# Patient Record
Sex: Male | Born: 1935
Health system: Southern US, Community
[De-identification: ages and names within clinical notes are randomized; demographics above are authoritative.]

## PROBLEM LIST (undated history)

## (undated) DIAGNOSIS — I10 Essential (primary) hypertension: Secondary | ICD-10-CM

## (undated) DIAGNOSIS — M1990 Unspecified osteoarthritis, unspecified site: Secondary | ICD-10-CM

## (undated) DIAGNOSIS — G20A1 Parkinson's disease without dyskinesia, without mention of fluctuations: Secondary | ICD-10-CM

## (undated) DIAGNOSIS — G4730 Sleep apnea, unspecified: Secondary | ICD-10-CM

## (undated) DIAGNOSIS — I959 Hypotension, unspecified: Secondary | ICD-10-CM

## (undated) DIAGNOSIS — G20 Parkinson's disease: Secondary | ICD-10-CM

## (undated) DIAGNOSIS — Z87442 Personal history of urinary calculi: Secondary | ICD-10-CM

## (undated) HISTORY — DX: Essential (primary) hypertension: I10

## (undated) HISTORY — PX: BACK SURGERY: SHX140

## (undated) HISTORY — PX: COLONOSCOPY: SHX174

## (undated) HISTORY — PX: ARM NEUROPLASTY: SHX1184

## (undated) HISTORY — PX: CARPAL TUNNEL RELEASE: SHX101

## (undated) HISTORY — PX: SHOULDER SURGERY: SHX246

---

## 1998-02-28 ENCOUNTER — Encounter: Admission: RE | Admit: 1998-02-28 | Discharge: 1998-05-29 | Payer: Self-pay | Admitting: *Deleted

## 2009-06-01 ENCOUNTER — Ambulatory Visit: Payer: Self-pay | Admitting: Diagnostic Radiology

## 2009-06-01 ENCOUNTER — Ambulatory Visit (HOSPITAL_BASED_OUTPATIENT_CLINIC_OR_DEPARTMENT_OTHER): Admission: RE | Admit: 2009-06-01 | Discharge: 2009-06-01 | Payer: Self-pay | Admitting: Orthopaedic Surgery

## 2009-10-06 ENCOUNTER — Inpatient Hospital Stay (HOSPITAL_COMMUNITY): Admission: RE | Admit: 2009-10-06 | Discharge: 2009-10-14 | Payer: Self-pay | Admitting: Neurosurgery

## 2009-10-10 ENCOUNTER — Ambulatory Visit: Payer: Self-pay | Admitting: Physical Medicine & Rehabilitation

## 2009-12-21 ENCOUNTER — Ambulatory Visit: Payer: Self-pay | Admitting: Radiology

## 2009-12-21 ENCOUNTER — Ambulatory Visit (HOSPITAL_BASED_OUTPATIENT_CLINIC_OR_DEPARTMENT_OTHER): Admission: RE | Admit: 2009-12-21 | Discharge: 2009-12-21 | Payer: Self-pay | Admitting: Neurosurgery

## 2010-05-25 ENCOUNTER — Ambulatory Visit (HOSPITAL_BASED_OUTPATIENT_CLINIC_OR_DEPARTMENT_OTHER): Admission: RE | Admit: 2010-05-25 | Discharge: 2010-05-25 | Payer: Self-pay | Admitting: Neurosurgery

## 2010-05-25 ENCOUNTER — Ambulatory Visit: Payer: Self-pay | Admitting: Diagnostic Radiology

## 2010-07-20 ENCOUNTER — Inpatient Hospital Stay (HOSPITAL_COMMUNITY): Admission: RE | Admit: 2010-07-20 | Discharge: 2010-07-26 | Payer: Self-pay | Admitting: Neurosurgery

## 2010-08-29 ENCOUNTER — Ambulatory Visit: Payer: Self-pay | Admitting: Pulmonary Disease

## 2010-08-29 DIAGNOSIS — I10 Essential (primary) hypertension: Secondary | ICD-10-CM | POA: Insufficient documentation

## 2010-08-29 HISTORY — DX: Essential (primary) hypertension: I10

## 2010-09-14 ENCOUNTER — Encounter: Payer: Self-pay | Admitting: Pulmonary Disease

## 2010-09-14 ENCOUNTER — Ambulatory Visit (HOSPITAL_BASED_OUTPATIENT_CLINIC_OR_DEPARTMENT_OTHER)
Admission: RE | Admit: 2010-09-14 | Discharge: 2010-09-14 | Payer: Self-pay | Source: Home / Self Care | Attending: Pulmonary Disease | Admitting: Pulmonary Disease

## 2010-09-24 DIAGNOSIS — G4730 Sleep apnea, unspecified: Secondary | ICD-10-CM

## 2010-09-24 HISTORY — DX: Sleep apnea, unspecified: G47.30

## 2010-09-30 ENCOUNTER — Telehealth: Payer: Self-pay | Admitting: Pulmonary Disease

## 2010-09-30 DIAGNOSIS — G4733 Obstructive sleep apnea (adult) (pediatric): Secondary | ICD-10-CM

## 2010-09-30 HISTORY — DX: Obstructive sleep apnea (adult) (pediatric): G47.33

## 2010-10-02 ENCOUNTER — Encounter: Payer: Self-pay | Admitting: Pulmonary Disease

## 2010-10-11 ENCOUNTER — Ambulatory Visit (HOSPITAL_BASED_OUTPATIENT_CLINIC_OR_DEPARTMENT_OTHER)
Admission: RE | Admit: 2010-10-11 | Discharge: 2010-10-11 | Payer: Self-pay | Source: Home / Self Care | Attending: Orthopaedic Surgery | Admitting: Orthopaedic Surgery

## 2010-10-16 ENCOUNTER — Encounter: Payer: Self-pay | Admitting: Pulmonary Disease

## 2010-10-17 ENCOUNTER — Ambulatory Visit: Admit: 2010-10-17 | Payer: Self-pay | Admitting: Pulmonary Disease

## 2010-10-19 ENCOUNTER — Encounter: Payer: Self-pay | Admitting: Pulmonary Disease

## 2010-10-20 LAB — SURGICAL PCR SCREEN
MRSA, PCR: NEGATIVE
Staphylococcus aureus: NEGATIVE

## 2010-10-20 LAB — COMPREHENSIVE METABOLIC PANEL
ALT: 16 U/L (ref 0–53)
AST: 17 U/L (ref 0–37)
Albumin: 3.7 g/dL (ref 3.5–5.2)
Alkaline Phosphatase: 71 U/L (ref 39–117)
BUN: 13 mg/dL (ref 6–23)
CO2: 30 mEq/L (ref 19–32)
Calcium: 9.3 mg/dL (ref 8.4–10.5)
Chloride: 102 mEq/L (ref 96–112)
Creatinine, Ser: 1.12 mg/dL (ref 0.4–1.5)
GFR calc Af Amer: >60 mL/min (ref >60)
GFR calc non Af Amer: >60 mL/min (ref >60)
Glucose, Bld: 91 mg/dL (ref 70–99)
Potassium: 3.9 mEq/L (ref 3.5–5.1)
Sodium: 141 mEq/L (ref 135–145)
Total Bilirubin: 0.4 mg/dL (ref 0.3–1.2)
Total Protein: 6.1 g/dL (ref 6.0–8.3)

## 2010-10-20 LAB — CBC
HCT: 39 % (ref 39.0–52.0)
Hemoglobin: 12.5 g/dL — ABNORMAL LOW (ref 13.0–17.0)
MCH: 29.3 pg (ref 26.0–34.0)
MCHC: 32.1 g/dL (ref 30.0–36.0)
MCV: 91.5 fL (ref 78.0–100.0)
Platelets: 157 10*3/uL (ref 150–400)
RBC: 4.26 MIL/uL (ref 4.22–5.81)
RDW: 14.3 % (ref 11.5–15.5)
WBC: 6.1 10*3/uL (ref 4.0–10.5)

## 2010-10-20 LAB — URINALYSIS, ROUTINE W REFLEX MICROSCOPIC
Bilirubin Urine: NEGATIVE
Hgb urine dipstick: NEGATIVE
Ketones, ur: NEGATIVE mg/dL
Nitrite: NEGATIVE
Protein, ur: NEGATIVE mg/dL
Specific Gravity, Urine: 1.019 (ref 1.005–1.030)
Urine Glucose, Fasting: NEGATIVE mg/dL
Urobilinogen, UA: 0.2 mg/dL (ref 0.0–1.0)
pH: 6 (ref 5.0–8.0)

## 2010-10-20 LAB — DIFFERENTIAL
Basophils Absolute: 0 10*3/uL (ref 0.0–0.1)
Basophils Relative: 1 % (ref 0–1)
Eosinophils Absolute: 0.2 10*3/uL (ref 0.0–0.7)
Eosinophils Relative: 3 % (ref 0–5)
Lymphocytes Relative: 25 % (ref 12–46)
Lymphs Abs: 1.5 10*3/uL (ref 0.7–4.0)
Monocytes Absolute: 0.5 10*3/uL (ref 0.1–1.0)
Monocytes Relative: 8 % (ref 3–12)
Neutro Abs: 3.9 10*3/uL (ref 1.7–7.7)
Neutrophils Relative %: 63 % (ref 43–77)

## 2010-10-20 LAB — URINE MICROSCOPIC-ADD ON

## 2010-10-20 LAB — PROTIME-INR
INR: 0.97 (ref 0.00–1.49)
Prothrombin Time: 13.1 seconds (ref 11.6–15.2)

## 2010-10-20 LAB — APTT: aPTT: 30 seconds (ref 24–37)

## 2010-10-24 ENCOUNTER — Ambulatory Visit (HOSPITAL_COMMUNITY)
Admission: RE | Admit: 2010-10-24 | Discharge: 2010-10-25 | Disposition: A | Discharge status: Home / Self Care | Payer: MEDICARE | Attending: Orthopaedic Surgery | Admitting: Orthopaedic Surgery

## 2010-10-24 DIAGNOSIS — I10 Essential (primary) hypertension: Secondary | ICD-10-CM | POA: Insufficient documentation

## 2010-10-24 DIAGNOSIS — M25819 Other specified joint disorders, unspecified shoulder: Secondary | ICD-10-CM | POA: Insufficient documentation

## 2010-10-24 DIAGNOSIS — G4733 Obstructive sleep apnea (adult) (pediatric): Secondary | ICD-10-CM | POA: Insufficient documentation

## 2010-10-24 DIAGNOSIS — M19019 Primary osteoarthritis, unspecified shoulder: Secondary | ICD-10-CM | POA: Insufficient documentation

## 2010-10-24 DIAGNOSIS — M67919 Unspecified disorder of synovium and tendon, unspecified shoulder: Secondary | ICD-10-CM | POA: Insufficient documentation

## 2010-10-24 DIAGNOSIS — M719 Bursopathy, unspecified: Secondary | ICD-10-CM | POA: Insufficient documentation

## 2010-10-24 NOTE — Assessment & Plan Note (Signed)
Summary: sleep consult/LC   Visit Type:  Initial Consult Copy to:  Dr. Maeola Harman  Primary Provider/Referring Provider:  Dr. Catha Gosselin  CC:  Sleep consult.  History of Present Illness: 77/M, hypertensive, chronic back pain for evaluation of obstructive sleep apnea  dr Venetia Maxon witnessed apneas post -op , he still takes hydromorphone 2 mg q 6h as needed inlcuding at bedtime, Lyrica  & amitryptiline for insomnia - has taken this for many yrs , He has just started on methacarbamol , did not tolerate flexeril Epworth Sleepiness Score 6 Takes afternoon nap on occasion x 40 minsto rest his back bedtime is 10-30 to MN, slep latency few mins, wakes up 2-3 times , BR x1, wakes up 0800 , unrefreshed , no dry mouth. Wife has noted snoring  apneas on few occasions  wt down 12 lbs  Preventive Screening-Counseling & Management  Alcohol-Tobacco     Alcohol drinks/day: 2-4     Alcohol type: all     Smoking Status: quit     Year Quit: 1980     Pipe use/week: 2-4   History of Present Illness: Did not know that I had one  What time do you typically go to bed?(between what hours): 10:30-12:00  How long does it take you to fall asleep? usually just a few minutes  How many times during the night do you wake up? maybe 2-3 times  What time do you get out of bed to start your day? 8:00-9:30a  Do you drive or operate heavy machinery in your occupation? no  How much has your weight changed (up or down) over the past two years? (in pounds): 12 lb decrease  Have you ever had a sleep study before?  If yes,when and where: no  Do you currently use CPAP ? If so , at what pressure? no  Do you wear oxygen at any time? If yes, how many liters per minute? no Current Medications (verified): 1)  Lyrica 150 Mg Caps (Pregabalin) .... Take 1 Capsule By Mouth Two Times A Day 2)  Amitriptyline Hcl 100 Mg Tabs (Amitriptyline Hcl) .... Take 1 Tab By Mouth At Bedtime 3)  Simvastatin 40 Mg Tabs (Simvastatin) ....  Take 1 Tab By Mouth At Bedtime 4)  Doxazosin Mesylate 4 Mg Tabs (Doxazosin Mesylate) .... Take 1 Tab By Mouth At Bedtime 5)  Fish Oil 1000 Mg Caps (Omega-3 Fatty Acids) .... Take 1 Tablet By Mouth Three Times A Day 6)  B Complex  Tabs (B Complex Vitamins) .... Take 1 Tablet By Mouth Once A Day 7)  Vitamin B-6 100 Mg Tabs (Pyridoxine Hcl) .... Take 1 Tablet By Mouth Once A Day 8)  L-Lysine 500 Mg Tabs (Lysine) .... Take 1 Tablet By Mouth Once A Day 9)  Flaxseed Oil 1000 Mg Caps (Flaxseed (Linseed)) .... Take 1 Tablet By Mouth Once A Day 10)  Vitamin D 2000 Unit Tabs (Cholecalciferol) .... Take 1 Tablet By Mouth Once A Day 11)  Multivitamins   Tabs (Multiple Vitamin) .... Take 1 Tablet By Mouth Once A Day 12)  Aspirin 81 Mg  Tabs (Aspirin) .... Take 1 Tablet By Mouth Once A Day 13)  Saw Palmetto 80 Mg Caps (Saw Palmetto (Serenoa Repens)) .... Take 1 Tablet By Mouth Three Times A Day 14)  Stool Softener 100 Mg Caps (Docusate Sodium) .... Take 1 Tablet By Mouth Three Times A Day 15)  Perdiem Overnight Relief 15 Mg Tabs (Sennosides) .... Take 1 Tablet By Mouth Four Times  A Day 16)  Cinnamon 500 Mg Caps (Cinnamon) .... Take 1 Tablet By Mouth Once A Day 17)  Hydromorphone Hcl 2 Mg Tabs (Hydromorphone Hcl) .... Take 1 Tablet By Mouth Two Times A Day 18)  Methocarbamol 750 Mg Tabs (Methocarbamol) .... Take 1 Tablet By Mouth Every 6 Hours As Needed 19)  Black Cherry 250mg  .... Take 1 Tablet By Mouth Once A Day  Allergies (verified): No Known Drug Allergies  Past History:  Past Medical History: Hypertension  Past Surgical History: Back surgery  Family History: Cancer  Social History: Marital Status: married Children: yes Occupation: Retired from [employer] in 1992 Patient states former smoker. (pipe smoker quit 1980) Smoking Status:  quit Pipe use/week:  2-4 Alcohol drinks/day:  2-4  Review of Systems       The patient complains of hand/feet swelling and joint stiffness or pain.  The  patient denies shortness of breath with activity, shortness of breath at rest, productive cough, non-productive cough, coughing up blood, chest pain, irregular heartbeats, acid heartburn, indigestion, loss of appetite, weight change, abdominal pain, difficulty swallowing, sore throat, tooth/dental problems, headaches, nasal congestion/difficulty breathing through nose, sneezing, itching, ear ache, anxiety, depression, rash, change in color of mucus, and fever.    Vital Signs:  Patient profile:   77 year old male Height:      70.5 inches Weight:      183 pounds BMI:     25.98 O2 Sat:      100 % on Room air Temp:     98.6 degrees F oral Pulse rate:   82 / minute BP sitting:   102 / 74  (left arm) Cuff size:   regular  Vitals Entered By: Zackery Barefoot CMA (August 29, 2010 2:05 PM)  O2 Flow:  Room air CC: Sleep consult Comments Medications reviewed with patient Verified contact number and pharmacy with patient Zackery Barefoot CMA  August 29, 2010 2:06 PM    Physical Exam  Additional Exam:  Gen. Pleasant, well-nourished, in no distress, normal affect ENT - no lesions, no post nasal drip, class 2 airway Neck: No JVD, no thyromegaly, no carotid bruits Lungs: no use of accessory muscles, no dullness to percussion, clear without rales or rhonchi  Cardiovascular: Rhythm regular, heart sounds  normal, no murmurs or gallops, no peripheral edema Abdomen: soft and non-tender, no hepatosplenomegaly, BS normal. Musculoskeletal: No deformities, no cyanosis or clubbing Neuro:  alert, non focal     Impression & Recommendations:  Problem # 1:  SLEEP DISORDER (ICD-780.50) Due to witnessed apneas durign hospital stay & by wife, will proceed with PSG Concern here is for central apneas due to narcotics & age related sleep disordered breathing The pathophysiology of obstructive sleep apnea, it's cardiovascular consequences and modes of treatment including CPAP were discussed with the patient  in great detail.  Orders: Consultation Level III (09811) Sleep Disorder Referral (Sleep Disorder)  Medications Added to Medication List This Visit: 1)  Lyrica 150 Mg Caps (Pregabalin) .... Take 1 capsule by mouth two times a day 2)  Amitriptyline Hcl 100 Mg Tabs (Amitriptyline hcl) .... Take 1 tab by mouth at bedtime 3)  Simvastatin 40 Mg Tabs (Simvastatin) .... Take 1 tab by mouth at bedtime 4)  Doxazosin Mesylate 4 Mg Tabs (Doxazosin mesylate) .... Take 1 tab by mouth at bedtime 5)  Fish Oil 1000 Mg Caps (Omega-3 fatty acids) .... Take 1 tablet by mouth three times a day 6)  B Complex Tabs (B complex  vitamins) .... Take 1 tablet by mouth once a day 7)  Vitamin B-6 100 Mg Tabs (Pyridoxine hcl) .... Take 1 tablet by mouth once a day 8)  L-lysine 500 Mg Tabs (Lysine) .... Take 1 tablet by mouth once a day 9)  Flaxseed Oil 1000 Mg Caps (Flaxseed (linseed)) .... Take 1 tablet by mouth once a day 10)  Vitamin D 2000 Unit Tabs (Cholecalciferol) .... Take 1 tablet by mouth once a day 11)  Multivitamins Tabs (Multiple vitamin) .... Take 1 tablet by mouth once a day 12)  Aspirin 81 Mg Tabs (Aspirin) .... Take 1 tablet by mouth once a day 13)  Saw Palmetto 80 Mg Caps (Saw palmetto (serenoa repens)) .... Take 1 tablet by mouth three times a day 14)  Stool Softener 100 Mg Caps (Docusate sodium) .... Take 1 tablet by mouth three times a day 15)  Perdiem Overnight Relief 15 Mg Tabs (Sennosides) .... Take 1 tablet by mouth four times a day 16)  Cinnamon 500 Mg Caps (Cinnamon) .... Take 1 tablet by mouth once a day 17)  Hydromorphone Hcl 2 Mg Tabs (Hydromorphone hcl) .... Take 1 tablet by mouth two times a day 18)  Methocarbamol 750 Mg Tabs (Methocarbamol) .... Take 1 tablet by mouth every 6 hours as needed 19)  Black Cherry 250mg   .... Take 1 tablet by mouth once a day  Patient Instructions: 1)  Copy sent to: Dr Venetia Maxon, Dr Clarene Duke  2)  Please schedule a follow-up appointment in 2 weeks after sleep  study   Immunization History:  Influenza Immunization History:    Influenza:  historical (05/29/2010)  Pneumovax Immunization History:    Pneumovax:  historical (08/26/2006)

## 2010-10-25 ENCOUNTER — Encounter: Payer: Self-pay | Admitting: Orthopaedic Surgery

## 2010-10-26 NOTE — Progress Notes (Addendum)
Summary: CPAP Auto  Phone Note Outgoing Call   Reason for Call: Discuss lab or test results Summary of Call: PSG showed severe obstructive sleep apnea - stopped breathing 40 /hr, Rx for auto cpap has been sent. further discussion on fu visit Initial call taken by: Comer Locket. Vassie Loll MD,  September 30, 2010 10:06 PM  Follow-up for Phone Call        lmomtcb x 1. Zackery Barefoot [facility]  October 03, 2010 10:31 AM  lmomtcb x 2 Zackery Barefoot [facility]  October 12, 2010 11:29 AM   Informed pt of above. Pt stated since he had not heard from "[place]" he declined to be set up. [facility] can you guys resend it or do we need a new Rx for the auto? Thanks. Zackery Barefoot CMA  October 12, 2010 3:08 PM   Additional Follow-up for Phone Call Additional follow up Details #1::        spoke to pt he says he never spoke to dr Vassie Loll about his sleep study i explained everything to him and he is now going to let [facility] set up cpap and will flu with dr Vassie Loll 11/30/10 Additional Follow-up by: Oneita Jolly,  October 12, 2010 4:01 PM  New Problems: OBSTRUCTIVE SLEEP APNEA (ICD-327.23)   Additional Follow-up for Phone Call Additional follow up Details #2::    In general, pl do not send Rx for CPAP until phone note mentions pt has been informed about PSg results Follow-up by: Comer Locket. Vassie Loll MD,  October 12, 2010 11:14 PM  New Problems: OBSTRUCTIVE SLEEP APNEA (ICD-327.23)  Appended Document: CPAP Auto dr Vassie Loll in the future just so you will know when the order goes into pcc box we have now way of knowing if the pt has been informed by your nurse or not so we might want to havbe her put the order inthe pcc box after she has spoken to the pt, because we do our ours daily in order to be able to keep up with so many docs orders

## 2010-10-26 NOTE — Miscellaneous (Signed)
Summary: Insurance/[employer]  Insurance/[employer]   Imported By: Lester [place] 10/20/2010 09:06:28  _____________________________________________________________________  External Attachment:    Type:   Image     Comment:   External Document

## 2010-11-01 ENCOUNTER — Ambulatory Visit: Payer: MEDICARE | Attending: Orthopaedic Surgery | Admitting: Physical Therapy

## 2010-11-01 DIAGNOSIS — M25619 Stiffness of unspecified shoulder, not elsewhere classified: Secondary | ICD-10-CM | POA: Insufficient documentation

## 2010-11-01 DIAGNOSIS — IMO0001 Reserved for inherently not codable concepts without codable children: Secondary | ICD-10-CM | POA: Insufficient documentation

## 2010-11-01 DIAGNOSIS — R293 Abnormal posture: Secondary | ICD-10-CM | POA: Insufficient documentation

## 2010-11-01 DIAGNOSIS — M25519 Pain in unspecified shoulder: Secondary | ICD-10-CM | POA: Insufficient documentation

## 2010-11-01 NOTE — Miscellaneous (Signed)
Summary: Set up CPAP/[facility]  Set up CPAP/[facility]   Imported By: Lester [place] 10/26/2010 08:52:02  _____________________________________________________________________  External Attachment:    Type:   Image     Comment:   External Document

## 2010-11-03 ENCOUNTER — Ambulatory Visit: Payer: MEDICARE | Admitting: Physical Therapy

## 2010-11-03 NOTE — Op Note (Signed)
[name], [name]               ACCOUNT NO.:  [id number]  MEDICAL RECORD NO.:  [id number]          PATIENT TYPE:  OIB  LOCATION:  5020                         FACILITY:  [facility]  PHYSICIAN:  Claude Manges. Whitfield, M.D.DATE OF BIRTH:  1932/11/24  DATE OF PROCEDURE:  10/24/2010 DATE OF DISCHARGE:                              OPERATIVE REPORT   PREOPERATIVE DIAGNOSES: 1. Impingement syndrome of left shoulder with partial rotator cuff     tear. 2. Tear of biceps tendon. 3. Degenerative arthrosis of acromioclavicular joint.  POSTOPERATIVE DIAGNOSIS: 1. Impingement syndrome of left shoulder with partial rotator cuff     tear. 2. Tear of biceps tendon. 3. Degenerative arthrosis of acromioclavicular joint.  PROCEDURES: 1. Diagnostic arthroscopy of left shoulder with debridement of     glenohumeral joint. 2. Arthroscopic subacromial decompression. 3. Arthroscopic distal clavicle resection. 4. Open biceps tenodesis.  SURGEON:  Claude Manges. Cleophas Dunker, MD  ASSISTANT:  Oris Drone. Petrarca, PA-C  ANESTHESIA:  General with supplemental interscalene nerve block.  COMPLICATIONS:  None.  HISTORY:  [name] was evaluated approximately a month ago with a 2- to 69-month history of left shoulder pain.  He has had some difficulty raising his arm overhead associated with popping and catching.  He has had a prior right shoulder debridement and was concerned that he may have developed a rotator cuff tear with significant problem and accordingly, he visited the office.  Because of his previous problem, we ordered an MRI scan.  Scan revealed rotator cuff tendinopathy with a partial-thickness articular surface tear of the subscapularis, biceps tendinopathy with longitudinal tearing, a small shoulder joint effusion with a subacromial bursitis and moderate degenerative changes of the AC joint.  He wishes to proceed with arthroscopic evaluation given the past history with his right  shoulder.  PROCEDURE:  Mr. [name] was met in the holding area.  I marked the left shoulder as the appropriate operative site.  He did receive a preoperative interscalene nerve block.  Mr. [name] was then transported to room #1 and placed under general orotracheal anesthesia, he did receive a preoperative interscalene nerve block.  The left shoulder was then evaluated without evidence of instability or adhesive capsulitis.  He does have a fixed flexure contracture of his left elbow from a prior injury.  He was then placed in a semi-sitting position with the shoulder frame.  The left upper extremities were then prepped with DuraPrep and the base of neck circumferentially, and below the elbow.  Sterile draping was performed.  A marking pen was used to outline the [facility] joint, the coracoid, and the acromion.  At a point a fingerbreadth posteromedial to the posterior angle of acromion, a small stab wound was made.  The arthroscope was easily placed in the shoulder joint.  Diagnostic arthroscopy revealed diffuse synovitis.  There was some partial tearing of the subscapularis and tearing of the biceps tendon that seemed to involve at least 50% of the tendon.  Second portal was established anteriorly and shaving of the synovitis was performed with the ArthroCare wand.  There were some degenerative changes of the humeral head that were debrided with a  Cuda shaver.  There appeared to be grade 2 changes although the surface of the head was irregular in several areas.  The arthroscope was then placed in subacromial space posteriorly, the cannula in subacromial space anteriorly, and a third portal established in lateral subacromial space and arthroscopic subacromial decompression was performed removing considerable inflammatory bursal material.  There was obvious anterior overhang of the acromion and an anterior acromioplasty was performed.  There was some overhang laterally which was debrided as  well.  There was considerable osteophyte formation at the [facility] joint of both sides.  I removed the osteophyte from the acromion so I could better visualize the end of the clavicle and there was little if any articular cartilage and any degenerative capsule.  A distal clavicle resection was then performed with a 6-mm bur with a nice resection.  Any synovitis was removed with the ArthroCare wand.  I did not see an obvious rotator cuff tear.  Based on the appearance of the biceps tendon and given the MRI scan results of biceps, open biceps tenodesis was performed.  A little over an inch incision was made along the anterior aspect of the shoulder in line of the biceps tendon.  Via sharp dissection, incision was carried down to subcutaneous tissue.  Gross bleeders were Bovie coagulated. Deltoid fascia was then incised and separated and maintained with a self- retaining retractor.  The biceps tendon groove was identified, it was incised with the Bovie.  The tendon was then delivered through the wound, was quite thick and no obvious longitudinal tears.  Suture was tagged at its normal length and then removed from its attachment to the superior aspect of the glenoid.  A single 5.5-mm Arthrex PEEK anchor was then inserted into the inferior aspect of the biceps groove.  Two FiberWires were then placed through the tendon and then secured into the groove.  The sutures were incised with the knife and then the remaining redundant biceps tendon was incised with a knife.  I supplemented the repair with 0 Vicryl suture through the retinaculum and through the tendon.  The remaining soft tissue retinaculum was closed with running #1 Vicryl.  I did evaluate the rotator cuff and it appeared to be intact.  The wound was then irrigated with saline solution.  The deltoid fascia closed with a running 0 Vicryl, subcu with 3-0 Monocryl, skin closed with Steri-Strips over Benzoin.  Sterile bulky dressing was  applied followed by a sling.  The patient tolerated the procedure without complications.  PLAN:  A 23-hour observation based on his history of sleep apnea. Discharge in a.m. on Dilaudid for pain.     Claude Manges. Cleophas Dunker, M.D.     PWW/MEDQ  D:  10/24/2010  T:  10/25/2010  Job:  [id number]  Electronically Signed by Norlene Campbell M.D. on 11/03/2010 01:30:32 PM

## 2010-11-07 ENCOUNTER — Ambulatory Visit: Payer: MEDICARE | Admitting: Physical Therapy

## 2010-11-10 ENCOUNTER — Ambulatory Visit: Payer: MEDICARE | Admitting: Physical Therapy

## 2010-11-14 ENCOUNTER — Ambulatory Visit: Payer: MEDICARE | Admitting: Physical Therapy

## 2010-11-14 NOTE — Discharge Summary (Signed)
  NAMEBALEN, Aaron Schmidt               ACCOUNT NO.:  [id number]  MEDICAL RECORD NO.:  [id number]          PATIENT TYPE:  INP  LOCATION:  3031                         FACILITY:  [facility]  PHYSICIAN:  Danae Orleans. Venetia Maxon, M.D.  DATE OF BIRTH:  21-Feb-1933  DATE OF ADMISSION:  07/20/2010 DATE OF DISCHARGE:  07/26/2010                              DISCHARGE SUMMARY   REASON FOR ADMISSION:  L3-4 pseudoarthrosis status post anterolateral fusion, spondylosis, scoliosis, and back pain.  FINAL DIAGNOSES:  L3-4 pseudoarthrosis status post anterolateral fusion, spondylosis, scoliosis, and back pain.  ADDITIONAL DIAGNOSES: 1. Delirium. 2. Obstructive sleep apnea.  HISTORY OF PRESENT ILLNESS AND HOSPITAL COURSE:  Aaron Schmidt is a 77- year-old man who is 10 months after anterolateral decompression and fusion at L3-4.  He has had a previous laminectomy posteriorly and fusion at L4-3 to sacrum and had continued back pain in his postoperative period was found on CT scan to have incomplete healing at the site of the previous surgery.  The patient was admitted to the hospital and underwent uncomplicated posterior pedicle screw fixation and posterolateral arthrodesis.  He had some back, right thigh, and bilateral thigh and bilateral knee pain which has gradually improved. He had postoperative confusion.  He did have some sleep apnea, and he was arranged to have a sleep study as an outpatient.  He was discharged home on his preoperative medications of docusate, aspirin 81 mg, multivitamin, vitamin D 2000 IU daily, vitamin B6, methocarbamol, oral hydromorphone for pain management.  The patient will need doxazosin 4 mg daily, Zocor 40 mg daily, amitriptyline 100 mg daily, Lyrica 150 mg twice daily.  Instructions were to follow up in the office in 3 weeks postoperatively.     Danae Orleans. Venetia Maxon, M.D.     JDS/MEDQ  D:  11/13/2010  T:  11/13/2010  Job:  [id number]  Electronically Signed by Maeola Harman M.D.  on 11/14/2010 04:51:53 PM

## 2010-11-16 ENCOUNTER — Encounter: Payer: MEDICARE | Admitting: Physical Therapy

## 2010-11-16 ENCOUNTER — Telehealth: Payer: Self-pay | Admitting: Pulmonary Disease

## 2010-11-17 ENCOUNTER — Encounter: Payer: MEDICARE | Admitting: Physical Therapy

## 2010-11-21 ENCOUNTER — Ambulatory Visit: Payer: MEDICARE | Admitting: Physical Therapy

## 2010-11-23 ENCOUNTER — Encounter: Payer: Self-pay | Admitting: Pulmonary Disease

## 2010-11-23 ENCOUNTER — Ambulatory Visit: Payer: MEDICARE | Attending: Orthopaedic Surgery | Admitting: Physical Therapy

## 2010-11-23 DIAGNOSIS — R293 Abnormal posture: Secondary | ICD-10-CM | POA: Insufficient documentation

## 2010-11-23 DIAGNOSIS — IMO0001 Reserved for inherently not codable concepts without codable children: Secondary | ICD-10-CM | POA: Insufficient documentation

## 2010-11-23 DIAGNOSIS — M25519 Pain in unspecified shoulder: Secondary | ICD-10-CM | POA: Insufficient documentation

## 2010-11-23 DIAGNOSIS — M25619 Stiffness of unspecified shoulder, not elsewhere classified: Secondary | ICD-10-CM | POA: Insufficient documentation

## 2010-11-28 ENCOUNTER — Encounter: Payer: MEDICARE | Admitting: Rehabilitation

## 2010-11-30 ENCOUNTER — Encounter: Payer: Self-pay | Admitting: Pulmonary Disease

## 2010-11-30 NOTE — Progress Notes (Signed)
  Phone Note Call from Patient   Caller: [facility] Call For: Aaron Schmidt Summary of Call: order for auto cpap was given 09/30/10- pt has informed them he has not been using the cpap because he had surgery on his shoulder and his arm was in a sling so he could not get the mask on good .he is now using it again and has an appt 12/04/10 do you want to move his appt further out to get the 4wk download or a shorter download? Initial call taken by: Oneita Jolly,  November 16, 2010 2:38 PM  Follow-up for Phone Call        shorter download OK - send in card 2 days prior to appt Follow-up by: Comer Locket. Vassie Loll MD,  November 18, 2010 8:00 PM  Additional Follow-up for Phone Call Additional follow up Details #1::        informed [email or web address]  to download 2day before appt  Additional Follow-up by: Oneita Jolly,  November 20, 2010 8:26 AM

## 2010-12-01 ENCOUNTER — Ambulatory Visit: Payer: MEDICARE | Admitting: Rehabilitation

## 2010-12-05 ENCOUNTER — Ambulatory Visit (INDEPENDENT_AMBULATORY_CARE_PROVIDER_SITE_OTHER): Payer: MEDICARE | Admitting: Pulmonary Disease

## 2010-12-05 ENCOUNTER — Encounter: Payer: Self-pay | Admitting: Pulmonary Disease

## 2010-12-05 ENCOUNTER — Ambulatory Visit: Payer: MEDICARE | Admitting: Rehabilitation

## 2010-12-05 DIAGNOSIS — G4733 Obstructive sleep apnea (adult) (pediatric): Secondary | ICD-10-CM

## 2010-12-06 LAB — CBC
HCT: 40.8 % (ref 39.0–52.0)
Hemoglobin: 13.3 g/dL (ref 13.0–17.0)
MCH: 30.9 pg (ref 26.0–34.0)
MCHC: 32.6 g/dL (ref 30.0–36.0)
MCV: 94.9 fL (ref 78.0–100.0)
Platelets: 145 10*3/uL — ABNORMAL LOW (ref 150–400)
RBC: 4.3 MIL/uL (ref 4.22–5.81)
RDW: 15.1 % (ref 11.5–15.5)
WBC: 5.1 10*3/uL (ref 4.0–10.5)

## 2010-12-06 LAB — BASIC METABOLIC PANEL
BUN: 15 mg/dL (ref 6–23)
CO2: 31 mEq/L (ref 19–32)
Calcium: 9.4 mg/dL (ref 8.4–10.5)
Chloride: 109 mEq/L (ref 96–112)
Creatinine, Ser: 1.22 mg/dL (ref 0.4–1.5)
GFR calc Af Amer: >60 mL/min (ref >60)
GFR calc non Af Amer: 58 mL/min — ABNORMAL LOW (ref >60)
Glucose, Bld: 90 mg/dL (ref 70–99)
Potassium: 5 mEq/L (ref 3.5–5.1)
Sodium: 144 mEq/L (ref 135–145)

## 2010-12-06 LAB — TYPE AND SCREEN
ABO/RH(D): A POS
Antibody Screen: NEGATIVE

## 2010-12-06 LAB — SURGICAL PCR SCREEN
MRSA, PCR: NEGATIVE
Staphylococcus aureus: NEGATIVE

## 2010-12-08 ENCOUNTER — Ambulatory Visit: Payer: MEDICARE | Admitting: Physical Therapy

## 2010-12-10 LAB — CBC
HCT: 40.2 % (ref 39.0–52.0)
Hemoglobin: 13.8 g/dL (ref 13.0–17.0)
MCHC: 34.5 g/dL (ref 30.0–36.0)
MCV: 97.7 fL (ref 78.0–100.0)
Platelets: 137 10*3/uL — ABNORMAL LOW (ref 150–400)
RBC: 4.11 MIL/uL — ABNORMAL LOW (ref 4.22–5.81)
RDW: 14.1 % (ref 11.5–15.5)
WBC: 5.8 10*3/uL (ref 4.0–10.5)

## 2010-12-10 LAB — BASIC METABOLIC PANEL
BUN: 14 mg/dL (ref 6–23)
CO2: 29 mEq/L (ref 19–32)
Calcium: 9.4 mg/dL (ref 8.4–10.5)
Chloride: 104 mEq/L (ref 96–112)
Creatinine, Ser: 1.09 mg/dL (ref 0.4–1.5)
GFR calc Af Amer: >60 mL/min (ref >60)
GFR calc non Af Amer: >60 mL/min (ref >60)
Glucose, Bld: 90 mg/dL (ref 70–99)
Potassium: 4 mEq/L (ref 3.5–5.1)
Sodium: 140 mEq/L (ref 135–145)

## 2010-12-10 LAB — TYPE AND SCREEN
ABO/RH(D): A POS
Antibody Screen: NEGATIVE

## 2010-12-10 LAB — ABO/RH: ABO/RH(D): A POS

## 2010-12-12 ENCOUNTER — Ambulatory Visit: Payer: MEDICARE | Admitting: Physical Therapy

## 2010-12-12 NOTE — Assessment & Plan Note (Signed)
Summary: return office visit   Visit Type:  Follow-up Copy to:  Dr. Maeola Harman  Primary Provider/Referring Provider:  Dr. Catha Gosselin  CC:  Pt here for OSA follow up. Pt c/o nasal congestion and nosebleeds. Unable to use machine often due to back and shoulder surgery.  History of Present Illness: 77/M, hypertensive, chronic back pain for evaluation of obstructive sleep apnea  dr Venetia Maxon witnessed apneas post -op , he still takes hydromorphone 2 mg q 6h as needed inlcuding at bedtime, Lyrica  & amitryptiline for insomnia - has taken this for many yrs , He has just started on methacarbamol , did not tolerate flexeril Epworth Sleepiness Score 6 Takes afternoon nap on occasion x 40 minsto rest his back Wife has noted snoring , apneas on few occasions  wt down 12 lbs  December 05, 2010 1:50 PM  c/o nosebleeds, change to nasal mask -received today Reviewed PSG (180 lbs ) >> severe obstructive sleep apnea with AHi 38/h & desatns to 82% Started on auto 5-15 pressure Ok, no dryness Download reviewed >> residual events of 6/h , avg pr 8 cm, leak + with nasal pillows   Preventive Screening-Counseling & Management  Alcohol-Tobacco     Alcohol drinks/day: 2-4     Alcohol type: all     Smoking Status: quit     Year Quit: 1980     Pipe use/week: 2-4  Current Medications (verified): 1)  Lyrica 150 Mg Caps (Pregabalin) .... Take 1 Capsule By Mouth Two Times A Day 2)  Amitriptyline Hcl 100 Mg Tabs (Amitriptyline Hcl) .... Take 1 Tab By Mouth At Bedtime 3)  Simvastatin 40 Mg Tabs (Simvastatin) .... Take 1 Tab By Mouth At Bedtime 4)  Doxazosin Mesylate 4 Mg Tabs (Doxazosin Mesylate) .... Take 1 Tab By Mouth At Bedtime 5)  Fish Oil 1000 Mg Caps (Omega-3 Fatty Acids) .... Take 1 Tablet By Mouth Three Times A Day 6)  B Complex  Tabs (B Complex Vitamins) .... Take 1 Tablet By Mouth Once A Day 7)  Vitamin B-6 100 Mg Tabs (Pyridoxine Hcl) .... Take 1 Tablet By Mouth Once A Day 8)  L-Lysine 500 Mg Tabs  (Lysine) .... Take 1 Tablet By Mouth Once A Day 9)  Flaxseed Oil 1000 Mg Caps (Flaxseed (Linseed)) .... Take 1 Tablet By Mouth Once A Day 10)  Vitamin D 2000 Unit Tabs (Cholecalciferol) .... Take 1 Tablet By Mouth Once A Day 11)  Multivitamins   Tabs (Multiple Vitamin) .... Take 1 Tablet By Mouth Once A Day 12)  Aspirin 81 Mg  Tabs (Aspirin) .... Take 1 Tablet By Mouth Once A Day 13)  Saw Palmetto 80 Mg Caps (Saw Palmetto (Serenoa Repens)) .... Take 1 Tablet By Mouth Three Times A Day 14)  Stool Softener 100 Mg Caps (Docusate Sodium) .... Take 1 Tablet By Mouth Three Times A Day 15)  Perdiem Overnight Relief 15 Mg Tabs (Sennosides) .... Take 1 Tablet By Mouth Four Times A Day 16)  Cinnamon 500 Mg Caps (Cinnamon) .... Take 1 Tablet By Mouth Once A Day 17)  Hydromorphone Hcl 2 Mg Tabs (Hydromorphone Hcl) .... Take 1 Tablet By Mouth Two Times A Day 18)  Methocarbamol 750 Mg Tabs (Methocarbamol) .... Take 1 Tablet By Mouth Every 6 Hours As Needed 19)  Black Cherry 250mg  .... Take 1 Tablet By Mouth Once A Day 20)  Mobic 7.5 Mg Tabs (Meloxicam) .... Take 1 Tablet By Mouth Two Times A Day  Allergies (verified): 1)  !  Valium  Past History:  Past Medical History: Last updated: 08/29/2010 Hypertension  Social History: Last updated: 08/29/2010 Marital Status: married Children: yes Occupation: Retired from [employer] in 1992 Patient states former smoker. (pipe smoker quit 1980)  Past Surgical History: Back surgery shoulder surgery-left  Review of Systems  The patient denies anorexia, fever, weight loss, weight gain, vision loss, decreased hearing, hoarseness, chest pain, syncope, dyspnea on exertion, peripheral edema, prolonged cough, headaches, hemoptysis, abdominal pain, melena, hematochezia, severe indigestion/heartburn, hematuria, muscle weakness, suspicious skin lesions, difficulty walking, depression, unusual weight change, abnormal bleeding, enlarged lymph nodes, and angioedema.    Vital  Signs:  Patient profile:   77 year old male Height:      70.5 inches Weight:      195.5 pounds BMI:     27.75 O2 Sat:      99 % on Room air Temp:     98.5 degrees F oral Pulse rate:   76 / minute BP sitting:   136 / 82  (right arm) Cuff size:   regular  Vitals Entered By: Zackery Barefoot CMA (December 05, 2010 1:38 PM)  O2 Flow:  Room air CC: Pt here for OSA follow up. Pt c/o nasal congestion and nosebleeds. Unable to use machine often due to back and shoulder surgery Comments Medications reviewed with patient Verified contact number and pharmacy with patient Zackery Barefoot CMA  December 05, 2010 1:41 PM    Physical Exam  Additional Exam:  Gen. Pleasant, well-nourished, in no distress, normal affect ENT - no lesions, no post nasal drip, class 2 airway Neck: No JVD, no thyromegaly, no carotid bruits Lungs: no use of accessory muscles, no dullness to percussion, clear without rales or rhonchi  Cardiovascular: Rhythm regular, heart sounds  normal, no murmurs or gallops, no peripheral edema Musculoskeletal: No deformities, no cyanosis or clubbing      Impression & Recommendations:  Problem # 1:  OBSTRUCTIVE SLEEP APNEA (ICD-327.23) Change to nasal mask , fixed pr 8 cm , rechk download in Compliance encouraged, wt loss emphasized, asked to avoid meds with sedative side effects, cautioned against driving when sleepy.  Orders: Est. Patient Level III (56213) DME Referral (DME)  Medications Added to Medication List This Visit: 1)  Mobic 7.5 Mg Tabs (Meloxicam) .... Take 1 tablet by mouth two times a day  Patient Instructions: 1)  Copy sent to: 2)  Please schedule a follow-up appointment in 1 month after you turn in your card 3)  We will change to a fixed pressure of 8 cm

## 2010-12-15 ENCOUNTER — Ambulatory Visit: Payer: MEDICARE | Admitting: Physical Therapy

## 2010-12-19 ENCOUNTER — Encounter: Payer: MEDICARE | Admitting: Physical Therapy

## 2010-12-22 ENCOUNTER — Encounter: Payer: MEDICARE | Admitting: Physical Therapy

## 2011-01-01 ENCOUNTER — Encounter: Payer: Self-pay | Admitting: Pulmonary Disease

## 2011-01-02 ENCOUNTER — Ambulatory Visit (INDEPENDENT_AMBULATORY_CARE_PROVIDER_SITE_OTHER): Payer: MEDICARE | Admitting: Pulmonary Disease

## 2011-01-02 ENCOUNTER — Encounter: Payer: Self-pay | Admitting: Pulmonary Disease

## 2011-01-02 VITALS — BP 142/78 | HR 83 | Temp 98.7°F | Ht 70.5 in | Wt 195.0 lb

## 2011-01-02 DIAGNOSIS — G4733 Obstructive sleep apnea (adult) (pediatric): Secondary | ICD-10-CM

## 2011-01-02 NOTE — Progress Notes (Signed)
  Subjective:    Patient ID: Aaron Schmidt, male    DOB: October 11, 1932, 77 y.o.   MRN: [id number]  HPI 77/M, hypertensive, chronic back pain for fu of obstructive sleep apnea  dr Venetia Maxon witnessed apneas post -op , he still takes hydromorphone 2 mg q 6h as needed inlcuding at bedtime, Lyrica  & amitryptiline for insomnia - has taken this for many yrs , He has just started on methacarbamol , did not tolerate flexeril Epworth Sleepiness Score 6 Takes afternoon nap on occasion x 40 minsto rest his back Wife has noted snoring , apneas on few occasions  wt down 12 lbs Reviewed PSG (180 lbs ) >> severe obstructive sleep apnea with [facility] 38/h & desatns to 82% Started on auto 5-15 march '12 pressure Ok, no dryness Download reviewed >> residual events of 6/h , avg pr 8 cm, leak + with nasal pillows  01/02/2011 Nasal breakdown, nasal pillows caused nose bleeds Download on 8 cm 3/15- 12/28/10 >> no residual events, good usage Unfortunately he does not report improvement in energy levels    Review of SystemsPt denies any significant  nasal congestion or excess secretions, fever, chills, sweats, unintended wt loss, pleuritic or exertional cp, orthopnea pnd or leg swelling.  Pt also denies any obvious fluctuation in symptoms with weather or environmental change or other alleviating or aggravating factors.    Pt denies any increase in rescue therapy over baseline, denies waking up needing it or having early am exacerbations or coughing/wheezing/ or dyspnea       Objective:   Physical ExamGen. Pleasant, well-nourished, in no distress ENT - no lesions, no post nasal drip Neck: No JVD, no thyromegaly, no carotid bruits Lungs: no use of accessory muscles, no dullness to percussion, clear without rales or rhonchi  Cardiovascular: Rhythm regular, heart sounds  normal, no murmurs or gallops, no peripheral edema Musculoskeletal: No deformities, no cyanosis or clubbing          Assessment & Plan:

## 2011-01-02 NOTE — Patient Instructions (Addendum)
Nasal pads Rx sent to [facility] - if they are unable to provide, pl contact [facility] Alternate pillows with nasal mask

## 2011-01-03 ENCOUNTER — Encounter: Payer: Self-pay | Admitting: Pulmonary Disease

## 2011-01-03 NOTE — Assessment & Plan Note (Signed)
He has had adjustment issues - bleding with pillows & nasal bridge skin breakdown with mask. Compliance is good as are control of events on cpap downloads, however he has not experienced subjective benefit & is discouraged by that. I have explained cardiac benefits of CPAP & asked him to persist. Nasal pad to avoid breakdown or alternate pillows with mask have been offered as solutions. Also feel that his narcotics & benzos do contribute to his daytime fatigue - he does seem to have chronic pain & needs this apparently Weight loss encouraged, compliance with goal of at least 4-6 hrs every night is the expectation. Advised against medications with sedative side effects Cautioned against driving when sleepy - understanding that sleepiness will vary on a day to day basis

## 2011-01-09 ENCOUNTER — Encounter: Payer: Self-pay | Admitting: Pulmonary Disease

## 2011-02-20 ENCOUNTER — Telehealth: Payer: Self-pay | Admitting: Pulmonary Disease

## 2011-02-20 DIAGNOSIS — G4730 Sleep apnea, unspecified: Secondary | ICD-10-CM

## 2011-02-20 NOTE — Telephone Encounter (Signed)
Libby or [place], pls help with this thanks

## 2011-02-20 NOTE — Telephone Encounter (Signed)
i'm not sure what this pt is talking about please explain to me could this be comfort pads

## 2011-02-21 NOTE — Telephone Encounter (Signed)
Spoke with pt- sounds like yes, needs comfort pads b/c he states that his his mask makes his nose sore and [facility] told him he would order pads to help better protect his nose.

## 2011-02-22 NOTE — Telephone Encounter (Signed)
Please place referral in pcc box for [facility] to provide comfort pads for CPAP. Thanks,

## 2011-02-22 NOTE — Telephone Encounter (Signed)
Order placed. Jennifer Castillo, CMA  

## 2011-03-13 ENCOUNTER — Ambulatory Visit: Payer: Medicare Other | Admitting: Pulmonary Disease

## 2011-03-13 ENCOUNTER — Ambulatory Visit (INDEPENDENT_AMBULATORY_CARE_PROVIDER_SITE_OTHER): Payer: Medicare Other | Admitting: Adult Health

## 2011-03-13 ENCOUNTER — Encounter: Payer: Self-pay | Admitting: Adult Health

## 2011-03-13 VITALS — BP 124/80 | HR 78 | Temp 98.9°F | Ht 70.5 in | Wt 195.0 lb

## 2011-03-13 DIAGNOSIS — G4733 Obstructive sleep apnea (adult) (pediatric): Secondary | ICD-10-CM

## 2011-03-13 NOTE — Patient Instructions (Addendum)
Use Saline nasal rinses and gel Twice daily   Use Nasal pad with mask over next week and if not helping we can look at another mask fitting with [facility].  Call if any more problems with mask.  Weight loss encouraged, compliance with goal of at least 4-6 hrs every night is the expectation.  Advised against medications with sedative side effects  Cautioned against driving when sleepy - understanding that sleepiness will vary on a day to day basis  We will  Call you in couple of weeks for download results from CPAP  follow up Dr. Vassie Loll  In 3 months

## 2011-03-19 NOTE — Assessment & Plan Note (Signed)
We discussed several stratigies to help with CPAP mask /compliance  Set up for download   Plan:  Use Saline nasal rinses and gel Twice daily   Use Nasal pad with mask over next week and if not helping we can look at another mask fitting with [facility].  Call if any more problems with mask.  Weight loss encouraged, compliance with goal of at least 4-6 hrs every night is the expectation.  Advised against medications with sedative side effects  Cautioned against driving when sleepy - understanding that sleepiness will vary on a day to day basis  We will  Call you in couple of weeks for download results from CPAP

## 2011-03-19 NOTE — Progress Notes (Signed)
Subjective:    Patient ID: Aaron Schmidt, male    DOB: 1933/01/25, 77 y.o.   MRN: [id number]  HPI  77/M, hypertensive, chronic back pain for fu of obstructive sleep apnea  dr Venetia Maxon witnessed apneas post -op , he still takes hydromorphone 2 mg q 6h as needed inlcuding at bedtime, Lyrica  & amitryptiline for insomnia - has taken this for many yrs , He has just started on methacarbamol , did not tolerate flexeril Epworth Sleepiness Score 6 Takes afternoon nap on occasion x 40 minsto rest his back Wife has noted snoring , apneas on few occasions  wt down 12 lbs Reviewed PSG (180 lbs ) >> severe obstructive sleep apnea with [facility] 38/h & desatns to 82% Started on auto 5-15 march '12 pressure Ok, no dryness Download reviewed >> residual events of 6/h , avg pr 8 cm, leak + with nasal pillows  01/02/2011 Nasal breakdown, nasal pillows caused nose bleeds Download on 8 cm 3/15- 12/28/10 >> no residual events, good usage Unfortunately he does not report improvement in energy levels  03/13/11 Follow up OV  Pt presents for OSA follow up .He continues to wear CPAP each night on average 4-6 hrs . However he still  Does not like the mask. Has not noticed a great change in his fatigue level and does not feel energetic in am.  We discussed the consequences of OSA on the body . Also he is on several pain meds that can contribute to  His energy and fatigue level. He continues to have difficulty with his mask, he has a nasal pad to help with nasal irritation but  Says the bridge of his nose still remains sore , also has imprint of mask on face for couple hours after mask is removed.      Review of Systems    Constitutional:   No  weight loss, night sweats,  Fevers, chills,  +fatigue, or  lassitude.  HEENT:   No headaches,  Difficulty swallowing,  Tooth/dental problems, or  Sore throat,                No sneezing, itching, ear ache, nasal congestion, post nasal drip,   CV:  No chest pain,  Orthopnea,  PND, swelling in lower extremities, anasarca, dizziness, palpitations, syncope.   GI  No heartburn, indigestion, abdominal pain, nausea, vomiting, diarrhea, change in bowel habits, loss of appetite, bloody stools.   Resp: No shortness of breath with exertion or at rest.  No excess mucus, no productive cough,  No non-productive cough,  No coughing up of blood.  No change in color of mucus.  No wheezing.  No chest wall deformity  Skin: no rash or lesions.  GU: no dysuria, change in color of urine, no urgency or frequency.  No flank pain, no hematuria   MS:  No joint   swelling.  No decreased range of motion.    Psych:  No change in mood or affect. No depression or anxiety.  No memory loss.       Objective:   Physical Exam Gen. Pleasant, well-nourished, in no distress ENT - no lesions, no post nasal drip,  Neck: No JVD, no thyromegaly, no carotid bruits Lungs: no use of accessory muscles, no dullness to percussion, clear without rales or rhonchi  Cardiovascular: Rhythm regular, heart sounds  normal, no murmurs or gallops, no peripheral edema Musculoskeletal: No deformities, no cyanosis or clubbing          Assessment &  Plan:

## 2012-03-24 ENCOUNTER — Telehealth: Payer: Self-pay | Admitting: Pulmonary Disease

## 2012-03-24 DIAGNOSIS — G4733 Obstructive sleep apnea (adult) (pediatric): Secondary | ICD-10-CM

## 2012-03-24 NOTE — Telephone Encounter (Signed)
LMTCB

## 2012-03-24 NOTE — Telephone Encounter (Signed)
Pl send order to dme to check his cpap

## 2012-03-24 NOTE — Telephone Encounter (Signed)
Spoke with pt. He states that he is already using the humidifier without heat. He has been checking his BP and it is normal to low, "has never been high". Pt seems confused about his machine to me. I told him to call DME if had questions about the CPAP and supplies and he states that they will not do anything without order from [place]. RA, please advise thanks

## 2012-03-24 NOTE — Telephone Encounter (Signed)
Spoke with pt. He states that he ordered a new hose for CPAP that was "heated" and since using this he has been waking up every am with terrible HA. He states that the DME co told him "how to adjust the heated hose" and this still did not work. He then decided to use the old hose but continues to have HA's. Sometimes the HA wakes him up out of his sleep. Please advise recs thanks

## 2012-03-24 NOTE — Telephone Encounter (Signed)
Use humidifier without heat if possible Check Bp Tylenol ok prn Trial of nasal spray(nasonex or flonase) each nare at bedtime  if this persists

## 2012-03-25 NOTE — Telephone Encounter (Signed)
Order placed for DME to check function of cpap. Pt is aware. Carron Curie, CMA

## 2012-09-11 ENCOUNTER — Other Ambulatory Visit (HOSPITAL_BASED_OUTPATIENT_CLINIC_OR_DEPARTMENT_OTHER): Payer: Self-pay | Admitting: Neurosurgery

## 2012-09-11 ENCOUNTER — Other Ambulatory Visit (HOSPITAL_BASED_OUTPATIENT_CLINIC_OR_DEPARTMENT_OTHER): Payer: Self-pay | Admitting: Anesthesiology

## 2012-09-11 DIAGNOSIS — M5410 Radiculopathy, site unspecified: Secondary | ICD-10-CM

## 2012-09-11 DIAGNOSIS — M549 Dorsalgia, unspecified: Secondary | ICD-10-CM

## 2012-09-27 ENCOUNTER — Ambulatory Visit (HOSPITAL_BASED_OUTPATIENT_CLINIC_OR_DEPARTMENT_OTHER)
Admission: RE | Admit: 2012-09-27 | Discharge: 2012-09-27 | Disposition: A | Discharge status: Home / Self Care | Payer: Medicare Other | Source: Ambulatory Visit | Attending: Anesthesiology | Admitting: Anesthesiology

## 2012-09-27 DIAGNOSIS — M549 Dorsalgia, unspecified: Secondary | ICD-10-CM

## 2012-09-27 DIAGNOSIS — M47817 Spondylosis without myelopathy or radiculopathy, lumbosacral region: Secondary | ICD-10-CM | POA: Insufficient documentation

## 2012-09-27 DIAGNOSIS — M51379 Other intervertebral disc degeneration, lumbosacral region without mention of lumbar back pain or lower extremity pain: Secondary | ICD-10-CM | POA: Insufficient documentation

## 2012-09-27 DIAGNOSIS — M5410 Radiculopathy, site unspecified: Secondary | ICD-10-CM

## 2012-09-27 DIAGNOSIS — M5137 Other intervertebral disc degeneration, lumbosacral region: Secondary | ICD-10-CM | POA: Insufficient documentation

## 2012-09-27 MED ORDER — GADOBENATE DIMEGLUMINE 529 MG/ML IV SOLN
20.0000 mL | Freq: Once | INTRAVENOUS | Status: AC | PRN
  Administered 2012-09-27: 20 mL via INTRAVENOUS

## 2013-07-15 DIAGNOSIS — M961 Postlaminectomy syndrome, not elsewhere classified: Secondary | ICD-10-CM

## 2013-07-15 HISTORY — DX: Postlaminectomy syndrome, not elsewhere classified: M96.1

## 2013-07-17 ENCOUNTER — Other Ambulatory Visit (HOSPITAL_BASED_OUTPATIENT_CLINIC_OR_DEPARTMENT_OTHER): Payer: Self-pay | Admitting: Neurosurgery

## 2013-07-17 DIAGNOSIS — IMO0002 Reserved for concepts with insufficient information to code with codable children: Secondary | ICD-10-CM

## 2013-07-18 ENCOUNTER — Ambulatory Visit (HOSPITAL_BASED_OUTPATIENT_CLINIC_OR_DEPARTMENT_OTHER): Payer: Medicare Other

## 2013-07-21 ENCOUNTER — Ambulatory Visit (HOSPITAL_BASED_OUTPATIENT_CLINIC_OR_DEPARTMENT_OTHER)
Admission: RE | Admit: 2013-07-21 | Discharge: 2013-07-21 | Disposition: A | Discharge status: Home / Self Care | Payer: Medicare Other | Source: Ambulatory Visit | Attending: Neurosurgery | Admitting: Neurosurgery

## 2013-07-21 DIAGNOSIS — Z981 Arthrodesis status: Secondary | ICD-10-CM | POA: Insufficient documentation

## 2013-07-21 DIAGNOSIS — M5126 Other intervertebral disc displacement, lumbar region: Secondary | ICD-10-CM | POA: Insufficient documentation

## 2013-07-21 DIAGNOSIS — M79609 Pain in unspecified limb: Secondary | ICD-10-CM | POA: Insufficient documentation

## 2013-07-21 DIAGNOSIS — R209 Unspecified disturbances of skin sensation: Secondary | ICD-10-CM | POA: Insufficient documentation

## 2013-07-21 DIAGNOSIS — M545 Low back pain, unspecified: Secondary | ICD-10-CM | POA: Insufficient documentation

## 2013-07-21 DIAGNOSIS — IMO0002 Reserved for concepts with insufficient information to code with codable children: Secondary | ICD-10-CM

## 2013-07-21 MED ORDER — GADOBENATE DIMEGLUMINE 529 MG/ML IV SOLN
18.0000 mL | Freq: Once | INTRAVENOUS | Status: AC | PRN
  Administered 2013-07-21: 18 mL via INTRAVENOUS

## 2013-07-29 DIAGNOSIS — G8929 Other chronic pain: Secondary | ICD-10-CM

## 2013-07-29 DIAGNOSIS — M545 Low back pain, unspecified: Secondary | ICD-10-CM

## 2013-07-29 DIAGNOSIS — M5416 Radiculopathy, lumbar region: Secondary | ICD-10-CM

## 2013-07-29 HISTORY — DX: Other chronic pain: G89.29

## 2013-07-29 HISTORY — DX: Radiculopathy, lumbar region: M54.16

## 2013-07-29 HISTORY — DX: Low back pain, unspecified: M54.50

## 2013-08-10 DIAGNOSIS — M48061 Spinal stenosis, lumbar region without neurogenic claudication: Secondary | ICD-10-CM

## 2013-08-10 HISTORY — DX: Spinal stenosis, lumbar region without neurogenic claudication: M48.061

## 2013-12-01 DIAGNOSIS — M25519 Pain in unspecified shoulder: Secondary | ICD-10-CM

## 2013-12-01 HISTORY — DX: Pain in unspecified shoulder: M25.519

## 2014-03-03 DIAGNOSIS — M542 Cervicalgia: Secondary | ICD-10-CM

## 2014-03-03 HISTORY — DX: Cervicalgia: M54.2

## 2014-05-12 DIAGNOSIS — M47812 Spondylosis without myelopathy or radiculopathy, cervical region: Secondary | ICD-10-CM

## 2014-05-12 HISTORY — DX: Spondylosis without myelopathy or radiculopathy, cervical region: M47.812

## 2014-05-19 ENCOUNTER — Ambulatory Visit (HOSPITAL_BASED_OUTPATIENT_CLINIC_OR_DEPARTMENT_OTHER)
Admission: RE | Admit: 2014-05-19 | Discharge: 2014-05-19 | Disposition: A | Discharge status: Home / Self Care | Payer: Medicare Other | Source: Ambulatory Visit | Attending: Neurosurgery | Admitting: Neurosurgery

## 2014-05-19 ENCOUNTER — Other Ambulatory Visit (HOSPITAL_BASED_OUTPATIENT_CLINIC_OR_DEPARTMENT_OTHER): Payer: Self-pay | Admitting: Neurosurgery

## 2014-05-19 DIAGNOSIS — M47812 Spondylosis without myelopathy or radiculopathy, cervical region: Secondary | ICD-10-CM

## 2014-05-19 DIAGNOSIS — M5030 Other cervical disc degeneration, unspecified cervical region: Secondary | ICD-10-CM | POA: Insufficient documentation

## 2014-05-26 ENCOUNTER — Other Ambulatory Visit: Payer: Self-pay | Admitting: Neurosurgery

## 2014-05-26 DIAGNOSIS — M4802 Spinal stenosis, cervical region: Secondary | ICD-10-CM

## 2014-05-26 HISTORY — DX: Spinal stenosis, cervical region: M48.02

## 2014-06-16 ENCOUNTER — Encounter (HOSPITAL_COMMUNITY): Payer: Self-pay | Admitting: Pharmacy Technician

## 2014-06-17 ENCOUNTER — Ambulatory Visit (HOSPITAL_COMMUNITY)
Admission: RE | Admit: 2014-06-17 | Discharge: 2014-06-17 | Disposition: A | Discharge status: Home / Self Care | Payer: Medicare Other | Source: Ambulatory Visit | Attending: Neurosurgery | Admitting: Neurosurgery

## 2014-06-17 ENCOUNTER — Encounter (HOSPITAL_COMMUNITY): Payer: Self-pay

## 2014-06-17 ENCOUNTER — Encounter (HOSPITAL_COMMUNITY)
Admission: RE | Admit: 2014-06-17 | Discharge: 2014-06-17 | Disposition: A | Discharge status: Home / Self Care | Payer: Medicare Other | Source: Ambulatory Visit | Attending: Neurosurgery | Admitting: Neurosurgery

## 2014-06-17 DIAGNOSIS — I10 Essential (primary) hypertension: Secondary | ICD-10-CM | POA: Insufficient documentation

## 2014-06-17 DIAGNOSIS — G4733 Obstructive sleep apnea (adult) (pediatric): Secondary | ICD-10-CM | POA: Insufficient documentation

## 2014-06-17 DIAGNOSIS — Z01818 Encounter for other preprocedural examination: Secondary | ICD-10-CM | POA: Insufficient documentation

## 2014-06-17 HISTORY — DX: Sleep apnea, unspecified: G47.30

## 2014-06-17 HISTORY — DX: Unspecified osteoarthritis, unspecified site: M19.90

## 2014-06-17 LAB — CBC
HCT: 40.5 % (ref 39.0–52.0)
Hemoglobin: 13.2 g/dL (ref 13.0–17.0)
MCH: 30.3 pg (ref 26.0–34.0)
MCHC: 32.6 g/dL (ref 30.0–36.0)
MCV: 92.9 fL (ref 78.0–100.0)
Platelets: 153 10*3/uL (ref 150–400)
RBC: 4.36 MIL/uL (ref 4.22–5.81)
RDW: 13.9 % (ref 11.5–15.5)
WBC: 6.5 10*3/uL (ref 4.0–10.5)

## 2014-06-17 LAB — BASIC METABOLIC PANEL
Anion gap: 13 (ref 5–15)
BUN: 20 mg/dL (ref 6–23)
CO2: 29 mEq/L (ref 19–32)
Calcium: 9.3 mg/dL (ref 8.4–10.5)
Chloride: 103 mEq/L (ref 96–112)
Creatinine, Ser: 1.08 mg/dL (ref 0.50–1.35)
GFR calc Af Amer: 73 mL/min — ABNORMAL LOW (ref >90)
GFR calc non Af Amer: 63 mL/min — ABNORMAL LOW (ref >90)
Glucose, Bld: 91 mg/dL (ref 70–99)
Potassium: 4 mEq/L (ref 3.7–5.3)
Sodium: 145 mEq/L (ref 137–147)

## 2014-06-17 LAB — SURGICAL PCR SCREEN
MRSA, PCR: NEGATIVE
Staphylococcus aureus: NEGATIVE

## 2014-06-17 NOTE — Pre-Procedure Instructions (Signed)
Aaron Schmidt  06/17/2014   Your procedure is scheduled on:  06/24/2014  Report to [facility] Ad[facility]  [facility] A at 6:30 AM.  Call this number if you have problems the morning of surgery: [phone]   Remember:   Do not eat food or drink liquids after midnight.  On Wednesday    Take these medicines the morning of surgery with A SIP OF WATER: Pain medicine is OK if needed, Lyrica, Tizanidine    Do not wear jewelry  Do not wear lotions, powders, or perfumes. You may wear deodorant.   Men may shave face and neck.  Do not bring valuables to the hospital.  [facility] is not responsible  for any belongings or valuables.               Contacts, dentures or bridgework may not be worn into surgery.  Leave suitcase in the car. After surgery it may be brought to your room.  For patients admitted to the hospital, discharge time is determined by your                treatment team.               Patients discharged the day of surgery will not be allowed to drive  home.  Name and phone number of your driver: /w wife   Special Instructions: Special Instructions: [place] - Preparing for Surgery  Before surgery, you can play an important role.  Because skin is not sterile, your skin needs to be as free of germs as possible.  You can reduce the number of germs on you skin by washing with CHG (chlorahexidine gluconate) soap before surgery.  CHG is an antiseptic cleaner which kills germs and bonds with the skin to continue killing germs even after washing.  Please DO NOT use if you have an allergy to CHG or antibacterial soaps.  If your skin becomes reddened/irritated stop using the CHG and inform your nurse when you arrive at Short Stay.  Do not shave (including legs and underarms) for at least 48 hours prior to the first CHG shower.  You may shave your face.  Please follow these instructions carefully:   1.  Shower with CHG Soap the night before surgery and the  morning of  Surgery.  2.  If you choose to wash your hair, wash your hair first as usual with your  normal shampoo.  3.  After you shampoo, rinse your hair and body thoroughly to remove the  Shampoo.  4.  Use CHG as you would any other liquid soap.  You can apply chg directly to the skin and wash gently with scrungie or a clean washcloth.  5.  Apply the CHG Soap to your body ONLY FROM THE NECK DOWN.    Do not use on open wounds or open sores.  Avoid contact with your eyes, ears, mouth and genitals (private parts).  Wash genitals (private parts)   with your normal soap.  6.  Wash thoroughly, paying special attention to the area where your surgery will be performed.  7.  Thoroughly rinse your body with warm water from the neck down.  8.  DO NOT shower/wash with your normal soap after using and rinsing off   the CHG Soap.  9.  Pat yourself dry with a clean towel.            10.  Wear clean pajamas.  11.  Place clean sheets on your bed the night of your first shower and do not sleep with pets.  Day of Surgery  Do not apply any lotions/deodorants the morning of surgery.  Please wear clean clothes to the hospital/surgery center.   Please read over the following fact sheets that you were given: Pain Booklet, Coughing and Deep Breathing, MRSA Information and Surgical Site Infection Prevention

## 2014-06-17 NOTE — Progress Notes (Signed)
Pt. Aware of need to hold aspirin starting today.

## 2014-06-17 NOTE — Progress Notes (Signed)
Call made to [facility] primary care- Dr. Hulan Fess, requested EKG, they report he hasn't had one in the past that they have on record in his chart.

## 2014-06-18 NOTE — Progress Notes (Addendum)
Anesthesia Chart Review: Patient is a 80 year old male scheduled for cervical 3-7 posterior fusion with lateral mass fixation on 06/24/14 by Dr. Vertell Limber.  History includes former smoker, HTN, severe OSA (by 09/2010 sleep study; referred after witnessed apneas post-operative) no longer using CPAP, arthritis, bilateral shoulder surgeries left rotator cuff repair 09/2010, multiple back surgeries including lumbar fusion 09/2009 and 06/2010. PCP is Dr. Hulan Fess.    Preoperative CXR and labs noted.   EKG on 06/17/14 showed: SR with first degree AVB, LAD, left BBB. His QRS has widened since his last tracing on 10/20/10 and current EKG shows more of a typical left BBB pattern.  The interpreting cardiologist did not however, feel his EKG was significantly changed.  According to PAT RN notations, no history of echo, stress, or cath. I reviewed above with anesthesiologist Dr. Linna Caprice.  Patient has not had previous work-up for left BBB, so preoperative cardiology evaluation is recommended.  I have notified Jessica at Dr. Melven Sartorius office.  George Hugh [facility] Short Stay Center/Anesthesiology Phone [phone] 06/18/2014 4:41 PM  Addendum: Patient was seen by cardiologist Dr. Ena Dawley earlier today for a preoperative evaluation.  Her note states, "The patient has 1.AVB and LBBB on his ECG that is not changed since 2012. The patient has no symptoms of angina or heart failure. He can certainly achieve at least 4 METS. Considering his other risk factors including hypertension, sleep apnea, obesity he is considered an intermediate risk for moderate risk surgery. However, there is currently no contraindication from cardiac standpoint for this gentleman to undergo scheduled surgery."  George Hugh [facility] Short Stay Center/Anesthesiology Phone [phone] 06/21/2014 5:25 PM

## 2014-06-21 ENCOUNTER — Encounter: Payer: Self-pay | Admitting: Cardiology

## 2014-06-21 ENCOUNTER — Ambulatory Visit (INDEPENDENT_AMBULATORY_CARE_PROVIDER_SITE_OTHER): Payer: Medicare Other | Admitting: Cardiology

## 2014-06-21 VITALS — BP 140/68 | HR 83 | Ht 70.5 in | Wt 205.0 lb

## 2014-06-21 DIAGNOSIS — I447 Left bundle-branch block, unspecified: Secondary | ICD-10-CM | POA: Insufficient documentation

## 2014-06-21 DIAGNOSIS — Z01810 Encounter for preprocedural cardiovascular examination: Secondary | ICD-10-CM

## 2014-06-21 DIAGNOSIS — E785 Hyperlipidemia, unspecified: Secondary | ICD-10-CM

## 2014-06-21 HISTORY — DX: Hyperlipidemia, unspecified: E78.5

## 2014-06-21 HISTORY — DX: Encounter for preprocedural cardiovascular examination: Z01.810

## 2014-06-21 HISTORY — DX: Left bundle-branch block, unspecified: I44.7

## 2014-06-21 NOTE — Progress Notes (Signed)
Patient ID: Aaron Schmidt, male   DOB: 1933-04-11, 80 y.o.   MRN: [id number]     Patient Name: Aaron Schmidt Date of Encounter: 06/21/2014  Primary Care Provider:  Gennette Pac, MD Primary Cardiologist:  Dorothy Spark  Problem List   Past Medical History  Diagnosis Date  . Hypertension   . Sleep apnea 2012    used CPAP 2 yrs. ago, feels he sleeps better w/o, no longer using   . Arthritis     Degeneration spine & stenosis   Past Surgical History  Procedure Laterality Date  . Shoulder surgery Bilateral   . Back surgery      x4 back surgery x2 fusion -  . Arm neuroplasty      at 14 yrs. of age- fell off tractor- had fracture & repair *& later- 23's had  transplantation of a nerve at the elbow  . Carpal tunnel release Right     Allergies  Allergies  Allergen Reactions  . Diazepam Other (See Comments)    REACTION: "makes goofy"    HPI  A 80 year old male with h.o hypertension, hyperlipidemia, obesity, OSA on CPAP who is scheduled for C3-7 fusion on June 24, 2014. He has a distant h/o smoking. No prior cardiac history. He is referred to [place] for preoperative evaluation for concern of abnormal ECG. The patient underwent prior orthopedic surgeries without any complications.  He is not very active as he is limited by back pain, however he is able to perform activities of daily without any symptoms of chest pain, resting shortness of breath, dyspnea on exertion. He also denies palpitations, syncope, orthopnea or PND.  He has no family h/o premature CAD or SCD.   Home Medications  Prior to Admission medications   Medication Sig Start Date End Date Taking? Authorizing Provider  b complex vitamins tablet Take 1 tablet by mouth daily.     Yes Historical Provider, MD  Cholecalciferol (VITAMIN D) 2000 UNITS CAPS Take 1 capsule by mouth daily.     Yes Historical Provider, MD  clobetasol (TEMOVATE) 0.05 % external solution Apply 1 application topically 2 (two) times  daily.   Yes Historical Provider, MD  Docusate Calcium (STOOL SOFTENER PO) Take 3-4 tablets by mouth 2 (two) times daily. Takes 3 tablets every morning and 4 tablets every evening.   Yes Historical Provider, MD  doxazosin (CARDURA) 4 MG tablet Take 4 mg by mouth at bedtime.     Yes Historical Provider, MD  Flaxseed, Linseed, (FLAX SEED OIL) 1000 MG CAPS Take 1 capsule by mouth daily.     Yes Historical Provider, MD  HYDROmorphone (DILAUDID) 2 MG tablet Take 2 mg by mouth daily as needed for severe pain.    Yes Historical Provider, MD  L-Lysine 500 MG CAPS Take 1 capsule by mouth daily.     Yes Historical Provider, MD  Multiple Vitamin (MULTIVITAMIN) capsule Take 1 capsule by mouth daily.     Yes Historical Provider, MD  nortriptyline (PAMELOR) 25 MG capsule Take 50 mg by mouth at bedtime.    Yes Historical Provider, MD  pregabalin (LYRICA) 150 MG capsule Take 150 mg by mouth 2 (two) times daily.   Yes Historical Provider, MD  pyridOXINE (VITAMIN B-6) 100 MG tablet Take 100 mg by mouth daily.     Yes Historical Provider, MD  saw palmetto 80 MG capsule Take 240 mg by mouth daily.    Yes Historical Provider, MD  Senna-Psyllium (PERDIEM PO) Take  3 tablets by mouth 2 (two) times daily.    Yes Historical Provider, MD  simvastatin (ZOCOR) 40 MG tablet Take 40 mg by mouth at bedtime.     Yes Historical Provider, MD  tiZANidine (ZANAFLEX) 2 MG tablet Take 2 mg by mouth 3 (three) times daily as needed for muscle spasms.   Yes Historical Provider, MD  triamterene-hydrochlorothiazide (MAXZIDE-25) 37.5-25 MG per tablet Take 1 tablet by mouth daily.   Yes Historical Provider, MD  aspirin EC 81 MG tablet Take 81 mg by mouth daily.    Historical Provider, MD    Family History  No family history on file.  Social History  History   Social History  . Marital Status: Married    Spouse Name: N/A    Number of Children: N/A  . Years of Education: N/A   Occupational History  . 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 Smoking status: Former Smoker -- 24 years    Types: Pipe    Quit date: 09/24/1978  . Smokeless tobacco: Never Used  . Alcohol Use: Yes     Comment: 3 drinks/day - gin or vodka   . Drug Use: No  . Sexual Activity: Not on file   Other Topics Concern  . Not on file   Social History Narrative  . No narrative on file     Review of Systems, as per HPI, otherwise negative General:  No chills, fever, night sweats or weight changes.  Cardiovascular:  No chest pain, dyspnea on exertion, edema, orthopnea, palpitations, paroxysmal nocturnal dyspnea. Dermatological: No rash, lesions/masses Respiratory: No cough, dyspnea Urologic: No hematuria, dysuria Abdominal:   No nausea, vomiting, diarrhea, bright red blood per rectum, melena, or hematemesis Neurologic:  No visual changes, wkns, changes in mental status. All other systems reviewed and are otherwise negative except as noted above.  Physical Exam  Blood pressure 140/68, pulse 83, height 5' 10.5" (1.791 m), weight 205 lb (92.987 kg), SpO2 97.00%.  General: Pleasant, NAD Psych: Normal affect. Neuro: Alert and oriented X 3. Moves all extremities spontaneously. HEENT: Normal  Neck: Supple without bruits or JVD. Lungs:  Resp regular and unlabored, CTA. Heart: RRR no s3, s4, or murmurs. Abdomen: Soft, non-tender, non-distended, BS + x 4.  Extremities: No clubbing, cyanosis or edema. DP/PT/Radials 2+ and equal bilaterally.  Labs:  No results found for this basename: CKTOTAL, CKMB, TROPONINI,  in the last 72 hours Lab Results  Component Value Date   WBC 6.5 06/17/2014   HGB 13.2 06/17/2014   HCT 40.5 06/17/2014   MCV 92.9 06/17/2014   PLT 153 06/17/2014    No results found for this basename: DDIMER   No components found with this basename: POCBNP,     Component Value Date/Time   NA 145 06/17/2014 1124   K 4.0 06/17/2014 1124   CL 103 06/17/2014 1124   CO2 29 06/17/2014 1124    GLUCOSE 91 06/17/2014 1124   BUN 20 06/17/2014 1124   CREATININE 1.08 06/17/2014 1124   CALCIUM 9.3 06/17/2014 1124   PROT 6.1 10/20/2010 1625   ALBUMIN 3.7 10/20/2010 1625   AST 17 10/20/2010 1625   ALT 16 10/20/2010 1625   ALKPHOS 71 10/20/2010 1625   BILITOT 0.4 10/20/2010 1625   GFRNONAA 63* 06/17/2014 1124   GFRAA 73* 06/17/2014 1124   No results found for this basename: CHOL, HDL, LDLCALC, TRIG    Accessory Clinical  Findings  Echocardiogram - none  ECG - SR, 1.AVB, LAD, LBBB    Assessment & Plan  80 year old male scheduled for cervical spine fusion on 06/24/2014.  The patient has 1.AVB and LBBB on his ECG that is not changed since 2012. The patient has no symptoms of angina or heart failure. He can certainly achieve at least 4 METS. Considering his other risk factors including hypertension, sleep apnea, obesity he is considered an intermediate risk for moderate risk surgery. However, there is currently no contraindication from cardiac standpoint for this gentleman to undergo scheduled surgery.  Re measured BP was within normal limit. We would recommend to continue the same medical management.  Follow up in 1 year.    Dorothy Spark, MD, [facility] 06/21/2014, 2:20 PM

## 2014-06-21 NOTE — Patient Instructions (Signed)
Your physician recommends that you continue on your current medications as directed. Please refer to the Current Medication list given to you today.  Your physician wants you to follow-up in: 6 months. You will receive a reminder letter in the mail two months in advance. If you don't receive a letter, please call our office to schedule the follow-up appointment.  

## 2014-06-23 MED ORDER — CEFAZOLIN SODIUM-DEXTROSE 2-3 GM-% IV SOLR
2.0000 g | INTRAVENOUS | Status: AC
  Administered 2014-06-24: 2 g via INTRAVENOUS
  Filled 2014-06-23: qty 50

## 2014-06-24 ENCOUNTER — Inpatient Hospital Stay (HOSPITAL_COMMUNITY)
Admission: RE | Admit: 2014-06-24 | Discharge: 2014-06-25 | DRG: 473 | Disposition: A | Discharge status: Home / Self Care | Payer: Medicare Other | Source: Ambulatory Visit | Attending: Neurosurgery | Admitting: Neurosurgery

## 2014-06-24 ENCOUNTER — Inpatient Hospital Stay (HOSPITAL_COMMUNITY): Payer: Medicare Other

## 2014-06-24 ENCOUNTER — Encounter (HOSPITAL_COMMUNITY): Payer: Self-pay | Admitting: *Deleted

## 2014-06-24 ENCOUNTER — Encounter (HOSPITAL_COMMUNITY): Payer: Medicare Other | Admitting: Vascular Surgery

## 2014-06-24 ENCOUNTER — Inpatient Hospital Stay (HOSPITAL_COMMUNITY): Payer: Medicare Other | Admitting: Anesthesiology

## 2014-06-24 ENCOUNTER — Encounter (HOSPITAL_COMMUNITY)
Admission: RE | Disposition: A | Discharge status: Home / Self Care | Payer: Self-pay | Source: Ambulatory Visit | Attending: Neurosurgery

## 2014-06-24 DIAGNOSIS — M5412 Radiculopathy, cervical region: Secondary | ICD-10-CM

## 2014-06-24 DIAGNOSIS — M4712 Other spondylosis with myelopathy, cervical region: Principal | ICD-10-CM | POA: Diagnosis present

## 2014-06-24 DIAGNOSIS — M4302 Spondylolysis, cervical region: Secondary | ICD-10-CM | POA: Diagnosis present

## 2014-06-24 DIAGNOSIS — M4692 Unspecified inflammatory spondylopathy, cervical region: Secondary | ICD-10-CM | POA: Diagnosis present

## 2014-06-24 DIAGNOSIS — G959 Disease of spinal cord, unspecified: Secondary | ICD-10-CM

## 2014-06-24 HISTORY — DX: Disease of spinal cord, unspecified: G95.9

## 2014-06-24 HISTORY — DX: Radiculopathy, cervical region: M54.12

## 2014-06-24 HISTORY — PX: POSTERIOR CERVICAL FUSION/FORAMINOTOMY: SHX5038

## 2014-06-24 SURGERY — POSTERIOR CERVICAL FUSION/FORAMINOTOMY LEVEL 4
Anesthesia: General | Site: Neck

## 2014-06-24 MED ORDER — ONDANSETRON HCL 4 MG/2ML IJ SOLN
INTRAMUSCULAR | Status: DC | PRN
  Administered 2014-06-24: 4 mg via INTRAVENOUS

## 2014-06-24 MED ORDER — METHOCARBAMOL 1000 MG/10ML IJ SOLN
500.0000 mg | Freq: Four times a day (QID) | INTRAVENOUS | Status: DC | PRN
  Filled 2014-06-24: qty 5

## 2014-06-24 MED ORDER — PHENOL 1.4 % MT LIQD: 1.0000 | OROMUCOSAL | Status: DC | PRN

## 2014-06-24 MED ORDER — OXYCODONE HCL 5 MG PO TABS
ORAL_TABLET | ORAL | Status: AC
  Filled 2014-06-24: qty 1

## 2014-06-24 MED ORDER — STERILE WATER FOR INJECTION IJ SOLN
INTRAMUSCULAR | Status: AC
  Filled 2014-06-24: qty 10

## 2014-06-24 MED ORDER — THROMBIN 5000 UNITS EX SOLR
CUTANEOUS | Status: DC | PRN
  Administered 2014-06-24 (×2): 5000 [IU] via TOPICAL

## 2014-06-24 MED ORDER — PREGABALIN 50 MG PO CAPS
150.0000 mg | ORAL_CAPSULE | Freq: Two times a day (BID) | ORAL | Status: DC
  Administered 2014-06-24 – 2014-06-25 (×2): 150 mg via ORAL
  Filled 2014-06-24 (×6): qty 1

## 2014-06-24 MED ORDER — SODIUM CHLORIDE 0.9 % IJ SOLN
INTRAMUSCULAR | Status: AC
  Filled 2014-06-24: qty 10

## 2014-06-24 MED ORDER — GLYCOPYRROLATE 0.2 MG/ML IJ SOLN
INTRAMUSCULAR | Status: DC | PRN
  Administered 2014-06-24: 0.6 mg via INTRAVENOUS

## 2014-06-24 MED ORDER — BISACODYL 10 MG RE SUPP: 10.0000 mg | Freq: Every day | RECTAL | Status: DC | PRN

## 2014-06-24 MED ORDER — FENTANYL CITRATE 0.05 MG/ML IJ SOLN
INTRAMUSCULAR | Status: AC
  Filled 2014-06-24: qty 5

## 2014-06-24 MED ORDER — CLOBETASOL PROPIONATE 0.05 % EX CREA
TOPICAL_CREAM | Freq: Two times a day (BID) | CUTANEOUS | Status: DC
  Filled 2014-06-24: qty 15

## 2014-06-24 MED ORDER — METHOCARBAMOL 500 MG PO TABS
ORAL_TABLET | ORAL | Status: AC
  Filled 2014-06-24: qty 1

## 2014-06-24 MED ORDER — METHOCARBAMOL 500 MG PO TABS
500.0000 mg | ORAL_TABLET | Freq: Four times a day (QID) | ORAL | Status: DC | PRN
  Administered 2014-06-24 – 2014-06-25 (×3): 500 mg via ORAL
  Filled 2014-06-24 (×2): qty 1

## 2014-06-24 MED ORDER — PANTOPRAZOLE SODIUM 40 MG IV SOLR
40.0000 mg | Freq: Every day | INTRAVENOUS | Status: DC
  Filled 2014-06-24: qty 40

## 2014-06-24 MED ORDER — KETAMINE HCL 100 MG/ML IJ SOLN
INTRAMUSCULAR | Status: AC
  Filled 2014-06-24: qty 1

## 2014-06-24 MED ORDER — PHENYLEPHRINE HCL 10 MG/ML IJ SOLN
10.0000 mg | INTRAVENOUS | Status: DC | PRN
  Administered 2014-06-24: 50 ug/min via INTRAVENOUS

## 2014-06-24 MED ORDER — ONDANSETRON HCL 4 MG/2ML IJ SOLN
INTRAMUSCULAR | Status: AC
  Filled 2014-06-24: qty 2

## 2014-06-24 MED ORDER — KCL IN DEXTROSE-NACL 20-5-0.45 MEQ/L-%-% IV SOLN
INTRAVENOUS | Status: DC
  Filled 2014-06-24 (×4): qty 1000

## 2014-06-24 MED ORDER — 0.9 % SODIUM CHLORIDE (POUR BTL) OPTIME
TOPICAL | Status: DC | PRN
  Administered 2014-06-24: 1000 mL

## 2014-06-24 MED ORDER — L-LYSINE 500 MG PO CAPS
1.0000 | ORAL_CAPSULE | Freq: Every day | ORAL | Status: DC
Start: 2014-06-24 — End: 2014-06-24

## 2014-06-24 MED ORDER — NORTRIPTYLINE HCL 25 MG PO CAPS
50.0000 mg | ORAL_CAPSULE | Freq: Every day | ORAL | Status: DC
  Administered 2014-06-24: 50 mg via ORAL
  Filled 2014-06-24 (×2): qty 2

## 2014-06-24 MED ORDER — ACETAMINOPHEN 325 MG PO TABS: 325.0000 mg | ORAL_TABLET | ORAL | Status: DC | PRN

## 2014-06-24 MED ORDER — KETAMINE HCL 10 MG/ML IJ SOLN
INTRAMUSCULAR | Status: DC | PRN
  Administered 2014-06-24: 50 mg via INTRAVENOUS

## 2014-06-24 MED ORDER — ACETAMINOPHEN 10 MG/ML IV SOLN
INTRAVENOUS | Status: AC
  Administered 2014-06-24: 1000 mg via INTRAVENOUS
  Filled 2014-06-24: qty 100

## 2014-06-24 MED ORDER — ALUM & MAG HYDROXIDE-SIMETH 200-200-20 MG/5ML PO SUSP: 30.0000 mL | Freq: Four times a day (QID) | ORAL | Status: DC | PRN

## 2014-06-24 MED ORDER — DEXAMETHASONE SODIUM PHOSPHATE 4 MG/ML IJ SOLN
INTRAMUSCULAR | Status: AC
  Filled 2014-06-24: qty 3

## 2014-06-24 MED ORDER — ROCURONIUM BROMIDE 50 MG/5ML IV SOLN
INTRAVENOUS | Status: AC
  Filled 2014-06-24: qty 1

## 2014-06-24 MED ORDER — SODIUM CHLORIDE 0.9 % IJ SOLN
3.0000 mL | Freq: Two times a day (BID) | INTRAMUSCULAR | Status: DC
Start: 2014-06-24 — End: 2014-06-25
  Administered 2014-06-24: 3 mL via INTRAVENOUS

## 2014-06-24 MED ORDER — PROPOFOL 10 MG/ML IV BOLUS
INTRAVENOUS | Status: DC | PRN
  Administered 2014-06-24: 140 mg via INTRAVENOUS

## 2014-06-24 MED ORDER — LIDOCAINE HCL (CARDIAC) 20 MG/ML IV SOLN
INTRAVENOUS | Status: DC | PRN
  Administered 2014-06-24: 80 mg via INTRAVENOUS

## 2014-06-24 MED ORDER — SODIUM CHLORIDE 0.9 % IJ SOLN: 3.0000 mL | INTRAMUSCULAR | Status: DC | PRN

## 2014-06-24 MED ORDER — PANTOPRAZOLE SODIUM 40 MG PO TBEC
40.0000 mg | DELAYED_RELEASE_TABLET | Freq: Every day | ORAL | Status: DC
  Administered 2014-06-24: 40 mg via ORAL
  Filled 2014-06-24: qty 1

## 2014-06-24 MED ORDER — OXYCODONE HCL 5 MG/5ML PO SOLN: 5.0000 mg | Freq: Once | ORAL | Status: AC | PRN

## 2014-06-24 MED ORDER — OXYCODONE HCL 5 MG PO TABS
5.0000 mg | ORAL_TABLET | Freq: Once | ORAL | Status: AC | PRN
  Administered 2014-06-24: 5 mg via ORAL

## 2014-06-24 MED ORDER — TRIAMTERENE-HCTZ 37.5-25 MG PO TABS
1.0000 | ORAL_TABLET | Freq: Every day | ORAL | Status: DC
  Filled 2014-06-24 (×2): qty 1

## 2014-06-24 MED ORDER — DOCUSATE SODIUM 100 MG PO CAPS: 100.0000 mg | ORAL_CAPSULE | Freq: Two times a day (BID) | ORAL | Status: DC

## 2014-06-24 MED ORDER — B COMPLEX PO TABS
1.0000 | ORAL_TABLET | Freq: Every day | ORAL | Status: DC
Start: 2014-06-24 — End: 2014-06-24

## 2014-06-24 MED ORDER — DOCUSATE SODIUM 100 MG PO CAPS
100.0000 mg | ORAL_CAPSULE | Freq: Two times a day (BID) | ORAL | Status: DC
Start: 2014-06-24 — End: 2014-06-25
  Administered 2014-06-24 – 2014-06-25 (×2): 100 mg via ORAL
  Filled 2014-06-24 (×3): qty 1

## 2014-06-24 MED ORDER — EPHEDRINE SULFATE 50 MG/ML IJ SOLN
INTRAMUSCULAR | Status: DC | PRN
  Administered 2014-06-24: 5 mg via INTRAVENOUS
  Administered 2014-06-24: 10 mg via INTRAVENOUS

## 2014-06-24 MED ORDER — VITAMIN D 50 MCG (2000 UT) PO CAPS: 1.0000 | ORAL_CAPSULE | Freq: Every day | ORAL | Status: DC

## 2014-06-24 MED ORDER — CLOBETASOL PROPIONATE 0.05 % EX SOLN: 1.0000 "application " | Freq: Two times a day (BID) | CUTANEOUS | Status: DC

## 2014-06-24 MED ORDER — SODIUM CHLORIDE 0.9 % IV SOLN: 250.0000 mL | INTRAVENOUS | Status: DC

## 2014-06-24 MED ORDER — VITAMIN D3 25 MCG (1000 UNIT) PO TABS
2000.0000 [IU] | ORAL_TABLET | Freq: Every day | ORAL | Status: DC
  Administered 2014-06-24 – 2014-06-25 (×2): 2000 [IU] via ORAL
  Filled 2014-06-24 (×2): qty 2

## 2014-06-24 MED ORDER — OXYCODONE-ACETAMINOPHEN 5-325 MG PO TABS
1.0000 | ORAL_TABLET | ORAL | Status: DC | PRN
  Administered 2014-06-24 – 2014-06-25 (×5): 2 via ORAL
  Filled 2014-06-24 (×5): qty 2

## 2014-06-24 MED ORDER — PHENYLEPHRINE HCL 10 MG/ML IJ SOLN
INTRAMUSCULAR | Status: DC | PRN
  Administered 2014-06-24: 120 ug via INTRAVENOUS

## 2014-06-24 MED ORDER — BACITRACIN ZINC 500 UNIT/GM EX OINT
TOPICAL_OINTMENT | CUTANEOUS | Status: DC | PRN
Start: 2014-06-24 — End: 2014-06-24
  Administered 2014-06-24: 1 via TOPICAL

## 2014-06-24 MED ORDER — MULTIVITAMINS PO CAPS
1.0000 | ORAL_CAPSULE | Freq: Every day | ORAL | Status: DC
Start: 2014-06-24 — End: 2014-06-24

## 2014-06-24 MED ORDER — HYDROMORPHONE HCL 1 MG/ML IJ SOLN
INTRAMUSCULAR | Status: AC
  Filled 2014-06-24: qty 1

## 2014-06-24 MED ORDER — SIMVASTATIN 40 MG PO TABS
40.0000 mg | ORAL_TABLET | Freq: Every day | ORAL | Status: DC
  Administered 2014-06-24: 40 mg via ORAL
  Filled 2014-06-24 (×2): qty 1

## 2014-06-24 MED ORDER — ASPIRIN EC 81 MG PO TBEC
81.0000 mg | DELAYED_RELEASE_TABLET | Freq: Every day | ORAL | Status: DC
  Administered 2014-06-24 – 2014-06-25 (×2): 81 mg via ORAL
  Filled 2014-06-24 (×2): qty 1

## 2014-06-24 MED ORDER — ACETAMINOPHEN 325 MG PO TABS: 650.0000 mg | ORAL_TABLET | ORAL | Status: DC | PRN

## 2014-06-24 MED ORDER — FLAX SEED OIL 1000 MG PO CAPS: 1.0000 | ORAL_CAPSULE | Freq: Every day | ORAL | Status: DC

## 2014-06-24 MED ORDER — SENNOSIDES-DOCUSATE SODIUM 8.6-50 MG PO TABS
1.0000 | ORAL_TABLET | Freq: Every evening | ORAL | Status: DC | PRN
  Filled 2014-06-24: qty 1

## 2014-06-24 MED ORDER — ROCURONIUM BROMIDE 100 MG/10ML IV SOLN
INTRAVENOUS | Status: DC | PRN
  Administered 2014-06-24 (×2): 10 mg via INTRAVENOUS
  Administered 2014-06-24: 40 mg via INTRAVENOUS

## 2014-06-24 MED ORDER — SENNA 8.6 MG PO TABS
1.0000 | ORAL_TABLET | Freq: Two times a day (BID) | ORAL | Status: DC
  Administered 2014-06-24 – 2014-06-25 (×3): 8.6 mg via ORAL
  Filled 2014-06-24 (×4): qty 1

## 2014-06-24 MED ORDER — ONDANSETRON HCL 4 MG/2ML IJ SOLN: 4.0000 mg | INTRAMUSCULAR | Status: DC | PRN

## 2014-06-24 MED ORDER — PROPOFOL 10 MG/ML IV BOLUS
INTRAVENOUS | Status: AC
  Filled 2014-06-24: qty 20

## 2014-06-24 MED ORDER — CEFAZOLIN SODIUM 1-5 GM-% IV SOLN
1.0000 g | Freq: Three times a day (TID) | INTRAVENOUS | Status: AC
  Administered 2014-06-24 – 2014-06-25 (×2): 1 g via INTRAVENOUS
  Filled 2014-06-24 (×2): qty 50

## 2014-06-24 MED ORDER — BUPIVACAINE HCL (PF) 0.5 % IJ SOLN
INTRAMUSCULAR | Status: DC | PRN
  Administered 2014-06-24: 10 mL

## 2014-06-24 MED ORDER — LACTATED RINGERS IV SOLN
INTRAVENOUS | Status: DC
  Administered 2014-06-24 (×2): via INTRAVENOUS

## 2014-06-24 MED ORDER — GLYCOPYRROLATE 0.2 MG/ML IJ SOLN
INTRAMUSCULAR | Status: AC
  Filled 2014-06-24: qty 3

## 2014-06-24 MED ORDER — HEMOSTATIC AGENTS (NO CHARGE) OPTIME
TOPICAL | Status: DC | PRN
  Administered 2014-06-24: 1 via TOPICAL

## 2014-06-24 MED ORDER — MENTHOL 3 MG MT LOZG: 1.0000 | LOZENGE | OROMUCOSAL | Status: DC | PRN

## 2014-06-24 MED ORDER — SUCCINYLCHOLINE CHLORIDE 20 MG/ML IJ SOLN
INTRAMUSCULAR | Status: AC
  Filled 2014-06-24: qty 1

## 2014-06-24 MED ORDER — VITAMIN B-6 100 MG PO TABS
100.0000 mg | ORAL_TABLET | Freq: Every day | ORAL | Status: DC
  Administered 2014-06-24 – 2014-06-25 (×2): 100 mg via ORAL
  Filled 2014-06-24 (×2): qty 1

## 2014-06-24 MED ORDER — EPHEDRINE SULFATE 50 MG/ML IJ SOLN
INTRAMUSCULAR | Status: AC
  Filled 2014-06-24: qty 1

## 2014-06-24 MED ORDER — ACETAMINOPHEN 160 MG/5ML PO SOLN
325.0000 mg | ORAL | Status: DC | PRN
  Filled 2014-06-24: qty 20.3

## 2014-06-24 MED ORDER — LIDOCAINE HCL (CARDIAC) 20 MG/ML IV SOLN
INTRAVENOUS | Status: AC
  Filled 2014-06-24: qty 10

## 2014-06-24 MED ORDER — HYDROMORPHONE HCL 1 MG/ML IJ SOLN
0.2500 mg | INTRAMUSCULAR | Status: DC | PRN
  Administered 2014-06-24: 0.5 mg via INTRAVENOUS

## 2014-06-24 MED ORDER — NEOSTIGMINE METHYLSULFATE 10 MG/10ML IV SOLN
INTRAVENOUS | Status: AC
  Filled 2014-06-24: qty 1

## 2014-06-24 MED ORDER — B COMPLEX-C PO TABS
1.0000 | ORAL_TABLET | Freq: Every day | ORAL | Status: DC
  Administered 2014-06-24 – 2014-06-25 (×2): 1 via ORAL
  Filled 2014-06-24 (×2): qty 1

## 2014-06-24 MED ORDER — HYDROMORPHONE HCL 2 MG PO TABS: 2.0000 mg | ORAL_TABLET | Freq: Every day | ORAL | Status: DC | PRN

## 2014-06-24 MED ORDER — MORPHINE SULFATE 2 MG/ML IJ SOLN
1.0000 mg | INTRAMUSCULAR | Status: DC | PRN
  Administered 2014-06-24: 4 mg via INTRAVENOUS
  Filled 2014-06-24: qty 2

## 2014-06-24 MED ORDER — DOXAZOSIN MESYLATE 4 MG PO TABS
4.0000 mg | ORAL_TABLET | Freq: Every day | ORAL | Status: DC
  Administered 2014-06-24: 4 mg via ORAL
  Filled 2014-06-24 (×2): qty 1

## 2014-06-24 MED ORDER — FENTANYL CITRATE 0.05 MG/ML IJ SOLN
INTRAMUSCULAR | Status: DC | PRN
  Administered 2014-06-24: 150 ug via INTRAVENOUS

## 2014-06-24 MED ORDER — DEXAMETHASONE SODIUM PHOSPHATE 4 MG/ML IJ SOLN
INTRAMUSCULAR | Status: DC | PRN
  Administered 2014-06-24: 10 mg via INTRAVENOUS

## 2014-06-24 MED ORDER — NEOSTIGMINE METHYLSULFATE 10 MG/10ML IV SOLN
INTRAVENOUS | Status: DC | PRN
  Administered 2014-06-24: 4 mg via INTRAVENOUS

## 2014-06-24 MED ORDER — ADULT MULTIVITAMIN W/MINERALS CH
1.0000 | ORAL_TABLET | Freq: Every day | ORAL | Status: DC
  Administered 2014-06-24 – 2014-06-25 (×2): 1 via ORAL
  Filled 2014-06-24 (×2): qty 1

## 2014-06-24 MED ORDER — FLEET ENEMA 7-19 GM/118ML RE ENEM
1.0000 | ENEMA | Freq: Once | RECTAL | Status: AC | PRN
  Filled 2014-06-24: qty 1

## 2014-06-24 MED ORDER — ARTIFICIAL TEARS OP OINT
TOPICAL_OINTMENT | OPHTHALMIC | Status: AC
  Filled 2014-06-24: qty 3.5

## 2014-06-24 MED ORDER — LIDOCAINE-EPINEPHRINE 1 %-1:100000 IJ SOLN
INTRAMUSCULAR | Status: DC | PRN
  Administered 2014-06-24: 10 mL

## 2014-06-24 MED ORDER — TIZANIDINE HCL 2 MG PO TABS
2.0000 mg | ORAL_TABLET | Freq: Three times a day (TID) | ORAL | Status: DC | PRN
  Filled 2014-06-24: qty 1

## 2014-06-24 MED ORDER — HYDROCODONE-ACETAMINOPHEN 5-325 MG PO TABS: 1.0000 | ORAL_TABLET | ORAL | Status: DC | PRN

## 2014-06-24 MED ORDER — ACETAMINOPHEN 650 MG RE SUPP: 650.0000 mg | RECTAL | Status: DC | PRN

## 2014-06-24 SURGICAL SUPPLY — 80 items
BAG DECANTER FOR FLEXI CONT (MISCELLANEOUS) ×2 IMPLANT
BENZOIN TINCTURE PRP APPL 2/3 (GAUZE/BANDAGES/DRESSINGS) IMPLANT
BIT DRILL NEURO 2X3.1 SFT TUCH (MISCELLANEOUS) IMPLANT
BIT DRILL VUEPOINT II (BIT) ×1 IMPLANT
BLADE SURG 11 STRL SS (BLADE) ×2 IMPLANT
BLADE SURG ROTATE 9660 (MISCELLANEOUS) IMPLANT
BLADE ULTRA TIP 2M (BLADE) IMPLANT
BONE MATRIX OSTEOCEL PRO MED (Bone Implant) ×2 IMPLANT
BUR PRECISION FLUTE 5.0 (BURR) IMPLANT
CANISTER SUCT 3000ML (MISCELLANEOUS) ×2 IMPLANT
CONT SPEC 4OZ CLIKSEAL STRL BL (MISCELLANEOUS) ×2 IMPLANT
DERMABOND ADHESIVE PROPEN (GAUZE/BANDAGES/DRESSINGS) ×2
DERMABOND ADVANCED .7 DNX6 (GAUZE/BANDAGES/DRESSINGS) ×2 IMPLANT
DRAPE C-ARM 42X72 X-RAY (DRAPES) ×4 IMPLANT
DRAPE LAPAROTOMY 100X72 PEDS (DRAPES) ×2 IMPLANT
DRAPE MICROSCOPE LEICA (MISCELLANEOUS) IMPLANT
DRAPE POUCH INSTRU U-SHP 10X18 (DRAPES) ×2 IMPLANT
DRILL BIT VUEPOINT II (BIT) ×1
DRILL NEURO 2X3.1 SOFT TOUCH (MISCELLANEOUS)
DRSG OPSITE POSTOP 4X6 (GAUZE/BANDAGES/DRESSINGS) ×2 IMPLANT
DRSG TELFA 3X8 NADH (GAUZE/BANDAGES/DRESSINGS) ×2 IMPLANT
DURAPREP 6ML APPLICATOR 50/CS (WOUND CARE) ×2 IMPLANT
ELECT REM PT RETURN 9FT ADLT (ELECTROSURGICAL) ×2
ELECTRODE REM PT RTRN 9FT ADLT (ELECTROSURGICAL) ×1 IMPLANT
EVACUATOR 1/8 PVC DRAIN (DRAIN) ×2 IMPLANT
GAUZE SPONGE 4X4 12PLY STRL (GAUZE/BANDAGES/DRESSINGS) ×2 IMPLANT
GAUZE SPONGE 4X4 16PLY XRAY LF (GAUZE/BANDAGES/DRESSINGS) IMPLANT
GLOVE BIO SURGEON STRL SZ8 (GLOVE) ×2 IMPLANT
GLOVE BIOGEL PI IND STRL 7.5 (GLOVE) ×1 IMPLANT
GLOVE BIOGEL PI IND STRL 8 (GLOVE) ×5 IMPLANT
GLOVE BIOGEL PI IND STRL 8.5 (GLOVE) ×1 IMPLANT
GLOVE BIOGEL PI INDICATOR 7.5 (GLOVE) ×1
GLOVE BIOGEL PI INDICATOR 8 (GLOVE) ×5
GLOVE BIOGEL PI INDICATOR 8.5 (GLOVE) ×1
GLOVE ECLIPSE 7.5 STRL STRAW (GLOVE) ×12 IMPLANT
GLOVE ECLIPSE 8.0 STRL XLNG CF (GLOVE) ×2 IMPLANT
GLOVE EXAM NITRILE LRG STRL (GLOVE) IMPLANT
GLOVE EXAM NITRILE MD LF STRL (GLOVE) IMPLANT
GLOVE EXAM NITRILE XL STR (GLOVE) IMPLANT
GLOVE EXAM NITRILE XS STR PU (GLOVE) IMPLANT
GOWN STRL REUS W/ TWL LRG LVL3 (GOWN DISPOSABLE) IMPLANT
GOWN STRL REUS W/ TWL XL LVL3 (GOWN DISPOSABLE) ×1 IMPLANT
GOWN STRL REUS W/TWL 2XL LVL3 (GOWN DISPOSABLE) ×4 IMPLANT
GOWN STRL REUS W/TWL LRG LVL3 (GOWN DISPOSABLE)
GOWN STRL REUS W/TWL XL LVL3 (GOWN DISPOSABLE) ×1
HEMOSTAT SURGICEL 2X14 (HEMOSTASIS) IMPLANT
KIT BASIN OR (CUSTOM PROCEDURE TRAY) ×2 IMPLANT
KIT ROOM TURNOVER OR (KITS) ×2 IMPLANT
MARKER SKIN DUAL TIP RULER LAB (MISCELLANEOUS) ×2 IMPLANT
MILL MEDIUM DISP (BLADE) ×2 IMPLANT
NEEDLE HYPO 18GX1.5 BLUNT FILL (NEEDLE) IMPLANT
NEEDLE HYPO 25X1 1.5 SAFETY (NEEDLE) ×2 IMPLANT
NEEDLE SPNL 22GX3.5 QUINCKE BK (NEEDLE) ×2 IMPLANT
NS IRRIG 1000ML POUR BTL (IV SOLUTION) ×2 IMPLANT
PACK LAMINECTOMY NEURO (CUSTOM PROCEDURE TRAY) ×2 IMPLANT
PAD ABD 8X10 STRL (GAUZE/BANDAGES/DRESSINGS) IMPLANT
PIN MAYFIELD SKULL DISP (PIN) ×2 IMPLANT
ROD VUEPOINT 80MM (Rod) ×4 IMPLANT
RUBBERBAND STERILE (MISCELLANEOUS) IMPLANT
SCREW MA MM 3.5X12 (Screw) ×16 IMPLANT
SCREW SET THREADED (Screw) ×20 IMPLANT
SCREW VUEPOINT II 3.5X12 FA (Screw) ×4 IMPLANT
SPONGE INTESTINAL PEANUT (DISPOSABLE) IMPLANT
SPONGE SURGIFOAM ABS GEL SZ50 (HEMOSTASIS) ×2 IMPLANT
STAPLER SKIN PROX WIDE 3.9 (STAPLE) ×2 IMPLANT
STRIP CLOSURE SKIN 1/2X4 (GAUZE/BANDAGES/DRESSINGS) IMPLANT
SUT ETHILON 3 0 FSL (SUTURE) IMPLANT
SUT VIC AB 0 CT1 18XCR BRD8 (SUTURE) ×2 IMPLANT
SUT VIC AB 0 CT1 8-18 (SUTURE) ×2
SUT VIC AB 2-0 CP2 18 (SUTURE) ×4 IMPLANT
SUT VIC AB 2-0 CT1 18 (SUTURE) IMPLANT
SUT VIC AB 3-0 SH 8-18 (SUTURE) ×4 IMPLANT
SYR 20ML ECCENTRIC (SYRINGE) ×2 IMPLANT
SYR 3ML LL SCALE MARK (SYRINGE) IMPLANT
TOWEL OR 17X24 6PK STRL BLUE (TOWEL DISPOSABLE) ×2 IMPLANT
TOWEL OR 17X26 10 PK STRL BLUE (TOWEL DISPOSABLE) ×2 IMPLANT
TRAP SPECIMEN MUCOUS 40CC (MISCELLANEOUS) ×2 IMPLANT
TRAY FOLEY CATH 14FRSI W/METER (CATHETERS) IMPLANT
UNDERPAD 30X30 INCONTINENT (UNDERPADS AND DIAPERS) IMPLANT
WATER STERILE IRR 1000ML POUR (IV SOLUTION) ×2 IMPLANT

## 2014-06-24 NOTE — Transfer of Care (Signed)
Immediate Anesthesia Transfer of Care Note  Patient: Aaron Schmidt  Procedure(s) Performed: Procedure(s) with comments: Posterior Cervical Three-Seven Fusion with Lateral Mass Fixation (N/A) - C3-C7 posterior cervical fusion with lateral mass fixation  Patient Location: PACU  Anesthesia Type:General  Level of Consciousness: awake, alert  and oriented  Airway & Oxygen Therapy: Patient Spontanous Breathing and Patient connected to nasal cannula oxygen  Post-op Assessment: Report given to PACU RN and Post -op Vital signs reviewed and stable  Post vital signs: Reviewed and stable  Complications: No apparent anesthesia complications

## 2014-06-24 NOTE — Anesthesia Postprocedure Evaluation (Signed)
  Anesthesia Post-op Note  Patient: Aaron Schmidt  Procedure(s) Performed: Procedure(s) with comments: Posterior Cervical Three-Seven Fusion with Lateral Mass Fixation (N/A) - C3-C7 posterior cervical fusion with lateral mass fixation  Patient Location: PACU  Anesthesia Type:General  Level of Consciousness: awake and alert   Airway and Oxygen Therapy: Patient Spontanous Breathing and Patient connected to nasal cannula oxygen  Post-op Pain: moderate  Post-op Assessment: Post-op Vital signs reviewed, Patient's Cardiovascular Status Stable, Respiratory Function Stable, Patent Airway, No signs of Nausea or vomiting and Pain level controlled  Post-op Vital Signs: Reviewed and stable  Last Vitals:  Filed Vitals:   06/24/14 1427  BP: 127/68  Pulse: 88  Temp: 36.7 C  Resp: 16    Complications: No apparent anesthesia complications

## 2014-06-24 NOTE — Progress Notes (Signed)
Pt. Refuses CPAP at this time due to c-collar. Pt. States that he doesn't wear CPAP at home.

## 2014-06-24 NOTE — H&P (Signed)
> [address] [place], [place] [address] Phone: [phone]   Patient ID:   [id number] Patient: Aaron Schmidt  Date of Birth: 09-28-1932 Visit Type: Office Visit   Date: 06/14/2014 11:30 AM Provider: Marchia Meiers. Vertell Limber MD   This 80 year old male presents for Follow Up of back pain and Follow Up of neck pain.  History of Present Illness: 1.  Follow Up of back pain  2.  Follow Up of neck pain  [name] visits to discuss planned posterior cervical decompression & fusion C3-C7 and be fitted for a Vista Collar.      Medical/Surgical/Interim History Reviewed, no change.  Last detailed document date:07/15/2013.   PAST MEDICAL HISTORY, SURGICAL HISTORY, FAMILY HISTORY, SOCIAL HISTORY AND REVIEW OF SYSTEMS I have reviewed the patient's past medical, surgical, family and social history as well as the comprehensive review of systems as included on the [facility] history form dated 05/12/2014, which I have signed.  Family History: Reviewed, no changes.  Last detailed document: 07/15/2013.  Patient reports there is no relevant family history.   Social History: Tobacco use reviewed. Reviewed, no changes. Last detailed document date: 07/15/2013.      MEDICATIONS(added, continued or stopped this visit):   Started Medication Directions Instruction Stopped  05/31/2014 Dilaudid 2 mg tablet take 1 tablet by oral route 4  times every day as needed DO NOT FILL UNTIL 05/31/2014     doxazosin 4 mg tablet      L-Lysine 500 mg capsule      Lyrica 150 mg capsule      nortriptyline 25 mg capsule take 2 capsule by oral route  every day     simvastatin 40 mg tablet     05/26/2014 tizanidine 2 mg tablet take 2 tablet by oral route  every 8 hours as needed not to exceed 3 doses in 24 hours     triamterene 37.5 mg-hydrochlorothiazide 25 mg capsule       ALLERGIES:  Ingredient Reaction Medication Name Comment  DIAZEPAM  Valium   Reviewed, no  changes.   Vitals Date Temp F BP Pulse Ht In Wt Lb BMI BSA Pain Score  06/14/2014  109/62 91 70 207 29.7  6/10        IMPRESSION [name] visits to discuss his upcoming surgery & be fitted for a [employer]. He verbalizes understanding of planned C3-C7 posterior cervical decompression and fusion and wiahes to proceed.    Completed Orders (this encounter) Order Details Reason Side Interpretation Result Initial Treatment Date Region  Lifestyle education regarding diet Encouraged to eat a well balanced diet and follow up with primary care physician.         Assessment/Plan # Detail Type Description   1. Assessment Cervical spondylosis w/ myelopathy (721.1).       2. Assessment Neck pain (723.1).       3. Assessment Cervical spinal stenosis (723.0).   Plan Orders Hard Cervical Collar.       4. Assessment BMI 29.0-29.9,ADULT (V85.25).   Plan Orders Today's instructions / counseling include(s) Lifestyle education regarding diet.         Pain Assessment/Treatment Pain Scale: 6/10. Method: Numeric Pain Intensity Scale. Location: back. Onset: 10/23/2013. Duration: varies. Quality: discomforting. Pain Assessment/Treatment follow-up plan of care: Patient is currently taking medication for pain as prescribed..  Fall Risk Plan The patient has not fallen in the last year.  Peri- & post-op expectations reviewed, with Mr. Shakoor verbalizng  understanding. Post-op / discharge instructions given & reviewed.  Surgery scheduled for October 1st.   Orders: Office Procedures/Services: Assessment Service Comments  723.0 Hard Cervical Collar    Instruction(s)/Education: Assessment Instruction  V85.25 Lifestyle education regarding diet             Provider:  Marchia Meiers. Vertell Limber MD  06/14/2014 02:28 PM Dictation edited by: Mike Craze. Poteat    CC Providers: Hulan Fess [address] [place],  [place]  [address]-   Peter Whitfield [facility] [address] [place], [place] [address]- ----------------------------------------------------------------------------------------------------------------------------------------------------------------------         Electronically signed by Marchia Meiers Vertell Limber MD on 06/17/2014 05:50 PM  > [place], [place] [address] Phone: [phone]   Patient ID:   [id number] Patient: Aaron Schmidt  Date of Birth: December 03, 1932 Visit Type: Office Visit   Date: 05/26/2014 09:00 AM Provider: Spero Geralds PAC  Historian: self  This 80 year old male presents for back pain.  History of Present Illness: 1.  back pain  [name] returns to the clinic today after last being seen on August 18 by Dr. Vertell Limber at which time he ordered an MRI.  Dr. Vertell Limber actually reviewed the MRI with the patient in the office today and they have decided to proceed with surgery.  The patient is currently utilizing Dilaudid 2 mg 4 times a day as well as tizanidine 2 mg 3 times a day.  Today he returns essentially for medication refills until his surgery time.  He would like to do some thinking on the surgery and then return to discuss his options with Dr. Vertell Limber over the next month.  He continues to have increasing pain in his neck.  He was found to have some areas of stenosis in his cervical spine.  Range of motion is somewhat limited due to pain.  They he reports his pain is a 4 out of 10 however he has had episodes of severe pain in the past several weeks.  Unfortunately there was some confusion when he called several days ago.  We were under the impression he was asking to increase his Dilaudid or change pain medications but he was actually wanting a prednisone Dosepak.  He finds a prednisone Dosepak to be quite helpful for acute flares of pain.  Since then that pain has resolved.  We did review today that we do not want to escalate his narcotic use prior to his surgery as it can make it  more difficult for them to control his postoperative pain.      Medical/Surgical/Interim History Reviewed, no change.  Last detailed document date:07/15/2013.   PAST MEDICAL HISTORY, SURGICAL HISTORY, FAMILY HISTORY, SOCIAL HISTORY AND REVIEW OF SYSTEMS I have reviewed the patient's past medical, surgical, family and social history as well as the comprehensive review of systems as included on the [facility] history form dated 05/12/2014, which I have signed.  Family History: Reviewed, no changes.  Last detailed document: 07/15/2013.  Patient reports there is no relevant family history.   Social History: Tobacco use reviewed. Reviewed, no changes. Last detailed document date: 07/15/2013.      MEDICATIONS(added, continued or stopped this visit):   Started Medication Directions Instruction Stopped  05/01/2014 Dilaudid 2 mg tablet take 1 tablet by oral route 4  times every day as needed DO NOT FILL UNTIL 05/01/2014  05/26/2014   doxazosin 4 mg tablet  L-Lysine 500 mg capsule      Lyrica 150 mg capsule      nortriptyline 25 mg capsule take 2 capsule by oral route  every day     simvastatin 40 mg tablet     05/26/2014 tizanidine 2 mg tablet take 2 tablet by oral route  every 8 hours as needed not to exceed 3 doses in 24 hours    03/12/2014 tizanidine 2 mg tablet take 2 tablet by oral route  every 8 hours as needed not to exceed 3 doses in 24 hours  05/26/2014   triamterene 37.5 mg-hydrochlorothiazide 25 mg capsule       ALLERGIES:  Ingredient Reaction Medication Name Comment  DIAZEPAM  Valium   Reviewed, no changes.  REVIEW OF SYSTEMS System Neg/Pos Details  Constitutional Negative Chills, fatigue, fever, malaise, night sweats, weight gain and weight loss.  ENMT Negative Ear drainage, hearing loss, nasal drainage, otalgia, sinus pressure and sore throat.  Eyes Negative Eye discharge, eye pain and vision changes.  Respiratory Negative Chronic  cough, cough, dyspnea, known TB exposure and wheezing.  Cardio Negative Chest pain, claudication, edema and irregular heartbeat/palpitations.  GI Negative Abdominal pain, blood in stool, change in stool pattern, constipation, decreased appetite, diarrhea, heartburn, nausea and vomiting.  GU Negative Dysuria, hematuria, hot flashes, irregular menses, polyuria, urinary frequency, urinary incontinence and urinary retention.  Endocrine Negative Cold intolerance, heat intolerance, polydipsia and polyphagia.  Neuro Negative Dizziness, extremity weakness, gait disturbance, headache, memory impairment, numbness in extremity, seizures and tremors.  Psych Negative Anxiety, depression and insomnia.  Integumentary Negative Brittle hair, brittle nails, change in shape/size of mole(s), hair loss, hirsutism, hives, pruritus, rash and skin lesion.  MS Positive Back pain.  MS Negative Joint pain, joint swelling, muscle weakness and neck pain.  Hema/Lymph Negative Easy bleeding, easy bruising and lymphadenopathy.  Allergic/Immuno Negative Contact allergy, environmental allergies, food allergies and seasonal allergies.  Reproductive Negative Breast discharge, breast lump(s), dysmenorrhea, dyspareunia, history of abnormal PAP smear and vaginal discharge.    Vitals Date Temp F BP Pulse Ht In Wt Lb BMI BSA Pain Score  05/26/2014  121/74 81 70 203 29.13  4/10     PHYSICAL EXAM General General Appearance: normal Mood/Affect: normal Orientation: normal Pulses/Edema: normal Gait/Station: antalgic Coordination: normal Respiratory: non-labored during exam Pupils: equal, anicteric Level of Distress: mild distress Overall Appearance: Normal  Cardiovascular Cardiac: normal  Respiratory Lungs: normal  Neurological Orientation: normal Recent and Remote Memory: normal Attention Span and Concentration:   normal Language: normal Fund of Knowledge: normal  Right Left Sensation: normal normal Upper  Extremity Coordination: normal normal  Lower Extremity Coordination: normal normal  Musculoskeletal Gait and Station:   Right Left Upper Extremity Muscle Strength: normal normal Lower Extremity Muscle Strength: normal normal Upper Extremity Muscle Tone:  normal normal Lower Extremity Muscle Tone: normal normal  Stability Cervical Spine: motion is with pain, tenderness Thoracic Spine: normal Lumbar Spine: motion is with pain, tenderness Right Upper Extremity: normal Left Upper Extremity: normal Right Lower Extremity: normal Left Lower Extremity: normal  Range of Motion Cervical Spine: all moderate, decreased range of motion Thoracic Spine: normal Lumbar Spine: all mild, decreased range of motion Right Upper Extremity: normal Left Upper Extremity: normal Right Lower Extremity: normal Left Lower Extremity: normal         IMPRESSION Cervical stenosis Lumbar stenosis with radiculopathy  Completed Orders (this encounter) Order Details Reason Side Interpretation Result Initial Treatment Date Region  Lifestyle education regarding diet Continue to eat  a heart, healthy diet.         Assessment/Plan # Detail Type Description   1. 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       2. Assessment Lumbar spinal stenosis (724.02).       3. Assessment Radiculopathy / Lumbar (724.4).       4. Assessment BMI 29.0-29.9,ADULT (V85.25).   Plan Orders Today's instructions / counseling include(s) Lifestyle education regarding diet.         Pain Assessment/Treatment Pain Scale: 4/10. Method: Numeric Pain Intensity Scale. Location: back. Onset: 10/23/2013. Duration: varies. Quality: sharp, shooting. Pain Assessment/Treatment follow-up plan of care: Continue to take medication as prescribed..  Fall Risk Plan The patient has not fallen in the last year.  Refilled the patient's Dilaudid and tizanidine for 3 month supply.  He is aware that they may need to  change around his medications to manage his postoperative pain.  He should utilize the medications Dr. Vertell Limber prescribes and stop any medications we give him unless told to continue by Dr. Vertell Limber.    He will follow up in 3 months.  He knows to call the office if he has any further questions or concerns.  Orders: Diagnostic Procedures: Assessment Procedure  724.4 Return to Clinic  Instruction(s)/Education: Assessment Instruction  V85.25 Lifestyle education regarding diet    MEDICATIONS PRESCRIBED TODAY    Rx Quantity Refills  TIZANIDINE HCL 2 mg  90 2            Provider:  Spero Geralds [facility]  05/26/2014 01:07 PM  Under the supervision of Bonna Gains PhD MD Dictation edited by: Rollen Sox    CC Providers: Hulan Fess [address] [place],  [place]  [address]-   Peter Whitfield [facility] [address] [place], [place] [address]- ----------------------------------------------------------------------------------------------------------------------------------------------------------------------         Electronically signed by Spero Geralds PAC on 05/26/2014 01:08 PM  > [place], [place] [address] Phone: [phone]   Patient ID:   [id number] Patient: Aaron Schmidt  Date of Birth: July 04, 1933 Visit Type: Office Visit   Date: 05/12/2014 02:45 PM Provider: Marchia Meiers. Vertell Limber MD   This 80 year old male presents for Follow Up of back pain and Follow Up of neck pain.  History of Present Illness: 1.  Follow Up of back pain  2.  Follow Up of neck pain  Mr Lacivita returns, having seen DrHarkins & Spero Geralds PA-C for SCS trial & subsequent follow up.  He reports the trial failed, experiencing "tingling all over".  Since the trial, he notes return of tingling sensation in neck up to ears bilat, BUE, and BLE.  recently, his lumbar pain has increased as well.  Dilaudid 2mg  QID Tizanidine 2mg   TID  Plain films c-spine on [place]  Patient still has his baseline left hip and leg pain.  He says he is leaning forward with his head into the right and is now being complaining of tingling into his right shoulder and increasingly severe neck pain.  I examined him and his strength is full on confrontational testing in his upper extremities reflexes are 2 and great toes are downgoing to plantar stimulation.  He has no pathologic reflexes.  He has a mildly positive Spurling maneuver to the right.      Medical/Surgical/Interim History Reviewed, no change.  Last detailed document date:07/15/2013.   PAST MEDICAL HISTORY, SURGICAL HISTORY, FAMILY HISTORY, SOCIAL HISTORY AND REVIEW OF  SYSTEMS I have reviewed the patient's past medical, surgical, family and social history as well as the comprehensive review of systems as included on the [facility] history form dated 05/12/2014, which I have signed.  Family History: Reviewed, no changes.  Last detailed document: 07/15/2013.  Patient reports there is no relevant family history.   Social History: Tobacco use reviewed. Reviewed, no changes. Last detailed document date: 07/15/2013.      MEDICATIONS(added, continued or stopped this visit):   Started Medication Directions Instruction Stopped   amitriptyline 100 mg tablet take 1/2 tablet by oral route  every day at bedtime  05/12/2014  03/03/2014 Dilaudid 2 mg tablet take 1 tablet by oral route 4  times every day as needed  05/12/2014  04/01/2014 Dilaudid 2 mg tablet take 1 tablet by oral route 4  times every day as needed DO NOT FILL UNTIL 04/01/2014  05/12/2014  05/01/2014 Dilaudid 2 mg tablet take 1 tablet by oral route 4  times every day as needed DO NOT FILL UNTIL 05/01/2014     doxazosin 4 mg tablet      L-Lysine 500 mg capsule      Lyrica 150 mg capsule      nortriptyline 25 mg capsule take 2 capsule by oral route  every day     simvastatin 40 mg tablet      03/12/2014 tizanidine 2 mg tablet take 2 tablet by oral route  every 8 hours as needed not to exceed 3 doses in 24 hours     triamterene 37.5 mg-hydrochlorothiazide 25 mg capsule       ALLERGIES:  Ingredient Reaction Medication Name Comment  DIAZEPAM  Valium   Reviewed, no changes.   Vitals Date Temp F BP Pulse Ht In Wt Lb BMI BSA Pain Score  05/12/2014  114/71 92 70 207 29.7  5/10      DIAGNOSTIC RESULTS Cervical radiographs show multilevel spondylosis with large ventral osteophytes throughout the cervical spine.    IMPRESSION The patient's complaints of neck pain and tingling after spinal cord stimulator trial is unusual.  Nonetheless he expresses concern that he may have developed this following positioning for the trial and this might be related to cervical stenosis and spondylosis.  I have suggested that we get an MRI of the cervical spine to make sure he does not have significant cord compression.  Assessment/Plan # Detail Type Description   1. Assessment Neck pain (723.1).       2. 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       3. Assessment Postop Laminectomy Pain/lumbar (722.83).       4. Assessment Radiculopathy / Lumbar (724.4).         Pain Assessment/Treatment Pain Scale: 5/10. Method: Numeric Pain Intensity Scale. Location: back/neck. Onset: 10/23/2013. Duration: varies. Quality: discomforting. Pain Assessment/Treatment follow-up plan of care: Patient is currently taking medication for pain as prescribed..  Fall Risk Plan The Patient has fallen 2 times in the last year.  Falls risk follow-up plan of care: Assisted devices: Advised patient to use handrails..  Patient will get cervical MRI and then come back to see me.  Orders: Diagnostic Procedures: Assessment Procedure  721.0 MRI Spinal/cerv W/o Contrast             Provider:  Marchia Meiers. Vertell Limber MD  05/15/2014 12:45 PM Dictation edited by: Marchia Meiers. Vertell Limber    CC  Providers: Hulan Fess [address] [place],  [place]  [address]-  Joni Fears [facility] [address] [place], [place] [address]- ----------------------------------------------------------------------------------------------------------------------------------------------------------------------         Electronically signed by Marchia Meiers Vertell Limber MD on 05/15/2014 12:45 PM

## 2014-06-24 NOTE — Anesthesia Preprocedure Evaluation (Signed)
Anesthesia Evaluation  Patient identified by MRN, date of birth, ID band Patient awake    Reviewed: Allergy & Precautions, H&P , NPO status , Patient's Chart, lab work & pertinent test results  History of Anesthesia Complications Negative for: history of anesthetic complications  Airway Mallampati: III TM Distance: <3 FB Neck ROM: Limited  Mouth opening: Limited Mouth Opening  Dental  (+) Teeth Intact   Pulmonary neg shortness of breath, sleep apnea and Continuous Positive Airway Pressure Ventilation , neg COPDneg recent URI, former smoker,  Severe osa non compliant cpap breath sounds clear to auscultation        Cardiovascular hypertension, - angina- CAD, - Past MI and - CHF - dysrhythmias Rhythm:Regular  lbbb w/o angina, edema, or sob   Neuro/Psych Neck and shoulder pain negative psych ROS   GI/Hepatic negative GI ROS, Neg liver ROS,   Endo/Other  negative endocrine ROS  Renal/GU negative Renal ROS     Musculoskeletal  (+) Arthritis -, Osteoarthritis,    Abdominal   Peds  Hematology negative hematology ROS (+)   Anesthesia Other Findings   Reproductive/Obstetrics                           Anesthesia Physical Anesthesia Plan  ASA: II  Anesthesia Plan: General   Post-op Pain Management:    Induction: Intravenous  Airway Management Planned: Video Laryngoscope Planned  Additional Equipment: None  Intra-op Plan:   Post-operative Plan: Extubation in OR and Possible Post-op intubation/ventilation  Informed Consent: I have reviewed the patients History and Physical, chart, labs and discussed the procedure including the risks, benefits and alternatives for the proposed anesthesia with the patient or authorized representative who has indicated his/her understanding and acceptance.   Dental advisory given  Plan Discussed with: CRNA and Surgeon  Anesthesia Plan Comments:          Anesthesia Quick Evaluation

## 2014-06-24 NOTE — Anesthesia Procedure Notes (Signed)
Procedure Name: Intubation Date/Time: 06/24/2014 10:36 AM Performed by: [place] Pink Pre-anesthesia Checklist: Patient identified, Emergency Drugs available, Suction available, Patient being monitored and Timeout performed Patient Re-evaluated:Patient Re-evaluated prior to inductionOxygen Delivery Method: Circle system utilized Preoxygenation: Pre-oxygenation with 100% oxygen Intubation Type: IV induction Ventilation: Mask ventilation without difficulty and Oral airway inserted - appropriate to patient size Grade View: Grade II Tube type: Oral Tube size: 7.0 mm Number of attempts: 1 Airway Equipment and Method: Video-laryngoscopy,  Rigid stylet and LTA kit utilized Placement Confirmation: ETT inserted through vocal cords under direct vision,  positive ETCO2 and breath sounds checked- equal and bilateral Secured at: 21 cm Tube secured with: Tape Dental Injury: Teeth and Oropharynx as per pre-operative assessment

## 2014-06-24 NOTE — Evaluation (Signed)
Physical Therapy Evaluation Patient Details Name: Aaron Schmidt MRN: [id number] DOB: 04-29-1933 Today's Date: 06/24/2014   History of Present Illness  pt admitted for multi-level posterior cervical fusion C2-7  Clinical Impression  Pt admitted with/for mult-level cervical fusion.  Pt currently limited functionally due to the problems listed below.  (see problems list.)  Pt will benefit from PT to maximize function and safety to be able to get home safely with available assist of family.     Follow Up Recommendations No PT follow up;Supervision for mobility/OOB    Equipment Recommendations  None recommended by PT    Recommendations for Other Services       Precautions / Restrictions Precautions Precautions: Cervical Required Braces or Orthoses: Cervical Brace Cervical Brace: Hard collar;At all times Restrictions Weight Bearing Restrictions: No      Mobility  Bed Mobility Overal bed mobility: Needs Assistance Bed Mobility: Rolling;Sidelying to Sit Rolling: Min guard Sidelying to sit: Min guard       General bed mobility comments: cues for best technique  Transfers Overall transfer level: Needs assistance Equipment used: None Transfers: Sit to/from Stand Sit to Stand: Min assist         General transfer comment: cues for hand placement; steady assist  Ambulation/Gait Ambulation/Gait assistance: Min guard Ambulation Distance (Feet): 200 Feet Assistive device: None Gait Pattern/deviations: Step-through pattern Gait velocity: slower   General Gait Details: mildly unsteady, but able to self correct without assist  Stairs            Wheelchair Mobility    Modified Rankin (Stroke Patients Only)       Balance Overall balance assessment: Needs assistance Sitting-balance support: No upper extremity supported Sitting balance-Leahy Scale: Good     Standing balance support: No upper extremity supported Standing balance-Leahy Scale: Fair                                Pertinent Vitals/Pain Pain Assessment: Faces Faces Pain Scale: Hurts little more Pain Location: incision Pain Descriptors / Indicators: Constant Pain Intervention(s): Premedicated before session    Home Living Family/patient expects to be discharged to:: Private residence Living Arrangements: Spouse/significant other Available Help at Discharge: Family;Available 24 hours/day Type of Home: House Home Access: Stairs to enter Entrance Stairs-Rails: Right;Left   Home Layout: Multi-level Home Equipment: Crutches;Cane - single point;Walker - 2 wheels      Prior Function Level of Independence: Independent         Comments: supposed to be using cane but doesn't     Hand Dominance        Extremity/Trunk Assessment               Lower Extremity Assessment: Overall WFL for tasks assessed         Communication   Communication: No difficulties  Cognition Arousal/Alertness: Awake/alert Behavior During Therapy: WFL for tasks assessed/performed Overall Cognitive Status: Within Functional Limits for tasks assessed                      General Comments General comments (skin integrity, edema, etc.): Educated pt/wife on neck care/precautions, brace care, logroll, lifting restrictions, and typical progression of activity.    Exercises        Assessment/Plan    PT Assessment Patient needs continued PT services  PT Diagnosis Acute pain;Abnormality of gait   PT Problem List Decreased activity tolerance;Decreased mobility;Decreased knowledge of precautions;Decreased knowledge of  use of DME;Pain  PT Treatment Interventions Gait training;DME instruction;Stair training;Functional mobility training;Therapeutic activities;Patient/family education   PT Goals (Current goals can be found in the Care Plan section) Acute Rehab PT Goals Patient Stated Goal: independent PT Goal Formulation: With patient Time For Goal Achievement:  06/26/14 Potential to Achieve Goals: Good    Frequency Min 5X/week   Barriers to discharge        Co-evaluation               End of Session Equipment Utilized During Treatment: Cervical collar Activity Tolerance: Patient tolerated treatment well Patient left: in chair;with call bell/phone within reach;with family/visitor present Nurse Communication: Mobility status         Time: 1725-1748 PT Time Calculation (min): 23 min   Charges:   PT Evaluation $Initial PT Evaluation Tier I: 1 Procedure PT Treatments $Gait Training: 8-22 mins   PT G Codes:          Mottinger, Tessie Fass 06/24/2014, 5:58 PM 06/24/2014  Donnella Sham, PT [phone]  (pager)

## 2014-06-24 NOTE — Progress Notes (Addendum)
PHARMACIST - PHYSICIAN ORDER COMMUNICATION  CONCERNING: P&T Medication Policy on Herbal Medications  DESCRIPTION:  This patient's order for:  Flaxseed oil and L-lysine has been noted.  This product(s) is classified as an "herbal" or natural product. Due to a lack of definitive safety studies or FDA approval, nonstandard manufacturing practices, plus the potential risk of unknown drug-drug interactions while on inpatient medications, the Pharmacy and Therapeutics Committee does not permit the use of "herbal" or natural products of this type within [facility].   ACTION TAKEN: The pharmacy department is unable to verify this order at this time and your patient has been informed of this safety policy. Please reevaluate patient's clinical condition at discharge and address if the herbal or natural product(s) should be resumed at that time.  Sherlon Handing, PharmD, BCPS Clinical pharmacist, pager [phone] 06/24/2014  2:36 PM

## 2014-06-24 NOTE — Progress Notes (Signed)
Awake, alert, conversant.  Full bilateral Deltoid, Biceps, Triceps, Hand Intrinsics.  MAEW.  Doing well.

## 2014-06-24 NOTE — Progress Notes (Signed)
Utilization review completed.  

## 2014-06-24 NOTE — Brief Op Note (Signed)
06/24/2014  1:16 PM  PATIENT:  Aaron Schmidt  80 y.o. male  PRE-OPERATIVE DIAGNOSIS:  Cervical spondylosis with myelopathy, Cervical stenosis, herniated cervical disc, cervical radiculopathy C 34, C 45, C 56, C 67 levels  POST-OPERATIVE DIAGNOSIS:   Cervical spondylosis with myelopathy, Cervical stenosis, herniated cervical disc, cervical radiculopathy C 34, C 45, C 56, C 67 levels   PROCEDURE:  Procedure(s) with comments: Posterior Cervical Three-Seven Fusion with Lateral Mass Fixation (N/A) - C3-C7 posterior cervical fusion with lateral mass fixation C 2 - C 7 decompression with posterolateral arthrodesis C 3 - C 7 levels  SURGEON:  Surgeon(s) and Role:    * Erline Levine, MD - Primary    * Ashok Pall, MD - Assisting  PHYSICIAN ASSISTANT:   ASSISTANTS: Poteat, RN   ANESTHESIA:   general  EBL:  Total I/O In: 1500 [I.V.:1500] Out: 150 [Blood:150]  BLOOD ADMINISTERED:none  DRAINS: (Medium) Hemovact drain(s) in the epidural space with  Suction Open   LOCAL MEDICATIONS USED:  MARCAINE     SPECIMEN:  No Specimen  DISPOSITION OF SPECIMEN:  N/A  COUNTS:  YES  TOURNIQUET:  * No tourniquets in log *  DICTATION: DICTATION: Patient is 80 year old man with cervical stenosis and cervical disc herniations and cervical myelopathy, cervical stenosis, and neck pain.  It was elected to take the patient to surgery for posterior cervical decompression and fusion.   PROCEDURE: Patient was brought to the OR and following smooth and uncomplicated induction of GETA using Glide Scope, patient was placed in 3 pin fixation and rolled into a prone position on the OR table in the cervical collar.  Shoulders were taped to facilitate Xray visualization. Posterior neck was shaved with clippers, prepped and draped in the usual fashion with betadine scrub followed by Duraprep.  Area of planned incision was infiltrated with local lidocaine.  Incision was made from C2-C7 and carried through the avascular  midline plane to expose these lavels and their lateral masses.  There was significant arthritis at each of these levels with facet arthropathy and bony excrescences.  Lateral mass screws were placed from C3 to C7 bilaterally according to standard landmarks and their positioning was confirmed with fluoroscopy.  The facet joints and laminae were decorticated.  Screws and rods were locked down in situ.  The spinous processes of C3-C7 were then removed and the laminae were thinned with a high speed drill. A total laminectomy was performed with undercutting of the C2 lamina all the way to the top of C7 and the foraminae were also decompressed at each level. The spinal cord dura appeared to be pulsatile and well decompressed.  Autograft and allograft was packed in the posterolateral region from C 3 through C 7 levels.  Hemostasis was assured and a medium Hemovac drain was placed in the epidural space. Wound was closed with 0, 2-0, and 3-0 vicryl sutures and staples were used to re approximate the skin edges. Sterile occlusive dressing was placed.  Patient was taken out of pins and turned back onto the OR table.  Patient was extubated in the operating room and taken to recovery in stable and satisfactory condition.  Counts were correct at the end of the case.  PLAN OF CARE: Admit to inpatient   PATIENT DISPOSITION:  PACU - hemodynamically stable.   Delay start of Pharmacological VTE agent (>24hrs) due to surgical blood loss or risk of bleeding: yes

## 2014-06-24 NOTE — Op Note (Signed)
06/24/2014  1:16 PM  PATIENT:  Aaron Schmidt  80 y.o. male  PRE-OPERATIVE DIAGNOSIS:  Cervical spondylosis with myelopathy, Cervical stenosis, herniated cervical disc, cervical radiculopathy C 34, C 45, C 56, C 67 levels  POST-OPERATIVE DIAGNOSIS:   Cervical spondylosis with myelopathy, Cervical stenosis, herniated cervical disc, cervical radiculopathy C 34, C 45, C 56, C 67 levels   PROCEDURE:  Procedure(s) with comments: Posterior Cervical Three-Seven Fusion with Lateral Mass Fixation (N/A) - C3-C7 posterior cervical fusion with lateral mass fixation C 2 - C 7 decompression with posterolateral arthrodesis C 3 - C 7 levels  SURGEON:  Surgeon(s) and Role:    * Erline Levine, MD - Primary    * Ashok Pall, MD - Assisting  PHYSICIAN ASSISTANT:   ASSISTANTS: Poteat, RN   ANESTHESIA:   general  EBL:  Total I/O In: 1500 [I.V.:1500] Out: 150 [Blood:150]  BLOOD ADMINISTERED:none  DRAINS: (Medium) Hemovact drain(s) in the epidural space with  Suction Open   LOCAL MEDICATIONS USED:  MARCAINE     SPECIMEN:  No Specimen  DISPOSITION OF SPECIMEN:  N/A  COUNTS:  YES  TOURNIQUET:  * No tourniquets in log *  DICTATION: DICTATION: Patient is 80 year old man with cervical stenosis and cervical disc herniations and cervical myelopathy, cervical stenosis, and neck pain.  It was elected to take the patient to surgery for posterior cervical decompression and fusion.   PROCEDURE: Patient was brought to the OR and following smooth and uncomplicated induction of GETA using Glide Scope, patient was placed in 3 pin fixation and rolled into a prone position on the OR table in the cervical collar.  Shoulders were taped to facilitate Xray visualization. Posterior neck was shaved with clippers, prepped and draped in the usual fashion with betadine scrub followed by Duraprep.  Area of planned incision was infiltrated with local lidocaine.  Incision was made from C2-C7 and carried through the avascular  midline plane to expose these lavels and their lateral masses.  There was significant arthritis at each of these levels with facet arthropathy and bony excrescences.  Lateral mass screws were placed from C3 to C7 bilaterally according to standard landmarks and their positioning was confirmed with fluoroscopy.  The facet joints and laminae were decorticated.  Screws and rods were locked down in situ.  The spinous processes of C3-C7 were then removed and the laminae were thinned with a high speed drill. A total laminectomy was performed with undercutting of the C2 lamina all the way to the top of C7 and the foraminae were also decompressed at each level. The spinal cord dura appeared to be pulsatile and well decompressed.  Autograft and allograft was packed in the posterolateral region from C 3 through C 7 levels.  Hemostasis was assured and a medium Hemovac drain was placed in the epidural space. Wound was closed with 0, 2-0, and 3-0 vicryl sutures and staples were used to re approximate the skin edges. Sterile occlusive dressing was placed.  Patient was taken out of pins and turned back onto the OR table.  Patient was extubated in the operating room and taken to recovery in stable and satisfactory condition.  Counts were correct at the end of the case.  PLAN OF CARE: Admit to inpatient   PATIENT DISPOSITION:  PACU - hemodynamically stable.   Delay start of Pharmacological VTE agent (>24hrs) due to surgical blood loss or risk of bleeding: yes

## 2014-06-25 MED ORDER — HYDROMORPHONE HCL 2 MG PO TABS
2.0000 mg | ORAL_TABLET | ORAL | Status: DC | PRN
  Administered 2014-06-25: 4 mg via ORAL
  Filled 2014-06-25: qty 2

## 2014-06-25 NOTE — Progress Notes (Signed)
Physical Therapy Treatment Patient Details Name: Aaron Schmidt MRN: [id number] DOB: 1933-07-01 Today's Date: Jul 09, 2014    History of Present Illness pt admitted for multi-level posterior cervical fusion C3-7    PT Comments    Pt able to recall need to limit neck rotation and educated for no over head activity as well as lifting restrictions. Pt educated for precautions with all mobility and would benefit from soft collar at night for sleeping comfort if MD will allow. Pt moving well, completed gait and stairs and will benefit from one more session of P.T. To maximize knowledge of transfers and precautions for independence  Follow Up Recommendations  No PT follow up     Equipment Recommendations  None recommended by PT    Recommendations for Other Services       Precautions / Restrictions Precautions Precautions: Cervical Required Braces or Orthoses: Cervical Brace Cervical Brace: Hard collar;At all times    Mobility  Bed Mobility Overal bed mobility: Needs Assistance Bed Mobility: Rolling;Sidelying to Sit Rolling: Supervision Sidelying to sit: Supervision       General bed mobility comments: cues for best technique  Transfers Overall transfer level: Modified independent                  Ambulation/Gait Ambulation/Gait assistance: Modified independent (Device/Increase time) Ambulation Distance (Feet): 350 Feet Assistive device: None Gait Pattern/deviations: WFL(Within Functional Limits)         Stairs Stairs: Yes Stairs assistance: Modified independent (Device/Increase time) Stair Management: One rail Right;Alternating pattern;Forwards Number of Stairs: 18    Wheelchair Mobility    Modified Rankin (Stroke Patients Only)       Balance                                    Cognition Arousal/Alertness: Awake/alert Behavior During Therapy: WFL for tasks assessed/performed Overall Cognitive Status: Within Functional Limits for  tasks assessed                      Exercises      General Comments        Pertinent Vitals/Pain Pain Assessment: 0-10 Pain Score: 4  Pain Location: sides of neck where brace pushing Pain Descriptors / Indicators: Aching Pain Intervention(s): Repositioned    Home Living                      Prior Function            PT Goals (current goals can now be found in the care plan section) Progress towards PT goals: Progressing toward goals    Frequency  Min 5X/week    PT Plan Current plan remains appropriate    Co-evaluation             End of Session Equipment Utilized During Treatment: Cervical collar Activity Tolerance: Patient tolerated treatment well Patient left: in chair;with call bell/phone within reach;with family/visitor present     Time: 9798-9211 PT Time Calculation (min): 23 min  Charges:  $Gait Training: 8-22 mins $Therapeutic Activity: 8-22 mins                    G Codes:      Melford Aase 07-09-2014, 9:16 AM Elwyn Reach, [place]

## 2014-06-25 NOTE — Progress Notes (Signed)
Subjective: Patient reports sore, but doing well.  Objective: Vital signs in last 24 hours: Temp:  [97.8 F (36.6 C)-98.7 F (37.1 C)] 98.2 F (36.8 C) (10/02 1218) Pulse Rate:  [80-98] 80 (10/02 1218) Resp:  [11-18] 16 (10/02 1218) BP: (122-145)/(57-81) 127/67 mmHg (10/02 1218) SpO2:  [93 %-98 %] 98 % (10/02 1218)  Intake/Output from previous day: 10/01 0701 - 10/02 0700 In: 2000 [I.V.:2000] Out: [place] [Urine:1175; Drains:210; Blood:150] Intake/Output this shift: Total I/O In: 480 [P.O.:480] Out: -   Physical Exam: Full strength both upper extremities.  Dressing CDI.  Lab Results: No results found for this basename: WBC, HGB, HCT, PLT,  in the last 72 hours BMET No results found for this basename: NA, K, CL, CO2, GLUCOSE, BUN, CREATININE, CALCIUM,  in the last 72 hours  Studies/Results: Dg Cervical Spine 2-3 Views  06/24/2014   CLINICAL DATA:  Posterior cervical fusion C3-C7.  EXAM: CERVICAL SPINE - 2-3 VIEW; DG C-ARM 61-120 MIN  COMPARISON:  03/03/2014  FINDINGS: Intraoperative lateral spot fluoroscopic images over the cervical spine with moderate spondylosis. There is disc space narrowing at the C4-5 level. There is poor visualization inferior to the C4-5 level. Posterior fusion hardware is present from C3-C7.  IMPRESSION: Posterior fusion hardware from C3-C7. Recommend correlation with findings at the time of the procedure.   Electronically Signed   By: Marin Olp M.D.   On: 06/24/2014 13:46   Dg C-arm 1-60 Min  06/24/2014   CLINICAL DATA:  Posterior cervical fusion C3-C7.  EXAM: CERVICAL SPINE - 2-3 VIEW; DG C-ARM 61-120 MIN  COMPARISON:  03/03/2014  FINDINGS: Intraoperative lateral spot fluoroscopic images over the cervical spine with moderate spondylosis. There is disc space narrowing at the C4-5 level. There is poor visualization inferior to the C4-5 level. Posterior fusion hardware is present from C3-C7.  IMPRESSION: Posterior fusion hardware from C3-C7. Recommend  correlation with findings at the time of the procedure.   Electronically Signed   By: Marin Olp M.D.   On: 06/24/2014 13:46    Assessment/Plan: Mobilizing well.  Observe today, may go home later today or in AM.    LOS: 1 day    STERN,JOSEPH D, MD 06/25/2014, 1:07 PM

## 2014-06-25 NOTE — Progress Notes (Signed)
Pt doing well. Pt and wife given D/C instructions with verbal understanding. Pt's IV and hemovac were D/C'd prior to discharge. Pt's incision is clean and dry with no sign of infection. Pt D/C'd home via wheelchair @ 1440 per MD order. Pt is stable @ D/C and has no other needs at this time. Holli Humbles, RN

## 2014-06-25 NOTE — Progress Notes (Signed)
Occupational Therapy Evaluation and Discharge Patient Details Name: Aaron Schmidt MRN: [id number] DOB: 1932/10/16 Today's Date: 06/25/2014    History of Present Illness pt admitted for multi-level posterior cervical fusion C3-7   Clinical Impression   PTA pt lived at home with his wife and was independent with ADLs. Pt and wife educated on fall prevention and incorporating cervical precautions into ADLs. Wife will assist with ADLs. No further acute OT needs.     Follow Up Recommendations  No OT follow up;Supervision/Assistance - 24 hour    Equipment Recommendations  None recommended by OT    Recommendations for Other Services       Precautions / Restrictions Precautions Precautions: Cervical Required Braces or Orthoses: Cervical Brace Cervical Brace: Hard collar;At all times Restrictions Weight Bearing Restrictions: No      Mobility Bed Mobility               General bed mobility comments: Pt sitting in recliner when OT arrived.   Transfers Overall transfer level: Modified independent                         ADL Overall ADL's : Needs assistance/impaired Eating/Feeding: Independent;Sitting   Grooming: Min guard;Standing   Upper Body Bathing: Sitting;Supervision/ safety   Lower Body Bathing: Moderate assistance;Sit to/from stand Lower Body Bathing Details (indicate cue type and reason): pt reports he cannot reach Bil feet and typically goes to have his nails trimmed.  Upper Body Dressing : Minimal assistance;Sitting Upper Body Dressing Details (indicate cue type and reason): pt's wife assisted Lower Body Dressing: Maximal assistance;Sit to/from stand Lower Body Dressing Details (indicate cue type and reason): pt's wife assists with socks and shoes Toilet Transfer: Min guard;Ambulation             General ADL Comments: OT arrived as pt was dressing with wife assist. Pt reports that his wife assists with LB dressing as he cannot reach Bil LEs.  Educated pt/wife on fall prevention and energy conservation.      Vision  No apparent deficits.                    Perception Perception Perception Tested?: No   Praxis Praxis Praxis tested?: Not tested    Pertinent Vitals/Pain Pain Assessment: No/denies pain     Hand Dominance Right   Extremity/Trunk Assessment Upper Extremity Assessment Upper Extremity Assessment: Overall WFL for tasks assessed   Lower Extremity Assessment Lower Extremity Assessment: Overall WFL for tasks assessed   Cervical / Trunk Assessment Cervical / Trunk Assessment: Normal;Other exceptions Cervical / Trunk Exceptions: posterior cervical   Communication Communication Communication: No difficulties   Cognition Arousal/Alertness: Awake/alert Behavior During Therapy: WFL for tasks assessed/performed Overall Cognitive Status: Within Functional Limits for tasks assessed                                Home Living Family/patient expects to be discharged to:: Private residence Living Arrangements: Spouse/significant other Available Help at Discharge: Family;Available 24 hours/day Type of Home: House Home Access: Stairs to enter   Entrance Stairs-Rails: Right;Left Home Layout: Multi-level Alternate Level Stairs-Number of Steps: flights Alternate Level Stairs-Rails: Right;Left Bathroom Shower/Tub: Occupational psychologist: Standard     Home Equipment: Crutches;Cane - single point;Walker - 2 wheels          Prior Functioning/Environment Level of Independence: Independent  Comments: Pt reports that he should use a cane but does not.                               End of Session Nurse Communication: Other (comment) (pt ready for d/c from OT standpoint)  Activity Tolerance: Patient tolerated treatment well Patient left: in chair;with call bell/phone within reach;with family/visitor present   Time: 6967-8938 OT Time Calculation (min): 12  min Charges:  OT General Charges $OT Visit: 1 Procedure OT Evaluation $Initial OT Evaluation Tier I: 1 Procedure OT Treatments $Self Care/Home Management : 8-22 mins  Villa Herb M 06/25/2014, 2:41 PM  Cyndie Chime, OTR/L Occupational Therapist [phone] (pager)

## 2014-06-28 ENCOUNTER — Encounter (HOSPITAL_COMMUNITY): Payer: Self-pay | Admitting: Neurosurgery

## 2014-07-01 NOTE — Discharge Summary (Signed)
Physician Discharge Summary  Patient ID: Aaron Schmidt MRN: [id number] DOB/AGE: 1933/07/24 80 y.o.  Admit date: 06/24/2014 Discharge date: 07/01/2014  Admission Diagnoses:Cervical disc herniations with stenosis with myelopathy C 3- 7 levels  Discharge Diagnoses: Cervical disc herniations with stenosis with myelopathy C 3- 7 levels Active Problems:   Cervical myelopathy with cervical radiculopathy   Discharged Condition: good  Hospital Course: Decompression and posterior cervical fusion C 3 - C 7 levels with uncomplicated postoperative course  Consults: None  Significant Diagnostic Studies: None  Treatments: surgery: Decompression and posterior cervical fusion C 3 - C 7 levels  Discharge Exam: Blood pressure 127/67, pulse 80, temperature 98.2 F (36.8 C), temperature source Oral, resp. rate 16, weight 92.987 kg (205 lb), SpO2 98.00%. Neurologic: Alert and oriented X 3, normal strength and tone. Normal symmetric reflexes. Normal coordination and gait Wound:CDI  Disposition: Home     Medication List    ASK your doctor about these medications       aspirin EC 81 MG tablet  Take 81 mg by mouth daily.     b complex vitamins tablet  Take 1 tablet by mouth daily.     clobetasol 0.05 % external solution  Commonly known as:  TEMOVATE  Apply 1 application topically 2 (two) times daily.     doxazosin 4 MG tablet  Commonly known as:  CARDURA  Take 4 mg by mouth at bedtime.     Flax Seed Oil 1000 MG Caps  Take 1 capsule by mouth daily.     HYDROmorphone 2 MG tablet  Commonly known as:  DILAUDID  Take 2 mg by mouth daily as needed for severe pain.     L-Lysine 500 MG Caps  Take 1 capsule by mouth daily.     multivitamin capsule  Take 1 capsule by mouth daily.     nortriptyline 25 MG capsule  Commonly known as:  PAMELOR  Take 50 mg by mouth at bedtime.     PERDIEM PO  Take 3 tablets by mouth 2 (two) times daily.     pregabalin 150 MG capsule  Commonly known as:   LYRICA  Take 150 mg by mouth 2 (two) times daily.     pyridOXINE 100 MG tablet  Commonly known as:  VITAMIN B-6  Take 100 mg by mouth daily.     saw palmetto 80 MG capsule  Take 240 mg by mouth daily.     simvastatin 40 MG tablet  Commonly known as:  ZOCOR  Take 40 mg by mouth at bedtime.     STOOL SOFTENER PO  Take 3-4 tablets by mouth 2 (two) times daily. Takes 3 tablets every morning and 4 tablets every evening.     tiZANidine 2 MG tablet  Commonly known as:  ZANAFLEX  Take 2 mg by mouth 3 (three) times daily as needed for muscle spasms.     triamterene-hydrochlorothiazide 37.5-25 MG per tablet  Commonly known as:  MAXZIDE-25  Take 1 tablet by mouth daily.     Vitamin D 2000 UNITS Caps  Take 1 capsule by mouth daily.         Signed: Peggyann Shoals, MD 07/01/2014, 8:24 AM

## 2014-07-19 DIAGNOSIS — M5020 Other cervical disc displacement, unspecified cervical region: Secondary | ICD-10-CM | POA: Insufficient documentation

## 2014-07-19 HISTORY — DX: Other cervical disc displacement, unspecified cervical region: M50.20

## 2016-08-07 ENCOUNTER — Ambulatory Visit (INDEPENDENT_AMBULATORY_CARE_PROVIDER_SITE_OTHER): Payer: Medicare Other | Admitting: Podiatry

## 2016-08-07 ENCOUNTER — Encounter: Payer: Self-pay | Admitting: Podiatry

## 2016-08-07 ENCOUNTER — Ambulatory Visit (HOSPITAL_BASED_OUTPATIENT_CLINIC_OR_DEPARTMENT_OTHER)
Admission: RE | Admit: 2016-08-07 | Discharge: 2016-08-07 | Disposition: A | Discharge status: Home / Self Care | Payer: Medicare Other | Source: Ambulatory Visit | Attending: Podiatry | Admitting: Podiatry

## 2016-08-07 DIAGNOSIS — B351 Tinea unguium: Secondary | ICD-10-CM | POA: Diagnosis not present

## 2016-08-07 DIAGNOSIS — L603 Nail dystrophy: Secondary | ICD-10-CM

## 2016-08-07 DIAGNOSIS — R52 Pain, unspecified: Secondary | ICD-10-CM | POA: Diagnosis not present

## 2016-08-07 MED ORDER — CEPHALEXIN 500 MG PO CAPS: 500.0000 mg | ORAL_CAPSULE | Freq: Three times a day (TID) | ORAL | 2 refills | Status: DC

## 2016-08-07 NOTE — Progress Notes (Signed)
   Subjective:    Patient ID: Aaron Schmidt, male    DOB: 18-Aug-1933, 82 y.o.   MRN: [id number]  HPI  82 year old male presents the office if the left toe pain as well as toenail pain on the left side. He had a pedicure about 2-3 weeks ago and since then the entire left toenails been painful. He states it is painful with pressure in shoes. He had to cut out issues there is no pressure on the toenail. Denies any drainage or pus. No redness. Denies any recent injury or trauma. No other complaints.  Review of Systems  All other systems reviewed and are negative.      Objective:   Physical Exam General: AAO x3, NAD  Dermatological: Left hallux toenail is hypertrophic, dystrophic, discolored and there is tenderness the entire toenail. There is incurvation of both medial and lateral nail borders. There is no edema, erythema, drainage or any signs of infection otherwise. No other open lesions or pre-ulcer lesions.  Vascular: Dorsalis Pedis artery and Posterior Tibial artery pedal pulses are 2/4 bilateral with immedate capillary fill time. There is no pain with calf compression, swelling, warmth, erythema.   Neruologic: Grossly intact via light touch bilateral. Vibratory intact via tuning fork bilateral. Protective threshold with Semmes Wienstein monofilament intact to all pedal sites bilateral.   Musculoskeletal: No gross boney pedal deformities bilateral. No pain, crepitus, or limitation noted with foot and ankle range of motion bilateral. Muscular strength 5/5 in all groups tested bilateral.  Gait: Unassisted, Nonantalgic.     Assessment & Plan:  82 year old male with left hallux onychodystrophy, pain -Treatment options discussed including all alternatives, risks, and complications -Etiology of symptoms were discussed -At this time, recommended total nail removal without chemical matricectomy to the left hallux toenail. He declined me to trim the nail as he did not think this would help.  Risks and complications were discussed with the patient for which they understand and  verbally consent to the procedure. Under sterile conditions a total of 3 mL of a mixture of 2% lidocaine plain and 0.5% Marcaine plain was infiltrated in a hallux block fashion. Once anesthetized, the skin was prepped in sterile fashion. A tourniquet was then applied. Next the left hallux toenail was excised making sure to remove the entire offending nail border. Once the nail was removed, the area was debrided and the underlying skin was intact. The area was irrigated and hemostasis was obtained.  A dry sterile dressing was applied. After application of the dressing the tourniquet was removed and there is found to be an immediate capillary refill time to the digit. The patient tolerated the procedure well any complications. Post procedure instructions were discussed the patient for which he verbally understood. Follow-up in one week for nail check or sooner if any problems are to arise. Discussed signs/symptoms of worsening infection and directed to call the office immediately should any occur or go directly to the emergency room. In the meantime, encouraged to call the office with any questions, concerns, changes symptoms. -x-ray negative. -Keflex

## 2016-08-07 NOTE — Patient Instructions (Signed)

## 2016-08-10 ENCOUNTER — Telehealth: Payer: Self-pay | Admitting: *Deleted

## 2016-08-10 NOTE — Telephone Encounter (Signed)
Pt asked how long does he need to soak his foot after the toenail procedure. I informed pt he would need to soak 4-6 weeks for 20 minutes each episode, then cover with a lightly coated antibiotic ointment bandaid and when the area developed a hard dry scab, then he could go to once a day soaks and the antibiotic ointment dressings when he is in shoes and allow the area to air dry when not in a shoe or walking around. Pt states understanding.

## 2016-08-14 ENCOUNTER — Ambulatory Visit (INDEPENDENT_AMBULATORY_CARE_PROVIDER_SITE_OTHER): Payer: Medicare Other | Admitting: Podiatry

## 2016-08-14 ENCOUNTER — Encounter: Payer: Self-pay | Admitting: Podiatry

## 2016-08-14 DIAGNOSIS — L603 Nail dystrophy: Secondary | ICD-10-CM

## 2016-08-14 DIAGNOSIS — Z9889 Other specified postprocedural states: Secondary | ICD-10-CM

## 2016-08-14 NOTE — Patient Instructions (Signed)

## 2016-08-14 NOTE — Progress Notes (Signed)
Subjective: Aaron Schmidt is a 82 y.o.  male returns to office today for follow up evaluation after having left Hallux totalnail avulsion performed. Patient has been soaking using epsom satls and applying topical antibiotic covered with bandaid daily. He's been is still somewhat tender. Denies any drainage or redness around the site. Patient denies fevers, chills, nausea, vomiting. Denies any calf pain, chest pain, SOB.   Objective:  Vitals: Reviewed  General: Well developed, nourished, in no acute distress, alert and oriented x3   Dermatology: Skin is warm, dry and supple bilateral. Left hallux nail bed appears to be clean, dry, with mild granular tissue and surrounding scab. There is no surrounding erythema, edema, drainage/purulence. The remaining nails appear unremarkable at this time. There are no other lesions or other signs of infection present.  Neurovascular status: Intact. No lower extremity swelling; No pain with calf compression bilateral.  Musculoskeletal: Mild tenderness to palpation of the left hallux nail bed. Muscular strength within normal limits bilateral.   Assesement and Plan: S/p partial nail avulsion, doing well.   -Continue soaking in epsom salts twice a day followed by antibiotic ointment and a band-aid. Can leave uncovered at night. Continue this until completely healed.  -If the area has not healed in 2 weeks, call the office for follow-up appointment, or sooner if any problems arise.  -Monitor for any signs/symptoms of infection. Call the office immediately if any occur or go directly to the emergency room. Call with any questions/concerns.  Celesta Gentile, DPM

## 2016-08-21 ENCOUNTER — Ambulatory Visit: Payer: Medicare Other | Admitting: Podiatry

## 2016-08-21 ENCOUNTER — Ambulatory Visit (INDEPENDENT_AMBULATORY_CARE_PROVIDER_SITE_OTHER): Payer: Medicare Other | Admitting: Podiatry

## 2016-08-21 ENCOUNTER — Encounter: Payer: Self-pay | Admitting: Podiatry

## 2016-08-21 DIAGNOSIS — L603 Nail dystrophy: Secondary | ICD-10-CM

## 2016-08-21 DIAGNOSIS — L97521 Non-pressure chronic ulcer of other part of left foot limited to breakdown of skin: Secondary | ICD-10-CM | POA: Diagnosis not present

## 2016-08-21 MED ORDER — CEPHALEXIN 500 MG PO CAPS: 500.0000 mg | ORAL_CAPSULE | Freq: Three times a day (TID) | ORAL | 0 refills | Status: DC

## 2016-08-21 NOTE — Progress Notes (Signed)
Subjective: Aaron Schmidt is a 83 y.o.  male returns to office today for follow up evaluation after having left Hallux totalnail avulsion performed. He feels the wound is not healing like it should. He has not noticed any drainage or pus and there is no surrounding redness or red streaking. He has been soaking in Epson salt soaks cover with antibiotic ointment and a bandage.  Patient denies fevers, chills, nausea, vomiting. Denies any calf pain, chest pain, SOB.   Objective:  Vitals: Reviewed  General: Well developed, nourished, in no acute distress, alert and oriented x3   Dermatology: Skin is warm, dry and supple bilateral. Left hallux nail bed appears to be clean, dry, with mild granular tissue and mild scab present. There is no surrounding erythema, edema, drainage/purulence. The remaining nails appear unremarkable at this time. There are no other lesions or other signs of infection present.  Neurovascular status: Intact. No lower extremity swelling; No pain with calf compression bilateral.  Musculoskeletal: Mild tenderness to palpation of the left hallux nail bed. Muscular strength within normal limits bilateral.   Assesement and Plan: S/p partial nail avulsion, doing well.   -Continue soaking in epsom salts twice a day followed bya band-aid. Can leave uncovered at night. Continue this until completely healed.  -No signs of infection today but will start keflex in case of early infection -Follow-up in 2 weeks, call the office for follow-up appointment, or sooner if any problems arise.  -Monitor for any signs/symptoms of infection. Call the office immediately if any occur or go directly to the emergency room. Call with any questions/concerns.  Celesta Gentile, DPM

## 2016-08-21 NOTE — Patient Instructions (Signed)

## 2016-08-23 ENCOUNTER — Telehealth: Payer: Self-pay

## 2016-08-23 NOTE — Telephone Encounter (Signed)
I sent Keflex to his pharmacy on Tuesday. Did he start this?

## 2016-08-23 NOTE — Telephone Encounter (Signed)
Pt called stating that there has been an increase and change in the drainage and redness coming from his toe, he was advised to call with any s/s of infection.

## 2016-08-23 NOTE — Telephone Encounter (Signed)
Spoke with patient regarding post nail avulsion care, he stated that he saw some drainage from the toe. I advised him that the Epsom salt soaks were pulling some of the fluid and swelling from the toe infection and that drainage was normal, but should start to ease off over the next few days.   Advised to continue with antibiotics and soaks and keep his follow up appt

## 2016-08-28 ENCOUNTER — Telehealth: Payer: Self-pay | Admitting: *Deleted

## 2016-08-28 NOTE — Telephone Encounter (Addendum)
-----   Message from Trula Slade, DPM sent at 08/27/2016  5:04 PM EST ----- Negative for fungus- please let him know. 08/28/2016-Left message with pt's wife, Regino Schultze for pt to call for lab results. Pt left message stating wife told him to call for lab results, but he is not sure why. Informed pt of Dr. Leigh Aurora review of results, and told him often negative results to a ugly nail is micro-trauma like too tight shoes or history of trauma and sometimes the nailbed just decided it doesn't want to grow proper anymore. Pt states the area is now beginning to get drier. I told pt it could take up to 4-6 weeks for the toenail area to become a dry hard scab without redness, drainage and swelling, to continue soaks until then. Pt states understanding.

## 2016-09-04 ENCOUNTER — Ambulatory Visit (INDEPENDENT_AMBULATORY_CARE_PROVIDER_SITE_OTHER): Payer: Medicare Other | Admitting: Podiatry

## 2016-09-04 ENCOUNTER — Encounter: Payer: Self-pay | Admitting: Podiatry

## 2016-09-04 DIAGNOSIS — L97521 Non-pressure chronic ulcer of other part of left foot limited to breakdown of skin: Secondary | ICD-10-CM | POA: Insufficient documentation

## 2016-09-04 HISTORY — DX: Non-pressure chronic ulcer of other part of left foot limited to breakdown of skin: L97.521

## 2016-09-04 NOTE — Progress Notes (Signed)
Subjective: Aaron Schmidt is a 82 y.o.  male returns to office today for follow up evaluation after having left Hallux totalnail avulsion performed. He states the wound is slowly healing. He denies any drainage or pus. He has been continue with antibiotic ointment to the wound daily during the day but keep the area uncovered at the house to help dry. He has noticed that there has been some drying of the wound. Denies any swelling redness or red streaks. Denies any pain to the area.  Patient denies fevers, chills, nausea, vomiting. Denies any calf pain, chest pain, SOB.   Objective:  Vitals: Reviewed  General: Well developed, nourished, in no acute distress, alert and oriented x3   Dermatology: Skin is warm, dry and supple bilateral. Left hallux nail bed appears to be clean, dry, with granular tissue along the central aspect and mild scab present. Overall, the wound appears to be improved. There is no surrounding erythema, edema, drainage/purulence. The remaining nails appear unremarkable at this time. There are no other lesions or other signs of infection present.  Neurovascular status: Intact. No lower extremity swelling; No pain with calf compression bilateral.  Musculoskeletal: Mild tenderness to palpation of the left hallux nail bed. Muscular strength within normal limits bilateral.   Assesement and Plan: S/p partial nail avulsion, with slowly healing wound   -Continue soaking in epsom salts twice a day followed bya band-aid. Can leave uncovered at night. Continue this until completely healed.  -No clinical signs of infection at this time. Continue to monitor. He completed his course of antibiotics and will hold further antibiotics at this time.  -Follow-up in 3 weeks, call the office for follow-up appointment, or sooner if any problems arise.  -Monitor for any signs/symptoms of infection. Call the office immediately if any occur or go directly to the emergency room. Call with any  questions/concerns.  Celesta Gentile, DPM

## 2016-09-25 ENCOUNTER — Ambulatory Visit (INDEPENDENT_AMBULATORY_CARE_PROVIDER_SITE_OTHER): Payer: Medicare Other | Admitting: Podiatry

## 2016-09-25 ENCOUNTER — Encounter: Payer: Self-pay | Admitting: Podiatry

## 2016-09-25 DIAGNOSIS — L97521 Non-pressure chronic ulcer of other part of left foot limited to breakdown of skin: Secondary | ICD-10-CM

## 2016-09-25 NOTE — Progress Notes (Signed)
Subjective: Aaron Schmidt is a 83 y.o.  male returns to office today for follow up evaluation after having left Hallux totalnail avulsion performed. He has continued to soak his left foot twice a day in epsom salts. He still has a small amount of drainage but it is improving. Denies any pus. Pain is improving.  Denies any pain to the area.  Patient denies fevers, chills, nausea, vomiting. Denies any calf pain, chest pain, SOB.   Objective:  Vitals: Reviewed  General: Well developed, nourished, in no acute distress, alert and oriented x3   Dermatology: Skin is warm, dry and supple bilateral. Left hallux nail bed appears to be clean, dry, with granular tissue along the central aspect and mild scab present.  Overall, the wound is superficial and appears to be an abrasion type wound. The diameter also appears to be improved. There is no surrounding erythema or ascending cellulitis. There is no fluctuance or crepitance. No malodor. No other open lesions are identified.   Neurovascular status: Intact. No lower extremity swelling; No pain with calf compression bilateral.  Musculoskeletal: Mild tenderness to palpation of the left hallux nail bed. Muscular strength within normal limits bilateral.   Assesement and Plan: S/p partial nail avulsion, with slowly healing wound without signs of infection.   -Continue soaking in epsom salts once a day followed bya band-aid. Can leave uncovered at night. Continue this until completely healed.  -No clinical signs of infection at this time. Continue to monitor. He completed his course of antibiotics and will hold further antibiotics at this time.  -Follow-up in 3 weeks, call the office for follow-up appointment, or sooner if any problems arise.  -Monitor for any signs/symptoms of infection. Call the office immediately if any occur or go directly to the emergency room. Call with any questions/concerns.  Celesta Gentile, DPM

## 2016-10-16 ENCOUNTER — Ambulatory Visit (INDEPENDENT_AMBULATORY_CARE_PROVIDER_SITE_OTHER): Payer: Medicare Other | Admitting: Podiatry

## 2016-10-16 DIAGNOSIS — L97521 Non-pressure chronic ulcer of other part of left foot limited to breakdown of skin: Secondary | ICD-10-CM

## 2016-10-16 NOTE — Progress Notes (Signed)
Subjective: Aaron Schmidt is a 83 y.o.  male returns to office today for follow up evaluation after having left hallux slow healing ulcer after having toenail removed. He states it is doing better. He has been soaking in epsom salts up until yesterday as he believes the wound has healed. Denies any drainage, swelling, pus.  Patient denies fevers, chills, nausea, vomiting. Denies any calf pain, chest pain, SOB.   Objective:  Vitals: Reviewed  General: Well developed, nourished, in no acute distress, alert and oriented x3   Dermatology: Skin is warm, dry and supple bilateral.left hallux nail that appears to be healed and new nail started common. There is no granular tissue there is no open sores identified today. There is significantly improved tenderness to palpation to the area. There is no edema. There is no fluctuance or crepitus there is no malodor.  Neurovascular status: Intact. No lower extremity swelling; No pain with calf compression bilateral.  Musculoskeletal: Mild tenderness to palpation of the left hallux nail bed. Muscular strength within normal limits bilateral.   Assesement and Plan: S/p partial nail avulsion, with slowly healing wound without signs of infection.  -at this time appears that the wound is healed. He can discontinue soaking in Epson salts. Recommended dry Band-Aid for protection. I also dispensed offloading pads for the big toe. -The nail does appear to be somewhat thick as is coming and however we will once the nail grow back in to see what it looks like. Discussed possible urea cream to help fill the toenail if needed -At this time the wound has healed and no signs of infection. Follow-up as needed.Call any questions or concerns in the meantime.  Celesta Gentile, DPM

## 2016-12-24 ENCOUNTER — Encounter: Payer: Self-pay | Admitting: Physician Assistant

## 2017-01-08 ENCOUNTER — Encounter (INDEPENDENT_AMBULATORY_CARE_PROVIDER_SITE_OTHER): Payer: Self-pay

## 2017-01-08 ENCOUNTER — Ambulatory Visit (INDEPENDENT_AMBULATORY_CARE_PROVIDER_SITE_OTHER): Payer: Medicare Other | Admitting: Physician Assistant

## 2017-01-08 ENCOUNTER — Encounter: Payer: Self-pay | Admitting: Physician Assistant

## 2017-01-08 VITALS — BP 120/70 | HR 79 | Ht 70.0 in | Wt 201.8 lb

## 2017-01-08 DIAGNOSIS — I10 Essential (primary) hypertension: Secondary | ICD-10-CM

## 2017-01-08 DIAGNOSIS — R011 Cardiac murmur, unspecified: Secondary | ICD-10-CM

## 2017-01-08 DIAGNOSIS — E785 Hyperlipidemia, unspecified: Secondary | ICD-10-CM | POA: Diagnosis not present

## 2017-01-08 HISTORY — DX: Cardiac murmur, unspecified: R01.1

## 2017-01-08 NOTE — Patient Instructions (Signed)
Medication Instructions:  None Ordered   Labwork: None Ordered   Testing/Procedures: Your physician has requested that you have an echocardiogram. Echocardiography is a painless test that uses sound waves to create images of your heart. It provides your doctor with information about the size and shape of your heart and how well your heart's chambers and valves are working. This procedure takes approximately one hour. There are no restrictions for this procedure.    Follow-Up: Your physician wants you to follow-up in: 1 year with Dr. [name]. You will receive a reminder letter in the mail two months in advance. If you don't receive a letter, please call our office to schedule the follow-up appointment.   Any Other Special Instructions Will Be Listed Below (If Applicable).     If you need a refill on your cardiac medications before your next appointment, please call your pharmacy.

## 2017-01-08 NOTE — Progress Notes (Signed)
Cardiology Office Note    Date:  01/08/2017   ID:  Aaron Schmidt, DOB [birth year], MRN [id number]  PCP:  Gennette Pac, MD  Cardiologist: Dr. [name]  Chief Complaint  Patient presents with  . Follow-up    Seen for Dr. [name]    History of Present Illness:  Aaron Schmidt is a 83 y.o. male with h.o hypertension, hyperlipidemia, obesity, OSA on CPAPWho saw Dr. [name] in 2015 for preoperative clearance because of an abnormal EKG. He has first-degree AV block and left bundle branch block on EKG unchanged since 2012. He had no symptoms of angina or heart failure. No further testing was indicated.  Patient comes in today for checkup because primary care, Dr. Hulan Fess referred him. He is not very active because of chronic back pain but can walk several city blocks before he has to stop because of his back. He denies any chest pain, palpitations, dyspnea, dyspnea on exertion, dizziness, or presyncope. He has mild ankle edema that goes away in the morning it worsens during the day. He takes triamterene HCTZ for that.     Past Medical History:  Diagnosis Date  . Arthritis    Degeneration spine & stenosis  . Hypertension   . Sleep apnea 2012   used CPAP 2 yrs. ago, feels he sleeps better w/o, no longer using     Past Surgical History:  Procedure Laterality Date  . ARM NEUROPLASTY     at 14 yrs. of age- fell off tractor- had fracture & repair *& later- 58's had  transplantation of a nerve at the elbow  . BACK SURGERY     x4 back surgery x2 fusion -  . CARPAL TUNNEL RELEASE Right   . POSTERIOR CERVICAL FUSION/FORAMINOTOMY N/A 06/24/2014   Procedure: Posterior Cervical Three-Seven Fusion with Lateral Mass Fixation;  Surgeon: Erline Levine, MD;  Location: [facility];  Service: Neurosurgery;  Laterality: N/A;  C3-C7 posterior cervical fusion with lateral mass fixation  . SHOULDER SURGERY Bilateral     Current Medications: Outpatient Medications Prior to Visit  Medication  Sig Dispense Refill  . aspirin EC 81 MG tablet Take 81 mg by mouth daily.    Marland Kitchen b complex vitamins tablet Take 1 tablet by mouth daily.      . Cholecalciferol (VITAMIN D) 2000 UNITS CAPS Take 1 capsule by mouth daily.      . clobetasol (TEMOVATE) 0.05 % external solution Apply 1 application topically 2 (two) times daily.    Mariane Baumgarten Calcium (STOOL SOFTENER PO) Take 3-4 tablets by mouth 2 (two) times daily. Takes 3 tablets every morning and 4 tablets every evening.    Marland Kitchen doxazosin (CARDURA) 4 MG tablet Take 4 mg by mouth at bedtime.      . finasteride (PROSCAR) 5 MG tablet Take 5 mg by mouth daily.    . Flaxseed, Linseed, (FLAX SEED OIL) 1000 MG CAPS Take 1 capsule by mouth daily.      . folic acid (FOLVITE) 1 MG tablet Take 1 mg by mouth daily.    Marland Kitchen HYDROmorphone (DILAUDID) 2 MG tablet Take 2 mg by mouth daily as needed for severe pain.     Marland Kitchen L-Lysine 500 MG CAPS Take 1 capsule by mouth daily.      . methotrexate (RHEUMATREX) 2.5 MG tablet Take 2.5 mg by mouth once a week. Caution:Chemotherapy. Protect from light.    . Multiple Vitamin (MULTIVITAMIN) capsule Take 1 capsule by mouth daily.      Marland Kitchen  nortriptyline (PAMELOR) 25 MG capsule Take 50 mg by mouth at bedtime.     . pregabalin (LYRICA) 150 MG capsule Take 150 mg by mouth 2 (two) times daily.    Marland Kitchen pyridOXINE (VITAMIN B-6) 100 MG tablet Take 100 mg by mouth daily.      . saw palmetto 80 MG capsule Take 240 mg by mouth daily.     . Senna-Psyllium (PERDIEM PO) Take 3 tablets by mouth 2 (two) times daily.     . simvastatin (ZOCOR) 40 MG tablet Take 40 mg by mouth at bedtime.      Marland Kitchen tiZANidine (ZANAFLEX) 2 MG tablet Take 2 mg by mouth 3 (three) times daily as needed for muscle spasms.    Marland Kitchen triamterene-hydrochlorothiazide (MAXZIDE-25) 37.5-25 MG per tablet Take 1 tablet by mouth daily.    . cephALEXin (KEFLEX) 500 MG capsule Take 1 capsule (500 mg total) by mouth 3 (three) times daily. 30 capsule 0   No facility-administered medications prior to  visit.      Allergies:   Diazepam   Social History   Social History  . Marital status: Married    Spouse name: N/A  . Number of children: N/A  . Years of education: N/A   Occupational History  . 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 Smoking status: Former Smoker    Years: 24.00    Types: Pipe    Quit date: 09/24/1978  . Smokeless tobacco: Never Used  . Alcohol use Yes     Comment: 3 drinks/day - gin or vodka   . Drug use: No  . Sexual activity: Not Asked   Other Topics Concern  . None   Social History Narrative  . None     Family History:  The patient Has no family history of CAD  ROS:   Please see the history of present illness.    Review of Systems  Constitution: Negative.  HENT: Negative.   Cardiovascular: Negative.   Respiratory: Negative.   Endocrine: Negative.   Hematologic/Lymphatic: Negative.   Musculoskeletal: Positive for back pain.  Gastrointestinal: Negative.   Genitourinary: Negative.   Neurological: Negative.    All other systems reviewed and are negative.   PHYSICAL EXAM:   VS:  BP 120/70   Pulse 79   Ht 5\' 10"  (1.778 m)   Wt 201 lb 12.8 oz (91.5 kg)   BMI 28.96 kg/m   Physical Exam  GEN: Well nourished, well developed, in no acute distress  Neck: no JVD, carotid bruits, or masses Cardiac:RRR; 2/6 diastolic murmur, 1/6 systolic murmur left sternal border Respiratory:  clear to auscultation bilaterally, normal work of breathing GI: soft, nontender, nondistended, + BS Ext: Trace of ankle edema without cyanosis, clubbing. Good distal pulses bilaterally Psych: euthymic mood, full affect  Wt Readings from Last 3 Encounters:  01/08/17 201 lb 12.8 oz (91.5 kg)  06/24/14 205 lb (93 kg)  06/21/14 205 lb (93 kg)      Studies/Labs Reviewed:   EKG:  EKG is  ordered today.  The ekg ordered today demonstrates Normal sinus rhythm with LVH  Recent Labs: No results found for requested labs within  last 8760 hours.   Lipid Panel No results found for: CHOL, TRIG, HDL, CHOLHDL, VLDL, LDLCALC, LDLDIRECT  Additional studies/ records that were reviewed today include:      ASSESSMENT:    1. Essential hypertension   2. Murmur  3. Hyperlipidemia, unspecified hyperlipidemia type      PLAN:  In order of problems listed above:   Heart murmur consistent with aortic insufficiency and aortic stenosis. Patient is asymptomatic. Will order 2-D echo to further assess. Depending on echo results follow-up with Dr. [name] in 1 year or sooner  Hypertension well controlled  Hyperlipidemia treated with Zocor   Medication Adjustments/Labs and Tests Ordered: Current medicines are reviewed at length with the patient today.  Concerns regarding medicines are outlined above.  Medication changes, Labs and Tests ordered today are listed in the Patient Instructions below. Patient Instructions  Medication Instructions:  None Ordered   Labwork: None Ordered   Testing/Procedures: Your physician has requested that you have an echocardiogram. Echocardiography is a painless test that uses sound waves to create images of your heart. It provides your doctor with information about the size and shape of your heart and how well your heart's chambers and valves are working. This procedure takes approximately one hour. There are no restrictions for this procedure.    Follow-Up: Your physician wants you to follow-up in: 1 year with Dr. [name]. You will receive a reminder letter in the mail two months in advance. If you don't receive a letter, please call our office to schedule the follow-up appointment.   Any Other Special Instructions Will Be Listed Below (If Applicable).     If you need a refill on your cardiac medications before your next appointment, please call your pharmacy.      Sumner Boast, PA-C  01/08/2017 11:31 AM    [facility] [address],  [place], [place]  [address] Phone: [phone]; Fax: [phone]

## 2017-01-15 ENCOUNTER — Other Ambulatory Visit: Payer: Self-pay

## 2017-01-15 ENCOUNTER — Ambulatory Visit (HOSPITAL_COMMUNITY): Payer: Medicare Other | Attending: Cardiovascular Disease

## 2017-01-15 DIAGNOSIS — R011 Cardiac murmur, unspecified: Secondary | ICD-10-CM | POA: Diagnosis not present

## 2017-01-15 DIAGNOSIS — I340 Nonrheumatic mitral (valve) insufficiency: Secondary | ICD-10-CM | POA: Insufficient documentation

## 2017-02-15 ENCOUNTER — Other Ambulatory Visit (HOSPITAL_BASED_OUTPATIENT_CLINIC_OR_DEPARTMENT_OTHER): Payer: Self-pay | Admitting: Orthopedic Surgery

## 2017-02-15 DIAGNOSIS — G8929 Other chronic pain: Secondary | ICD-10-CM

## 2017-02-15 DIAGNOSIS — M545 Low back pain: Principal | ICD-10-CM

## 2017-02-23 ENCOUNTER — Ambulatory Visit (HOSPITAL_BASED_OUTPATIENT_CLINIC_OR_DEPARTMENT_OTHER)
Admission: RE | Admit: 2017-02-23 | Discharge: 2017-02-23 | Disposition: A | Discharge status: Home / Self Care | Payer: Medicare Other | Source: Ambulatory Visit | Attending: Orthopedic Surgery | Admitting: Orthopedic Surgery

## 2017-02-23 DIAGNOSIS — Z981 Arthrodesis status: Secondary | ICD-10-CM | POA: Diagnosis not present

## 2017-02-23 DIAGNOSIS — M545 Low back pain: Secondary | ICD-10-CM | POA: Insufficient documentation

## 2017-02-23 DIAGNOSIS — M48061 Spinal stenosis, lumbar region without neurogenic claudication: Secondary | ICD-10-CM | POA: Diagnosis not present

## 2017-02-23 DIAGNOSIS — G8929 Other chronic pain: Secondary | ICD-10-CM

## 2017-02-23 DIAGNOSIS — M5136 Other intervertebral disc degeneration, lumbar region: Secondary | ICD-10-CM | POA: Diagnosis not present

## 2017-02-23 MED ORDER — GADOBENATE DIMEGLUMINE 529 MG/ML IV SOLN
20.0000 mL | Freq: Once | INTRAVENOUS | Status: AC | PRN
  Administered 2017-02-23: 18 mL via INTRAVENOUS

## 2017-07-10 DIAGNOSIS — Y92009 Unspecified place in unspecified non-institutional (private) residence as the place of occurrence of the external cause: Secondary | ICD-10-CM

## 2017-07-10 DIAGNOSIS — W19XXXA Unspecified fall, initial encounter: Secondary | ICD-10-CM

## 2017-07-10 HISTORY — DX: Unspecified place in unspecified non-institutional (private) residence as the place of occurrence of the external cause: Y92.009

## 2017-07-10 HISTORY — DX: Unspecified place in unspecified non-institutional (private) residence as the place of occurrence of the external cause: W19.XXXA

## 2017-08-13 ENCOUNTER — Other Ambulatory Visit: Payer: Self-pay

## 2017-08-13 ENCOUNTER — Encounter (HOSPITAL_BASED_OUTPATIENT_CLINIC_OR_DEPARTMENT_OTHER): Payer: Self-pay | Admitting: Emergency Medicine

## 2017-08-13 ENCOUNTER — Emergency Department (HOSPITAL_BASED_OUTPATIENT_CLINIC_OR_DEPARTMENT_OTHER)
Admission: EM | Admit: 2017-08-13 | Discharge: 2017-08-13 | Disposition: A | Discharge status: Home / Self Care | Payer: Medicare Other | Attending: Emergency Medicine | Admitting: Emergency Medicine

## 2017-08-13 ENCOUNTER — Emergency Department (HOSPITAL_BASED_OUTPATIENT_CLINIC_OR_DEPARTMENT_OTHER): Payer: Medicare Other

## 2017-08-13 DIAGNOSIS — Z7982 Long term (current) use of aspirin: Secondary | ICD-10-CM | POA: Diagnosis not present

## 2017-08-13 DIAGNOSIS — Z87891 Personal history of nicotine dependence: Secondary | ICD-10-CM | POA: Diagnosis not present

## 2017-08-13 DIAGNOSIS — Z79899 Other long term (current) drug therapy: Secondary | ICD-10-CM | POA: Diagnosis not present

## 2017-08-13 DIAGNOSIS — J90 Pleural effusion, not elsewhere classified: Secondary | ICD-10-CM | POA: Insufficient documentation

## 2017-08-13 DIAGNOSIS — I10 Essential (primary) hypertension: Secondary | ICD-10-CM | POA: Insufficient documentation

## 2017-08-13 DIAGNOSIS — R0602 Shortness of breath: Secondary | ICD-10-CM | POA: Diagnosis present

## 2017-08-13 LAB — CBC WITH DIFFERENTIAL/PLATELET
Basophils Absolute: 0 10*3/uL (ref 0.0–0.1)
Basophils Relative: 0 %
Eosinophils Absolute: 0.1 10*3/uL (ref 0.0–0.7)
Eosinophils Relative: 1 %
HCT: 35.6 % — ABNORMAL LOW (ref 39.0–52.0)
Hemoglobin: 11.6 g/dL — ABNORMAL LOW (ref 13.0–17.0)
Lymphocytes Relative: 3 %
Lymphs Abs: 0.3 10*3/uL — ABNORMAL LOW (ref 0.7–4.0)
MCH: 31.9 pg (ref 26.0–34.0)
MCHC: 32.6 g/dL (ref 30.0–36.0)
MCV: 97.8 fL (ref 78.0–100.0)
Monocytes Absolute: 0.7 10*3/uL (ref 0.1–1.0)
Monocytes Relative: 7 %
Neutro Abs: 8.9 10*3/uL — ABNORMAL HIGH (ref 1.7–7.7)
Neutrophils Relative %: 89 %
Platelets: 208 10*3/uL (ref 150–400)
RBC: 3.64 MIL/uL — ABNORMAL LOW (ref 4.22–5.81)
RDW: 14.3 % (ref 11.5–15.5)
WBC: 10 10*3/uL (ref 4.0–10.5)

## 2017-08-13 LAB — TROPONIN I: Troponin I: <0.03 ng/mL (ref <0.03)

## 2017-08-13 LAB — COMPREHENSIVE METABOLIC PANEL
ALT: 10 U/L — ABNORMAL LOW (ref 17–63)
AST: 17 U/L (ref 15–41)
Albumin: 3.2 g/dL — ABNORMAL LOW (ref 3.5–5.0)
Alkaline Phosphatase: 71 U/L (ref 38–126)
Anion gap: 7 (ref 5–15)
BUN: 24 mg/dL — ABNORMAL HIGH (ref 6–20)
CO2: 28 mmol/L (ref 22–32)
Calcium: 8.8 mg/dL — ABNORMAL LOW (ref 8.9–10.3)
Chloride: 98 mmol/L — ABNORMAL LOW (ref 101–111)
Creatinine, Ser: 1.45 mg/dL — ABNORMAL HIGH (ref 0.61–1.24)
GFR calc Af Amer: 50 mL/min — ABNORMAL LOW (ref >60)
GFR calc non Af Amer: 43 mL/min — ABNORMAL LOW (ref >60)
Glucose, Bld: 145 mg/dL — ABNORMAL HIGH (ref 65–99)
Potassium: 3.4 mmol/L — ABNORMAL LOW (ref 3.5–5.1)
Sodium: 133 mmol/L — ABNORMAL LOW (ref 135–145)
Total Bilirubin: 1.4 mg/dL — ABNORMAL HIGH (ref 0.3–1.2)
Total Protein: 6.4 g/dL — ABNORMAL LOW (ref 6.5–8.1)

## 2017-08-13 LAB — BRAIN NATRIURETIC PEPTIDE: B Natriuretic Peptide: 79.2 pg/mL (ref 0.0–100.0)

## 2017-08-13 LAB — D-DIMER, QUANTITATIVE: D-Dimer, Quant: 0.81 ug/mL-FEU — ABNORMAL HIGH (ref 0.00–0.50)

## 2017-08-13 MED ORDER — OXYCODONE-ACETAMINOPHEN 5-325 MG PO TABS
1.0000 | ORAL_TABLET | Freq: Once | ORAL | Status: AC
  Administered 2017-08-13: 1 via ORAL
  Filled 2017-08-13: qty 1

## 2017-08-13 MED ORDER — OXYCODONE-ACETAMINOPHEN 5-325 MG PO TABS: 1.0000 | ORAL_TABLET | Freq: Four times a day (QID) | ORAL | 0 refills | Status: DC | PRN

## 2017-08-13 MED ORDER — MORPHINE SULFATE (PF) 2 MG/ML IV SOLN
2.0000 mg | Freq: Once | INTRAVENOUS | Status: AC
  Administered 2017-08-13: 2 mg via INTRAVENOUS
  Filled 2017-08-13: qty 1

## 2017-08-13 MED ORDER — IOPAMIDOL (ISOVUE-370) INJECTION 76%
100.0000 mL | Freq: Once | INTRAVENOUS | Status: AC | PRN
  Administered 2017-08-13: 80 mL via INTRAVENOUS

## 2017-08-13 NOTE — ED Notes (Signed)
Pt ambulated in hallway maintaining O2 sats 93-95%; tolerated well; no dyspnea noted.

## 2017-08-13 NOTE — ED Triage Notes (Signed)
Pt c/o sob since yesterday.  No cough or fever.  Pt states he has pain under his left shoulder blade.

## 2017-08-13 NOTE — ED Provider Notes (Signed)
[facility] Provider Note   CSN: [id number] Arrival date & time: 08/13/17  1016     History   Chief Complaint Chief Complaint  Patient presents with  . Shortness of Breath    HPI Aaron Schmidt is a 83 y.o. male.  HPI Patient with gradually developing left thoracic back pain starting yesterday.  Progressed overnight with shortness of breath.  Denies cough, fever or chills.  Denies new lower extremity swelling or pain.  Denies chest pain. Past Medical History:  Diagnosis Date  . Arthritis    Degeneration spine & stenosis  . Hypertension   . Sleep apnea 2012   used CPAP 2 yrs. ago, feels he sleeps better w/o, no longer using     Patient Active Problem List   Diagnosis Date Noted  . Murmur 01/08/2017  . Skin ulcer of toe of left foot, limited to breakdown of skin ([place]) 09/04/2016  . Cervical myelopathy with cervical radiculopathy 06/24/2014  . LBBB (left bundle branch block) 06/21/2014  . Hyperlipidemia 06/21/2014  . Preoperative cardiovascular examination 06/21/2014  . OBSTRUCTIVE SLEEP APNEA 09/30/2010  . Essential hypertension 08/29/2010    Past Surgical History:  Procedure Laterality Date  . ARM NEUROPLASTY     at 14 yrs. of age- fell off tractor- had fracture & repair *& later- 55's had  transplantation of a nerve at the elbow  . BACK SURGERY     x4 back surgery x2 fusion -  . CARPAL TUNNEL RELEASE Right   . POSTERIOR CERVICAL FUSION/FORAMINOTOMY N/A 06/24/2014   Procedure: Posterior Cervical Three-Seven Fusion with Lateral Mass Fixation;  Surgeon: Erline Levine, MD;  Location: [facility];  Service: Neurosurgery;  Laterality: N/A;  C3-C7 posterior cervical fusion with lateral mass fixation  . SHOULDER SURGERY Bilateral        Home Medications    Prior to Admission medications   Medication Sig Start Date End Date Taking? Authorizing Provider  aspirin EC 81 MG tablet Take 81 mg by mouth daily.    [provider]    b complex vitamins tablet Take 1 tablet by mouth daily.      [provider]  Cholecalciferol (VITAMIN D) 2000 UNITS CAPS Take 1 capsule by mouth daily.      [provider]  clobetasol (TEMOVATE) 0.05 % external solution Apply 1 application topically 2 (two) times daily.    [provider]  Docusate Calcium (STOOL SOFTENER PO) Take 3-4 tablets by mouth 2 (two) times daily. Takes 3 tablets every morning and 4 tablets every evening.    [provider]  doxazosin (CARDURA) 4 MG tablet Take 4 mg by mouth at bedtime.      [provider]  finasteride (PROSCAR) 5 MG tablet Take 5 mg by mouth daily.    [provider]  Flaxseed, Linseed, (FLAX SEED OIL) 1000 MG CAPS Take 1 capsule by mouth daily.      [provider]  folic acid (FOLVITE) 1 MG tablet Take 1 mg by mouth daily.    [provider]  HYDROmorphone (DILAUDID) 2 MG tablet Take 2 mg by mouth daily as needed for severe pain.     [provider]  L-Lysine 500 MG CAPS Take 1 capsule by mouth daily.      [provider]  methotrexate (RHEUMATREX) 2.5 MG tablet Take 2.5 mg by mouth once a week. Caution:Chemotherapy. Protect from light.    [provider]  Multiple Vitamin (MULTIVITAMIN) capsule  Take 1 capsule by mouth daily.      [provider]  nortriptyline (PAMELOR) 25 MG capsule Take 50 mg by mouth at bedtime.     [provider]  oxyCODONE-acetaminophen (PERCOCET) 5-325 MG tablet Take 1 tablet by mouth every 6 (six) hours as needed for severe pain. 08/13/17   Julianne Rice, MD  pregabalin (LYRICA) 150 MG capsule Take 150 mg by mouth 2 (two) times daily.    [provider]  pyridOXINE (VITAMIN B-6) 100 MG tablet Take 100 mg by mouth daily.      [provider]  saw palmetto 80 MG capsule Take 240 mg by mouth daily.     [provider]  Senna-Psyllium (PERDIEM PO) Take 3 tablets by mouth 2 (two) times  daily.     [provider]  simvastatin (ZOCOR) 40 MG tablet Take 40 mg by mouth at bedtime.      [provider]  tiZANidine (ZANAFLEX) 2 MG tablet Take 2 mg by mouth 3 (three) times daily as needed for muscle spasms.    [provider]  triamterene-hydrochlorothiazide (MAXZIDE-25) 37.5-25 MG per tablet Take 1 tablet by mouth daily.    [provider]    Family History No family history on file.  Social History Social History   Tobacco Use  . Smoking status: Former Smoker    Years: 24.00    Types: Pipe    Last attempt to quit: 09/24/1978    Years since quitting: 38.9  . Smokeless tobacco: Never Used  Substance Use Topics  . Alcohol use: Yes    Comment: 3 drinks/day - gin or vodka   . Drug use: No     Allergies   Diazepam   Review of Systems Review of Systems  Constitutional: Negative for chills and fever.  Respiratory: Positive for shortness of breath. Negative for cough and wheezing.   Cardiovascular: Negative for chest pain, palpitations and leg swelling.  Gastrointestinal: Negative for abdominal pain, diarrhea, nausea and vomiting.  Genitourinary: Negative for dysuria, flank pain and frequency.  Musculoskeletal: Positive for back pain. Negative for neck pain.  Skin: Negative for rash and wound.  Neurological: Negative for dizziness, weakness, light-headedness, numbness and headaches.  All other systems reviewed and are negative.    Physical Exam Updated Vital Signs BP 113/66   Pulse 89   Temp 98.9 F (37.2 C) (Oral)   Resp 15   Ht 5\' 10"  (1.778 m)   Wt 88 kg (194 lb)   SpO2 98%   BMI 27.84 kg/m   Physical Exam  Constitutional: He is oriented to person, place, and time. He appears well-developed and well-nourished.  Appears uncomfortable  HENT:  Head: Normocephalic and atraumatic.  Mouth/Throat: Oropharynx is clear and moist. No oropharyngeal exudate or posterior oropharyngeal edema.  Eyes: EOM are normal. Pupils are  equal, round, and reactive to light.  Neck: Normal range of motion. Neck supple.  Cardiovascular: Normal rate and regular rhythm.  Pulmonary/Chest: Effort normal. He has decreased breath sounds in the left lower field.  Abdominal: Soft. Bowel sounds are normal. There is no tenderness. There is no rebound and no guarding.  Musculoskeletal: Normal range of motion. He exhibits no edema or tenderness.  Mild diffuse left-sided thoracic back tenderness to palpation.  No midline thoracic tenderness.  No CVA tenderness.  No lower extremity swelling, asymmetry or tenderness.  Mild bilateral lower extremity swelling.  No calf tenderness.  Neurological: He is alert and oriented to person, place,  and time.  Skin: Skin is warm and dry. Capillary refill takes less than 2 seconds. No rash noted. No erythema.  Psychiatric: He has a normal mood and affect. His behavior is normal.  Nursing note and vitals reviewed.    ED Treatments / Results  Labs (all labs ordered are listed, but only abnormal results are displayed) Labs Reviewed  CBC WITH DIFFERENTIAL/PLATELET - Abnormal; Notable for the following components:      Result Value   RBC 3.64 (*)    Hemoglobin 11.6 (*)    HCT 35.6 (*)    Neutro Abs 8.9 (*)    Lymphs Abs 0.3 (*)    All other components within normal limits  COMPREHENSIVE METABOLIC PANEL - Abnormal; Notable for the following components:   Sodium 133 (*)    Potassium 3.4 (*)    Chloride 98 (*)    Glucose, Bld 145 (*)    BUN 24 (*)    Creatinine, Ser 1.45 (*)    Calcium 8.8 (*)    Total Protein 6.4 (*)    Albumin 3.2 (*)    ALT 10 (*)    Total Bilirubin 1.4 (*)    GFR calc non Af Amer 43 (*)    GFR calc Af Amer 50 (*)    All other components within normal limits  D-DIMER, QUANTITATIVE (NOT AT [facility]) - Abnormal; Notable for the following components:   D-Dimer, Quant 0.81 (*)    All other components within normal limits  BRAIN NATRIURETIC PEPTIDE  TROPONIN I    EKG  EKG  Interpretation  Date/Time:  Tuesday August 13 2017 10:38:28 EST Ventricular Rate:  90 PR Interval:    QRS Duration: 141 QT Interval:  410 QTC Calculation: 502 R Axis:   -30 Text Interpretation:  Sinus rhythm Probable left atrial enlargement Left bundle branch block Baseline wander in lead(s) V1 Confirmed by Julianne Rice [phone]) on 08/13/2017 10:42:16 AM       Radiology Dg Chest 2 View  Result Date: 08/13/2017 CLINICAL DATA:  Shortness of breath. EXAM: CHEST  2 VIEW COMPARISON:  Chest x-ray dated June 17, 2014. FINDINGS: The left heart border is obscured. The heart is normal size. Normal pulmonary vascularity. Moderate left pleural effusion with adjacent left basilar opacification. Loculated pleural fluid in the left major fissure. No pneumothorax. No acute osseous abnormality. IMPRESSION: New moderate left pleural effusion with adjacent left basilar opacification, atelectasis versus infiltrate. Electronically Signed   By: Titus Dubin M.D.   On: 08/13/2017 11:13   Ct Angio Chest Pe W And/or Wo Contrast  Result Date: 08/13/2017 CLINICAL DATA:  Shortness of breath, left chest pain EXAM: CT ANGIOGRAPHY CHEST WITH CONTRAST TECHNIQUE: Multidetector CT imaging of the chest was performed using the standard protocol during bolus administration of intravenous contrast. Multiplanar CT image reconstructions and MIPs were obtained to evaluate the vascular anatomy. CONTRAST:  71mL ISOVUE-370 IOPAMIDOL (ISOVUE-370) INJECTION 76% COMPARISON:  Chest radiographs dated 08/20/2017 FINDINGS: Cardiovascular: Satisfactory opacification the bilateral pulmonary arteries to the lobar level. No evidence of pulmonary embolism. Heart is top-normal in size.  No pericardial effusion. No evidence of thoracic aortic aneurysm. Mild atherosclerotic calcifications of the aortic arch. Mild coronary atherosclerosis of the LAD and left circumflex. Mediastinum/Nodes: No suspicious mediastinal lymphadenopathy.  Visualized thyroid is unremarkable. Lungs/Pleura: Moderate left pleural effusion, including a loculated fluid at the medial left lung apex and along the left major fissure. Associated compressive atelectasis in the lingula and left lower lobe. Right lung is essentially clear,  noting minimal dependent atelectasis in the right lower lobe. No suspicious pulmonary nodules. No pneumothorax. Upper Abdomen: Visualized upper abdomen is notable for a 13 mm right adrenal adenoma. Musculoskeletal: Degenerative changes of the visualized thoracolumbar spine. Review of the MIP images confirms the above findings. IMPRESSION: No evidence of pulmonary embolism. Moderate left pleural effusion, partially loculated. Associated lingular and left lower lobe atelectasis. Aortic Atherosclerosis (ICD10-I70.0). Electronically Signed   By: Julian Hy M.D.   On: 08/13/2017 12:41    Procedures Procedures (including critical care time)  Medications Ordered in ED Medications  morphine 2 MG/ML injection 2 mg (2 mg Intravenous Given 08/13/17 1052)  iopamidol (ISOVUE-370) 76 % injection 100 mL (80 mLs Intravenous Contrast Given 08/13/17 1159)  oxyCODONE-acetaminophen (PERCOCET/ROXICET) 5-325 MG per tablet 1 tablet (1 tablet Oral Given 08/13/17 1331)     Initial Impression / Assessment and Plan / ED Course  I have reviewed the triage vital signs and the nursing notes.  Pertinent labs & imaging results that were available during my care of the patient were reviewed by me and considered in my medical decision making (see chart for details).     Patient with loculated pleural effusion on the left.  No evidence of pneumonia or PE.  States he is feeling better after pain medication.  He is ambulating without significant drop in oxygenation.  His been given incentive spirometer in the emergency department and instructed on use.  He is advised to follow-up with pulmonology and strict return precautions have been given.  Final  Clinical Impressions(s) / ED Diagnoses   Final diagnoses:  Pleural effusion on left    ED Discharge Orders        Ordered    oxyCODONE-acetaminophen (PERCOCET) 5-325 MG tablet  Every 6 hours PRN     08/13/17 1344       Julianne Rice, MD 08/13/17 1435

## 2017-08-13 NOTE — Discharge Instructions (Signed)
Make sure you are using your incentive spirometer 3 times daily.  Call and make appointment to follow-up with the pulmonologist.

## 2017-08-19 ENCOUNTER — Ambulatory Visit: Payer: Medicare Other | Admitting: Internal Medicine

## 2017-08-19 ENCOUNTER — Encounter: Payer: Self-pay | Admitting: Internal Medicine

## 2017-08-19 VITALS — BP 134/78 | HR 99 | Ht 70.0 in | Wt 195.2 lb

## 2017-08-19 DIAGNOSIS — J329 Chronic sinusitis, unspecified: Secondary | ICD-10-CM | POA: Diagnosis not present

## 2017-08-19 DIAGNOSIS — J90 Pleural effusion, not elsewhere classified: Secondary | ICD-10-CM

## 2017-08-19 HISTORY — DX: Pleural effusion, not elsewhere classified: J90

## 2017-08-19 HISTORY — DX: Chronic sinusitis, unspecified: J32.9

## 2017-08-19 NOTE — Progress Notes (Signed)
Subjective:     Patient ID: Aaron Schmidt, male   DOB: 09-26-1932,    MRN: [id number]  HPI   67 yowm quit smoking 1980 with no resp sequelae then new L Chest discomfort/ painful to breath gradually came on  over several days > ER 08/13/17 with ? Loculated effusion on L by CT  > referred to pulmonary clinic 08/19/2017 by Dr  Lita Mains   08/19/2017 1st [facility] office visit/ [facility]   Chief Complaint  Patient presents with  . Pulmonary consult    ED doc at [facility], pleural effusion  tends to sleep on L or on the back, freq tooth infections x 2011 most recently in August  2018  Chronic yellow nasal discharge daily x years  No sob prior to onset of pain which he feels prevents him from taking a full deep breath and still able to lie flat  Doe now = MMRC4  = sob if tries to leave home or while getting dressed  Vs MMRC 0 prior to onset Has chronic back pain on opioids(dilaudid)  with prn supplemental percocets from er not effective to control the pain      No obvious day to day or daytime variability or assoc excess/ purulent sputum or mucus plugs or hemoptysis or   subjective wheeze or overt   hb symptoms. No unusual exp hx or h/o childhood pna/ asthma or knowledge of premature birth.  Sleeping ok flat without nocturnal  or early am exacerbation  of respiratory  c/o's or need for noct saba. Also denies any obvious fluctuation of symptoms with weather or environmental changes or other aggravating or alleviating factors except as outlined above   He worked for [employer], neg asbestos exp or known malignancy or connective tissue dz.  Current Allergies, Complete Past Medical History, Past Surgical History, Family History, and Social History were reviewed in [employer] record.  ROS  The following are not active complaints unless bolded Hoarseness, sore throat, dysphagia, dental problems, itching, sneezing,  nasal congestion or discharge of excess mucus or purulent  secretions, ear ache,   fever, chills, sweats, unintended wt loss or wt gain, classically  exertional cp,  orthopnea pnd or leg swelling, presyncope, palpitations, abdominal pain, anorexia, nausea, vomiting, diarrhea  or change in bowel habits or change in bladder habits, change in stools or change in urine, dysuria, hematuria,  rash, arthralgias, visual complaints, headache, numbness, weakness or ataxia or problems with walking or coordination,  change in mood/affect or memory.        Current Meds  Medication Sig  . aspirin EC 81 MG tablet Take 81 mg by mouth daily.  Marland Kitchen b complex vitamins tablet Take 1 tablet by mouth daily.    . Cholecalciferol (VITAMIN D) 2000 UNITS CAPS Take 1 capsule by mouth daily.    . clobetasol (TEMOVATE) 0.05 % external solution Apply 1 application topically 2 (two) times daily.  Mariane Baumgarten Calcium (STOOL SOFTENER PO) Take 3-4 tablets by mouth 2 (two) times daily. Takes 3 tablets every morning and 4 tablets every evening.  Marland Kitchen doxazosin (CARDURA) 4 MG tablet Take 4 mg by mouth at bedtime.    . finasteride (PROSCAR) 5 MG tablet Take 5 mg by mouth daily.  . Flaxseed, Linseed, (FLAX SEED OIL) 1000 MG CAPS Take 1 capsule by mouth daily.    . folic acid (FOLVITE) 1 MG tablet Take 1 mg by mouth daily.  Marland Kitchen HYDROmorphone (DILAUDID) 2 MG tablet Take 2 mg  by mouth daily as needed for severe pain.   Marland Kitchen L-Lysine 500 MG CAPS Take 1 capsule by mouth daily.    . methotrexate (RHEUMATREX) 2.5 MG tablet Take 2.5 mg by mouth once a week. Caution:Chemotherapy. Protect from light.  . Multiple Vitamin (MULTIVITAMIN) capsule Take 1 capsule by mouth daily.    . nortriptyline (PAMELOR) 25 MG capsule Take 50 mg by mouth at bedtime.   Marland Kitchen oxyCODONE-acetaminophen (PERCOCET) 5-325 MG tablet Take 1 tablet by mouth every 6 (six) hours as needed for severe pain.  . pregabalin (LYRICA) 150 MG capsule Take 150 mg by mouth 2 (two) times daily.  Marland Kitchen pyridOXINE (VITAMIN B-6) 100 MG tablet Take 100 mg by mouth  daily.    . saw palmetto 80 MG capsule Take 240 mg by mouth daily.   . Senna-Psyllium (PERDIEM PO) Take 3 tablets by mouth 2 (two) times daily.   . simvastatin (ZOCOR) 40 MG tablet Take 40 mg by mouth at bedtime.    Marland Kitchen tiZANidine (ZANAFLEX) 2 MG tablet Take 2 mg by mouth 3 (three) times daily as needed for muscle spasms.  Marland Kitchen triamterene-hydrochlorothiazide (MAXZIDE-25) 37.5-25 MG per tablet Take 1 tablet by mouth daily.        Review of Systems     Objective:   Physical Exam   Very somber chronically ill wm nad   Wt Readings from Last 3 Encounters:  08/19/17 195 lb 3.2 oz (88.5 kg)  08/13/17 194 lb (88 kg)  01/08/17 201 lb 12.8 oz (91.5 kg)     Vital signs reviewed  - Note on arrival 02 sats  99% on RA      HEENT: nl dentition, turbinates bilaterally, and oropharynx. Nl external ear canals without cough reflex   NECK :  without JVD/Nodes/TM/ nl carotid upstrokes bilaterally   LUNGS: no acc muscle use,  Bronchial bs with dullness L Base / wearing back belt    CV:  RRR  no s3 or murmur or increase in P2, and no edema   ABD:  soft and nontender with nl inspiratory excursion in the supine position. No bruits or organomegaly appreciated, bowel sounds nl  MS:  Nl gait/ ext warm without deformities, calf tenderness, cyanosis or clubbing No obvious joint restrictions   SKIN: warm and dry without lesions    NEURO:  alert, approp, nl sensorium with  no motor or cerebellar deficits apparent.      I personally reviewed images and agree with radiology impression as follows:   Chest CT a  08/13/17  No evidence of pulmonary embolism. Moderate left pleural effusion, partially loculated. Associated lingular and left lower lobe atelectasis.   Labs ordered/ reviewed:      Chemistry      Component Value Date/Time   NA 133 (L) 08/13/2017 1043   K 3.4 (L) 08/13/2017 1043   CL 98 (L) 08/13/2017 1043   CO2 28 08/13/2017 1043   BUN 24 (H) 08/13/2017 1043   CREATININE 1.45  (H) 08/13/2017 1043      Component Value Date/Time   CALCIUM 8.8 (L) 08/13/2017 1043   ALKPHOS 71 08/13/2017 1043   AST 17 08/13/2017 1043   ALT 10 (L) 08/13/2017 1043   BILITOT 1.4 (H) 08/13/2017 1043        Lab Results  Component Value Date   WBC 10.0 08/13/2017   HGB 11.6 (L) 08/13/2017   HCT 35.6 (L) 08/13/2017   MCV 97.8 08/13/2017   PLT 208 08/13/2017  EOS                       0.1                                                                    08/13/17           Assessment:

## 2017-08-19 NOTE — Patient Instructions (Addendum)
Stop the methotrexate for now due the pleural effusion   advil 200 mg 3-4 with a meal up to three times daily as needed for pain when you breath   Please see patient coordinator before you leave today  to schedule L thoracentesis asap and CT Scan sinus same visit - in meantime go ER if condition worsens

## 2017-08-20 ENCOUNTER — Ambulatory Visit (HOSPITAL_COMMUNITY)
Admission: RE | Admit: 2017-08-20 | Discharge: 2017-08-20 | Disposition: A | Discharge status: Home / Self Care | Payer: Medicare Other | Source: Ambulatory Visit | Attending: Radiology | Admitting: Radiology

## 2017-08-20 ENCOUNTER — Encounter: Payer: Self-pay | Admitting: Internal Medicine

## 2017-08-20 ENCOUNTER — Ambulatory Visit (HOSPITAL_BASED_OUTPATIENT_CLINIC_OR_DEPARTMENT_OTHER)
Admission: RE | Admit: 2017-08-20 | Discharge: 2017-08-20 | Disposition: A | Discharge status: Home / Self Care | Payer: Medicare Other | Source: Ambulatory Visit | Attending: Internal Medicine | Admitting: Internal Medicine

## 2017-08-20 ENCOUNTER — Ambulatory Visit (HOSPITAL_COMMUNITY)
Admission: RE | Admit: 2017-08-20 | Discharge: 2017-08-20 | Disposition: A | Discharge status: Home / Self Care | Payer: Medicare Other | Source: Ambulatory Visit | Attending: Internal Medicine | Admitting: Internal Medicine

## 2017-08-20 DIAGNOSIS — J329 Chronic sinusitis, unspecified: Secondary | ICD-10-CM | POA: Insufficient documentation

## 2017-08-20 DIAGNOSIS — J90 Pleural effusion, not elsewhere classified: Secondary | ICD-10-CM | POA: Diagnosis not present

## 2017-08-20 DIAGNOSIS — Z9889 Other specified postprocedural states: Secondary | ICD-10-CM | POA: Insufficient documentation

## 2017-08-20 LAB — BODY FLUID CELL COUNT WITH DIFFERENTIAL
Monocyte-Macrophage-Serous Fluid: 3 % — ABNORMAL LOW (ref 50–90)
Neutrophil Count, Fluid: 97 % — ABNORMAL HIGH (ref 0–25)
Total Nucleated Cell Count, Fluid: 5338 cu mm — ABNORMAL HIGH (ref 0–1000)

## 2017-08-20 LAB — LACTATE DEHYDROGENASE, PLEURAL OR PERITONEAL FLUID: LD, Fluid: 688 U/L — ABNORMAL HIGH (ref 3–23)

## 2017-08-20 LAB — AMYLASE, PLEURAL OR PERITONEAL FLUID: Amylase, Fluid: 30 U/L

## 2017-08-20 LAB — PROTEIN, PLEURAL OR PERITONEAL FLUID: Total protein, fluid: 3.7 g/dL

## 2017-08-20 LAB — GLUCOSE, PLEURAL OR PERITONEAL FLUID: Glucose, Fluid: <20 mg/dL

## 2017-08-20 MED ORDER — LIDOCAINE HCL 2 % IJ SOLN
INTRAMUSCULAR | Status: AC
  Filled 2017-08-20: qty 10

## 2017-08-20 NOTE — Assessment & Plan Note (Signed)
Acute symptoms since 08/13/17 > CTa  08/13/17  neg pe/ extensively loculated effusion - L thoracentesis 08/19/2017 >>>   My strongest suspicion here is that he has an empyema caused by a lung abscess we can't see but related to chronic dilaudid use in setting of poor dentition with recurrent dental infections or chronic sinusitis with reported daily purulent nasal discharge for years he's ignored to date.  Unfortunately since he is dilaudid dependent his pain won't be easy to treat with narcs as he is also very tolerant to opioids at this point so rec  Tap asap Try nsaids  Early refer to T surgery likely needed    Discussed in detail all the  indications, usual  risks and alternatives  relative to the benefits with patient who agrees to proceed with w/u as outlined.   Total time devoted to counseling  > 50 % of initial 60 min office visit:  review case with pt/wife discussion of options/alternatives/ personally creating written customized instructions  in presence of pt  then going over those specific  Instructions directly with the pt including how to use all of the meds but in particular covering each new medication in detail and the difference between the maintenance= "automatic" meds and the prns using an action plan format for the latter (If this problem/symptom => do that organization reading Left to right).  Please see AVS from this visit for a full list of these instructions which I personally wrote for this pt and  are unique to this visit.

## 2017-08-20 NOTE — Procedures (Addendum)
Ultrasound-guided diagnostic and therapeutic left thoracentesis performed yielding 220 cc of hazy, yellow fluid. No immediate complications. Follow-up chest x-ray pending.The fluid was sent to the lab for preordered studies. The pleural collection was multiloculated on today's exam and only the above amount of fluid could be removed today.

## 2017-08-20 NOTE — Assessment & Plan Note (Signed)
He has poor dentition but mostly in lower teeth so far but could well have an upper tooth problem the dilaudid use disguises so next step is sinus CT when goes for thoracentesis.

## 2017-08-21 ENCOUNTER — Other Ambulatory Visit: Payer: Self-pay | Admitting: Internal Medicine

## 2017-08-21 DIAGNOSIS — J90 Pleural effusion, not elsewhere classified: Secondary | ICD-10-CM

## 2017-08-23 LAB — BODY FLUID CULTURE: Culture: NO GROWTH

## 2017-08-26 ENCOUNTER — Institutional Professional Consult (permissible substitution): Payer: Medicare Other | Admitting: Pulmonary Disease

## 2017-08-29 ENCOUNTER — Encounter: Payer: Self-pay | Admitting: Thoracic Surgery (Cardiothoracic Vascular Surgery)

## 2017-08-29 ENCOUNTER — Institutional Professional Consult (permissible substitution): Payer: Medicare Other | Admitting: Thoracic Surgery (Cardiothoracic Vascular Surgery)

## 2017-08-29 ENCOUNTER — Other Ambulatory Visit: Payer: Self-pay

## 2017-08-29 VITALS — BP 109/66 | HR 100 | Ht 70.0 in | Wt 186.0 lb

## 2017-08-29 DIAGNOSIS — J90 Pleural effusion, not elsewhere classified: Secondary | ICD-10-CM

## 2017-08-29 DIAGNOSIS — G8928 Other chronic postprocedural pain: Secondary | ICD-10-CM

## 2017-08-29 NOTE — H&P (View-Only) (Signed)
PCP is Hulan Fess, MD Referring Provider is Tanda Rockers, MD  Chief Complaint  Patient presents with  . New Patient (Initial Visit)    pleural effusion on left, cta chest 08/13/2017    HPI: Aaron Schmidt is sent for consultation regarding a loculated left pleural effusion.  Aaron Schmidt is a 83 year old man with a past history of multiple spine surgeries, chronic narcotic dependent back pain, hypertension, BPH, and sleep apnea.  He was in his usual state of health until about 2 weeks ago when he developed shortness of breath and pleuritic left-sided chest pain.  Workup revealed a loculated left pleural effusion.  He was referred to Dr. Melvyn Novas.  He suspected a loculated parapneumonic effusion.  He does have a history of frequent tooth infections and chronic sinus drainage.  A thoracentesis drained about 200 mL of fluid but did not significantly change the appearance of his chest x-ray.  Cytology was negative.  LDH was markedly elevated.  He did not have any symptomatic improvement after the drainage.  He denies any substernal chest pain, pressure, or tightness.  He has no history of coronary disease.  He does have a heart murmur, but had an echo in April which showed no significant valvular pathology.  He has had a poor appetite and has lost about 9 pounds over the past 3 months.  He also complains of dizzy spells usually when he is standing up.  Those have worsened over the past couple of weeks as well.  He denies any prodromal fever, chills, night sweats, cough or wheezing.  He gets short of breath with even minimal activities.  Remote history of light tobacco abuse.  Quit in 1980  Zubrod Score: At the time of surgery this patient's most appropriate activity status/level should be described as: []     0    Normal activity, no symptoms []     1    Restricted in physical strenuous activity but ambulatory, able to do out light work [x]     2    Ambulatory and capable of self care, unable to do work  activities, up and about >50 % of waking hours                              []     3    Only limited self care, in bed greater than 50% of waking hours []     4    Completely disabled, no self care, confined to bed or chair []     5    Moribund  Past Medical History:  Diagnosis Date  . Arthritis    Degeneration spine & stenosis  . Hypertension   . Sleep apnea 2012   used CPAP 2 yrs. ago, feels he sleeps better w/o, no longer using     Past Surgical History:  Procedure Laterality Date  . ARM NEUROPLASTY     at 14 yrs. of age- fell off tractor- had fracture & repair *& later- 43's had  transplantation of a nerve at the elbow  . BACK SURGERY     x4 back surgery x2 fusion -  . CARPAL TUNNEL RELEASE Right   . POSTERIOR CERVICAL FUSION/FORAMINOTOMY N/A 06/24/2014   Procedure: Posterior Cervical Three-Seven Fusion with Lateral Mass Fixation;  Surgeon: Erline Levine, MD;  Location: [facility];  Service: Neurosurgery;  Laterality: N/A;  C3-C7 posterior cervical fusion with lateral mass fixation  . SHOULDER SURGERY Bilateral  No family history on file.  Social History Social History   Tobacco Use  . Smoking status: Former Smoker    Years: 24.00    Types: Pipe    Last attempt to quit: 09/24/1978    Years since quitting: 38.9  . Smokeless tobacco: Never Used  Substance Use Topics  . Alcohol use: Yes    Comment: 3 drinks/day - gin or vodka   . Drug use: No    Current Outpatient Medications  Medication Sig Dispense Refill  . finasteride (PROSCAR) 5 MG tablet Take 5 mg by mouth daily.    . pregabalin (LYRICA) 150 MG capsule Take 150 mg by mouth 2 (two) times daily.    Marland Kitchen pyridOXINE (VITAMIN B-6) 100 MG tablet Take 100 mg by mouth daily.      . simvastatin (ZOCOR) 40 MG tablet Take 40 mg by mouth at bedtime.      Marland Kitchen aspirin EC 81 MG tablet Take 81 mg by mouth daily.    Marland Kitchen b complex vitamins tablet Take 1 tablet by mouth daily.      . Cholecalciferol (VITAMIN D) 2000 UNITS CAPS Take 1  capsule by mouth daily.      . clobetasol (TEMOVATE) 0.05 % external solution Apply 1 application topically 2 (two) times daily.    Mariane Baumgarten Calcium (STOOL SOFTENER PO) Take 3-4 tablets by mouth 2 (two) times daily. Takes 3 tablets every morning and 4 tablets every evening.    Marland Kitchen doxazosin (CARDURA) 4 MG tablet Take 4 mg by mouth at bedtime.      . Flaxseed, Linseed, (FLAX SEED OIL) 1000 MG CAPS Take 1 capsule by mouth daily.      . folic acid (FOLVITE) 1 MG tablet Take 1 mg by mouth daily.    Marland Kitchen HYDROmorphone (DILAUDID) 2 MG tablet Take 2 mg by mouth daily as needed for severe pain.     Marland Kitchen L-Lysine 500 MG CAPS Take 1 capsule by mouth daily.      . methotrexate (RHEUMATREX) 2.5 MG tablet Take 2.5 mg by mouth once a week. Caution:Chemotherapy. Protect from light.    . Multiple Vitamin (MULTIVITAMIN) capsule Take 1 capsule by mouth daily.      . nortriptyline (PAMELOR) 25 MG capsule Take 50 mg by mouth at bedtime.     Marland Kitchen oxyCODONE-acetaminophen (PERCOCET) 5-325 MG tablet Take 1 tablet by mouth every 6 (six) hours as needed for severe pain. 10 tablet 0  . saw palmetto 80 MG capsule Take 240 mg by mouth daily.     . Senna-Psyllium (PERDIEM PO) Take 3 tablets by mouth 2 (two) times daily.     Marland Kitchen tiZANidine (ZANAFLEX) 2 MG tablet Take 2 mg by mouth 3 (three) times daily as needed for muscle spasms.    Marland Kitchen triamterene-hydrochlorothiazide (MAXZIDE-25) 37.5-25 MG per tablet Take 1 tablet by mouth daily.     No current facility-administered medications for this visit.     Allergies  Allergen Reactions  . Diazepam Other (See Comments)    REACTION: "makes goofy"    Review of Systems  Constitutional: Positive for activity change, appetite change, fatigue and unexpected weight change.  HENT: Positive for dental problem and hearing loss. Negative for trouble swallowing and voice change.   Eyes: Negative for visual disturbance.  Respiratory: Positive for apnea and shortness of breath. Negative for cough and  wheezing.   Cardiovascular: Positive for chest pain (Left side pleuritic). Negative for leg swelling.  Gastrointestinal: Positive for constipation. Negative for  diarrhea.  Genitourinary: Positive for frequency. Negative for dysuria.  Musculoskeletal: Positive for arthralgias, back pain and neck pain.  Neurological: Positive for dizziness. Negative for syncope and speech difficulty.  Hematological: Negative for adenopathy. Does not bruise/bleed easily.  All other systems reviewed and are negative.   There were no vitals taken for this visit. Physical Exam  Constitutional: He is oriented to person, place, and time.  Elderly man in obvious discomfort  HENT:  Head: Normocephalic and atraumatic.  Mouth/Throat: No oropharyngeal exudate.  Eyes: Conjunctivae and EOM are normal. No scleral icterus.  Neck: Neck supple. No thyromegaly present.  Cardiovascular: Normal rate and regular rhythm. Exam reveals no gallop and no friction rub.  Murmur (2/6 systolic) heard. Pulmonary/Chest: Effort normal. No respiratory distress. He has no wheezes. He has no rales.  Absent breath sounds with dullness to percussion left base  Abdominal: Soft. He exhibits no distension. There is no tenderness.  Musculoskeletal: Normal range of motion. He exhibits no edema or deformity.  Lymphadenopathy:    He has no cervical adenopathy.  Neurological: He is alert and oriented to person, place, and time. No cranial nerve deficit.  Motor grossly intact  Skin: Skin is warm and dry.  Vitals reviewed.    Diagnostic Tests: CT ANGIOGRAPHY CHEST WITH CONTRAST  TECHNIQUE: Multidetector CT imaging of the chest was performed using the standard protocol during bolus administration of intravenous contrast. Multiplanar CT image reconstructions and MIPs were obtained to evaluate the vascular anatomy.  CONTRAST:  6mL ISOVUE-370 IOPAMIDOL (ISOVUE-370) INJECTION 76%  COMPARISON:  Chest radiographs dated  08/20/2017  FINDINGS: Cardiovascular: Satisfactory opacification the bilateral pulmonary arteries to the lobar level. No evidence of pulmonary embolism.  Heart is top-normal in size.  No pericardial effusion.  No evidence of thoracic aortic aneurysm. Mild atherosclerotic calcifications of the aortic arch.  Mild coronary atherosclerosis of the LAD and left circumflex.  Mediastinum/Nodes: No suspicious mediastinal lymphadenopathy.  Visualized thyroid is unremarkable.  Lungs/Pleura: Moderate left pleural effusion, including a loculated fluid at the medial left lung apex and along the left major fissure.  Associated compressive atelectasis in the lingula and left lower lobe.  Right lung is essentially clear, noting minimal dependent atelectasis in the right lower lobe.  No suspicious pulmonary nodules.  No pneumothorax.  Upper Abdomen: Visualized upper abdomen is notable for a 13 mm right adrenal adenoma.  Musculoskeletal: Degenerative changes of the visualized thoracolumbar spine.  Review of the MIP images confirms the above findings.  IMPRESSION: No evidence of pulmonary embolism.  Moderate left pleural effusion, partially loculated.  Associated lingular and left lower lobe atelectasis.  Aortic Atherosclerosis (ICD10-I70.0).   Electronically Signed   By: Julian Hy M.D.   On: 08/13/2017 12:41 I personally reviewed the CT images and concur with the findings noted above  Impression: Aaron Schmidt is an 83 year old man who presented on November 26 with a 1 day history of pleuritic left-sided chest pain and shortness of breath.  Workup revealed a loculated pleural effusion.  Thoracentesis showed an exudate with a markedly elevated LDH.  Cytology was negative.  I agree with Dr. Gustavus Bryant assessment that this most likely is a parapneumonic effusion.  I recommended to Aaron Schmidt that we do bronchoscopy to rule out any endobronchial lesion followed  by left VATS for drainage of the pleural effusion and possible decortication of the lung.  I described the operation in detail to Mr. and Mrs. Schmidt.  They understand the general nature of the procedure including the need for  general anesthesia, the incisions to be used, the need for drainage tubes postoperatively, the expected hospital stay, and the overall recovery.  I reviewed the indications, risks, benefits, and alternatives.  They understand the risks include, but are not limited to death, MI, DVT, PE, stroke, bleeding, possible need for transfusion, infection, prolonged air leak, irregular heart rhythms, the pain issues, as well as the possibility of other unforeseeable complications.  He accepts the risks and wishes to proceed  Chronic pain -pain control could be a significant issue.  To clarify his narcotic regimen- He is using Dilaudid 1 mg 4 times daily for pain.  He has a prescription for oxycodone that he received in the emergency room.  He only used that one time and has not used it since then.  Benign prostatic hypertrophy-controlled on current regimen  Chronic constipation secondary to opioid use-remains problematic to him.  Uses mineral oil frequently.  Heart murmur-echo in April 2018 showed preserved left ventricular function and mild mitral regurgitation.  Plan: Bronchoscopy, left VATS, drainage of pleural effusion, decortication on Wednesday, 09/04/2017.  [name], MD [facility] [phone]

## 2017-08-29 NOTE — Progress Notes (Addendum)
PCP is Hulan Fess, MD Referring Provider is Tanda Rockers, MD  Chief Complaint  Patient presents with  . New Patient (Initial Visit)    pleural effusion on left, cta chest 08/13/2017    HPI: Aaron Schmidt is sent for consultation regarding a loculated left pleural effusion.  Aaron Schmidt is a 83 year old man with a past history of multiple spine surgeries, chronic narcotic dependent back pain, hypertension, BPH, and sleep apnea.  He was in his usual state of health until about 2 weeks ago when he developed shortness of breath and pleuritic left-sided chest pain.  Workup revealed a loculated left pleural effusion.  He was referred to Dr. Melvyn Novas.  He suspected a loculated parapneumonic effusion.  He does have a history of frequent tooth infections and chronic sinus drainage.  A thoracentesis drained about 200 mL of fluid but did not significantly change the appearance of his chest x-ray.  Cytology was negative.  LDH was markedly elevated.  He did not have any symptomatic improvement after the drainage.  He denies any substernal chest pain, pressure, or tightness.  He has no history of coronary disease.  He does have a heart murmur, but had an echo in April which showed no significant valvular pathology.  He has had a poor appetite and has lost about 9 pounds over the past 3 months.  He also complains of dizzy spells usually when he is standing up.  Those have worsened over the past couple of weeks as well.  He denies any prodromal fever, chills, night sweats, cough or wheezing.  He gets short of breath with even minimal activities.  Remote history of light tobacco abuse.  Quit in 1980  Zubrod Score: At the time of surgery this patient's most appropriate activity status/level should be described as: []     0    Normal activity, no symptoms []     1    Restricted in physical strenuous activity but ambulatory, able to do out light work [x]     2    Ambulatory and capable of self care, unable to do work  activities, up and about >50 % of waking hours                              []     3    Only limited self care, in bed greater than 50% of waking hours []     4    Completely disabled, no self care, confined to bed or chair []     5    Moribund  Past Medical History:  Diagnosis Date  . Arthritis    Degeneration spine & stenosis  . Hypertension   . Sleep apnea 2012   used CPAP 2 yrs. ago, feels he sleeps better w/o, no longer using     Past Surgical History:  Procedure Laterality Date  . ARM NEUROPLASTY     at 14 yrs. of age- fell off tractor- had fracture & repair *& later- 63's had  transplantation of a nerve at the elbow  . BACK SURGERY     x4 back surgery x2 fusion -  . CARPAL TUNNEL RELEASE Right   . POSTERIOR CERVICAL FUSION/FORAMINOTOMY N/A 06/24/2014   Procedure: Posterior Cervical Three-Seven Fusion with Lateral Mass Fixation;  Surgeon: Erline Levine, MD;  Location: [facility];  Service: Neurosurgery;  Laterality: N/A;  C3-C7 posterior cervical fusion with lateral mass fixation  . SHOULDER SURGERY Bilateral  No family history on file.  Social History Social History   Tobacco Use  . Smoking status: Former Smoker    Years: 24.00    Types: Pipe    Last attempt to quit: 09/24/1978    Years since quitting: 38.9  . Smokeless tobacco: Never Used  Substance Use Topics  . Alcohol use: Yes    Comment: 3 drinks/day - gin or vodka   . Drug use: No    Current Outpatient Medications  Medication Sig Dispense Refill  . finasteride (PROSCAR) 5 MG tablet Take 5 mg by mouth daily.    . pregabalin (LYRICA) 150 MG capsule Take 150 mg by mouth 2 (two) times daily.    Marland Kitchen pyridOXINE (VITAMIN B-6) 100 MG tablet Take 100 mg by mouth daily.      . simvastatin (ZOCOR) 40 MG tablet Take 40 mg by mouth at bedtime.      Marland Kitchen aspirin EC 81 MG tablet Take 81 mg by mouth daily.    Marland Kitchen b complex vitamins tablet Take 1 tablet by mouth daily.      . Cholecalciferol (VITAMIN D) 2000 UNITS CAPS Take 1  capsule by mouth daily.      . clobetasol (TEMOVATE) 0.05 % external solution Apply 1 application topically 2 (two) times daily.    Mariane Baumgarten Calcium (STOOL SOFTENER PO) Take 3-4 tablets by mouth 2 (two) times daily. Takes 3 tablets every morning and 4 tablets every evening.    Marland Kitchen doxazosin (CARDURA) 4 MG tablet Take 4 mg by mouth at bedtime.      . Flaxseed, Linseed, (FLAX SEED OIL) 1000 MG CAPS Take 1 capsule by mouth daily.      . folic acid (FOLVITE) 1 MG tablet Take 1 mg by mouth daily.    Marland Kitchen HYDROmorphone (DILAUDID) 2 MG tablet Take 2 mg by mouth daily as needed for severe pain.     Marland Kitchen L-Lysine 500 MG CAPS Take 1 capsule by mouth daily.      . methotrexate (RHEUMATREX) 2.5 MG tablet Take 2.5 mg by mouth once a week. Caution:Chemotherapy. Protect from light.    . Multiple Vitamin (MULTIVITAMIN) capsule Take 1 capsule by mouth daily.      . nortriptyline (PAMELOR) 25 MG capsule Take 50 mg by mouth at bedtime.     Marland Kitchen oxyCODONE-acetaminophen (PERCOCET) 5-325 MG tablet Take 1 tablet by mouth every 6 (six) hours as needed for severe pain. 10 tablet 0  . saw palmetto 80 MG capsule Take 240 mg by mouth daily.     . Senna-Psyllium (PERDIEM PO) Take 3 tablets by mouth 2 (two) times daily.     Marland Kitchen tiZANidine (ZANAFLEX) 2 MG tablet Take 2 mg by mouth 3 (three) times daily as needed for muscle spasms.    Marland Kitchen triamterene-hydrochlorothiazide (MAXZIDE-25) 37.5-25 MG per tablet Take 1 tablet by mouth daily.     No current facility-administered medications for this visit.     Allergies  Allergen Reactions  . Diazepam Other (See Comments)    REACTION: "makes goofy"    Review of Systems  Constitutional: Positive for activity change, appetite change, fatigue and unexpected weight change.  HENT: Positive for dental problem and hearing loss. Negative for trouble swallowing and voice change.   Eyes: Negative for visual disturbance.  Respiratory: Positive for apnea and shortness of breath. Negative for cough and  wheezing.   Cardiovascular: Positive for chest pain (Left side pleuritic). Negative for leg swelling.  Gastrointestinal: Positive for constipation. Negative for  diarrhea.  Genitourinary: Positive for frequency. Negative for dysuria.  Musculoskeletal: Positive for arthralgias, back pain and neck pain.  Neurological: Positive for dizziness. Negative for syncope and speech difficulty.  Hematological: Negative for adenopathy. Does not bruise/bleed easily.  All other systems reviewed and are negative.   There were no vitals taken for this visit. Physical Exam  Constitutional: He is oriented to person, place, and time.  Elderly man in obvious discomfort  HENT:  Head: Normocephalic and atraumatic.  Mouth/Throat: No oropharyngeal exudate.  Eyes: Conjunctivae and EOM are normal. No scleral icterus.  Neck: Neck supple. No thyromegaly present.  Cardiovascular: Normal rate and regular rhythm. Exam reveals no gallop and no friction rub.  Murmur (2/6 systolic) heard. Pulmonary/Chest: Effort normal. No respiratory distress. He has no wheezes. He has no rales.  Absent breath sounds with dullness to percussion left base  Abdominal: Soft. He exhibits no distension. There is no tenderness.  Musculoskeletal: Normal range of motion. He exhibits no edema or deformity.  Lymphadenopathy:    He has no cervical adenopathy.  Neurological: He is alert and oriented to person, place, and time. No cranial nerve deficit.  Motor grossly intact  Skin: Skin is warm and dry.  Vitals reviewed.    Diagnostic Tests: CT ANGIOGRAPHY CHEST WITH CONTRAST  TECHNIQUE: Multidetector CT imaging of the chest was performed using the standard protocol during bolus administration of intravenous contrast. Multiplanar CT image reconstructions and MIPs were obtained to evaluate the vascular anatomy.  CONTRAST:  70mL ISOVUE-370 IOPAMIDOL (ISOVUE-370) INJECTION 76%  COMPARISON:  Chest radiographs dated  08/20/2017  FINDINGS: Cardiovascular: Satisfactory opacification the bilateral pulmonary arteries to the lobar level. No evidence of pulmonary embolism.  Heart is top-normal in size.  No pericardial effusion.  No evidence of thoracic aortic aneurysm. Mild atherosclerotic calcifications of the aortic arch.  Mild coronary atherosclerosis of the LAD and left circumflex.  Mediastinum/Nodes: No suspicious mediastinal lymphadenopathy.  Visualized thyroid is unremarkable.  Lungs/Pleura: Moderate left pleural effusion, including a loculated fluid at the medial left lung apex and along the left major fissure.  Associated compressive atelectasis in the lingula and left lower lobe.  Right lung is essentially clear, noting minimal dependent atelectasis in the right lower lobe.  No suspicious pulmonary nodules.  No pneumothorax.  Upper Abdomen: Visualized upper abdomen is notable for a 13 mm right adrenal adenoma.  Musculoskeletal: Degenerative changes of the visualized thoracolumbar spine.  Review of the MIP images confirms the above findings.  IMPRESSION: No evidence of pulmonary embolism.  Moderate left pleural effusion, partially loculated.  Associated lingular and left lower lobe atelectasis.  Aortic Atherosclerosis (ICD10-I70.0).   Electronically Signed   By: Julian Hy M.D.   On: 08/13/2017 12:41 I personally reviewed the CT images and concur with the findings noted above  Impression: Aaron Schmidt is an 83 year old man who presented on November 26 with a 1 day history of pleuritic left-sided chest pain and shortness of breath.  Workup revealed a loculated pleural effusion.  Thoracentesis showed an exudate with a markedly elevated LDH.  Cytology was negative.  I agree with Dr. Gustavus Bryant assessment that this most likely is a parapneumonic effusion.  I recommended to Aaron Schmidt that we do bronchoscopy to rule out any endobronchial lesion followed  by left VATS for drainage of the pleural effusion and possible decortication of the lung.  I described the operation in detail to Mr. and Mrs. Schmidt.  They understand the general nature of the procedure including the need for  general anesthesia, the incisions to be used, the need for drainage tubes postoperatively, the expected hospital stay, and the overall recovery.  I reviewed the indications, risks, benefits, and alternatives.  They understand the risks include, but are not limited to death, MI, DVT, PE, stroke, bleeding, possible need for transfusion, infection, prolonged air leak, irregular heart rhythms, the pain issues, as well as the possibility of other unforeseeable complications.  He accepts the risks and wishes to proceed  Chronic pain -pain control could be a significant issue.  To clarify his narcotic regimen- He is using Dilaudid 1 mg 4 times daily for pain.  He has a prescription for oxycodone that he received in the emergency room.  He only used that one time and has not used it since then.  Benign prostatic hypertrophy-controlled on current regimen  Chronic constipation secondary to opioid use-remains problematic to him.  Uses mineral oil frequently.  Heart murmur-echo in April 2018 showed preserved left ventricular function and mild mitral regurgitation.  Plan: Bronchoscopy, left VATS, drainage of pleural effusion, decortication on Wednesday, 09/04/2017.  [name], MD [facility] [phone]

## 2017-08-30 NOTE — Pre-Procedure Instructions (Signed)
    Aaron Schmidt  08/30/2017      [place], [place] [address] Phone: [phone] Fax: [phone]  [facility] - [place], [place] - [address] AT [place] [address] [place] [address] Phone: [phone] Fax: [phone]    Your procedure is scheduled on 09/04/17  Report to [facility] Admitting at 630 A.M.  Call this number if you have problems the morning of surgery:  [phone]   Remember:  Do not eat food or drink liquids after midnight.  Take these medicines the morning of surgery with A SIP OF WATER --proscar,lyrica   Do not wear jewelry, make-up or nail polish.  Do not wear lotions, powders, or perfumes, or deoderant.  Do not shave 48 hours prior to surgery.  Men may shave face and neck.  Do not bring valuables to the hospital.  [facility] is not responsible for any belongings or valuables.  Contacts, dentures or bridgework may not be worn into surgery.  Leave your suitcase in the car.  After surgery it may be brought to your room.  For patients admitted to the hospital, discharge time will be determined by your treatment team.  Patients discharged the day of surgery will not be allowed to drive home.   Name and phone number of your driver:    Special instructions:  Do not take any aspirin,anti-inflammatories,vitamins,or herbal supplements 5-7 days prior to surgery.  Please read over the following fact sheets that you were given. MRSA Information

## 2017-09-02 ENCOUNTER — Encounter (HOSPITAL_COMMUNITY): Payer: Self-pay

## 2017-09-02 ENCOUNTER — Encounter (HOSPITAL_COMMUNITY): Payer: Medicare Other

## 2017-09-02 ENCOUNTER — Other Ambulatory Visit: Payer: Self-pay

## 2017-09-02 ENCOUNTER — Inpatient Hospital Stay (HOSPITAL_COMMUNITY)
Admission: RE | Admit: 2017-09-02 | Discharge: 2017-09-02 | Disposition: A | Discharge status: Home / Self Care | Payer: Medicare Other | Source: Ambulatory Visit

## 2017-09-02 HISTORY — DX: Hypotension, unspecified: I95.9

## 2017-09-02 HISTORY — DX: Personal history of urinary calculi: Z87.442

## 2017-09-04 ENCOUNTER — Encounter (HOSPITAL_BASED_OUTPATIENT_CLINIC_OR_DEPARTMENT_OTHER): Payer: Self-pay

## 2017-09-04 ENCOUNTER — Emergency Department (HOSPITAL_BASED_OUTPATIENT_CLINIC_OR_DEPARTMENT_OTHER): Payer: Medicare Other

## 2017-09-04 ENCOUNTER — Inpatient Hospital Stay (HOSPITAL_BASED_OUTPATIENT_CLINIC_OR_DEPARTMENT_OTHER)
Admission: EM | Admit: 2017-09-04 | Discharge: 2017-09-14 | DRG: 853 | Disposition: A | Discharge status: Home Health | Payer: Medicare Other | Attending: Family Medicine | Admitting: Family Medicine

## 2017-09-04 ENCOUNTER — Other Ambulatory Visit: Payer: Self-pay

## 2017-09-04 DIAGNOSIS — Z87891 Personal history of nicotine dependence: Secondary | ICD-10-CM

## 2017-09-04 DIAGNOSIS — J869 Pyothorax without fistula: Secondary | ICD-10-CM | POA: Diagnosis present

## 2017-09-04 DIAGNOSIS — N189 Chronic kidney disease, unspecified: Secondary | ICD-10-CM | POA: Diagnosis present

## 2017-09-04 DIAGNOSIS — G4733 Obstructive sleep apnea (adult) (pediatric): Secondary | ICD-10-CM | POA: Diagnosis present

## 2017-09-04 DIAGNOSIS — R531 Weakness: Secondary | ICD-10-CM

## 2017-09-04 DIAGNOSIS — K5909 Other constipation: Secondary | ICD-10-CM | POA: Diagnosis present

## 2017-09-04 DIAGNOSIS — Y95 Nosocomial condition: Secondary | ICD-10-CM | POA: Diagnosis present

## 2017-09-04 DIAGNOSIS — N183 Chronic kidney disease, stage 3 (moderate): Secondary | ICD-10-CM | POA: Diagnosis present

## 2017-09-04 DIAGNOSIS — E878 Other disorders of electrolyte and fluid balance, not elsewhere classified: Secondary | ICD-10-CM | POA: Diagnosis present

## 2017-09-04 DIAGNOSIS — N179 Acute kidney failure, unspecified: Secondary | ICD-10-CM | POA: Diagnosis present

## 2017-09-04 DIAGNOSIS — K5903 Drug induced constipation: Secondary | ICD-10-CM | POA: Diagnosis present

## 2017-09-04 DIAGNOSIS — E860 Dehydration: Secondary | ICD-10-CM | POA: Diagnosis present

## 2017-09-04 DIAGNOSIS — J918 Pleural effusion in other conditions classified elsewhere: Secondary | ICD-10-CM | POA: Diagnosis present

## 2017-09-04 DIAGNOSIS — I2510 Atherosclerotic heart disease of native coronary artery without angina pectoris: Secondary | ICD-10-CM | POA: Diagnosis present

## 2017-09-04 DIAGNOSIS — E43 Unspecified severe protein-calorie malnutrition: Secondary | ICD-10-CM | POA: Diagnosis present

## 2017-09-04 DIAGNOSIS — J90 Pleural effusion, not elsewhere classified: Secondary | ICD-10-CM

## 2017-09-04 DIAGNOSIS — I10 Essential (primary) hypertension: Secondary | ICD-10-CM | POA: Diagnosis not present

## 2017-09-04 DIAGNOSIS — N400 Enlarged prostate without lower urinary tract symptoms: Secondary | ICD-10-CM | POA: Diagnosis present

## 2017-09-04 DIAGNOSIS — I700 Atherosclerosis of aorta: Secondary | ICD-10-CM | POA: Diagnosis present

## 2017-09-04 DIAGNOSIS — J9811 Atelectasis: Secondary | ICD-10-CM | POA: Diagnosis present

## 2017-09-04 DIAGNOSIS — R6510 Systemic inflammatory response syndrome (SIRS) of non-infectious origin without acute organ dysfunction: Secondary | ICD-10-CM | POA: Diagnosis not present

## 2017-09-04 DIAGNOSIS — E785 Hyperlipidemia, unspecified: Secondary | ICD-10-CM | POA: Diagnosis present

## 2017-09-04 DIAGNOSIS — E876 Hypokalemia: Secondary | ICD-10-CM | POA: Diagnosis present

## 2017-09-04 DIAGNOSIS — D649 Anemia, unspecified: Secondary | ICD-10-CM | POA: Diagnosis present

## 2017-09-04 DIAGNOSIS — E871 Hypo-osmolality and hyponatremia: Secondary | ICD-10-CM | POA: Diagnosis present

## 2017-09-04 DIAGNOSIS — T402X5A Adverse effect of other opioids, initial encounter: Secondary | ICD-10-CM | POA: Diagnosis present

## 2017-09-04 DIAGNOSIS — J189 Pneumonia, unspecified organism: Secondary | ICD-10-CM | POA: Diagnosis present

## 2017-09-04 DIAGNOSIS — R778 Other specified abnormalities of plasma proteins: Secondary | ICD-10-CM | POA: Diagnosis present

## 2017-09-04 DIAGNOSIS — A403 Sepsis due to Streptococcus pneumoniae: Secondary | ICD-10-CM | POA: Diagnosis not present

## 2017-09-04 DIAGNOSIS — M545 Low back pain: Secondary | ICD-10-CM | POA: Diagnosis present

## 2017-09-04 DIAGNOSIS — I340 Nonrheumatic mitral (valve) insufficiency: Secondary | ICD-10-CM | POA: Diagnosis present

## 2017-09-04 DIAGNOSIS — G894 Chronic pain syndrome: Secondary | ICD-10-CM | POA: Diagnosis not present

## 2017-09-04 DIAGNOSIS — Z79899 Other long term (current) drug therapy: Secondary | ICD-10-CM

## 2017-09-04 DIAGNOSIS — Z888 Allergy status to other drugs, medicaments and biological substances status: Secondary | ICD-10-CM

## 2017-09-04 DIAGNOSIS — R7989 Other specified abnormal findings of blood chemistry: Secondary | ICD-10-CM | POA: Diagnosis present

## 2017-09-04 DIAGNOSIS — Z79891 Long term (current) use of opiate analgesic: Secondary | ICD-10-CM

## 2017-09-04 DIAGNOSIS — I130 Hypertensive heart and chronic kidney disease with heart failure and stage 1 through stage 4 chronic kidney disease, or unspecified chronic kidney disease: Secondary | ICD-10-CM | POA: Diagnosis present

## 2017-09-04 DIAGNOSIS — A419 Sepsis, unspecified organism: Secondary | ICD-10-CM | POA: Diagnosis present

## 2017-09-04 DIAGNOSIS — D3501 Benign neoplasm of right adrenal gland: Secondary | ICD-10-CM | POA: Diagnosis present

## 2017-09-04 DIAGNOSIS — I5030 Unspecified diastolic (congestive) heart failure: Secondary | ICD-10-CM | POA: Diagnosis present

## 2017-09-04 DIAGNOSIS — R748 Abnormal levels of other serum enzymes: Secondary | ICD-10-CM

## 2017-09-04 DIAGNOSIS — A408 Other streptococcal sepsis: Secondary | ICD-10-CM | POA: Diagnosis not present

## 2017-09-04 DIAGNOSIS — G8929 Other chronic pain: Secondary | ICD-10-CM | POA: Diagnosis present

## 2017-09-04 DIAGNOSIS — Z6827 Body mass index (BMI) 27.0-27.9, adult: Secondary | ICD-10-CM

## 2017-09-04 DIAGNOSIS — Z09 Encounter for follow-up examination after completed treatment for conditions other than malignant neoplasm: Secondary | ICD-10-CM

## 2017-09-04 HISTORY — DX: Pleural effusion, not elsewhere classified: J90

## 2017-09-04 LAB — DIFFERENTIAL
Basophils Absolute: 0 10*3/uL (ref 0.0–0.1)
Basophils Relative: 0 %
Eosinophils Absolute: 0 10*3/uL (ref 0.0–0.7)
Eosinophils Relative: 0 %
Lymphocytes Relative: 4 %
Lymphs Abs: 0.7 10*3/uL (ref 0.7–4.0)
Monocytes Absolute: 1.2 10*3/uL — ABNORMAL HIGH (ref 0.1–1.0)
Monocytes Relative: 6 %
Neutro Abs: 17.2 10*3/uL — ABNORMAL HIGH (ref 1.7–7.7)
Neutrophils Relative %: 90 %

## 2017-09-04 LAB — BASIC METABOLIC PANEL
Anion gap: 10 (ref 5–15)
BUN: 38 mg/dL — ABNORMAL HIGH (ref 6–20)
CO2: 25 mmol/L (ref 22–32)
Calcium: 8.5 mg/dL — ABNORMAL LOW (ref 8.9–10.3)
Chloride: 94 mmol/L — ABNORMAL LOW (ref 101–111)
Creatinine, Ser: 1.68 mg/dL — ABNORMAL HIGH (ref 0.61–1.24)
GFR calc Af Amer: 42 mL/min — ABNORMAL LOW (ref >60)
GFR calc non Af Amer: 36 mL/min — ABNORMAL LOW (ref >60)
Glucose, Bld: 170 mg/dL — ABNORMAL HIGH (ref 65–99)
Potassium: 3.7 mmol/L (ref 3.5–5.1)
Sodium: 129 mmol/L — ABNORMAL LOW (ref 135–145)

## 2017-09-04 LAB — CBC
HCT: 32.3 % — ABNORMAL LOW (ref 39.0–52.0)
Hemoglobin: 10.6 g/dL — ABNORMAL LOW (ref 13.0–17.0)
MCH: 30.7 pg (ref 26.0–34.0)
MCHC: 32.8 g/dL (ref 30.0–36.0)
MCV: 93.6 fL (ref 78.0–100.0)
Platelets: 313 10*3/uL (ref 150–400)
RBC: 3.45 MIL/uL — ABNORMAL LOW (ref 4.22–5.81)
RDW: 14.5 % (ref 11.5–15.5)
WBC: 18.8 10*3/uL — ABNORMAL HIGH (ref 4.0–10.5)

## 2017-09-04 LAB — URINALYSIS, ROUTINE W REFLEX MICROSCOPIC
Bilirubin Urine: NEGATIVE
Glucose, UA: NEGATIVE mg/dL
Hgb urine dipstick: NEGATIVE
Ketones, ur: NEGATIVE mg/dL
Leukocytes, UA: NEGATIVE
Nitrite: NEGATIVE
Protein, ur: NEGATIVE mg/dL
Specific Gravity, Urine: 1.015 (ref 1.005–1.030)
pH: 6 (ref 5.0–8.0)

## 2017-09-04 LAB — LACTIC ACID, PLASMA: Lactic Acid, Venous: 0.8 mmol/L (ref 0.5–1.9)

## 2017-09-04 LAB — PROCALCITONIN: Procalcitonin: 0.5 ng/mL

## 2017-09-04 LAB — TROPONIN I: Troponin I: 0.05 ng/mL (ref <0.03)

## 2017-09-04 MED ORDER — DOCUSATE SODIUM 100 MG PO CAPS
600.0000 mg | ORAL_CAPSULE | Freq: Every day | ORAL | Status: DC
  Administered 2017-09-04 – 2017-09-05 (×2): 600 mg via ORAL
  Filled 2017-09-04 (×2): qty 6

## 2017-09-04 MED ORDER — SIMVASTATIN 40 MG PO TABS
40.0000 mg | ORAL_TABLET | Freq: Every evening | ORAL | Status: DC
  Administered 2017-09-04 – 2017-09-13 (×9): 40 mg via ORAL
  Filled 2017-09-04 (×10): qty 1

## 2017-09-04 MED ORDER — ENOXAPARIN SODIUM 40 MG/0.4ML ~~LOC~~ SOLN
40.0000 mg | SUBCUTANEOUS | Status: DC
  Filled 2017-09-04: qty 0.4

## 2017-09-04 MED ORDER — IPRATROPIUM-ALBUTEROL 0.5-2.5 (3) MG/3ML IN SOLN: 3.0000 mL | Freq: Four times a day (QID) | RESPIRATORY_TRACT | Status: DC | PRN

## 2017-09-04 MED ORDER — HYDROMORPHONE HCL 2 MG PO TABS
2.0000 mg | ORAL_TABLET | Freq: Four times a day (QID) | ORAL | Status: DC | PRN
  Administered 2017-09-04 – 2017-09-08 (×12): 2 mg via ORAL
  Filled 2017-09-04 (×12): qty 1

## 2017-09-04 MED ORDER — SODIUM CHLORIDE 0.9 % IV SOLN
INTRAVENOUS | Status: DC
  Administered 2017-09-04 – 2017-09-07 (×4): via INTRAVENOUS

## 2017-09-04 MED ORDER — NORTRIPTYLINE HCL 25 MG PO CAPS
25.0000 mg | ORAL_CAPSULE | Freq: Every day | ORAL | Status: DC
  Administered 2017-09-04 – 2017-09-13 (×10): 25 mg via ORAL
  Filled 2017-09-04 (×10): qty 1

## 2017-09-04 MED ORDER — VANCOMYCIN HCL IN DEXTROSE 1-5 GM/200ML-% IV SOLN: 1000.0000 mg | Freq: Once | INTRAVENOUS | Status: DC

## 2017-09-04 MED ORDER — PIPERACILLIN-TAZOBACTAM 3.375 G IVPB 30 MIN
3.3750 g | Freq: Once | INTRAVENOUS | Status: AC
  Administered 2017-09-05: 3.375 g via INTRAVENOUS
  Filled 2017-09-04: qty 50

## 2017-09-04 MED ORDER — ACETAMINOPHEN 325 MG PO TABS
650.0000 mg | ORAL_TABLET | Freq: Four times a day (QID) | ORAL | Status: DC | PRN
  Administered 2017-09-04 – 2017-09-06 (×3): 650 mg via ORAL
  Filled 2017-09-04 (×3): qty 2

## 2017-09-04 MED ORDER — FINASTERIDE 5 MG PO TABS
5.0000 mg | ORAL_TABLET | Freq: Every day | ORAL | Status: DC
  Administered 2017-09-04 – 2017-09-08 (×5): 5 mg via ORAL
  Filled 2017-09-04 (×5): qty 1

## 2017-09-04 MED ORDER — ACETAMINOPHEN 650 MG RE SUPP: 650.0000 mg | Freq: Four times a day (QID) | RECTAL | Status: DC | PRN

## 2017-09-04 MED ORDER — VANCOMYCIN HCL 10 G IV SOLR
1250.0000 mg | INTRAVENOUS | Status: DC
  Administered 2017-09-05 (×2): 1250 mg via INTRAVENOUS
  Filled 2017-09-04 (×2): qty 1250

## 2017-09-04 MED ORDER — SODIUM CHLORIDE 0.9 % IV BOLUS (SEPSIS)
500.0000 mL | Freq: Once | INTRAVENOUS | Status: AC
  Administered 2017-09-04: 500 mL via INTRAVENOUS

## 2017-09-04 MED ORDER — PIPERACILLIN-TAZOBACTAM 3.375 G IVPB
3.3750 g | Freq: Three times a day (TID) | INTRAVENOUS | Status: DC
  Administered 2017-09-05 – 2017-09-13 (×25): 3.375 g via INTRAVENOUS
  Filled 2017-09-04 (×26): qty 50

## 2017-09-04 MED ORDER — TIZANIDINE HCL 2 MG PO TABS
2.0000 mg | ORAL_TABLET | Freq: Three times a day (TID) | ORAL | Status: DC | PRN
  Administered 2017-09-04 – 2017-09-13 (×10): 2 mg via ORAL
  Filled 2017-09-04 (×14): qty 1

## 2017-09-04 MED ORDER — ONDANSETRON HCL 4 MG/2ML IJ SOLN: 4.0000 mg | Freq: Four times a day (QID) | INTRAMUSCULAR | Status: DC | PRN

## 2017-09-04 MED ORDER — PREGABALIN 75 MG PO CAPS
150.0000 mg | ORAL_CAPSULE | Freq: Two times a day (BID) | ORAL | Status: DC
  Administered 2017-09-04 – 2017-09-14 (×19): 150 mg via ORAL
  Filled 2017-09-04 (×19): qty 2

## 2017-09-04 MED ORDER — SENNA 8.6 MG PO TABS
2.0000 | ORAL_TABLET | Freq: Every day | ORAL | Status: DC
  Administered 2017-09-04 – 2017-09-08 (×5): 17.2 mg via ORAL
  Filled 2017-09-04 (×5): qty 2

## 2017-09-04 MED ORDER — ONDANSETRON HCL 4 MG PO TABS: 4.0000 mg | ORAL_TABLET | Freq: Four times a day (QID) | ORAL | Status: DC | PRN

## 2017-09-04 NOTE — ED Triage Notes (Signed)
C/o weakness x weeks-worse x 2-3 days-pt was to have "procedure on his lungs today but it was cancelled because of the weather"-pt presents to triage in w/c-NAD

## 2017-09-04 NOTE — ED Notes (Signed)
Attempted to call report x 1  

## 2017-09-04 NOTE — ED Notes (Signed)
Pt reports fatigue and sleeping all of the time x 3 days, decrease appetite. Denies N/V/D, fever, cough.

## 2017-09-04 NOTE — Progress Notes (Signed)
Patient admitted from [facility] at [phone]. Patient settled, alert and oriented x 4, and shown how to use call bell and phone. IV in [place]. Patient has hearing aids. Waiting on orders from MD. Will continue to monitor.

## 2017-09-04 NOTE — Progress Notes (Signed)
From [facility] - Has recently been diagnosed with a parapneumonic effusion on the left side.  Had thoracentesis that did minimal drainage.  Has seen Dr. Roxan Hockey and was going to have bronchoscopy and drainage 09/04/17, but was canceled due to weather. Admit to Tele bed, please consult pulmonology

## 2017-09-04 NOTE — Progress Notes (Signed)
Pharmacy Antibiotic Note  Aaron Schmidt is a 83 y.o. male admitted on 09/04/2017 with sepsis.  Pharmacy has been consulted for Vancomycin / Zosyn dosing.  Plan: Zosyn 3.375 grams iv x 1 dose now over 30 minutes Zosyn 3.375 grams iv Q 8 hours - 4 hr infusion starting at 6 am Vancomycin 1250 mg iv Q 24 hours  Follow up Scr, cultures, progress  Height: 5\' 10"  (177.8 cm) Weight: 185 lb 10 oz (84.2 kg) IBW/kg (Calculated) : 73  Temp (24hrs), Avg:99.2 F (37.3 C), Min:98 F (36.7 C), Max:101.2 F (38.4 C)  Recent Labs  Lab 09/04/17 1349  WBC 18.8*  CREATININE 1.68*    Estimated Creatinine Clearance: 35 mL/min (A) (by C-G formula based on SCr of 1.68 mg/dL (H)).    Allergies  Allergen Reactions  . Diazepam Other (See Comments)    "makes goofy"     Thank you for allowing pharmacy to be a part of this patient's care. Anette Guarneri, PharmD [phone] 09/04/2017 9:53 PM

## 2017-09-04 NOTE — ED Provider Notes (Addendum)
[facility] EMERGENCY DEPARTMENT Provider Note   CSN: [id number] Arrival date & time: 09/04/17  1207     History   Chief Complaint Chief Complaint  Patient presents with  . Weakness    HPI Aaron Schmidt is a 83 y.o. male.  HPI Patient presents with generalized fatigue and increased sleepiness.  Has had it for the last 3 days.  Has recently been diagnosed with a parapneumonic effusion on the left side.  Had thoracentesis that did minimal drainage.  Has seen Dr. Roxan Hockey and was going to have bronchoscopy and drainage today but was canceled due to weather.  Over the last few days more week with less energy.  Decreased appetite.  Decreased oral intake.  Reportedly been able to drink some fluids but very little food intake.  No dysuria.  No diarrhea or constipation but states he has had less bowel movements.  No fever.  Has not really been having chest pain.  No cough. Past Medical History:  Diagnosis Date  . Arthritis    Degeneration spine & stenosis  . History of kidney stones   . Hypertension    has a histroy of  . Hypotension   . Sleep apnea 2012   used CPAP 2 yrs. ago, feels he sleeps better w/o, no longer using     Patient Active Problem List   Diagnosis Date Noted  . Pleural effusion on left 08/19/2017  . Sinusitis, chronic 08/19/2017  . Murmur 01/08/2017  . Skin ulcer of toe of left foot, limited to breakdown of skin ([place]) 09/04/2016  . Cervical myelopathy with cervical radiculopathy 06/24/2014  . LBBB (left bundle branch block) 06/21/2014  . Hyperlipidemia 06/21/2014  . Preoperative cardiovascular examination 06/21/2014  . OBSTRUCTIVE SLEEP APNEA 09/30/2010  . Essential hypertension 08/29/2010    Past Surgical History:  Procedure Laterality Date  . ARM NEUROPLASTY     at 14 yrs. of age- fell off tractor- had fracture & repair *& later- 16's had  transplantation of a nerve at the elbow  . BACK SURGERY     x4 back surgery x2 fusion -  . CARPAL  TUNNEL RELEASE Right   . COLONOSCOPY    . POSTERIOR CERVICAL FUSION/FORAMINOTOMY N/A 06/24/2014   Procedure: Posterior Cervical Three-Seven Fusion with Lateral Mass Fixation;  Surgeon: Erline Levine, MD;  Location: [facility];  Service: Neurosurgery;  Laterality: N/A;  C3-C7 posterior cervical fusion with lateral mass fixation  . SHOULDER SURGERY Bilateral        Home Medications    Prior to Admission medications   Medication Sig Start Date End Date Taking? Authorizing Provider  aspirin EC 81 MG tablet Take 81 mg by mouth daily.    [provider]  b complex vitamins tablet Take 1 tablet by mouth daily.      [provider]  Cholecalciferol (VITAMIN D) 2000 UNITS CAPS Take 2,000 Units by mouth daily.     [provider]  clobetasol (TEMOVATE) 0.05 % external solution Apply 1 application topically 2 (two) times daily.    [provider]  docusate sodium (COLACE) 100 MG capsule Take 600 mg by mouth daily.    [provider]  finasteride (PROSCAR) 5 MG tablet Take 5 mg by mouth daily.    [provider]  Flaxseed, Linseed, (FLAX SEED OIL) 1000 MG CAPS Take 1,000 mg by mouth daily.     [provider]  fluocinonide cream (LIDEX) 6.96 % Apply 1 application topically daily as  needed (itching).    [provider]  HYDROmorphone (DILAUDID) 2 MG tablet Take 2 mg by mouth 4 (four) times daily as needed for severe pain.    [provider]  L-Lysine 500 MG CAPS Take 500 mg by mouth daily.     [provider]  mineral oil liquid Take 60 mLs by mouth daily at 3 pm.    [provider]  Multiple Vitamin (MULTIVITAMIN) capsule Take 1 capsule by mouth daily.      [provider]  nortriptyline (PAMELOR) 25 MG capsule Take 25 mg by mouth at bedtime.     [provider]  pregabalin (LYRICA) 150 MG capsule Take 150 mg by mouth 2 (two) times daily.    [provider]  pyridOXINE  (VITAMIN B-6) 100 MG tablet Take 100 mg by mouth daily.      [provider]  saw palmetto 80 MG capsule Take 240 mg by mouth daily.     [provider]  Senna-Psyllium (PERDIEM PO) Take 3 tablets by mouth 2 (two) times daily.     [provider]  simvastatin (ZOCOR) 40 MG tablet Take 40 mg by mouth every evening.     [provider]  tiZANidine (ZANAFLEX) 2 MG tablet Take 2 mg by mouth 3 (three) times daily as needed for muscle spasms.    [provider]  triamterene-hydrochlorothiazide (MAXZIDE-25) 37.5-25 MG per tablet Take 1 tablet by mouth daily.    [provider]  vitamin C (ASCORBIC ACID) 500 MG tablet Take 500 mg by mouth 2 (two) times daily.    [provider]    Family History Family History  Problem Relation Age of Onset  . Pancreatic cancer Mother     Social History Social History   Tobacco Use  . Smoking status: Former Smoker    Years: 24.00    Types: Pipe    Last attempt to quit: 09/24/1978    Years since quitting: 38.9  . Smokeless tobacco: Never Used  Substance Use Topics  . Alcohol use: Yes    Comment: 3 drinks/day - gin or vodka   . Drug use: No     Allergies   Diazepam   Review of Systems Review of Systems  Constitutional: Positive for appetite change and fatigue. Negative for fever.  HENT: Negative for congestion.   Respiratory: Positive for shortness of breath. Negative for cough.   Cardiovascular: Negative for chest pain.  Gastrointestinal: Negative for abdominal pain and constipation.  Genitourinary: Negative for dysuria.  Musculoskeletal: Negative for back pain.  Neurological: Positive for weakness. Negative for headaches.  Hematological: Negative for adenopathy.  Psychiatric/Behavioral: Negative for confusion.     Physical Exam Updated Vital Signs BP (!) 121/52   Pulse 99   Temp 98.3 F (36.8 C) (Oral)   Resp 18   Ht 5\' 10"  (1.778 m)   Wt 79.8 kg (176 lb)   SpO2 98%    BMI 25.25 kg/m   Physical Exam  Constitutional: He appears well-developed.  HENT:  Head: Normocephalic.  Eyes: Pupils are equal, round, and reactive to light.  Neck: Neck supple.  Cardiovascular: Normal rate.  Murmur heard. Systolic murmur  Pulmonary/Chest: Effort normal.  Decreased breath sounds at left base  Abdominal: There is no tenderness.  Musculoskeletal: He exhibits no edema.  Neurological: He is alert.  Skin: Skin is warm. Capillary refill takes less than 2 seconds.  Psychiatric: Thought content normal.     ED Treatments /  Results  Labs (all labs ordered are listed, but only abnormal results are displayed) Labs Reviewed  BASIC METABOLIC PANEL - Abnormal; Notable for the following components:      Result Value   Sodium 129 (*)    Chloride 94 (*)    Glucose, Bld 170 (*)    BUN 38 (*)    Creatinine, Ser 1.68 (*)    Calcium 8.5 (*)    GFR calc non Af Amer 36 (*)    GFR calc Af Amer 42 (*)    All other components within normal limits  CBC - Abnormal; Notable for the following components:   WBC 18.8 (*)    RBC 3.45 (*)    Hemoglobin 10.6 (*)    HCT 32.3 (*)    All other components within normal limits  TROPONIN I - Abnormal; Notable for the following components:   Troponin I 0.05 (*)    All other components within normal limits  DIFFERENTIAL - Abnormal; Notable for the following components:   Neutro Abs 17.2 (*)    Monocytes Absolute 1.2 (*)    All other components within normal limits  URINALYSIS, ROUTINE W REFLEX MICROSCOPIC    EKG  EKG Interpretation  Date/Time:  Wednesday September 04 2017 13:49:36 EST Ventricular Rate:  98 PR Interval:    QRS Duration: 169 QT Interval:  401 QTC Calculation: 512 R Axis:   -9 Text Interpretation:  Sinus rhythm Probable left atrial enlargement Left bundle branch block LBBB is wider than previous Confirmed by Davonna Belling [phone]) on 09/04/2017 1:57:39 PM       Radiology Dg Chest 2 View  Result Date:  09/04/2017 CLINICAL DATA:  Shortness of breath. EXAM: CHEST  2 VIEW COMPARISON:  Radiograph of August 20, 2017. FINDINGS: Stable cardiomediastinal silhouette. No pneumothorax is noted. Right lung is clear. Stable moderate size loculated left pleural effusion is noted with probable associated left basilar atelectasis or infiltrate. Bony thorax is unremarkable. IMPRESSION: Stable moderate-sized loculated left pleural effusion. Electronically Signed   By: Marijo Conception, M.D.   On: 09/04/2017 14:25    Procedures Procedures (including critical care time)  Medications Ordered in ED Medications  sodium chloride 0.9 % bolus 500 mL (500 mLs Intravenous New Bag/Given 09/04/17 1428)     Initial Impression / Assessment and Plan / ED Course  I have reviewed the triage vital signs and the nursing notes.  Pertinent labs & imaging results that were available during my care of the patient were reviewed by me and considered in my medical decision making (see chart for details).     Patient presented with generalized weakness.  Has some hyponatremia and likely some dehydration.  X-rays with stable effusion.  EKG is reassuring but troponin is mildly elevated.  With the overall clinical picture I think he would probably benefit from admission to the hospital, likely [facility] due to chance of cardiac procedure.  Urinalysis reassuring.  Has had no chest pain.  Will admit to hospitalist.  We will continue fluid hydration.  Final Clinical Impressions(s) / ED Diagnoses   Final diagnoses:  Generalized weakness  Hyponatremia  Elevated troponin    ED Discharge Orders    None       Davonna Belling, MD 09/04/17 1518    Davonna Belling, MD 09/04/17 1527

## 2017-09-04 NOTE — H&P (Addendum)
History and Physical    Aaron Schmidt HAL:[id number] DOB: Oct 02, 1932 DOA: 09/04/2017  Referring MD/NP/PA: Dr. Roxan Hockey PCP: Hulan Fess, MD  Patient coming from: [facility]   Chief Complaint: Weakness and shortness of breath   I have personally briefly reviewed patient's old medical records in [place]   HPI: Aaron Schmidt is a 83 y.o. male with medical history significant of HTN, HLD, diastolic dysfunction with last EF 60-65% with grade 1 dFx on 12/2016, chronic back pain, s/p multiple spinal surgeries, BPH, OSA; who presented with complaints of generalized weakness and shortness of breath. Initially, he complained of left thoracic back pain and had been found to have a left-sided loculated pleural effusion on 11/20.  He was referred to Dr. Melvyn Novas of pulmonology on 11/26, but thoracentesis only drained about 200 mL of fluid that revealed a significantly elevated LDH but negative cytology.  Subsequently, patient was sent to be evaluated by Dr. Gorden Harms of cardiothoracic surgery.  Patient reports adverse weather conditions cause them to cancel the planned video bronchoscopy, assisted thoracoscopy, and possible decortication today.  Taking a deep breath and seems to worsen pain.  With these symptoms he has had a minimally productive cough and runny nose.  Symptoms have been going on for multiple weeks.  Associated symptoms include reports having no appetite, very little p.o. intake, and constipation (chronic).  Despite  taking hydromorphone, Flexeril, Lyrica for pain never goes below a 5 out of 10 on the pain scale.   ED Course: On admission to the emergency department patient was seen to be febrile up to 101.2 F, and other vital signs within normal limits.  Labs revealed WBC 18.8, hemoglobin 10.6, sodium 129, potassium 3.7, chloride 94, CO2 25, BUN 38, creatinine 1.68, glucose 170, and troponins 0.05.  Chest x-ray showing a moderate size loculated left pleural effusion.  Patient was  given 500 mL of normal saline fluids.  Accepted by [facility] to a telemetry bed at [facility].   Review of Systems  Constitutional: Positive for malaise/fatigue. Negative for chills and fever.  HENT: Negative for ear discharge and nosebleeds.   Eyes: Negative for photophobia and pain.  Respiratory: Positive for cough and shortness of breath.   Cardiovascular: Positive for leg swelling (intermittant). Negative for chest pain.  Gastrointestinal: Positive for constipation. Negative for abdominal pain.  Genitourinary: Negative for dysuria, frequency and hematuria.  Musculoskeletal: Positive for back pain. Negative for falls.  Skin: Negative for itching and rash.  Neurological: Positive for weakness. Negative for focal weakness and loss of consciousness.  Psychiatric/Behavioral: Negative for substance abuse. The patient is not nervous/anxious.     Past Medical History:  Diagnosis Date  . Arthritis    Degeneration spine & stenosis  . History of kidney stones   . Hypertension    has a histroy of  . Hypotension   . Sleep apnea 2012   used CPAP 2 yrs. ago, feels he sleeps better w/o, no longer using     Past Surgical History:  Procedure Laterality Date  . ARM NEUROPLASTY     at 14 yrs. of age- fell off tractor- had fracture & repair *& later- 54's had  transplantation of a nerve at the elbow  . BACK SURGERY     x4 back surgery x2 fusion -  . CARPAL TUNNEL RELEASE Right   . COLONOSCOPY    . POSTERIOR CERVICAL FUSION/FORAMINOTOMY N/A 06/24/2014   Procedure: Posterior Cervical Three-Seven Fusion with Lateral Mass Fixation;  Surgeon: Broadus John  Vertell Limber, MD;  Location: [facility];  Service: Neurosurgery;  Laterality: N/A;  C3-C7 posterior cervical fusion with lateral mass fixation  . SHOULDER SURGERY Bilateral      reports that he quit smoking about 38 years ago. His smoking use included pipe. He quit after 24.00 years of use. he has never used smokeless tobacco. He reports that he drinks alcohol.  He reports that he does not use drugs.  Allergies  Allergen Reactions  . Diazepam Other (See Comments)    "makes goofy"    Family History  Problem Relation Age of Onset  . Pancreatic cancer Mother     Prior to Admission medications   Medication Sig Start Date End Date Taking? Authorizing Provider  aspirin EC 81 MG tablet Take 81 mg by mouth daily.    [provider]  b complex vitamins tablet Take 1 tablet by mouth daily.      [provider]  Cholecalciferol (VITAMIN D) 2000 UNITS CAPS Take 2,000 Units by mouth daily.     [provider]  clobetasol (TEMOVATE) 0.05 % external solution Apply 1 application topically 2 (two) times daily.    [provider]  docusate sodium (COLACE) 100 MG capsule Take 600 mg by mouth daily.    [provider]  finasteride (PROSCAR) 5 MG tablet Take 5 mg by mouth daily.    [provider]  Flaxseed, Linseed, (FLAX SEED OIL) 1000 MG CAPS Take 1,000 mg by mouth daily.     [provider]  fluocinonide cream (LIDEX) 8.93 % Apply 1 application topically daily as needed (itching).    [provider]  HYDROmorphone (DILAUDID) 2 MG tablet Take 2 mg by mouth 4 (four) times daily as needed for severe pain.    [provider]  L-Lysine 500 MG CAPS Take 500 mg by mouth daily.     [provider]  mineral oil liquid Take 60 mLs by mouth daily at 3 pm.    [provider]  Multiple Vitamin (MULTIVITAMIN) capsule Take 1 capsule by mouth daily.      [provider]  nortriptyline (PAMELOR) 25 MG capsule Take 25 mg by mouth at bedtime.     [provider]  pregabalin (LYRICA) 150 MG capsule Take 150 mg by mouth 2 (two) times daily.    [provider]  pyridOXINE (VITAMIN B-6) 100 MG tablet Take 100 mg by mouth daily.      [provider]  saw palmetto 80 MG capsule Take 240 mg by mouth daily.     [provider]  Senna-Psyllium  (PERDIEM PO) Take 3 tablets by mouth 2 (two) times daily.     [provider]  simvastatin (ZOCOR) 40 MG tablet Take 40 mg by mouth every evening.     [provider]  tiZANidine (ZANAFLEX) 2 MG tablet Take 2 mg by mouth 3 (three) times daily as needed for muscle spasms.    [provider]  triamterene-hydrochlorothiazide (MAXZIDE-25) 37.5-25 MG per tablet Take 1 tablet by mouth daily.    [provider]  vitamin C (ASCORBIC ACID) 500 MG tablet Take 500 mg by mouth 2 (two) times daily.    [provider]    Physical Exam:  Constitutional: Elderly male who appears to be discomfort Vitals:   09/04/17 1228 09/04/17 1430 09/04/17 1500 09/04/17 1600  BP: 109/85 (!) 120/52 (!) 121/52 117/68  Pulse: 97 95 99 95  Resp: 20 14 18  15  Temp: 98.3 F (36.8 C)     TempSrc: Oral     SpO2: 99% 96% 98% 95%  Weight:      Height:       Eyes: PERRL, lids and conjunctivae normal ENMT: Mucous membranes are moist. Posterior pharynx clear of any exudate or lesions. Neck: normal, supple, no masses, no thyromegaly Respiratory: Decrease aeration on the left lower lung field.  No wheezes or rhonchi appreciated. Cardiovascular: Regular rate and rhythm, positive systolic ejection murmur  no rubs / gallops. No extremity edema. 2+ pedal pulses. No carotid bruits.  Abdomen: no tenderness, no masses palpated. No hepatosplenomegaly. Bowel sounds positive.  Musculoskeletal: no clubbing / cyanosis.  Healed surgical scars from previous back surgeries with decreased overall range of motion of the thoracic and cervical spine. Skin: no rashes, lesions, ulcers. No induration  Neurologic: CN 2-12 grossly intact. Sensation intact, DTR normal. Strength 5/5 in all 4.   Psychiatric: Normal judgment and insight. Alert and oriented x 3. Normal mood.     Labs on Admission: I have personally reviewed following labs and imaging studies  CBC: Recent Labs  Lab 09/04/17 1349  WBC 18.8*   NEUTROABS 17.2*  HGB 10.6*  HCT 32.3*  MCV 93.6  PLT 478   Basic Metabolic Panel: Recent Labs  Lab 09/04/17 1349  NA 129*  K 3.7  CL 94*  CO2 25  GLUCOSE 170*  BUN 38*  CREATININE 1.68*  CALCIUM 8.5*   GFR: Estimated Creatinine Clearance: 35 mL/min (A) (by C-G formula based on SCr of 1.68 mg/dL (H)). Liver Function Tests: No results for input(s): AST, ALT, ALKPHOS, BILITOT, PROT, ALBUMIN in the last 168 hours. No results for input(s): LIPASE, AMYLASE in the last 168 hours. No results for input(s): AMMONIA in the last 168 hours. Coagulation Profile: No results for input(s): INR, PROTIME in the last 168 hours. Cardiac Enzymes: Recent Labs  Lab 09/04/17 1349  TROPONINI 0.05*   BNP (last 3 results) No results for input(s): PROBNP in the last 8760 hours. HbA1C: No results for input(s): HGBA1C in the last 72 hours. CBG: No results for input(s): GLUCAP in the last 168 hours. Lipid Profile: No results for input(s): CHOL, HDL, LDLCALC, TRIG, CHOLHDL, LDLDIRECT in the last 72 hours. Thyroid Function Tests: No results for input(s): TSH, T4TOTAL, FREET4, T3FREE, THYROIDAB in the last 72 hours. Anemia Panel: No results for input(s): VITAMINB12, FOLATE, FERRITIN, TIBC, IRON, RETICCTPCT in the last 72 hours. Urine analysis:    Component Value Date/Time   COLORURINE YELLOW 09/04/2017 [place] 09/04/2017 1445   LABSPEC 1.015 09/04/2017 1445   PHURINE 6.0 09/04/2017 1445   GLUCOSEU NEGATIVE 09/04/2017 1445   HGBUR NEGATIVE 09/04/2017 1445   BILIRUBINUR NEGATIVE 09/04/2017 1445   KETONESUR NEGATIVE 09/04/2017 1445   PROTEINUR NEGATIVE 09/04/2017 1445   UROBILINOGEN 0.2 10/20/2010 1625   NITRITE NEGATIVE 09/04/2017 1445   LEUKOCYTESUR NEGATIVE 09/04/2017 1445   Sepsis Labs: No results found for this or any previous visit (from the past 240 hour(s)).   Radiological Exams on Admission: Dg Chest 2 View  Result Date: 09/04/2017 CLINICAL DATA:  Shortness  of breath. EXAM: CHEST  2 VIEW COMPARISON:  Radiograph of August 20, 2017. FINDINGS: Stable cardiomediastinal silhouette. No pneumothorax is noted. Right lung is clear. Stable moderate size loculated left pleural effusion is noted with probable associated left basilar atelectasis or infiltrate. Bony thorax is unremarkable. IMPRESSION: Stable moderate-sized loculated left pleural effusion. Electronically Signed   By: Sabino Dick  Brooke Bonito, M.D.   On: 09/04/2017 14:25    EKG: Independently reviewed.  Sinus rhythm with LBBB  Assessment/Plan Pleural effusion on left: Unresolved.  Patient presented with complaints of worsening shortness of breath.  Noted to have left-sided pleural effusion on 11/20. Status post thoracentesis which revealed elevated LDH level, but cytology was negative.  Previously planned bronchoscopy for 11/12 was canceled.  Chest x-ray otherwise appears stable. - Admit to telemetry bed - Continuous pulse oximetry with nasal cannula oxygen as needed - DuoNeb's prn shortness of breath - Called consult to on-call Dr. Caffie Pinto of cardiothoracic surgery who will let Dr. Roxan Hockey know that the patient's admitted  SIRS: WBC elevated at 18.8 with fever up to 101.2 F on admission.  Suspect the above as possible cause of symptoms. - Sepsis protocol initiated - Follow-up blood cultures - Empiric antibiotics of Vancomycin and Zosyn - Follow-up lactic acid level  Acute kidney injury on chronic kidney disease: Patient's baseline creatinine previously noted to be around 1.45 on 11/20.  He presents with a creatinine of 1.68 with BUN noted to be 38.  Given history of poor po intake suspect dehydration as a likely cause. - IV fluids Normal Saline at 100 ml/hr as tolerated - Recheck BMP in a.m.  Elevated Troponin: Acute initial troponin elevated 0.05 on admission.   - Trend cardiac troponins  Normocytic normochromic anemia: Acute.  Hemoglobin noted to be 10.6 on admission.  Previously noted to be  11.6 last month.  Hyponatremia 2/2 Dehydration: Sodium 129 on admission with hypochloremia likely caused by dehydration and diuretic use. - IV fluids as seen above   Chronic pain,  chronic opioid use: Patient with long history of multiple back surgeries for which she has dealt with chronic back pain currently on multiple medications including Dilaudid, Lyrica, Flexeril - Continue home pain regimen  Essential hypertension - Hold Maxide due to dehydration  Hyperlipidemia - Continue simvastatin  BPH - Continue Proscar  DVT prophylaxis: SCD  Code Status: full  Family Communication: none Disposition Plan: TBD Consults called: Pulmonology  Admission status: Inpatient  Norval Morton MD [facility] Pager [phone]   If 7PM-7AM, please contact night-coverage [email or web address] Password [facility]  09/04/2017, 6:59 PM

## 2017-09-04 NOTE — ED Notes (Signed)
Date and time results received: 09/04/17 14:38 (use smartphrase ".now" to insert current time)  Test: trp Critical Value: 0.05  Name of Provider Notified: Alvino Chapel  Orders Received? Or Actions Taken?: no orders given

## 2017-09-05 DIAGNOSIS — N179 Acute kidney failure, unspecified: Secondary | ICD-10-CM

## 2017-09-05 DIAGNOSIS — D649 Anemia, unspecified: Secondary | ICD-10-CM | POA: Diagnosis present

## 2017-09-05 DIAGNOSIS — E871 Hypo-osmolality and hyponatremia: Secondary | ICD-10-CM | POA: Diagnosis present

## 2017-09-05 DIAGNOSIS — G8929 Other chronic pain: Secondary | ICD-10-CM

## 2017-09-05 DIAGNOSIS — R7989 Other specified abnormal findings of blood chemistry: Secondary | ICD-10-CM

## 2017-09-05 DIAGNOSIS — R778 Other specified abnormalities of plasma proteins: Secondary | ICD-10-CM | POA: Diagnosis present

## 2017-09-05 DIAGNOSIS — R6510 Systemic inflammatory response syndrome (SIRS) of non-infectious origin without acute organ dysfunction: Secondary | ICD-10-CM

## 2017-09-05 DIAGNOSIS — N189 Chronic kidney disease, unspecified: Secondary | ICD-10-CM

## 2017-09-05 DIAGNOSIS — J90 Pleural effusion, not elsewhere classified: Secondary | ICD-10-CM

## 2017-09-05 DIAGNOSIS — A419 Sepsis, unspecified organism: Principal | ICD-10-CM

## 2017-09-05 HISTORY — DX: Other chronic pain: G89.29

## 2017-09-05 HISTORY — DX: Hypo-osmolality and hyponatremia: E87.1

## 2017-09-05 HISTORY — DX: Other specified abnormalities of plasma proteins: R77.8

## 2017-09-05 HISTORY — DX: Systemic inflammatory response syndrome (sirs) of non-infectious origin without acute organ dysfunction: R65.10

## 2017-09-05 HISTORY — DX: Anemia, unspecified: D64.9

## 2017-09-05 HISTORY — DX: Other specified abnormal findings of blood chemistry: R79.89

## 2017-09-05 HISTORY — DX: Chronic kidney disease, unspecified: N17.9

## 2017-09-05 LAB — CBC
HCT: 27.2 % — ABNORMAL LOW (ref 39.0–52.0)
Hemoglobin: 8.9 g/dL — ABNORMAL LOW (ref 13.0–17.0)
MCH: 30.2 pg (ref 26.0–34.0)
MCHC: 32.7 g/dL (ref 30.0–36.0)
MCV: 92.2 fL (ref 78.0–100.0)
Platelets: 280 10*3/uL (ref 150–400)
RBC: 2.95 MIL/uL — ABNORMAL LOW (ref 4.22–5.81)
RDW: 14.7 % (ref 11.5–15.5)
WBC: 15.2 10*3/uL — ABNORMAL HIGH (ref 4.0–10.5)

## 2017-09-05 LAB — COMPREHENSIVE METABOLIC PANEL
ALT: 76 U/L — ABNORMAL HIGH (ref 17–63)
AST: 89 U/L — ABNORMAL HIGH (ref 15–41)
Albumin: 2 g/dL — ABNORMAL LOW (ref 3.5–5.0)
Alkaline Phosphatase: 100 U/L (ref 38–126)
Anion gap: 8 (ref 5–15)
BUN: 30 mg/dL — ABNORMAL HIGH (ref 6–20)
CO2: 26 mmol/L (ref 22–32)
Calcium: 8.1 mg/dL — ABNORMAL LOW (ref 8.9–10.3)
Chloride: 98 mmol/L — ABNORMAL LOW (ref 101–111)
Creatinine, Ser: 1.37 mg/dL — ABNORMAL HIGH (ref 0.61–1.24)
GFR calc Af Amer: 54 mL/min — ABNORMAL LOW (ref >60)
GFR calc non Af Amer: 46 mL/min — ABNORMAL LOW (ref >60)
Glucose, Bld: 127 mg/dL — ABNORMAL HIGH (ref 65–99)
Potassium: 3.5 mmol/L (ref 3.5–5.1)
Sodium: 132 mmol/L — ABNORMAL LOW (ref 135–145)
Total Bilirubin: 0.6 mg/dL (ref 0.3–1.2)
Total Protein: 5.8 g/dL — ABNORMAL LOW (ref 6.5–8.1)

## 2017-09-05 LAB — LACTIC ACID, PLASMA: Lactic Acid, Venous: 0.7 mmol/L (ref 0.5–1.9)

## 2017-09-05 MED ORDER — ENSURE ENLIVE PO LIQD
237.0000 mL | Freq: Two times a day (BID) | ORAL | Status: DC
  Administered 2017-09-05 – 2017-09-11 (×8): 237 mL via ORAL

## 2017-09-05 MED ORDER — HEPARIN SODIUM (PORCINE) 5000 UNIT/ML IJ SOLN
5000.0000 [IU] | Freq: Three times a day (TID) | INTRAMUSCULAR | Status: DC
  Administered 2017-09-05 – 2017-09-08 (×10): 5000 [IU] via SUBCUTANEOUS
  Filled 2017-09-05 (×10): qty 1

## 2017-09-05 MED ORDER — BOOST / RESOURCE BREEZE PO LIQD CUSTOM
237.0000 mL | Freq: Three times a day (TID) | ORAL | Status: DC
  Administered 2017-09-06 – 2017-09-10 (×9): 1 via ORAL
  Administered 2017-09-10: 237 mL via ORAL

## 2017-09-05 MED ORDER — BISACODYL 10 MG RE SUPP
10.0000 mg | Freq: Once | RECTAL | Status: DC
  Filled 2017-09-05: qty 1

## 2017-09-05 NOTE — Progress Notes (Signed)
Initial Nutrition Assessment  DOCUMENTATION CODES:   Severe malnutrition in context of chronic illness  INTERVENTION:  1. Ensure Enlive po BID, each supplement provides 350 kcal and 20 grams of protein  NUTRITION DIAGNOSIS:   Severe Malnutrition related to chronic illness as evidenced by severe fat depletion, moderate fat depletion, moderate muscle depletion, percent weight loss.  GOAL:   Patient will meet greater than or equal to 90% of their needs  MONITOR:   PO intake, Supplement acceptance, I & O's, Labs, Weight trends  REASON FOR ASSESSMENT:   Malnutrition Screening Tool    ASSESSMENT:   Mr. Stan has a PMH of HTN, HLD, Diastolic Dysfunction EF 59-56%, presents with generalized weakness, SOB, left pleural effusion, dehydration, AKI on CKD  Spoke with patient at bedside. He reports for the past 3 weeks he hasn't been eating on average 1 meal per day. Normally eats a fried egg, orange juice, a banana, and coffee for breakfast, eats meat, veggies, and potatoes type meals for lunch and dinner. Reports a 12 pound/6% severe weight loss over the past 3 weeks , per chart exhibits a 10 pound/5.1% severe weight loss over 2.5 weeks. Normally drinks 1 ensure per day a thome, Agreed to consume here. Had grilled cheese, mashed potatoes and orange sherbert for lunch but states he was continually interrupted and his sandwich became cold. Ate ~50% Meal Completion: 50-100%  Labs reviewed:  Na 132 Medications reviewed and include:  Senokot NS at 168mL/hr    NUTRITION - FOCUSED PHYSICAL EXAM:    Most Recent Value  Orbital Region  Severe depletion  Upper Arm Region  Severe depletion  Thoracic and Lumbar Region  Severe depletion  Buccal Region  Severe depletion  Temple Region  Moderate depletion  Clavicle Bone Region  Moderate depletion  Clavicle and Acromion Bone Region  Moderate depletion  Scapular Bone Region  Moderate depletion  Dorsal Hand  Moderate depletion  Patellar  Region  Moderate depletion  Anterior Thigh Region  Moderate depletion  Posterior Calf Region  Moderate depletion  Hair  Reviewed  Eyes  Reviewed  Mouth  Reviewed  Skin  Reviewed  Nails  Reviewed       Diet Order:  Diet Heart Room service appropriate? Yes; Fluid consistency: Thin  EDUCATION NEEDS:   Education needs have been addressed  Skin:  Skin Assessment: Reviewed RN Assessment  Last BM:  09/03/2017  Height:   Ht Readings from Last 1 Encounters:  09/04/17 5\' 10"  (1.778 m)    Weight:   Wt Readings from Last 1 Encounters:  09/05/17 185 lb 10 oz (84.2 kg)    Ideal Body Weight:  75.45 kg  BMI:  Body mass index is 26.63 kg/m.  Estimated Nutritional Needs:   Kcal:  3875-6433 (MSJ x1.2-1.3)  Protein:  100-120 grams (1.2-1.4g/kg)  Fluid:  1.8-2.2L  Satira Anis. Gurney Balthazor, MS, RD LDN Inpatient Clinical Dietitian Pager [phone]

## 2017-09-05 NOTE — Progress Notes (Signed)
      [address]       [place] [address]             [phone]       Subjective: Aaron Schmidt is an 83 yo man with a past history of multiple spine surgeries, chronic pain, narcotic dependent, chronic constipation, multiple tooth abscesses, and possible chronic aspiration. He presented with left sided Cp and shortness of breath about 2 weeks ago. Was found to have a loculated pleural effusion. Thoracentesis showed an elevated LDH, cultures negative. Was scheduled for a bronch and VATS yesterday but he cancelled because of the weather. He then presented to [facility] last night feeling a little more short of breath and with left sided CP. WBC elevated and fever to 101.2. Transferred to [facility] for further management.  Today he complains of severe back pain ("ambulance ride killed me"). Not short of breath at rest. He "feels lousy". Poor appetite.   Objective: Vital signs in last 24 hours: Temp:  [97.7 F (36.5 C)-101.2 F (38.4 C)] 97.7 F (36.5 C) (12/13 0500) Pulse Rate:  [92-99] 92 (12/13 0500) Cardiac Rhythm: Heart block (12/13 0700) Resp:  [14-20] 17 (12/13 0500) BP: (109-125)/(52-85) 122/57 (12/13 0500) SpO2:  [95 %-100 %] 100 % (12/13 0500) Weight:  [176 lb (79.8 kg)-185 lb 10 oz (84.2 kg)] 185 lb 10 oz (84.2 kg) (12/13 0500)  Hemodynamic parameters for last 24 hours:    Intake/Output from previous day: 12/12 0701 - 12/13 0700 In: 1161.7 [P.O.:480; I.V.:181.7; IV Piggyback:500] Out: 425 [Urine:425] Intake/Output this shift: No intake/output data recorded.  General appearance: alert, cooperative and no distress Neurologic: intact Heart: regular rate and rhythm and + systolic murmur Lungs: diminished breath sounds left base  Lab Results: Recent Labs    09/04/17 1349 09/05/17 0227  WBC 18.8* 15.2*  HGB 10.6* 8.9*  HCT 32.3* 27.2*  PLT 313 280   BMET:  Recent Labs    09/04/17 1349 09/05/17 0227  NA 129* 132*  K 3.7 3.5  CL  94* 98*  CO2 25 26  GLUCOSE 170* 127*  BUN 38* 30*  CREATININE 1.68* 1.37*  CALCIUM 8.5* 8.1*    PT/INR: No results for input(s): LABPROT, INR in the last 72 hours. ABG No results found for: PHART, HCO3, TCO2, ACIDBASEDEF, O2SAT CBG (last 3)  No results for input(s): GLUCAP in the last 72 hours.  Assessment/Plan:  83 yo man with a loculated pleural effusion noted for the first time about 2 weeks ago. He was scheduled for a bronchoscopy and VATS, but he rescheduled to next week due to the weather.   He now presents with fevers and an elevated WBC. Has been started on broad spectrum antibiotics with vancomycin and Zosyn.  Severe protein calorie malnutrition. Albumin 2.0. Appetite poor- will order nutritional supplements  Deconditioning- will ask PT to see to assist with mobility.  Chronic kidney disease, stage 3- creatinine 1.68, GFR 36 on admission, down this AM with hydration  Will see if I can get him on the OR schedule for Monday afternoon.     LOS: 1 day    Aaron Schmidt 09/05/2017

## 2017-09-05 NOTE — Progress Notes (Signed)
Patient has fever 101.2 tonight.Text paged Forrest Moron NP.New orders received from Dr. Tamala Julian and carried out.Will continue to monitor patient.

## 2017-09-05 NOTE — Evaluation (Signed)
Physical Therapy Evaluation Patient Details Name: Aaron Schmidt MRN: [id number] DOB: 1933/06/21 Today's Date: 09/05/2017   History of Present Illness  Patient is a 83 y/o male who presents with generalized weakness, fatigue, SOB and sleepiness. Recently diagnosed with left pleural effusion and scheduled for VATs and bronch but canceled due to snow. Found to have hyponatremia, dehydration, fevers and elevated WBC. CXR-left pleural effusion. Plan for possible VATs and bronch on Monday 12/17. PMH includes sleep apnea, HTN.   Clinical Impression  Patient presents with generalized weakness, dyspnea on exertion, decreased activity tolerance, tachycardia and impaired mobility s/p above. Tolerated gait training with Min guard assist for balance/safety. Pt in irregular heart rhythm ranging from 80s-150 bpm and Sp02 87%-92% on RA. 2/4 DOE noted with mobility. Pt from home with wife and [employer] P[employer]. Encouraged increasing mobility while in hospital. Will follow acutely to maximize independence and mobility prior to return home.     Follow Up Recommendations Home health PT;Supervision for mobility/OOB    Equipment Recommendations  None recommended by PT    Recommendations for Other Services       Precautions / Restrictions Precautions Precautions: Fall Precaution Comments: watch HR Restrictions Weight Bearing Restrictions: No      Mobility  Bed Mobility Overal bed mobility: Needs Assistance Bed Mobility: Rolling;Sidelying to Sit;Sit to Supine Rolling: Min guard Sidelying to sit: Min guard;HOB elevated   Sit to supine: Supervision   General bed mobility comments: Reemphasized log roll technique as to with hx of back surgeries. Use of rail.   Transfers Overall transfer level: Needs assistance Equipment used: None Transfers: Sit to/from Stand Sit to Stand: Min guard         General transfer comment: Min guard for safety and to steady in standing.   Ambulation/Gait Ambulation/Gait  assistance: Min guard Ambulation Distance (Feet): 50 Feet Assistive device: Rolling walker (2 wheeled) Gait Pattern/deviations: Step-through pattern;Decreased stride length;Trunk flexed Gait velocity: decreased   General Gait Details: Slow, mildly unsteady gait with RW for support. Irregular rhythm ranging from 80-150 bpm. 2/4 DOE. Sp02 ranged frmo 87-92% on RA.   Stairs            Wheelchair Mobility    Modified Rankin (Stroke Patients Only)       Balance Overall balance assessment: Needs assistance Sitting-balance support: Feet supported;No upper extremity supported Sitting balance-Leahy Scale: Good     Standing balance support: During functional activity Standing balance-Leahy Scale: Fair Standing balance comment: Able to stand statically without UE support but requires UE support for dynamic standing.                              Pertinent Vitals/Pain Pain Assessment: No/denies pain    Home Living Family/patient expects to be discharged to:: Private residence Living Arrangements: Spouse/significant other Available Help at Discharge: Family;Available 24 hours/day Type of Home: House Home Access: Stairs to enter Entrance Stairs-Rails: Psychiatric nurse of Steps: 2-3 Home Layout: Multi-level Home Equipment: Cane - single point;Walker - 2 wheels;Grab bars - tub/shower      Prior Function Level of Independence: Independent         Comments: Uses cane PRN as needed.      Hand Dominance   Dominant Hand: Right    Extremity/Trunk Assessment   Upper Extremity Assessment Upper Extremity Assessment: Defer to OT evaluation    Lower Extremity Assessment Lower Extremity Assessment: Generalized weakness    Cervical / Trunk  Assessment Cervical / Trunk Assessment: Kyphotic  Communication   Communication: No difficulties  Cognition Arousal/Alertness: Awake/alert Behavior During Therapy: WFL for tasks assessed/performed Overall  Cognitive Status: Within Functional Limits for tasks assessed                                 General Comments: for basic mobility tasks.       General Comments General comments (skin integrity, edema, etc.): HR ranged from 80-150 bpm irregular rhythm and Sp02 ranged from 87-91% on RA.     Exercises     Assessment/Plan    PT Assessment Patient needs continued PT services  PT Problem List Decreased strength;Decreased mobility;Decreased balance;Decreased activity tolerance;Cardiopulmonary status limiting activity;Decreased knowledge of use of DME       PT Treatment Interventions DME instruction;Functional mobility training;Balance training;Patient/family education;Gait training;Therapeutic activities;Stair training;Therapeutic exercise    PT Goals (Current goals can be found in the Care Plan section)  Acute Rehab PT Goals Patient Stated Goal: to feel better PT Goal Formulation: With patient Time For Goal Achievement: 09/19/17 Potential to Achieve Goals: Good    Frequency Min 3X/week   Barriers to discharge Inaccessible home environment stairs to enter home/get to bedroom    Co-evaluation               AM-PAC PT "6 Clicks" Daily Activity  Outcome Measure Difficulty turning over in bed (including adjusting bedclothes, sheets and blankets)?: None Difficulty moving from lying on back to sitting on the side of the bed? : None Difficulty sitting down on and standing up from a chair with arms (e.g., wheelchair, bedside commode, etc,.)?: None Help needed moving to and from a bed to chair (including a wheelchair)?: A Little Help needed walking in hospital room?: A Little Help needed climbing 3-5 steps with a railing? : A Lot 6 Click Score: 20    End of Session Equipment Utilized During Treatment: Gait belt Activity Tolerance: Treatment limited secondary to medical complications (Comment)(HR and Sp02) Patient left: in bed;with call bell/phone within reach;with  bed alarm set Nurse Communication: Mobility status PT Visit Diagnosis: Unsteadiness on feet (R26.81);Muscle weakness (generalized) (M62.81);Difficulty in walking, not elsewhere classified (R26.2)    Time: 2831-5176 PT Time Calculation (min) (ACUTE ONLY): 23 min   Charges:   PT Evaluation $PT Eval Moderate Complexity: 1 Mod PT Treatments $Gait Training: 8-22 mins   PT G Codes:        Wray Kearns, PT, DPT [phone]    Aaron Schmidt 09/05/2017, 3:33 PM

## 2017-09-05 NOTE — Progress Notes (Signed)
PROGRESS NOTE  Aaron Schmidt:[id number] DOB: [birth year] DOA: 09/04/2017 PCP: Hulan Fess, MD  HPI/Recap of past 24 hours: Aaron Schmidt is a 83 y.o. male with medical history significant of HTN, HLD, diastolic dysfunction with last EF 60-65% with grade 1on 12/2016, chronic back pain, s/p multiple spinal surgeries, BPH, OSA; who presented with complaints of generalized weakness and shortness of breath. Initially, he complained of left thoracic back pain and had been found to have a left-sided loculated pleural effusion on 11/20.  He was referred to Dr. Melvyn Novas of pulmonology on 11/26, but thoracentesis only drained about 200 mL of fluid that revealed a significantly elevated LDH but negative cytology.  Subsequently, patient was sent to be evaluated by Dr. Gorden Harms of cardiothoracic surgery for planned video bronchoscopy, assisted thoracoscopy, and possible decortication. Missed appointment.On admission ED 101.2 F, WBC 18.8. Chest x-ray showing a moderate size loculated left pleural effusion.  No acute events reported overnight. Patient seen and examined at his bedside. Reports chronic lower back pain 10/10 after just taken his pain meds. Dull and non radiating.   Assessment/Plan: Principal Problem:   Pleural effusion, left Active Problems:   Essential hypertension   SIRS (systemic inflammatory response syndrome) (HCC)   Elevated troponin   Normochromic normocytic anemia   Hyponatremia   Acute kidney injury superimposed on chronic kidney disease (HCC)   Chronic pain  Left Pleural effusion on left:  -Unresolved.  Noted to have left-sided pleural effusion on 11/20.  -Status post thoracentesis which revealed elevated LDH level, but cytology was negative.   -Previously planned bronchoscopy for 11/12 was canceled.  - Continuous pulse oximetry with nasal cannula oxygen as needed - DuoNeb's prn shortness of breath  SIRS: WBC elevated at 18.8 with fever up to 101.2 F on admission.    -unclear etiology - Sepsis protocol initiated on admission - blood cultures x 2 in process - Empiric antibiotics of Vancomycin and Zosyn day #1 - Follow-up lactic acid 0.7 (09/05/17) from 0.8  Acute kidney injury on chronic kidney disease -baseline creatinine 1.3. cr 1.68 on presentation -improving cr 1.37 from 1.68 -avoid nephrotoxic agents/hypotension - IV fluids Normal Saline at 100 ml/hr as tolerated - BMP in a.m.  Normocytic normochromic anemia -hg 8.9 from 10.6 -no sign of overt bleeding -cbc am  Hyponatremia -unclear etiology- -improving Na+ 132 from 129 -BMP am   Chronic lower back pain,  chronic opioid use -multiple back surgeries -Dilaudid, Lyrica, Flexeril -Continue home pain regimen  Essential hypertension -BP is well controlled -continue home meds  Hyperlipidemia - Continue simvastatin  BPH - Continue Proscar    Code Status: stable   Family Communication: No family member at bedside  Disposition Plan: willl stay another midnight to continue current management   Consultants:  none  Procedures:  none  Antimicrobials:  IV vanc and zosyn day #1  DVT prophylaxis:  heparin 5000 u sq tid   Objective: Vitals:   09/04/17 1907 09/04/17 2115 09/04/17 2300 09/05/17 0500  BP: 125/62 (!) 122/55  (!) 122/57  Pulse: 97   92  Resp: 19 20  17   Temp: 98 F (36.7 C) (!) 101.2 F (38.4 C) 97.8 F (36.6 C) 97.7 F (36.5 C)  TempSrc: Oral Oral Oral Oral  SpO2:  98%  100%  Weight: 84.2 kg (185 lb 10 oz)   84.2 kg (185 lb 10 oz)  Height: 5\' 10"  (1.778 m)       Intake/Output Summary (Last 24 hours) at 09/05/2017 0747  Last data filed at 09/05/2017 8315 Gross per 24 hour  Intake 1161.67 ml  Output 425 ml  Net 736.67 ml   Filed Weights   09/04/17 1226 09/04/17 1907 09/05/17 0500  Weight: 79.8 kg (176 lb) 84.2 kg (185 lb 10 oz) 84.2 kg (185 lb 10 oz)    Exam:   General:  83 yo CM WD WN uncomfortable due to chronic lower back pain    Cardiovascular: RRR no rubs or gallops  Respiratory: no wheezes. Mild rales at bases bilaterally  Abdomen: soft NT ND NBS x4  Musculoskeletal: moves all 4 non focal  Skin: no noted open lesions  Psychiatry: Mood is appropriate    Data Reviewed: CBC: Recent Labs  Lab 09/04/17 1349 09/05/17 0227  WBC 18.8* 15.2*  NEUTROABS 17.2*  --   HGB 10.6* 8.9*  HCT 32.3* 27.2*  MCV 93.6 92.2  PLT 313 176   Basic Metabolic Panel: Recent Labs  Lab 09/04/17 1349 09/05/17 0227  NA 129* 132*  K 3.7 3.5  CL 94* 98*  CO2 25 26  GLUCOSE 170* 127*  BUN 38* 30*  CREATININE 1.68* 1.37*  CALCIUM 8.5* 8.1*   GFR: Estimated Creatinine Clearance: 42.9 mL/min (A) (by C-G formula based on SCr of 1.37 mg/dL (H)). Liver Function Tests: Recent Labs  Lab 09/05/17 0227  AST 89*  ALT 76*  ALKPHOS 100  BILITOT 0.6  PROT 5.8*  ALBUMIN 2.0*   No results for input(s): LIPASE, AMYLASE in the last 168 hours. No results for input(s): AMMONIA in the last 168 hours. Coagulation Profile: No results for input(s): INR, PROTIME in the last 168 hours. Cardiac Enzymes: Recent Labs  Lab 09/04/17 1349  TROPONINI 0.05*   BNP (last 3 results) No results for input(s): PROBNP in the last 8760 hours. HbA1C: No results for input(s): HGBA1C in the last 72 hours. CBG: No results for input(s): GLUCAP in the last 168 hours. Lipid Profile: No results for input(s): CHOL, HDL, LDLCALC, TRIG, CHOLHDL, LDLDIRECT in the last 72 hours. Thyroid Function Tests: No results for input(s): TSH, T4TOTAL, FREET4, T3FREE, THYROIDAB in the last 72 hours. Anemia Panel: No results for input(s): VITAMINB12, FOLATE, FERRITIN, TIBC, IRON, RETICCTPCT in the last 72 hours. Urine analysis:    Component Value Date/Time   COLORURINE YELLOW 09/04/2017 [place] 09/04/2017 1445   LABSPEC 1.015 09/04/2017 1445   PHURINE 6.0 09/04/2017 1445   GLUCOSEU NEGATIVE 09/04/2017 1445   HGBUR NEGATIVE 09/04/2017  1445   BILIRUBINUR NEGATIVE 09/04/2017 1445   KETONESUR NEGATIVE 09/04/2017 1445   PROTEINUR NEGATIVE 09/04/2017 1445   UROBILINOGEN 0.2 10/20/2010 1625   NITRITE NEGATIVE 09/04/2017 1445   LEUKOCYTESUR NEGATIVE 09/04/2017 1445   Sepsis Labs: @LABRCNTIP (procalcitonin:4,lacticidven:4)  )No results found for this or any previous visit (from the past 240 hour(s)).    Studies: Dg Chest 2 View  Result Date: 09/04/2017 CLINICAL DATA:  Shortness of breath. EXAM: CHEST  2 VIEW COMPARISON:  Radiograph of August 20, 2017. FINDINGS: Stable cardiomediastinal silhouette. No pneumothorax is noted. Right lung is clear. Stable moderate size loculated left pleural effusion is noted with probable associated left basilar atelectasis or infiltrate. Bony thorax is unremarkable. IMPRESSION: Stable moderate-sized loculated left pleural effusion. Electronically Signed   By: Marijo Conception, M.D.   On: 09/04/2017 14:25    Scheduled Meds: . docusate sodium  600 mg Oral Daily  . finasteride  5 mg Oral Daily  . nortriptyline  25 mg Oral QHS  . pregabalin  150 mg Oral BID  . senna  2 tablet Oral Daily  . simvastatin  40 mg Oral QPM    Continuous Infusions: . sodium chloride 100 mL/hr at 09/04/17 2111  . piperacillin-tazobactam (ZOSYN)  IV 3.375 g (09/05/17 0647)  . vancomycin Stopped (09/05/17 0152)     LOS: 1 day     Kayleen Memos, MD [facility] Pager [phone]  If 7PM-7AM, please contact night-coverage [email or web address] Password [facility] 09/05/2017, 7:47 AM

## 2017-09-06 ENCOUNTER — Encounter (HOSPITAL_COMMUNITY): Payer: Self-pay | Admitting: Thoracic Surgery (Cardiothoracic Vascular Surgery)

## 2017-09-06 ENCOUNTER — Other Ambulatory Visit (HOSPITAL_COMMUNITY): Payer: Self-pay | Admitting: Urology

## 2017-09-06 DIAGNOSIS — A408 Other streptococcal sepsis: Secondary | ICD-10-CM

## 2017-09-06 DIAGNOSIS — A403 Sepsis due to Streptococcus pneumoniae: Secondary | ICD-10-CM

## 2017-09-06 LAB — CBC
HCT: 30.1 % — ABNORMAL LOW (ref 39.0–52.0)
Hemoglobin: 9.6 g/dL — ABNORMAL LOW (ref 13.0–17.0)
MCH: 29.7 pg (ref 26.0–34.0)
MCHC: 31.9 g/dL (ref 30.0–36.0)
MCV: 93.2 fL (ref 78.0–100.0)
Platelets: 329 10*3/uL (ref 150–400)
RBC: 3.23 MIL/uL — ABNORMAL LOW (ref 4.22–5.81)
RDW: 14.6 % (ref 11.5–15.5)
WBC: 13.8 10*3/uL — ABNORMAL HIGH (ref 4.0–10.5)

## 2017-09-06 LAB — BASIC METABOLIC PANEL
Anion gap: 9 (ref 5–15)
BUN: 21 mg/dL — ABNORMAL HIGH (ref 6–20)
CO2: 27 mmol/L (ref 22–32)
Calcium: 8.3 mg/dL — ABNORMAL LOW (ref 8.9–10.3)
Chloride: 101 mmol/L (ref 101–111)
Creatinine, Ser: 1.23 mg/dL (ref 0.61–1.24)
GFR calc Af Amer: >60 mL/min (ref >60)
GFR calc non Af Amer: 53 mL/min — ABNORMAL LOW (ref >60)
Glucose, Bld: 116 mg/dL — ABNORMAL HIGH (ref 65–99)
Potassium: 3.2 mmol/L — ABNORMAL LOW (ref 3.5–5.1)
Sodium: 137 mmol/L (ref 135–145)

## 2017-09-06 MED ORDER — POTASSIUM CHLORIDE CRYS ER 20 MEQ PO TBCR
40.0000 meq | EXTENDED_RELEASE_TABLET | Freq: Once | ORAL | Status: AC
  Administered 2017-09-06: 40 meq via ORAL
  Filled 2017-09-06: qty 2

## 2017-09-06 MED ORDER — VANCOMYCIN HCL IN DEXTROSE 750-5 MG/150ML-% IV SOLN
750.0000 mg | Freq: Two times a day (BID) | INTRAVENOUS | Status: DC
  Administered 2017-09-06 – 2017-09-12 (×13): 750 mg via INTRAVENOUS
  Filled 2017-09-06 (×14): qty 150

## 2017-09-06 NOTE — Progress Notes (Signed)
SLP Cancellation Note  Patient Details Name: Aaron Schmidt MRN: [id number] DOB: 1933-05-30   Cancelled treatment:       Reason Eval/Treat Not Completed: Patient at procedure or test/unavailable. Pt currently with therapy. Will continue efforts. RN reports no observable deficits swallowing. Pt currently receiving regular consistency solids and thin liquids.  Celia B. Quentin Ore [facility], CCC-SLP Speech Language Pathologist [phone]  Shonna Chock 09/06/2017, 11:13 AM

## 2017-09-06 NOTE — Progress Notes (Addendum)
PROGRESS NOTE  Aaron Schmidt QQI:[id number] DOB: April 22, 1933 DOA: 09/04/2017 PCP: Hulan Fess, MD  HPI/Recap of past 24 hours: Aaron Schmidt is a 83 y.o. male with medical history significant of HTN, HLD, diastolic dysfunction with last EF 60-65% with grade 1on 12/2016, chronic back pain, s/p multiple spinal surgeries, BPH, OSA; who presented with complaints of generalized weakness and shortness of breath. Initially, he complained of left thoracic back pain and had been found to have a left-sided loculated pleural effusion on 11/20.  He was referred to Dr. Melvyn Novas of pulmonology on 11/26, but thoracentesis only drained about 200 mL of fluid that revealed a significantly elevated LDH but negative cytology.  Subsequently, patient was sent to be evaluated by Dr. Gorden Harms of cardiothoracic surgery for planned video bronchoscopy, assisted thoracoscopy, and possible decortication. Missed appointment.On admission ED 101.2 F, WBC 18.8. Chest x-ray showing a moderate size loculated left pleural effusion.  No acute events reported overnight. Pt seen and examined at his bedside. He reports he feels terrible but would not elaborate. Denies chest pain or dyspnea.    Assessment/Plan: Principal Problem:   Pleural effusion, left Active Problems:   Essential hypertension   SIRS (systemic inflammatory response syndrome) (HCC)   Elevated troponin   Normochromic normocytic anemia   Hyponatremia   Acute kidney injury superimposed on chronic kidney disease (HCC)   Chronic pain  Left Pleural effusion, present on admission:  -Unresolved.  Noted to have left-sided pleural effusion on 11/20.  -Status post left thoracentesis with 220 cc hazy yellow fluid removed which revealed elevated LDH level, but cytology was negative 08/20/17.   -Previously planned bronchoscopy for 11/12 was canceled.  - Continuous pulse oximetry with nasal cannula oxygen as needed - DuoNeb's prn shortness of breath -cardiothoracic  surgery following and plans for possible VATS on Monday 09/09/17  Sepsis 2/2 to suspected HCAP, poa -cxr 09/04/17 revealed left pleural effusion and RML infiltrates -possible HCAP -swallow eval by speech mild aspiration risk- Regular thin liquids -on admission: WBC elevated at 18.8 with fever up to 101.2 F.  - Sepsis protocol initiated on admission - blood cultures x 2 no growth 1 day - IV Vancomycin and Zosyn day #2 - lactic acid 0.7 (09/05/17) from 0.8 - afebrile, wbc trending down 13k (09/06/17) from 15k  Acute kidney injury on chronic kidney disease -resolving -baseline creatinine 1.2. cr 1.68 on presentation -cr 1.23 from 1.37 from 1.68 -avoid nephrotoxic agents/hypotension - IV fluids Normal Saline reduced to 50 ml/hr as tolerated - BMP in a.m.  Hypokalemia -K+ 3.2 -replete as indicated -BMP am  Chronic normocytic normochromic anemia -hg 9.6 from 8.9 from 10.6 -baseline hg 11 -no sign of overt bleeding -cbc am  Hyponatremia, resolved -unclear etiology- - Na+ 137 from 132 from 129 -BMP am   Chronic lower back pain,  chronic opioid use -multiple back surgeries -Dilaudid, Lyrica, Flexeril -Continue home pain regimen  Essential hypertension -BP is stable -hold hctz due to AKI  Hyperlipidemia - Continue simvastatin  BPH -stable -Continue Proscar    Code Status: stable   Family Communication: No family member at bedside  Disposition Plan: willl stay another midnight to continue current management. Possible VATS ion Monday 09/09/17 by CTS.   Consultants:  Cardiothoracic surgery  Procedures:  none  Antimicrobials:  IV vanc and zosyn day #2  DVT prophylaxis:  heparin 5000 u sq tid   Objective: Vitals:   09/05/17 0500 09/05/17 1511 09/05/17 2116 09/06/17 0520  BP: (!) 122/57 (!) 103/48 Marland Kitchen)  92/48 133/60  Pulse: 92 89 84 93  Resp: 17 16 18 18   Temp: 97.7 F (36.5 C) 97.6 F (36.4 C) 98.1 F (36.7 C) 99.2 F (37.3 C)  TempSrc:  Oral Oral Oral Oral  SpO2: 100% 98% 95% 98%  Weight: 84.2 kg (185 lb 10 oz)     Height:        Intake/Output Summary (Last 24 hours) at 09/06/2017 0751 Last data filed at 09/06/2017 0529 Gross per 24 hour  Intake 2400 ml  Output 1210 ml  Net 1190 ml   Filed Weights   09/04/17 1226 09/04/17 1907 09/05/17 0500  Weight: 79.8 kg (176 lb) 84.2 kg (185 lb 10 oz) 84.2 kg (185 lb 10 oz)    Exam:   General:  83 yo CM WD WN uncomfortable due to chronic lower back pain   Cardiovascular: RRR no rubs or gallops  Respiratory: no wheezes. Mild rales at bases bilaterally  Abdomen: soft NT ND NBS x4  Musculoskeletal: moves all 4 non focal  Skin: no noted open lesions  Psychiatry: Mood is appropriate    Data Reviewed: CBC: Recent Labs  Lab 09/04/17 1349 09/05/17 0227 09/06/17 0354  WBC 18.8* 15.2* 13.8*  NEUTROABS 17.2*  --   --   HGB 10.6* 8.9* 9.6*  HCT 32.3* 27.2* 30.1*  MCV 93.6 92.2 93.2  PLT 313 280 144   Basic Metabolic Panel: Recent Labs  Lab 09/04/17 1349 09/05/17 0227 09/06/17 0354  NA 129* 132* 137  K 3.7 3.5 3.2*  CL 94* 98* 101  CO2 25 26 27   GLUCOSE 170* 127* 116*  BUN 38* 30* 21*  CREATININE 1.68* 1.37* 1.23  CALCIUM 8.5* 8.1* 8.3*   GFR: Estimated Creatinine Clearance: 47.8 mL/min (by C-G formula based on SCr of 1.23 mg/dL). Liver Function Tests: Recent Labs  Lab 09/05/17 0227  AST 89*  ALT 76*  ALKPHOS 100  BILITOT 0.6  PROT 5.8*  ALBUMIN 2.0*   No results for input(s): LIPASE, AMYLASE in the last 168 hours. No results for input(s): AMMONIA in the last 168 hours. Coagulation Profile: No results for input(s): INR, PROTIME in the last 168 hours. Cardiac Enzymes: Recent Labs  Lab 09/04/17 1349  TROPONINI 0.05*   BNP (last 3 results) No results for input(s): PROBNP in the last 8760 hours. HbA1C: No results for input(s): HGBA1C in the last 72 hours. CBG: No results for input(s): GLUCAP in the last 168 hours. Lipid Profile: No  results for input(s): CHOL, HDL, LDLCALC, TRIG, CHOLHDL, LDLDIRECT in the last 72 hours. Thyroid Function Tests: No results for input(s): TSH, T4TOTAL, FREET4, T3FREE, THYROIDAB in the last 72 hours. Anemia Panel: No results for input(s): VITAMINB12, FOLATE, FERRITIN, TIBC, IRON, RETICCTPCT in the last 72 hours. Urine analysis:    Component Value Date/Time   COLORURINE YELLOW 09/04/2017 [place] 09/04/2017 1445   LABSPEC 1.015 09/04/2017 1445   PHURINE 6.0 09/04/2017 1445   GLUCOSEU NEGATIVE 09/04/2017 1445   HGBUR NEGATIVE 09/04/2017 1445   BILIRUBINUR NEGATIVE 09/04/2017 1445   KETONESUR NEGATIVE 09/04/2017 1445   PROTEINUR NEGATIVE 09/04/2017 1445   UROBILINOGEN 0.2 10/20/2010 1625   NITRITE NEGATIVE 09/04/2017 1445   LEUKOCYTESUR NEGATIVE 09/04/2017 1445   Sepsis Labs: @LABRCNTIP (procalcitonin:4,lacticidven:4)  )No results found for this or any previous visit (from the past 240 hour(s)).    Studies: No results found.  Scheduled Meds: . bisacodyl  10 mg Rectal Once  . feeding supplement  237 mL Oral TID WC  .  feeding supplement (ENSURE ENLIVE)  237 mL Oral BID BM  . finasteride  5 mg Oral Daily  . heparin injection (subcutaneous)  5,000 Units Subcutaneous Q8H  . nortriptyline  25 mg Oral QHS  . pregabalin  150 mg Oral BID  . senna  2 tablet Oral Daily  . simvastatin  40 mg Oral QPM    Continuous Infusions: . sodium chloride 100 mL/hr at 09/05/17 2139  . piperacillin-tazobactam (ZOSYN)  IV 3.375 g (09/06/17 0529)  . vancomycin Stopped (09/06/17 0220)     LOS: 2 days     Kayleen Memos, MD [facility] Pager [phone]  If 7PM-7AM, please contact night-coverage [email or web address] Password [email or web address] 09/06/2017, 7:51 AM

## 2017-09-06 NOTE — Progress Notes (Signed)
Pharmacy Antibiotic Note  Aaron Schmidt is a 83 y.o. male admitted on 09/04/2017 with sepsis and pleural effusion.  On 11/27, pt had US guided thoracentesis showed elevated LVH and pt was scheduled for bronch + VATS on 12/12 but was canceled due to weather.  On the evening of 12/12 pt became SOB and developed a fever.  Pt was admitted for IV antibiotics with Vanc/Zosyn.  On admission, SCr was elevated at 1.68 but has improved to 1.23.  Will adjust abx doses.  Plan: Change Vancomycin to 750mg  IV q12h - next dose due this AM at 1000. Continue Zosyn 3.375 gm IV q8h (4 hour infusion). Would recommend Vancomycin trough this weekend if therapy continues. Noted plans for VATS surgery possibly 12/17  Height: 5\' 10"  (177.8 cm) Weight: 185 lb 10 oz (84.2 kg) IBW/kg (Calculated) : 73  Temp (24hrs), Avg:98.3 F (36.8 C), Min:97.6 F (36.4 C), Max:99.2 F (37.3 C)  Recent Labs  Lab 09/04/17 1349 09/04/17 2216 09/05/17 0227 09/06/17 0354  WBC 18.8*  --  15.2* 13.8*  CREATININE 1.68*  --  1.37* 1.23  LATICACIDVEN  --  0.8 0.7  --     Estimated Creatinine Clearance: 47.8 mL/min (by C-G formula based on SCr of 1.23 mg/dL).    Allergies  Allergen Reactions  . Diazepam Other (See Comments)    "makes goofy"    Antimicrobials this admission: Vanc 12/13 >> Zosyn 12/13 >>  Dose adjustments this admission: Vancomycin 1250mg  IV q24 >> 750mg  IV q12 with improved CrCl  Microbiology results: 122/12 BCx: ngtd  Thank you for allowing pharmacy to be a part of this patient's care.  [employer], Pharm.D., BCPS Clinical Pharmacist Pager: [phone] Clinical phone for 09/06/2017 from 8:30-4:00 is [phone]. After 4pm, please call Main Rx [phone]) for assistance. 09/06/2017 9:41 AM

## 2017-09-06 NOTE — Progress Notes (Signed)
      [address]       [place] [address]             [phone]      C/o severe back pain. Says bed uncomfortable but worse sitting in chair  BP (!) 132/51 (BP Location: Right Arm)   Pulse 97   Temp 98.2 F (36.8 C) (Oral)   Resp 18   Ht 5\' 10"  (1.778 m)   Wt 185 lb 10 oz (84.2 kg)   SpO2 100%   BMI 26.63 kg/m    Intake/Output Summary (Last 24 hours) at 09/06/2017 1634 Last data filed at 09/06/2017 1500 Gross per 24 hour  Intake 836.67 ml  Output 1110 ml  Net -273.33 ml    Exam Elderly frail appearing man in obvious discomfort, no respiratory distress Absent BS left base Lungs otherwise clear  Temps and WBC trending down on Vanco and Zosyn Chronic back pain- on lyrica/ PRN dilaudid   Loculated left pleural effusion- plan for Left VATS on Monday, 2nd case  Remo Lipps C. Roxan Hockey, MD [facility] [phone]

## 2017-09-06 NOTE — Progress Notes (Signed)
Physical Therapy Treatment Patient Details Name: Aaron Schmidt MRN: [id number] DOB: 1933-02-10 Today's Date: 09/06/2017    History of Present Illness Patient is a 83 y/o male who presents with generalized weakness, fatigue, SOB and sleepiness. Recently diagnosed with left pleural effusion and scheduled for VATs and bronch but canceled due to snow. Found to have hyponatremia, dehydration, fevers and elevated WBC. CXR-left pleural effusion. Plan for possible VATs and bronch on Monday 12/17. PMH includes sleep apnea, HTN.     PT Comments    Pt moving slowly with all mobility and required increased assist for mobility this session from previous. Pt reporting he is very fatigued this AM. Pt ambulated in hall with min guard for 75 ft with RW. Slow shuffling gait, pt unsure if he shuffles at baseline. He would benefit from continued skilled PT to increase activity tolerance and functional independence. Will continue to follow acutely.    Follow Up Recommendations  Home health PT;Supervision for mobility/OOB     Equipment Recommendations  None recommended by PT    Recommendations for Other Services       Precautions / Restrictions Precautions Precautions: Fall Precaution Comments: watch HR Restrictions Weight Bearing Restrictions: No    Mobility  Bed Mobility Overal bed mobility: Needs Assistance Bed Mobility: Sidelying to Sit;Sit to Supine;Rolling Rolling: Min guard Sidelying to sit: HOB elevated;Min assist   Sit to supine: Min assist   General bed mobility comments: Pt required HHA to rise into seated position and min A to progress LE back onto bed.   Transfers Overall transfer level: Needs assistance Equipment used: Rolling walker (2 wheeled) Transfers: Sit to/from Stand Sit to Stand: Min assist         General transfer comment: Pt required min A to power up into standing. Cues for hand placement using RW  Ambulation/Gait Ambulation/Gait assistance: Min  guard Ambulation Distance (Feet): 75 Feet Assistive device: Rolling walker (2 wheeled) Gait Pattern/deviations: Step-through pattern;Trunk flexed;Shuffle;Decreased stride length Gait velocity: decreased   General Gait Details: Pt with slow shuffling steps. Pt unsure if he shuffles at baseline. Able to pick up feet when cued, however still demonstrated significantly decreased stride length.   Stairs            Wheelchair Mobility    Modified Rankin (Stroke Patients Only)       Balance Overall balance assessment: Needs assistance Sitting-balance support: Feet supported;No upper extremity supported Sitting balance-Leahy Scale: Good     Standing balance support: During functional activity Standing balance-Leahy Scale: Fair Standing balance comment: Able to stand statically without UE support but requires UE support for dynamic standing.                             Cognition Arousal/Alertness: Awake/alert Behavior During Therapy: WFL for tasks assessed/performed Overall Cognitive Status: Within Functional Limits for tasks assessed                                        Exercises      General Comments General comments (skin integrity, edema, etc.): pt reported no SOB during ambulation      Pertinent Vitals/Pain Pain Assessment: No/denies pain    Home Living                      Prior Function  PT Goals (current goals can now be found in the care plan section) Acute Rehab PT Goals Patient Stated Goal: to feel better PT Goal Formulation: With patient Time For Goal Achievement: 09/19/17 Potential to Achieve Goals: Good Progress towards PT goals: Progressing toward goals    Frequency    Min 3X/week      PT Plan Current plan remains appropriate    Co-evaluation              AM-PAC PT "6 Clicks" Daily Activity  Outcome Measure  Difficulty turning over in bed (including adjusting bedclothes, sheets  and blankets)?: None Difficulty moving from lying on back to sitting on the side of the bed? : None Difficulty sitting down on and standing up from a chair with arms (e.g., wheelchair, bedside commode, etc,.)?: None Help needed moving to and from a bed to chair (including a wheelchair)?: A Little Help needed walking in hospital room?: A Little Help needed climbing 3-5 steps with a railing? : A Lot 6 Click Score: 20    End of Session Equipment Utilized During Treatment: Gait belt Activity Tolerance: Patient tolerated treatment well Patient left: in bed;with call bell/phone within reach Nurse Communication: Mobility status PT Visit Diagnosis: Unsteadiness on feet (R26.81);Muscle weakness (generalized) (M62.81);Difficulty in walking, not elsewhere classified (R26.2)     Time: 7622-6333 PT Time Calculation (min) (ACUTE ONLY): 27 min  Charges:  $Gait Training: 8-22 mins $Therapeutic Activity: 8-22 mins                    G Codes:       Benjiman Core, [place] Pager [phone] Acute Rehab   Allena Katz 09/06/2017, 11:26 AM

## 2017-09-06 NOTE — Evaluation (Signed)
Clinical/Bedside Swallow Evaluation Patient Details  Name: Aaron Schmidt MRN: [id number] Date of Birth: 1933/06/10  Today's Date: 09/06/2017 Time: SLP Start Time (ACUTE ONLY): 95 SLP Stop Time (ACUTE ONLY): 1526 SLP Time Calculation (min) (ACUTE ONLY): 22 min  Past Medical History:  Past Medical History:  Diagnosis Date  . Arthritis    Degeneration spine & stenosis  . History of kidney stones   . Hypertension    has a histroy of  . Hypotension   . Sleep apnea 2012   used CPAP 2 yrs. ago, feels he sleeps better w/o, no longer using    Past Surgical History:  Past Surgical History:  Procedure Laterality Date  . ARM NEUROPLASTY     at 14 yrs. of age- fell off tractor- had fracture & repair *& later- 20's had  transplantation of a nerve at the elbow  . BACK SURGERY     x4 back surgery x2 fusion -  . CARPAL TUNNEL RELEASE Right   . COLONOSCOPY    . POSTERIOR CERVICAL FUSION/FORAMINOTOMY N/A 06/24/2014   Procedure: Posterior Cervical Three-Seven Fusion with Lateral Mass Fixation;  Surgeon: Erline Levine, MD;  Location: [facility];  Service: Neurosurgery;  Laterality: N/A;  C3-C7 posterior cervical fusion with lateral mass fixation  . SHOULDER SURGERY Bilateral    HPI:  Aaron Schmidt has a PMH of HTN, HLD, Diastolic Dysfunction EF 63-87%, presents with generalized weakness, SOB, left pleural effusion, dehydration, AKI on CKD   Assessment / Plan / Recommendation Clinical Impression  Pt presents with a suspected mild pharyngeal dysphagia. Pt with chronic low back pain which prohibited upright positioning for PO consumption. Clinical swallow evaluation completed with partial HOB elevation. Pt exhibited suspected delay in swallow initiation as well as multiple swallows and intermittent delayed throat clear following thin liquids. No overt s/sx of aspiration with any consistencies. Discussed bed "trending" option for optimizing positioning during PO consumption and reducing pts aspiration  risk. Continue regular thin liquid diet. ST to continue to follow briefly to ensure diet tolerance.  SLP Visit Diagnosis: Dysphagia, unspecified (R13.10)    Aspiration Risk  Mild aspiration risk    Diet Recommendation   Regular, thin liquids   Medication Administration: Whole meds with liquid    Other  Recommendations Oral Care Recommendations: Oral care BID   Follow up Recommendations        Frequency and Duration min 1 x/week  1 week       Prognosis Prognosis for Safe Diet Advancement: Good      Swallow Study   General Date of Onset: 09/04/17 HPI: Aaron Schmidt has a PMH of HTN, HLD, Diastolic Dysfunction EF 56-43%, presents with generalized weakness, SOB, left pleural effusion, dehydration, AKI on CKD Type of Study: Bedside Swallow Evaluation Previous Swallow Assessment: none on file Diet Prior to this Study: Regular;Thin liquids Temperature Spikes Noted: No Respiratory Status: Room air History of Recent Intubation: No Behavior/Cognition: Alert Oral Cavity - Dentition: Missing dentition Vision: Functional for self-feeding Self-Feeding Abilities: Needs set up Patient Positioning: Partially reclined(secondary with pain in back with fully upright positioning ) Baseline Vocal Quality: Normal Volitional Cough: Strong Volitional Swallow: Able to elicit    Oral/Motor/Sensory Function Overall Oral Motor/Sensory Function: Within functional limits   Ice Chips Ice chips: Impaired Presentation: Spoon Pharyngeal Phase Impairments: Suspected delayed Swallow;Multiple swallows   Thin Liquid Thin Liquid: Impaired Presentation: Cup;Straw;Spoon Oral Phase Functional Implications: Oral holding Pharyngeal  Phase Impairments: Suspected delayed Swallow;Multiple swallows;Throat Clearing - Delayed  Nectar Thick Nectar Thick Liquid: Not tested   Honey Thick Honey Thick Liquid: Not tested   Puree Puree: Within functional limits   Solid   GO   Solid: Within functional limits        Chelsea Hartness MA, CCC-SLP Acute Care Speech Language Pathologist    Chelsea E Hartness 09/06/2017,3:33 PM

## 2017-09-07 LAB — CBC
HCT: 29.4 % — ABNORMAL LOW (ref 39.0–52.0)
Hemoglobin: 9.5 g/dL — ABNORMAL LOW (ref 13.0–17.0)
MCH: 30.1 pg (ref 26.0–34.0)
MCHC: 32.3 g/dL (ref 30.0–36.0)
MCV: 93 fL (ref 78.0–100.0)
Platelets: 358 10*3/uL (ref 150–400)
RBC: 3.16 MIL/uL — ABNORMAL LOW (ref 4.22–5.81)
RDW: 14.4 % (ref 11.5–15.5)
WBC: 14.6 10*3/uL — ABNORMAL HIGH (ref 4.0–10.5)

## 2017-09-07 LAB — BASIC METABOLIC PANEL
Anion gap: 7 (ref 5–15)
BUN: 12 mg/dL (ref 6–20)
CO2: 26 mmol/L (ref 22–32)
Calcium: 7.9 mg/dL — ABNORMAL LOW (ref 8.9–10.3)
Chloride: 104 mmol/L (ref 101–111)
Creatinine, Ser: 1.03 mg/dL (ref 0.61–1.24)
GFR calc Af Amer: >60 mL/min (ref >60)
GFR calc non Af Amer: >60 mL/min (ref >60)
Glucose, Bld: 110 mg/dL — ABNORMAL HIGH (ref 65–99)
Potassium: 3.4 mmol/L — ABNORMAL LOW (ref 3.5–5.1)
Sodium: 137 mmol/L (ref 135–145)

## 2017-09-07 MED ORDER — POTASSIUM CHLORIDE CRYS ER 20 MEQ PO TBCR
40.0000 meq | EXTENDED_RELEASE_TABLET | Freq: Once | ORAL | Status: AC
  Administered 2017-09-07: 40 meq via ORAL
  Filled 2017-09-07: qty 2

## 2017-09-07 NOTE — Progress Notes (Signed)
PROGRESS NOTE  CORRION STIREWALT ZOX:[id number] DOB: October 08, 1932 DOA: 09/04/2017 PCP: Hulan Fess, MD  HPI/Recap of past 24 hours: Aaron Schmidt is a 83 y.o. male with medical history significant of HTN, HLD, diastolic dysfunction with last EF 60-65% with grade 1on 12/2016, chronic back pain, s/p multiple spinal surgeries, BPH, OSA; who presented with complaints of generalized weakness and shortness of breath. Initially, he complained of left thoracic back pain and had been found to have a left-sided loculated pleural effusion on 11/20.  He was referred to Dr. Melvyn Novas of pulmonology on 11/26, but thoracentesis only drained about 200 mL of fluid that revealed a significantly elevated LDH but negative cytology.  Subsequently, patient was sent to be evaluated by Dr. Gorden Harms of cardiothoracic surgery for planned video bronchoscopy, assisted thoracoscopy, and possible decortication. Missed appointment.On admission ED 101.2 F, WBC 18.8. Chest x-ray showing a moderate size loculated left pleural effusion.  No acute events reported overnight. Pt seen and examined at his bedside. States he "feels 1/2 better." Denies any chest pain, dyspnea, or palpitations.   Assessment/Plan: Principal Problem:   Pleural effusion, left Active Problems:   Essential hypertension   SIRS (systemic inflammatory response syndrome) (HCC)   Elevated troponin   Normochromic normocytic anemia   Hyponatremia   Acute kidney injury superimposed on chronic kidney disease (HCC)   Chronic pain  Left Pleural effusion, present on admission:  -Unresolved.  Noted to have left-sided pleural effusion on 11/20.  -Status post left thoracentesis with 220 cc hazy yellow fluid removed which revealed elevated LDH level, but cytology was negative 08/20/17.   -Previously planned bronchoscopy for 11/12 was canceled.  -Continuous pulse oximetry with nasal cannula oxygen as needed -DuoNeb's prn shortness of breath -cardiothoracic surgery  following and plans for possible VATS on Monday 09/09/17  Sepsis 2/2 to suspected HCAP, poa - cxr 09/04/17 revealed left pleural effusion and RML infiltrates - possible HCAP - swallow eval by speech mild aspiration risk- Regular thin liquids - on admission: WBC elevated at 18.8 with fever up to 101.2 F.  - Sepsis protocol initiated on admission - blood cultures x 2 no growth 2 days - IV Vancomycin and Zosyn day #3 - lactic acid 0.7 (09/05/17) from 0.8 - afebrile, wbc 14k (09/07/17) from 13k  Acute kidney injury on chronic kidney disease -resolving -baseline creatinine 1.1. cr 1.68 on presentation -cr 1.03 (09/06/17) from 1.23 from 1.37 from 1.68 -avoid nephrotoxic agents/hypotension - IV fluids Normal Saline reduced to 50 ml/hr as tolerated - BMP in a.m.  Hypokalemia -improving K+ 3.4 from 3.2 -replete as indicated -BMP am  Chronic normocytic normochromic anemia -stable -hg 9.6 from 8.9 from 10.6 -baseline hg 11 -no sign of overt bleeding -cbc am  Hyponatremia, resolved -unclear etiology- - Na+ 137 from 132 from 129 -BMP am   Chronic lower back pain,  chronic opioid use -multiple back surgeries -Dilaudid, Lyrica, Flexeril -Continue home pain regimen  Essential hypertension -BP is stable -hold hctz due to AKI  Hyperlipidemia -Continue simvastatin  BPH -stable -Continue Proscar    Code Status: stable   Family Communication: No family member at bedside  Disposition Plan: willl stay another midnight to continue current management. Possible VATS ion Monday 09/09/17 by CTS.   Consultants:  Cardiothoracic surgery  Procedures:  none  Antimicrobials:  IV vanc and zosyn day #3  DVT prophylaxis:  heparin 5000 u sq tid   Objective: Vitals:   09/06/17 0520 09/06/17 1455 09/06/17 2146 09/07/17 0500  BP: 133/60 Marland Kitchen)  132/51 (!) 119/58 127/63  Pulse: 93 97 91 93  Resp: 18 18 18 17   Temp: 99.2 F (37.3 C) 98.2 F (36.8 C) 97.7 F (36.5 C)  98.6 F (37 C)  TempSrc: Oral Oral Oral Oral  SpO2: 98% 100% 100% 97%  Weight:      Height:        Intake/Output Summary (Last 24 hours) at 09/07/2017 0827 Last data filed at 09/07/2017 0500 Gross per 24 hour  Intake 1076.67 ml  Output 825 ml  Net 251.67 ml   Filed Weights   09/04/17 1226 09/04/17 1907 09/05/17 0500  Weight: 79.8 kg (176 lb) 84.2 kg (185 lb 10 oz) 84.2 kg (185 lb 10 oz)    Exam:   General:  83 yo CM WD WN uncomfortable due to chronic lower back pain   Cardiovascular: RRR no rubs or gallops  Respiratory: no wheezes. Mild rales at bases bilaterally  Abdomen: soft NT ND NBS x4  Musculoskeletal: moves all 4 non focal  Skin: no noted open lesions  Psychiatry: Mood is appropriate    Data Reviewed: CBC: Recent Labs  Lab 09/04/17 1349 09/05/17 0227 09/06/17 0354 09/07/17 0449  WBC 18.8* 15.2* 13.8* 14.6*  NEUTROABS 17.2*  --   --   --   HGB 10.6* 8.9* 9.6* 9.5*  HCT 32.3* 27.2* 30.1* 29.4*  MCV 93.6 92.2 93.2 93.0  PLT 313 280 329 976   Basic Metabolic Panel: Recent Labs  Lab 09/04/17 1349 09/05/17 0227 09/06/17 0354 09/07/17 0449  NA 129* 132* 137 137  K 3.7 3.5 3.2* 3.4*  CL 94* 98* 101 104  CO2 25 26 27 26   GLUCOSE 170* 127* 116* 110*  BUN 38* 30* 21* 12  CREATININE 1.68* 1.37* 1.23 1.03  CALCIUM 8.5* 8.1* 8.3* 7.9*   GFR: Estimated Creatinine Clearance: 57.1 mL/min (by C-G formula based on SCr of 1.03 mg/dL). Liver Function Tests: Recent Labs  Lab 09/05/17 0227  AST 89*  ALT 76*  ALKPHOS 100  BILITOT 0.6  PROT 5.8*  ALBUMIN 2.0*   No results for input(s): LIPASE, AMYLASE in the last 168 hours. No results for input(s): AMMONIA in the last 168 hours. Coagulation Profile: No results for input(s): INR, PROTIME in the last 168 hours. Cardiac Enzymes: Recent Labs  Lab 09/04/17 1349  TROPONINI 0.05*   BNP (last 3 results) No results for input(s): PROBNP in the last 8760 hours. HbA1C: No results for input(s): HGBA1C  in the last 72 hours. CBG: No results for input(s): GLUCAP in the last 168 hours. Lipid Profile: No results for input(s): CHOL, HDL, LDLCALC, TRIG, CHOLHDL, LDLDIRECT in the last 72 hours. Thyroid Function Tests: No results for input(s): TSH, T4TOTAL, FREET4, T3FREE, THYROIDAB in the last 72 hours. Anemia Panel: No results for input(s): VITAMINB12, FOLATE, FERRITIN, TIBC, IRON, RETICCTPCT in the last 72 hours. Urine analysis:    Component Value Date/Time   COLORURINE YELLOW 09/04/2017 [place] 09/04/2017 1445   LABSPEC 1.015 09/04/2017 1445   PHURINE 6.0 09/04/2017 1445   GLUCOSEU NEGATIVE 09/04/2017 1445   HGBUR NEGATIVE 09/04/2017 1445   BILIRUBINUR NEGATIVE 09/04/2017 1445   KETONESUR NEGATIVE 09/04/2017 1445   PROTEINUR NEGATIVE 09/04/2017 1445   UROBILINOGEN 0.2 10/20/2010 1625   NITRITE NEGATIVE 09/04/2017 1445   LEUKOCYTESUR NEGATIVE 09/04/2017 1445   Sepsis Labs: @LABRCNTIP (procalcitonin:4,lacticidven:4)  ) Recent Results (from the past 240 hour(s))  Culture, blood (x 2)     Status: None (Preliminary result)   Collection  Time: 09/04/17 10:30 PM  Result Value Ref Range Status   Specimen Description BLOOD LEFT ARM  Final   Special Requests   Final    BOTTLES DRAWN AEROBIC AND ANAEROBIC Blood Culture adequate volume   Culture NO GROWTH 1 DAY  Final   Report Status PENDING  Incomplete  Culture, blood (x 2)     Status: None (Preliminary result)   Collection Time: 09/04/17 10:38 PM  Result Value Ref Range Status   Specimen Description BLOOD RIGHT ARM  Final   Special Requests IN PEDIATRIC BOTTLE Blood Culture adequate volume  Final   Culture NO GROWTH 1 DAY  Final   Report Status PENDING  Incomplete      Studies: No results found.  Scheduled Meds: . bisacodyl  10 mg Rectal Once  . feeding supplement  237 mL Oral TID WC  . feeding supplement (ENSURE ENLIVE)  237 mL Oral BID BM  . finasteride  5 mg Oral Daily  . heparin injection (subcutaneous)   5,000 Units Subcutaneous Q8H  . nortriptyline  25 mg Oral QHS  . pregabalin  150 mg Oral BID  . senna  2 tablet Oral Daily  . simvastatin  40 mg Oral QPM    Continuous Infusions: . sodium chloride 50 mL/hr at 09/06/17 1810  . piperacillin-tazobactam (ZOSYN)  IV 3.375 g (09/07/17 0533)  . vancomycin Stopped (09/06/17 2300)     LOS: 3 days     Kayleen Memos, MD [facility] Pager [phone]  If 7PM-7AM, please contact night-coverage [email or web address] Password [facility] 09/07/2017, 8:27 AM

## 2017-09-08 DIAGNOSIS — G894 Chronic pain syndrome: Secondary | ICD-10-CM

## 2017-09-08 LAB — GLUCOSE, CAPILLARY: Glucose-Capillary: 100 mg/dL — ABNORMAL HIGH (ref 65–99)

## 2017-09-08 LAB — VANCOMYCIN, TROUGH: Vancomycin Tr: 17 ug/mL (ref 15–20)

## 2017-09-08 NOTE — Progress Notes (Signed)
PROGRESS NOTE  [name] EXH:[id number] DOB: 1932-12-24 DOA: 09/04/2017 PCP: Hulan Fess, MD  HPI/Recap of past 24 hours: Aaron Schmidt is a 83 y.o. male with medical history significant of HTN, HLD, diastolic dysfunction with last EF 60-65% with grade 1on 12/2016, chronic back pain, s/p multiple spinal surgeries, BPH, OSA; who presented with complaints of generalized weakness and shortness of breath. Initially, he complained of left thoracic back pain and had been found to have a left-sided loculated pleural effusion on 11/20.  He was referred to Dr. Melvyn Novas of pulmonology on 11/26, but thoracentesis only drained about 200 mL of fluid that revealed a significantly elevated LDH but negative cytology.  Subsequently, patient was sent to be evaluated by Dr. Gorden Harms of cardiothoracic surgery for planned video bronchoscopy, assisted thoracoscopy, and possible decortication. Missed appointment.On admission ED 101.2 F, WBC 18.8. Chest x-ray showing a moderate size loculated left pleural effusion.  No acute events reported overnight. Complaints of lower back pain when he moves. Admits to productive cough which is improved. Denies chest pain. Planned VATS tomorrow. CTS following.   Assessment/Plan: Principal Problem:   Pleural effusion, left Active Problems:   Essential hypertension   SIRS (systemic inflammatory response syndrome) (HCC)   Elevated troponin   Normochromic normocytic anemia   Hyponatremia   Acute kidney injury superimposed on chronic kidney disease (HCC)   Chronic pain  Left Pleural effusion, present on admission:  -Unresolved.  Noted to have left-sided pleural effusion on 11/20.  -Status post left thoracentesis with 220 cc hazy yellow fluid removed which revealed elevated LDH level, but cytology was negative 08/20/17.   -Previously planned bronchoscopy for 11/12 was canceled.  -Continuous pulse oximetry with nasal cannula oxygen as needed -DuoNeb's prn shortness of  breath -cardiothoracic surgery following and plans for possible VATS on Monday 09/09/17  Sepsis 2/2 to suspected HCAP, poa - cxr 09/04/17 revealed left pleural effusion and RML infiltrates - possible HCAP - swallow eval by speech mild aspiration risk- Regular thin liquids - on admission: WBC elevated at 18.8 with fever up to 101.2 F.  - Sepsis protocol initiated on admission - blood cultures x 2 no growth - IV Vancomycin and Zosyn day #4 - lactic acid 0.7 (09/05/17) from 0.8 - afebrile  Acute kidney injury on chronic kidney disease -resolving -baseline creatinine 1.1. cr 1.68 on presentation -cr 1.03 (09/06/17) from 1.23 from 1.37 from 1.68 -avoid nephrotoxic agents/hypotension - IV fluids Normal Saline reduced to 50 ml/hr as tolerated - BMP in a.m.  Hypokalemia -improving K+ 3.4 from 3.2 -replete as indicated -BMP am  Chronic normocytic normochromic anemia -stable -hg 9.6 from 8.9 from 10.6 -baseline hg 11 -no sign of overt bleeding -cbc am  Hyponatremia, resolved -unclear etiology- - Na+ 137 from 132 from 129 -BMP am   Chronic lower back pain,  chronic opioid use -multiple back surgeries -Dilaudid, Lyrica, Flexeril -Continue home pain regimen  Essential hypertension -BP is stable -hold hctz due to AKI  Hyperlipidemia -Continue simvastatin  BPH -stable -Continue Proscar    Code Status: stable   Family Communication: No family member at bedside  Disposition Plan: willl stay another midnight to continue current management. Possible VATS ion Monday 09/09/17 by CTS.   Consultants:  Cardiothoracic surgery  Procedures:  none  Antimicrobials:  IV vanc and zosyn day #4  DVT prophylaxis:  heparin 5000 u sq tid   Objective: Vitals:   09/07/17 0500 09/07/17 1444 09/07/17 2235 09/08/17 0603  BP: 127/63 126/61 (!) 127/57  129/63  Pulse: 93 93 91 93  Resp: 17 18 18 18   Temp: 98.6 F (37 C) 98.3 F (36.8 C) 98.5 F (36.9 C) 98 F (36.7  C)  TempSrc: Oral Oral Oral Oral  SpO2: 97% 100% 100% 96%  Weight:      Height:        Intake/Output Summary (Last 24 hours) at 09/08/2017 0806 Last data filed at 09/07/2017 2200 Gross per 24 hour  Intake 440 ml  Output 900 ml  Net -460 ml   Filed Weights   09/04/17 1226 09/04/17 1907 09/05/17 0500  Weight: 79.8 kg (176 lb) 84.2 kg (185 lb 10 oz) 84.2 kg (185 lb 10 oz)    Exam:   General:  83 yo CM WD WN uncomfortable due to chronic lower back pain   Cardiovascular: RRR no rubs or gallops  Respiratory: no wheezes. Mild rales at bases bilaterally  Abdomen: soft NT ND NBS x4  Musculoskeletal: moves all 4 non focal  Skin: no noted open lesions  Psychiatry: Mood is appropriate    Data Reviewed: CBC: Recent Labs  Lab 09/04/17 1349 09/05/17 0227 09/06/17 0354 09/07/17 0449  WBC 18.8* 15.2* 13.8* 14.6*  NEUTROABS 17.2*  --   --   --   HGB 10.6* 8.9* 9.6* 9.5*  HCT 32.3* 27.2* 30.1* 29.4*  MCV 93.6 92.2 93.2 93.0  PLT 313 280 329 419   Basic Metabolic Panel: Recent Labs  Lab 09/04/17 1349 09/05/17 0227 09/06/17 0354 09/07/17 0449  NA 129* 132* 137 137  K 3.7 3.5 3.2* 3.4*  CL 94* 98* 101 104  CO2 25 26 27 26   GLUCOSE 170* 127* 116* 110*  BUN 38* 30* 21* 12  CREATININE 1.68* 1.37* 1.23 1.03  CALCIUM 8.5* 8.1* 8.3* 7.9*   GFR: Estimated Creatinine Clearance: 57.1 mL/min (by C-G formula based on SCr of 1.03 mg/dL). Liver Function Tests: Recent Labs  Lab 09/05/17 0227  AST 89*  ALT 76*  ALKPHOS 100  BILITOT 0.6  PROT 5.8*  ALBUMIN 2.0*   No results for input(s): LIPASE, AMYLASE in the last 168 hours. No results for input(s): AMMONIA in the last 168 hours. Coagulation Profile: No results for input(s): INR, PROTIME in the last 168 hours. Cardiac Enzymes: Recent Labs  Lab 09/04/17 1349  TROPONINI 0.05*   BNP (last 3 results) No results for input(s): PROBNP in the last 8760 hours. HbA1C: No results for input(s): HGBA1C in the last 72  hours. CBG: No results for input(s): GLUCAP in the last 168 hours. Lipid Profile: No results for input(s): CHOL, HDL, LDLCALC, TRIG, CHOLHDL, LDLDIRECT in the last 72 hours. Thyroid Function Tests: No results for input(s): TSH, T4TOTAL, FREET4, T3FREE, THYROIDAB in the last 72 hours. Anemia Panel: No results for input(s): VITAMINB12, FOLATE, FERRITIN, TIBC, IRON, RETICCTPCT in the last 72 hours. Urine analysis:    Component Value Date/Time   COLORURINE YELLOW 09/04/2017 [place] 09/04/2017 1445   LABSPEC 1.015 09/04/2017 1445   PHURINE 6.0 09/04/2017 1445   GLUCOSEU NEGATIVE 09/04/2017 1445   HGBUR NEGATIVE 09/04/2017 1445   BILIRUBINUR NEGATIVE 09/04/2017 1445   KETONESUR NEGATIVE 09/04/2017 1445   PROTEINUR NEGATIVE 09/04/2017 1445   UROBILINOGEN 0.2 10/20/2010 1625   NITRITE NEGATIVE 09/04/2017 1445   LEUKOCYTESUR NEGATIVE 09/04/2017 1445   Sepsis Labs: @LABRCNTIP (procalcitonin:4,lacticidven:4)  ) Recent Results (from the past 240 hour(s))  Culture, blood (x 2)     Status: None (Preliminary result)   Collection Time: 09/04/17 10:30  PM  Result Value Ref Range Status   Specimen Description BLOOD LEFT ARM  Final   Special Requests   Final    BOTTLES DRAWN AEROBIC AND ANAEROBIC Blood Culture adequate volume   Culture NO GROWTH 2 DAYS  Final   Report Status PENDING  Incomplete  Culture, blood (x 2)     Status: None (Preliminary result)   Collection Time: 09/04/17 10:38 PM  Result Value Ref Range Status   Specimen Description BLOOD RIGHT ARM  Final   Special Requests IN PEDIATRIC BOTTLE Blood Culture adequate volume  Final   Culture NO GROWTH 2 DAYS  Final   Report Status PENDING  Incomplete      Studies: No results found.  Scheduled Meds: . bisacodyl  10 mg Rectal Once  . feeding supplement  237 mL Oral TID WC  . feeding supplement (ENSURE ENLIVE)  237 mL Oral BID BM  . finasteride  5 mg Oral Daily  . heparin injection (subcutaneous)  5,000 Units  Subcutaneous Q8H  . nortriptyline  25 mg Oral QHS  . pregabalin  150 mg Oral BID  . senna  2 tablet Oral Daily  . simvastatin  40 mg Oral QPM    Continuous Infusions: . sodium chloride 50 mL/hr at 09/07/17 1333  . piperacillin-tazobactam (ZOSYN)  IV 3.375 g (09/08/17 0512)  . vancomycin Stopped (09/07/17 2244)     LOS: 4 days     Kayleen Memos, MD [facility] Pager [phone]  If 7PM-7AM, please contact night-coverage [email or web address] Password [email or web address] 09/08/2017, 8:06 AM

## 2017-09-08 NOTE — Progress Notes (Addendum)
Pharmacy Antibiotic Note  Aaron Schmidt is a 83 y.o. male admitted on 09/04/2017 with sepsis and pleural effusion vs HCAP.  Pharmacy has been consulted for vancomycin and zosyn dosing. This is day 4 of broad-spectrum antibiotics. WBC remains elevated but improving, patient is currently afebrile. Vancomycin trough is within goal range at 17. Renal function continues to improve.  Plan: Continue Zosyn 3.375 gram IV q 8 hrs (4-hr infusion) Continue vancomycin 750 mg IV q12 h. Goal trough 15-20 Monitor renal function closely for future dose adjustments - vanc trough as indicated Follow culture results, clinical status, LOT/de-escalation plans  Height: 5\' 10"  (177.8 cm) Weight: 185 lb 10 oz (84.2 kg) IBW/kg (Calculated) : 73  Temp (24hrs), Avg:98.3 F (36.8 C), Min:98 F (36.7 C), Max:98.5 F (36.9 C)  Recent Labs  Lab 09/04/17 1349 09/04/17 2216 09/05/17 0227 09/06/17 0354 09/07/17 0449 09/08/17 0916  WBC 18.8*  --  15.2* 13.8* 14.6*  --   CREATININE 1.68*  --  1.37* 1.23 1.03  --   LATICACIDVEN  --  0.8 0.7  --   --   --   VANCOTROUGH  --   --   --   --   --  17    Estimated Creatinine Clearance: 57.1 mL/min (by C-G formula based on SCr of 1.03 mg/dL).    Allergies  Allergen Reactions  . Diazepam Other (See Comments)    "makes goofy"    Antimicrobials this admission: Vancomycin 12/13>> Zosyn 12/13>>  Dose adjustments this admission: 12/14: Vancomycin adjusted from 1250 mg IV q 24 to 750 mg IV q 12 in light of improving renal function  Microbiology results: 2/12 blood x 2 >>ngtd 11/27 pleural fluid >> no growth Final   Thank you for allowing pharmacy to be a part of this patient's care.  Cleaster Corin [facility] 09/08/2017 11:09 AM

## 2017-09-08 NOTE — Progress Notes (Signed)
Pharmacy was called about vancomycin trough being 17 and whether or not to give the already scheduled vancomycin. The pharmacist said it was okay to give.

## 2017-09-09 ENCOUNTER — Encounter (HOSPITAL_COMMUNITY)
Admission: EM | Disposition: A | Discharge status: Home Health | Payer: Self-pay | Source: Home / Self Care | Attending: Internal Medicine

## 2017-09-09 ENCOUNTER — Inpatient Hospital Stay (HOSPITAL_COMMUNITY): Payer: Medicare Other

## 2017-09-09 ENCOUNTER — Inpatient Hospital Stay (HOSPITAL_COMMUNITY): Admission: RE | Admit: 2017-09-09 | Payer: Medicare Other | Source: Ambulatory Visit

## 2017-09-09 ENCOUNTER — Inpatient Hospital Stay (HOSPITAL_COMMUNITY): Payer: Medicare Other | Admitting: Anesthesiology

## 2017-09-09 ENCOUNTER — Inpatient Hospital Stay (HOSPITAL_COMMUNITY)
Admission: RE | Admit: 2017-09-09 | Payer: Medicare Other | Source: Ambulatory Visit | Admitting: Thoracic Surgery (Cardiothoracic Vascular Surgery)

## 2017-09-09 ENCOUNTER — Encounter (HOSPITAL_COMMUNITY): Payer: Self-pay | Admitting: Certified Registered Nurse Anesthetist

## 2017-09-09 DIAGNOSIS — J869 Pyothorax without fistula: Secondary | ICD-10-CM | POA: Diagnosis present

## 2017-09-09 HISTORY — PX: VIDEO BRONCHOSCOPY: SHX5072

## 2017-09-09 HISTORY — PX: VIDEO ASSISTED THORACOSCOPY (VATS)/DECORTICATION: SHX6171

## 2017-09-09 HISTORY — DX: Pyothorax without fistula: J86.9

## 2017-09-09 HISTORY — PX: PLEURAL EFFUSION DRAINAGE: SHX5099

## 2017-09-09 LAB — CBC
HCT: 29.9 % — ABNORMAL LOW (ref 39.0–52.0)
HCT: 28 % — ABNORMAL LOW (ref 39.0–52.0)
HCT: 28.5 % — ABNORMAL LOW (ref 39.0–52.0)
Hemoglobin: 9.5 g/dL — ABNORMAL LOW (ref 13.0–17.0)
Hemoglobin: 9.2 g/dL — ABNORMAL LOW (ref 13.0–17.0)
Hemoglobin: 9.2 g/dL — ABNORMAL LOW (ref 13.0–17.0)
MCH: 29.5 pg (ref 26.0–34.0)
MCH: 31.1 pg (ref 26.0–34.0)
MCH: 30.3 pg (ref 26.0–34.0)
MCHC: 31.8 g/dL (ref 30.0–36.0)
MCHC: 32.9 g/dL (ref 30.0–36.0)
MCHC: 32.3 g/dL (ref 30.0–36.0)
MCV: 92.9 fL (ref 78.0–100.0)
MCV: 94.6 fL (ref 78.0–100.0)
MCV: 93.8 fL (ref 78.0–100.0)
Platelets: 383 10*3/uL (ref 150–400)
Platelets: 356 10*3/uL (ref 150–400)
Platelets: 406 10*3/uL — ABNORMAL HIGH (ref 150–400)
RBC: 3.22 MIL/uL — ABNORMAL LOW (ref 4.22–5.81)
RBC: 2.96 MIL/uL — ABNORMAL LOW (ref 4.22–5.81)
RBC: 3.04 MIL/uL — ABNORMAL LOW (ref 4.22–5.81)
RDW: 14.7 % (ref 11.5–15.5)
RDW: 14.9 % (ref 11.5–15.5)
RDW: 14.8 % (ref 11.5–15.5)
WBC: 14.1 10*3/uL — ABNORMAL HIGH (ref 4.0–10.5)
WBC: 13.6 10*3/uL — ABNORMAL HIGH (ref 4.0–10.5)
WBC: 23.7 10*3/uL — ABNORMAL HIGH (ref 4.0–10.5)

## 2017-09-09 LAB — COMPREHENSIVE METABOLIC PANEL
ALT: 70 U/L — ABNORMAL HIGH (ref 17–63)
AST: 70 U/L — ABNORMAL HIGH (ref 15–41)
Albumin: 1.7 g/dL — ABNORMAL LOW (ref 3.5–5.0)
Alkaline Phosphatase: 121 U/L (ref 38–126)
Anion gap: 9 (ref 5–15)
BUN: 8 mg/dL (ref 6–20)
CO2: 25 mmol/L (ref 22–32)
Calcium: 8.3 mg/dL — ABNORMAL LOW (ref 8.9–10.3)
Chloride: 104 mmol/L (ref 101–111)
Creatinine, Ser: 0.91 mg/dL (ref 0.61–1.24)
GFR calc Af Amer: >60 mL/min (ref >60)
GFR calc non Af Amer: >60 mL/min (ref >60)
Glucose, Bld: 107 mg/dL — ABNORMAL HIGH (ref 65–99)
Potassium: 3.5 mmol/L (ref 3.5–5.1)
Sodium: 138 mmol/L (ref 135–145)
Total Bilirubin: 0.7 mg/dL (ref 0.3–1.2)
Total Protein: 5.2 g/dL — ABNORMAL LOW (ref 6.5–8.1)

## 2017-09-09 LAB — BASIC METABOLIC PANEL
Anion gap: 8 (ref 5–15)
Anion gap: 11 (ref 5–15)
BUN: 9 mg/dL (ref 6–20)
BUN: 12 mg/dL (ref 6–20)
CO2: 25 mmol/L (ref 22–32)
CO2: 21 mmol/L — ABNORMAL LOW (ref 22–32)
Calcium: 8.1 mg/dL — ABNORMAL LOW (ref 8.9–10.3)
Calcium: 8.1 mg/dL — ABNORMAL LOW (ref 8.9–10.3)
Chloride: 103 mmol/L (ref 101–111)
Chloride: 104 mmol/L (ref 101–111)
Creatinine, Ser: 0.92 mg/dL (ref 0.61–1.24)
Creatinine, Ser: 1.16 mg/dL (ref 0.61–1.24)
GFR calc Af Amer: >60 mL/min (ref >60)
GFR calc Af Amer: >60 mL/min (ref >60)
GFR calc non Af Amer: >60 mL/min (ref >60)
GFR calc non Af Amer: 57 mL/min — ABNORMAL LOW (ref >60)
Glucose, Bld: 109 mg/dL — ABNORMAL HIGH (ref 65–99)
Glucose, Bld: 184 mg/dL — ABNORMAL HIGH (ref 65–99)
Potassium: 3.5 mmol/L (ref 3.5–5.1)
Potassium: 3.8 mmol/L (ref 3.5–5.1)
Sodium: 136 mmol/L (ref 135–145)
Sodium: 136 mmol/L (ref 135–145)

## 2017-09-09 LAB — POCT I-STAT 3, ART BLOOD GAS (G3+)
Acid-base deficit: 4 mmol/L — ABNORMAL HIGH (ref 0.0–2.0)
Bicarbonate: 20.4 mmol/L (ref 20.0–28.0)
O2 Saturation: 95 %
Patient temperature: 36.5
TCO2: 21 mmol/L — ABNORMAL LOW (ref 22–32)
pCO2 arterial: 34.1 mmHg (ref 32.0–48.0)
pH, Arterial: 7.382 (ref 7.350–7.450)
pO2, Arterial: 76 mmHg — ABNORMAL LOW (ref 83.0–108.0)

## 2017-09-09 LAB — BLOOD GAS, ARTERIAL
Acid-Base Excess: 2.3 mmol/L — ABNORMAL HIGH (ref 0.0–2.0)
Bicarbonate: 26 mmol/L (ref 20.0–28.0)
Drawn by: 275531
FIO2: 21
O2 Saturation: 91.6 %
Patient temperature: 98.6
pCO2 arterial: 37.8 mmHg (ref 32.0–48.0)
pH, Arterial: 7.452 — ABNORMAL HIGH (ref 7.350–7.450)
pO2, Arterial: 61.8 mmHg — ABNORMAL LOW (ref 83.0–108.0)

## 2017-09-09 LAB — PROTIME-INR
INR: 1.24
Prothrombin Time: 15.5 seconds — ABNORMAL HIGH (ref 11.4–15.2)

## 2017-09-09 LAB — MRSA PCR SCREENING: MRSA by PCR: NEGATIVE

## 2017-09-09 LAB — PREPARE RBC (CROSSMATCH)

## 2017-09-09 LAB — APTT: aPTT: 34 seconds (ref 24–36)

## 2017-09-09 SURGERY — VIDEO ASSISTED THORACOSCOPY (VATS)/DECORTICATION
Anesthesia: General | Site: Chest | Laterality: Left

## 2017-09-09 MED ORDER — SODIUM CHLORIDE 0.9% FLUSH: 9.0000 mL | INTRAVENOUS | Status: DC | PRN

## 2017-09-09 MED ORDER — DEXTROSE-NACL 5-0.9 % IV SOLN
INTRAVENOUS | Status: DC
  Administered 2017-09-09: 22:00:00 via INTRAVENOUS

## 2017-09-09 MED ORDER — DIPHENHYDRAMINE HCL 50 MG/ML IJ SOLN: 12.5000 mg | Freq: Four times a day (QID) | INTRAMUSCULAR | Status: DC | PRN

## 2017-09-09 MED ORDER — SUFENTANIL CITRATE 50 MCG/ML IV SOLN
INTRAVENOUS | Status: AC
  Filled 2017-09-09: qty 1

## 2017-09-09 MED ORDER — PROPOFOL 10 MG/ML IV BOLUS
INTRAVENOUS | Status: AC
  Filled 2017-09-09: qty 20

## 2017-09-09 MED ORDER — SODIUM CHLORIDE 0.9 % IJ SOLN
INTRAMUSCULAR | Status: AC
  Filled 2017-09-09: qty 10

## 2017-09-09 MED ORDER — FENTANYL CITRATE (PF) 100 MCG/2ML IJ SOLN
INTRAMUSCULAR | Status: AC
  Filled 2017-09-09: qty 2

## 2017-09-09 MED ORDER — MIDAZOLAM HCL 2 MG/2ML IJ SOLN
INTRAMUSCULAR | Status: AC
  Filled 2017-09-09: qty 2

## 2017-09-09 MED ORDER — SENNOSIDES-DOCUSATE SODIUM 8.6-50 MG PO TABS
1.0000 | ORAL_TABLET | Freq: Every day | ORAL | Status: DC
  Administered 2017-09-09 – 2017-09-10 (×2): 1 via ORAL
  Filled 2017-09-09 (×4): qty 1

## 2017-09-09 MED ORDER — ACETAMINOPHEN 500 MG PO TABS
1000.0000 mg | ORAL_TABLET | Freq: Four times a day (QID) | ORAL | Status: DC
  Administered 2017-09-10 – 2017-09-14 (×15): 1000 mg via ORAL
  Filled 2017-09-09 (×16): qty 2

## 2017-09-09 MED ORDER — LEVALBUTEROL HCL 0.63 MG/3ML IN NEBU
0.6300 mg | INHALATION_SOLUTION | Freq: Three times a day (TID) | RESPIRATORY_TRACT | Status: DC
  Administered 2017-09-10 – 2017-09-14 (×11): 0.63 mg via RESPIRATORY_TRACT
  Filled 2017-09-09 (×14): qty 3

## 2017-09-09 MED ORDER — HYDROMORPHONE HCL 1 MG/ML IJ SOLN
INTRAMUSCULAR | Status: AC
  Filled 2017-09-09: qty 1

## 2017-09-09 MED ORDER — 0.9 % SODIUM CHLORIDE (POUR BTL) OPTIME
TOPICAL | Status: DC | PRN
  Administered 2017-09-09: 1000 mL
  Administered 2017-09-09: 2000 mL

## 2017-09-09 MED ORDER — DEXTROSE 5 % IV SOLN
1.5000 g | INTRAVENOUS | Status: AC
  Administered 2017-09-09: 1.5 g via INTRAVENOUS

## 2017-09-09 MED ORDER — EPHEDRINE 5 MG/ML INJ
INTRAVENOUS | Status: AC
  Filled 2017-09-09: qty 10

## 2017-09-09 MED ORDER — PHENYLEPHRINE HCL 10 MG/ML IJ SOLN
INTRAMUSCULAR | Status: DC | PRN
  Administered 2017-09-09: 25 ug/min via INTRAVENOUS

## 2017-09-09 MED ORDER — ROCURONIUM BROMIDE 100 MG/10ML IV SOLN
INTRAVENOUS | Status: DC | PRN
  Administered 2017-09-09: 30 mg via INTRAVENOUS
  Administered 2017-09-09: 10 mg via INTRAVENOUS
  Administered 2017-09-09: 30 mg via INTRAVENOUS
  Administered 2017-09-09: 40 mg via INTRAVENOUS

## 2017-09-09 MED ORDER — ACETAMINOPHEN 160 MG/5ML PO SOLN
1000.0000 mg | Freq: Four times a day (QID) | ORAL | Status: DC
  Administered 2017-09-12: 1000 mg via ORAL
  Filled 2017-09-09: qty 40.6

## 2017-09-09 MED ORDER — PHENYLEPHRINE 40 MCG/ML (10ML) SYRINGE FOR IV PUSH (FOR BLOOD PRESSURE SUPPORT)
PREFILLED_SYRINGE | INTRAVENOUS | Status: AC
  Filled 2017-09-09: qty 10

## 2017-09-09 MED ORDER — ONDANSETRON HCL 4 MG/2ML IJ SOLN: 4.0000 mg | Freq: Four times a day (QID) | INTRAMUSCULAR | Status: DC | PRN

## 2017-09-09 MED ORDER — LEVALBUTEROL HCL 0.63 MG/3ML IN NEBU
0.6300 mg | INHALATION_SOLUTION | Freq: Four times a day (QID) | RESPIRATORY_TRACT | Status: DC
  Administered 2017-09-09: 0.63 mg via RESPIRATORY_TRACT
  Filled 2017-09-09: qty 3

## 2017-09-09 MED ORDER — PROMETHAZINE HCL 25 MG/ML IJ SOLN: 6.2500 mg | INTRAMUSCULAR | Status: DC | PRN

## 2017-09-09 MED ORDER — FENTANYL CITRATE (PF) 250 MCG/5ML IJ SOLN
INTRAMUSCULAR | Status: AC
  Filled 2017-09-09: qty 5

## 2017-09-09 MED ORDER — DIPHENHYDRAMINE HCL 12.5 MG/5ML PO ELIX
12.5000 mg | ORAL_SOLUTION | Freq: Four times a day (QID) | ORAL | Status: DC | PRN
  Filled 2017-09-09: qty 5

## 2017-09-09 MED ORDER — TRAMADOL HCL 50 MG PO TABS: 50.0000 mg | ORAL_TABLET | Freq: Four times a day (QID) | ORAL | Status: DC | PRN

## 2017-09-09 MED ORDER — ONDANSETRON HCL 4 MG/2ML IJ SOLN
INTRAMUSCULAR | Status: DC | PRN
  Administered 2017-09-09: 4 mg via INTRAVENOUS

## 2017-09-09 MED ORDER — PROPOFOL 10 MG/ML IV BOLUS
INTRAVENOUS | Status: DC | PRN
  Administered 2017-09-09: 130 mg via INTRAVENOUS

## 2017-09-09 MED ORDER — ALBUTEROL SULFATE (2.5 MG/3ML) 0.083% IN NEBU
INHALATION_SOLUTION | RESPIRATORY_TRACT | Status: AC
  Filled 2017-09-09: qty 3

## 2017-09-09 MED ORDER — DEXTROSE 5 % IV SOLN: 1.5000 g | INTRAVENOUS | Status: DC

## 2017-09-09 MED ORDER — HYDROMORPHONE HCL 1 MG/ML IJ SOLN
0.2500 mg | INTRAMUSCULAR | Status: DC | PRN
  Administered 2017-09-09 (×4): 0.25 mg via INTRAVENOUS

## 2017-09-09 MED ORDER — DEXTROSE 5 % IV SOLN
INTRAVENOUS | Status: AC
  Filled 2017-09-09: qty 1.5

## 2017-09-09 MED ORDER — FENTANYL CITRATE (PF) 100 MCG/2ML IJ SOLN: 25.0000 ug | INTRAMUSCULAR | Status: DC | PRN

## 2017-09-09 MED ORDER — POTASSIUM CHLORIDE 10 MEQ/50ML IV SOLN: 10.0000 meq | Freq: Every day | INTRAVENOUS | Status: DC | PRN

## 2017-09-09 MED ORDER — LIDOCAINE 2% (20 MG/ML) 5 ML SYRINGE
INTRAMUSCULAR | Status: AC
  Filled 2017-09-09: qty 5

## 2017-09-09 MED ORDER — DEXAMETHASONE SODIUM PHOSPHATE 10 MG/ML IJ SOLN
INTRAMUSCULAR | Status: DC | PRN
  Administered 2017-09-09: 10 mg via INTRAVENOUS

## 2017-09-09 MED ORDER — SUFENTANIL CITRATE 50 MCG/ML IV SOLN
INTRAVENOUS | Status: DC | PRN
  Administered 2017-09-09 (×2): 5 ug via INTRAVENOUS
  Administered 2017-09-09: 10 ug via INTRAVENOUS
  Administered 2017-09-09: 5 ug via INTRAVENOUS

## 2017-09-09 MED ORDER — METOCLOPRAMIDE HCL 5 MG/ML IJ SOLN
10.0000 mg | Freq: Four times a day (QID) | INTRAMUSCULAR | Status: AC
  Administered 2017-09-09 – 2017-09-10 (×4): 10 mg via INTRAVENOUS
  Filled 2017-09-09 (×4): qty 2

## 2017-09-09 MED ORDER — SUGAMMADEX SODIUM 200 MG/2ML IV SOLN
INTRAVENOUS | Status: DC | PRN
  Administered 2017-09-09: 200 mg via INTRAVENOUS

## 2017-09-09 MED ORDER — LUNG SURGERY BOOK
Freq: Once | Status: AC
  Administered 2017-09-09: 21:00:00
  Filled 2017-09-09: qty 1

## 2017-09-09 MED ORDER — NALOXONE HCL 0.4 MG/ML IJ SOLN: 0.4000 mg | INTRAMUSCULAR | Status: DC | PRN

## 2017-09-09 MED ORDER — SODIUM CHLORIDE 0.9 % IV SOLN: Freq: Once | INTRAVENOUS | Status: DC

## 2017-09-09 MED ORDER — ONDANSETRON HCL 4 MG/2ML IJ SOLN
4.0000 mg | Freq: Four times a day (QID) | INTRAMUSCULAR | Status: DC | PRN
  Administered 2017-09-12: 4 mg via INTRAVENOUS
  Filled 2017-09-09: qty 2

## 2017-09-09 MED ORDER — LIDOCAINE HCL (CARDIAC) 20 MG/ML IV SOLN
INTRAVENOUS | Status: DC | PRN
  Administered 2017-09-09: 60 mg via INTRAVENOUS

## 2017-09-09 MED ORDER — ALBUTEROL SULFATE (2.5 MG/3ML) 0.083% IN NEBU: 2.5000 mg | INHALATION_SOLUTION | Freq: Once | RESPIRATORY_TRACT | Status: DC

## 2017-09-09 MED ORDER — OXYCODONE HCL 5 MG PO TABS
5.0000 mg | ORAL_TABLET | ORAL | Status: DC | PRN
  Administered 2017-09-09: 5 mg via ORAL
  Filled 2017-09-09: qty 1

## 2017-09-09 MED ORDER — ROCURONIUM BROMIDE 10 MG/ML (PF) SYRINGE
PREFILLED_SYRINGE | INTRAVENOUS | Status: AC
  Filled 2017-09-09: qty 5

## 2017-09-09 MED ORDER — LACTATED RINGERS IV SOLN
INTRAVENOUS | Status: DC | PRN
  Administered 2017-09-09: 14:00:00 via INTRAVENOUS

## 2017-09-09 MED ORDER — HYDROMORPHONE HCL 1 MG/ML IJ SOLN
0.5000 mg | Freq: Once | INTRAMUSCULAR | Status: AC
Start: 2017-09-09 — End: 2017-09-09
  Administered 2017-09-09: 0.5 mg via INTRAVENOUS
  Filled 2017-09-09: qty 1

## 2017-09-09 MED ORDER — BISACODYL 5 MG PO TBEC
10.0000 mg | DELAYED_RELEASE_TABLET | Freq: Every day | ORAL | Status: DC
  Administered 2017-09-09 – 2017-09-11 (×3): 10 mg via ORAL
  Filled 2017-09-09 (×4): qty 2

## 2017-09-09 MED ORDER — ALBUTEROL SULFATE (2.5 MG/3ML) 0.083% IN NEBU: 2.5000 mg | INHALATION_SOLUTION | Freq: Four times a day (QID) | RESPIRATORY_TRACT | Status: DC | PRN

## 2017-09-09 MED ORDER — FENTANYL 40 MCG/ML IV SOLN
INTRAVENOUS | Status: DC
  Administered 2017-09-09: 105 ug via INTRAVENOUS
  Administered 2017-09-09: 1000 ug via INTRAVENOUS
  Administered 2017-09-10: 75 ug via INTRAVENOUS
  Administered 2017-09-10: 105 ug via INTRAVENOUS
  Administered 2017-09-10 (×2): 45 ug via INTRAVENOUS
  Administered 2017-09-10 (×2): 105 ug via INTRAVENOUS
  Administered 2017-09-11: 01:00:00 via INTRAVENOUS
  Administered 2017-09-11: 225 ug via INTRAVENOUS
  Administered 2017-09-11: 120 ug via INTRAVENOUS
  Administered 2017-09-11: 150 ug via INTRAVENOUS
  Administered 2017-09-11: 225 ug via INTRAVENOUS
  Administered 2017-09-11: 127 ug via INTRAVENOUS
  Administered 2017-09-11: 225 ug via INTRAVENOUS
  Administered 2017-09-11 – 2017-09-12 (×2): 120 ug via INTRAVENOUS
  Administered 2017-09-12: 1000 ug via INTRAVENOUS
  Administered 2017-09-12: 90 ug via INTRAVENOUS
  Administered 2017-09-13: 105 ug via INTRAVENOUS
  Filled 2017-09-09 (×4): qty 25

## 2017-09-09 SURGICAL SUPPLY — 84 items
APPLICATOR TIP EXT COSEAL (VASCULAR PRODUCTS) IMPLANT
BRUSH CYTOL CELLEBRITY 1.5X140 (MISCELLANEOUS) IMPLANT
CANISTER SUCT 3000ML PPV (MISCELLANEOUS) ×3 IMPLANT
CATH KIT ON Q 5IN SLV (PAIN MANAGEMENT) IMPLANT
CATH THORACIC 28FR (CATHETERS) ×3 IMPLANT
CATH THORACIC 28FR RT ANG (CATHETERS) IMPLANT
CATH THORACIC 36FR (CATHETERS) IMPLANT
CATH THORACIC 36FR RT ANG (CATHETERS) IMPLANT
CLEANER TIP ELECTROSURG 2X2 (MISCELLANEOUS) ×3 IMPLANT
CLIP VESOCCLUDE MED 6/CT (CLIP) ×3 IMPLANT
CONN ST 1/4X3/8  BEN (MISCELLANEOUS) ×2
CONN ST 1/4X3/8 BEN (MISCELLANEOUS) ×4 IMPLANT
CONN Y 3/8X3/8X3/8  BEN (MISCELLANEOUS) ×1
CONN Y 3/8X3/8X3/8 BEN (MISCELLANEOUS) ×2 IMPLANT
CONT SPEC 4OZ CLIKSEAL STRL BL (MISCELLANEOUS) ×12 IMPLANT
CUTTER ECHEON FLEX ENDO 45 340 (ENDOMECHANICALS) ×3 IMPLANT
DERMABOND ADVANCED (GAUZE/BANDAGES/DRESSINGS)
DERMABOND ADVANCED .7 DNX12 (GAUZE/BANDAGES/DRESSINGS) IMPLANT
DRAIN CHANNEL 28F RND 3/8 FF (WOUND CARE) ×3 IMPLANT
DRAIN CHANNEL 32F RND 10.7 FF (WOUND CARE) ×3 IMPLANT
DRAPE LAPAROSCOPIC ABDOMINAL (DRAPES) ×3 IMPLANT
ELECT BLADE 6.5 EXT (BLADE) ×3 IMPLANT
ELECT CAUTERY BLADE 6.4 (BLADE) ×3 IMPLANT
ELECT REM PT RETURN 9FT ADLT (ELECTROSURGICAL) ×3
ELECTRODE REM PT RTRN 9FT ADLT (ELECTROSURGICAL) ×2 IMPLANT
FILTER STRAW FLUID ASPIR (MISCELLANEOUS) IMPLANT
FORCEPS BIOP RJ4 1.8 (CUTTING FORCEPS) IMPLANT
FORCEPS RADIAL JAW LRG 4 PULM (INSTRUMENTS) IMPLANT
GAUZE SPONGE 4X4 12PLY STRL (GAUZE/BANDAGES/DRESSINGS) ×3 IMPLANT
GLOVE SURG SIGNA 7.5 PF LTX (GLOVE) ×6 IMPLANT
GOWN STRL REUS W/ TWL LRG LVL3 (GOWN DISPOSABLE) ×6 IMPLANT
GOWN STRL REUS W/ TWL XL LVL3 (GOWN DISPOSABLE) ×4 IMPLANT
GOWN STRL REUS W/TWL LRG LVL3 (GOWN DISPOSABLE) ×3
GOWN STRL REUS W/TWL XL LVL3 (GOWN DISPOSABLE) ×2
HANDLE STAPLE ENDO GIA SHORT (STAPLE)
HEMOSTAT SURGICEL 2X14 (HEMOSTASIS) IMPLANT
KIT BASIN OR (CUSTOM PROCEDURE TRAY) ×3 IMPLANT
KIT CLEAN ENDO COMPLIANCE (KITS) ×3 IMPLANT
KIT ROOM TURNOVER OR (KITS) ×3 IMPLANT
MARKER SKIN DUAL TIP RULER LAB (MISCELLANEOUS) ×3 IMPLANT
NS IRRIG 1000ML POUR BTL (IV SOLUTION) ×9 IMPLANT
OIL SILICONE PENTAX (PARTS (SERVICE/REPAIRS)) ×3 IMPLANT
PACK CHEST (CUSTOM PROCEDURE TRAY) ×3 IMPLANT
PAD ARMBOARD 7.5X6 YLW CONV (MISCELLANEOUS) ×6 IMPLANT
POUCH ENDO CATCH II 15MM (MISCELLANEOUS) IMPLANT
POUCH SPECIMEN RETRIEVAL 10MM (ENDOMECHANICALS) IMPLANT
RADIAL JAW LRG 4 PULMONARY (INSTRUMENTS)
SEALANT PROGEL (MISCELLANEOUS) IMPLANT
SEALANT SURG COSEAL 4ML (VASCULAR PRODUCTS) IMPLANT
SEALANT SURG COSEAL 8ML (VASCULAR PRODUCTS) IMPLANT
SOLUTION ANTI FOG 6CC (MISCELLANEOUS) ×3 IMPLANT
SPONGE INTESTINAL PEANUT (DISPOSABLE) ×21 IMPLANT
SPONGE TONSIL 1 RF SGL (DISPOSABLE) ×3 IMPLANT
STAPLE RELOAD 45MM GOLD (STAPLE) ×3 IMPLANT
STAPLER ENDO GIA 12MM SHORT (STAPLE) IMPLANT
SUT PROLENE 4 0 RB 1 (SUTURE)
SUT PROLENE 4-0 RB1 .5 CRCL 36 (SUTURE) IMPLANT
SUT SILK  1 MH (SUTURE) ×2
SUT SILK 1 MH (SUTURE) ×4 IMPLANT
SUT SILK 1 TIES 10X30 (SUTURE) IMPLANT
SUT SILK 2 0SH CR/8 30 (SUTURE) IMPLANT
SUT SILK 3 0SH CR/8 30 (SUTURE) IMPLANT
SUT VIC AB 1 CTX 36 (SUTURE) ×1
SUT VIC AB 1 CTX36XBRD ANBCTR (SUTURE) ×2 IMPLANT
SUT VIC AB 2-0 CTX 36 (SUTURE) ×3 IMPLANT
SUT VIC AB 3-0 MH 27 (SUTURE) IMPLANT
SUT VIC AB 3-0 X1 27 (SUTURE) IMPLANT
SUT VICRYL 2 TP 1 (SUTURE) IMPLANT
SYR 10ML LL (SYRINGE) IMPLANT
SYR 20ML ECCENTRIC (SYRINGE) ×3 IMPLANT
SYR 5ML LL (SYRINGE) ×3 IMPLANT
SYR 5ML LUER SLIP (SYRINGE) ×3 IMPLANT
SYSTEM SAHARA CHEST DRAIN RE-I (WOUND CARE) ×3 IMPLANT
TAPE CLOTH 4X10 WHT NS (GAUZE/BANDAGES/DRESSINGS) ×3 IMPLANT
TAPE CLOTH SURG 4X10 WHT LF (GAUZE/BANDAGES/DRESSINGS) ×3 IMPLANT
TIP APPLICATOR SPRAY EXTEND 16 (VASCULAR PRODUCTS) IMPLANT
TOWEL GREEN STERILE (TOWEL DISPOSABLE) ×6 IMPLANT
TOWEL GREEN STERILE FF (TOWEL DISPOSABLE) ×3 IMPLANT
TRAP SPECIMEN MUCOUS 40CC (MISCELLANEOUS) ×6 IMPLANT
TRAY FOLEY W/METER SILVER 16FR (SET/KITS/TRAYS/PACK) ×3 IMPLANT
TROCAR XCEL BLADELESS 5X75MML (TROCAR) ×3 IMPLANT
TUBE CONNECTING 20X1/4 (TUBING) ×3 IMPLANT
TUNNELER SHEATH ON-Q 11GX8 DSP (PAIN MANAGEMENT) IMPLANT
WATER STERILE IRR 1000ML POUR (IV SOLUTION) ×3 IMPLANT

## 2017-09-09 NOTE — Progress Notes (Signed)
PROGRESS NOTE  Aaron Schmidt PYP:[id number] DOB: 1933/04/01 DOA: 09/04/2017 PCP: Hulan Fess, MD  HPI/Recap of past 24 hours: Aaron Schmidt is a 83 y.o. male with medical history significant of HTN, HLD, diastolic dysfunction with last EF 60-65% with grade 1on 12/2016, chronic back pain, s/p multiple spinal surgeries, BPH, OSA; who presented with complaints of generalized weakness and shortness of breath. Initially, he complained of left thoracic back pain and had been found to have a left-sided loculated pleural effusion on 11/20.  He was referred to Dr. Melvyn Novas of pulmonology on 11/26, but thoracentesis only drained about 200 mL of fluid that revealed a significantly elevated LDH but negative cytology.  Subsequently, patient was sent to be evaluated by Dr. Gorden Harms of cardiothoracic surgery for planned video bronchoscopy, assisted thoracoscopy, and possible decortication. Missed appointment.On admission ED 101.2 F, WBC 18.8. Chest x-ray showing a moderate size loculated left pleural effusion.  No acute events reported overnight. Scheduled for video bronchoscopy, left video assisted thoracoscopy, drain effusion, possible decortication today 09/09/17. Pt seen and examined at his bedside. No complaints at this time.   Assessment/Plan: Principal Problem:   Pleural effusion, left Active Problems:   Essential hypertension   SIRS (systemic inflammatory response syndrome) (HCC)   Elevated troponin   Normochromic normocytic anemia   Hyponatremia   Acute kidney injury superimposed on chronic kidney disease (HCC)   Chronic pain  Left Pleural effusion, present on admission:  -Unresolved.  Noted to have left-sided pleural effusion on 11/20.  -Status post left thoracentesis with 220 cc hazy yellow fluid removed which revealed elevated LDH level, but cytology was negative 08/20/17.   -Previously planned bronchoscopy for 11/12 was canceled.  -Continuous pulse oximetry with nasal cannula oxygen as  needed -DuoNeb's prn shortness of breath -cardiothoracic surgery following and plans for VATS today 09/09/17  Sepsis 2/2 to suspected HCAP, poa - cxr 09/04/17 revealed left pleural effusion and RML infiltrates - possible HCAP - swallow eval by speech mild aspiration risk- Regular thin liquids - on admission: WBC elevated at 18.8 with fever up to 101.2 F.  - Sepsis protocol initiated on admission - blood cultures x 2 no growth - IV Vancomycin and Zosyn day #5 - lactic acid 0.7 (09/05/17) from 0.8 - afebrile  Acute kidney injury on chronic kidney disease -resolving -baseline creatinine 1.1. cr 1.68 on presentation -cr 0.93 from 1.03 (09/06/17) from 1.23 from 1.37 from 1.68 -avoid nephrotoxic agents/hypotension - IV fluids Normal Saline reduced to 50 ml/hr as tolerated - BMP in a.m.  Hypokalemia, resolved -K+ 3.5 from 3.4 from 3.2 -replete as indicated -BMP am  Chronic normocytic normochromic anemia -stable -hg 9.2 from 9.6 from 8.9 from 10.6 -baseline hg 11 -no sign of overt bleeding -cbc am  Hyponatremia, resolved -unclear etiology- - Na+ 138 from 137 from 132 from 129 -BMP am   Chronic lower back pain,  chronic opioid use -multiple back surgeries -Dilaudid, Lyrica, Flexeril -Continue home pain regimen  Essential hypertension -BP is stable -hold hctz due to AKI  Hyperlipidemia -Continue simvastatin  BPH -stable -Continue Proscar    Code Status: stable   Family Communication: No family member at bedside  Disposition Plan: willl stay another midnight to continue medical management after VATS today 09/09/17 by CTS.   Consultants:  Cardiothoracic surgery  Procedures:  none  Antimicrobials:  IV vanc and zosyn day #5  DVT prophylaxis:  heparin 5000 u sq tid-on hold for procedure 09/09/17   Objective: Vitals:   09/08/17 2458  09/08/17 1443 09/08/17 2131 09/09/17 0527  BP: 129/63 123/65 138/65 136/64  Pulse: 93 88 93 96  Resp: 18 16  20 18   Temp: 98 F (36.7 C) (!) 97.4 F (36.3 C) 98.1 F (36.7 C) 98.3 F (36.8 C)  TempSrc: Oral Oral Oral Oral  SpO2: 96% 99% 100% 98%  Weight:      Height:        Intake/Output Summary (Last 24 hours) at 09/09/2017 0746 Last data filed at 09/09/2017 0641 Gross per 24 hour  Intake 240 ml  Output 1450 ml  Net -1210 ml   Filed Weights   09/04/17 1226 09/04/17 1907 09/05/17 0500  Weight: 79.8 kg (176 lb) 84.2 kg (185 lb 10 oz) 84.2 kg (185 lb 10 oz)    Exam:   General:  83 yo CM WD WN uncomfortable due to chronic lower back pain   Cardiovascular: RRR no rubs or gallops  Respiratory: no wheezes. Mild rales at bases bilaterally  Abdomen: soft NT ND NBS x4  Musculoskeletal: moves all 4 non focal  Skin: no noted open lesions  Psychiatry: Mood is appropriate    Data Reviewed: CBC: Recent Labs  Lab 09/04/17 1349 09/05/17 0227 09/06/17 0354 09/07/17 0449 09/09/17 0223  WBC 18.8* 15.2* 13.8* 14.6* 14.1*  NEUTROABS 17.2*  --   --   --   --   HGB 10.6* 8.9* 9.6* 9.5* 9.5*  HCT 32.3* 27.2* 30.1* 29.4* 29.9*  MCV 93.6 92.2 93.2 93.0 92.9  PLT 313 280 329 358 355   Basic Metabolic Panel: Recent Labs  Lab 09/04/17 1349 09/05/17 0227 09/06/17 0354 09/07/17 0449 09/09/17 0223  NA 129* 132* 137 137 136  K 3.7 3.5 3.2* 3.4* 3.5  CL 94* 98* 101 104 103  CO2 25 26 27 26 25   GLUCOSE 170* 127* 116* 110* 109*  BUN 38* 30* 21* 12 9  CREATININE 1.68* 1.37* 1.23 1.03 0.92  CALCIUM 8.5* 8.1* 8.3* 7.9* 8.1*   GFR: Estimated Creatinine Clearance: 63.9 mL/min (by C-G formula based on SCr of 0.92 mg/dL). Liver Function Tests: Recent Labs  Lab 09/05/17 0227  AST 89*  ALT 76*  ALKPHOS 100  BILITOT 0.6  PROT 5.8*  ALBUMIN 2.0*   No results for input(s): LIPASE, AMYLASE in the last 168 hours. No results for input(s): AMMONIA in the last 168 hours. Coagulation Profile: No results for input(s): INR, PROTIME in the last 168 hours. Cardiac Enzymes: Recent Labs    Lab 09/04/17 1349  TROPONINI 0.05*   BNP (last 3 results) No results for input(s): PROBNP in the last 8760 hours. HbA1C: No results for input(s): HGBA1C in the last 72 hours. CBG: Recent Labs  Lab 09/08/17 0836  GLUCAP 100*   Lipid Profile: No results for input(s): CHOL, HDL, LDLCALC, TRIG, CHOLHDL, LDLDIRECT in the last 72 hours. Thyroid Function Tests: No results for input(s): TSH, T4TOTAL, FREET4, T3FREE, THYROIDAB in the last 72 hours. Anemia Panel: No results for input(s): VITAMINB12, FOLATE, FERRITIN, TIBC, IRON, RETICCTPCT in the last 72 hours. Urine analysis:    Component Value Date/Time   COLORURINE YELLOW 09/04/2017 [place] 09/04/2017 1445   LABSPEC 1.015 09/04/2017 1445   PHURINE 6.0 09/04/2017 1445   GLUCOSEU NEGATIVE 09/04/2017 1445   HGBUR NEGATIVE 09/04/2017 [place] 09/04/2017 1445   KETONESUR NEGATIVE 09/04/2017 1445   PROTEINUR NEGATIVE 09/04/2017 1445   UROBILINOGEN 0.2 10/20/2010 1625   NITRITE NEGATIVE 09/04/2017 1445   LEUKOCYTESUR NEGATIVE 09/04/2017  1445   Sepsis Labs: @LABRCNTIP (procalcitonin:4,lacticidven:4)  ) Recent Results (from the past 240 hour(s))  Culture, blood (x 2)     Status: None (Preliminary result)   Collection Time: 09/04/17 10:30 PM  Result Value Ref Range Status   Specimen Description BLOOD LEFT ARM  Final   Special Requests   Final    BOTTLES DRAWN AEROBIC AND ANAEROBIC Blood Culture adequate volume   Culture NO GROWTH 3 DAYS  Final   Report Status PENDING  Incomplete  Culture, blood (x 2)     Status: None (Preliminary result)   Collection Time: 09/04/17 10:38 PM  Result Value Ref Range Status   Specimen Description BLOOD RIGHT ARM  Final   Special Requests IN PEDIATRIC BOTTLE Blood Culture adequate volume  Final   Culture NO GROWTH 3 DAYS  Final   Report Status PENDING  Incomplete  MRSA PCR Screening     Status: None   Collection Time: 09/08/17 10:31 PM  Result Value Ref Range  Status   MRSA by PCR NEGATIVE NEGATIVE Final    Comment:        The GeneXpert MRSA Assay (FDA approved for NASAL specimens only), is one component of a comprehensive MRSA colonization surveillance program. It is not intended to diagnose MRSA infection nor to guide or monitor treatment for MRSA infections.       Studies: No results found.  Scheduled Meds: . bisacodyl  10 mg Rectal Once  . feeding supplement  237 mL Oral TID WC  . feeding supplement (ENSURE ENLIVE)  237 mL Oral BID BM  . finasteride  5 mg Oral Daily  . heparin injection (subcutaneous)  5,000 Units Subcutaneous Q8H  .  HYDROmorphone (DILAUDID) injection  0.5 mg Intravenous Once  . nortriptyline  25 mg Oral QHS  . pregabalin  150 mg Oral BID  . senna  2 tablet Oral Daily  . simvastatin  40 mg Oral QPM    Continuous Infusions: . sodium chloride 50 mL/hr at 09/07/17 1333  . piperacillin-tazobactam (ZOSYN)  IV 3.375 g (09/09/17 3532)  . vancomycin Stopped (09/08/17 2248)     LOS: 5 days     Kayleen Memos, MD [facility] Pager [phone]  If 7PM-7AM, please contact night-coverage [email or web address] Password [email or web address] 09/09/2017, 7:46 AM

## 2017-09-09 NOTE — Interval H&P Note (Signed)
History and Physical Interval Note:  09/09/2017 1:51 PM Admitted to hospital in interim since this note written. Patient cancelled surgery scheduled for next week, then presented to ED with worsening SOB, pain, fever. Started on empiric antibiotics with improvement in fever and WBC. CXR this AM shows increased left pleural effusion.  See progress notes from last Thurs/ [place]  has presented today for surgery, with the diagnosis of Loculated Left Pleural Effusion  The various methods of treatment have been discussed with the patient and family. After consideration of risks, benefits and other options for treatment, the patient has consented to  Procedure(s): VIDEO BRONCHOSCOPY, LEFT VIDEO ASSISTED THORACOSCOPY, DRAIN EFFUSION,POSSIBLE DECORTICATION (N/A) VIDEO ASSISTED THORACOSCOPY (VATS)/DECORTICATION (Left) DRAINAGE OF PLEURAL EFFUSION (Left) as a surgical intervention .  The patient's history has been reviewed, patient examined, no change in status, stable for surgery.  I have reviewed the patient's chart and labs.  Questions were answered to the patient's satisfaction.     [name]

## 2017-09-09 NOTE — Op Note (Signed)
[name], [name]               ACCOUNT NO.:  [id number]  MEDICAL RECORD NO.:  [id number]  LOCATION:  5W10C                        FACILITY:  [facility]  PHYSICIAN:  Revonda Standard. Roxan Hockey, M.D.DATE OF BIRTH:  Mar 01, 1933  DATE OF PROCEDURE:  09/09/2017 DATE OF DISCHARGE:                              OPERATIVE REPORT   PREOPERATIVE DIAGNOSIS:  Loculated left pleural effusion.  POSTOPERATIVE DIAGNOSIS:  Left Empyema.  PROCEDURES: 1. Video bronchoscopy with bronchoalveolar lavage. 2. Left video-assisted thoracoscopy. 3. Drainage of empyema. 4. Visceral and parietal pleural decortication.  SURGEON:  Revonda Standard. Roxan Hockey, MD.  ASSISTANTEllwood Handler, PA.  ANESTHESIA:  General.  FINDINGS:  Bronchoscopy revealed extrinsic compression of left lower lobe bronchi.  No endobronchial mass lesions were seen.  Bronchoalveolar lavage was performed with minimal return.  Left VATS- dense adhesions. Murky inflammatory fluid anteriorly and in the fissure. Posteriorly in the lower chest there was an empyema with pus and thick visceral and parietal pleural peels.  CLINICAL NOTE:  Aaron Schmidt is an 83 year old gentleman who has about a 3- week history of shortness of breath and pleuritic left-sided chest pain. He was found to have a loculated pleural effusion.  Thoracentesis drained about 200 mL of fluid, but did not have any effect on the appearance of the chest x-ray.  LDH was markedly elevated.  Cytologies were negative.  The patient was referred for video-assisted thoracoscopy and was originally scheduled for last week, but he canceled due to weather. He then was admitted to the emergency room with fevers and progressive shortness of breath.  He was started on antibiotics and defervesced rapidly.  He was advised to undergo left VATS for drainage of pleural effusion and possible decortication.  The indications, risks, benefits, and alternatives were discussed in detail with the patient.   He understood and accepted the risks and agreed to proceed.  He also was advised to have bronchoscopy at the time of the procedure to rule out any endobronchial obstructing lesions.  OPERATIVE NOTE:  Aaron Schmidt was brought to the operating room on September 09, 2017.  He had induction of general anesthesia and was intubated with a single-lumen endotracheal tube.  After performing a time-out, flexible fiberoptic bronchoscopy was performed via the endotracheal tube.  There was extrinsic compression of the lower lobe bronchi, but no endobronchial mass lesions were seen.  Bronchoalveolar lavage was performed from the left lower lobe bronchus.  The remainder of the bronchial tree was within normal limits.  The bronchoscope was removed.  Dr. Ola Spurr placed a central line.  It should be noted that a Foley catheter was placed and sequential compression devices were in place as well.  The patient was given intravenous Zinacef in addition to the vancomycin and Zosyn he was already receiving.  Dr. Ola Spurr attempted to change the single tube to a double-lumen endotracheal tube, but was not able to successfully pass the double-lumen tube. Ultimately, a bronchial blocker was placed into the left mainstem bronchus and inflated.  The patient did tolerate this well.  He then was placed in a right lateral decubitus position and the left chest was prepped and draped in usual sterile fashion.  There was some edema  in the skin and subcutaneous tissue on the left chest.  A 5-cm working incision was made in the fourth interspace anterolaterally.  No rib spreading was performed during the procedure.  The intercostal muscles were divided and there were dense adhesions in this area.  These were taken down bluntly with finger tip dissection.  There was some murky fluid that was sent for cultures.  The scope was placed through the incision to assist with visualization.  A port incision was made in the seventh  interspace in the midaxillary line.  A 5-mm port was inserted. The thoracoscope then was advanced through the port and remained there through the remainder of the procedure.  Adhesions were taken down. This was a slow and difficult process.  The adhesions of the upper lobe to the parietal pleura actually were very minimal and there was no real peel on the upper lobe.  There was some fibrinous exudate and also murky fluid around the upper lobe, but as the lower lobe was approached there were dense adhesions of the lung to the chest wall, particularly posteriorly and along the diaphragm.  These adhesions were taken down using blunt dissection and part of the visceral decortication was performed while taking down these adhesions.  Posterolaterally in the costophrenic angle, a cavity was entered that contained frank pus and purulent debris.  This was sent for cultures as well.  This cavity was opened up.  The remainder of the lung was freed up circumferentially except for a very small area of the superior segment to the chest wall. This was rather friable tissue.  The visceral decortication then was completed.  There were areas of very thick, chronic peel on the lower lobe.  This was a slow and tedious dissection.  There were multiple visceral pleural tears and small air leaks.  The parietal pleural decortication was performed and that tissue was very friable as well. Multiple times during the procedure the chest was copiously irrigated with warm saline to evacuate any blood and clot.  After removing the vast majority of the peel, a test inflation was done by deflating the bronchial blocker and there was good re-expansion of the left lung.  Two additional port incisions were made below the primary incision and a 28- French chest tube, 28-French Blake drain, and 32-French Blake drain all were placed through separate incisions and secured to skin with #1 silk sutures.  Final irrigation was  performed and dual lung ventilation was resumed.  The fascia was closed with a #1 Vicryl suture.  The subcutaneous tissue and skin were closed in standard fashion.  All sponge, needle, and instrument counts were correct at the end of the procedure.  The patient was taken from the operating room to the postanesthetic care unit extubated and in fair condition.     Revonda Standard Roxan Hockey, M.D.     SCH/MEDQ  D:  09/09/2017  T:  09/09/2017  Job:  [id number]

## 2017-09-09 NOTE — Transfer of Care (Signed)
Immediate Anesthesia Transfer of Care Note  Patient: Aaron Schmidt  Procedure(s) Performed: VIDEO ASSISTED THORACOSCOPY (VATS)/DECORTICATION (Left Chest) DRAINAGE OF PLEURAL EFFUSION (Left Chest) VIDEO BRONCHOSCOPY  Patient Location: PACU  Anesthesia Type:General  Level of Consciousness: awake and alert   Airway & Oxygen Therapy: Patient Spontanous Breathing and Patient connected to nasal cannula oxygen  Post-op Assessment: Report given to RN, Post -op Vital signs reviewed and stable and Patient moving all extremities X 4  Post vital signs: Reviewed and stable  Last Vitals:  Vitals:   09/08/17 2131 09/09/17 0527  BP: 138/65 136/64  Pulse: 93 96  Resp: 20 18  Temp: 36.7 C 36.8 C  SpO2: 100% 98%    Last Pain:  Vitals:   09/09/17 0745  TempSrc:   PainSc: 10-Worst pain ever      Patients Stated Pain Goal: 4 (51/83/43 7357)  Complications: No apparent anesthesia complications

## 2017-09-09 NOTE — Care Management Important Message (Signed)
Important Message  Patient Details  Name: Aaron Schmidt MRN: [id number] Date of Birth: 12/05/1932   Medicare Important Message Given:  Yes    Iris Bratton 09/09/2017, 12:15 PM

## 2017-09-09 NOTE — Progress Notes (Signed)
Patient went to OR for VATS. PCR and CHG complete. Received order for patient to go off tele for transport. Report called to Manuela Schwartz in short stay.

## 2017-09-09 NOTE — Brief Op Note (Addendum)
09/04/2017 - 09/09/2017  5:50 PM  PATIENT:  Aaron Schmidt  83 y.o. male  PRE-OPERATIVE DIAGNOSIS:  Loculated Left Pleural Effusion  POST-OPERATIVE DIAGNOSIS:  LEFT EMPYEMA  PROCEDURE:  Procedure(s): VIDEO BRONCHOSCOPY LEFT VIDEO ASSISTED THORACOSCOPY (VATS) DRAINAGE OF PLEURAL EFFUSION DECORTICATION   SURGEON:  Surgeon(s) and Role:    * [name], MD - Primary  PHYSICIAN ASSISTANT: Ellwood Handler PA-C  ANESTHESIA:   general  EBL:  150 mL   BLOOD ADMINISTERED:none  DRAINS: 28 Straight, 28 Blake, 68 Blake Drain   LOCAL MEDICATIONS USED:  NONE  SPECIMEN:  Source of Specimen:  Pleural Fluid, Pleural Peel, Visceral Pleural  DISPOSITION OF SPECIMEN:  Microbiology, Pathology  COUNTS:  YES  TOURNIQUET:  * No tourniquets in log *  DICTATION: .Dragon Dictation  PLAN OF CARE: Admit to inpatient   PATIENT DISPOSITION:  ICU - extubated and stable.   Delay start of Pharmacological VTE agent (>24hrs) due to surgical blood loss or risk of bleeding: yes  FINDINGS: empyema with frank pus and thick visceral and parietal pleural peels posteriorly. Extrinsic compression of LLL bronchi, no endobronchial mass

## 2017-09-09 NOTE — Anesthesia Procedure Notes (Signed)
Procedure Name: Intubation Date/Time: 09/09/2017 2:35 PM Performed by: Inda Coke, CRNA Pre-anesthesia Checklist: Patient identified, Emergency Drugs available, Suction available and Patient being monitored Patient Re-evaluated:Patient Re-evaluated prior to induction Oxygen Delivery Method: Circle System Utilized Preoxygenation: Pre-oxygenation with 100% oxygen Induction Type: IV induction Ventilation: Mask ventilation without difficulty Laryngoscope Size: Glidescope and 4 Grade View: Grade I Tube type: Oral Tube size: 8.5 mm Number of attempts: 1 Airway Equipment and Method: Stylet and Oral airway Placement Confirmation: ETT inserted through vocal cords under direct vision,  positive ETCO2 and breath sounds checked- equal and bilateral Secured at: 23 cm Tube secured with: Tape Dental Injury: Teeth and Oropharynx as per pre-operative assessment

## 2017-09-09 NOTE — Anesthesia Preprocedure Evaluation (Addendum)
Anesthesia Evaluation  Patient identified by MRN, date of birth, ID band Patient awake    Reviewed: Allergy & Precautions, NPO status , Patient's Chart, lab work & pertinent test results  Airway Mallampati: IV  TM Distance: >3 FB Neck ROM: Limited    Dental  (+) Dental Advisory Given   Pulmonary sleep apnea , former smoker,  Loculated left pleural effusion    + decreased breath sounds (on the left)      Cardiovascular hypertension, Pt. on medications + dysrhythmias  Rhythm:Regular Rate:Normal     Neuro/Psych negative neurological ROS     GI/Hepatic negative GI ROS, Neg liver ROS,   Endo/Other  negative endocrine ROS  Renal/GU Renal disease     Musculoskeletal  (+) Arthritis ,   Abdominal   Peds  Hematology  (+) anemia ,   Anesthesia Other Findings   Reproductive/Obstetrics                            Lab Results  Component Value Date   WBC 13.6 (H) 09/09/2017   HGB 9.2 (L) 09/09/2017   HCT 28.0 (L) 09/09/2017   MCV 94.6 09/09/2017   PLT 356 09/09/2017   Lab Results  Component Value Date   CREATININE 0.91 09/09/2017   BUN 8 09/09/2017   NA 138 09/09/2017   K 3.5 09/09/2017   CL 104 09/09/2017   CO2 25 09/09/2017    Anesthesia Physical Anesthesia Plan  ASA: IV  Anesthesia Plan: General   Post-op Pain Management:    Induction: Intravenous  PONV Risk Score and Plan: 3 and Dexamethasone, Treatment may vary due to age or medical condition and Ondansetron  Airway Management Planned: Oral ETT and Video Laryngoscope Planned  Additional Equipment: Arterial line, CVP and Ultrasound Guidance Line Placement  Intra-op Plan:   Post-operative Plan: Possible Post-op intubation/ventilation  Informed Consent: I have reviewed the patients History and Physical, chart, labs and discussed the procedure including the risks, benefits and alternatives for the proposed anesthesia with the  patient or authorized representative who has indicated his/her understanding and acceptance.   Dental advisory given  Plan Discussed with: CRNA  Anesthesia Plan Comments:         Anesthesia Quick Evaluation

## 2017-09-09 NOTE — Progress Notes (Signed)
Physical Therapy Treatment Patient Details Name: Aaron Schmidt MRN: [id number] DOB: 1933-02-25 Today's Date: 09/09/2017    History of Present Illness Patient is a 83 y/o male who presents with generalized weakness, fatigue, SOB and sleepiness. Recently diagnosed with left pleural effusion and scheduled for VATs and bronch but canceled due to snow. Found to have hyponatremia, dehydration, fevers and elevated WBC. CXR-left pleural effusion. Plan for possible VATs and bronch on Monday 12/17. PMH includes sleep apnea, HTN.     PT Comments    Pt progressing well with mobility and was able to maintain SpO2 >92% on RA. Aware pt is going for a VATS procedure today. PT to re-assess pt as appropriate s/p procedure.    Follow Up Recommendations  Home health PT;Supervision for mobility/OOB     Equipment Recommendations  None recommended by PT    Recommendations for Other Services       Precautions / Restrictions Precautions Precautions: Fall Precaution Comments: planned for VATS procedure today Restrictions Weight Bearing Restrictions: No    Mobility  Bed Mobility Overal bed mobility: Needs Assistance Bed Mobility: Supine to Sit Rolling: Supervision   Supine to sit: HOB elevated     General bed mobility comments: no physical assist required, used bed rail, increased time  Transfers Overall transfer level: Needs assistance Equipment used: Rolling walker (2 wheeled) Transfers: Sit to/from Stand Sit to Stand: Min guard         General transfer comment: v/c's to push up from walker not pull up on it  Ambulation/Gait Ambulation/Gait assistance: Min guard Ambulation Distance (Feet): 160 Feet Assistive device: Rolling walker (2 wheeled) Gait Pattern/deviations: Step-through pattern;Trunk flexed;Shuffle;Decreased stride length Gait velocity: dec   General Gait Details: pt with improved step height and length compared to previous notes. pt with mild SOB however SpO2 >92% on  RA.   Stairs            Wheelchair Mobility    Modified Rankin (Stroke Patients Only)       Balance Overall balance assessment: Needs assistance Sitting-balance support: Feet supported;No upper extremity supported Sitting balance-Leahy Scale: Good     Standing balance support: During functional activity Standing balance-Leahy Scale: Good Standing balance comment: able to stand at sink and wash hands without LOB                            Cognition Arousal/Alertness: Awake/alert Behavior During Therapy: WFL for tasks assessed/performed Overall Cognitive Status: Within Functional Limits for tasks assessed                                        Exercises      General Comments General comments (skin integrity, edema, etc.): assisted pt to bathroom, pt able to transfer on/off commode with use of rail with supervision      Pertinent Vitals/Pain Pain Assessment: No/denies pain    Home Living                      Prior Function            PT Goals (current goals can now be found in the care plan section) Progress towards PT goals: Progressing toward goals    Frequency    Min 3X/week      PT Plan Current plan remains appropriate    Co-evaluation  AM-PAC PT "6 Clicks" Daily Activity  Outcome Measure  Difficulty turning over in bed (including adjusting bedclothes, sheets and blankets)?: None Difficulty moving from lying on back to sitting on the side of the bed? : None Difficulty sitting down on and standing up from a chair with arms (e.g., wheelchair, bedside commode, etc,.)?: A Little Help needed moving to and from a bed to chair (including a wheelchair)?: A Little Help needed walking in hospital room?: A Little Help needed climbing 3-5 steps with a railing? : A Lot 6 Click Score: 19    End of Session Equipment Utilized During Treatment: Gait belt Activity Tolerance: Patient tolerated treatment  well Patient left: in chair;with call bell/phone within reach;with family/visitor present Nurse Communication: Mobility status PT Visit Diagnosis: Unsteadiness on feet (R26.81);Muscle weakness (generalized) (M62.81);Difficulty in walking, not elsewhere classified (R26.2)     Time: 1135-1200 PT Time Calculation (min) (ACUTE ONLY): 25 min  Charges:  $Gait Training: 8-22 mins $Therapeutic Activity: 8-22 mins                    G Codes:        Kittie Plater, PT, DPT Pager #: [phone] Office #: [phone]    [place] 09/09/2017, 1:40 PM

## 2017-09-09 NOTE — Anesthesia Procedure Notes (Signed)
Procedure Name: Intubation Date/Time: 09/09/2017 3:32 PM Performed by: Inda Coke, CRNA Pre-anesthesia Checklist: Patient identified, Emergency Drugs available, Suction available and Patient being monitored Patient Re-evaluated:Patient Re-evaluated prior to induction Oxygen Delivery Method: Circle System Utilized Preoxygenation: Pre-oxygenation with 100% oxygen Induction Type: IV induction Ventilation: Mask ventilation without difficulty and Oral airway inserted - appropriate to patient size Laryngoscope Size: Glidescope and 4 Grade View: Grade I Tube type: Oral Endobronchial tube: Bronchial Blocker placed under direct vision and Left Number of attempts: 1 Airway Equipment and Method: Stylet and Oral airway Placement Confirmation: ETT inserted through vocal cords under direct vision,  positive ETCO2 and breath sounds checked- equal and bilateral Secured at: 24 cm Tube secured with: Tape Dental Injury: Teeth and Oropharynx as per pre-operative assessment  Comments: Glidescope visualization of ETT, cook catheter placed through ETT, ETT removed and DLT guided over cook catheter with difficulty. Cook catheter positioned between enlarged arytenoids and unable to pass DLT X 2.  Removed cook catheter and mask ventilated.  Glidescope intubation with ETT successful and left bronchial blocker placed under direct visualization with bronchoscope per Dr. Rodman Comp.

## 2017-09-09 NOTE — Progress Notes (Signed)
Patient had a CHG bath before midnight and this morning at 0600.

## 2017-09-09 NOTE — Anesthesia Procedure Notes (Signed)
Central Venous Catheter Insertion Performed by: Suzette Battiest, MD, anesthesiologist Start/End12/17/2018 2:35 PM, 09/09/2017 2:45 PM Patient location: Pre-op. Preanesthetic checklist: patient identified, IV checked, site marked, risks and benefits discussed, surgical consent, monitors and equipment checked, pre-op evaluation, timeout performed and anesthesia consent Position: Trendelenburg Lidocaine 1% used for infiltration and patient sedated Hand hygiene performed , maximum sterile barriers used  and Seldinger technique used Catheter size: 8 Fr Total catheter length 16. Central line was placed.Double lumen Procedure performed using ultrasound guided technique. Ultrasound Notes:anatomy identified, needle tip was noted to be adjacent to the nerve/plexus identified, no ultrasound evidence of intravascular and/or intraneural injection and image(s) printed for medical record Attempts: 1 Following insertion, dressing applied, line sutured and Biopatch. Post procedure assessment: blood return through all ports  Patient tolerated the procedure well with no immediate complications.

## 2017-09-10 ENCOUNTER — Other Ambulatory Visit (HOSPITAL_COMMUNITY): Payer: Self-pay | Admitting: Respiratory Therapy

## 2017-09-10 ENCOUNTER — Encounter (HOSPITAL_COMMUNITY): Payer: Self-pay | Admitting: Thoracic Surgery (Cardiothoracic Vascular Surgery)

## 2017-09-10 ENCOUNTER — Inpatient Hospital Stay (HOSPITAL_COMMUNITY): Payer: Medicare Other

## 2017-09-10 DIAGNOSIS — J869 Pyothorax without fistula: Secondary | ICD-10-CM

## 2017-09-10 LAB — POCT I-STAT 3, ART BLOOD GAS (G3+)
Acid-base deficit: 1 mmol/L (ref 0.0–2.0)
Bicarbonate: 23.5 mmol/L (ref 20.0–28.0)
O2 Saturation: 97 %
Patient temperature: 36.9
TCO2: 25 mmol/L (ref 22–32)
pCO2 arterial: 36.4 mmHg (ref 32.0–48.0)
pH, Arterial: 7.418 (ref 7.350–7.450)
pO2, Arterial: 93 mmHg (ref 83.0–108.0)

## 2017-09-10 LAB — CBC
HCT: 26.4 % — ABNORMAL LOW (ref 39.0–52.0)
Hemoglobin: 8.5 g/dL — ABNORMAL LOW (ref 13.0–17.0)
MCH: 29.8 pg (ref 26.0–34.0)
MCHC: 32.2 g/dL (ref 30.0–36.0)
MCV: 92.6 fL (ref 78.0–100.0)
Platelets: 364 10*3/uL (ref 150–400)
RBC: 2.85 MIL/uL — ABNORMAL LOW (ref 4.22–5.81)
RDW: 14.8 % (ref 11.5–15.5)
WBC: 22.2 10*3/uL — ABNORMAL HIGH (ref 4.0–10.5)

## 2017-09-10 LAB — URINALYSIS, ROUTINE W REFLEX MICROSCOPIC
Bilirubin Urine: NEGATIVE
Glucose, UA: NEGATIVE mg/dL
Ketones, ur: NEGATIVE mg/dL
Leukocytes, UA: NEGATIVE
Nitrite: NEGATIVE
Protein, ur: NEGATIVE mg/dL
Specific Gravity, Urine: 1.005 (ref 1.005–1.030)
Squamous Epithelial / LPF: NONE SEEN
pH: 5 (ref 5.0–8.0)

## 2017-09-10 LAB — BASIC METABOLIC PANEL
Anion gap: 10 (ref 5–15)
BUN: 14 mg/dL (ref 6–20)
CO2: 24 mmol/L (ref 22–32)
Calcium: 8.1 mg/dL — ABNORMAL LOW (ref 8.9–10.3)
Chloride: 104 mmol/L (ref 101–111)
Creatinine, Ser: 1.22 mg/dL (ref 0.61–1.24)
GFR calc Af Amer: >60 mL/min (ref >60)
GFR calc non Af Amer: 53 mL/min — ABNORMAL LOW (ref >60)
Glucose, Bld: 186 mg/dL — ABNORMAL HIGH (ref 65–99)
Potassium: 4 mmol/L (ref 3.5–5.1)
Sodium: 138 mmol/L (ref 135–145)

## 2017-09-10 LAB — CULTURE, BLOOD (ROUTINE X 2)
Culture: NO GROWTH
Culture: NO GROWTH
Special Requests: ADEQUATE
Special Requests: ADEQUATE

## 2017-09-10 MED ORDER — SODIUM CHLORIDE 0.45 % IV SOLN
INTRAVENOUS | Status: DC
  Administered 2017-09-10 – 2017-09-13 (×2): via INTRAVENOUS

## 2017-09-10 MED ORDER — HYDROMORPHONE HCL 2 MG PO TABS
2.0000 mg | ORAL_TABLET | Freq: Four times a day (QID) | ORAL | Status: DC | PRN
  Administered 2017-09-11 – 2017-09-14 (×7): 2 mg via ORAL
  Filled 2017-09-10 (×8): qty 1

## 2017-09-10 MED ORDER — ENOXAPARIN SODIUM 40 MG/0.4ML ~~LOC~~ SOLN
40.0000 mg | SUBCUTANEOUS | Status: DC
  Administered 2017-09-10 – 2017-09-14 (×5): 40 mg via SUBCUTANEOUS
  Filled 2017-09-10 (×5): qty 0.4

## 2017-09-10 NOTE — Care Management Note (Signed)
Case Management Note Marvetta Gibbons RN, BSN Unit 4E-Case Manager-- [place] coverage [phone]  Patient Details  Name: Aaron Schmidt MRN: [id number] Date of Birth: 11/02/1932  Subjective/Objective:    Pt admitted with pl. Effusion, s/p VATS on 12/17                Action/Plan: PTA pt lived at home with spouse- PCP- Hulan Fess, CM to follow for transition of care needs-   Expected Discharge Date:                  Expected Discharge Plan:  [place]  In-House Referral:     Discharge planning Services  CM Consult  Post Acute Care Choice:    Choice offered to:     DME Arranged:    DME Agency:     HH Arranged:    [place] Agency:     Status of Service:  In process, will continue to follow  If discussed at Long Length of Stay Meetings, dates discussed:    Discharge Disposition:   Additional Comments:  Dawayne Patricia, RN 09/10/2017, 2:40 PM

## 2017-09-10 NOTE — Progress Notes (Signed)
1 Day Post-Op Procedure(s) (LRB): VIDEO ASSISTED THORACOSCOPY (VATS)/DECORTICATION (Left) DRAINAGE OF PLEURAL EFFUSION (Left) VIDEO BRONCHOSCOPY Subjective: C/o pain at surgical site  Objective: Vital signs in last 24 hours: Temp:  [97.2 F (36.2 C)-98.5 F (36.9 C)] 97.9 F (36.6 C) (12/18 0815) Pulse Rate:  [86-107] 86 (12/18 0700) Cardiac Rhythm: Normal sinus rhythm (12/18 0400) Resp:  [13-24] 13 (12/18 0700) BP: (103-150)/(55-77) 113/63 (12/18 0700) SpO2:  [94 %-100 %] 97 % (12/18 0746) Arterial Line BP: (128-191)/(46-60) 145/47 (12/18 0700) Weight:  [192 lb 0.3 oz (87.1 kg)] 192 lb 0.3 oz (87.1 kg) (12/17 2200)  Hemodynamic parameters for last 24 hours:    Intake/Output from previous day: 12/17 0701 - 12/18 0700 In: 3676.7 [I.V.:1926.7; IV Piggyback:1600] Out: 1200 [Urine:590; Blood:150; Chest Tube:460] Intake/Output this shift: No intake/output data recorded.  General appearance: alert, cooperative and mild distress Neurologic: intact Heart: regular rate and rhythm Lungs: diminished breath sounds left base Abdomen: normal findings: soft, non-tender no air leak, serosanguinous drainage from CT  Lab Results: Recent Labs    09/09/17 2130 09/10/17 0518  WBC 23.7* 22.2*  HGB 9.2* 8.5*  HCT 28.5* 26.4*  PLT 406* 364   BMET:  Recent Labs    09/09/17 2130 09/10/17 0518  NA 136 138  K 3.8 4.0  CL 104 104  CO2 21* 24  GLUCOSE 184* 186*  BUN 12 14  CREATININE 1.16 1.22  CALCIUM 8.1* 8.1*    PT/INR:  Recent Labs    09/09/17 0714  LABPROT 15.5*  INR 1.24   ABG    Component Value Date/Time   PHART 7.418 09/10/2017 0533   HCO3 23.5 09/10/2017 0533   TCO2 25 09/10/2017 0533   ACIDBASEDEF 1.0 09/10/2017 0533   O2SAT 97.0 09/10/2017 0533   CBG (last 3)  Recent Labs    09/08/17 0836  GLUCAP 100*    Assessment/Plan: S/P Procedure(s) (LRB): VIDEO ASSISTED THORACOSCOPY (VATS)/DECORTICATION (Left) DRAINAGE OF PLEURAL EFFUSION (Left) VIDEO  BRONCHOSCOPY -POD # 1 decortication CV- stable  RESP- CXR OK, some atelectasis on left - continue IS, add flutter  Empyema with frank pus- continue Vanco and Zosyn pending cultures  Keep all CT on suction today  RENAL- creatinine and lytes OK- follow  ENDO- CBG elevated over night, OK this AM, monitor, treat as needed  PAIN- pain control relatively good at present- continue PCA  Use PO dilaudid PRN - dc oxycodone, tramadol  SCD + enoxaparin for DVT prophylaxis  Mobilize   LOS: 6 days    Aaron Schmidt 09/10/2017

## 2017-09-10 NOTE — Anesthesia Postprocedure Evaluation (Signed)
Anesthesia Post Note  Patient: Aaron Schmidt  Procedure(s) Performed: VIDEO ASSISTED THORACOSCOPY (VATS)/DECORTICATION (Left Chest) DRAINAGE OF PLEURAL EFFUSION (Left Chest) VIDEO BRONCHOSCOPY     Patient location during evaluation: PACU Anesthesia Type: General Level of consciousness: awake and alert Pain management: pain level controlled Vital Signs Assessment: post-procedure vital signs reviewed and stable Respiratory status: spontaneous breathing, nonlabored ventilation, respiratory function stable and patient connected to nasal cannula oxygen Cardiovascular status: blood pressure returned to baseline and stable Postop Assessment: no apparent nausea or vomiting Anesthetic complications: no    Last Vitals:  Vitals:   09/10/17 0600 09/10/17 0700  BP:  113/63  Pulse: 96 86  Resp: 16 13  Temp:    SpO2: 100% 100%    Last Pain:  Vitals:   09/10/17 0421  TempSrc:   PainSc: Tyler Deis

## 2017-09-10 NOTE — Progress Notes (Signed)
  Speech Language Pathology Treatment: Dysphagia  Patient Details Name: Aaron Schmidt MRN: [id number] DOB: 01-22-1933 Today's Date: 09/10/2017 Time: 2979-8921 SLP Time Calculation (min) (ACUTE ONLY): 18 min  Assessment / Plan / Recommendation Clinical Impression  Pt participatory with mild encouragement for po consumption and potential texture advancement. Observed taking  pills (2 moderate size) with thin with; delayed throat clear and no s/s with single size cup sips. Placed on clear liquids presumably following vats yesterday (on regular prior to procedure). Educated various textures with pt and wife and decided upon Dys 3, continue thin (pt initially opted to remain on clear). If s/s aspiration with pills/water apparent would recommend whole in applesauce. Will continue to follow briefly.     HPI HPI: Aaron Schmidt has a PMH of HTN, HLD, Diastolic Dysfunction EF 19-41%, presents with generalized weakness, SOB, left pleural effusion, dehydration, AKI on CKD      SLP Plan  Continue with current plan of care       Recommendations  Diet recommendations: Dysphagia 3 (mechanical soft);Thin liquid Liquids provided via: Cup;Straw Medication Administration: Whole meds with liquid Supervision: Patient able to self feed;Full supervision/cueing for compensatory strategies Compensations: Slow rate;Small sips/bites Postural Changes and/or Swallow Maneuvers: Seated upright 90 degrees                Oral Care Recommendations: Oral care BID Follow up Recommendations: (TBD) SLP Visit Diagnosis: Dysphagia, unspecified (R13.10) Plan: Continue with current plan of care                       Aaron Schmidt 09/10/2017, 1:28 PM  Aaron Schmidt.Ed [employer] [phone]

## 2017-09-10 NOTE — Progress Notes (Signed)
CT surgery p.m. Rounds  Patient in bed waiting for help with dinner tray 3 chest tubes secure with minimal drainage no airleak Pulmonary status stable

## 2017-09-10 NOTE — Progress Notes (Signed)
PROGRESS NOTE    Aaron Schmidt  CVE:[id number] DOB: 29-Mar-1933 DOA: 09/04/2017 PCP: Hulan Fess, MD      Brief Narrative:  83 yo M with HTN, HFpEF who presented with loculated pleural effusion, went to VATS yesterday.   Assessment & Plan:  Principal Problem:   Pleural effusion, left Active Problems:   Essential hypertension   SIRS (systemic inflammatory response syndrome) (HCC)   Elevated troponin   Normochromic normocytic anemia   Hyponatremia   Acute kidney injury superimposed on chronic kidney disease (HCC)   Chronic pain   Empyema lung (HCC)   Left Pleural effusion, present on admission: VATS yesterday by Dr. Roxan Hockey. -Continue Vancomycin/Zosyn -Fentanyl PCA per CVTS -Chest tube care per CVTS   Chronic normocytic normochromic anemia Stable.   Hyponatremia, resolved  Chronic lower back pain, chronic opioid use -Continue Lyrica, tizanidine, nortriptyline  Essential hypertension HCTZ on hold. -Continue statin  Other medications -Continue finasteride     DVT prophylaxis: Lovenox Code Status: FULL Family Communication: Wife at bedside Disposition Plan: Per CVTS   Consultants:   CVTS  Procedures:   VATS 12/17  Antimicrobials:   Vanc  Zosyn    Subjective: Feeling well.  Pain controlled with PCA. Pain on left side.  No new dyspnea.  Oriented.  No new fever, sputum.  Objective: Vitals:   09/10/17 1425 09/10/17 1600 09/10/17 1622 09/10/17 1800  BP:    (!) 118/51  Pulse:    93  Resp:  16  20  Temp:   98.5 F (36.9 C)   TempSrc:   Axillary   SpO2: 100% 100%  100%  Weight:      Height:        Intake/Output Summary (Last 24 hours) at 09/10/2017 1925 Last data filed at 09/10/2017 1600 Gross per 24 hour  Intake 2820.84 ml  Output 930 ml  Net 1890.84 ml   Filed Weights   09/04/17 1907 09/05/17 0500 09/09/17 2200  Weight: 84.2 kg (185 lb 10 oz) 84.2 kg (185 lb 10 oz) 87.1 kg (192 lb 0.3 oz)    Examination: General  appearance: Elderly adult male, alert and in mild distress from pain.   HEENT: Anicteric, conjunctiva pink, lids and lashes normal. No nasal deformity, discharge, epistaxis.   Skin: Warm and dry.  Pale. No jaundice.  No suspicious rashes or lesions. Cardiac: RRR, nl S1-S2, no murmurs appreciated.  Capillary refill is brisk.  JVPnot visible.  No LE edema.  Radial pulses 2+ and symmetric. Respiratory: Normal respiratory rate and rhythm.  CTAB without rales or wheezes. Abdomen: Abdomen soft.  No TTP. No ascites, distension, hepatosplenomegaly.   MSK: No deformities or effusions. Neuro: Awake and alert.  EOMI, moves all extremities. Speech fluent.    Psych: Sensorium intact and responding to questions, attention normal. Affect blunted.  Judgment and insight appear normal.    Data Reviewed: I have personally reviewed following labs and imaging studies:  CBC: Recent Labs  Lab 09/04/17 1349  09/07/17 0449 09/09/17 0223 09/09/17 1000 09/09/17 2130 09/10/17 0518  WBC 18.8*   < > 14.6* 14.1* 13.6* 23.7* 22.2*  NEUTROABS 17.2*  --   --   --   --   --   --   HGB 10.6*   < > 9.5* 9.5* 9.2* 9.2* 8.5*  HCT 32.3*   < > 29.4* 29.9* 28.0* 28.5* 26.4*  MCV 93.6   < > 93.0 92.9 94.6 93.8 92.6  PLT 313   < > 358 383 356  406* 364   < > = values in this interval not displayed.   Basic Metabolic Panel: Recent Labs  Lab 09/07/17 0449 09/09/17 0223 09/09/17 0714 09/09/17 2130 09/10/17 0518  NA 137 136 138 136 138  K 3.4* 3.5 3.5 3.8 4.0  CL 104 103 104 104 104  CO2 26 25 25  21* 24  GLUCOSE 110* 109* 107* 184* 186*  BUN 12 9 8 12 14   CREATININE 1.03 0.92 0.91 1.16 1.22  CALCIUM 7.9* 8.1* 8.3* 8.1* 8.1*   GFR: Estimated Creatinine Clearance: 48.2 mL/min (by C-G formula based on SCr of 1.22 mg/dL). Liver Function Tests: Recent Labs  Lab 09/05/17 0227 09/09/17 0714  AST 89* 70*  ALT 76* 70*  ALKPHOS 100 121  BILITOT 0.6 0.7  PROT 5.8* 5.2*  ALBUMIN 2.0* 1.7*   No results for input(s):  LIPASE, AMYLASE in the last 168 hours. No results for input(s): AMMONIA in the last 168 hours. Coagulation Profile: Recent Labs  Lab 09/09/17 0714  INR 1.24   Cardiac Enzymes: Recent Labs  Lab 09/04/17 1349  TROPONINI 0.05*   BNP (last 3 results) No results for input(s): PROBNP in the last 8760 hours. HbA1C: No results for input(s): HGBA1C in the last 72 hours. CBG: Recent Labs  Lab 09/08/17 0836  GLUCAP 100*   Lipid Profile: No results for input(s): CHOL, HDL, LDLCALC, TRIG, CHOLHDL, LDLDIRECT in the last 72 hours. Thyroid Function Tests: No results for input(s): TSH, T4TOTAL, FREET4, T3FREE, THYROIDAB in the last 72 hours. Anemia Panel: No results for input(s): VITAMINB12, FOLATE, FERRITIN, TIBC, IRON, RETICCTPCT in the last 72 hours. Urine analysis:    Component Value Date/Time   COLORURINE YELLOW 09/04/2017 [place] 09/04/2017 1445   LABSPEC 1.015 09/04/2017 1445   PHURINE 6.0 09/04/2017 1445   GLUCOSEU NEGATIVE 09/04/2017 1445   HGBUR NEGATIVE 09/04/2017 1445   BILIRUBINUR NEGATIVE 09/04/2017 1445   KETONESUR NEGATIVE 09/04/2017 1445   PROTEINUR NEGATIVE 09/04/2017 1445   UROBILINOGEN 0.2 10/20/2010 1625   NITRITE NEGATIVE 09/04/2017 1445   LEUKOCYTESUR NEGATIVE 09/04/2017 1445   Sepsis Labs: @LABRCNTIP (procalcitonin:4,lacticidven:4)  ) Recent Results (from the past 240 hour(s))  Culture, blood (x 2)     Status: None   Collection Time: 09/04/17 10:30 PM  Result Value Ref Range Status   Specimen Description BLOOD LEFT ARM  Final   Special Requests   Final    BOTTLES DRAWN AEROBIC AND ANAEROBIC Blood Culture adequate volume   Culture NO GROWTH 5 DAYS  Final   Report Status 09/10/2017 FINAL  Final  Culture, blood (x 2)     Status: None   Collection Time: 09/04/17 10:38 PM  Result Value Ref Range Status   Specimen Description BLOOD RIGHT ARM  Final   Special Requests IN PEDIATRIC BOTTLE Blood Culture adequate volume  Final   Culture NO  GROWTH 5 DAYS  Final   Report Status 09/10/2017 FINAL  Final  MRSA PCR Screening     Status: None   Collection Time: 09/08/17 10:31 PM  Result Value Ref Range Status   MRSA by PCR NEGATIVE NEGATIVE Final    Comment:        The GeneXpert MRSA Assay (FDA approved for NASAL specimens only), is one component of a comprehensive MRSA colonization surveillance program. It is not intended to diagnose MRSA infection nor to guide or monitor treatment for MRSA infections.   Culture, respiratory (NON-Expectorated)     Status: None (Preliminary result)   Collection  Time: 09/09/17  2:56 PM  Result Value Ref Range Status   Specimen Description BRONCHIAL ALVEOLAR LAVAGE  Final   Special Requests NONE  Final   Gram Stain   Final    RARE WBC PRESENT, PREDOMINANTLY MONONUCLEAR NO ORGANISMS SEEN    Culture TOO YOUNG TO READ  Final   Report Status PENDING  Incomplete  Culture, fungus without smear     Status: None (Preliminary result)   Collection Time: 09/09/17  4:04 PM  Result Value Ref Range Status   Specimen Description BRONCHIAL WASHINGS  Final   Special Requests NONE  Final   Culture TOO YOUNG TO READ  Final   Report Status PENDING  Incomplete  Culture, respiratory (NON-Expectorated)     Status: None (Preliminary result)   Collection Time: 09/09/17  4:04 PM  Result Value Ref Range Status   Specimen Description BRONCHIAL WASHINGS  Final   Special Requests NONE  Final   Gram Stain   Final    MODERATE WBC PRESENT, PREDOMINANTLY PMN ABUNDANT GRAM POSITIVE COCCI IN PAIRS IN CHAINS FEW GRAM NEGATIVE COCCOBACILLI    Culture TOO YOUNG TO READ  Final   Report Status PENDING  Incomplete  Aerobic/Anaerobic Culture (surgical/deep wound)     Status: None (Preliminary result)   Collection Time: 09/09/17  4:26 PM  Result Value Ref Range Status   Specimen Description PLEURAL  Final   Special Requests PLEURAL EFFUSION EXDUTE PT ON ZINACEF VANC ZOCYIN  Final   Gram Stain   Final    FEW WBC  PRESENT, PREDOMINANTLY PMN FEW GRAM POSITIVE COCCI IN PAIRS IN CHAINS    Culture NO GROWTH < 24 HOURS  Final   Report Status PENDING  Incomplete         Radiology Studies: Dg Chest 2 View  Result Date: 09/09/2017 CLINICAL DATA:  83 year old male with increasing shortness of breath EXAM: CHEST  2 VIEW COMPARISON:  09/04/2017 and prior chest radiographs FINDINGS: Increasing left pleural effusion and left lung atelectasis/airspace disease noted. There is no evidence of pneumothorax. A small right pleural effusion is present. Minimal right basilar atelectasis identified. No acute bony abnormalities are identified. IMPRESSION: 1. Increasing left pleural effusion and left lung atelectasis/ airspace disease. 2. Small right pleural effusion and minimal right basilar atelectasis. Electronically Signed   By: Margarette Canada M.D.   On: 09/09/2017 09:32   Dg Chest Port 1 View  Result Date: 09/10/2017 CLINICAL DATA:  Chest tube.  Sore chest.  Shortness of breath . EXAM: PORTABLE CHEST 1 VIEW COMPARISON:  09/09/2017. FINDINGS: Left IJ line, left chest tubes in stable position. Heart size stable. Bilateral pulmonary infiltrates and left-sided pleural effusion. No pneumothorax. Cervical spine fusion. IMPRESSION: 1. Left IJ line and left chest tubes in stable position. No pneumothorax. 2.  Bibasilar pulmonary infiltrates and bilateral pleural effusions. 3. Stable cardiomegaly. Electronically Signed   By: Marcello Moores  Register   On: 09/10/2017 09:48   Dg Chest Port 1 View  Result Date: 09/09/2017 CLINICAL DATA:  Empyema of lung.  Chest tube, central line. EXAM: PORTABLE CHEST 1 VIEW COMPARISON:  Chest x-ray from earlier same day, chest x-ray dated 09/04/2017. FINDINGS: Interval placement of 2 left-sided chest tubes, 1 positioned at the left lung base and the other directed towards the left lung apex. Significantly decreased opacity within the left lung, presumably status post evacuation of the empyema. Small right  pleural effusion and/or atelectasis. Left IJ central line in place with tip at the level of the upper  SVC. Heart size and mediastinal contours are grossly stable. Atherosclerotic changes noted at the aortic arch. IMPRESSION: 1. Interval placement of 2 left-sided chest tubes. Significantly decreased opacity within the left lung, presumably related to evacuation of the left lung empyema. 2. Small right pleural effusion and/or atelectasis. 3. Left IJ central line placement with tip at the level of the upper SVC. Would consider advancing towards the lower SVC/cavoatrial junction for optimal radiographic positioning. 4. Aortic atherosclerosis. Electronically Signed   By: Franki Cabot M.D.   On: 09/09/2017 20:38        Scheduled Meds: . acetaminophen  1,000 mg Oral Q6H   Or  . acetaminophen (TYLENOL) oral liquid 160 mg/5 mL  1,000 mg Oral Q6H  . bisacodyl  10 mg Oral Daily  . enoxaparin (LOVENOX) injection  40 mg Subcutaneous Q24H  . feeding supplement  237 mL Oral TID WC  . feeding supplement (ENSURE ENLIVE)  237 mL Oral BID BM  . fentaNYL   Intravenous Q4H  . levalbuterol  0.63 mg Nebulization TID  . nortriptyline  25 mg Oral QHS  . pregabalin  150 mg Oral BID  . senna-docusate  1 tablet Oral QHS  . simvastatin  40 mg Oral QPM   Continuous Infusions: . sodium chloride 50 mL/hr at 09/10/17 1000  . piperacillin-tazobactam (ZOSYN)  IV Stopped (09/10/17 1811)  . potassium chloride    . vancomycin Stopped (09/10/17 1000)     LOS: 6 days    Time spent: 45 minutes    Edwin Dada, MD [facility] Pager [phone]  If 7PM-7AM, please contact night-coverage [email or web address] Password [email or web address] 09/10/2017, 7:25 PM

## 2017-09-10 NOTE — OR Nursing (Signed)
Late entry due to case time corrections.

## 2017-09-11 ENCOUNTER — Inpatient Hospital Stay (HOSPITAL_COMMUNITY): Payer: Medicare Other

## 2017-09-11 DIAGNOSIS — E43 Unspecified severe protein-calorie malnutrition: Secondary | ICD-10-CM

## 2017-09-11 HISTORY — DX: Unspecified severe protein-calorie malnutrition: E43

## 2017-09-11 LAB — COMPREHENSIVE METABOLIC PANEL
ALT: 53 U/L (ref 17–63)
AST: 38 U/L (ref 15–41)
Albumin: 1.7 g/dL — ABNORMAL LOW (ref 3.5–5.0)
Alkaline Phosphatase: 91 U/L (ref 38–126)
Anion gap: 7 (ref 5–15)
BUN: 15 mg/dL (ref 6–20)
CO2: 26 mmol/L (ref 22–32)
Calcium: 8.1 mg/dL — ABNORMAL LOW (ref 8.9–10.3)
Chloride: 107 mmol/L (ref 101–111)
Creatinine, Ser: 1.4 mg/dL — ABNORMAL HIGH (ref 0.61–1.24)
GFR calc Af Amer: 52 mL/min — ABNORMAL LOW (ref >60)
GFR calc non Af Amer: 45 mL/min — ABNORMAL LOW (ref >60)
Glucose, Bld: 99 mg/dL (ref 65–99)
Potassium: 3.8 mmol/L (ref 3.5–5.1)
Sodium: 140 mmol/L (ref 135–145)
Total Bilirubin: 0.4 mg/dL (ref 0.3–1.2)
Total Protein: 5.1 g/dL — ABNORMAL LOW (ref 6.5–8.1)

## 2017-09-11 LAB — CBC
HCT: 25 % — ABNORMAL LOW (ref 39.0–52.0)
Hemoglobin: 7.8 g/dL — ABNORMAL LOW (ref 13.0–17.0)
MCH: 29.3 pg (ref 26.0–34.0)
MCHC: 31.2 g/dL (ref 30.0–36.0)
MCV: 94 fL (ref 78.0–100.0)
Platelets: 344 10*3/uL (ref 150–400)
RBC: 2.66 MIL/uL — ABNORMAL LOW (ref 4.22–5.81)
RDW: 15.2 % (ref 11.5–15.5)
WBC: 13.8 10*3/uL — ABNORMAL HIGH (ref 4.0–10.5)

## 2017-09-11 LAB — ACID FAST SMEAR (AFB, MYCOBACTERIA): Acid Fast Smear: NEGATIVE

## 2017-09-11 MED ORDER — SODIUM CHLORIDE 0.9% FLUSH: 10.0000 mL | INTRAVENOUS | Status: DC | PRN

## 2017-09-11 MED ORDER — SODIUM CHLORIDE 0.9% FLUSH
10.0000 mL | Freq: Two times a day (BID) | INTRAVENOUS | Status: DC
  Administered 2017-09-11 – 2017-09-13 (×5): 10 mL

## 2017-09-11 MED ORDER — CHLORHEXIDINE GLUCONATE CLOTH 2 % EX PADS
6.0000 | MEDICATED_PAD | Freq: Every day | CUTANEOUS | Status: DC
  Administered 2017-09-11: 6 via TOPICAL

## 2017-09-11 MED ORDER — MINERAL OIL PO OIL
60.0000 mL | TOPICAL_OIL | Freq: Every day | ORAL | Status: DC
  Filled 2017-09-11 (×3): qty 60

## 2017-09-11 MED ORDER — ASPIRIN EC 81 MG PO TBEC
81.0000 mg | DELAYED_RELEASE_TABLET | Freq: Every day | ORAL | Status: DC
  Administered 2017-09-11 – 2017-09-14 (×4): 81 mg via ORAL
  Filled 2017-09-11 (×5): qty 1

## 2017-09-11 MED ORDER — SENNA 8.6 MG PO TABS: 1.0000 | ORAL_TABLET | Freq: Two times a day (BID) | ORAL | Status: DC | PRN

## 2017-09-11 NOTE — Progress Notes (Signed)
PROGRESS NOTE    Aaron Schmidt  ZDG:[id number] DOB: 1933/01/12 DOA: 09/04/2017 PCP: Hulan Fess, MD      Brief Narrative:  83 yo M with HTN, HFpEF who presented with loculated pleural effusion, went to VATS on 12/17 with Dr. Roxan Hockey.   Assessment & Plan:  Principal Problem:   Pleural effusion, left Active Problems:   Essential hypertension   SIRS (systemic inflammatory response syndrome) (HCC)   Elevated troponin   Normochromic normocytic anemia   Hyponatremia   Acute kidney injury superimposed on chronic kidney disease (HCC)   Chronic pain   Empyema lung (HCC)   Protein-calorie malnutrition, severe   Left Pleural effusion Empyema VATS on 12/17 by Dr. Roxan Hockey. -Continue Vancomycin/Zosyn -Follow cultures, likely low yield -Fentanyl PCA per CVTS -Chest tube care per CVTS   Renal function Worsening slightly (baseline 0.9-1.2) in setting of surgery, empyema, and vancomycin. -Daily BMP  Chronic normocytic normochromic anemia Stable.   Hyponatremia, resolved  Severe protein calorie malnutrition -Consult to nutrition  Chronic lower back pain, chronic opioid use -Continue Lyrica, tizanidine, nortriptyline  Essential hypertension HCTZ on hold. -Continue statin  Other medications -Continue finasteride     DVT prophylaxis: Lovenox Code Status: FULL Family Communication: None present Disposition Plan: Per CVTS   Consultants:   CVTS  Procedures:   VATS 12/17  Antimicrobials:   Vanc 12/12 >>  Zosyn 12/12 >>   Subjective: Feeling well.  No new fever, cough, sputum, confusion, weakness.  Objective: Vitals:   09/11/17 1000 09/11/17 1150 09/11/17 1200 09/11/17 1357  BP: (!) 99/57  105/66   Pulse: 93  88   Resp: 13  16   Temp:  97.9 F (36.6 C)    TempSrc:  Oral    SpO2: 96%  98% 97%  Weight:      Height:        Intake/Output Summary (Last 24 hours) at 09/11/2017 1432 Last data filed at 09/11/2017 1222 Gross per 24  hour  Intake 1780 ml  Output 893 ml  Net 887 ml   Filed Weights   09/05/17 0500 09/09/17 2200 09/11/17 0630  Weight: 84.2 kg (185 lb 10 oz) 87.1 kg (192 lb 0.3 oz) 86.7 kg (191 lb 2.2 oz)    Examination: General appearance: Elderly adult male, alert ambulates slowly to bathroom with assistance.   Cardiac: RRR, nl S1-S2, no murmurs appreciated.  Respiratory: Normal respiratory rate and rhythm.  Diminsiehd left.  CTAB without rales or wheezes. Neuro: Awake and alert.  EOMI, moves all extremities. Speech fluent.       Data Reviewed: I have personally reviewed following labs and imaging studies:  CBC: Recent Labs  Lab 09/09/17 0223 09/09/17 1000 09/09/17 2130 09/10/17 0518 09/11/17 0427  WBC 14.1* 13.6* 23.7* 22.2* 13.8*  HGB 9.5* 9.2* 9.2* 8.5* 7.8*  HCT 29.9* 28.0* 28.5* 26.4* 25.0*  MCV 92.9 94.6 93.8 92.6 94.0  PLT 383 356 406* 364 951   Basic Metabolic Panel: Recent Labs  Lab 09/09/17 0223 09/09/17 0714 09/09/17 2130 09/10/17 0518 09/11/17 0427  NA 136 138 136 138 140  K 3.5 3.5 3.8 4.0 3.8  CL 103 104 104 104 107  CO2 25 25 21* 24 26  GLUCOSE 109* 107* 184* 186* 99  BUN 9 8 12 14 15   CREATININE 0.92 0.91 1.16 1.22 1.40*  CALCIUM 8.1* 8.3* 8.1* 8.1* 8.1*   GFR: Estimated Creatinine Clearance: 42 mL/min (A) (by C-G formula based on SCr of 1.4 mg/dL (H)). Liver Function Tests:  Recent Labs  Lab 09/05/17 0227 09/09/17 0714 09/11/17 0427  AST 89* 70* 38  ALT 76* 70* 53  ALKPHOS 100 121 91  BILITOT 0.6 0.7 0.4  PROT 5.8* 5.2* 5.1*  ALBUMIN 2.0* 1.7* 1.7*   No results for input(s): LIPASE, AMYLASE in the last 168 hours. No results for input(s): AMMONIA in the last 168 hours. Coagulation Profile: Recent Labs  Lab 09/09/17 0714  INR 1.24   Cardiac Enzymes: No results for input(s): CKTOTAL, CKMB, CKMBINDEX, TROPONINI in the last 168 hours. BNP (last 3 results) No results for input(s): PROBNP in the last 8760 hours. HbA1C: No results for input(s):  HGBA1C in the last 72 hours. CBG: Recent Labs  Lab 09/08/17 0836  GLUCAP 100*   Lipid Profile: No results for input(s): CHOL, HDL, LDLCALC, TRIG, CHOLHDL, LDLDIRECT in the last 72 hours. Thyroid Function Tests: No results for input(s): TSH, T4TOTAL, FREET4, T3FREE, THYROIDAB in the last 72 hours. Anemia Panel: No results for input(s): VITAMINB12, FOLATE, FERRITIN, TIBC, IRON, RETICCTPCT in the last 72 hours. Urine analysis:    Component Value Date/Time   COLORURINE STRAW (A) 09/10/2017 2122   APPEARANCEUR CLEAR 09/10/2017 2122   LABSPEC 1.005 09/10/2017 2122   PHURINE 5.0 09/10/2017 2122   GLUCOSEU NEGATIVE 09/10/2017 2122   HGBUR SMALL (A) 09/10/2017 2122   BILIRUBINUR NEGATIVE 09/10/2017 2122   KETONESUR NEGATIVE 09/10/2017 2122   PROTEINUR NEGATIVE 09/10/2017 2122   UROBILINOGEN 0.2 10/20/2010 1625   NITRITE NEGATIVE 09/10/2017 2122   LEUKOCYTESUR NEGATIVE 09/10/2017 2122   Sepsis Labs: @LABRCNTIP (procalcitonin:4,lacticidven:4)  ) Recent Results (from the past 240 hour(s))  Culture, blood (x 2)     Status: None   Collection Time: 09/04/17 10:30 PM  Result Value Ref Range Status   Specimen Description BLOOD LEFT ARM  Final   Special Requests   Final    BOTTLES DRAWN AEROBIC AND ANAEROBIC Blood Culture adequate volume   Culture NO GROWTH 5 DAYS  Final   Report Status 09/10/2017 FINAL  Final  Culture, blood (x 2)     Status: None   Collection Time: 09/04/17 10:38 PM  Result Value Ref Range Status   Specimen Description BLOOD RIGHT ARM  Final   Special Requests IN PEDIATRIC BOTTLE Blood Culture adequate volume  Final   Culture NO GROWTH 5 DAYS  Final   Report Status 09/10/2017 FINAL  Final  MRSA PCR Screening     Status: None   Collection Time: 09/08/17 10:31 PM  Result Value Ref Range Status   MRSA by PCR NEGATIVE NEGATIVE Final    Comment:        The GeneXpert MRSA Assay (FDA approved for NASAL specimens only), is one component of a comprehensive MRSA  colonization surveillance program. It is not intended to diagnose MRSA infection nor to guide or monitor treatment for MRSA infections.   Culture, respiratory (NON-Expectorated)     Status: None (Preliminary result)   Collection Time: 09/09/17  2:56 PM  Result Value Ref Range Status   Specimen Description BRONCHIAL ALVEOLAR LAVAGE  Final   Special Requests NONE  Final   Gram Stain   Final    RARE WBC PRESENT, PREDOMINANTLY MONONUCLEAR NO ORGANISMS SEEN    Culture NO GROWTH 2 DAYS  Final   Report Status PENDING  Incomplete  Culture, fungus without smear     Status: None (Preliminary result)   Collection Time: 09/09/17  4:04 PM  Result Value Ref Range Status   Specimen Description BRONCHIAL WASHINGS  Final   Special Requests NONE  Final   Culture NO FUNGUS ISOLATED AFTER 2 DAYS  Final   Report Status PENDING  Incomplete  Anaerobic culture     Status: None (Preliminary result)   Collection Time: 09/09/17  4:04 PM  Result Value Ref Range Status   Specimen Description BRONCHIAL WASHINGS  Final   Special Requests NONE  Final   Culture NO GROWTH 2 DAYS  Final   Report Status PENDING  Incomplete  Culture, respiratory (NON-Expectorated)     Status: None (Preliminary result)   Collection Time: 09/09/17  4:04 PM  Result Value Ref Range Status   Specimen Description BRONCHIAL WASHINGS  Final   Special Requests NONE  Final   Gram Stain   Final    MODERATE WBC PRESENT, PREDOMINANTLY PMN ABUNDANT GRAM POSITIVE COCCI IN PAIRS IN CHAINS FEW GRAM NEGATIVE COCCOBACILLI    Culture CULTURE REINCUBATED FOR BETTER GROWTH  Final   Report Status PENDING  Incomplete  Aerobic/Anaerobic Culture (surgical/deep wound)     Status: None (Preliminary result)   Collection Time: 09/09/17  4:26 PM  Result Value Ref Range Status   Specimen Description PLEURAL  Final   Special Requests PLEURAL EFFUSION EXDUTE PT ON ZINACEF VANC ZOCYIN  Final   Gram Stain   Final    FEW WBC PRESENT, PREDOMINANTLY PMN FEW  GRAM POSITIVE COCCI IN PAIRS IN CHAINS    Culture NO GROWTH < 24 HOURS  Final   Report Status PENDING  Incomplete         Radiology Studies: Dg Chest Port 1 View  Result Date: 09/11/2017 CLINICAL DATA:  Followup chest tube.  Empyema. EXAM: PORTABLE CHEST 1 VIEW COMPARISON:  09/10/2017 FINDINGS: Multiple left chest tubes remain in place. Left internal jugular central line remains with tip at the azygos level. Right chest remains clear. Persistent pleural and parenchymal density on the left, with slightly less pleural density. Possible bulb bowl of pleural air at the left apex. No significant pneumothorax. IMPRESSION: Less pleural density on the left. Question bubble of air at the left pleural apex. No significant pneumothorax. Electronically Signed   By: Nelson Chimes M.D.   On: 09/11/2017 08:44   Dg Chest Port 1 View  Result Date: 09/10/2017 CLINICAL DATA:  Chest tube.  Sore chest.  Shortness of breath . EXAM: PORTABLE CHEST 1 VIEW COMPARISON:  09/09/2017. FINDINGS: Left IJ line, left chest tubes in stable position. Heart size stable. Bilateral pulmonary infiltrates and left-sided pleural effusion. No pneumothorax. Cervical spine fusion. IMPRESSION: 1. Left IJ line and left chest tubes in stable position. No pneumothorax. 2.  Bibasilar pulmonary infiltrates and bilateral pleural effusions. 3. Stable cardiomegaly. Electronically Signed   By: Marcello Moores  Register   On: 09/10/2017 09:48   Dg Chest Port 1 View  Result Date: 09/09/2017 CLINICAL DATA:  Empyema of lung.  Chest tube, central line. EXAM: PORTABLE CHEST 1 VIEW COMPARISON:  Chest x-ray from earlier same day, chest x-ray dated 09/04/2017. FINDINGS: Interval placement of 2 left-sided chest tubes, 1 positioned at the left lung base and the other directed towards the left lung apex. Significantly decreased opacity within the left lung, presumably status post evacuation of the empyema. Small right pleural effusion and/or atelectasis. Left IJ  central line in place with tip at the level of the upper SVC. Heart size and mediastinal contours are grossly stable. Atherosclerotic changes noted at the aortic arch. IMPRESSION: 1. Interval placement of 2 left-sided chest tubes. Significantly decreased opacity within the  left lung, presumably related to evacuation of the left lung empyema. 2. Small right pleural effusion and/or atelectasis. 3. Left IJ central line placement with tip at the level of the upper SVC. Would consider advancing towards the lower SVC/cavoatrial junction for optimal radiographic positioning. 4. Aortic atherosclerosis. Electronically Signed   By: Franki Cabot M.D.   On: 09/09/2017 20:38        Scheduled Meds: . acetaminophen  1,000 mg Oral Q6H   Or  . acetaminophen (TYLENOL) oral liquid 160 mg/5 mL  1,000 mg Oral Q6H  . bisacodyl  10 mg Oral Daily  . Chlorhexidine Gluconate Cloth  6 each Topical Daily  . enoxaparin (LOVENOX) injection  40 mg Subcutaneous Q24H  . feeding supplement  237 mL Oral TID WC  . feeding supplement (ENSURE ENLIVE)  237 mL Oral BID BM  . fentaNYL   Intravenous Q4H  . levalbuterol  0.63 mg Nebulization TID  . nortriptyline  25 mg Oral QHS  . pregabalin  150 mg Oral BID  . senna-docusate  1 tablet Oral QHS  . simvastatin  40 mg Oral QPM  . sodium chloride flush  10-40 mL Intracatheter Q12H   Continuous Infusions: . sodium chloride 10 mL/hr at 09/11/17 1200  . piperacillin-tazobactam (ZOSYN)  IV 3.375 g (09/11/17 1347)  . potassium chloride    . vancomycin Stopped (09/11/17 1113)     LOS: 7 days    Time spent: 10 minutes    Edwin Dada, MD [facility] Pager [phone]  If 7PM-7AM, please contact night-coverage [email or web address] Password [email or web address] 09/11/2017, 2:32 PM

## 2017-09-11 NOTE — Progress Notes (Signed)
2 Days Post-Op Procedure(s) (LRB): VIDEO ASSISTED THORACOSCOPY (VATS)/DECORTICATION (Left) DRAINAGE OF PLEURAL EFFUSION (Left) VIDEO BRONCHOSCOPY Subjective: Feels better today Still has incisional pain  Objective: Vital signs in last 24 hours: Temp:  [97.4 F (36.3 C)-98.5 F (36.9 C)] 98.2 F (36.8 C) (12/19 0750) Pulse Rate:  [88-106] 97 (12/19 0800) Cardiac Rhythm: Normal sinus rhythm (12/19 0809) Resp:  [10-21] 15 (12/19 0800) BP: (104-142)/(51-85) 107/61 (12/19 0800) SpO2:  [91 %-100 %] 98 % (12/19 0809) Arterial Line BP: (163)/(54) 163/54 (12/18 0900) Weight:  [191 lb 2.2 oz (86.7 kg)] 191 lb 2.2 oz (86.7 kg) (12/19 0630)  Hemodynamic parameters for last 24 hours:    Intake/Output from previous day: 12/18 0701 - 12/19 0700 In: 594.2 [I.V.:144.2; IV Piggyback:450] Out: 1040 [Urine:900; Chest Tube:140] Intake/Output this shift: Total I/O In: 1100 [I.V.:1100] Out: 0   General appearance: alert, cooperative and no distress Neurologic: intact Heart: regular rate and rhythm Lungs: diminished breath sounds left base no air leak  Lab Results: Recent Labs    09/10/17 0518 09/11/17 0427  WBC 22.2* 13.8*  HGB 8.5* 7.8*  HCT 26.4* 25.0*  PLT 364 344   BMET:  Recent Labs    09/10/17 0518 09/11/17 0427  NA 138 140  K 4.0 3.8  CL 104 107  CO2 24 26  GLUCOSE 186* 99  BUN 14 15  CREATININE 1.22 1.40*  CALCIUM 8.1* 8.1*    PT/INR:  Recent Labs    09/09/17 0714  LABPROT 15.5*  INR 1.24   ABG    Component Value Date/Time   PHART 7.418 09/10/2017 0533   HCO3 23.5 09/10/2017 0533   TCO2 25 09/10/2017 0533   ACIDBASEDEF 1.0 09/10/2017 0533   O2SAT 97.0 09/10/2017 0533   CBG (last 3)  No results for input(s): GLUCAP in the last 72 hours.  Assessment/Plan: S/P Procedure(s) (LRB): VIDEO ASSISTED THORACOSCOPY (VATS)/DECORTICATION (Left) DRAINAGE OF PLEURAL EFFUSION (Left) VIDEO BRONCHOSCOPY Plan for transfer to step-down: see transfer orders  POD #  2- looks much better this morning  CV- stable  RESP- s/p decortication for empyema  Gram stain - GPC in pairs- continue Vanco and Zosyn pending cultures  IS, nebs  No air leak- dc anterior CT   RENAL_ creatinine up slightly to 1.40- follow  Dc Foley  SCD + enoxaparin for DVT prophylaxis  Continue ambulation   LOS: 7 days    Aaron Schmidt 09/11/2017

## 2017-09-11 NOTE — Progress Notes (Signed)
Pharmacy Antibiotic Note  Aaron Schmidt is a 83 y.o. male admitted on 09/04/2017 with sepsis and pleural effusion vs HCAP.  Pharmacy has been consulted for vancomycin and zosyn dosing - day 7 of broad-spectrum antibiotics.  WBC has jumped up on 12/17 (secondary to surgery and dexamethasone dose); however, has decreased to 13.8 today. Patient remains afebrile. Previously, vancomycin trough was within goal range at 17 with Scr ~1. Today Scr up slightly to 1.4 - will monitor closely, if continues to trend up might need trough. Cultures so far from procedure showing no growth to date- will continue to monitor.   Plan: Continue Zosyn 3.375 gram IV q 8 hrs (4-hr infusion) Continue vancomycin 750 mg IV q12 h. Goal trough 15-20 Monitor renal function closely for future dose adjustments - vanc trough as indicated Follow culture results, clinical status, LOT/de-escalation plans >> will defer to team on duration of broad-sepctrum antibiotics based on culture results  Height: _0  (177.8 cm) Weight: 191 lb 2.2 oz (86.7 kg) IBW/kg (Calculated) : 73  Temp (24hrs), Avg:97.8 F (36.6 C), Min:97.4 F (36.3 C), Max:98.5 F (36.9 C)  Recent Labs  Lab 09/04/17 2216 09/05/17 0227  09/08/17 0916 09/09/17 0223 09/09/17 0714 09/09/17 1000 09/09/17 2130 09/10/17 0518 09/11/17 0427  WBC  --  15.2*   < >  --  14.1*  --  13.6* 23.7* 22.2* 13.8*  CREATININE  --  1.37*   < >  --  0.92 0.91  --  1.16 1.22 1.40*  LATICACIDVEN 0.8 0.7  --   --   --   --   --   --   --   --   VANCOTROUGH  --   --   --  17  --   --   --   --   --   --    < > = values in this interval not displayed.    Estimated Creatinine Clearance: 42 mL/min (A) (by C-G formula based on SCr of 1.4 mg/dL (H)).    Allergies  Allergen Reactions  . Diazepam Other (See Comments)    "makes goofy"    Antimicrobials this admission: Vancomycin 12/13>> Zosyn 12/13>>  Dose adjustments this admission: 12/14: Vancomycin adjusted from 1250 mg  IV q 24 to 750 mg IV q 12 in light of improving renal function  Microbiology results: 12/17 Bronch washing: few gram negative coccobacilli 12/17 BAL: no orgs seen 12/17 L pleural effusion exdate: NGTD 12/12 blood x 2 >>NGTD 11/27 pleural fluid >> no growth Final  Thank you for allowing pharmacy to be a part of this patient's care.  Doylene Canard, PharmD Clinical Pharmacist  Pager: [phone] Clinical Phone for 09/11/2017 until 3:30pm: [phone] If after 3:30pm, please call main pharmacy at [phone] 09/11/2017 10:55 AM

## 2017-09-11 NOTE — Progress Notes (Signed)
  Speech Language Pathology Treatment:    Patient Details Name: Aaron Schmidt MRN: [id number] DOB: Dec 30, 1932 Today's Date: 09/11/2017 Time: 5038-8828 SLP Time Calculation (min) (ACUTE ONLY): 14 min  Assessment / Plan / Recommendation Clinical Impression  Observed with upgraded texture of regular with timely mastication and manipulation, no indications of aspiration with solid or thin. No direct cueing needed just reminders to sit upright during meals. Pt and wife in agreement with recommendation for upright positioning with meals. Discharge ST.   HPI HPI: Aaron Schmidt has a PMH of HTN, HLD, Diastolic Dysfunction EF 00-34%, presents with generalized weakness, SOB, left pleural effusion, dehydration, AKI on CKD      SLP Plan  All goals met;Discharge SLP treatment due to (comment)       Recommendations  Liquids provided via: Cup;Straw Medication Administration: Whole meds with liquid Supervision: Patient able to self feed Compensations: Slow rate;Small sips/bites Postural Changes and/or Swallow Maneuvers: Seated upright 90 degrees                Oral Care Recommendations: Oral care BID Follow up Recommendations: None SLP Visit Diagnosis: Dysphagia, unspecified (R13.10) Plan: All goals met;Discharge SLP treatment due to (comment)                       Houston Siren 09/11/2017, 3:44 PM   Orbie Pyo Colvin Caroli.Ed [employer] [phone]

## 2017-09-12 ENCOUNTER — Inpatient Hospital Stay (HOSPITAL_COMMUNITY): Payer: Medicare Other

## 2017-09-12 LAB — CBC
HCT: 23.9 % — ABNORMAL LOW (ref 39.0–52.0)
Hemoglobin: 7.5 g/dL — ABNORMAL LOW (ref 13.0–17.0)
MCH: 29.3 pg (ref 26.0–34.0)
MCHC: 31.4 g/dL (ref 30.0–36.0)
MCV: 93.4 fL (ref 78.0–100.0)
Platelets: 346 10*3/uL (ref 150–400)
RBC: 2.56 MIL/uL — ABNORMAL LOW (ref 4.22–5.81)
RDW: 15.3 % (ref 11.5–15.5)
WBC: 11.2 10*3/uL — ABNORMAL HIGH (ref 4.0–10.5)

## 2017-09-12 LAB — BASIC METABOLIC PANEL
Anion gap: 8 (ref 5–15)
BUN: 16 mg/dL (ref 6–20)
CO2: 25 mmol/L (ref 22–32)
Calcium: 8 mg/dL — ABNORMAL LOW (ref 8.9–10.3)
Chloride: 105 mmol/L (ref 101–111)
Creatinine, Ser: 1.36 mg/dL — ABNORMAL HIGH (ref 0.61–1.24)
GFR calc Af Amer: 54 mL/min — ABNORMAL LOW (ref >60)
GFR calc non Af Amer: 47 mL/min — ABNORMAL LOW (ref >60)
Glucose, Bld: 99 mg/dL (ref 65–99)
Potassium: 2.9 mmol/L — ABNORMAL LOW (ref 3.5–5.1)
Sodium: 138 mmol/L (ref 135–145)

## 2017-09-12 LAB — CULTURE, RESPIRATORY W GRAM STAIN
Culture: NO GROWTH
Culture: NO GROWTH

## 2017-09-12 LAB — VANCOMYCIN, TROUGH: Vancomycin Tr: 31 ug/mL (ref 15–20)

## 2017-09-12 LAB — MAGNESIUM: Magnesium: 1.7 mg/dL (ref 1.7–2.4)

## 2017-09-12 MED ORDER — PRO-STAT SUGAR FREE PO LIQD: 30.0000 mL | Freq: Three times a day (TID) | ORAL | Status: DC

## 2017-09-12 MED ORDER — POTASSIUM CHLORIDE 10 MEQ/50ML IV SOLN
10.0000 meq | INTRAVENOUS | Status: AC
  Administered 2017-09-12 (×4): 10 meq via INTRAVENOUS
  Filled 2017-09-12 (×4): qty 50

## 2017-09-12 MED ORDER — VANCOMYCIN HCL IN DEXTROSE 750-5 MG/150ML-% IV SOLN
750.0000 mg | INTRAVENOUS | Status: DC
Start: 2017-09-13 — End: 2017-09-13
  Filled 2017-09-12: qty 150

## 2017-09-12 MED ORDER — ADULT MULTIVITAMIN W/MINERALS CH
1.0000 | ORAL_TABLET | Freq: Every day | ORAL | Status: DC
  Administered 2017-09-12 – 2017-09-14 (×3): 1 via ORAL
  Filled 2017-09-12 (×3): qty 1

## 2017-09-12 NOTE — Progress Notes (Signed)
Nutrition Follow-up  DOCUMENTATION CODES:   Severe malnutrition in context of chronic illness  INTERVENTION:   -D/c Ensure Enlive po BID, each supplement provides 350 kcal and 20 grams of protein -D/c Boost Breeze po TID, each supplement provides 250 kcal and 9 grams of protein -MVI daily -30 ml Prostat TID, each supplement provides 100 kcals and 15 grams protein -Hormel Shake TID with meals  NUTRITION DIAGNOSIS:   Severe Malnutrition related to chronic illness as evidenced by severe fat depletion, moderate fat depletion, moderate muscle depletion, percent weight loss.  Ongoing  GOAL:   Patient will meet greater than or equal to 90% of their needs  Un,et  MONITOR:   PO intake, Supplement acceptance, I & O's, Labs, Weight trends  REASON FOR ASSESSMENT:   Malnutrition Screening Tool    ASSESSMENT:   Aaron Schmidt has a PMH of HTN, HLD, Diastolic Dysfunction EF 16-10%, presents with generalized weakness, SOB, left pleural effusion, dehydration, AKI on CKD  12/14- s/p SLP, advanced to regular diet with thin liquids  12/17- s/p Procedure(s): VIDEO BRONCHOSCOPY,  LEFT VIDEO ASSISTED THORACOSCOPY (VATS), DRAINAGE OF PLEURAL EFFUSION, DECORTICATION 12/18- s/p SLP eval, recommended dysphagia 3 diet with thin liquids  Pt receiving nursing care at time of visit.   Pt with very poor appetite. Noted some refusals of meals over the past 2-3 days. Noted 0-25% meal completion. Pt is also refusing both Boost Breeze and Ensure supplements. Poor oral intake along with refusal of supplements and severe malnutrition is concerning; pt may benefit from short-term enteral nutrition support if poor oral intake persists.   Labs reviewed: K: 2.9  Diet Order:  Diet regular Room service appropriate? Yes; Fluid consistency: Thin  EDUCATION NEEDS:   Education needs have been addressed  Skin:  Skin Assessment: Skin Integrity Issues: Skin Integrity Issues:: Incisions, Other (Comment) Incisions:  closed lt chest  Other: chest tubes x 3  Last BM:  09/11/17  Height:   Ht Readings from Last 1 Encounters:  09/09/17 5\' 10"  (1.778 m)    Weight:   Wt Readings from Last 1 Encounters:  09/11/17 191 lb 2.2 oz (86.7 kg)    Ideal Body Weight:  75.45 kg  BMI:  Body mass index is 27.43 kg/m.  Estimated Nutritional Needs:   Kcal:  2050-2250  Protein:  105-120 grams  Fluid:  > 2 L    Aaron Schmidt, RD, LDN, CDE Pager: [phone] After hours Pager: [phone]

## 2017-09-12 NOTE — Progress Notes (Signed)
PROGRESS NOTE    Aaron Schmidt  SEG:[id number] DOB: [birth year] DOA: 09/04/2017 PCP: Hulan Fess, MD      Brief Narrative:  83 yo M with HTN, HFpEF who presented with loculated pleural effusion, went to VATS on 12/17 with Dr. Roxan Hockey.   Assessment & Plan:  Principal Problem:   Pleural effusion, left Active Problems:   Essential hypertension   SIRS (systemic inflammatory response syndrome) (HCC)   Elevated troponin   Normochromic normocytic anemia   Hyponatremia   Acute kidney injury superimposed on chronic kidney disease (HCC)   Chronic pain   Empyema lung (HCC)   Protein-calorie malnutrition, severe   Left Pleural effusion Empyema VATS on 12/17 by Dr. Roxan Hockey. -Continue Vancomycin/Zosyn -Follow cultures, likely low yield given 5d abx before -Fentanyl PCA per CVTS -Chest tube care per CVTS   Renal function Stable from yesterday. -Daily BMP  Chronic normocytic normochromic anemia Trending down. -Transfusion threshold 7 g/dL  Hyponatremia, resolved  Hypokalemia NSVT -CVTS ordered K -Repeat BMP tomorrow -Check mag  Severe protein calorie malnutrition -Consult to nutrition  Chronic lower back pain, chronic opioid use -Continue Lyrica, tizanidine, nortriptyline  Essential hypertension HCTZ on hold. -Continue statin  Other medications -Continue finasteride     DVT prophylaxis: Lovenox Code Status: FULL Family Communication: None present Disposition Plan: Per CVTS   Consultants:   CVTS  Procedures:   VATS 12/17  Antimicrobials:   Vanc 12/12 >>  Zosyn 12/12 >>   Subjective: Ate an egg salad sandwich and now with some nausea, bloating, upset stomach.  Pain stable. No new fever, cough, sputum, confusion, weakness.  Objective: Vitals:   09/12/17 0510 09/12/17 0818 09/12/17 0830 09/12/17 0942  BP:   134/64   Pulse:      Resp: 15   14  Temp:   97.8 F (36.6 C)   TempSrc:   Oral   SpO2: 100% 96%  94%  Weight:        Height:        Intake/Output Summary (Last 24 hours) at 09/12/2017 1255 Last data filed at 09/12/2017 0900 Gross per 24 hour  Intake 540 ml  Output 565 ml  Net -25 ml   Filed Weights   09/05/17 0500 09/09/17 2200 09/11/17 0630  Weight: 84.2 kg (185 lb 10 oz) 87.1 kg (192 lb 0.3 oz) 86.7 kg (191 lb 2.2 oz)    Examination: General appearance: Elderly adult male, alert ambulates slowly to bathroom with assistance.   Cardiac: RRR, nl S1-S2, no murmurs appreciated.  Respiratory: Normal respiratory rate and rhythm.  Diminsiehd left.  Right side good air movement, no rales, wheezes. Abdomen: No focal tenderness, slightly tender thorughout, no guarding or rebound.  Bowel sounds hyperactive. Neuro: Awake and alert.  EOMI, moves all extremities. Speech fluent.       Data Reviewed: I have personally reviewed following labs and imaging studies:  CBC: Recent Labs  Lab 09/09/17 1000 09/09/17 2130 09/10/17 0518 09/11/17 0427 09/12/17 0507  WBC 13.6* 23.7* 22.2* 13.8* 11.2*  HGB 9.2* 9.2* 8.5* 7.8* 7.5*  HCT 28.0* 28.5* 26.4* 25.0* 23.9*  MCV 94.6 93.8 92.6 94.0 93.4  PLT 356 406* 364 344 737   Basic Metabolic Panel: Recent Labs  Lab 09/09/17 0714 09/09/17 2130 09/10/17 0518 09/11/17 0427 09/12/17 0507  NA 138 136 138 140 138  K 3.5 3.8 4.0 3.8 2.9*  CL 104 104 104 107 105  CO2 25 21* 24 26 25   GLUCOSE 107* 184* 186* 99 99  BUN 8 12 14 15 16   CREATININE 0.91 1.16 1.22 1.40* 1.36*  CALCIUM 8.3* 8.1* 8.1* 8.1* 8.0*   GFR: Estimated Creatinine Clearance: 43.2 mL/min (A) (by C-G formula based on SCr of 1.36 mg/dL (H)). Liver Function Tests: Recent Labs  Lab 09/09/17 0714 09/11/17 0427  AST 70* 38  ALT 70* 53  ALKPHOS 121 91  BILITOT 0.7 0.4  PROT 5.2* 5.1*  ALBUMIN 1.7* 1.7*   No results for input(s): LIPASE, AMYLASE in the last 168 hours. No results for input(s): AMMONIA in the last 168 hours. Coagulation Profile: Recent Labs  Lab 09/09/17 0714  INR 1.24    Cardiac Enzymes: No results for input(s): CKTOTAL, CKMB, CKMBINDEX, TROPONINI in the last 168 hours. BNP (last 3 results) No results for input(s): PROBNP in the last 8760 hours. HbA1C: No results for input(s): HGBA1C in the last 72 hours. CBG: Recent Labs  Lab 09/08/17 0836  GLUCAP 100*   Lipid Profile: No results for input(s): CHOL, HDL, LDLCALC, TRIG, CHOLHDL, LDLDIRECT in the last 72 hours. Thyroid Function Tests: No results for input(s): TSH, T4TOTAL, FREET4, T3FREE, THYROIDAB in the last 72 hours. Anemia Panel: No results for input(s): VITAMINB12, FOLATE, FERRITIN, TIBC, IRON, RETICCTPCT in the last 72 hours. Urine analysis:    Component Value Date/Time   COLORURINE STRAW (A) 09/10/2017 2122   APPEARANCEUR CLEAR 09/10/2017 2122   LABSPEC 1.005 09/10/2017 2122   PHURINE 5.0 09/10/2017 2122   GLUCOSEU NEGATIVE 09/10/2017 2122   HGBUR SMALL (A) 09/10/2017 2122   BILIRUBINUR NEGATIVE 09/10/2017 2122   KETONESUR NEGATIVE 09/10/2017 2122   PROTEINUR NEGATIVE 09/10/2017 2122   UROBILINOGEN 0.2 10/20/2010 1625   NITRITE NEGATIVE 09/10/2017 2122   LEUKOCYTESUR NEGATIVE 09/10/2017 2122   Sepsis Labs: @LABRCNTIP (procalcitonin:4,lacticidven:4)  ) Recent Results (from the past 240 hour(s))  Culture, blood (x 2)     Status: None   Collection Time: 09/04/17 10:30 PM  Result Value Ref Range Status   Specimen Description BLOOD LEFT ARM  Final   Special Requests   Final    BOTTLES DRAWN AEROBIC AND ANAEROBIC Blood Culture adequate volume   Culture NO GROWTH 5 DAYS  Final   Report Status 09/10/2017 FINAL  Final  Culture, blood (x 2)     Status: None   Collection Time: 09/04/17 10:38 PM  Result Value Ref Range Status   Specimen Description BLOOD RIGHT ARM  Final   Special Requests IN PEDIATRIC BOTTLE Blood Culture adequate volume  Final   Culture NO GROWTH 5 DAYS  Final   Report Status 09/10/2017 FINAL  Final  MRSA PCR Screening     Status: None   Collection Time: 09/08/17  10:31 PM  Result Value Ref Range Status   MRSA by PCR NEGATIVE NEGATIVE Final    Comment:        The GeneXpert MRSA Assay (FDA approved for NASAL specimens only), is one component of a comprehensive MRSA colonization surveillance program. It is not intended to diagnose MRSA infection nor to guide or monitor treatment for MRSA infections.   Culture, respiratory (NON-Expectorated)     Status: None   Collection Time: 09/09/17  2:56 PM  Result Value Ref Range Status   Specimen Description BRONCHIAL ALVEOLAR LAVAGE  Final   Special Requests NONE  Final   Gram Stain   Final    RARE WBC PRESENT, PREDOMINANTLY MONONUCLEAR NO ORGANISMS SEEN    Culture NO GROWTH 2 DAYS  Final   Report Status 09/12/2017 FINAL  Final  Culture, fungus without smear     Status: None (Preliminary result)   Collection Time: 09/09/17  4:04 PM  Result Value Ref Range Status   Specimen Description BRONCHIAL WASHINGS  Final   Special Requests NONE  Final   Culture NO FUNGUS ISOLATED AFTER 3 DAYS  Final   Report Status PENDING  Incomplete  Acid Fast Smear (AFB)     Status: None   Collection Time: 09/09/17  4:04 PM  Result Value Ref Range Status   AFB Specimen Processing Concentration  Final   Acid Fast Smear Negative  Final    Comment: (NOTE) Performed At: [facility] [place], [place] [id number] Rush Farmer MD [id number]    Source (AFB) BRONCHIAL WASHINGS  Final  Anaerobic culture     Status: None (Preliminary result)   Collection Time: 09/09/17  4:04 PM  Result Value Ref Range Status   Specimen Description BRONCHIAL WASHINGS  Final   Special Requests NONE  Final   Culture NO GROWTH 2 DAYS  Final   Report Status PENDING  Incomplete  Culture, respiratory (NON-Expectorated)     Status: None   Collection Time: 09/09/17  4:04 PM  Result Value Ref Range Status   Specimen Description BRONCHIAL WASHINGS  Final   Special Requests NONE  Final   Gram Stain   Final    MODERATE  WBC PRESENT, PREDOMINANTLY PMN ABUNDANT GRAM POSITIVE COCCI IN PAIRS IN CHAINS FEW GRAM NEGATIVE COCCOBACILLI    Culture NO GROWTH 2 DAYS  Final   Report Status 09/12/2017 FINAL  Final  Aerobic/Anaerobic Culture (surgical/deep wound)     Status: None (Preliminary result)   Collection Time: 09/09/17  4:26 PM  Result Value Ref Range Status   Specimen Description PLEURAL  Final   Special Requests PLEURAL EFFUSION EXDUTE PT ON ZINACEF VANC ZOCYIN  Final   Gram Stain   Final    FEW WBC PRESENT, PREDOMINANTLY PMN FEW GRAM POSITIVE COCCI IN PAIRS IN CHAINS    Culture   Final    NO GROWTH 3 DAYS NO ANAEROBES ISOLATED; CULTURE IN PROGRESS FOR 5 DAYS   Report Status PENDING  Incomplete         Radiology Studies: Dg Chest Port 1 View  Result Date: 09/12/2017 CLINICAL DATA:  Followup chest tubes for empyema. EXAM: PORTABLE CHEST 1 VIEW COMPARISON:  09/11/2017 FINDINGS: One left chest tube is been removed. Two left chest tubes remain in place. Small amount of pleural air remains evident at the apex. Persistent pleural density and partial atelectasis of the left lung. Right chest remains clear. Central line unchanged. IMPRESSION: One of the left chest tubes has been removed. Small amount of pleural air remains at the apex. Similar appearance of persistent pleural density and volume loss in the left lung. Electronically Signed   By: Nelson Chimes M.D.   On: 09/12/2017 07:25   Dg Chest Port 1 View  Result Date: 09/11/2017 CLINICAL DATA:  Followup chest tube.  Empyema. EXAM: PORTABLE CHEST 1 VIEW COMPARISON:  09/10/2017 FINDINGS: Multiple left chest tubes remain in place. Left internal jugular central line remains with tip at the azygos level. Right chest remains clear. Persistent pleural and parenchymal density on the left, with slightly less pleural density. Possible bulb bowl of pleural air at the left apex. No significant pneumothorax. IMPRESSION: Less pleural density on the left. Question bubble  of air at the left pleural apex. No significant pneumothorax. Electronically Signed   By: Jan Fireman.D.  On: 09/11/2017 08:44        Scheduled Meds: . acetaminophen  1,000 mg Oral Q6H   Or  . acetaminophen (TYLENOL) oral liquid 160 mg/5 mL  1,000 mg Oral Q6H  . aspirin EC  81 mg Oral Daily  . bisacodyl  10 mg Oral Daily  . Chlorhexidine Gluconate Cloth  6 each Topical Daily  . enoxaparin (LOVENOX) injection  40 mg Subcutaneous Q24H  . feeding supplement  237 mL Oral TID WC  . feeding supplement (ENSURE ENLIVE)  237 mL Oral BID BM  . fentaNYL   Intravenous Q4H  . levalbuterol  0.63 mg Nebulization TID  . mineral oil  60 mL Oral Q1500  . nortriptyline  25 mg Oral QHS  . pregabalin  150 mg Oral BID  . senna-docusate  1 tablet Oral QHS  . simvastatin  40 mg Oral QPM  . sodium chloride flush  10-40 mL Intracatheter Q12H   Continuous Infusions: . sodium chloride 10 mL/hr at 09/11/17 1200  . piperacillin-tazobactam (ZOSYN)  IV Stopped (09/12/17 0911)  . potassium chloride    . potassium chloride 10 mEq (09/12/17 1147)  . vancomycin 750 mg (09/12/17 0941)     LOS: 8 days    Time spent: 10 minutes    Edwin Dada, MD [facility] Pager [phone]  If 7PM-7AM, please contact night-coverage [email or web address] Password [facility] 09/12/2017, 12:55 PM

## 2017-09-12 NOTE — Care Management Note (Signed)
Case Management Note  Patient Details  Name: [name] MRN: [id number] Date of Birth: August 31, 1933  Subjective/Objective: Pt admitted with pl. Effusion, s/p VATS on 12/17.  12/20 1513 Tomi Bamberger RN, BSN - POD 2 VATS, Decortication for empyema, renal function worsening, has severe PCM, nutrition consulted, conts on iv abx, fentanyl pca, chest tube, transferred to SDU.  Plan home with [facility].                                  Action/Plan: PTA pt lived at home with spouse- PCP- Hulan Fess, CM to follow     Expected Discharge Date:                  Expected Discharge Plan:  [place]  In-House Referral:     Discharge planning Services  CM Consult  Post Acute Care Choice:    Choice offered to:     DME Arranged:    DME Agency:     HH Arranged:    HH Agency:     Status of Service:  In process, will continue to follow  If discussed at Long Length of Stay Meetings, dates discussed:    Additional Comments:  Zenon Mayo, RN 09/12/2017, 3:12 PM

## 2017-09-12 NOTE — Progress Notes (Addendum)
[address]       [employer] [address]             [phone]      3 Days Post-Op Procedure(s) (LRB): VIDEO ASSISTED THORACOSCOPY (VATS)/DECORTICATION (Left) DRAINAGE OF PLEURAL EFFUSION (Left) VIDEO BRONCHOSCOPY Subjective: + nausea, + BM yesterday  Objective: Vital signs in last 24 hours: Temp:  [97.8 F (36.6 C)-98.9 F (37.2 C)] 97.8 F (36.6 C) (12/20 0412) Pulse Rate:  [79-108] 102 (12/20 0412) Cardiac Rhythm: Normal sinus rhythm (12/20 0700) Resp:  [12-20] 15 (12/20 0510) BP: (99-127)/(49-98) 121/77 (12/20 0412) SpO2:  [94 %-100 %] 100 % (12/20 0510)  Hemodynamic parameters for last 24 hours:    Intake/Output from previous day: 12/19 0701 - 12/20 0700 In: 1870 [P.O.:120; I.V.:1350; IV Piggyback:400] Out: 513 [Urine:415; Chest Tube:98] Intake/Output this shift: No intake/output data recorded.  General appearance: alert, cooperative and no distress Heart: regular rate and rhythm Lungs: clear to auscultation bilaterally Abdomen: benign Extremities: no edema or calf tenderness Wound: dressings CDI  Lab Results: Recent Labs    09/11/17 0427 09/12/17 0507  WBC 13.8* 11.2*  HGB 7.8* 7.5*  HCT 25.0* 23.9*  PLT 344 346   BMET:  Recent Labs    09/11/17 0427 09/12/17 0507  NA 140 138  K 3.8 2.9*  CL 107 105  CO2 26 25  GLUCOSE 99 99  BUN 15 16  CREATININE 1.40* 1.36*  CALCIUM 8.1* 8.0*    PT/INR: No results for input(s): LABPROT, INR in the last 72 hours. ABG    Component Value Date/Time   PHART 7.418 09/10/2017 0533   HCO3 23.5 09/10/2017 0533   TCO2 25 09/10/2017 0533   ACIDBASEDEF 1.0 09/10/2017 0533   O2SAT 97.0 09/10/2017 0533   CBG (last 3)  No results for input(s): GLUCAP in the last 72 hours.  Meds Scheduled Meds: . acetaminophen  1,000 mg Oral Q6H   Or  . acetaminophen (TYLENOL) oral liquid 160 mg/5 mL  1,000 mg Oral Q6H  . aspirin EC  81 mg Oral Daily  . bisacodyl  10 mg Oral Daily  . Chlorhexidine  Gluconate Cloth  6 each Topical Daily  . enoxaparin (LOVENOX) injection  40 mg Subcutaneous Q24H  . feeding supplement  237 mL Oral TID WC  . feeding supplement (ENSURE ENLIVE)  237 mL Oral BID BM  . fentaNYL   Intravenous Q4H  . levalbuterol  0.63 mg Nebulization TID  . mineral oil  60 mL Oral Q1500  . nortriptyline  25 mg Oral QHS  . pregabalin  150 mg Oral BID  . senna-docusate  1 tablet Oral QHS  . simvastatin  40 mg Oral QPM  . sodium chloride flush  10-40 mL Intracatheter Q12H   Continuous Infusions: . sodium chloride 10 mL/hr at 09/11/17 1200  . piperacillin-tazobactam (ZOSYN)  IV 3.375 g (09/12/17 0511)  . potassium chloride    . vancomycin Stopped (09/11/17 2249)   PRN Meds:.diphenhydrAMINE **OR** diphenhydrAMINE, fentaNYL (SUBLIMAZE) injection, HYDROmorphone, naloxone **AND** sodium chloride flush, ondansetron (ZOFRAN) IV, potassium chloride, senna, sodium chloride flush, tiZANidine  Xrays Dg Chest Port 1 View  Result Date: 09/12/2017 CLINICAL DATA:  Followup chest tubes for empyema. EXAM: PORTABLE CHEST 1 VIEW COMPARISON:  09/11/2017 FINDINGS: One left chest tube is been removed. Two left chest tubes remain in place. Small amount of pleural air remains evident at the apex. Persistent pleural density and partial atelectasis of the left lung. Right chest remains  clear. Central line unchanged. IMPRESSION: One of the left chest tubes has been removed. Small amount of pleural air remains at the apex. Similar appearance of persistent pleural density and volume loss in the left lung. Electronically Signed   By: Nelson Chimes M.D.   On: 09/12/2017 07:25   Dg Chest Port 1 View  Result Date: 09/11/2017 CLINICAL DATA:  Followup chest tube.  Empyema. EXAM: PORTABLE CHEST 1 VIEW COMPARISON:  09/10/2017 FINDINGS: Multiple left chest tubes remain in place. Left internal jugular central line remains with tip at the azygos level. Right chest remains clear. Persistent pleural and parenchymal  density on the left, with slightly less pleural density. Possible bulb bowl of pleural air at the left apex. No significant pneumothorax. IMPRESSION: Less pleural density on the left. Question bubble of air at the left pleural apex. No significant pneumothorax. Electronically Signed   By: Nelson Chimes M.D.   On: 09/11/2017 08:44   Results for orders placed or performed during the hospital encounter of 09/04/17  Culture, blood (x 2)     Status: None   Collection Time: 09/04/17 10:30 PM  Result Value Ref Range Status   Specimen Description BLOOD LEFT ARM  Final   Special Requests   Final    BOTTLES DRAWN AEROBIC AND ANAEROBIC Blood Culture adequate volume   Culture NO GROWTH 5 DAYS  Final   Report Status 09/10/2017 FINAL  Final  Culture, blood (x 2)     Status: None   Collection Time: 09/04/17 10:38 PM  Result Value Ref Range Status   Specimen Description BLOOD RIGHT ARM  Final   Special Requests IN PEDIATRIC BOTTLE Blood Culture adequate volume  Final   Culture NO GROWTH 5 DAYS  Final   Report Status 09/10/2017 FINAL  Final  MRSA PCR Screening     Status: None   Collection Time: 09/08/17 10:31 PM  Result Value Ref Range Status   MRSA by PCR NEGATIVE NEGATIVE Final    Comment:        The GeneXpert MRSA Assay (FDA approved for NASAL specimens only), is one component of a comprehensive MRSA colonization surveillance program. It is not intended to diagnose MRSA infection nor to guide or monitor treatment for MRSA infections.   Culture, respiratory (NON-Expectorated)     Status: None (Preliminary result)   Collection Time: 09/09/17  2:56 PM  Result Value Ref Range Status   Specimen Description BRONCHIAL ALVEOLAR LAVAGE  Final   Special Requests NONE  Final   Gram Stain   Final    RARE WBC PRESENT, PREDOMINANTLY MONONUCLEAR NO ORGANISMS SEEN    Culture NO GROWTH 2 DAYS  Final   Report Status PENDING  Incomplete  Culture, fungus without smear     Status: None (Preliminary result)     Collection Time: 09/09/17  4:04 PM  Result Value Ref Range Status   Specimen Description BRONCHIAL WASHINGS  Final   Special Requests NONE  Final   Culture NO FUNGUS ISOLATED AFTER 2 DAYS  Final   Report Status PENDING  Incomplete  Acid Fast Smear (AFB)     Status: None   Collection Time: 09/09/17  4:04 PM  Result Value Ref Range Status   AFB Specimen Processing Concentration  Final   Acid Fast Smear Negative  Final    Comment: (NOTE) Performed At: [facility] [place], [place] [id number] Rush Farmer MD [id number]    Source (AFB) BRONCHIAL WASHINGS  Final  Anaerobic culture  Status: None (Preliminary result)   Collection Time: 09/09/17  4:04 PM  Result Value Ref Range Status   Specimen Description BRONCHIAL WASHINGS  Final   Special Requests NONE  Final   Culture NO GROWTH 2 DAYS  Final   Report Status PENDING  Incomplete  Culture, respiratory (NON-Expectorated)     Status: None (Preliminary result)   Collection Time: 09/09/17  4:04 PM  Result Value Ref Range Status   Specimen Description BRONCHIAL WASHINGS  Final   Special Requests NONE  Final   Gram Stain   Final    MODERATE WBC PRESENT, PREDOMINANTLY PMN ABUNDANT GRAM POSITIVE COCCI IN PAIRS IN CHAINS FEW GRAM NEGATIVE COCCOBACILLI    Culture CULTURE REINCUBATED FOR BETTER GROWTH  Final   Report Status PENDING  Incomplete  Aerobic/Anaerobic Culture (surgical/deep wound)     Status: None (Preliminary result)   Collection Time: 09/09/17  4:26 PM  Result Value Ref Range Status   Specimen Description PLEURAL  Final   Special Requests PLEURAL EFFUSION EXDUTE PT ON ZINACEF VANC ZOCYIN  Final   Gram Stain   Final    FEW WBC PRESENT, PREDOMINANTLY PMN FEW GRAM POSITIVE COCCI IN PAIRS IN CHAINS    Culture NO GROWTH 2 DAYS  Final   Report Status PENDING  Incomplete   Assessment/Plan: S/P Procedure(s) (LRB): VIDEO ASSISTED THORACOSCOPY (VATS)/DECORTICATION (Left) DRAINAGE OF PLEURAL  EFFUSION (Left) VIDEO BRONCHOSCOPY  1 hemodyn stable in sinus rhythm 2 108 cc CT drainage- poss d/c another tube today, CXR pretty stable in appearance, no air leak, will place on H2O seal 3 push rehab and pulm toilet 4 zofran for nausea prn 5 H/H is approaching transfusion threshhold, cont to monitor closely 6 cont current abx, cultures neg so far   LOS: 8 days    John Giovanni 09/12/2017 Hypokalemia- will give 4 runs of IV KCL CT to water seal Ordered anterior CT removed yesterday- posterior tube was removed instead. No adverse effects Will keep both CT in place on water seal today- if < 150 and no air leak- dc both tomorrow Cultures still no growth despite GCP in pairs and chains on gram stain- likely strep WBC trending down- will hold off on changing to PO antibiotics until nausea resolved  Remo Lipps C. Roxan Hockey, MD [facility] [phone]

## 2017-09-12 NOTE — Progress Notes (Signed)
CRITICAL VALUE ALERT  Critical Value: Vancomycin trough 31  Date & Time Notied: 09/12/17  2247  Provider Notified:  On call [place]  Orders Received/Actions taken: Pharmacy called and d/c abx. Advised RN not to give 2200 scheduled dose. Will continue to monitor.

## 2017-09-12 NOTE — Progress Notes (Signed)
Pharmacy Antibiotic Note  Aaron Schmidt is a 83 y.o. male admitted on 09/04/2017 with sepsis and pleural effusion vs HCAP.  Pharmacy has been consulted for vancomycin and zosyn dosing - day 7 of broad-spectrum antibiotics.  Previously, vancomycin trough was within goal range at 17 with Scr ~1. SCr has trended up slightly. Trough rechecked today and is elevated at 45mg/mL- confirmed with RN KBurnett Kanaristhat dose has not yet been hung.   Plan: -Zosyn 3.375g IV q8h (4-hr infusion) -Reduce vancomycin to 750 mg IV q24h starting with dose on 12/21 at noon to allow time for level to trend down. Goal trough 15-20 -Monitor renal function closely - may need to recheck vancomycin random level if continues to worsen -Follow culture results, clinical status, LOT/de-escalation plans >> will defer to team on duration of broad-sepctrum antibiotics based on culture results  Height: _0  (177.8 cm) Weight: 191 lb 2.2 oz (86.7 kg) IBW/kg (Calculated) : 73  Temp (24hrs), Avg:98.1 F (36.7 C), Min:97.7 F (36.5 C), Max:98.9 F (37.2 C)  Recent Labs  Lab 09/08/17 0916  09/09/17 0714 09/09/17 1000 09/09/17 2130 09/10/17 0518 09/11/17 0427 09/12/17 0507 09/12/17 2133  WBC  --    < >  --  13.6* 23.7* 22.2* 13.8* 11.2*  --   CREATININE  --    < > 0.91  --  1.16 1.22 1.40* 1.36*  --   VANCOTROUGH 17  --   --   --   --   --   --   --  31*   < > = values in this interval not displayed.    Estimated Creatinine Clearance: 43.2 mL/min (A) (by C-G formula based on SCr of 1.36 mg/dL (H)).    Allergies  Allergen Reactions  . Diazepam Other (See Comments)    "makes goofy"    Antimicrobials this admission: Vancomycin 12/13>> Zosyn 12/13>>  Dose adjustments this admission: 12/14: Vancomycin adjusted from 1250 mg IV q 24 to 750 mg IV q 12 in light of improving renal function 12/20: vancomycin trough = 117m/mL > decreased from 75067mV q12h to 750m70m q24h  Microbiology results: 12/17 Bronch washing:  few gram negative coccobacilli, no fungus isolated 12/17 BAL: few GN cocoobacilli- negative, final 12/17 L pleural effusion exdate: NGTD 12/12 blood x 2: negative 11/27 pleural fluid: negative  Thank you for allowing pharmacy to be a part of this patient's care.  Lauren D. Bajbus, PharmD, BCPSDavidsonnical Pharmacist [phone] 10:53 PM

## 2017-09-13 ENCOUNTER — Inpatient Hospital Stay (HOSPITAL_COMMUNITY): Payer: Medicare Other

## 2017-09-13 LAB — TYPE AND SCREEN
ABO/RH(D): A POS
Antibody Screen: NEGATIVE
Unit division: 0
Unit division: 0

## 2017-09-13 LAB — BPAM RBC
Blood Product Expiration Date: 201901082359
Blood Product Expiration Date: 201901092359
ISSUE DATE / TIME: 201812171609
ISSUE DATE / TIME: 201812171609
Unit Type and Rh: 6200
Unit Type and Rh: 6200

## 2017-09-13 LAB — BASIC METABOLIC PANEL
Anion gap: 5 (ref 5–15)
BUN: 13 mg/dL (ref 6–20)
CO2: 26 mmol/L (ref 22–32)
Calcium: 8 mg/dL — ABNORMAL LOW (ref 8.9–10.3)
Chloride: 107 mmol/L (ref 101–111)
Creatinine, Ser: 1.35 mg/dL — ABNORMAL HIGH (ref 0.61–1.24)
GFR calc Af Amer: 55 mL/min — ABNORMAL LOW (ref >60)
GFR calc non Af Amer: 47 mL/min — ABNORMAL LOW (ref >60)
Glucose, Bld: 91 mg/dL (ref 65–99)
Potassium: 3.3 mmol/L — ABNORMAL LOW (ref 3.5–5.1)
Sodium: 138 mmol/L (ref 135–145)

## 2017-09-13 MED ORDER — ONDANSETRON HCL 4 MG/2ML IJ SOLN
4.0000 mg | Freq: Four times a day (QID) | INTRAMUSCULAR | Status: DC
  Administered 2017-09-13: 4 mg via INTRAVENOUS
  Filled 2017-09-13: qty 2

## 2017-09-13 MED ORDER — MAGNESIUM SULFATE 2 GM/50ML IV SOLN
2.0000 g | Freq: Once | INTRAVENOUS | Status: AC
  Administered 2017-09-13: 2 g via INTRAVENOUS
  Filled 2017-09-13: qty 50

## 2017-09-13 MED ORDER — POTASSIUM CHLORIDE CRYS ER 20 MEQ PO TBCR
40.0000 meq | EXTENDED_RELEASE_TABLET | Freq: Two times a day (BID) | ORAL | Status: DC
  Administered 2017-09-13: 40 meq via ORAL
  Filled 2017-09-13: qty 2

## 2017-09-13 MED ORDER — SENNOSIDES-DOCUSATE SODIUM 8.6-50 MG PO TABS: 1.0000 | ORAL_TABLET | Freq: Every evening | ORAL | Status: DC | PRN

## 2017-09-13 MED ORDER — ONDANSETRON HCL 4 MG/2ML IJ SOLN: 4.0000 mg | Freq: Four times a day (QID) | INTRAMUSCULAR | Status: DC | PRN

## 2017-09-13 MED ORDER — POTASSIUM CHLORIDE CRYS ER 20 MEQ PO TBCR
40.0000 meq | EXTENDED_RELEASE_TABLET | Freq: Once | ORAL | Status: AC
  Administered 2017-09-13: 40 meq via ORAL
  Filled 2017-09-13: qty 2

## 2017-09-13 MED ORDER — BISMUTH SUBSALICYLATE 262 MG/15ML PO SUSP
30.0000 mL | ORAL | Status: DC | PRN
  Filled 2017-09-13: qty 118

## 2017-09-13 MED ORDER — PROCHLORPERAZINE EDISYLATE 5 MG/ML IJ SOLN
5.0000 mg | Freq: Four times a day (QID) | INTRAMUSCULAR | Status: DC | PRN
  Administered 2017-09-13: 5 mg via INTRAVENOUS
  Filled 2017-09-13: qty 2

## 2017-09-13 MED ORDER — AMOXICILLIN-POT CLAVULANATE 875-125 MG PO TABS
1.0000 | ORAL_TABLET | Freq: Two times a day (BID) | ORAL | Status: DC
  Administered 2017-09-13 – 2017-09-14 (×3): 1 via ORAL
  Filled 2017-09-13 (×3): qty 1

## 2017-09-13 NOTE — Progress Notes (Signed)
PROGRESS NOTE    Aaron JACINTO  WHQ:[id number] DOB: December 24, 1932 DOA: 09/04/2017 PCP: Hulan Fess, MD      Brief Narrative:  83 yo M with HTN, HFpEF who presented with loculated pleural effusion, went to [facility] on 12/17 with Dr. Roxan Hockey.   Assessment & Plan:  Principal Problem:   Pleural effusion, left Active Problems:   Essential hypertension   SIRS (systemic inflammatory response syndrome) (HCC)   Elevated troponin   Normochromic normocytic anemia   Hyponatremia   Acute kidney injury superimposed on chronic kidney disease (HCC)   Chronic pain   Empyema lung (HCC)   Protein-calorie malnutrition, severe   Left Pleural effusion Empyema [facility] on 12/17 by Dr. Roxan Hockey. Chest tubes out. -[facility]TS has switched to oral antibiotics -Chest tube care per [facility]   Hyponatremia, resolved  Hypokalemia NSVT Prolonged QTc Mag given.   -Continue K -Repeat BMP tomorrow  Chronic normocytic normochromic anemia -Transfusion threshold 7 g/dL  Severe protein calorie malnutrition -Consult to nutrition  Chronic lower back pain, chronic opioid use -Continue Lyrica, tizanidine, nortriptyline  Essential hypertension HCTZ on hold. -Continue statin  Other medications -Continue finasteride     DVT prophylaxis: Lovenox Code Status: FULL Family Communication: None present Disposition Plan: Per [facility], likely tomorrow per their note.   Consultants:   [facility]  Procedures:   [facility] 12/17  Antimicrobials:   Vanc 12/12 >> 12/21  Zosyn 12/12 >> 12/21  Augmentin 12/21 >>   Subjective: Still upset stomach and now loose BMs, x4 today.  Crampy abdomen, poor appetite.  No fever, chills, vomiting.  No chest pain, dyspnea.  Objective: Vitals:   09/13/17 0430 09/13/17 0731 09/13/17 1105 09/13/17 1526  BP:  132/66 127/67   Pulse:  93 94 96  Resp: 14 18 16 19   Temp:  98 F (36.7 C) 98.2 F (36.8 C) 98.2 F (36.8 C)  TempSrc:  Oral Oral Oral  SpO2: 95% 95% 98% 94%    Weight:      Height:        Intake/Output Summary (Last 24 hours) at 09/13/2017 1527 Last data filed at 09/13/2017 1304 Gross per 24 hour  Intake 442 ml  Output 610 ml  Net -168 ml   Filed Weights   09/05/17 0500 09/09/17 2200 09/11/17 0630  Weight: 84.2 kg (185 lb 10 oz) 87.1 kg (192 lb 0.3 oz) 86.7 kg (191 lb 2.2 oz)    Examination: General appearance: Elderly adult male, alert, tired, sitting in a chair, interactive.   Cardiac: RRR, nl S1-S2, no murmurs appreciated.  Respiratory: Normal respiratory rate and rhythm.  Diminsiehd left.  Right side good air movement, no rales, wheezes. Abdomen: No focal tenderness, slightly tender thorughout, no guarding or rebound.  Bowel sounds hyperactive still. Neuro: Awake and alert.  EOMI, moves all extremities. Speech fluent.       Data Reviewed: I have personally reviewed following labs and imaging studies:  CBC: Recent Labs  Lab 09/09/17 1000 09/09/17 2130 09/10/17 0518 09/11/17 0427 09/12/17 0507  WBC 13.6* 23.7* 22.2* 13.8* 11.2*  HGB 9.2* 9.2* 8.5* 7.8* 7.5*  HCT 28.0* 28.5* 26.4* 25.0* 23.9*  MCV 94.6 93.8 92.6 94.0 93.4  PLT 356 406* 364 344 659   Basic Metabolic Panel: Recent Labs  Lab 09/09/17 2130 09/10/17 0518 09/11/17 0427 09/12/17 0507 09/12/17 1648 09/13/17 0509  NA 136 138 140 138  --  138  K 3.8 4.0 3.8 2.9*  --  3.3*  CL 104 104 107 105  --  107  CO2 21* 24 26 25   --  26  GLUCOSE 184* 186* 99 99  --  91  BUN 12 14 15 16   --  13  CREATININE 1.16 1.22 1.40* 1.36*  --  1.35*  CALCIUM 8.1* 8.1* 8.1* 8.0*  --  8.0*  MG  --   --   --   --  1.7  --    GFR: Estimated Creatinine Clearance: 43.6 mL/min (A) (by C-G formula based on SCr of 1.35 mg/dL (H)). Liver Function Tests: Recent Labs  Lab 09/09/17 0714 09/11/17 0427  AST 70* 38  ALT 70* 53  ALKPHOS 121 91  BILITOT 0.7 0.4  PROT 5.2* 5.1*  ALBUMIN 1.7* 1.7*   No results for input(s): LIPASE, AMYLASE in the last 168 hours. No results for  input(s): AMMONIA in the last 168 hours. Coagulation Profile: Recent Labs  Lab 09/09/17 0714  INR 1.24   Cardiac Enzymes: No results for input(s): CKTOTAL, CKMB, CKMBINDEX, TROPONINI in the last 168 hours. BNP (last 3 results) No results for input(s): PROBNP in the last 8760 hours. HbA1C: No results for input(s): HGBA1C in the last 72 hours. CBG: Recent Labs  Lab 09/08/17 0836  GLUCAP 100*   Lipid Profile: No results for input(s): CHOL, HDL, LDLCALC, TRIG, CHOLHDL, LDLDIRECT in the last 72 hours. Thyroid Function Tests: No results for input(s): TSH, T4TOTAL, FREET4, T3FREE, THYROIDAB in the last 72 hours. Anemia Panel: No results for input(s): VITAMINB12, FOLATE, FERRITIN, TIBC, IRON, RETICCTPCT in the last 72 hours. Urine analysis:    Component Value Date/Time   COLORURINE STRAW (A) 09/10/2017 2122   APPEARANCEUR CLEAR 09/10/2017 2122   LABSPEC 1.005 09/10/2017 2122   PHURINE 5.0 09/10/2017 2122   GLUCOSEU NEGATIVE 09/10/2017 2122   HGBUR SMALL (A) 09/10/2017 2122   BILIRUBINUR NEGATIVE 09/10/2017 2122   KETONESUR NEGATIVE 09/10/2017 2122   PROTEINUR NEGATIVE 09/10/2017 2122   UROBILINOGEN 0.2 10/20/2010 1625   NITRITE NEGATIVE 09/10/2017 2122   LEUKOCYTESUR NEGATIVE 09/10/2017 2122   Sepsis Labs: @LABRCNTIP (procalcitonin:4,lacticidven:4)  ) Recent Results (from the past 240 hour(s))  Culture, blood (x 2)     Status: None   Collection Time: 09/04/17 10:30 PM  Result Value Ref Range Status   Specimen Description BLOOD LEFT ARM  Final   Special Requests   Final    BOTTLES DRAWN AEROBIC AND ANAEROBIC Blood Culture adequate volume   Culture NO GROWTH 5 DAYS  Final   Report Status 09/10/2017 FINAL  Final  Culture, blood (x 2)     Status: None   Collection Time: 09/04/17 10:38 PM  Result Value Ref Range Status   Specimen Description BLOOD RIGHT ARM  Final   Special Requests IN PEDIATRIC BOTTLE Blood Culture adequate volume  Final   Culture NO GROWTH 5 DAYS  Final    Report Status 09/10/2017 FINAL  Final  MRSA PCR Screening     Status: None   Collection Time: 09/08/17 10:31 PM  Result Value Ref Range Status   MRSA by PCR NEGATIVE NEGATIVE Final    Comment:        The GeneXpert MRSA Assay (FDA approved for NASAL specimens only), is one component of a comprehensive MRSA colonization surveillance program. It is not intended to diagnose MRSA infection nor to guide or monitor treatment for MRSA infections.   Culture, respiratory (NON-Expectorated)     Status: None   Collection Time: 09/09/17  2:56 PM  Result Value Ref Range Status   Specimen Description  BRONCHIAL ALVEOLAR LAVAGE  Final   Special Requests NONE  Final   Gram Stain   Final    RARE WBC PRESENT, PREDOMINANTLY MONONUCLEAR NO ORGANISMS SEEN    Culture NO GROWTH 2 DAYS  Final   Report Status 09/12/2017 FINAL  Final  Culture, fungus without smear     Status: None (Preliminary result)   Collection Time: 09/09/17  4:04 PM  Result Value Ref Range Status   Specimen Description BRONCHIAL WASHINGS  Final   Special Requests NONE  Final   Culture NO FUNGUS ISOLATED AFTER 3 DAYS  Final   Report Status PENDING  Incomplete  Acid Fast Smear (AFB)     Status: None   Collection Time: 09/09/17  4:04 PM  Result Value Ref Range Status   AFB Specimen Processing Concentration  Final   Acid Fast Smear Negative  Final    Comment: (NOTE) Performed At: [facility] [place], [place] [id number] Rush Farmer MD [id number]    Source (AFB) BRONCHIAL WASHINGS  Final  Anaerobic culture     Status: None (Preliminary result)   Collection Time: 09/09/17  4:04 PM  Result Value Ref Range Status   Specimen Description BRONCHIAL WASHINGS  Final   Special Requests NONE  Final   Culture NO GROWTH 2 DAYS  Final   Report Status PENDING  Incomplete  Culture, respiratory (NON-Expectorated)     Status: None   Collection Time: 09/09/17  4:04 PM  Result Value Ref Range Status   Specimen  Description BRONCHIAL WASHINGS  Final   Special Requests NONE  Final   Gram Stain   Final    MODERATE WBC PRESENT, PREDOMINANTLY PMN ABUNDANT GRAM POSITIVE COCCI IN PAIRS IN CHAINS FEW GRAM NEGATIVE COCCOBACILLI    Culture NO GROWTH 2 DAYS  Final   Report Status 09/12/2017 FINAL  Final  Aerobic/Anaerobic Culture (surgical/deep wound)     Status: None (Preliminary result)   Collection Time: 09/09/17  4:26 PM  Result Value Ref Range Status   Specimen Description PLEURAL  Final   Special Requests PLEURAL EFFUSION EXDUTE PT ON ZINACEF VANC ZOCYIN  Final   Gram Stain   Final    FEW WBC PRESENT, PREDOMINANTLY PMN FEW GRAM POSITIVE COCCI IN PAIRS IN CHAINS    Culture   Final    NO GROWTH 4 DAYS NO ANAEROBES ISOLATED; CULTURE IN PROGRESS FOR 5 DAYS   Report Status PENDING  Incomplete         Radiology Studies: Dg Chest Port 1 View  Result Date: 09/13/2017 CLINICAL DATA:  S/P chest tube removal from left side of chest. Pt c/o chest soreness on left side. No other complaints. EXAM: PORTABLE CHEST 1 VIEW COMPARISON:  09/12/2017 at 5:28 a.m. FINDINGS: No convincing pneumothorax following left chest tube removal. There is scarring at the left apex. Pleural-based opacities noted along the lateral left hemithorax consistent with loculated fluid. There is opacity at the left lung base which is likely combination of pleural fluid and atelectasis. Right lung is hyperexpanded but clear. IMPRESSION: 1. No convincing pneumothorax following left chest tube removal. Electronically Signed   By: Lajean Manes M.D.   On: 09/13/2017 12:26   Dg Chest Port 1 View  Result Date: 09/12/2017 CLINICAL DATA:  Followup chest tubes for empyema. EXAM: PORTABLE CHEST 1 VIEW COMPARISON:  09/11/2017 FINDINGS: One left chest tube is been removed. Two left chest tubes remain in place. Small amount of pleural air remains evident at the apex. Persistent  pleural density and partial atelectasis of the left lung. Right chest  remains clear. Central line unchanged. IMPRESSION: One of the left chest tubes has been removed. Small amount of pleural air remains at the apex. Similar appearance of persistent pleural density and volume loss in the left lung. Electronically Signed   By: Nelson Chimes M.D.   On: 09/12/2017 07:25        Scheduled Meds: . acetaminophen  1,000 mg Oral Q6H   Or  . acetaminophen (TYLENOL) oral liquid 160 mg/5 mL  1,000 mg Oral Q6H  . amoxicillin-clavulanate  1 tablet Oral Q12H  . aspirin EC  81 mg Oral Daily  . bisacodyl  10 mg Oral Daily  . Chlorhexidine Gluconate Cloth  6 each Topical Daily  . enoxaparin (LOVENOX) injection  40 mg Subcutaneous Q24H  . feeding supplement (PRO-STAT SUGAR FREE 64)  30 mL Oral TID BM  . levalbuterol  0.63 mg Nebulization TID  . mineral oil  60 mL Oral Q1500  . multivitamin with minerals  1 tablet Oral Daily  . nortriptyline  25 mg Oral QHS  . pregabalin  150 mg Oral BID  . simvastatin  40 mg Oral QPM  . sodium chloride flush  10-40 mL Intracatheter Q12H   Continuous Infusions: . sodium chloride 10 mL/hr at 09/13/17 0222  . potassium chloride       LOS: 9 days    Time spent: 10 minutes    Edwin Dada, MD [facility] Pager [phone]  If 7PM-7AM, please contact night-coverage [email or web address] Password [facility] 09/13/2017, 3:27 PM

## 2017-09-13 NOTE — Discharge Instructions (Signed)
Video-Assisted Thoracic Surgery, Care After ° °This sheet gives you information about how to care for yourself after your procedure. Your health care provider may also give you more specific instructions. If you have problems or questions, contact your health care provider. °What can I expect after the procedure? °After the procedure, it is common to have: °· Some pain and soreness in your chest. °· Pain when breathing in (inhaling) and coughing. °· Constipation. °· Fatigue. °· Difficulty sleeping. ° °Follow these instructions at home: °Preventing pneumonia °· Take deep breaths or do breathing exercises as instructed by your health care provider. Doing this helps prevent lung infection (pneumonia). °· Cough frequently. Coughing may cause discomfort, but it is important to clear mucus (phlegm) and expand your lungs. If it hurts to cough, hold a pillow against your chest or place the palms of both hands on top of the incision (use splinting) when you cough. This may help relieve discomfort. °· If you were given an incentive spirometer, use it as directed. An incentive spirometer is a tool that measures how well you are filling your lungs with each breath. °· Participate in pulmonary rehabilitation as directed by your health care provider. This is a program that combines education, exercise, and support from a team of specialists. The goal is to help you heal and get back to your normal activities as soon as possible. °Medicines °· Take over-the-counter or prescription medicines only as told by your health care provider. °· If you have pain, take pain-relieving medicine before your pain becomes severe. This is important because if your pain is under control, you will be able to breathe and cough more comfortably. °· If you were prescribed an antibiotic medicine, take it as told by your health care provider. Do not stop taking the antibiotic even if you start to feel better. °Activity °· Ask your health care provider  what activities are safe for you. °· Avoid activities that use your chest muscles for at least 3-4 weeks. °· Do not lift anything that is heavier than 10 lb (4.5 kg), or the limit that your health care provider tells you, until he or she says that it is safe. °Incision care °· Follow instructions from your health care provider about how to take care of your incision(s). Make sure you: °? Wash your hands with soap and water before you change your bandage (dressing). If soap and water are not available, use hand sanitizer. °? Change your dressing as told by your health care provider. °? Leave stitches (sutures), skin glue, or adhesive strips in place. These skin closures may need to stay in place for 2 weeks or longer. If adhesive strip edges start to loosen and curl up, you may trim the loose edges. Do not remove adhesive strips completely unless your health care provider tells you to do that. °· Keep your dressing dry until it has been removed. °· Check your incision area every day for signs of infection. Check for: °? Redness, swelling, or pain. °? Fluid or blood. °? Warmth. °? Pus or a bad smell. °Bathing °· Do not take baths, swim, or use a hot tub until your health care provider approves. You may take showers. °· After your dressing has been removed, use soap and water to gently wash your incision area. Do not use anything else to clean your incision(s) unless your health care provider tells you to do this. °Driving °· Do not drive until your health care provider approves. °· Do not drive   or use heavy machinery while taking prescription pain medicine. °Eating and drinking °· Eat a healthy, balanced diet as instructed by your health care provider. A healthy diet includes plenty of fresh fruits and vegetables, whole grains, and low-fat (lean) proteins. °· Limit foods that are high in fat and processed sugars, such as fried and sweet foods. °· Drink enough fluid to keep your urine clear or pale yellow. °General  instructions °· To prevent or treat constipation while you are taking prescription pain medicine, your health care provider may recommend that you: °? Take over-the-counter or prescription medicines. °? Eat foods that are high in fiber, such as beans, fresh fruits and vegetables, and whole grains. °· Do not use any products that contain nicotine or tobacco, such as cigarettes and e-cigarettes. If you need help quitting, ask your health care provider. °· Avoid secondhand smoke. °· Wear compression stockings as told by your health care provider. These stockings help to prevent blood clots and reduce swelling in your legs. °· If you have a chest tube, care for it as instructed by your health care provider. Do not travel by airplane during the 2 weeks after your chest tube is removed, or until your health care provider says that this is safe. °· Keep all follow-up visits as told by your health care provider. This is important. °Contact a health care provider if: °· You have redness, swelling, or pain around an incision. °· You have fluid or blood coming from an incision. °· Your incision area feels warm to the touch. °· You have pus or a bad smell coming from an incision. °· You have a fever or chills. °· You have nausea or vomiting. °· You have pain that does not get better with medicine. °Get help right away if: °· You have chest pain. °· Your heart is fluttering or beating rapidly. °· You develop a rash. °· You have shortness of breath or trouble breathing. °· You are confused. °· You have trouble speaking. °· You feel weak, light-headed, or dizzy. °· You faint. °Summary °· To help prevent lung infection (pneumonia), take deep breaths or do breathing exercises as instructed by your health care provider. °· Cough frequently to clear mucus (phlegm) and expand your lungs. If it hurts to cough, hold a pillow against your chest or place the palms of both hands on top of the incision (use splinting) when you cough. °· If  you have pain, take pain-relieving medicine before your pain becomes severe. This is important because if your pain is under control, you will be able to breathe and cough more comfortably. °· Ask your health care provider what activities are safe for you. °This information is not intended to replace advice given to you by your health care provider. Make sure you discuss any questions you have with your health care provider. °Document Released: 01/05/2013 Document Revised: 08/20/2016 Document Reviewed: 08/20/2016 °[employer] Interactive Patient Education © 2017 [employer] ° °

## 2017-09-13 NOTE — Progress Notes (Addendum)
[address]       [employer] [address]             [phone]      4 Days Post-Op Procedure(s) (LRB): VIDEO ASSISTED THORACOSCOPY (VATS)/DECORTICATION (Left) DRAINAGE OF PLEURAL EFFUSION (Left) VIDEO BRONCHOSCOPY Subjective: Anxious for discharge  Objective: Vital signs in last 24 hours: Temp:  [97.7 F (36.5 C)-98.2 F (36.8 C)] 97.8 F (36.6 C) (12/21 0407) Pulse Rate:  [90-98] 94 (12/21 0407) Cardiac Rhythm: Normal sinus rhythm (12/20 2301) Resp:  [12-17] 14 (12/21 0430) BP: (113-134)/(53-68) 129/64 (12/21 0407) SpO2:  [94 %-98 %] 95 % (12/21 0430)  Hemodynamic parameters for last 24 hours:    Intake/Output from previous day: 12/20 0701 - 12/21 0700 In: 442 [P.O.:342; I.V.:100] Out: 460 [Urine:450; Chest Tube:10] Intake/Output this shift: No intake/output data recorded.  General appearance: alert, cooperative and no distress Heart: regular rate and rhythm Lungs: somewhat dim in left base with crackles Abdomen: benign Extremities: no edema or calf tenderness Wound: incis healing well  Lab Results: Recent Labs    09/11/17 0427 09/12/17 0507  WBC 13.8* 11.2*  HGB 7.8* 7.5*  HCT 25.0* 23.9*  PLT 344 346   BMET:  Recent Labs    09/12/17 0507 09/13/17 0509  NA 138 138  K 2.9* 3.3*  CL 105 107  CO2 25 26  GLUCOSE 99 91  BUN 16 13  CREATININE 1.36* 1.35*  CALCIUM 8.0* 8.0*    PT/INR: No results for input(s): LABPROT, INR in the last 72 hours. ABG    Component Value Date/Time   PHART 7.418 09/10/2017 0533   HCO3 23.5 09/10/2017 0533   TCO2 25 09/10/2017 0533   ACIDBASEDEF 1.0 09/10/2017 0533   O2SAT 97.0 09/10/2017 0533   CBG (last 3)  No results for input(s): GLUCAP in the last 72 hours.  Meds Scheduled Meds: . acetaminophen  1,000 mg Oral Q6H   Or  . acetaminophen (TYLENOL) oral liquid 160 mg/5 mL  1,000 mg Oral Q6H  . aspirin EC  81 mg Oral Daily  . bisacodyl  10 mg Oral Daily  . Chlorhexidine Gluconate Cloth   6 each Topical Daily  . enoxaparin (LOVENOX) injection  40 mg Subcutaneous Q24H  . feeding supplement (PRO-STAT SUGAR FREE 64)  30 mL Oral TID BM  . fentaNYL   Intravenous Q4H  . levalbuterol  0.63 mg Nebulization TID  . mineral oil  60 mL Oral Q1500  . multivitamin with minerals  1 tablet Oral Daily  . nortriptyline  25 mg Oral QHS  . potassium chloride  40 mEq Oral Once  . pregabalin  150 mg Oral BID  . senna-docusate  1 tablet Oral QHS  . simvastatin  40 mg Oral QPM  . sodium chloride flush  10-40 mL Intracatheter Q12H   Continuous Infusions: . sodium chloride 10 mL/hr at 09/13/17 0222  . magnesium sulfate 1 - 4 g bolus IVPB    . piperacillin-tazobactam (ZOSYN)  IV 3.375 g (09/13/17 0514)  . potassium chloride    . vancomycin     PRN Meds:.diphenhydrAMINE **OR** diphenhydrAMINE, fentaNYL (SUBLIMAZE) injection, HYDROmorphone, naloxone **AND** sodium chloride flush, ondansetron (ZOFRAN) IV, potassium chloride, senna, sodium chloride flush, tiZANidine  Xrays Dg Chest Port 1 View  Result Date: 09/12/2017 CLINICAL DATA:  Followup chest tubes for empyema. EXAM: PORTABLE CHEST 1 VIEW COMPARISON:  09/11/2017 FINDINGS: One left chest tube is been removed. Two left chest tubes remain in place. Small  amount of pleural air remains evident at the apex. Persistent pleural density and partial atelectasis of the left lung. Right chest remains clear. Central line unchanged. IMPRESSION: One of the left chest tubes has been removed. Small amount of pleural air remains at the apex. Similar appearance of persistent pleural density and volume loss in the left lung. Electronically Signed   By: Nelson Chimes M.D.   On: 09/12/2017 07:25   Results for orders placed or performed during the hospital encounter of 09/04/17  Culture, blood (x 2)     Status: None   Collection Time: 09/04/17 10:30 PM  Result Value Ref Range Status   Specimen Description BLOOD LEFT ARM  Final   Special Requests   Final    BOTTLES  DRAWN AEROBIC AND ANAEROBIC Blood Culture adequate volume   Culture NO GROWTH 5 DAYS  Final   Report Status 09/10/2017 FINAL  Final  Culture, blood (x 2)     Status: None   Collection Time: 09/04/17 10:38 PM  Result Value Ref Range Status   Specimen Description BLOOD RIGHT ARM  Final   Special Requests IN PEDIATRIC BOTTLE Blood Culture adequate volume  Final   Culture NO GROWTH 5 DAYS  Final   Report Status 09/10/2017 FINAL  Final  MRSA PCR Screening     Status: None   Collection Time: 09/08/17 10:31 PM  Result Value Ref Range Status   MRSA by PCR NEGATIVE NEGATIVE Final    Comment:        The GeneXpert MRSA Assay (FDA approved for NASAL specimens only), is one component of a comprehensive MRSA colonization surveillance program. It is not intended to diagnose MRSA infection nor to guide or monitor treatment for MRSA infections.   Culture, respiratory (NON-Expectorated)     Status: None   Collection Time: 09/09/17  2:56 PM  Result Value Ref Range Status   Specimen Description BRONCHIAL ALVEOLAR LAVAGE  Final   Special Requests NONE  Final   Gram Stain   Final    RARE WBC PRESENT, PREDOMINANTLY MONONUCLEAR NO ORGANISMS SEEN    Culture NO GROWTH 2 DAYS  Final   Report Status 09/12/2017 FINAL  Final  Culture, fungus without smear     Status: None (Preliminary result)   Collection Time: 09/09/17  4:04 PM  Result Value Ref Range Status   Specimen Description BRONCHIAL WASHINGS  Final   Special Requests NONE  Final   Culture NO FUNGUS ISOLATED AFTER 3 DAYS  Final   Report Status PENDING  Incomplete  Acid Fast Smear (AFB)     Status: None   Collection Time: 09/09/17  4:04 PM  Result Value Ref Range Status   AFB Specimen Processing Concentration  Final   Acid Fast Smear Negative  Final    Comment: (NOTE) Performed At: [facility] [place], [place] [id number] Rush Farmer MD QQ:[id number]    Source (AFB) BRONCHIAL WASHINGS  Final  Anaerobic culture      Status: None (Preliminary result)   Collection Time: 09/09/17  4:04 PM  Result Value Ref Range Status   Specimen Description BRONCHIAL WASHINGS  Final   Special Requests NONE  Final   Culture NO GROWTH 2 DAYS  Final   Report Status PENDING  Incomplete  Culture, respiratory (NON-Expectorated)     Status: None   Collection Time: 09/09/17  4:04 PM  Result Value Ref Range Status   Specimen Description BRONCHIAL WASHINGS  Final   Special Requests NONE  Final   Gram Stain   Final    MODERATE WBC PRESENT, PREDOMINANTLY PMN ABUNDANT GRAM POSITIVE COCCI IN PAIRS IN CHAINS FEW GRAM NEGATIVE COCCOBACILLI    Culture NO GROWTH 2 DAYS  Final   Report Status 09/12/2017 FINAL  Final  Aerobic/Anaerobic Culture (surgical/deep wound)     Status: None (Preliminary result)   Collection Time: 09/09/17  4:26 PM  Result Value Ref Range Status   Specimen Description PLEURAL  Final   Special Requests PLEURAL EFFUSION EXDUTE PT ON ZINACEF VANC ZOCYIN  Final   Gram Stain   Final    FEW WBC PRESENT, PREDOMINANTLY PMN FEW GRAM POSITIVE COCCI IN PAIRS IN CHAINS    Culture   Final    NO GROWTH 3 DAYS NO ANAEROBES ISOLATED; CULTURE IN PROGRESS FOR 5 DAYS   Report Status PENDING  Incomplete   Assessment/Plan: S/P Procedure(s) (LRB): VIDEO ASSISTED THORACOSCOPY (VATS)/DECORTICATION (Left) DRAINAGE OF PLEURAL EFFUSION (Left) VIDEO BRONCHOSCOPY  1 doing better, min CT drainage, no air leak- will d/c tubes today 2 hemodyn stable in sinus rhythm 3 nausea- not tolerating hospital food, abd exam benign, multifactorial 4 creat stable, replace K+ 5 med management per primary, will need to determine time of change from IV abx to po- cultures neg, cont aggressive pulm toilet/nebs, mobilize as able  LOS: 9 days    Aaron Schmidt 09/13/2017 Patient seen and examined, agree with above Dc chest tubes Will change to PO antibiotics Continue ambulation  Remo Lipps C. Roxan Hockey, MD [facility] [phone]  possibly home tomorrow

## 2017-09-13 NOTE — Care Management Note (Signed)
Case Management Note Previous Note Created by Tomi Bamberger  Patient Details  Name: [name] MRN: [id number] Date of Birth: 1933-03-20  Subjective/Objective: Pt admitted with pl. Effusion, s/p VATS on 12/17.  12/20 1513 Tomi Bamberger RN, BSN - POD 2 VATS, Decortication for empyema, renal function worsening, has severe PCM, nutrition consulted, conts on iv abx, fentanyl pca, chest tube, transferred to SDU.  Plan home with [facility].                                  Action/Plan: PTA pt lived at home with spouse- PCP- Hulan Fess, CM to follow     Expected Discharge Date:                  Expected Discharge Plan:  [place]  In-House Referral:     Discharge planning Services  CM Consult  Post Acute Care Choice:    Choice offered to:  Patient, Spouse  DME Arranged:    DME Agency:     HH Arranged:  PT [place]:  [place]  Status of Service:  In process, will continue to follow  If discussed at Long Length of Stay Meetings, dates discussed:    Additional Comments: CM offered choice for recommended HH - chose [facility] - agency will be contacted once order is written - CM requested order from attending Maryclare Labrador, RN 09/13/2017, 3:01 PM

## 2017-09-14 ENCOUNTER — Inpatient Hospital Stay (HOSPITAL_COMMUNITY): Payer: Medicare Other

## 2017-09-14 LAB — BASIC METABOLIC PANEL
Anion gap: 8 (ref 5–15)
BUN: 12 mg/dL (ref 6–20)
CO2: 23 mmol/L (ref 22–32)
Calcium: 8.3 mg/dL — ABNORMAL LOW (ref 8.9–10.3)
Chloride: 108 mmol/L (ref 101–111)
Creatinine, Ser: 1.43 mg/dL — ABNORMAL HIGH (ref 0.61–1.24)
GFR calc Af Amer: 51 mL/min — ABNORMAL LOW (ref >60)
GFR calc non Af Amer: 44 mL/min — ABNORMAL LOW (ref >60)
Glucose, Bld: 95 mg/dL (ref 65–99)
Potassium: 4.3 mmol/L (ref 3.5–5.1)
Sodium: 139 mmol/L (ref 135–145)

## 2017-09-14 LAB — CBC
HCT: 24.7 % — ABNORMAL LOW (ref 39.0–52.0)
Hemoglobin: 7.6 g/dL — ABNORMAL LOW (ref 13.0–17.0)
MCH: 29.1 pg (ref 26.0–34.0)
MCHC: 30.8 g/dL (ref 30.0–36.0)
MCV: 94.6 fL (ref 78.0–100.0)
Platelets: 367 10*3/uL (ref 150–400)
RBC: 2.61 MIL/uL — ABNORMAL LOW (ref 4.22–5.81)
RDW: 15.5 % (ref 11.5–15.5)
WBC: 13.1 10*3/uL — ABNORMAL HIGH (ref 4.0–10.5)

## 2017-09-14 LAB — MAGNESIUM: Magnesium: 2 mg/dL (ref 1.7–2.4)

## 2017-09-14 MED ORDER — AMOXICILLIN-POT CLAVULANATE 875-125 MG PO TABS: 1.0000 | ORAL_TABLET | Freq: Two times a day (BID) | ORAL | 0 refills | Status: AC

## 2017-09-14 MED ORDER — POTASSIUM CHLORIDE CRYS ER 20 MEQ PO TBCR: 40.0000 meq | EXTENDED_RELEASE_TABLET | Freq: Every day | ORAL | Status: DC | PRN

## 2017-09-14 MED ORDER — LEVALBUTEROL HCL 0.63 MG/3ML IN NEBU: 0.6300 mg | INHALATION_SOLUTION | Freq: Two times a day (BID) | RESPIRATORY_TRACT | Status: DC

## 2017-09-14 NOTE — Discharge Summary (Signed)
Physician Discharge Summary  ARIN PERAL ZOX:[id number] DOB: Jun 18, 1933 DOA: 09/04/2017  PCP: Hulan Fess, MD  Admit date: 09/04/2017 Discharge date: 09/14/2017  Admitted From: Home  Disposition:  Home with home health   Recommendations for Outpatient Follow-up:  1. Follow up with PCP in 1 week 2. Please obtain BMP/CBC in one week 3. Follow up with Dr. Roxan Hockey in 3-4 weeks as scheduled  Home Health: Yes  Equipment/Devices: None  Discharge Condition: Improving, still weak  CODE STATUS: FULL Diet recommendation: Regular  Brief/Interim Summary: This is an 83 year old man with hypertension who presented with 83 yo M with HTN, HFpEF who presented to his outpatient physicians with slowly progressive back pain and chest symptoms, found to have loculated pleural effusion, finally developed fever and was admitted to the hospital.  Underwent VATS on 12/17 with Dr. Roxan Hockey.  Had an uneventful postoperative course, discharged on Augmentin to complete 6 weeks.       Discharge Diagnoses:  Principal Problem:   Pleural effusion, left Active Problems:   Essential hypertension   SIRS (systemic inflammatory response syndrome) (HCC)   Elevated troponin   Normochromic normocytic anemia   Hyponatremia   Acute kidney injury superimposed on chronic kidney disease (HCC)   Chronic pain   Empyema lung (HCC)   Protein-calorie malnutrition, severe    Discharge Instructions  Discharge Instructions    Call MD for:  difficulty breathing, headache or visual disturbances   Complete by:  As directed    Call MD for:  persistant nausea and vomiting   Complete by:  As directed    Call MD for:  temperature >100.4   Complete by:  As directed    Diet - low sodium heart healthy   Complete by:  As directed    Diet - low sodium heart healthy   Complete by:  As directed    Discharge instructions   Complete by:  As directed    From Dr. Loleta Books: You were admitted for an "empyema", which  is a form of infection surrounding the lung, causing a thick collection of pus.    Follow up with Dr. Roxan Hockey in 3 weeks at the appointment scheduled for you (listed on this sheet). Take Augmentin 1 tablet twice daily (in the morning and at night) for 5 more weeks  Schedule an appointment to follow up with your primary care doctor for a hospital follow up appointment wtihin one week.  They should check your lab work (a basic metabolic panel and blood counts).  Augmentin may cause some diarrhea, this is normal.  To reduce it, it would be reasonable to take a probiotic while you are taking Augmentin.  Probiotics are healthy bacteria, they are sold over the counter, and there is no evidence that one form of probiotic is better than another, you can discuss with the pharmacist where to find them at the store if you would like to do this.   Increase activity slowly   Complete by:  As directed    Increase activity slowly   Complete by:  As directed      Allergies as of 09/14/2017      Reactions   Diazepam Other (See Comments)   "makes goofy"      Medication List    TAKE these medications   amoxicillin-clavulanate 875-125 MG tablet Commonly known as:  AUGMENTIN Take 1 tablet by mouth every 12 (twelve) hours.   aspirin EC 81 MG tablet Take 81 mg by mouth daily.   b  complex vitamins tablet Take 1 tablet by mouth daily.   docusate sodium 100 MG capsule Commonly known as:  COLACE Take 600 mg by mouth daily.   finasteride 5 MG tablet Commonly known as:  PROSCAR Take 5 mg by mouth daily.   Flax Seed Oil 1000 MG Caps Take 1,000 mg by mouth daily.   fluocinonide cream 0.05 % Commonly known as:  LIDEX Apply 1 application topically daily as needed (itching).   HYDROmorphone 2 MG tablet Commonly known as:  DILAUDID Take 2 mg by mouth 4 (four) times daily as needed for severe pain.   L-Lysine 500 MG Caps Take 500 mg by mouth daily.   mineral oil liquid Take 60 mLs by mouth  daily at 3 pm.   multivitamin capsule Take 1 capsule by mouth daily.   nortriptyline 25 MG capsule Commonly known as:  PAMELOR Take 25 mg by mouth at bedtime.   PERDIEM PO Take 3 tablets by mouth 2 (two) times daily.   pregabalin 150 MG capsule Commonly known as:  LYRICA Take 150 mg by mouth 2 (two) times daily.   pyridOXINE 100 MG tablet Commonly known as:  VITAMIN B-6 Take 100 mg by mouth daily.   saw palmetto 80 MG capsule Take 240 mg by mouth daily.   simvastatin 40 MG tablet Commonly known as:  ZOCOR Take 40 mg by mouth every evening.   tiZANidine 2 MG tablet Commonly known as:  ZANAFLEX Take 2 mg by mouth 3 (three) times daily as needed for muscle spasms.   triamterene-hydrochlorothiazide 37.5-25 MG tablet Commonly known as:  MAXZIDE-25 Take 1 tablet by mouth daily.   vitamin C 500 MG tablet Commonly known as:  ASCORBIC ACID Take 500 mg by mouth 2 (two) times daily.   Vitamin D 2000 units Caps Take 2,000 Units by mouth daily.      Follow-up Information    [name], MD Follow up on 10/08/2017.   Specialty:  Cardiothoracic Surgery Why:  Appointment is at 2:30, please get CXR at 2:00 at [place] located on first floor of our office building Contact information: [place] [address] [phone]        [facility] Follow up on 09/20/2017.   Specialty:  Cardiothoracic Surgery Why:  Appointment is at 11:00 for suture removal if home health nurses will not remove chest tubes sutures Contact information: [place], [place] Follow up.   Specialty:  Home Health Services Why:  RN and Physical therapist will contact you to schedule first visit. Contact information: [place] [address] [phone]          Allergies  Allergen Reactions   . Diazepam Other (See Comments)    "makes goofy"    Consultations:  Cardiothoracic surgery   Procedures/Studies: Dg Chest 1 View  Result Date: 08/20/2017 CLINICAL DATA:  Status post left thoracentesis. EXAM: CHEST 1 VIEW COMPARISON:  Radiographs of August 13, 2017. FINDINGS: Stable cardiomediastinal silhouette. No pneumothorax is noted. Right lung is clear. Moderate left pleural effusion is noted which is increased in size compared to prior exam. Bony thorax is unremarkable. IMPRESSION: Moderate left pleural effusion is noted.  No pneumothorax is seen. Electronically Signed   By: Marijo Conception, M.D.   On: 08/20/2017 13:48   Dg Chest 2 View  Result Date: 09/14/2017  CLINICAL DATA:  S/p VATS 09/09/17; hx of HTN; former pipe smoker EXAM: CHEST  2 VIEW COMPARISON:  09/13/2017 FINDINGS: There has been further improvement in left lung aeration. A portion of the left lung base is no aerated. Right lung remains clear. No pneumothorax. IMPRESSION: 1. Mild improvement in left lung aeration since the previous day's study. No other change. No pneumothorax. Electronically Signed   By: Lajean Manes M.D.   On: 09/14/2017 08:54   Dg Chest 2 View  Result Date: 09/09/2017 CLINICAL DATA:  83 year old male with increasing shortness of breath EXAM: CHEST  2 VIEW COMPARISON:  09/04/2017 and prior chest radiographs FINDINGS: Increasing left pleural effusion and left lung atelectasis/airspace disease noted. There is no evidence of pneumothorax. A small right pleural effusion is present. Minimal right basilar atelectasis identified. No acute bony abnormalities are identified. IMPRESSION: 1. Increasing left pleural effusion and left lung atelectasis/ airspace disease. 2. Small right pleural effusion and minimal right basilar atelectasis. Electronically Signed   By: Margarette Canada M.D.   On: 09/09/2017 09:32   Dg Chest 2 View  Result Date: 09/04/2017 CLINICAL DATA:  Shortness of breath. EXAM: CHEST  2 VIEW  COMPARISON:  Radiograph of August 20, 2017. FINDINGS: Stable cardiomediastinal silhouette. No pneumothorax is noted. Right lung is clear. Stable moderate size loculated left pleural effusion is noted with probable associated left basilar atelectasis or infiltrate. Bony thorax is unremarkable. IMPRESSION: Stable moderate-sized loculated left pleural effusion. Electronically Signed   By: Marijo Conception, M.D.   On: 09/04/2017 14:25   Dg Chest Port 1 View  Result Date: 09/13/2017 CLINICAL DATA:  S/P chest tube removal from left side of chest. Pt c/o chest soreness on left side. No other complaints. EXAM: PORTABLE CHEST 1 VIEW COMPARISON:  09/12/2017 at 5:28 a.m. FINDINGS: No convincing pneumothorax following left chest tube removal. There is scarring at the left apex. Pleural-based opacities noted along the lateral left hemithorax consistent with loculated fluid. There is opacity at the left lung base which is likely combination of pleural fluid and atelectasis. Right lung is hyperexpanded but clear. IMPRESSION: 1. No convincing pneumothorax following left chest tube removal. Electronically Signed   By: Lajean Manes M.D.   On: 09/13/2017 12:26   Dg Chest Port 1 View  Result Date: 09/12/2017 CLINICAL DATA:  Followup chest tubes for empyema. EXAM: PORTABLE CHEST 1 VIEW COMPARISON:  09/11/2017 FINDINGS: One left chest tube is been removed. Two left chest tubes remain in place. Small amount of pleural air remains evident at the apex. Persistent pleural density and partial atelectasis of the left lung. Right chest remains clear. Central line unchanged. IMPRESSION: One of the left chest tubes has been removed. Small amount of pleural air remains at the apex. Similar appearance of persistent pleural density and volume loss in the left lung. Electronically Signed   By: Nelson Chimes M.D.   On: 09/12/2017 07:25   Dg Chest Port 1 View  Result Date: 09/11/2017 CLINICAL DATA:  Followup chest tube.  Empyema. EXAM:  PORTABLE CHEST 1 VIEW COMPARISON:  09/10/2017 FINDINGS: Multiple left chest tubes remain in place. Left internal jugular central line remains with tip at the azygos level. Right chest remains clear. Persistent pleural and parenchymal density on the left, with slightly less pleural density. Possible bulb bowl of pleural air at the left apex. No significant pneumothorax. IMPRESSION: Less pleural density on the left. Question bubble of air at the left pleural apex. No significant pneumothorax. Electronically Signed  By: Nelson Chimes M.D.   On: 09/11/2017 08:44   Dg Chest Port 1 View  Result Date: 09/10/2017 CLINICAL DATA:  Chest tube.  Sore chest.  Shortness of breath . EXAM: PORTABLE CHEST 1 VIEW COMPARISON:  09/09/2017. FINDINGS: Left IJ line, left chest tubes in stable position. Heart size stable. Bilateral pulmonary infiltrates and left-sided pleural effusion. No pneumothorax. Cervical spine fusion. IMPRESSION: 1. Left IJ line and left chest tubes in stable position. No pneumothorax. 2.  Bibasilar pulmonary infiltrates and bilateral pleural effusions. 3. Stable cardiomegaly. Electronically Signed   By: Marcello Moores  Register   On: 09/10/2017 09:48   Dg Chest Port 1 View  Result Date: 09/09/2017 CLINICAL DATA:  Empyema of lung.  Chest tube, central line. EXAM: PORTABLE CHEST 1 VIEW COMPARISON:  Chest x-ray from earlier same day, chest x-ray dated 09/04/2017. FINDINGS: Interval placement of 2 left-sided chest tubes, 1 positioned at the left lung base and the other directed towards the left lung apex. Significantly decreased opacity within the left lung, presumably status post evacuation of the empyema. Small right pleural effusion and/or atelectasis. Left IJ central line in place with tip at the level of the upper SVC. Heart size and mediastinal contours are grossly stable. Atherosclerotic changes noted at the aortic arch. IMPRESSION: 1. Interval placement of 2 left-sided chest tubes. Significantly decreased  opacity within the left lung, presumably related to evacuation of the left lung empyema. 2. Small right pleural effusion and/or atelectasis. 3. Left IJ central line placement with tip at the level of the upper SVC. Would consider advancing towards the lower SVC/cavoatrial junction for optimal radiographic positioning. 4. Aortic atherosclerosis. Electronically Signed   By: Franki Cabot M.D.   On: 09/09/2017 20:38   [place] Cm  Result Date: 08/20/2017 CLINICAL DATA:  83 y/o  M; chronic sinusitis with runny nose. EXAM: CT PARANASAL SINUS LIMITED WITHOUT CONTRAST TECHNIQUE: Multidetector CT images of the paranasal sinuses were obtained using the standard protocol without intravenous contrast. COMPARISON:  None. FINDINGS: Paranasal sinuses: Frontal: Normally aerated. Patent frontal sinus drainage pathways. Ethmoid: Normally aerated. Maxillary: Normally aerated. Sphenoid: Normally aerated. Patent sphenoethmoidal recesses. Right ostiomeatal unit: Patent. Left ostiomeatal unit: Patent. Nasal passages: Patent. Intact nasal septum is midline. Other: Technique does not allow for diagnostic visualization of the orbital, intracranial, and soft tissue structures. IMPRESSION: Normally aerated paranasal sinuses.  Patent sinus drainage pathways. Electronically Signed   By: Kristine Garbe M.D.   On: 08/20/2017 15:27   US Thoracentesis Asp Pleural Space W/img Guide  Result Date: 08/20/2017 INDICATION: Back pain, dyspnea, loculated left pleural effusion; request made for diagnostic and therapeutic left thoracentesis. EXAM: ULTRASOUND GUIDED DIAGNOSTIC AND THERAPEUTIC LEFT THORACENTESIS MEDICATIONS: None. COMPLICATIONS: None immediate. PROCEDURE: An ultrasound guided thoracentesis was thoroughly discussed with the patient and questions answered. The benefits, risks, alternatives and complications were also discussed. The patient understands and wishes to proceed with the procedure. Written consent  was obtained. Ultrasound was performed to localize and mark an adequate pocket of fluid in the left chest. The area was then prepped and draped in the normal sterile fashion. 1% Lidocaine was used for local anesthesia. Under ultrasound guidance a Safe-T-Centesis catheter was introduced. Thoracentesis was performed. The catheter was removed and a dressing applied. FINDINGS: A total of approximately 220 cc of hazy, yellow fluid was removed. Samples were sent to the laboratory as requested by the clinical team. Due to the multiloculated nature of the pleural collection, only the above amount of fluid could  be removed today. IMPRESSION: Successful ultrasound guided diagnostic and therapeutic left thoracentesis yielding 220 cc of pleural fluid. Follow-up chest x-ray revealed no pneumothorax. Read by: Rowe Robert, PA-C Electronically Signed   By: Sandi Mariscal M.D.   On: 08/20/2017 13:58       Subjective: Feels better.  Nausea and abdominal discomfort better.  BMs good.  No new fever, confusion, cough, sputum, chest pain.  Discharge Exam: Vitals:   09/14/17 0805 09/14/17 1119  BP:  (!) 146/73  Pulse:  (!) 102  Resp:  17  Temp:  97.7 F (36.5 C)  SpO2: 97% 98%   Vitals:   09/14/17 0326 09/14/17 0723 09/14/17 0805 09/14/17 1119  BP: 136/67 (!) 150/71  (!) 146/73  Pulse: 99 100  (!) 102  Resp: 17 (!) 21  17  Temp: 98.2 F (36.8 C) 98.4 F (36.9 C)  97.7 F (36.5 C)  TempSrc: Oral Oral  Oral  SpO2: 100% 96% 97% 98%  Weight:      Height:        General: Pt is alert, awake, not in acute distress, tired, but improving Cardiovascular: RRR, S1/S2 +, no rubs, no gallops, pain on left chest Respiratory: CTA bilaterally, no wheezing, no rhonchi, diminished on left Abdominal: Soft, NT, ND, bowel sounds + Extremities: no edema, no cyanosis    The results of significant diagnostics from this hospitalization (including imaging, microbiology, ancillary and laboratory) are listed below for  reference.     Microbiology: Recent Results (from the past 240 hour(s))  Culture, blood (x 2)     Status: None   Collection Time: 09/04/17 10:30 PM  Result Value Ref Range Status   Specimen Description BLOOD LEFT ARM  Final   Special Requests   Final    BOTTLES DRAWN AEROBIC AND ANAEROBIC Blood Culture adequate volume   Culture NO GROWTH 5 DAYS  Final   Report Status 09/10/2017 FINAL  Final  Culture, blood (x 2)     Status: None   Collection Time: 09/04/17 10:38 PM  Result Value Ref Range Status   Specimen Description BLOOD RIGHT ARM  Final   Special Requests IN PEDIATRIC BOTTLE Blood Culture adequate volume  Final   Culture NO GROWTH 5 DAYS  Final   Report Status 09/10/2017 FINAL  Final  MRSA PCR Screening     Status: None   Collection Time: 09/08/17 10:31 PM  Result Value Ref Range Status   MRSA by PCR NEGATIVE NEGATIVE Final    Comment:        The GeneXpert MRSA Assay (FDA approved for NASAL specimens only), is one component of a comprehensive MRSA colonization surveillance program. It is not intended to diagnose MRSA infection nor to guide or monitor treatment for MRSA infections.   Culture, respiratory (NON-Expectorated)     Status: None   Collection Time: 09/09/17  2:56 PM  Result Value Ref Range Status   Specimen Description BRONCHIAL ALVEOLAR LAVAGE  Final   Special Requests NONE  Final   Gram Stain   Final    RARE WBC PRESENT, PREDOMINANTLY MONONUCLEAR NO ORGANISMS SEEN    Culture NO GROWTH 2 DAYS  Final   Report Status 09/12/2017 FINAL  Final  Culture, fungus without smear     Status: None (Preliminary result)   Collection Time: 09/09/17  4:04 PM  Result Value Ref Range Status   Specimen Description BRONCHIAL WASHINGS  Final   Special Requests NONE  Final   Culture NO FUNGUS ISOLATED AFTER  3 DAYS  Final   Report Status PENDING  Incomplete  Acid Fast Smear (AFB)     Status: None   Collection Time: 09/09/17  4:04 PM  Result Value Ref Range Status   AFB  Specimen Processing Concentration  Final   Acid Fast Smear Negative  Final    Comment: (NOTE) Performed At: [facility] [place], [place] [id number] Rush Farmer MD [id number]    Source (AFB) BRONCHIAL WASHINGS  Final  Anaerobic culture     Status: None (Preliminary result)   Collection Time: 09/09/17  4:04 PM  Result Value Ref Range Status   Specimen Description BRONCHIAL WASHINGS  Final   Special Requests NONE  Final   Culture NO GROWTH 2 DAYS HOLDING FOR POSSIBLE ANAEROBE  Final   Report Status PENDING  Incomplete  Culture, respiratory (NON-Expectorated)     Status: None   Collection Time: 09/09/17  4:04 PM  Result Value Ref Range Status   Specimen Description BRONCHIAL WASHINGS  Final   Special Requests NONE  Final   Gram Stain   Final    MODERATE WBC PRESENT, PREDOMINANTLY PMN ABUNDANT GRAM POSITIVE COCCI IN PAIRS IN CHAINS FEW GRAM NEGATIVE COCCOBACILLI    Culture NO GROWTH 2 DAYS  Final   Report Status 09/12/2017 FINAL  Final  Aerobic/Anaerobic Culture (surgical/deep wound)     Status: None (Preliminary result)   Collection Time: 09/09/17  4:26 PM  Result Value Ref Range Status   Specimen Description PLEURAL  Final   Special Requests PLEURAL EFFUSION EXDUTE PT ON ZINACEF VANC ZOCYIN  Final   Gram Stain   Final    FEW WBC PRESENT, PREDOMINANTLY PMN FEW GRAM POSITIVE COCCI IN PAIRS IN CHAINS    Culture NO GROWTH 5 DAYS  Final   Report Status PENDING  Incomplete     Labs: BNP (last 3 results) Recent Labs    08/13/17 1043  BNP 28.3   Basic Metabolic Panel: Recent Labs  Lab 09/10/17 0518 09/11/17 0427 09/12/17 0507 09/12/17 1648 09/13/17 0509 09/14/17 0312  NA 138 140 138  --  138 139  K 4.0 3.8 2.9*  --  3.3* 4.3  CL 104 107 105  --  107 108  CO2 24 26 25   --  26 23  GLUCOSE 186* 99 99  --  91 95  BUN 14 15 16   --  13 12  CREATININE 1.22 1.40* 1.36*  --  1.35* 1.43*  CALCIUM 8.1* 8.1* 8.0*  --  8.0* 8.3*  MG  --   --   --   1.7  --  2.0   Liver Function Tests: Recent Labs  Lab 09/09/17 0714 09/11/17 0427  AST 70* 38  ALT 70* 53  ALKPHOS 121 91  BILITOT 0.7 0.4  PROT 5.2* 5.1*  ALBUMIN 1.7* 1.7*   No results for input(s): LIPASE, AMYLASE in the last 168 hours. No results for input(s): AMMONIA in the last 168 hours. CBC: Recent Labs  Lab 09/09/17 2130 09/10/17 0518 09/11/17 0427 09/12/17 0507 09/14/17 0312  WBC 23.7* 22.2* 13.8* 11.2* 13.1*  HGB 9.2* 8.5* 7.8* 7.5* 7.6*  HCT 28.5* 26.4* 25.0* 23.9* 24.7*  MCV 93.8 92.6 94.0 93.4 94.6  PLT 406* 364 344 346 367   Cardiac Enzymes: No results for input(s): CKTOTAL, CKMB, CKMBINDEX, TROPONINI in the last 168 hours. BNP: Invalid input(s): POCBNP CBG: Recent Labs  Lab 09/08/17 0836  GLUCAP 100*   D-Dimer No results for input(s): DDIMER  in the last 72 hours. Hgb A1c No results for input(s): HGBA1C in the last 72 hours. Lipid Profile No results for input(s): CHOL, HDL, LDLCALC, TRIG, CHOLHDL, LDLDIRECT in the last 72 hours. Thyroid function studies No results for input(s): TSH, T4TOTAL, T3FREE, THYROIDAB in the last 72 hours.  Invalid input(s): FREET3 Anemia work up No results for input(s): VITAMINB12, FOLATE, FERRITIN, TIBC, IRON, RETICCTPCT in the last 72 hours. Urinalysis    Component Value Date/Time   COLORURINE STRAW (A) 09/10/2017 2122   APPEARANCEUR CLEAR 09/10/2017 2122   LABSPEC 1.005 09/10/2017 2122   PHURINE 5.0 09/10/2017 2122   GLUCOSEU NEGATIVE 09/10/2017 2122   HGBUR SMALL (A) 09/10/2017 2122   BILIRUBINUR NEGATIVE 09/10/2017 2122   KETONESUR NEGATIVE 09/10/2017 2122   PROTEINUR NEGATIVE 09/10/2017 2122   UROBILINOGEN 0.2 10/20/2010 1625   NITRITE NEGATIVE 09/10/2017 2122   LEUKOCYTESUR NEGATIVE 09/10/2017 2122   Sepsis Labs Invalid input(s): PROCALCITONIN,  WBC,  LACTICIDVEN Microbiology Recent Results (from the past 240 hour(s))  Culture, blood (x 2)     Status: None   Collection Time: 09/04/17 10:30 PM   Result Value Ref Range Status   Specimen Description BLOOD LEFT ARM  Final   Special Requests   Final    BOTTLES DRAWN AEROBIC AND ANAEROBIC Blood Culture adequate volume   Culture NO GROWTH 5 DAYS  Final   Report Status 09/10/2017 FINAL  Final  Culture, blood (x 2)     Status: None   Collection Time: 09/04/17 10:38 PM  Result Value Ref Range Status   Specimen Description BLOOD RIGHT ARM  Final   Special Requests IN PEDIATRIC BOTTLE Blood Culture adequate volume  Final   Culture NO GROWTH 5 DAYS  Final   Report Status 09/10/2017 FINAL  Final  MRSA PCR Screening     Status: None   Collection Time: 09/08/17 10:31 PM  Result Value Ref Range Status   MRSA by PCR NEGATIVE NEGATIVE Final    Comment:        The GeneXpert MRSA Assay (FDA approved for NASAL specimens only), is one component of a comprehensive MRSA colonization surveillance program. It is not intended to diagnose MRSA infection nor to guide or monitor treatment for MRSA infections.   Culture, respiratory (NON-Expectorated)     Status: None   Collection Time: 09/09/17  2:56 PM  Result Value Ref Range Status   Specimen Description BRONCHIAL ALVEOLAR LAVAGE  Final   Special Requests NONE  Final   Gram Stain   Final    RARE WBC PRESENT, PREDOMINANTLY MONONUCLEAR NO ORGANISMS SEEN    Culture NO GROWTH 2 DAYS  Final   Report Status 09/12/2017 FINAL  Final  Culture, fungus without smear     Status: None (Preliminary result)   Collection Time: 09/09/17  4:04 PM  Result Value Ref Range Status   Specimen Description BRONCHIAL WASHINGS  Final   Special Requests NONE  Final   Culture NO FUNGUS ISOLATED AFTER 3 DAYS  Final   Report Status PENDING  Incomplete  Acid Fast Smear (AFB)     Status: None   Collection Time: 09/09/17  4:04 PM  Result Value Ref Range Status   AFB Specimen Processing Concentration  Final   Acid Fast Smear Negative  Final    Comment: (NOTE) Performed At: [facility] [place], [place] [id number] Rush Farmer MD [id number]    Source (AFB) BRONCHIAL WASHINGS  Final  Anaerobic culture     Status:  None (Preliminary result)   Collection Time: 09/09/17  4:04 PM  Result Value Ref Range Status   Specimen Description BRONCHIAL WASHINGS  Final   Special Requests NONE  Final   Culture NO GROWTH 2 DAYS HOLDING FOR POSSIBLE ANAEROBE  Final   Report Status PENDING  Incomplete  Culture, respiratory (NON-Expectorated)     Status: None   Collection Time: 09/09/17  4:04 PM  Result Value Ref Range Status   Specimen Description BRONCHIAL WASHINGS  Final   Special Requests NONE  Final   Gram Stain   Final    MODERATE WBC PRESENT, PREDOMINANTLY PMN ABUNDANT GRAM POSITIVE COCCI IN PAIRS IN CHAINS FEW GRAM NEGATIVE COCCOBACILLI    Culture NO GROWTH 2 DAYS  Final   Report Status 09/12/2017 FINAL  Final  Aerobic/Anaerobic Culture (surgical/deep wound)     Status: None (Preliminary result)   Collection Time: 09/09/17  4:26 PM  Result Value Ref Range Status   Specimen Description PLEURAL  Final   Special Requests PLEURAL EFFUSION EXDUTE PT ON ZINACEF VANC ZOCYIN  Final   Gram Stain   Final    FEW WBC PRESENT, PREDOMINANTLY PMN FEW GRAM POSITIVE COCCI IN PAIRS IN CHAINS    Culture NO GROWTH 5 DAYS  Final   Report Status PENDING  Incomplete     Time coordinating discharge: Over 30 minutes  SIGNED:   Edwin Dada, MD  [facility] 09/14/2017, 9:57 PM   If 7PM-7AM, please contact night-coverage [email or web address] Password [email or web address]

## 2017-09-14 NOTE — Care Management (Signed)
[facility] referral called to [facility], [place].  [facility] to provide [facility] RN and PT with [place] within 1-2 days.

## 2017-09-14 NOTE — Progress Notes (Addendum)
      [address]       [place] [address]             [phone]      5 Days Post-Op Procedure(s) (LRB): VIDEO ASSISTED THORACOSCOPY (VATS)/DECORTICATION (Left) DRAINAGE OF PLEURAL EFFUSION (Left) VIDEO BRONCHOSCOPY   Subjective:  No new complaints.  Wants to go home.  Objective: Vital signs in last 24 hours: Temp:  [97.8 F (36.6 C)-98.4 F (36.9 C)] 98.4 F (36.9 C) (12/22 0723) Pulse Rate:  [94-102] 100 (12/22 0723) Cardiac Rhythm: Normal sinus rhythm (12/22 0723) Resp:  [16-21] 21 (12/22 0723) BP: (127-153)/(63-76) 150/71 (12/22 0723) SpO2:  [94 %-100 %] 97 % (12/22 0805)  Intake/Output from previous day: 12/21 0701 - 12/22 0700 In: -  Out: 750 [Urine:750] Intake/Output this shift: Total I/O In: 240 [P.O.:240] Out: 700 [Urine:700]  General appearance: alert, cooperative and no distress Heart: regular rate and rhythm Lungs: clear to auscultation bilaterally Abdomen: soft, non-tender; bowel sounds normal; no masses,  no organomegaly Extremities: extremities normal, atraumatic, no cyanosis or edema Wound: clean and dry  Lab Results: Recent Labs    09/12/17 0507 09/14/17 0312  WBC 11.2* 13.1*  HGB 7.5* 7.6*  HCT 23.9* 24.7*  PLT 346 367   BMET:  Recent Labs    09/13/17 0509 09/14/17 0312  NA 138 139  K 3.3* 4.3  CL 107 108  CO2 26 23  GLUCOSE 91 95  BUN 13 12  CREATININE 1.35* 1.43*  CALCIUM 8.0* 8.3*    PT/INR: No results for input(s): LABPROT, INR in the last 72 hours. ABG    Component Value Date/Time   PHART 7.418 09/10/2017 0533   HCO3 23.5 09/10/2017 0533   TCO2 25 09/10/2017 0533   ACIDBASEDEF 1.0 09/10/2017 0533   O2SAT 97.0 09/10/2017 0533   CBG (last 3)  No results for input(s): GLUCAP in the last 72 hours.  Assessment/Plan: S/P Procedure(s) (LRB): VIDEO ASSISTED THORACOSCOPY (VATS)/DECORTICATION (Left) DRAINAGE OF PLEURAL EFFUSION (Left) VIDEO BRONCHOSCOPY  1. Chest tubes removed yesterday- follow up  CXR is stable with no pneumothorax, some improvement in atelectasis 2. Dispo- okay to d/c from our standpoint, continue Levaquin for 6 weeks total therapy,  follow up appointment has been placed in chart   LOS: 10 days    Ellwood Handler 09/14/2017 Patient seen and examined agree with above Will need to see him in the office in about 3 weeks  Remo Lipps C. Roxan Hockey, MD [facility] [phone]

## 2017-09-14 NOTE — Progress Notes (Signed)
Reviewed discharge instructions with pt and spouse including new medications, incision care and signs and symptoms of infection. Also reviewed follow up appts. Verified with Dr Loleta Books and [place] that antibiotic is for 5 weeks. Pt discharged in wheelchair.

## 2017-09-15 LAB — ANAEROBIC CULTURE

## 2017-09-15 LAB — AEROBIC/ANAEROBIC CULTURE W GRAM STAIN (SURGICAL/DEEP WOUND): Culture: NO GROWTH

## 2017-09-19 ENCOUNTER — Other Ambulatory Visit: Payer: Self-pay | Admitting: *Deleted

## 2017-09-20 ENCOUNTER — Emergency Department (HOSPITAL_COMMUNITY): Payer: Medicare Other

## 2017-09-20 ENCOUNTER — Observation Stay (HOSPITAL_COMMUNITY)
Admission: EM | Admit: 2017-09-20 | Discharge: 2017-09-23 | Disposition: A | Discharge status: Home / Self Care | Payer: Medicare Other | Attending: Internal Medicine | Admitting: Internal Medicine

## 2017-09-20 ENCOUNTER — Encounter (HOSPITAL_COMMUNITY): Payer: Self-pay | Admitting: Emergency Medicine

## 2017-09-20 ENCOUNTER — Other Ambulatory Visit: Payer: Self-pay

## 2017-09-20 DIAGNOSIS — J869 Pyothorax without fistula: Secondary | ICD-10-CM | POA: Diagnosis not present

## 2017-09-20 DIAGNOSIS — M1990 Unspecified osteoarthritis, unspecified site: Secondary | ICD-10-CM | POA: Diagnosis not present

## 2017-09-20 DIAGNOSIS — K922 Gastrointestinal hemorrhage, unspecified: Secondary | ICD-10-CM

## 2017-09-20 DIAGNOSIS — D649 Anemia, unspecified: Secondary | ICD-10-CM

## 2017-09-20 DIAGNOSIS — R0602 Shortness of breath: Secondary | ICD-10-CM | POA: Insufficient documentation

## 2017-09-20 DIAGNOSIS — E43 Unspecified severe protein-calorie malnutrition: Secondary | ICD-10-CM | POA: Diagnosis not present

## 2017-09-20 DIAGNOSIS — Z800 Family history of malignant neoplasm of digestive organs: Secondary | ICD-10-CM | POA: Diagnosis not present

## 2017-09-20 DIAGNOSIS — R350 Frequency of micturition: Secondary | ICD-10-CM | POA: Insufficient documentation

## 2017-09-20 DIAGNOSIS — N183 Chronic kidney disease, stage 3 (moderate): Secondary | ICD-10-CM | POA: Insufficient documentation

## 2017-09-20 DIAGNOSIS — Z79899 Other long term (current) drug therapy: Secondary | ICD-10-CM | POA: Diagnosis not present

## 2017-09-20 DIAGNOSIS — Z79891 Long term (current) use of opiate analgesic: Secondary | ICD-10-CM | POA: Insufficient documentation

## 2017-09-20 DIAGNOSIS — I129 Hypertensive chronic kidney disease with stage 1 through stage 4 chronic kidney disease, or unspecified chronic kidney disease: Secondary | ICD-10-CM | POA: Diagnosis not present

## 2017-09-20 DIAGNOSIS — N401 Enlarged prostate with lower urinary tract symptoms: Secondary | ICD-10-CM | POA: Insufficient documentation

## 2017-09-20 DIAGNOSIS — D500 Iron deficiency anemia secondary to blood loss (chronic): Secondary | ICD-10-CM | POA: Diagnosis not present

## 2017-09-20 DIAGNOSIS — Z7982 Long term (current) use of aspirin: Secondary | ICD-10-CM | POA: Diagnosis not present

## 2017-09-20 DIAGNOSIS — Z87891 Personal history of nicotine dependence: Secondary | ICD-10-CM | POA: Insufficient documentation

## 2017-09-20 DIAGNOSIS — I447 Left bundle-branch block, unspecified: Secondary | ICD-10-CM | POA: Insufficient documentation

## 2017-09-20 DIAGNOSIS — J90 Pleural effusion, not elsewhere classified: Secondary | ICD-10-CM | POA: Diagnosis not present

## 2017-09-20 DIAGNOSIS — R195 Other fecal abnormalities: Secondary | ICD-10-CM | POA: Insufficient documentation

## 2017-09-20 DIAGNOSIS — E785 Hyperlipidemia, unspecified: Secondary | ICD-10-CM | POA: Insufficient documentation

## 2017-09-20 DIAGNOSIS — I10 Essential (primary) hypertension: Secondary | ICD-10-CM | POA: Diagnosis present

## 2017-09-20 DIAGNOSIS — G4733 Obstructive sleep apnea (adult) (pediatric): Secondary | ICD-10-CM | POA: Insufficient documentation

## 2017-09-20 DIAGNOSIS — G8929 Other chronic pain: Secondary | ICD-10-CM | POA: Insufficient documentation

## 2017-09-20 DIAGNOSIS — R011 Cardiac murmur, unspecified: Secondary | ICD-10-CM | POA: Insufficient documentation

## 2017-09-20 DIAGNOSIS — Z87442 Personal history of urinary calculi: Secondary | ICD-10-CM | POA: Insufficient documentation

## 2017-09-20 DIAGNOSIS — Z888 Allergy status to other drugs, medicaments and biological substances status: Secondary | ICD-10-CM | POA: Diagnosis not present

## 2017-09-20 HISTORY — DX: Anemia, unspecified: D64.9

## 2017-09-20 LAB — URINALYSIS, ROUTINE W REFLEX MICROSCOPIC
Bilirubin Urine: NEGATIVE
Glucose, UA: NEGATIVE mg/dL
Hgb urine dipstick: NEGATIVE
Ketones, ur: NEGATIVE mg/dL
Leukocytes, UA: NEGATIVE
Nitrite: NEGATIVE
Protein, ur: NEGATIVE mg/dL
Specific Gravity, Urine: 1.009 (ref 1.005–1.030)
pH: 7 (ref 5.0–8.0)

## 2017-09-20 LAB — CBC
HCT: 26.8 % — ABNORMAL LOW (ref 39.0–52.0)
Hemoglobin: 8.1 g/dL — ABNORMAL LOW (ref 13.0–17.0)
MCH: 28 pg (ref 26.0–34.0)
MCHC: 30.2 g/dL (ref 30.0–36.0)
MCV: 92.7 fL (ref 78.0–100.0)
Platelets: 382 10*3/uL (ref 150–400)
RBC: 2.89 MIL/uL — ABNORMAL LOW (ref 4.22–5.81)
RDW: 15.9 % — ABNORMAL HIGH (ref 11.5–15.5)
WBC: 14.1 10*3/uL — ABNORMAL HIGH (ref 4.0–10.5)

## 2017-09-20 LAB — BASIC METABOLIC PANEL
Anion gap: 11 (ref 5–15)
BUN: 17 mg/dL (ref 6–20)
CO2: 26 mmol/L (ref 22–32)
Calcium: 8.8 mg/dL — ABNORMAL LOW (ref 8.9–10.3)
Chloride: 101 mmol/L (ref 101–111)
Creatinine, Ser: 1.5 mg/dL — ABNORMAL HIGH (ref 0.61–1.24)
GFR calc Af Amer: 48 mL/min — ABNORMAL LOW (ref >60)
GFR calc non Af Amer: 42 mL/min — ABNORMAL LOW (ref >60)
Glucose, Bld: 132 mg/dL — ABNORMAL HIGH (ref 65–99)
Potassium: 4.6 mmol/L (ref 3.5–5.1)
Sodium: 138 mmol/L (ref 135–145)

## 2017-09-20 LAB — POC OCCULT BLOOD, ED: Fecal Occult Bld: POSITIVE — AB

## 2017-09-20 LAB — CBG MONITORING, ED: Glucose-Capillary: 97 mg/dL (ref 65–99)

## 2017-09-20 MED ORDER — SODIUM CHLORIDE 0.9 % IV BOLUS (SEPSIS)
1000.0000 mL | Freq: Once | INTRAVENOUS | Status: AC
  Administered 2017-09-20: 1000 mL via INTRAVENOUS

## 2017-09-20 MED ORDER — ENSURE ENLIVE PO LIQD
237.0000 mL | Freq: Two times a day (BID) | ORAL | Status: DC
  Administered 2017-09-21 – 2017-09-23 (×3): 237 mL via ORAL

## 2017-09-20 NOTE — ED Notes (Signed)
Pt went to doctor today for checkup and to have sutures removed from empyema surgery to his left lung for which he was discharged on Saturday.  Pt had complaints of extreme weakness and physician did hemoccult which was positive as well as a "borderline hemoglobin".  Pt has complaints of chronic back pain, post surgical pain to left chest incisions, and weakness.

## 2017-09-20 NOTE — ED Provider Notes (Signed)
[facility] PROGRESSIVE CARE Provider Note   CSN: [id number] Arrival date & time: 09/20/17  1330     History   Chief Complaint Chief Complaint  Patient presents with  . Weakness  . Rectal Bleeding    HPI Aaron Schmidt is a 83 y.o. male.  Aaron Schmidt is a 83 y.o. Male who presents to the ED complaining of fatigue and lightheadedness today.  Patient was recently hospitalized for an empyema of his lung on December 17 and had surgery by Dr. Roxan Hockey.  He was discharged with Augmentin.  He was seen by PCP today with his fatigue and weakness.  He was found to be orthostatic and had guaiac positive stool.  He was sent to the emergency department for further evaluation.  Patient tells me he has been feeling more fatigued every day since discharge from the hospital on December 22.  He has not seen any blood in his stools.  He was previously taking Advil daily, but this has been discontinued for several weeks now.  He tells me he has been feeling lightheaded with position change today. He reports urinary frequency for several days.  He denies of fevers, history of abdominal surgeries, history of GI bleeding, abdominal pain, melena, hematochezia, vomiting, nausea, hematemesis, chest pain, coughing, shortness of breath or rashes.   The history is provided by the patient and medical records. No language interpreter was used.  Weakness  Pertinent negatives include no shortness of breath, no chest pain, no vomiting and no headaches.  Rectal Bleeding  Associated symptoms: light-headedness   Associated symptoms: no abdominal pain, no fever and no vomiting     Past Medical History:  Diagnosis Date  . Arthritis    Degeneration spine & stenosis  . History of kidney stones   . Hypertension    has a histroy of  . Hypotension   . Sleep apnea 2012   used CPAP 2 yrs. ago, feels he sleeps better w/o, no longer using     Patient Active Problem List   Diagnosis Date Noted  . Anemia  09/21/2017  . Symptomatic anemia 09/20/2017  . Protein-calorie malnutrition, severe 09/11/2017  . Empyema lung ([place]) 09/09/2017  . SIRS (systemic inflammatory response syndrome) ([place]) 09/05/2017  . Elevated troponin 09/05/2017  . Normochromic normocytic anemia 09/05/2017  . Hyponatremia 09/05/2017  . Acute kidney injury superimposed on chronic kidney disease ([place]) 09/05/2017  . Chronic pain 09/05/2017  . Pleural effusion, left 09/04/2017  . Pleural effusion on left 08/19/2017  . Sinusitis, chronic 08/19/2017  . Murmur 01/08/2017  . Skin ulcer of toe of left foot, limited to breakdown of skin ([place]) 09/04/2016  . Cervical myelopathy with cervical radiculopathy 06/24/2014  . LBBB (left bundle branch block) 06/21/2014  . Hyperlipidemia 06/21/2014  . Preoperative cardiovascular examination 06/21/2014  . OBSTRUCTIVE SLEEP APNEA 09/30/2010  . Essential hypertension 08/29/2010    Past Surgical History:  Procedure Laterality Date  . ARM NEUROPLASTY     at 14 yrs. of age- fell off tractor- had fracture & repair *& later- 80's had  transplantation of a nerve at the elbow  . BACK SURGERY     x4 back surgery x2 fusion -  . CARPAL TUNNEL RELEASE Right   . COLONOSCOPY    . PLEURAL EFFUSION DRAINAGE Left 09/09/2017   Procedure: DRAINAGE OF PLEURAL EFFUSION;  Surgeon: [name], MD;  Location: [facility];  Service: Thoracic;  Laterality: Left;  . POSTERIOR CERVICAL FUSION/FORAMINOTOMY N/A 06/24/2014  Procedure: Posterior Cervical Three-Seven Fusion with Lateral Mass Fixation;  Surgeon: Erline Levine, MD;  Location: [facility];  Service: Neurosurgery;  Laterality: N/A;  C3-C7 posterior cervical fusion with lateral mass fixation  . SHOULDER SURGERY Bilateral   . VIDEO ASSISTED THORACOSCOPY (VATS)/DECORTICATION Left 09/09/2017   Procedure: VIDEO ASSISTED THORACOSCOPY (VATS)/DECORTICATION;  Surgeon: [name], MD;  Location: [facility];  Service: Thoracic;  Laterality: Left;  Marland Kitchen  VIDEO BRONCHOSCOPY  09/09/2017   Procedure: VIDEO BRONCHOSCOPY;  Surgeon: [name], MD;  Location: [facility];  Service: Thoracic;;       Home Medications    Prior to Admission medications   Medication Sig Start Date End Date Taking? Authorizing Provider  amoxicillin-clavulanate (AUGMENTIN) 875-125 MG tablet Take 1 tablet by mouth every 12 (twelve) hours. Patient taking differently: Take 1 tablet by mouth every 12 (twelve) hours. 5 week course (70 doses) started 09/15/17 09/14/17 10/19/17 Yes Danford, Suann Larry, MD  aspirin EC 81 MG tablet Take 81 mg by mouth daily.   Yes [provider]  b complex vitamins tablet Take 1 tablet by mouth daily.     Yes [provider]  Cholecalciferol (VITAMIN D) 2000 UNITS CAPS Take 2,000 Units by mouth daily.    Yes [provider]  docusate sodium (COLACE) 100 MG capsule Take 300 mg by mouth daily.    Yes [provider]  finasteride (PROSCAR) 5 MG tablet Take 5 mg by mouth daily.   Yes [provider]  Flaxseed, Linseed, (FLAX SEED OIL) 1000 MG CAPS Take 1,000 mg by mouth daily.    Yes [provider]  fluocinonide cream (LIDEX) 7.03 % Apply 1 application topically daily as needed (itching).   Yes [provider]  HYDROmorphone (DILAUDID) 2 MG tablet Take 2 mg by mouth 4 (four) times daily as needed for severe pain.   Yes [provider]  L-Lysine 500 MG CAPS Take 500 mg by mouth daily.    Yes [provider]  Multiple Vitamin (MULTIVITAMIN WITH MINERALS) TABS tablet Take 1 tablet by mouth daily.   Yes [provider]  nortriptyline (PAMELOR) 25 MG capsule Take 25 mg by mouth at bedtime.    Yes [provider]  pregabalin (LYRICA) 150 MG capsule Take 150 mg by mouth 2 (two) times daily.   Yes [provider]  Probiotic Product (PROBIOTIC PO) Take 1 tablet by mouth 2 (two) times daily.   Yes [provider]  pyridOXINE (VITAMIN  B-6) 100 MG tablet Take 100 mg by mouth daily.     Yes [provider]  saw palmetto 80 MG capsule Take 240 mg by mouth daily.    Yes [provider]  Senna-Psyllium (PERDIEM PO) Take 3 tablets by mouth 2 (two) times daily as needed (constipation).    Yes [provider]  simvastatin (ZOCOR) 40 MG tablet Take 40 mg by mouth at bedtime.    Yes [provider]  tiZANidine (ZANAFLEX) 2 MG tablet Take 2 mg by mouth 3 (three) times daily.    Yes [provider]  triamterene-hydrochlorothiazide (MAXZIDE-25) 37.5-25 MG per tablet Take 1-2 tablets by mouth See admin instructions. Take 1 tablet by mouth daily, increase to 2 tablets as needed for ankle swelling   Yes [provider]  vitamin C (ASCORBIC ACID) 500 MG tablet Take 500 mg by mouth 2 (two) times daily.   Yes [provider]  mineral oil liquid Take 60 mLs by mouth daily  at 3 pm.    [provider]    Family History Family History  Problem Relation Age of Onset  . Pancreatic cancer Mother     Social History Social History   Tobacco Use  . Smoking status: Former Smoker    Years: 24.00    Types: Pipe    Last attempt to quit: 09/24/1978    Years since quitting: 39.0  . Smokeless tobacco: Never Used  Substance Use Topics  . Alcohol use: Yes    Comment: 3 drinks/day - gin or vodka   . Drug use: No     Allergies   Diazepam   Review of Systems Review of Systems  Constitutional: Positive for appetite change and fatigue. Negative for chills and fever.  HENT: Negative for congestion and sore throat.   Eyes: Negative for visual disturbance.  Respiratory: Negative for cough, shortness of breath and wheezing.   Cardiovascular: Negative for chest pain and palpitations.  Gastrointestinal: Positive for hematochezia. Negative for abdominal pain, diarrhea, nausea and vomiting.  Genitourinary: Positive for frequency. Negative for difficulty urinating and dysuria.    Musculoskeletal: Negative for back pain and neck pain.  Skin: Negative for rash.  Neurological: Positive for weakness and light-headedness. Negative for headaches.     Physical Exam Updated Vital Signs BP (!) 102/56 (BP Location: Left Arm)   Pulse 86   Temp 98 F (36.7 C) (Oral)   Resp 18   Ht 5\' 10"  (1.778 m)   Wt 79.7 kg (175 lb 11.3 oz)   SpO2 99%   BMI 25.21 kg/m   Physical Exam  Constitutional: He appears well-developed and well-nourished. No distress.  Nontoxic-appearing.  HENT:  Head: Normocephalic and atraumatic.  Mouth/Throat: Oropharynx is clear and moist.  Eyes: Conjunctivae are normal. Pupils are equal, round, and reactive to light. Right eye exhibits no discharge. Left eye exhibits no discharge.  Neck: Neck supple.  Cardiovascular: Normal rate, regular rhythm, normal heart sounds and intact distal pulses. Exam reveals no gallop and no friction rub.  No murmur heard. Pulmonary/Chest: Effort normal. No respiratory distress. He has no wheezes. He has no rales.  Diminished lung sounds to his left base.  Symmetric chest expansion bilaterally.  No increased work of breathing.  Well-healing incisions noted to his left chest wall.  No discharge or erythema.  Abdominal: Soft. He exhibits no distension. There is no tenderness. There is no guarding.  Genitourinary: Rectal exam shows guaiac positive stool.  Genitourinary Comments: Brown stool on exam. Guaiac positive.   Musculoskeletal: He exhibits no edema.  Lymphadenopathy:    He has no cervical adenopathy.  Neurological: He is alert. Coordination normal.  Skin: Skin is warm and dry. No rash noted. He is not diaphoretic. No erythema. No pallor.  Psychiatric: He has a normal mood and affect. His behavior is normal.  Nursing note and vitals reviewed.    ED Treatments / Results  Labs (all labs ordered are listed, but only abnormal results are displayed) Labs Reviewed  BASIC METABOLIC PANEL - Abnormal; Notable for the  following components:      Result Value   Glucose, Bld 132 (*)    Creatinine, Ser 1.50 (*)    Calcium 8.8 (*)    GFR calc non Af Amer 42 (*)    GFR calc Af Amer 48 (*)    All other components within normal limits  CBC - Abnormal; Notable for the following components:   WBC 14.1 (*)    RBC 2.89 (*)  Hemoglobin 8.1 (*)    HCT 26.8 (*)    RDW 15.9 (*)    All other components within normal limits  POC OCCULT BLOOD, ED - Abnormal; Notable for the following components:   Fecal Occult Bld POSITIVE (*)    All other components within normal limits  URINALYSIS, ROUTINE W REFLEX MICROSCOPIC  BASIC METABOLIC PANEL  CBC  TSH  TROPONIN I  VITAMIN B12  FOLATE  IRON AND TIBC  FERRITIN  RETICULOCYTES  CBG MONITORING, ED  TYPE AND SCREEN  PREPARE RBC (CROSSMATCH)    EKG  EKG Interpretation  Date/Time:  Friday September 20 2017 13:36:33 EST Ventricular Rate:  93 PR Interval:  174 QRS Duration: 120 QT Interval:  390 QTC Calculation: 484 R Axis:   -23 Text Interpretation:  Normal sinus rhythm Possible Left atrial enlargement Non-specific intra-ventricular conduction delay Borderline ECG Confirmed by Pattricia Boss [phone]) on 09/20/2017 1:45:37 PM       Radiology Dg Chest 2 View  Result Date: 09/20/2017 CLINICAL DATA:  Short of breath EXAM: CHEST  2 VIEW COMPARISON:  Chest x-ray 09/20/2017, chest CT 08/13/2017 FINDINGS: Loculated left pleural fluid unchanged from prior studies. Left lower lobe airspace disease also unchanged, likely atelectasis based on the prior CT. Pneumonia not excluded Right lung clear.  Negative for heart failure IMPRESSION: No interval change in loculated left effusion and left lower lobe airspace disease. Electronically Signed   By: Franchot Gallo M.D.   On: 09/20/2017 19:47    Procedures Procedures (including critical care time)  Medications Ordered in ED Medications  feeding supplement (ENSURE ENLIVE) (ENSURE ENLIVE) liquid 237 mL (not administered)    amoxicillin-clavulanate (AUGMENTIN) 875-125 MG per tablet 1 tablet (1 tablet Oral Given 09/21/17 0101)  docusate sodium (COLACE) capsule 300 mg (not administered)  finasteride (PROSCAR) tablet 5 mg (not administered)  HYDROmorphone (DILAUDID) tablet 2 mg (not administered)  pregabalin (LYRICA) capsule 150 mg (150 mg Oral Given 09/21/17 0102)  pyridOXINE (VITAMIN B-6) tablet 100 mg (not administered)  simvastatin (ZOCOR) tablet 40 mg (40 mg Oral Given 09/21/17 0103)  tiZANidine (ZANAFLEX) tablet 2 mg (2 mg Oral Given 09/21/17 0103)  triamterene-hydrochlorothiazide (MAXZIDE-25) 37.5-25 MG per tablet 1 tablet (not administered)  0.9 %  sodium chloride infusion (not administered)  acetaminophen (TYLENOL) tablet 650 mg (not administered)    Or  acetaminophen (TYLENOL) suppository 650 mg (not administered)  ondansetron (ZOFRAN) tablet 4 mg (not administered)    Or  ondansetron (ZOFRAN) injection 4 mg (not administered)  pantoprazole (PROTONIX) injection 40 mg (40 mg Intravenous Given 09/21/17 0102)  feeding supplement (BOOST / RESOURCE BREEZE) liquid 1 Container (not administered)  sodium chloride 0.9 % bolus 1,000 mL (0 mLs Intravenous Stopped 09/20/17 2247)     Initial Impression / Assessment and Plan / ED Course  I have reviewed the triage vital signs and the nursing notes.  Pertinent labs & imaging results that were available during my care of the patient were reviewed by me and considered in my medical decision making (see chart for details).     This is a 83 y.o. Male who presents to the ED complaining of fatigue and lightheadedness today.  Patient was recently hospitalized for an empyema of his lung on December 17 and had surgery by Dr. Roxan Hockey.  He was discharged with Augmentin.  He was seen by PCP today with his fatigue and weakness.  He was found to be orthostatic and had guaiac positive stool.  He was sent to the emergency department  for further evaluation.  Patient tells me he  has been feeling more fatigued every day since discharge from the hospital on December 22.  He has not seen any blood in his stools.  He was previously taking Advil daily, but this has been discontinued for several weeks now.  He tells me he has been feeling lightheaded with position change today. On exam the patient is afebrile nontoxic-appearing.  He has diminished lung sounds to his left base.  No increased work of breathing.  No hypoxia or tachypnea.  No melena on rectal exam.  Guaiac positive. Chest x-ray shows no interval change in loculated left effusion and left lower lobe airspace disease.  This is in comparison to his chest x-ray prior to discharge recently. Here hemoglobin is 8.1.  Patient's baseline is around 13 earlier this year.  He does have recent hemoglobins that are around this range however patient is symptomatic and this is a significant decrease from his baseline of around 13.  Will plan for admission for workup for GI bleeding.  Patient agrees with plan for admission.  Will hold on blood products at this time and follow closely with vitals and hemoglobin. I consulted Dr. Hal Hope who accepted the patient for admission.  This patient was discussed with and evaluated by Dr. Ellender Hose who agrees with assessment and plan.   Final Clinical Impressions(s) / ED Diagnoses   Final diagnoses:  Symptomatic anemia  Gastrointestinal hemorrhage, unspecified gastrointestinal hemorrhage type    ED Discharge Orders    None       Waynetta Pean, PA-C 09/21/17 0155    Duffy Bruce, MD 09/21/17 1152

## 2017-09-20 NOTE — ED Triage Notes (Signed)
Pt's ankles are swollen,

## 2017-09-20 NOTE — ED Triage Notes (Signed)
Some blood work sent by Dr.

## 2017-09-20 NOTE — ED Notes (Signed)
Pt assisted in standing for orthostatic VS. Pt is unsteady on feet. Uses chair for assistance. Did not feel light-headed or dizzy during.

## 2017-09-20 NOTE — ED Triage Notes (Signed)
Wife/pt stated, hes been weak went to Dr. Today and was sent here for further follow up. Pt. Stated,l he is weak and I have some blood in his stool

## 2017-09-21 ENCOUNTER — Encounter (HOSPITAL_COMMUNITY): Payer: Self-pay | Admitting: Internal Medicine

## 2017-09-21 DIAGNOSIS — D649 Anemia, unspecified: Secondary | ICD-10-CM

## 2017-09-21 DIAGNOSIS — K922 Gastrointestinal hemorrhage, unspecified: Secondary | ICD-10-CM

## 2017-09-21 DIAGNOSIS — I10 Essential (primary) hypertension: Secondary | ICD-10-CM

## 2017-09-21 HISTORY — DX: Anemia, unspecified: D64.9

## 2017-09-21 LAB — BASIC METABOLIC PANEL
Anion gap: 12 (ref 5–15)
BUN: 18 mg/dL (ref 6–20)
CO2: 22 mmol/L (ref 22–32)
Calcium: 8.3 mg/dL — ABNORMAL LOW (ref 8.9–10.3)
Chloride: 102 mmol/L (ref 101–111)
Creatinine, Ser: 1.42 mg/dL — ABNORMAL HIGH (ref 0.61–1.24)
GFR calc Af Amer: 52 mL/min — ABNORMAL LOW (ref >60)
GFR calc non Af Amer: 44 mL/min — ABNORMAL LOW (ref >60)
Glucose, Bld: 113 mg/dL — ABNORMAL HIGH (ref 65–99)
Potassium: 3.7 mmol/L (ref 3.5–5.1)
Sodium: 136 mmol/L (ref 135–145)

## 2017-09-21 LAB — TSH: TSH: 0.731 u[IU]/mL (ref 0.350–4.500)

## 2017-09-21 LAB — TROPONIN I: Troponin I: <0.03 ng/mL (ref <0.03)

## 2017-09-21 LAB — RETICULOCYTES
RBC.: 2.5 MIL/uL — ABNORMAL LOW (ref 4.22–5.81)
Retic Count, Absolute: 85 10*3/uL (ref 19.0–186.0)
Retic Ct Pct: 3.4 % — ABNORMAL HIGH (ref 0.4–3.1)

## 2017-09-21 LAB — IRON AND TIBC
Iron: 18 ug/dL — ABNORMAL LOW (ref 45–182)
Saturation Ratios: 9 % — ABNORMAL LOW (ref 17.9–39.5)
TIBC: 199 ug/dL — ABNORMAL LOW (ref 250–450)
UIBC: 181 ug/dL

## 2017-09-21 LAB — CBC
HCT: 23 % — ABNORMAL LOW (ref 39.0–52.0)
HCT: 27.5 % — ABNORMAL LOW (ref 39.0–52.0)
Hemoglobin: 7.3 g/dL — ABNORMAL LOW (ref 13.0–17.0)
Hemoglobin: 8.6 g/dL — ABNORMAL LOW (ref 13.0–17.0)
MCH: 29.2 pg (ref 26.0–34.0)
MCH: 28.7 pg (ref 26.0–34.0)
MCHC: 31.7 g/dL (ref 30.0–36.0)
MCHC: 31.3 g/dL (ref 30.0–36.0)
MCV: 92 fL (ref 78.0–100.0)
MCV: 91.7 fL (ref 78.0–100.0)
Platelets: 352 10*3/uL (ref 150–400)
Platelets: 359 10*3/uL (ref 150–400)
RBC: 2.5 MIL/uL — ABNORMAL LOW (ref 4.22–5.81)
RBC: 3 MIL/uL — ABNORMAL LOW (ref 4.22–5.81)
RDW: 15.9 % — ABNORMAL HIGH (ref 11.5–15.5)
RDW: 15.5 % (ref 11.5–15.5)
WBC: 10 10*3/uL (ref 4.0–10.5)
WBC: 12.2 10*3/uL — ABNORMAL HIGH (ref 4.0–10.5)

## 2017-09-21 LAB — VITAMIN B12: Vitamin B-12: 1178 pg/mL — ABNORMAL HIGH (ref 180–914)

## 2017-09-21 LAB — FERRITIN: Ferritin: 425 ng/mL — ABNORMAL HIGH (ref 24–336)

## 2017-09-21 LAB — FOLATE: Folate: 17.6 ng/mL (ref >5.9)

## 2017-09-21 LAB — PREPARE RBC (CROSSMATCH)

## 2017-09-21 MED ORDER — PANTOPRAZOLE SODIUM 40 MG PO TBEC: 40.0000 mg | DELAYED_RELEASE_TABLET | Freq: Two times a day (BID) | ORAL | Status: DC

## 2017-09-21 MED ORDER — HYDROMORPHONE HCL 2 MG PO TABS
2.0000 mg | ORAL_TABLET | Freq: Four times a day (QID) | ORAL | Status: DC | PRN
  Administered 2017-09-21 – 2017-09-22 (×5): 2 mg via ORAL
  Filled 2017-09-21 (×5): qty 1

## 2017-09-21 MED ORDER — PANTOPRAZOLE SODIUM 40 MG IV SOLR
40.0000 mg | Freq: Two times a day (BID) | INTRAVENOUS | Status: DC
  Administered 2017-09-21 (×2): 40 mg via INTRAVENOUS
  Filled 2017-09-21 (×3): qty 40

## 2017-09-21 MED ORDER — L-LYSINE 500 MG PO CAPS: 500.0000 mg | ORAL_CAPSULE | Freq: Every day | ORAL | Status: DC

## 2017-09-21 MED ORDER — SODIUM CHLORIDE 0.9 % IV SOLN
Freq: Once | INTRAVENOUS | Status: AC
  Administered 2017-09-21: 02:00:00 via INTRAVENOUS

## 2017-09-21 MED ORDER — BOOST / RESOURCE BREEZE PO LIQD CUSTOM
1.0000 | Freq: Three times a day (TID) | ORAL | Status: DC
  Administered 2017-09-21: 1 via ORAL

## 2017-09-21 MED ORDER — FINASTERIDE 5 MG PO TABS
5.0000 mg | ORAL_TABLET | Freq: Every day | ORAL | Status: DC
  Administered 2017-09-21 – 2017-09-23 (×3): 5 mg via ORAL
  Filled 2017-09-21 (×3): qty 1

## 2017-09-21 MED ORDER — DOCUSATE SODIUM 100 MG PO CAPS
300.0000 mg | ORAL_CAPSULE | Freq: Every day | ORAL | Status: DC
  Administered 2017-09-21 – 2017-09-23 (×3): 300 mg via ORAL
  Filled 2017-09-21 (×3): qty 3

## 2017-09-21 MED ORDER — ONDANSETRON HCL 4 MG/2ML IJ SOLN: 4.0000 mg | Freq: Four times a day (QID) | INTRAMUSCULAR | Status: DC | PRN

## 2017-09-21 MED ORDER — PREGABALIN 75 MG PO CAPS
150.0000 mg | ORAL_CAPSULE | Freq: Two times a day (BID) | ORAL | Status: DC
  Administered 2017-09-21 – 2017-09-23 (×6): 150 mg via ORAL
  Filled 2017-09-21 (×6): qty 2

## 2017-09-21 MED ORDER — AMOXICILLIN-POT CLAVULANATE 875-125 MG PO TABS
1.0000 | ORAL_TABLET | Freq: Two times a day (BID) | ORAL | Status: DC
  Administered 2017-09-21 – 2017-09-23 (×6): 1 via ORAL
  Filled 2017-09-21 (×6): qty 1

## 2017-09-21 MED ORDER — ACETAMINOPHEN 325 MG PO TABS: 650.0000 mg | ORAL_TABLET | Freq: Four times a day (QID) | ORAL | Status: DC | PRN

## 2017-09-21 MED ORDER — VITAMIN B-6 100 MG PO TABS
100.0000 mg | ORAL_TABLET | Freq: Every day | ORAL | Status: DC
  Administered 2017-09-21 – 2017-09-23 (×3): 100 mg via ORAL
  Filled 2017-09-21 (×3): qty 1

## 2017-09-21 MED ORDER — ONDANSETRON HCL 4 MG PO TABS: 4.0000 mg | ORAL_TABLET | Freq: Four times a day (QID) | ORAL | Status: DC | PRN

## 2017-09-21 MED ORDER — PANTOPRAZOLE SODIUM 40 MG IV SOLR
40.0000 mg | Freq: Two times a day (BID) | INTRAVENOUS | Status: DC
  Administered 2017-09-21 – 2017-09-23 (×4): 40 mg via INTRAVENOUS
  Filled 2017-09-21 (×3): qty 40

## 2017-09-21 MED ORDER — TIZANIDINE HCL 4 MG PO TABS
2.0000 mg | ORAL_TABLET | Freq: Three times a day (TID) | ORAL | Status: DC
  Administered 2017-09-21 – 2017-09-23 (×8): 2 mg via ORAL
  Filled 2017-09-21 (×8): qty 1

## 2017-09-21 MED ORDER — SIMVASTATIN 40 MG PO TABS
40.0000 mg | ORAL_TABLET | Freq: Every day | ORAL | Status: DC
  Administered 2017-09-21 – 2017-09-22 (×3): 40 mg via ORAL
  Filled 2017-09-21 (×3): qty 1

## 2017-09-21 MED ORDER — ACETAMINOPHEN 650 MG RE SUPP: 650.0000 mg | Freq: Four times a day (QID) | RECTAL | Status: DC | PRN

## 2017-09-21 MED ORDER — TRIAMTERENE-HCTZ 37.5-25 MG PO TABS
1.0000 | ORAL_TABLET | Freq: Every day | ORAL | Status: DC
  Administered 2017-09-21 – 2017-09-23 (×3): 1 via ORAL
  Filled 2017-09-21 (×3): qty 1

## 2017-09-21 NOTE — H&P (View-Only) (Signed)
Referring Provider: Dr. Cruzita Lederer Primary Care Physician:  Hulan Fess, MD Primary Gastroenterologist:  Dr. Paulita Fujita  Reason for Consultation:  Anemia; Heme positive stool  HPI: Aaron Schmidt is a 83 y.o. male who is s/p VATS for an empyema last week and sent home on Augmentin who was readmitted yesterday for severe weakness. Hgb 8.1 yesterday (7.6 on 09/14/17) and 7.3 last night. Denies melena, hematochezia or abdominal pain. He had been taking high doses of Advil for a week recently stating he was taking the Advil (200 mg each) 6 pills QID for 7 days. EGD in 2013 showed gastritis and colonoscopy in 2013 where several tubular adenomas were removed and it also showed diverticulosis (Dr. Paulita Fujita). Given one unit PRBCs last night. Feels better than he did at admit and sitting in bedside chair. Wife at bedside.   Past Medical History:  Diagnosis Date  . Arthritis    Degeneration spine & stenosis  . History of kidney stones   . Hypertension    has a histroy of  . Hypotension   . Sleep apnea 2012   used CPAP 2 yrs. ago, feels he sleeps better w/o, no longer using     Past Surgical History:  Procedure Laterality Date  . ARM NEUROPLASTY     at 14 yrs. of age- fell off tractor- had fracture & repair *& later- 40's had  transplantation of a nerve at the elbow  . BACK SURGERY     x4 back surgery x2 fusion -  . CARPAL TUNNEL RELEASE Right   . COLONOSCOPY    . PLEURAL EFFUSION DRAINAGE Left 09/09/2017   Procedure: DRAINAGE OF PLEURAL EFFUSION;  Surgeon: [name], MD;  Location: [facility];  Service: Thoracic;  Laterality: Left;  . POSTERIOR CERVICAL FUSION/FORAMINOTOMY N/A 06/24/2014   Procedure: Posterior Cervical Three-Seven Fusion with Lateral Mass Fixation;  Surgeon: Erline Levine, MD;  Location: [facility];  Service: Neurosurgery;  Laterality: N/A;  C3-C7 posterior cervical fusion with lateral mass fixation  . SHOULDER SURGERY Bilateral   . VIDEO ASSISTED THORACOSCOPY  (VATS)/DECORTICATION Left 09/09/2017   Procedure: VIDEO ASSISTED THORACOSCOPY (VATS)/DECORTICATION;  Surgeon: [name], MD;  Location: [facility];  Service: Thoracic;  Laterality: Left;  Marland Kitchen VIDEO BRONCHOSCOPY  09/09/2017   Procedure: VIDEO BRONCHOSCOPY;  Surgeon: [name], MD;  Location: [facility];  Service: Thoracic;;    Prior to Admission medications   Medication Sig Start Date End Date Taking? Authorizing Provider  amoxicillin-clavulanate (AUGMENTIN) 875-125 MG tablet Take 1 tablet by mouth every 12 (twelve) hours. Patient taking differently: Take 1 tablet by mouth every 12 (twelve) hours. 5 week course (70 doses) started 09/15/17 09/14/17 10/19/17 Yes Danford, Suann Larry, MD  aspirin EC 81 MG tablet Take 81 mg by mouth daily.   Yes [provider]  b complex vitamins tablet Take 1 tablet by mouth daily.     Yes [provider]  Cholecalciferol (VITAMIN D) 2000 UNITS CAPS Take 2,000 Units by mouth daily.    Yes [provider]  docusate sodium (COLACE) 100 MG capsule Take 300 mg by mouth daily.    Yes [provider]  finasteride (PROSCAR) 5 MG tablet Take 5 mg by mouth daily.   Yes [provider]  Flaxseed, Linseed, (FLAX SEED OIL) 1000 MG CAPS Take 1,000 mg by mouth daily.    Yes [provider]  fluocinonide cream (LIDEX) 3.24 % Apply 1 application topically daily as needed (itching).   Yes [provider]  HYDROmorphone (DILAUDID) 2 MG tablet Take 2 mg by mouth 4 (four) times daily as needed for severe pain.   Yes [provider]  L-Lysine 500 MG CAPS Take 500 mg by mouth daily.    Yes [provider]  Multiple Vitamin (MULTIVITAMIN WITH MINERALS) TABS tablet Take 1 tablet by mouth daily.   Yes [provider]  nortriptyline (PAMELOR) 25 MG capsule Take 25 mg by mouth at bedtime.    Yes [provider]  pregabalin (LYRICA) 150 MG capsule Take 150 mg by mouth 2 (two) times  daily.   Yes [provider]  Probiotic Product (PROBIOTIC PO) Take 1 tablet by mouth 2 (two) times daily.   Yes [provider]  pyridOXINE (VITAMIN B-6) 100 MG tablet Take 100 mg by mouth daily.     Yes [provider]  saw palmetto 80 MG capsule Take 240 mg by mouth daily.    Yes [provider]  Senna-Psyllium (PERDIEM PO) Take 3 tablets by mouth 2 (two) times daily as needed (constipation).    Yes [provider]  simvastatin (ZOCOR) 40 MG tablet Take 40 mg by mouth at bedtime.    Yes [provider]  tiZANidine (ZANAFLEX) 2 MG tablet Take 2 mg by mouth 3 (three) times daily.    Yes [provider]  triamterene-hydrochlorothiazide (MAXZIDE-25) 37.5-25 MG per tablet Take 1-2 tablets by mouth See admin instructions. Take 1 tablet by mouth daily, increase to 2 tablets as needed for ankle swelling   Yes [provider]  vitamin C (ASCORBIC ACID) 500 MG tablet Take 500 mg by mouth 2 (two) times daily.   Yes [provider]  mineral oil liquid Take 60 mLs by mouth daily at 3 pm.    [provider]    Scheduled Meds: . amoxicillin-clavulanate  1 tablet Oral Q12H  . docusate sodium  300 mg Oral Daily  . feeding supplement  1 Container Oral TID BM  . feeding supplement (ENSURE ENLIVE)  237 mL Oral BID BM  . finasteride  5 mg Oral Daily  . pantoprazole (PROTONIX) IV  40 mg Intravenous Q12H  . pregabalin  150 mg Oral BID  . pyridOXINE  100 mg Oral Daily  . simvastatin  40 mg Oral QHS  . tiZANidine  2 mg Oral TID  . triamterene-hydrochlorothiazide  1 tablet Oral Daily   Continuous Infusions: PRN Meds:.acetaminophen **OR** acetaminophen, HYDROmorphone, ondansetron **OR** ondansetron (ZOFRAN) IV  Allergies as of 09/20/2017 - Review Complete 09/20/2017  Allergen Reaction Noted  . Diazepam Other (See Comments)     Family History  Problem Relation Age of Onset  . Pancreatic cancer Mother     Social  History   Socioeconomic History  . Marital status: Married    Spouse name: Not on file  . Number of children: Not on file  . Years of education: Not on file  . Highest education level: Not on file  Social Needs  . Financial resource strain: Not on file  . Food insecurity - worry: Not on file  . Food insecurity - inability: Not on file  . Transportation needs - medical: Not on file  . Transportation needs - non-medical: Not on file  Occupational History  . Occupation: Retired    Comment: Worked for [employer] and retired in [place] Use  . Smoking status: Former Smoker    Years: 24.00    Types: Pipe    Last attempt to quit:  09/24/1978    Years since quitting: 39.0  . Smokeless tobacco: Never Used  Substance and Sexual Activity  . Alcohol use: Yes    Comment: 3 drinks/day - gin or vodka   . Drug use: No  . Sexual activity: Not on file  Other Topics Concern  . Not on file  Social History Narrative  . Not on file    Review of Systems: All negative except as stated above in HPI.  Physical Exam: Vital signs: Vitals:   09/21/17 0215 09/21/17 0442  BP: (!) 103/53 124/72  Pulse: 82 84  Resp: 18 17  Temp: 98.1 F (36.7 C) 98.5 F (36.9 C)  SpO2: 96% 97%   Last BM Date: 09/18/17(PTA) General:   Alert,  Elderly, thin, no acute distress Head: normocephalic, atraumatic Eyes: anicteric sclera ENT: oropharynx clear Neck: supple, nontender Lungs:  Clear throughout to auscultation.   No wheezes, crackles, or rhonchi. No acute distress. Heart:  Regular rate and rhythm; no murmurs, clicks, rubs,  or gallops. Abdomen: soft, nontender, nondistended, +BS  Rectal:  Deferred Ext: no edema  GI:  Lab Results: Recent Labs    09/20/17 1520 09/21/17 0128  WBC 14.1* 10.0  HGB 8.1* 7.3*  HCT 26.8* 23.0*  PLT 382 352   BMET Recent Labs    09/20/17 1520 09/21/17 0128  NA 138 136  K 4.6 3.7  CL 101 102  CO2 26 22  GLUCOSE 132* 113*  BUN 17 18  CREATININE 1.50* 1.42*   CALCIUM 8.8* 8.3*   LFT No results for input(s): PROT, ALBUMIN, AST, ALT, ALKPHOS, BILITOT, BILIDIR, IBILI in the last 72 hours. PT/INR No results for input(s): LABPROT, INR in the last 72 hours.   Studies/Results: Dg Chest 2 View  Result Date: 09/20/2017 CLINICAL DATA:  Short of breath EXAM: CHEST  2 VIEW COMPARISON:  Chest x-ray 09/20/2017, chest CT 08/13/2017 FINDINGS: Loculated left pleural fluid unchanged from prior studies. Left lower lobe airspace disease also unchanged, likely atelectasis based on the prior CT. Pneumonia not excluded Right lung clear.  Negative for heart failure IMPRESSION: No interval change in loculated left effusion and left lower lobe airspace disease. Electronically Signed   By: Franchot Gallo M.D.   On: 09/20/2017 19:47    Impression/Plan: 83 yo with symptomatic anemia with heme positive stool. No overt bleeding but due to recent high doses of NSAID use could have a peptic ulcer as the source of his anemia. Follow H/Hs. EGD on 09/23/17 with Propofol sedation to evaluate for PUD. Continue Protonix 40 mg IV Q 12 hours. Soft diet today and tomorrow and then full liquids tomorrow evening. NPO p MN Sunday night for EGD Monday. If EGD is unrevealing, then would manage conservatively and not pursue any additional GI endoscopic procedures due to advanced age and comorbidities.    LOS: 0 days   [place] C.  09/21/2017, 11:26 AM  Pager [phone]  AFTER 5 pm or on weekends please call [phone]

## 2017-09-21 NOTE — Progress Notes (Signed)
Patient telemetry box continued to say leadset unplugged after changing stickers, wiping down, taking the battery out.  Had to place patient on a new box, box 34.

## 2017-09-21 NOTE — Progress Notes (Signed)
Patient seen and examined this morning, admitted overnight by Dr. Glyn Ade, H&P reviewed and agree assessment and plan  In brief, this is a pleasant 83 year old male with hypertension, sleep apnea, hyperlipidemia, with a recent admission and discharge last week being treated for empyema status post VATS on Augmentin for a total of 6 weeks, who presents with weakness and fatigue.  He was found to have a low hemoglobin of 7.3, was found to have guaiac positive stools and his hemoglobin has been overall trending down over the last month.  He was admitted with symptomatic anemia with concern for GI bleed.  Symptomatic anemia  -Status post 2 units of packed red blood cells, patient feels significantly better this morning -Repeat CBC pending  Possible GI bleeding -Fecal occult was positive, hemoglobin last month was in the 11 range, slowly decreasing and now at 7.3, suspect subacute bleed.  He has been using some NSAIDs prior to most recent hospital stay due to pleuritic type chest pain -Consulted GI, appreciate input  Recently treated for empyema and is on Augmentin.  Hypertension on Maxzide.  Hyperlipidemia on statins.  Sleep apnea on CPAP.  History of BPH on Proscar.  Chronic pain on narcotics.  Chronic kidney disease stage III creatinine appears to be at baseline.   Costin M. Cruzita Lederer, MD [facility] [phone]  If 7PM-7AM, please contact night-coverage [email or web address] Password [email or web address]

## 2017-09-21 NOTE — Consult Note (Signed)
Referring Provider: Dr. Cruzita Lederer Primary Care Physician:  Hulan Fess, MD Primary Gastroenterologist:  Dr. Paulita Fujita  Reason for Consultation:  Anemia; Heme positive stool  HPI: Aaron Schmidt is a 83 y.o. male who is s/p VATS for an empyema last week and sent home on Augmentin who was readmitted yesterday for severe weakness. Hgb 8.1 yesterday (7.6 on 09/14/17) and 7.3 last night. Denies melena, hematochezia or abdominal pain. He had been taking high doses of Advil for a week recently stating he was taking the Advil (200 mg each) 6 pills QID for 7 days. EGD in 2013 showed gastritis and colonoscopy in 2013 where several tubular adenomas were removed and it also showed diverticulosis (Dr. Paulita Fujita). Given one unit PRBCs last night. Feels better than he did at admit and sitting in bedside chair. Wife at bedside.   Past Medical History:  Diagnosis Date  . Arthritis    Degeneration spine & stenosis  . History of kidney stones   . Hypertension    has a histroy of  . Hypotension   . Sleep apnea 2012   used CPAP 2 yrs. ago, feels he sleeps better w/o, no longer using     Past Surgical History:  Procedure Laterality Date  . ARM NEUROPLASTY     at 14 yrs. of age- fell off tractor- had fracture & repair *& later- 44's had  transplantation of a nerve at the elbow  . BACK SURGERY     x4 back surgery x2 fusion -  . CARPAL TUNNEL RELEASE Right   . COLONOSCOPY    . PLEURAL EFFUSION DRAINAGE Left 09/09/2017   Procedure: DRAINAGE OF PLEURAL EFFUSION;  Surgeon: [name], MD;  Location: [facility];  Service: Thoracic;  Laterality: Left;  . POSTERIOR CERVICAL FUSION/FORAMINOTOMY N/A 06/24/2014   Procedure: Posterior Cervical Three-Seven Fusion with Lateral Mass Fixation;  Surgeon: Erline Levine, MD;  Location: [facility];  Service: Neurosurgery;  Laterality: N/A;  C3-C7 posterior cervical fusion with lateral mass fixation  . SHOULDER SURGERY Bilateral   . VIDEO ASSISTED THORACOSCOPY  (VATS)/DECORTICATION Left 09/09/2017   Procedure: VIDEO ASSISTED THORACOSCOPY (VATS)/DECORTICATION;  Surgeon: [name], MD;  Location: [facility];  Service: Thoracic;  Laterality: Left;  Marland Kitchen VIDEO BRONCHOSCOPY  09/09/2017   Procedure: VIDEO BRONCHOSCOPY;  Surgeon: [name], MD;  Location: [facility];  Service: Thoracic;;    Prior to Admission medications   Medication Sig Start Date End Date Taking? Authorizing Provider  amoxicillin-clavulanate (AUGMENTIN) 875-125 MG tablet Take 1 tablet by mouth every 12 (twelve) hours. Patient taking differently: Take 1 tablet by mouth every 12 (twelve) hours. 5 week course (70 doses) started 09/15/17 09/14/17 10/19/17 Yes Danford, Suann Larry, MD  aspirin EC 81 MG tablet Take 81 mg by mouth daily.   Yes [provider]  b complex vitamins tablet Take 1 tablet by mouth daily.     Yes [provider]  Cholecalciferol (VITAMIN D) 2000 UNITS CAPS Take 2,000 Units by mouth daily.    Yes [provider]  docusate sodium (COLACE) 100 MG capsule Take 300 mg by mouth daily.    Yes [provider]  finasteride (PROSCAR) 5 MG tablet Take 5 mg by mouth daily.   Yes [provider]  Flaxseed, Linseed, (FLAX SEED OIL) 1000 MG CAPS Take 1,000 mg by mouth daily.    Yes [provider]  fluocinonide cream (LIDEX) 1.91 % Apply 1 application topically daily as needed (itching).   Yes [provider]  HYDROmorphone (DILAUDID) 2 MG tablet Take 2 mg by mouth 4 (four) times daily as needed for severe pain.   Yes [provider]  L-Lysine 500 MG CAPS Take 500 mg by mouth daily.    Yes [provider]  Multiple Vitamin (MULTIVITAMIN WITH MINERALS) TABS tablet Take 1 tablet by mouth daily.   Yes [provider]  nortriptyline (PAMELOR) 25 MG capsule Take 25 mg by mouth at bedtime.    Yes [provider]  pregabalin (LYRICA) 150 MG capsule Take 150 mg by mouth 2 (two) times  daily.   Yes [provider]  Probiotic Product (PROBIOTIC PO) Take 1 tablet by mouth 2 (two) times daily.   Yes [provider]  pyridOXINE (VITAMIN B-6) 100 MG tablet Take 100 mg by mouth daily.     Yes [provider]  saw palmetto 80 MG capsule Take 240 mg by mouth daily.    Yes [provider]  Senna-Psyllium (PERDIEM PO) Take 3 tablets by mouth 2 (two) times daily as needed (constipation).    Yes [provider]  simvastatin (ZOCOR) 40 MG tablet Take 40 mg by mouth at bedtime.    Yes [provider]  tiZANidine (ZANAFLEX) 2 MG tablet Take 2 mg by mouth 3 (three) times daily.    Yes [provider]  triamterene-hydrochlorothiazide (MAXZIDE-25) 37.5-25 MG per tablet Take 1-2 tablets by mouth See admin instructions. Take 1 tablet by mouth daily, increase to 2 tablets as needed for ankle swelling   Yes [provider]  vitamin C (ASCORBIC ACID) 500 MG tablet Take 500 mg by mouth 2 (two) times daily.   Yes [provider]  mineral oil liquid Take 60 mLs by mouth daily at 3 pm.    [provider]    Scheduled Meds: . amoxicillin-clavulanate  1 tablet Oral Q12H  . docusate sodium  300 mg Oral Daily  . feeding supplement  1 Container Oral TID BM  . feeding supplement (ENSURE ENLIVE)  237 mL Oral BID BM  . finasteride  5 mg Oral Daily  . pantoprazole (PROTONIX) IV  40 mg Intravenous Q12H  . pregabalin  150 mg Oral BID  . pyridOXINE  100 mg Oral Daily  . simvastatin  40 mg Oral QHS  . tiZANidine  2 mg Oral TID  . triamterene-hydrochlorothiazide  1 tablet Oral Daily   Continuous Infusions: PRN Meds:.acetaminophen **OR** acetaminophen, HYDROmorphone, ondansetron **OR** ondansetron (ZOFRAN) IV  Allergies as of 09/20/2017 - Review Complete 09/20/2017  Allergen Reaction Noted  . Diazepam Other (See Comments)     Family History  Problem Relation Age of Onset  . Pancreatic cancer Mother     Social  History   Socioeconomic History  . Marital status: Married    Spouse name: Not on file  . Number of children: Not on file  . Years of education: Not on file  . Highest education level: Not on file  Social Needs  . Financial resource strain: Not on file  . Food insecurity - worry: Not on file  . Food insecurity - inability: Not on file  . Transportation needs - medical: Not on file  . Transportation needs - non-medical: Not on file  Occupational History  . Occupation: Retired    Comment: Worked for [employer] and retired in [place] Use  . Smoking status: Former Smoker    Years: 24.00    Types: Pipe    Last attempt to quit:  09/24/1978    Years since quitting: 39.0  . Smokeless tobacco: Never Used  Substance and Sexual Activity  . Alcohol use: Yes    Comment: 3 drinks/day - gin or vodka   . Drug use: No  . Sexual activity: Not on file  Other Topics Concern  . Not on file  Social History Narrative  . Not on file    Review of Systems: All negative except as stated above in HPI.  Physical Exam: Vital signs: Vitals:   09/21/17 0215 09/21/17 0442  BP: (!) 103/53 124/72  Pulse: 82 84  Resp: 18 17  Temp: 98.1 F (36.7 C) 98.5 F (36.9 C)  SpO2: 96% 97%   Last BM Date: 09/18/17(PTA) General:   Alert,  Elderly, thin, no acute distress Head: normocephalic, atraumatic Eyes: anicteric sclera ENT: oropharynx clear Neck: supple, nontender Lungs:  Clear throughout to auscultation.   No wheezes, crackles, or rhonchi. No acute distress. Heart:  Regular rate and rhythm; no murmurs, clicks, rubs,  or gallops. Abdomen: soft, nontender, nondistended, +BS  Rectal:  Deferred Ext: no edema  GI:  Lab Results: Recent Labs    09/20/17 1520 09/21/17 0128  WBC 14.1* 10.0  HGB 8.1* 7.3*  HCT 26.8* 23.0*  PLT 382 352   BMET Recent Labs    09/20/17 1520 09/21/17 0128  NA 138 136  K 4.6 3.7  CL 101 102  CO2 26 22  GLUCOSE 132* 113*  BUN 17 18  CREATININE 1.50* 1.42*   CALCIUM 8.8* 8.3*   LFT No results for input(s): PROT, ALBUMIN, AST, ALT, ALKPHOS, BILITOT, BILIDIR, IBILI in the last 72 hours. PT/INR No results for input(s): LABPROT, INR in the last 72 hours.   Studies/Results: Dg Chest 2 View  Result Date: 09/20/2017 CLINICAL DATA:  Short of breath EXAM: CHEST  2 VIEW COMPARISON:  Chest x-ray 09/20/2017, chest CT 08/13/2017 FINDINGS: Loculated left pleural fluid unchanged from prior studies. Left lower lobe airspace disease also unchanged, likely atelectasis based on the prior CT. Pneumonia not excluded Right lung clear.  Negative for heart failure IMPRESSION: No interval change in loculated left effusion and left lower lobe airspace disease. Electronically Signed   By: Franchot Gallo M.D.   On: 09/20/2017 19:47    Impression/Plan: 83 yo with symptomatic anemia with heme positive stool. No overt bleeding but due to recent high doses of NSAID use could have a peptic ulcer as the source of his anemia. Follow H/Hs. EGD on 09/23/17 with Propofol sedation to evaluate for PUD. Continue Protonix 40 mg IV Q 12 hours. Soft diet today and tomorrow and then full liquids tomorrow evening. NPO p MN Sunday night for EGD Monday. If EGD is unrevealing, then would manage conservatively and not pursue any additional GI endoscopic procedures due to advanced age and comorbidities.    LOS: 0 days   [place] C.  09/21/2017, 11:26 AM  Pager [phone]  AFTER 5 pm or on weekends please call [phone]

## 2017-09-21 NOTE — Progress Notes (Signed)
Patient arrived to the unit via bed from the ED.  Patient is alert and oriented. Complaints of chronic back .Skin assessment complete.  Bruises to the right forearm.  Incision with sutures to the left lower chest and two small areas with sutures to the under the incision.  Educated the patient on how to reach the staff on the unit .  Lowered the bed and activated the bed alarm.  Will continue to monitor the patient and notify as needed.

## 2017-09-21 NOTE — Progress Notes (Signed)
Initial Nutrition Assessment  DOCUMENTATION CODES:   Not applicable  INTERVENTION:  Continue Ensure Enlive po BID, each supplement provides 350 kcal and 20 grams of protein.  Continue Boost Breeze po TID, each supplement provides 250 kcal and 9 grams of protein.  Encourage adequate PO intake.   NUTRITION DIAGNOSIS:   Increased nutrient needs related to acute illness as evidenced by estimated needs.  GOAL:   Patient will meet greater than or equal to 90% of their needs  MONITOR:   PO intake, Supplement acceptance, Labs, Weight trends, I & O's, Skin  REASON FOR ASSESSMENT:   Malnutrition Screening Tool    ASSESSMENT:   83 y.o. male who is s/p VATS for an empyema last week and sent home on Augmentin who was readmitted yesterday for severe weakness. Hgb 7.3 12/28.  Plans for EGD on Monday.    Wife at bedside reports pt has been eating well over the past week and his appetite has improved. Noted no recent percent meal completion recorded. Usual body weight reported to be ~195 lbs. Pt with a 10% weight loss in 1 month. Wife reports pt has been consuming Ensure at home at least once a day. Pt currently has Ensure and Boost Breeze ordered. RD to continue with current orders. Pt encouraged to eat his food at meals.    NUTRITION - FOCUSED PHYSICAL EXAM:  Depletion may be related to the natural aging process.    Most Recent Value  Orbital Region  Unable to assess  Upper Arm Region  No depletion  Thoracic and Lumbar Region  No depletion  Buccal Region  Unable to assess  Temple Region  Unable to assess  Clavicle Bone Region  Moderate depletion  Clavicle and Acromion Bone Region  Moderate depletion  Scapular Bone Region  Unable to assess  Dorsal Hand  Unable to assess  Patellar Region  No depletion  Anterior Thigh Region  No depletion  Posterior Calf Region  No depletion  Edema (RD Assessment)  Mild  Hair  Reviewed  Eyes  Reviewed  Mouth  Reviewed  Skin  Reviewed  Nails   Reviewed       Diet Order:  DIET SOFT Room service appropriate? Yes; Fluid consistency: Thin Diet full liquid Room service appropriate? Yes; Fluid consistency: Thin Diet NPO time specified  EDUCATION NEEDS:   Not appropriate for education at this time  Skin:  Skin Assessment: Skin Integrity Issues: Skin Integrity Issues:: Incisions Incisions: chest  Last BM:  12/26  Height:   Ht Readings from Last 1 Encounters:  09/20/17 5\' 10"  (1.778 m)    Weight:   Wt Readings from Last 1 Encounters:  09/21/17 175 lb 11.3 oz (79.7 kg)    Ideal Body Weight:  75.45 kg  BMI:  Body mass index is 25.21 kg/m.  Estimated Nutritional Needs:   Kcal:  1900-2100  Protein:  80-95 grams  Fluid:  1.9 - 2.1 L/day    Corrin Parker, MS, RD, LDN Pager # [phone] After hours/ weekend pager # [phone]

## 2017-09-21 NOTE — H&P (Addendum)
History and Physical    Aaron Schmidt TGG:[id number] DOB: 01-21-1933 DOA: 09/20/2017  PCP: Hulan Fess, MD  Patient coming from: Home.  Chief Complaint: Weakness.  HPI: Aaron Schmidt is a 83 y.o. male with history of hypertension, sleep apnea, hyperlipidemia who was recently admitted and discharged last week after being treated for empyema status post VATS and is on Augmentin for another 5 weeks presents to the ER because of increasing fatigue and weakness since discharge.  Denies any chest pain fever chills productive cough nausea vomiting or diarrhea.  Has not noticed any blood in the stools.  ED Course: In the ER patient's hemoglobin is around 7.3.  Patient hemoglobin has remained so for during last week admission but last month it was around 11.  Stool for occult blood has been positive.  Patient denies noticing any obvious bleeding.  Patient states that he did take some NSAIDs for his chest pain prior to his previous admission.  Patient is admitted for symptomatic anemia and possible GI bleed.  EKG shows normal sinus rhythm.  Review of Systems: As per HPI, rest all negative.   Past Medical History:  Diagnosis Date  . Arthritis    Degeneration spine & stenosis  . History of kidney stones   . Hypertension    has a histroy of  . Hypotension   . Sleep apnea 2012   used CPAP 2 yrs. ago, feels he sleeps better w/o, no longer using     Past Surgical History:  Procedure Laterality Date  . ARM NEUROPLASTY     at 14 yrs. of age- fell off tractor- had fracture & repair *& later- 77's had  transplantation of a nerve at the elbow  . BACK SURGERY     x4 back surgery x2 fusion -  . CARPAL TUNNEL RELEASE Right   . COLONOSCOPY    . PLEURAL EFFUSION DRAINAGE Left 09/09/2017   Procedure: DRAINAGE OF PLEURAL EFFUSION;  Surgeon: [name], MD;  Location: [place];  Service: Thoracic;  Laterality: Left;  . POSTERIOR CERVICAL FUSION/FORAMINOTOMY N/A 06/24/2014   Procedure:  Posterior Cervical Three-Seven Fusion with Lateral Mass Fixation;  Surgeon: Erline Levine, MD;  Location: [facility];  Service: Neurosurgery;  Laterality: N/A;  C3-C7 posterior cervical fusion with lateral mass fixation  . SHOULDER SURGERY Bilateral   . VIDEO ASSISTED THORACOSCOPY (VATS)/DECORTICATION Left 09/09/2017   Procedure: VIDEO ASSISTED THORACOSCOPY (VATS)/DECORTICATION;  Surgeon: [name], MD;  Location: [facility];  Service: Thoracic;  Laterality: Left;  Marland Kitchen VIDEO BRONCHOSCOPY  09/09/2017   Procedure: VIDEO BRONCHOSCOPY;  Surgeon: [name], MD;  Location: [place];  Service: Thoracic;;     reports that he quit smoking about 39 years ago. His smoking use included pipe. He quit after 24.00 years of use. he has never used smokeless tobacco. He reports that he drinks alcohol. He reports that he does not use drugs.  Allergies  Allergen Reactions  . Diazepam Other (See Comments)    "makes goofy"    Family History  Problem Relation Age of Onset  . Pancreatic cancer Mother     Prior to Admission medications   Medication Sig Start Date End Date Taking? Authorizing Provider  amoxicillin-clavulanate (AUGMENTIN) 875-125 MG tablet Take 1 tablet by mouth every 12 (twelve) hours. Patient taking differently: Take 1 tablet by mouth every 12 (twelve) hours. 5 week course (70 doses) started 09/15/17 09/14/17 10/19/17 Yes Danford, Suann Larry, MD  aspirin EC 81 MG tablet Take  81 mg by mouth daily.   Yes [provider]  b complex vitamins tablet Take 1 tablet by mouth daily.     Yes [provider]  Cholecalciferol (VITAMIN D) 2000 UNITS CAPS Take 2,000 Units by mouth daily.    Yes [provider]  docusate sodium (COLACE) 100 MG capsule Take 300 mg by mouth daily.    Yes [provider]  finasteride (PROSCAR) 5 MG tablet Take 5 mg by mouth daily.   Yes [provider]  Flaxseed, Linseed, (FLAX SEED OIL) 1000 MG CAPS Take 1,000 mg by  mouth daily.    Yes [provider]  fluocinonide cream (LIDEX) 7.03 % Apply 1 application topically daily as needed (itching).   Yes [provider]  HYDROmorphone (DILAUDID) 2 MG tablet Take 2 mg by mouth 4 (four) times daily as needed for severe pain.   Yes [provider]  L-Lysine 500 MG CAPS Take 500 mg by mouth daily.    Yes [provider]  Multiple Vitamin (MULTIVITAMIN WITH MINERALS) TABS tablet Take 1 tablet by mouth daily.   Yes [provider]  nortriptyline (PAMELOR) 25 MG capsule Take 25 mg by mouth at bedtime.    Yes [provider]  pregabalin (LYRICA) 150 MG capsule Take 150 mg by mouth 2 (two) times daily.   Yes [provider]  Probiotic Product (PROBIOTIC PO) Take 1 tablet by mouth 2 (two) times daily.   Yes [provider]  pyridOXINE (VITAMIN B-6) 100 MG tablet Take 100 mg by mouth daily.     Yes [provider]  saw palmetto 80 MG capsule Take 240 mg by mouth daily.    Yes [provider]  Senna-Psyllium (PERDIEM PO) Take 3 tablets by mouth 2 (two) times daily as needed (constipation).    Yes [provider]  simvastatin (ZOCOR) 40 MG tablet Take 40 mg by mouth at bedtime.    Yes [provider]  tiZANidine (ZANAFLEX) 2 MG tablet Take 2 mg by mouth 3 (three) times daily.    Yes [provider]  triamterene-hydrochlorothiazide (MAXZIDE-25) 37.5-25 MG per tablet Take 1-2 tablets by mouth See admin instructions. Take 1 tablet by mouth daily, increase to 2 tablets as needed for ankle swelling   Yes [provider]  vitamin C (ASCORBIC ACID) 500 MG tablet Take 500 mg by mouth 2 (two) times daily.   Yes [provider]  mineral oil liquid Take 60 mLs by mouth daily at 3 pm.    [provider]    Physical Exam: Vitals:   09/20/17 2130 09/20/17 2145 09/20/17 2309 09/20/17 2311  BP: (!) 125/49 134/61 (!) 152/64   Pulse: 92 93 99   Resp:  15 16 17    Temp:   98.1 F (36.7 C)   TempSrc:   Oral   SpO2: 98% 97% 97%   Weight:    79.7 kg (175 lb 11.3 oz)  Height:    5\' 10"  (1.778 m)      Constitutional: Moderately built and nourished. Vitals:   09/20/17 2130 09/20/17 2145 09/20/17 2309 09/20/17 2311  BP: (!) 125/49 134/61 (!) 152/64   Pulse: 92 93 99   Resp: 15 16 17    Temp:   98.1 F (36.7 C)   TempSrc:   Oral   SpO2: 98% 97% 97%   Weight:    79.7 kg (175 lb 11.3 oz)  Height:    5\' 10"  (1.778 m)  Eyes: Anicteric mild pallor. ENMT: No discharge from the ears eyes nose or mouth. Neck: No mass felt.  No neck rigidity. Respiratory: No rhonchi or crepitations. Cardiovascular: S1-S2 heard no murmurs appreciated. Abdomen: Soft nontender bowel sounds present. Musculoskeletal: No edema.  No joint effusion. Skin: No rash.  Skin appears warm. Neurologic: Alert awake oriented to time place and person.  Moves all extremities 5 x 5. Psychiatric: Appears normal.  Normal affect.   Labs on Admission: I have personally reviewed following labs and imaging studies  CBC: Recent Labs  Lab 09/14/17 0312 09/20/17 1520  WBC 13.1* 14.1*  HGB 7.6* 8.1*  HCT 24.7* 26.8*  MCV 94.6 92.7  PLT 367 267   Basic Metabolic Panel: Recent Labs  Lab 09/14/17 0312 09/20/17 1520  NA 139 138  K 4.3 4.6  CL 108 101  CO2 23 26  GLUCOSE 95 132*  BUN 12 17  CREATININE 1.43* 1.50*  CALCIUM 8.3* 8.8*  MG 2.0  --    GFR: Estimated Creatinine Clearance: 39.2 mL/min (A) (by C-G formula based on SCr of 1.5 mg/dL (H)). Liver Function Tests: No results for input(s): AST, ALT, ALKPHOS, BILITOT, PROT, ALBUMIN in the last 168 hours. No results for input(s): LIPASE, AMYLASE in the last 168 hours. No results for input(s): AMMONIA in the last 168 hours. Coagulation Profile: No results for input(s): INR, PROTIME in the last 168 hours. Cardiac Enzymes: No results for input(s): CKTOTAL, CKMB, CKMBINDEX, TROPONINI in the last 168 hours. BNP  (last 3 results) No results for input(s): PROBNP in the last 8760 hours. HbA1C: No results for input(s): HGBA1C in the last 72 hours. CBG: Recent Labs  Lab 09/20/17 1526  GLUCAP 97   Lipid Profile: No results for input(s): CHOL, HDL, LDLCALC, TRIG, CHOLHDL, LDLDIRECT in the last 72 hours. Thyroid Function Tests: No results for input(s): TSH, T4TOTAL, FREET4, T3FREE, THYROIDAB in the last 72 hours. Anemia Panel: No results for input(s): VITAMINB12, FOLATE, FERRITIN, TIBC, IRON, RETICCTPCT in the last 72 hours. Urine analysis:    Component Value Date/Time   COLORURINE YELLOW 09/20/2017 1444   APPEARANCEUR CLEAR 09/20/2017 1444   LABSPEC 1.009 09/20/2017 1444   PHURINE 7.0 09/20/2017 1444   GLUCOSEU NEGATIVE 09/20/2017 1444   HGBUR NEGATIVE 09/20/2017 1444   BILIRUBINUR NEGATIVE 09/20/2017 1444   KETONESUR NEGATIVE 09/20/2017 1444   PROTEINUR NEGATIVE 09/20/2017 1444   UROBILINOGEN 0.2 10/20/2010 1625   NITRITE NEGATIVE 09/20/2017 1444   LEUKOCYTESUR NEGATIVE 09/20/2017 1444   Sepsis Labs: @LABRCNTIP (procalcitonin:4,lacticidven:4) )No results found for this or any previous visit (from the past 240 hour(s)).   Radiological Exams on Admission: Dg Chest 2 View  Result Date: 09/20/2017 CLINICAL DATA:  Short of breath EXAM: CHEST  2 VIEW COMPARISON:  Chest x-ray 09/20/2017, chest CT 08/13/2017 FINDINGS: Loculated left pleural fluid unchanged from prior studies. Left lower lobe airspace disease also unchanged, likely atelectasis based on the prior CT. Pneumonia not excluded Right lung clear.  Negative for heart failure IMPRESSION: No interval change in loculated left effusion and left lower lobe airspace disease. Electronically Signed   By: Franchot Gallo M.D.   On: 09/20/2017 19:47    EKG: Independently reviewed.  Normal sinus rhythm.  Assessment/Plan Principal Problem:   Symptomatic anemia Active Problems:   Essential hypertension   Anemia    1. Symptomatic anemia -2  units of PRBC transfusion has been ordered.  Recheck hemoglobin after transfusion.  2. Possible GI bleed -patient stool for occult blood was positive.  Patient  states recently he had been taking some NSAIDs.  Will keep patient on Protonix IV.  Patient's last colonoscopy as per the patient was 12 years ago.  May need gastroenterology consult in the morning. 3. Recently treated for empyema and is on Augmentin. 4. Hypertension on Maxzide. 5. Hyperlipidemia on statins. 6. Sleep apnea on CPAP. 7. History of BPH on Proscar. 8. Chronic pain on narcotics. 9. Chronic kidney disease stage III creatinine appears to be at baseline.  Please check CK levels and TSH after transfusion.   DVT prophylaxis: SCDs. Code Status: Full code. Family Communication: Discussed with patient. Disposition Plan: Home. Consults called: None. Admission status: Observation.   Rise Patience MD [facility] Pager [phone].  If 7PM-7AM, please contact night-coverage [email or web address] Password [email or web address]  09/21/2017, 12:21 AM

## 2017-09-22 DIAGNOSIS — K922 Gastrointestinal hemorrhage, unspecified: Secondary | ICD-10-CM | POA: Diagnosis not present

## 2017-09-22 DIAGNOSIS — I10 Essential (primary) hypertension: Secondary | ICD-10-CM | POA: Diagnosis not present

## 2017-09-22 DIAGNOSIS — D649 Anemia, unspecified: Secondary | ICD-10-CM | POA: Diagnosis not present

## 2017-09-22 LAB — BASIC METABOLIC PANEL
Anion gap: 10 (ref 5–15)
BUN: 19 mg/dL (ref 6–20)
CO2: 27 mmol/L (ref 22–32)
Calcium: 9.1 mg/dL (ref 8.9–10.3)
Chloride: 101 mmol/L (ref 101–111)
Creatinine, Ser: 1.58 mg/dL — ABNORMAL HIGH (ref 0.61–1.24)
GFR calc Af Amer: 45 mL/min — ABNORMAL LOW (ref >60)
GFR calc non Af Amer: 39 mL/min — ABNORMAL LOW (ref >60)
Glucose, Bld: 120 mg/dL — ABNORMAL HIGH (ref 65–99)
Potassium: 4 mmol/L (ref 3.5–5.1)
Sodium: 138 mmol/L (ref 135–145)

## 2017-09-22 LAB — BPAM RBC
Blood Product Expiration Date: 201901232359
ISSUE DATE / TIME: 201812290143
Unit Type and Rh: 6200

## 2017-09-22 LAB — CBC
HCT: 30.4 % — ABNORMAL LOW (ref 39.0–52.0)
Hemoglobin: 9.4 g/dL — ABNORMAL LOW (ref 13.0–17.0)
MCH: 28.7 pg (ref 26.0–34.0)
MCHC: 30.9 g/dL (ref 30.0–36.0)
MCV: 92.7 fL (ref 78.0–100.0)
Platelets: 393 10*3/uL (ref 150–400)
RBC: 3.28 MIL/uL — ABNORMAL LOW (ref 4.22–5.81)
RDW: 15.7 % — ABNORMAL HIGH (ref 11.5–15.5)
WBC: 11.1 10*3/uL — ABNORMAL HIGH (ref 4.0–10.5)

## 2017-09-22 LAB — TYPE AND SCREEN
ABO/RH(D): A POS
Antibody Screen: NEGATIVE
Unit division: 0

## 2017-09-22 MED ORDER — SODIUM CHLORIDE 0.9 % IV SOLN: INTRAVENOUS | Status: DC

## 2017-09-22 MED ORDER — POLYETHYLENE GLYCOL 3350 17 G PO PACK
17.0000 g | PACK | Freq: Every day | ORAL | Status: DC | PRN
  Administered 2017-09-22: 17 g via ORAL
  Filled 2017-09-22: qty 1

## 2017-09-22 MED ORDER — SODIUM CHLORIDE 0.9 % IV SOLN
INTRAVENOUS | Status: DC
  Administered 2017-09-22: 23:00:00 via INTRAVENOUS

## 2017-09-22 NOTE — Progress Notes (Signed)
PROGRESS NOTE  Aaron Schmidt HWE:[id number] DOB: 06-11-1933 DOA: 09/20/2017 PCP: Hulan Fess, MD   LOS: 0 days   Brief Narrative / Interim history: 83 year old male with hypertension, sleep apnea, hyperlipidemia, with a recent admission and discharge last week being treated for empyema status post VATS on Augmentin for a total of 6 weeks, who presents with weakness and fatigue.  He was found to have a low hemoglobin of 7.3, was found to have guaiac positive stools and his hemoglobin has been overall trending down over the last month.  He was admitted with symptomatic anemia with concern for GI bleed.  Assessment & Plan: Principal Problem:   Symptomatic anemia Active Problems:   Essential hypertension   Anemia   Symptomatic anemia -Status post 2 units of packed red blood cells, patient feels significantly better this morning -Repeat CBC with stable hemoglobin, continue to closely monitor  Possible GI bleeding -Fecal occult was positive, hemoglobin last month was in the 11 range, slowly decreasing and now at 7.3 on admission, suspect subacute bleed.  He has been using some NSAIDs prior to most recent hospital stay due to pleuritic type chest pain -Consulted GI, appreciate input, he will undergo an EGD tomorrow 12/31  Recently treated for empyema and is on Augmentin.  Hypertension on Maxzide.  Within normal parameters, continue  Hyperlipidemia on statins.  Sleep apnea on CPAP.  History of BPH on Proscar.  Chronic pain on narcotics.  Chronic kidney disease stage III creatinine appears to be at baseline.  Stable today    DVT prophylaxis: SCDs Code Status: Full code Family Communication: no family at bedside Disposition Plan: home after EGD  Consultants:   GI  Procedures:   EGD - pending 12/31  Antimicrobials:  None    Subjective: - no chest pain, shortness of breath, no abdominal pain, nausea or vomiting.   Objective: Vitals:   09/21/17 1440  09/21/17 2223 09/22/17 0552 09/22/17 0941  BP: (!) 115/57 134/61 126/61 (!) 115/44  Pulse: 89 92 93   Resp: 17 16 16    Temp: 97.8 F (36.6 C) 98.2 F (36.8 C) 98 F (36.7 C)   TempSrc: Oral Oral Oral   SpO2: 100% 100% 96%   Weight:   78.8 kg (173 lb 12.8 oz)   Height:        Intake/Output Summary (Last 24 hours) at 09/22/2017 1101 Last data filed at 09/22/2017 0929 Gross per 24 hour  Intake 200 ml  Output -  Net 200 ml   Filed Weights   09/20/17 2311 09/21/17 0500 09/22/17 0552  Weight: 79.7 kg (175 lb 11.3 oz) 79.7 kg (175 lb 11.3 oz) 78.8 kg (173 lb 12.8 oz)    Examination:  Constitutional: NAD Eyes: lids and conjunctivae normal ENMT: Mucous membranes are moist.  Neck: normal, supple Respiratory: clear to auscultation bilaterally, no wheezing, no crackles. Cardiovascular: Regular rate and rhythm, no murmurs / rubs / gallops.  Abdomen: no tenderness. Bowel sounds positive.  Skin: no rashe Neurologic: non focal.  Psychiatric: Normal judgment and insight. Alert and oriented x 3. Normal mood.    Data Reviewed: I have independently reviewed following labs and imaging studies   CBC: Recent Labs  Lab 09/20/17 1520 09/21/17 0128 09/21/17 1156 09/22/17 0817  WBC 14.1* 10.0 12.2* 11.1*  HGB 8.1* 7.3* 8.6* 9.4*  HCT 26.8* 23.0* 27.5* 30.4*  MCV 92.7 92.0 91.7 92.7  PLT 382 352 359 893   Basic Metabolic Panel: Recent Labs  Lab 09/20/17 1520 09/21/17 0128  09/22/17 0817  NA 138 136 138  K 4.6 3.7 4.0  CL 101 102 101  CO2 26 22 27   GLUCOSE 132* 113* 120*  BUN 17 18 19   CREATININE 1.50* 1.42* 1.58*  CALCIUM 8.8* 8.3* 9.1   GFR: Estimated Creatinine Clearance: 37.2 mL/min (A) (by C-G formula based on SCr of 1.58 mg/dL (H)). Liver Function Tests: No results for input(s): AST, ALT, ALKPHOS, BILITOT, PROT, ALBUMIN in the last 168 hours. No results for input(s): LIPASE, AMYLASE in the last 168 hours. No results for input(s): AMMONIA in the last 168  hours. Coagulation Profile: No results for input(s): INR, PROTIME in the last 168 hours. Cardiac Enzymes: Recent Labs  Lab 09/21/17 0128  TROPONINI <0.03   BNP (last 3 results) No results for input(s): PROBNP in the last 8760 hours. HbA1C: No results for input(s): HGBA1C in the last 72 hours. CBG: Recent Labs  Lab 09/20/17 1526  GLUCAP 97   Lipid Profile: No results for input(s): CHOL, HDL, LDLCALC, TRIG, CHOLHDL, LDLDIRECT in the last 72 hours. Thyroid Function Tests: Recent Labs    09/21/17 0128  TSH 0.731   Anemia Panel: Recent Labs    09/21/17 0128  VITAMINB12 1,178*  FOLATE 17.6  FERRITIN 425*  TIBC 199*  IRON 18*  RETICCTPCT 3.4*   Urine analysis:    Component Value Date/Time   COLORURINE YELLOW 09/20/2017 1444   APPEARANCEUR CLEAR 09/20/2017 1444   LABSPEC 1.009 09/20/2017 1444   PHURINE 7.0 09/20/2017 1444   GLUCOSEU NEGATIVE 09/20/2017 1444   HGBUR NEGATIVE 09/20/2017 1444   BILIRUBINUR NEGATIVE 09/20/2017 1444   KETONESUR NEGATIVE 09/20/2017 1444   PROTEINUR NEGATIVE 09/20/2017 1444   UROBILINOGEN 0.2 10/20/2010 1625   NITRITE NEGATIVE 09/20/2017 1444   LEUKOCYTESUR NEGATIVE 09/20/2017 1444   Sepsis Labs: Invalid input(s): PROCALCITONIN, LACTICIDVEN  No results found for this or any previous visit (from the past 240 hour(s)).    Radiology Studies: Dg Chest 2 View  Result Date: 09/20/2017 CLINICAL DATA:  Short of breath EXAM: CHEST  2 VIEW COMPARISON:  Chest x-ray 09/20/2017, chest CT 08/13/2017 FINDINGS: Loculated left pleural fluid unchanged from prior studies. Left lower lobe airspace disease also unchanged, likely atelectasis based on the prior CT. Pneumonia not excluded Right lung clear.  Negative for heart failure IMPRESSION: No interval change in loculated left effusion and left lower lobe airspace disease. Electronically Signed   By: Franchot Gallo M.D.   On: 09/20/2017 19:47     Scheduled Meds: . amoxicillin-clavulanate  1 tablet  Oral Q12H  . docusate sodium  300 mg Oral Daily  . feeding supplement  1 Container Oral TID BM  . feeding supplement (ENSURE ENLIVE)  237 mL Oral BID BM  . finasteride  5 mg Oral Daily  . pantoprazole (PROTONIX) IV  40 mg Intravenous Q12H  . pregabalin  150 mg Oral BID  . pyridOXINE  100 mg Oral Daily  . simvastatin  40 mg Oral QHS  . tiZANidine  2 mg Oral TID  . triamterene-hydrochlorothiazide  1 tablet Oral Daily   Continuous Infusions:   Marzetta Board, MD, PhD [facility] Pager [phone]  If 7PM-7AM, please contact night-coverage [email or web address] Password [email or web address] 09/22/2017, 11:01 AM

## 2017-09-22 NOTE — Progress Notes (Signed)
[facility] Gastroenterology Progress Note  Aaron Schmidt 83 y.o. 08-Oct-1932   Subjective: Feels ok. Denies abdominal pain. Wife reports gurgling of abdomen. No BMs reported.  Objective: Vital signs: Vitals:   09/22/17 0552 09/22/17 0941  BP: 126/61 (!) 115/44  Pulse: 93   Resp: 16   Temp: 98 F (36.7 C)   SpO2: 96%     Physical Exam: Gen: elderly, lethargic, no acute distress, thin HEENT: anicteric sclera CV: RRR Chest: CTA B Abd: soft, nontender, nondistended, +BS Ext: no edema  Lab Results: Recent Labs    09/21/17 0128 09/22/17 0817  NA 136 138  K 3.7 4.0  CL 102 101  CO2 22 27  GLUCOSE 113* 120*  BUN 18 19  CREATININE 1.42* 1.58*  CALCIUM 8.3* 9.1   No results for input(s): AST, ALT, ALKPHOS, BILITOT, PROT, ALBUMIN in the last 72 hours. Recent Labs    09/21/17 1156 09/22/17 0817  WBC 12.2* 11.1*  HGB 8.6* 9.4*  HCT 27.5* 30.4*  MCV 91.7 92.7  PLT 359 393      Assessment/Plan: Symptomatic anemia - EGD tomorrow by Dr. Oletta Lamas with Propofol sedation. Hgb 9.4. Miralax prn. Liquid diet for the afternoon and NPO p MN.   Cadott C. 09/22/2017, 1:00 PM  Pager [phone]  AFTER 5 PM or on weekends please call [phone] ID: Aaron Schmidt, male   DOB: 03/13/1933, 83 y.o.   MRN: [id number]

## 2017-09-22 NOTE — Care Management Obs Status (Signed)
[facility] NOTIFICATION   Patient Details  Name: PASCO MARCHITTO MRN: [id number] Date of Birth: 11/26/1932   Medicare Observation Status Notification Given:  Yes    Carles Collet, RN 09/22/2017, 9:18 AM

## 2017-09-23 ENCOUNTER — Encounter (HOSPITAL_COMMUNITY): Payer: Self-pay | Admitting: *Deleted

## 2017-09-23 ENCOUNTER — Observation Stay (HOSPITAL_COMMUNITY): Payer: Medicare Other | Admitting: Anesthesiology

## 2017-09-23 ENCOUNTER — Encounter (HOSPITAL_COMMUNITY)
Admission: EM | Disposition: A | Discharge status: Home / Self Care | Payer: Self-pay | Source: Home / Self Care | Attending: Emergency Medicine

## 2017-09-23 DIAGNOSIS — E43 Unspecified severe protein-calorie malnutrition: Secondary | ICD-10-CM | POA: Diagnosis not present

## 2017-09-23 DIAGNOSIS — D649 Anemia, unspecified: Secondary | ICD-10-CM | POA: Diagnosis not present

## 2017-09-23 DIAGNOSIS — R0602 Shortness of breath: Secondary | ICD-10-CM | POA: Diagnosis not present

## 2017-09-23 DIAGNOSIS — R195 Other fecal abnormalities: Secondary | ICD-10-CM | POA: Diagnosis not present

## 2017-09-23 DIAGNOSIS — D500 Iron deficiency anemia secondary to blood loss (chronic): Secondary | ICD-10-CM | POA: Diagnosis not present

## 2017-09-23 HISTORY — PX: ESOPHAGOGASTRODUODENOSCOPY (EGD) WITH PROPOFOL: SHX5813

## 2017-09-23 LAB — CBC
HCT: 27.3 % — ABNORMAL LOW (ref 39.0–52.0)
Hemoglobin: 8.7 g/dL — ABNORMAL LOW (ref 13.0–17.0)
MCH: 29.2 pg (ref 26.0–34.0)
MCHC: 31.9 g/dL (ref 30.0–36.0)
MCV: 91.6 fL (ref 78.0–100.0)
Platelets: 353 10*3/uL (ref 150–400)
RBC: 2.98 MIL/uL — ABNORMAL LOW (ref 4.22–5.81)
RDW: 15.6 % — ABNORMAL HIGH (ref 11.5–15.5)
WBC: 8.9 10*3/uL (ref 4.0–10.5)

## 2017-09-23 SURGERY — ESOPHAGOGASTRODUODENOSCOPY (EGD) WITH PROPOFOL
Anesthesia: Monitor Anesthesia Care

## 2017-09-23 MED ORDER — PROPOFOL 10 MG/ML IV BOLUS
INTRAVENOUS | Status: DC | PRN
  Administered 2017-09-23 (×2): 40 mg via INTRAVENOUS

## 2017-09-23 MED ORDER — PHENYLEPHRINE HCL 10 MG/ML IJ SOLN
INTRAMUSCULAR | Status: DC | PRN
Start: 2017-09-23 — End: 2017-09-23
  Administered 2017-09-23: 120 ug via INTRAVENOUS
  Administered 2017-09-23: 80 ug via INTRAVENOUS

## 2017-09-23 MED ORDER — PANTOPRAZOLE SODIUM 40 MG PO TBEC: 40.0000 mg | DELAYED_RELEASE_TABLET | Freq: Every day | ORAL | 1 refills | Status: DC

## 2017-09-23 MED ORDER — LIDOCAINE 2% (20 MG/ML) 5 ML SYRINGE
INTRAMUSCULAR | Status: DC | PRN
Start: 2017-09-23 — End: 2017-09-23
  Administered 2017-09-23: 100 mg via INTRAVENOUS

## 2017-09-23 MED ORDER — ONDANSETRON HCL 4 MG/2ML IJ SOLN
INTRAMUSCULAR | Status: DC | PRN
  Administered 2017-09-23: 4 mg via INTRAVENOUS

## 2017-09-23 MED ORDER — PROPOFOL 500 MG/50ML IV EMUL
INTRAVENOUS | Status: DC | PRN
  Administered 2017-09-23: 100 ug/kg/min via INTRAVENOUS

## 2017-09-23 MED ORDER — LACTATED RINGERS IV SOLN
INTRAVENOUS | Status: DC | PRN
  Administered 2017-09-23: 08:00:00 via INTRAVENOUS

## 2017-09-23 SURGICAL SUPPLY — 14 items

## 2017-09-23 NOTE — Anesthesia Preprocedure Evaluation (Signed)
Anesthesia Evaluation  Patient identified by MRN, date of birth, ID band Patient awake    Reviewed: Allergy & Precautions, H&P , NPO status , Patient's Chart, lab work & pertinent test results  Airway Mallampati: II   Neck ROM: full    Dental   Pulmonary former smoker,    breath sounds clear to auscultation       Cardiovascular hypertension,  Rhythm:regular Rate:Normal     Neuro/Psych    GI/Hepatic Heme positive stool   Endo/Other    Renal/GU Renal InsufficiencyRenal disease     Musculoskeletal  (+) Arthritis ,   Abdominal   Peds  Hematology  (+) anemia ,   Anesthesia Other Findings   Reproductive/Obstetrics                             Anesthesia Physical Anesthesia Plan  ASA: III  Anesthesia Plan: MAC   Post-op Pain Management:    Induction: Intravenous  PONV Risk Score and Plan: 1 and Ondansetron, Propofol infusion and Treatment may vary due to age or medical condition  Airway Management Planned: Nasal Cannula  Additional Equipment:   Intra-op Plan:   Post-operative Plan:   Informed Consent: I have reviewed the patients History and Physical, chart, labs and discussed the procedure including the risks, benefits and alternatives for the proposed anesthesia with the patient or authorized representative who has indicated his/her understanding and acceptance.     Plan Discussed with: CRNA, Anesthesiologist and Surgeon  Anesthesia Plan Comments:         Anesthesia Quick Evaluation

## 2017-09-23 NOTE — Progress Notes (Signed)
Aaron Schmidt to be D/C'd Home per MD order.  Discussed with the patient and all questions fully answered.  VSS, Skin clean, dry and intact without evidence of skin break down, no evidence of skin tears noted. IV catheter discontinued intact. Site without signs and symptoms of complications. Dressing and pressure applied.  An After Visit Summary was printed and given to the patient. Patient received prescription.  D/c education completed with patient/family including follow up instructions, medication list, d/c activities limitations if indicated, with other d/c instructions as indicated by MD - patient able to verbalize understanding, all questions fully answered.   Patient instructed to return to ED, call 911, or call MD for any changes in condition.   Patient escorted via [place], and D/C home via private auto.  Luci Bank 09/23/2017 1:54 PM

## 2017-09-23 NOTE — Op Note (Signed)
[facility] Patient Name: Aaron Schmidt Procedure Date : 09/23/2017 MRN: [id number] Attending MD: Nancy Fetter Dr., MD Date of Birth: [birth year] CSN: [id number] Age: 83 Admit Type: Inpatient Procedure:                Upper GI endoscopy Indications:              Iron deficiency anemia secondary to chronic blood                            loss, Heme positive stool, history of ibuprophen                            use recently Providers:                Jeneen Rinks L. Edwards Dr., MD, Carolynn Comment RN, RN,                            Elspeth Cho Tech., Technician, Elmer Sow, CRNA Referring MD:              Medicines:                Monitored Anesthesia Care Complications:            No immediate complications. Estimated Blood Loss:     Estimated blood loss: none. Procedure:                Pre-Anesthesia Assessment:                           - Prior to the procedure, a History and Physical                            was performed, and patient medications and                            allergies were reviewed. The patient's tolerance of                            previous anesthesia was also reviewed. The risks                            and benefits of the procedure and the sedation                            options and risks were discussed with the patient.                            All questions were answered, and informed consent                            was obtained. Prior Anticoagulants: The patient has                            taken ibuprofen, last dose was 7 days prior to  procedure. ASA Grade Assessment: III - A patient                            with severe systemic disease. After reviewing the                            risks and benefits, the patient was deemed in                            satisfactory condition to undergo the procedure.                           After obtaining informed consent, the endoscope was               passed under direct vision. Throughout the                            procedure, the patient's blood pressure, pulse, and                            oxygen saturations were monitored continuously. The                            EG-[id number] ([id number]) scope was introduced through the                            mouth, and advanced to the second part of duodenum.                            The upper GI endoscopy was accomplished without                            difficulty. The patient tolerated the procedure                            well. Scope In: Scope Out: Findings:      The esophagus was normal.      The stomach was normal.      The examined duodenum was normal. Impression:               - Normal esophagus.                           - Normal stomach.                           - Normal examined duodenum.                           - No specimens collected.                           - Blood found in stool. No UGI source ? LGI                            bleeding from  ibuprophen. Moderate Sedation:      See anesthesia note, no moderate sedation. Recommendation:           - Patient has a contact number available for                            emergencies. The signs and symptoms of potential                            delayed complications were discussed with the                            patient. Return to normal activities tomorrow.                            Written discharge instructions were provided to the                            patient.                           - Return patient to hospital ward for ongoing care.                           - Clear liquid diet.                           - Continue present medications. Procedure Code(s):        --- Professional ---                           [phone], Esophagogastroduodenoscopy, flexible,                            transoral; diagnostic, including collection of                            specimen(s) by brushing or  washing, when performed                            (separate procedure) Diagnosis Code(s):        --- Professional ---                           D50.0, Iron deficiency anemia secondary to blood                            loss (chronic)                           R19.5, Other fecal abnormalities CPT copyright 2016 [employer]. All rights reserved. The codes documented in this report are preliminary and upon coder review may  be revised to meet current compliance requirements. Nancy Fetter Dr., MD 09/23/2017 9:01:09 AM This report has been signed electronically. Number of Addenda: 0

## 2017-09-23 NOTE — Discharge Summary (Addendum)
Physician Discharge Summary  Aaron Schmidt OEV:[id number] DOB: [birth year] DOA: 09/20/2017  PCP: Hulan Fess, MD  Admit date: 09/20/2017 Discharge date: 09/23/2017  Admitted From: home Disposition:  Home   Recommendations for Outpatient Follow-up:  1. Follow up with PCP in 1-2 weeks 2. Please repeat a CBC in 1 week  Home Health: none Equipment/Devices: none  Discharge Condition: stable CODE STATUS: Full code Diet recommendation: regular   HPI: per Dr. Levy Sjogren is a 83 y.o. male with history of hypertension, sleep apnea, hyperlipidemia who was recently admitted and discharged last week after being treated for empyema status post VATS and is on Augmentin for another 5 weeks presents to the ER because of increasing fatigue and weakness since discharge.  Denies any chest pain fever chills productive cough nausea vomiting or diarrhea.  Has not noticed any blood in the stools. ED Course: In the ER patient's hemoglobin is around 7.3.  Patient hemoglobin has remained so for during last week admission but last month it was around 11.  Stool for occult blood has been positive.  Patient denies noticing any obvious bleeding.  Patient states that he did take some NSAIDs for his chest pain prior to his previous admission.  Patient is admitted for symptomatic anemia and possible GI bleed.  EKG shows normal sinus rhythm.  Hospital Course: Discharge Diagnoses:  Principal Problem:   Symptomatic anemia Active Problems:   Essential hypertension   Anemia   Symptomatic anemia -Status post 2 units of packed red blood cells, patient feels significantly better after transfusion and his weakness has resolved.  Due to concern for GI bleed and positive FOBT, GI was consulted.  Patient underwent EGD which was unremarkable.  Discussed with Dr. Oletta Lamas, it is possible that patient may have had irritation in the setting of NSAID use several weeks ago which was now improved.  Suggested that may  discuss doing a colonoscopy in an outpatient setting.  His hemoglobin is stable, he clinically had no bleed, his weakness is completely resolved is able to ambulate in the hallway without any difficulties, he will be discharged home in stable condition Recently treated for empyemaand is on Augmentin. Hypertensionon Maxzide.  Hyperlipidemiaon statins. Sleep apneaon CPAP History of BPH on Proscar. Chronic painon narcotics. Chronic kidney disease stage III creatinine appears to be at baseline.      Discharge Instructions   Allergies as of 09/23/2017      Reactions   Diazepam Other (See Comments)   "makes goofy"      Medication List    TAKE these medications   amoxicillin-clavulanate 875-125 MG tablet Commonly known as:  AUGMENTIN Take 1 tablet by mouth every 12 (twelve) hours. What changed:  additional instructions   aspirin EC 81 MG tablet Take 81 mg by mouth daily.   b complex vitamins tablet Take 1 tablet by mouth daily.   docusate sodium 100 MG capsule Commonly known as:  COLACE Take 300 mg by mouth daily.   finasteride 5 MG tablet Commonly known as:  PROSCAR Take 5 mg by mouth daily.   Flax Seed Oil 1000 MG Caps Take 1,000 mg by mouth daily.   fluocinonide cream 0.05 % Commonly known as:  LIDEX Apply 1 application topically daily as needed (itching).   HYDROmorphone 2 MG tablet Commonly known as:  DILAUDID Take 2 mg by mouth 4 (four) times daily as needed for severe pain.   L-Lysine 500 MG Caps Take 500 mg by mouth daily.  mineral oil liquid Take 60 mLs by mouth daily at 3 pm.   multivitamin with minerals Tabs tablet Take 1 tablet by mouth daily.   nortriptyline 25 MG capsule Commonly known as:  PAMELOR Take 25 mg by mouth at bedtime.   pantoprazole 40 MG tablet Commonly known as:  PROTONIX Take 1 tablet (40 mg total) by mouth daily.   PERDIEM PO Take 3 tablets by mouth 2 (two) times daily as needed (constipation).   pregabalin 150 MG  capsule Commonly known as:  LYRICA Take 150 mg by mouth 2 (two) times daily.   PROBIOTIC PO Take 1 tablet by mouth 2 (two) times daily.   pyridOXINE 100 MG tablet Commonly known as:  VITAMIN B-6 Take 100 mg by mouth daily.   saw palmetto 80 MG capsule Take 240 mg by mouth daily.   simvastatin 40 MG tablet Commonly known as:  ZOCOR Take 40 mg by mouth at bedtime.   tiZANidine 2 MG tablet Commonly known as:  ZANAFLEX Take 2 mg by mouth 3 (three) times daily.   triamterene-hydrochlorothiazide 37.5-25 MG tablet Commonly known as:  MAXZIDE-25 Take 1-2 tablets by mouth See admin instructions. Take 1 tablet by mouth daily, increase to 2 tablets as needed for ankle swelling   vitamin C 500 MG tablet Commonly known as:  ASCORBIC ACID Take 500 mg by mouth 2 (two) times daily.   Vitamin D 2000 units Caps Take 2,000 Units by mouth daily.      Follow-up Information    Little, Lennette Bihari, MD. Schedule an appointment as soon as possible for a visit in 2 week(s).   Specialty:  Family Medicine Contact information: [place] [address] [phone]           Consultations:  GI  Procedures/Studies:  EGD  Dg Chest 2 View  Result Date: 09/20/2017 CLINICAL DATA:  Short of breath EXAM: CHEST  2 VIEW COMPARISON:  Chest x-ray 09/20/2017, chest CT 08/13/2017 FINDINGS: Loculated left pleural fluid unchanged from prior studies. Left lower lobe airspace disease also unchanged, likely atelectasis based on the prior CT. Pneumonia not excluded Right lung clear.  Negative for heart failure IMPRESSION: No interval change in loculated left effusion and left lower lobe airspace disease. Electronically Signed   By: Franchot Gallo M.D.   On: 09/20/2017 19:47   Dg Chest 2 View  Result Date: 09/14/2017 CLINICAL DATA:  S/p VATS 09/09/17; hx of HTN; former pipe smoker EXAM: CHEST  2 VIEW COMPARISON:  09/13/2017 FINDINGS: There has been further improvement in left lung aeration.  A portion of the left lung base is no aerated. Right lung remains clear. No pneumothorax. IMPRESSION: 1. Mild improvement in left lung aeration since the previous day's study. No other change. No pneumothorax. Electronically Signed   By: Lajean Manes M.D.   On: 09/14/2017 08:54   Dg Chest 2 View  Result Date: 09/09/2017 CLINICAL DATA:  83 year old male with increasing shortness of breath EXAM: CHEST  2 VIEW COMPARISON:  09/04/2017 and prior chest radiographs FINDINGS: Increasing left pleural effusion and left lung atelectasis/airspace disease noted. There is no evidence of pneumothorax. A small right pleural effusion is present. Minimal right basilar atelectasis identified. No acute bony abnormalities are identified. IMPRESSION: 1. Increasing left pleural effusion and left lung atelectasis/ airspace disease. 2. Small right pleural effusion and minimal right basilar atelectasis. Electronically Signed   By: Margarette Canada M.D.   On: 09/09/2017 09:32   Dg Chest 2 View  Result  Date: 09/04/2017 CLINICAL DATA:  Shortness of breath. EXAM: CHEST  2 VIEW COMPARISON:  Radiograph of August 20, 2017. FINDINGS: Stable cardiomediastinal silhouette. No pneumothorax is noted. Right lung is clear. Stable moderate size loculated left pleural effusion is noted with probable associated left basilar atelectasis or infiltrate. Bony thorax is unremarkable. IMPRESSION: Stable moderate-sized loculated left pleural effusion. Electronically Signed   By: Marijo Conception, M.D.   On: 09/04/2017 14:25   Dg Chest Port 1 View  Result Date: 09/13/2017 CLINICAL DATA:  S/P chest tube removal from left side of chest. Pt c/o chest soreness on left side. No other complaints. EXAM: PORTABLE CHEST 1 VIEW COMPARISON:  09/12/2017 at 5:28 a.m. FINDINGS: No convincing pneumothorax following left chest tube removal. There is scarring at the left apex. Pleural-based opacities noted along the lateral left hemithorax consistent with loculated fluid.  There is opacity at the left lung base which is likely combination of pleural fluid and atelectasis. Right lung is hyperexpanded but clear. IMPRESSION: 1. No convincing pneumothorax following left chest tube removal. Electronically Signed   By: Lajean Manes M.D.   On: 09/13/2017 12:26   Dg Chest Port 1 View  Result Date: 09/12/2017 CLINICAL DATA:  Followup chest tubes for empyema. EXAM: PORTABLE CHEST 1 VIEW COMPARISON:  09/11/2017 FINDINGS: One left chest tube is been removed. Two left chest tubes remain in place. Small amount of pleural air remains evident at the apex. Persistent pleural density and partial atelectasis of the left lung. Right chest remains clear. Central line unchanged. IMPRESSION: One of the left chest tubes has been removed. Small amount of pleural air remains at the apex. Similar appearance of persistent pleural density and volume loss in the left lung. Electronically Signed   By: Nelson Chimes M.D.   On: 09/12/2017 07:25   Dg Chest Port 1 View  Result Date: 09/11/2017 CLINICAL DATA:  Followup chest tube.  Empyema. EXAM: PORTABLE CHEST 1 VIEW COMPARISON:  09/10/2017 FINDINGS: Multiple left chest tubes remain in place. Left internal jugular central line remains with tip at the azygos level. Right chest remains clear. Persistent pleural and parenchymal density on the left, with slightly less pleural density. Possible bulb bowl of pleural air at the left apex. No significant pneumothorax. IMPRESSION: Less pleural density on the left. Question bubble of air at the left pleural apex. No significant pneumothorax. Electronically Signed   By: Nelson Chimes M.D.   On: 09/11/2017 08:44   Dg Chest Port 1 View  Result Date: 09/10/2017 CLINICAL DATA:  Chest tube.  Sore chest.  Shortness of breath . EXAM: PORTABLE CHEST 1 VIEW COMPARISON:  09/09/2017. FINDINGS: Left IJ line, left chest tubes in stable position. Heart size stable. Bilateral pulmonary infiltrates and left-sided pleural effusion.  No pneumothorax. Cervical spine fusion. IMPRESSION: 1. Left IJ line and left chest tubes in stable position. No pneumothorax. 2.  Bibasilar pulmonary infiltrates and bilateral pleural effusions. 3. Stable cardiomegaly. Electronically Signed   By: Marcello Moores  Register   On: 09/10/2017 09:48   Dg Chest Port 1 View  Result Date: 09/09/2017 CLINICAL DATA:  Empyema of lung.  Chest tube, central line. EXAM: PORTABLE CHEST 1 VIEW COMPARISON:  Chest x-ray from earlier same day, chest x-ray dated 09/04/2017. FINDINGS: Interval placement of 2 left-sided chest tubes, 1 positioned at the left lung base and the other directed towards the left lung apex. Significantly decreased opacity within the left lung, presumably status post evacuation of the empyema. Small right pleural effusion and/or atelectasis.  Left IJ central line in place with tip at the level of the upper SVC. Heart size and mediastinal contours are grossly stable. Atherosclerotic changes noted at the aortic arch. IMPRESSION: 1. Interval placement of 2 left-sided chest tubes. Significantly decreased opacity within the left lung, presumably related to evacuation of the left lung empyema. 2. Small right pleural effusion and/or atelectasis. 3. Left IJ central line placement with tip at the level of the upper SVC. Would consider advancing towards the lower SVC/cavoatrial junction for optimal radiographic positioning. 4. Aortic atherosclerosis. Electronically Signed   By: Franki Cabot M.D.   On: 09/09/2017 20:38      Subjective: - no chest pain, shortness of breath, no abdominal pain, nausea or vomiting.   Discharge Exam: Vitals:   09/23/17 0900 09/23/17 0929  BP: (!) 118/58 133/66  Pulse: 84 87  Resp: 15 16  Temp:  98.5 F (36.9 C)  SpO2: 97% 100%    General: Pt is alert, awake, not in acute distress Cardiovascular: RRR, S1/S2 +, no rubs, no gallops Respiratory: CTA bilaterally, no wheezing, no rhonchi Abdominal: Soft, NT, ND, bowel sounds  + Extremities: no edema, no cyanosis    The results of significant diagnostics from this hospitalization (including imaging, microbiology, ancillary and laboratory) are listed below for reference.     Microbiology: No results found for this or any previous visit (from the past 240 hour(s)).   Labs: BNP (last 3 results) Recent Labs    08/13/17 1043  BNP 94.8   Basic Metabolic Panel: Recent Labs  Lab 09/20/17 1520 09/21/17 0128 09/22/17 0817  NA 138 136 138  K 4.6 3.7 4.0  CL 101 102 101  CO2 26 22 27   GLUCOSE 132* 113* 120*  BUN 17 18 19   CREATININE 1.50* 1.42* 1.58*  CALCIUM 8.8* 8.3* 9.1   Liver Function Tests: No results for input(s): AST, ALT, ALKPHOS, BILITOT, PROT, ALBUMIN in the last 168 hours. No results for input(s): LIPASE, AMYLASE in the last 168 hours. No results for input(s): AMMONIA in the last 168 hours. CBC: Recent Labs  Lab 09/20/17 1520 09/21/17 0128 09/21/17 1156 09/22/17 0817 09/23/17 0242  WBC 14.1* 10.0 12.2* 11.1* 8.9  HGB 8.1* 7.3* 8.6* 9.4* 8.7*  HCT 26.8* 23.0* 27.5* 30.4* 27.3*  MCV 92.7 92.0 91.7 92.7 91.6  PLT 382 352 359 393 353   Cardiac Enzymes: Recent Labs  Lab 09/21/17 0128  TROPONINI <0.03   BNP: Invalid input(s): POCBNP CBG: Recent Labs  Lab 09/20/17 1526  GLUCAP 97   D-Dimer No results for input(s): DDIMER in the last 72 hours. Hgb A1c No results for input(s): HGBA1C in the last 72 hours. Lipid Profile No results for input(s): CHOL, HDL, LDLCALC, TRIG, CHOLHDL, LDLDIRECT in the last 72 hours. Thyroid function studies Recent Labs    09/21/17 0128  TSH 0.731   Anemia work up Recent Labs    09/21/17 0128  VITAMINB12 1,178*  FOLATE 17.6  FERRITIN 425*  TIBC 199*  IRON 18*  RETICCTPCT 3.4*   Urinalysis    Component Value Date/Time   COLORURINE YELLOW 09/20/2017 [place] 09/20/2017 1444   LABSPEC 1.009 09/20/2017 1444   PHURINE 7.0 09/20/2017 1444   GLUCOSEU NEGATIVE  09/20/2017 1444   HGBUR NEGATIVE 09/20/2017 1444   BILIRUBINUR NEGATIVE 09/20/2017 1444   KETONESUR NEGATIVE 09/20/2017 1444   PROTEINUR NEGATIVE 09/20/2017 1444   UROBILINOGEN 0.2 10/20/2010 1625   NITRITE NEGATIVE 09/20/2017 1444   LEUKOCYTESUR NEGATIVE 09/20/2017 1444  Sepsis Labs Invalid input(s): PROCALCITONIN,  WBC,  LACTICIDVEN   Time coordinating discharge: 35 minutes  SIGNED:  Marzetta Board, MD  [facility] 09/23/2017, 1:50 PM Pager [phone]  If 7PM-7AM, please contact night-coverage [email or web address] Password [email or web address]

## 2017-09-23 NOTE — Discharge Instructions (Signed)
Follow with Aaron Fess, MD in 1-2 weeks Avoid all NSAIDs  Please get a complete blood count and chemistry panel checked by your Primary MD at your next visit, and again as instructed by your Primary MD. Please get your medications reviewed and adjusted by your Primary MD.  Please request your Primary MD to go over all Hospital Tests and Procedure/Radiological results at the follow up, please get all Hospital records sent to your Prim MD by signing hospital release before you go home.  If you had Pneumonia of Lung problems at the Hospital: Please get a 2 view Chest X ray done in 6-8 weeks after hospital discharge or sooner if instructed by your Primary MD.  If you have Congestive Heart Failure: Please call your Cardiologist or Primary MD anytime you have any of the following symptoms:  1) 3 pound weight gain in 24 hours or 5 pounds in 1 week  2) shortness of breath, with or without a dry hacking cough  3) swelling in the hands, feet or stomach  4) if you have to sleep on extra pillows at night in order to breathe  Follow cardiac low salt diet and 1.5 lit/day fluid restriction.  If you have diabetes Accuchecks 4 times/day, Once in AM empty stomach and then before each meal. Log in all results and show them to your primary doctor at your next visit. If any glucose reading is under 80 or above 300 call your primary MD immediately.  If you have Seizure/Convulsions/Epilepsy: Please do not drive, operate heavy machinery, participate in activities at heights or participate in high speed sports until you have seen by Primary MD or a Neurologist and advised to do so again.  If you had Gastrointestinal Bleeding: Please ask your Primary MD to check a complete blood count within one week of discharge or at your next visit. Your endoscopic/colonoscopic biopsies that are pending at the time of discharge, will also need to followed by your Primary MD.  Get Medicines reviewed and adjusted. Please  take all your medications with you for your next visit with your Primary MD  Please request your Primary MD to go over all hospital tests and procedure/radiological results at the follow up, please ask your Primary MD to get all Hospital records sent to his/her office.  If you experience worsening of your admission symptoms, develop shortness of breath, life threatening emergency, suicidal or homicidal thoughts you must seek medical attention immediately by calling 911 or calling your MD immediately  if symptoms less severe.  You must read complete instructions/literature along with all the possible adverse reactions/side effects for all the Medicines you take and that have been prescribed to you. Take any new Medicines after you have completely understood and accpet all the possible adverse reactions/side effects.   Do not drive or operate heavy machinery when taking Pain medications.   Do not take more than prescribed Pain, Sleep and Anxiety Medications  Special Instructions: If you have smoked or chewed Tobacco  in the last 2 yrs please stop smoking, stop any regular Alcohol  and or any Recreational drug use.  Wear Seat belts while driving.  Please note You were cared for by a hospitalist during your hospital stay. If you have any questions about your discharge medications or the care you received while you were in the hospital after you are discharged, you can call the unit and asked to speak with the hospitalist on call if the hospitalist that took care of you is  not available. Once you are discharged, your primary care physician will handle any further medical issues. Please note that NO REFILLS for any discharge medications will be authorized once you are discharged, as it is imperative that you return to your primary care physician (or establish a relationship with a primary care physician if you do not have one) for your aftercare needs so that they can reassess your need for medications  and monitor your lab values.  You can reach the hospitalist office at phone [phone] or fax [phone]   If you do not have a primary care physician, you can call [phone] for a physician referral.  Activity: As tolerated with Full fall precautions use walker/cane & assistance as needed  Diet: regular  Disposition Home

## 2017-09-23 NOTE — Interval H&P Note (Signed)
History and Physical Interval Note:  09/23/2017 8:13 AM  Aaron Schmidt  has presented today for surgery, with the diagnosis of Anemia; Heme positive stool  The various methods of treatment have been discussed with the patient and family. After consideration of risks, benefits and other options for treatment, the patient has consented to  Procedure(s): ESOPHAGOGASTRODUODENOSCOPY (EGD) WITH PROPOFOL (N/A) as a surgical intervention .  The patient's history has been reviewed, patient examined, no change in status, stable for surgery.  I have reviewed the patient's chart and labs.  Questions were answered to the patient's satisfaction.     Nancy Fetter

## 2017-09-23 NOTE — Care Management Note (Signed)
Case Management Note  Patient Details  Name: Aaron Schmidt MRN: [id number] Date of Birth: 11-01-1932  Subjective/Objective:                    Action/Plan: Pt discharging home with his spouse. Pt has PCP, insurance and transportation home. No further needs per CM.  Expected Discharge Date:  09/23/17               Expected Discharge Plan:  Home/Self Care  In-House Referral:     Discharge planning Services     Post Acute Care Choice:    Choice offered to:     DME Arranged:    DME Agency:     HH Arranged:    HH Agency:     Status of Service:  Completed, signed off  If discussed at [employer] of Stay Meetings, dates discussed:    Additional Comments:  Pollie Friar, RN 09/23/2017, 12:18 PM

## 2017-09-23 NOTE — Transfer of Care (Signed)
Immediate Anesthesia Transfer of Care Note  Patient: Aaron Schmidt  Procedure(s) Performed: ESOPHAGOGASTRODUODENOSCOPY (EGD) WITH PROPOFOL (N/A )  Patient Location: Endoscopy Unit  Anesthesia Type:MAC  Level of Consciousness: drowsy  Airway & Oxygen Therapy: Patient Spontanous Breathing and Patient connected to nasal cannula oxygen  Post-op Assessment: Report given to RN and Post -op Vital signs reviewed and stable  Post vital signs: Reviewed and stable  Last Vitals:  Vitals:   09/23/17 0801 09/23/17 0851  BP: (!) 143/59 (!) 119/59  Pulse: 90 82  Resp: 13 15  Temp: 36.9 C 36.6 C  SpO2: 98% 100%    Last Pain:  Vitals:   09/23/17 0851  TempSrc: Oral  PainSc:       Patients Stated Pain Goal: 4 (94/17/40 8144)  Complications: No apparent anesthesia complications

## 2017-09-23 NOTE — Progress Notes (Signed)
GI Discharge/Sign-Off Note  Subjective: EGD negative Hg stable no active bleeding   Principal Problem:   Symptomatic anemia Active Problems:   Essential hypertension   Anemia Empyema  Results for orders placed or performed during the hospital encounter of 09/20/17 (from the past 72 hour(s))  Urinalysis, Routine w reflex microscopic     Status: None   Collection Time: 09/20/17  2:44 PM  Result Value Ref Range   Color, Urine YELLOW YELLOW   APPearance CLEAR CLEAR   Specific Gravity, Urine 1.009 1.005 - 1.030   pH 7.0 5.0 - 8.0   Glucose, UA NEGATIVE NEGATIVE mg/dL   Hgb urine dipstick NEGATIVE NEGATIVE   Bilirubin Urine NEGATIVE NEGATIVE   Ketones, ur NEGATIVE NEGATIVE mg/dL   Protein, ur NEGATIVE NEGATIVE mg/dL   Nitrite NEGATIVE NEGATIVE   Leukocytes, UA NEGATIVE NEGATIVE  Basic metabolic panel     Status: Abnormal   Collection Time: 09/20/17  3:20 PM  Result Value Ref Range   Sodium 138 135 - 145 mmol/L   Potassium 4.6 3.5 - 5.1 mmol/L   Chloride 101 101 - 111 mmol/L   CO2 26 22 - 32 mmol/L   Glucose, Bld 132 (H) 65 - 99 mg/dL   BUN 17 6 - 20 mg/dL   Creatinine, Ser 1.50 (H) 0.61 - 1.24 mg/dL   Calcium 8.8 (L) 8.9 - 10.3 mg/dL   GFR calc non Af Amer 42 (L) >60 mL/min   GFR calc Af Amer 48 (L) >60 mL/min    Comment: (NOTE) The eGFR has been calculated using the CKD EPI equation. This calculation has not been validated in all clinical situations. eGFR's persistently <60 mL/min signify possible Chronic Kidney Disease.    Anion gap 11 5 - 15  CBC     Status: Abnormal   Collection Time: 09/20/17  3:20 PM  Result Value Ref Range   WBC 14.1 (H) 4.0 - 10.5 K/uL   RBC 2.89 (L) 4.22 - 5.81 MIL/uL   Hemoglobin 8.1 (L) 13.0 - 17.0 g/dL   HCT 26.8 (L) 39.0 - 52.0 %   MCV 92.7 78.0 - 100.0 fL   MCH 28.0 26.0 - 34.0 pg   MCHC 30.2 30.0 - 36.0 g/dL   RDW 15.9 (H) 11.5 - 15.5 %   Platelets 382 150 - 400 K/uL  CBG monitoring, ED     Status: None   Collection Time: 09/20/17   3:26 PM  Result Value Ref Range   Glucose-Capillary 97 65 - 99 mg/dL   Comment 1 Notify RN    Comment 2 Document in Chart   POC occult blood, ED Provider will collect     Status: Abnormal   Collection Time: 09/20/17  8:48 PM  Result Value Ref Range   Fecal Occult Bld POSITIVE (A) NEGATIVE  Type and screen [place]     Status: None   Collection Time: 09/20/17  9:35 PM  Result Value Ref Range   ABO/RH(D) A POS    Antibody Screen NEG    Sample Expiration 09/23/2017    Unit Number [id number]    Blood Component Type RED CELLS,LR    Unit division 00    Status of Unit ISSUED,FINAL    Transfusion Status OK TO TRANSFUSE    Crossmatch Result Compatible   Prepare RBC     Status: None   Collection Time: 09/21/17 12:17 AM  Result Value Ref Range   Order Confirmation ORDER PROCESSED BY BLOOD BANK   Basic  metabolic panel     Status: Abnormal   Collection Time: 09/21/17  1:28 AM  Result Value Ref Range   Sodium 136 135 - 145 mmol/L   Potassium 3.7 3.5 - 5.1 mmol/L    Comment: DELTA CHECK NOTED   Chloride 102 101 - 111 mmol/L   CO2 22 22 - 32 mmol/L   Glucose, Bld 113 (H) 65 - 99 mg/dL   BUN 18 6 - 20 mg/dL   Creatinine, Ser 1.42 (H) 0.61 - 1.24 mg/dL   Calcium 8.3 (L) 8.9 - 10.3 mg/dL   GFR calc non Af Amer 44 (L) >60 mL/min   GFR calc Af Amer 52 (L) >60 mL/min    Comment: (NOTE) The eGFR has been calculated using the CKD EPI equation. This calculation has not been validated in all clinical situations. eGFR's persistently <60 mL/min signify possible Chronic Kidney Disease.    Anion gap 12 5 - 15  CBC     Status: Abnormal   Collection Time: 09/21/17  1:28 AM  Result Value Ref Range   WBC 10.0 4.0 - 10.5 K/uL   RBC 2.50 (L) 4.22 - 5.81 MIL/uL   Hemoglobin 7.3 (L) 13.0 - 17.0 g/dL   HCT 23.0 (L) 39.0 - 52.0 %   MCV 92.0 78.0 - 100.0 fL   MCH 29.2 26.0 - 34.0 pg   MCHC 31.7 30.0 - 36.0 g/dL   RDW 15.9 (H) 11.5 - 15.5 %   Platelets 352 150 - 400 K/uL  TSH      Status: None   Collection Time: 09/21/17  1:28 AM  Result Value Ref Range   TSH 0.731 0.350 - 4.500 uIU/mL    Comment: Performed by a 3rd Generation assay with a functional sensitivity of <=0.01 uIU/mL.  Troponin I     Status: None   Collection Time: 09/21/17  1:28 AM  Result Value Ref Range   Troponin I <0.03 <0.03 ng/mL  Vitamin B12     Status: Abnormal   Collection Time: 09/21/17  1:28 AM  Result Value Ref Range   Vitamin B-12 1,178 (H) 180 - 914 pg/mL    Comment: (NOTE) This assay is not validated for testing neonatal or myeloproliferative syndrome specimens for Vitamin B12 levels.   Folate     Status: None   Collection Time: 09/21/17  1:28 AM  Result Value Ref Range   Folate 17.6 >5.9 ng/mL  Iron and TIBC     Status: Abnormal   Collection Time: 09/21/17  1:28 AM  Result Value Ref Range   Iron 18 (L) 45 - 182 ug/dL   TIBC 199 (L) 250 - 450 ug/dL   Saturation Ratios 9 (L) 17.9 - 39.5 %   UIBC 181 ug/dL  Ferritin     Status: Abnormal   Collection Time: 09/21/17  1:28 AM  Result Value Ref Range   Ferritin 425 (H) 24 - 336 ng/mL  Reticulocytes     Status: Abnormal   Collection Time: 09/21/17  1:28 AM  Result Value Ref Range   Retic Ct Pct 3.4 (H) 0.4 - 3.1 %   RBC. 2.50 (L) 4.22 - 5.81 MIL/uL   Retic Count, Absolute 85.0 19.0 - 186.0 K/uL  CBC     Status: Abnormal   Collection Time: 09/21/17 11:56 AM  Result Value Ref Range   WBC 12.2 (H) 4.0 - 10.5 K/uL   RBC 3.00 (L) 4.22 - 5.81 MIL/uL   Hemoglobin 8.6 (L) 13.0 - 17.0 g/dL     HCT 27.5 (L) 39.0 - 52.0 %   MCV 91.7 78.0 - 100.0 fL   MCH 28.7 26.0 - 34.0 pg   MCHC 31.3 30.0 - 36.0 g/dL   RDW 15.5 11.5 - 15.5 %   Platelets 359 150 - 400 K/uL  CBC     Status: Abnormal   Collection Time: 09/22/17  8:17 AM  Result Value Ref Range   WBC 11.1 (H) 4.0 - 10.5 K/uL   RBC 3.28 (L) 4.22 - 5.81 MIL/uL   Hemoglobin 9.4 (L) 13.0 - 17.0 g/dL   HCT 30.4 (L) 39.0 - 52.0 %   MCV 92.7 78.0 - 100.0 fL   MCH 28.7 26.0 - 34.0 pg    MCHC 30.9 30.0 - 36.0 g/dL   RDW 15.7 (H) 11.5 - 15.5 %   Platelets 393 150 - 400 K/uL  Basic metabolic panel     Status: Abnormal   Collection Time: 09/22/17  8:17 AM  Result Value Ref Range   Sodium 138 135 - 145 mmol/L   Potassium 4.0 3.5 - 5.1 mmol/L   Chloride 101 101 - 111 mmol/L   CO2 27 22 - 32 mmol/L   Glucose, Bld 120 (H) 65 - 99 mg/dL   BUN 19 6 - 20 mg/dL   Creatinine, Ser 1.58 (H) 0.61 - 1.24 mg/dL   Calcium 9.1 8.9 - 10.3 mg/dL   GFR calc non Af Amer 39 (L) >60 mL/min   GFR calc Af Amer 45 (L) >60 mL/min    Comment: (NOTE) The eGFR has been calculated using the CKD EPI equation. This calculation has not been validated in all clinical situations. eGFR's persistently <60 mL/min signify possible Chronic Kidney Disease.    Anion gap 10 5 - 15  CBC     Status: Abnormal   Collection Time: 09/23/17  2:42 AM  Result Value Ref Range   WBC 8.9 4.0 - 10.5 K/uL   RBC 2.98 (L) 4.22 - 5.81 MIL/uL   Hemoglobin 8.7 (L) 13.0 - 17.0 g/dL   HCT 27.3 (L) 39.0 - 52.0 %   MCV 91.6 78.0 - 100.0 fL   MCH 29.2 26.0 - 34.0 pg   MCHC 31.9 30.0 - 36.0 g/dL   RDW 15.6 (H) 11.5 - 15.5 %   Platelets 353 150 - 400 K/uL    No results found.  @[email or web address]@  GI DISCHARGE PLANNING:  Diet:  Regular  Anticoagulation/Antiplatelet Rx Suggestions: None  GI Medications: PPI of choice  Labs/Procedures Ordered:  GI FOLLOW UP:  Call [phone] to make appointment.   Doctor: Outlaw  Time:  1 month ? Colon if stool still positive   James Edwards, MD   Pager [phone] If no answer or after hours call [phone] 

## 2017-09-24 ENCOUNTER — Encounter (HOSPITAL_COMMUNITY): Payer: Self-pay | Admitting: Gastroenterology

## 2017-09-25 NOTE — Anesthesia Postprocedure Evaluation (Signed)
Anesthesia Post Note  Patient: Aaron Schmidt  Procedure(s) Performed: ESOPHAGOGASTRODUODENOSCOPY (EGD) WITH PROPOFOL (N/A )     Patient location during evaluation: PACU Anesthesia Type: MAC Level of consciousness: awake and alert Pain management: pain level controlled Vital Signs Assessment: post-procedure vital signs reviewed and stable Respiratory status: spontaneous breathing, nonlabored ventilation, respiratory function stable and patient connected to nasal cannula oxygen Cardiovascular status: stable and blood pressure returned to baseline Postop Assessment: no apparent nausea or vomiting Anesthetic complications: no    Last Vitals:  Vitals:   09/23/17 0900 09/23/17 0929  BP: (!) 118/58 133/66  Pulse: 84 87  Resp: 15 16  Temp:  36.9 C  SpO2: 97% 100%    Last Pain:  Vitals:   09/23/17 0929  TempSrc: Oral  PainSc:                  Lyons S

## 2017-09-30 LAB — CULTURE, FUNGUS WITHOUT SMEAR

## 2017-10-07 ENCOUNTER — Other Ambulatory Visit: Payer: Self-pay | Admitting: Thoracic Surgery (Cardiothoracic Vascular Surgery)

## 2017-10-07 DIAGNOSIS — J869 Pyothorax without fistula: Secondary | ICD-10-CM

## 2017-10-08 ENCOUNTER — Ambulatory Visit
Admission: RE | Admit: 2017-10-08 | Discharge: 2017-10-08 | Disposition: A | Discharge status: Home / Self Care | Payer: Medicare Other | Source: Ambulatory Visit | Attending: Thoracic Surgery (Cardiothoracic Vascular Surgery) | Admitting: Thoracic Surgery (Cardiothoracic Vascular Surgery)

## 2017-10-08 ENCOUNTER — Encounter: Payer: Self-pay | Admitting: Thoracic Surgery (Cardiothoracic Vascular Surgery)

## 2017-10-08 ENCOUNTER — Ambulatory Visit (INDEPENDENT_AMBULATORY_CARE_PROVIDER_SITE_OTHER): Payer: Self-pay | Admitting: Thoracic Surgery (Cardiothoracic Vascular Surgery)

## 2017-10-08 ENCOUNTER — Other Ambulatory Visit: Payer: Self-pay

## 2017-10-08 VITALS — BP 120/74 | HR 85 | Resp 18 | Ht 70.0 in | Wt 169.6 lb

## 2017-10-08 DIAGNOSIS — J90 Pleural effusion, not elsewhere classified: Secondary | ICD-10-CM

## 2017-10-08 DIAGNOSIS — J869 Pyothorax without fistula: Secondary | ICD-10-CM

## 2017-10-08 NOTE — Addendum Note (Signed)
Addended by: Charlena Cross F on: 10/08/2017 03:34 PM   Modules accepted: Orders

## 2017-10-08 NOTE — Progress Notes (Signed)
[address]       [place] [address]             [phone]     HPI: [name] returns for a scheduled postoperative follow-up visit  [name] is an 84 year old man with a history of chronic narcotic dependent back pain and multiple spinal surgeries, hypertension, BPH, and sleep apnea.  He developed shortness of breath and pleuritic left-sided chest pain back in November.  He was scheduled for left VATS on 09/04/2017.  He canceled due to weather concerns but ended up being admitted through the emergency room later that evening.  I did a left VATS and decortication on 09/09/2017.  Pathology showed only inflammation.  Cultures were negative.  Bronchoscopy did have extrinsic compression of his lower lobe airways but no endobronchial mass lesions were seen.  Postoperatively he did well and went home on day 5.  However, he was readmitted about a week later with anemia and heme positive stools.  He required blood transfusions.  Endoscopy revealed no upper GI source.  He complains of feeling weak and tired.  He does not have shortness of breath.  He is not having any incisional pain.  He does still suffer from chronic back pain.  He has 2 more weeks to go on his antibiotics.  Past Medical History:  Diagnosis Date  . Arthritis    Degeneration spine & stenosis  . History of kidney stones   . Hypertension    has a histroy of  . Hypotension   . Sleep apnea 2012   used CPAP 2 yrs. ago, feels he sleeps better w/o, no longer using   '  Current Outpatient Medications  Medication Sig Dispense Refill  . amoxicillin-clavulanate (AUGMENTIN) 875-125 MG tablet Take 1 tablet by mouth every 12 (twelve) hours. (Patient taking differently: Take 1 tablet by mouth every 12 (twelve) hours. 5 week course (70 doses) started 09/15/17) 70 tablet 0  . b complex vitamins tablet Take 1 tablet by mouth daily.      . Cholecalciferol (VITAMIN D) 2000 UNITS CAPS Take 2,000 Units by mouth daily.      Marland Kitchen docusate sodium (COLACE) 100 MG capsule Take 300 mg by mouth daily.     . finasteride (PROSCAR) 5 MG tablet Take 5 mg by mouth daily.    . Flaxseed, Linseed, (FLAX SEED OIL) 1000 MG CAPS Take 1,000 mg by mouth daily.     . fluocinonide cream (LIDEX) 1.19 % Apply 1 application topically daily as needed (itching).    Marland Kitchen HYDROmorphone (DILAUDID) 2 MG tablet Take 2 mg by mouth 4 (four) times daily as needed for severe pain.    Marland Kitchen L-Lysine 500 MG CAPS Take 500 mg by mouth daily.     . mineral oil liquid Take 60 mLs by mouth daily at 3 pm.    . Multiple Vitamin (MULTIVITAMIN WITH MINERALS) TABS tablet Take 1 tablet by mouth daily.    . nortriptyline (PAMELOR) 25 MG capsule Take 25 mg by mouth at bedtime.     . pantoprazole (PROTONIX) 40 MG tablet Take 1 tablet (40 mg total) by mouth daily. 30 tablet 1  . pregabalin (LYRICA) 150 MG capsule Take 150 mg by mouth 2 (two) times daily.    . Probiotic Product (PROBIOTIC PO) Take 1 tablet by mouth 2 (two) times daily.    Marland Kitchen pyridOXINE (VITAMIN B-6) 100 MG tablet Take 100 mg by mouth daily.      Marland Kitchen  saw palmetto 80 MG capsule Take 240 mg by mouth daily.     . Senna-Psyllium (PERDIEM PO) Take 3 tablets by mouth 2 (two) times daily as needed (constipation).     . simvastatin (ZOCOR) 40 MG tablet Take 40 mg by mouth at bedtime.     Marland Kitchen tiZANidine (ZANAFLEX) 2 MG tablet Take 2 mg by mouth 3 (three) times daily.      No current facility-administered medications for this visit.     Physical Exam BP 120/74 (BP Location: Left Arm, Patient Position: Sitting, Cuff Size: Normal)   Pulse 85   Resp 18   Ht 5\' 10"  (1.778 m)   Wt 169 lb 9.6 oz (76.9 kg)   SpO2 92% Comment: RA  BMI 24.30 kg/m  84 year old man in no acute distress  Alert and oriented x3 generally weak but no focal weakness Diminished breath sounds at left base, otherwise clear Incisions well-healed No peripheral edema   Diagnostic Tests: CHEST  2 VIEW  COMPARISON:   10/02/2017.  FINDINGS: Mediastinum and hilar structures normal. Postsurgical changes left lung with left-sided pleuroparenchymal thickening consistent with scarring. Right lung is clear. Cardiomegaly with normal pulmonary vascularity. Degenerative changes thoracic spine. Prior cervical spine fusion.  IMPRESSION: Postsurgical changes left lung with left-sided pleuroparenchymal thickening consistent with scarring. No acute abnormality.   Electronically Signed   By: Marcello Moores  Register   On: 10/08/2017 14:19 I personally reviewed the chest x-ray concur with the findings noted above.  Impression: [name] is an 84 year old man who recently had a left VATS for drainage of empyema and decortication.  Empyema-chest x-ray shows an excellent post decortication result.  Wife says they were told there was residual fluid.  He does have some pleural thickening.  Overall this is about as favorable a chest x-ray appearances I have seen in the early post decortication.  He did have extrinsic compression of the left lower lobe airways on bronchoscopy.  This may have all been due to local adenopathy related to the infection, but I do think he needs a repeat CT in a few months after the infection and operative changes have resolved to make sure that there is no underlying lung mass.  Chronic back pain-narcotic dependent.  Iron deficiency anemia-he received 2 units of blood when he was readmitted with anemia.  Most recent hemoglobin was 9.  He is on iron.  No upper GI source was identified.  Deconditioning-severe.  Apparently home physical therapy was arranged prior to his discharge after surgery, but then when he was readmitted but that all fell apart.  I am going to refer him to our pulmonary rehab program to see that will help him recover.  Plan: Return in 3 months with CT chest  [name], MD [facility] [phone]

## 2017-10-13 ENCOUNTER — Emergency Department (HOSPITAL_BASED_OUTPATIENT_CLINIC_OR_DEPARTMENT_OTHER)
Admission: EM | Admit: 2017-10-13 | Discharge: 2017-10-13 | Disposition: A | Discharge status: Home / Self Care | Payer: Medicare Other | Attending: Emergency Medicine | Admitting: Emergency Medicine

## 2017-10-13 ENCOUNTER — Other Ambulatory Visit: Payer: Self-pay

## 2017-10-13 ENCOUNTER — Encounter (HOSPITAL_BASED_OUTPATIENT_CLINIC_OR_DEPARTMENT_OTHER): Payer: Self-pay | Admitting: Emergency Medicine

## 2017-10-13 DIAGNOSIS — I10 Essential (primary) hypertension: Secondary | ICD-10-CM | POA: Diagnosis not present

## 2017-10-13 DIAGNOSIS — K5641 Fecal impaction: Secondary | ICD-10-CM

## 2017-10-13 DIAGNOSIS — K5900 Constipation, unspecified: Secondary | ICD-10-CM | POA: Diagnosis present

## 2017-10-13 DIAGNOSIS — Z79899 Other long term (current) drug therapy: Secondary | ICD-10-CM | POA: Diagnosis not present

## 2017-10-13 DIAGNOSIS — Z87891 Personal history of nicotine dependence: Secondary | ICD-10-CM | POA: Diagnosis not present

## 2017-10-13 MED ORDER — MINERAL OIL RE ENEM
1.0000 | ENEMA | Freq: Once | RECTAL | Status: AC
  Administered 2017-10-13: 1 via RECTAL
  Filled 2017-10-13: qty 1

## 2017-10-13 MED ORDER — MAGNESIUM CITRATE PO SOLN: ORAL | 0 refills | Status: DC

## 2017-10-13 MED ORDER — POLYETHYLENE GLYCOL 3350 17 G PO PACK: 17.0000 g | PACK | Freq: Every day | ORAL | 0 refills | Status: DC

## 2017-10-13 MED ORDER — LIDOCAINE HCL 2 % EX GEL
1.0000 "application " | Freq: Once | CUTANEOUS | Status: AC
  Administered 2017-10-13: 1 via TOPICAL
  Filled 2017-10-13: qty 20

## 2017-10-13 NOTE — ED Triage Notes (Signed)
Pt reports constipation. States last good BM was 1 week ago.

## 2017-10-13 NOTE — ED Notes (Signed)
Pt and SO given d/c instructions as per chart. Rx x 2. Verbalizes understanding. No questions.

## 2017-10-13 NOTE — ED Notes (Signed)
!/  2 soap suds enema administered with good results, but pt requests the remainder to be administered. Moderate amt of soft brown stool passed. This was done until pt felt the urge to defecate. Now sitting on BSC.

## 2017-10-13 NOTE — ED Provider Notes (Signed)
[facility] EMERGENCY DEPARTMENT Provider Note   CSN: [id number] Arrival date & time: 10/13/17  1223     History   Chief Complaint Chief Complaint  Patient presents with  . Constipation    HPI Aaron Schmidt is a 84 y.o. male.  HPI  84 year old male with a long-standing history of constipation presents with worsening constipation.  Wife states is been more uncomfortable over the last 1 week or so.  Last bowel movement was 3 days ago and was minimal but semi-soft, semi-hard.  He has been passing some gas.  No nausea, vomiting, abdominal bloating, or abdominal pain.  He is on multiple different medicines and stool softeners for constipation.  States this has overall been a problem for "a long time".  No new medicines besides antibiotics for a lung empyema in December 2018.  Feels rectal pressure like he needs to go to the bathroom.  Past Medical History:  Diagnosis Date  . Arthritis    Degeneration spine & stenosis  . History of kidney stones   . Hypertension    has a histroy of  . Hypotension   . Sleep apnea 2012   used CPAP 2 yrs. ago, feels he sleeps better w/o, no longer using     Patient Active Problem List   Diagnosis Date Noted  . Anemia 09/21/2017  . Symptomatic anemia 09/20/2017  . Protein-calorie malnutrition, severe 09/11/2017  . Empyema lung ([place]) 09/09/2017  . SIRS (systemic inflammatory response syndrome) ([place]) 09/05/2017  . Elevated troponin 09/05/2017  . Normochromic normocytic anemia 09/05/2017  . Hyponatremia 09/05/2017  . Acute kidney injury superimposed on chronic kidney disease ([place]) 09/05/2017  . Chronic pain 09/05/2017  . Pleural effusion, left 09/04/2017  . Pleural effusion on left 08/19/2017  . Sinusitis, chronic 08/19/2017  . Murmur 01/08/2017  . Skin ulcer of toe of left foot, limited to breakdown of skin ([place]) 09/04/2016  . Cervical myelopathy with cervical radiculopathy 06/24/2014  . LBBB (left bundle branch block) 06/21/2014    . Hyperlipidemia 06/21/2014  . Preoperative cardiovascular examination 06/21/2014  . OBSTRUCTIVE SLEEP APNEA 09/30/2010  . Essential hypertension 08/29/2010    Past Surgical History:  Procedure Laterality Date  . ARM NEUROPLASTY     at 14 yrs. of age- fell off tractor- had fracture & repair *& later- 50's had  transplantation of a nerve at the elbow  . BACK SURGERY     x4 back surgery x2 fusion -  . CARPAL TUNNEL RELEASE Right   . COLONOSCOPY    . ESOPHAGOGASTRODUODENOSCOPY (EGD) WITH PROPOFOL N/A 09/23/2017   Procedure: ESOPHAGOGASTRODUODENOSCOPY (EGD) WITH PROPOFOL;  Surgeon: Laurence Spates, MD;  Location: [facility];  Service: Endoscopy;  Laterality: N/A;  . PLEURAL EFFUSION DRAINAGE Left 09/09/2017   Procedure: DRAINAGE OF PLEURAL EFFUSION;  Surgeon: [name], MD;  Location: [facility];  Service: Thoracic;  Laterality: Left;  . POSTERIOR CERVICAL FUSION/FORAMINOTOMY N/A 06/24/2014   Procedure: Posterior Cervical Three-Seven Fusion with Lateral Mass Fixation;  Surgeon: Erline Levine, MD;  Location: [facility];  Service: Neurosurgery;  Laterality: N/A;  C3-C7 posterior cervical fusion with lateral mass fixation  . SHOULDER SURGERY Bilateral   . VIDEO ASSISTED THORACOSCOPY (VATS)/DECORTICATION Left 09/09/2017   Procedure: VIDEO ASSISTED THORACOSCOPY (VATS)/DECORTICATION;  Surgeon: [name], MD;  Location: [facility];  Service: Thoracic;  Laterality: Left;  Mar[facility] VIDEO BRONCHOSCOPY  09/09/2017   Procedure: VIDEO BRONCHOSCOPY;  Surgeon: [name], MD;  Location: [facility];  Service: Thoracic;;  Home Medications    Prior to Admission medications   Medication Sig Start Date End Date Taking? Authorizing Provider  amoxicillin-clavulanate (AUGMENTIN) 875-125 MG tablet Take 1 tablet by mouth every 12 (twelve) hours. Patient taking differently: Take 1 tablet by mouth every 12 (twelve) hours. 5 week course (70 doses) started 09/15/17 09/14/17 10/19/17  Edwin Dada, MD  b complex vitamins tablet Take 1 tablet by mouth daily.      [provider]  Cholecalciferol (VITAMIN D) 2000 UNITS CAPS Take 2,000 Units by mouth daily.     [provider]  docusate sodium (COLACE) 100 MG capsule Take 300 mg by mouth daily.     [provider]  finasteride (PROSCAR) 5 MG tablet Take 5 mg by mouth daily.    [provider]  Flaxseed, Linseed, (FLAX SEED OIL) 1000 MG CAPS Take 1,000 mg by mouth daily.     [provider]  fluocinonide cream (LIDEX) 4.09 % Apply 1 application topically daily as needed (itching).    [provider]  HYDROmorphone (DILAUDID) 2 MG tablet Take 2 mg by mouth 4 (four) times daily as needed for severe pain.    [provider]  L-Lysine 500 MG CAPS Take 500 mg by mouth daily.     [provider]  mineral oil liquid Take 60 mLs by mouth daily at 3 pm.    [provider]  Multiple Vitamin (MULTIVITAMIN WITH MINERALS) TABS tablet Take 1 tablet by mouth daily.    [provider]  nortriptyline (PAMELOR) 25 MG capsule Take 25 mg by mouth at bedtime.     [provider]  pantoprazole (PROTONIX) 40 MG tablet Take 1 tablet (40 mg total) by mouth daily. 09/23/17 09/23/18  Caren Griffins, MD  pregabalin (LYRICA) 150 MG capsule Take 150 mg by mouth 2 (two) times daily.    [provider]  Probiotic Product (PROBIOTIC PO) Take 1 tablet by mouth 2 (two) times daily.    [provider]  pyridOXINE (VITAMIN B-6) 100 MG tablet Take 100 mg by mouth daily.      [provider]  saw palmetto 80 MG capsule Take 240 mg by mouth daily.     [provider]  Senna-Psyllium (PERDIEM PO) Take 3 tablets by mouth 2 (two) times daily as needed (constipation).     [provider]  simvastatin (ZOCOR) 40 MG tablet Take 40 mg by mouth at bedtime.     [provider]  tiZANidine (ZANAFLEX) 2 MG tablet Take 2 mg by  mouth 3 (three) times daily.     [provider]    Family History Family History  Problem Relation Age of Onset  . Pancreatic cancer Mother     Social History Social History   Tobacco Use  . Smoking status: Former Smoker    Years: 24.00    Types: Pipe    Last attempt to quit: 09/24/1978    Years since quitting: 39.0  . Smokeless tobacco: Never Used  Substance Use Topics  . Alcohol use: Yes    Comment: 3 drinks/day - gin or vodka   . Drug use: No     Allergies   Diazepam   Review of Systems Review of Systems  Constitutional: Negative for fatigue.  Cardiovascular: Negative for chest pain.  Gastrointestinal: Positive for constipation. Negative for abdominal distention, abdominal pain, blood in stool, nausea and vomiting.  Neurological: Negative for weakness.  All other systems reviewed and  are negative.    Physical Exam Updated Vital Signs BP (!) 111/45 (BP Location: Right Arm)   Pulse 87   Temp 98.3 F (36.8 C) (Oral)   Resp 16   Ht 5\' 10"  (1.778 m)   Wt 74.8 kg (165 lb)   SpO2 95%   BMI 23.68 kg/m   Physical Exam  Constitutional: He is oriented to person, place, and time. He appears well-developed and well-nourished. No distress.  HENT:  Head: Normocephalic and atraumatic.  Right Ear: External ear normal.  Left Ear: External ear normal.  Nose: Nose normal.  Eyes: Right eye exhibits no discharge. Left eye exhibits no discharge.  Neck: Neck supple.  Cardiovascular: Normal rate, regular rhythm and normal heart sounds.  Pulmonary/Chest: Effort normal and breath sounds normal.  Abdominal: Soft. He exhibits no distension. There is no tenderness.  Genitourinary: Rectal exam shows no external hemorrhoid, no internal hemorrhoid, no mass, no tenderness and anal tone normal.  Genitourinary Comments: Large amount of semi-soft stool, brown. C/w Fecal impaction. Mild discomfort with exam.  Musculoskeletal: He exhibits no edema.  Neurological: He is alert  and oriented to person, place, and time.  Skin: Skin is warm and dry. He is not diaphoretic.  Nursing note and vitals reviewed.    ED Treatments / Results  Labs (all labs ordered are listed, but only abnormal results are displayed) Labs Reviewed - No data to display  EKG  EKG Interpretation None       Radiology No results found.  Procedures Fecal disimpaction Date/Time: 10/13/2017 3:33 PM Performed by: Sherwood Gambler, MD Authorized by: Sherwood Gambler, MD  Consent: Verbal consent obtained. Risks and benefits: risks, benefits and alternatives were discussed Consent given by: patient Patient identity confirmed: verbally with patient Preparation: Patient was prepped and draped in the usual sterile fashion. Local anesthesia used: yes  Anesthesia: Local anesthesia used: yes Local Anesthetic: topical anesthetic  Sedation: Patient sedated: no  Patient tolerance: Patient tolerated the procedure well with no immediate complications    (including critical care time)  Medications Ordered in ED Medications  lidocaine (XYLOCAINE) 2 % jelly 1 application (1 application Topical Given by Other 10/13/17 1433)  mineral oil enema 1 enema (1 enema Rectal Given 10/13/17 1534)     Initial Impression / Assessment and Plan / ED Course  I have reviewed the triage vital signs and the nursing notes.  Pertinent labs & imaging results that were available during my care of the patient were reviewed by me and considered in my medical decision making (see chart for details).     Patient has had on and off constipation for a while and now appears to have a fecal impaction.  Stool was moderately removed by myself but still has a significant amount of stool behind this.  Thus he will be given an enema.  However he does not have any concerning findings such as vomiting, abdominal distention/bloating or abdominal pain.  Thus if he is unable to get a significant amount out with enema he can try  over-the-counter remedies such as magnesium citrate or MiraLAX.  Care transferred to Dr. Tamera Punt with enema pending.  Final Clinical Impressions(s) / ED Diagnoses   Final diagnoses:  Fecal impaction [facility])    ED Discharge Orders    None       Sherwood Gambler, MD 10/13/17 1616

## 2017-10-17 ENCOUNTER — Other Ambulatory Visit: Payer: Self-pay

## 2017-10-17 DIAGNOSIS — J849 Interstitial pulmonary disease, unspecified: Secondary | ICD-10-CM

## 2017-10-22 ENCOUNTER — Other Ambulatory Visit: Payer: Self-pay

## 2017-10-22 DIAGNOSIS — J849 Interstitial pulmonary disease, unspecified: Secondary | ICD-10-CM

## 2017-10-24 ENCOUNTER — Other Ambulatory Visit: Payer: Self-pay

## 2017-10-24 ENCOUNTER — Telehealth (HOSPITAL_COMMUNITY): Payer: Self-pay

## 2017-10-24 NOTE — Telephone Encounter (Signed)
Patients insurance is active and benefits verified through [employer] - $20.00 co-pay, no deductible, out of pocket amount of $4,400/$28.00 has been met, no co-insurance, and no pre-authorization is required. Reference [phone]

## 2017-11-12 ENCOUNTER — Telehealth (HOSPITAL_COMMUNITY): Payer: Self-pay

## 2017-11-12 NOTE — Telephone Encounter (Signed)
Called patient in regards to Pulmonary Rehab - patient is interested in the program. Aaron Schmidt over insurance and patient understands responsibility. Patient is scheduled to come in for orientation on 11/18/2017 at 9:15am. Patient will attend the 1:30pm exc class. He would like to switch to 10:30am once a spot becomes available.

## 2017-11-18 ENCOUNTER — Encounter (HOSPITAL_COMMUNITY)
Admission: RE | Admit: 2017-11-18 | Discharge: 2017-11-18 | Disposition: A | Discharge status: Home / Self Care | Payer: Medicare Other | Source: Ambulatory Visit | Attending: Thoracic Surgery (Cardiothoracic Vascular Surgery) | Admitting: Thoracic Surgery (Cardiothoracic Vascular Surgery)

## 2017-11-18 VITALS — BP 117/72 | HR 77 | Ht 70.5 in | Wt 169.0 lb

## 2017-11-18 DIAGNOSIS — Z87891 Personal history of nicotine dependence: Secondary | ICD-10-CM | POA: Diagnosis not present

## 2017-11-18 DIAGNOSIS — J849 Interstitial pulmonary disease, unspecified: Secondary | ICD-10-CM | POA: Diagnosis not present

## 2017-11-18 NOTE — Progress Notes (Signed)
Aaron Schmidt 84 y.o. male Pulmonary Rehab Orientation Note Patient arrived today in Cardiac and Pulmonary Rehab for orientation to Pulmonary Rehab. He was transported from [employer] via wheel chair. He does not carry portable oxygen. Per pt, he never uses oxygen. Color good, skin warm and dry. Patient is oriented to time and place. Patient's medical history, psychosocial health, and medications reviewed. Psychosocial assessment reveals pt lives with their spouse. Pt is currently retired, he worked for [employer] for 28 years, and retired early due to his job being eliminated. Pt hobbies include single action shooting, he is a member of this association and it also involves cowboy reenactment, which he does not do now. Pt reports his stress level is low to moderate due to finances. Pt does not exhibit  signs of depression.  PHQ2/9 score 0/0. Pt shows good  coping skills with positive outlook .  He deals with chronic back pain in the pelvic area and prevents him from being able to ride a bicycle, or the nustep, he will only be able to walk on the track.  His balance is not great, he uses a cane and has fallen twice in the last year due to his balance.  Offered emotional support and reassurance. Will continue to monitor and evaluate psychosocial to see if any issues arise.  Presently no psychosocial issues have been identified. Physical assessment reveals heart rate is normal, breath sounds clear to auscultation, no wheezes, rales, or rhonchi, diminished in the left base. Grip strength equal, strong. Distal pulses 2+ bilateral posterior tibial pulses present without peripheral edema. Patient reports he does take medications as prescribed. Patient states he follows a Regular diet. He has lost 30-35 pounds since his thoracic surgery and is trying to gain it back.. Patient's weight will be monitored closely. Demonstration and practice of PLB using pulse oximeter. Patient able to return demonstration satisfactorily.  Safety and hand hygiene in the exercise area reviewed with patient. Patient voices understanding of the information reviewed. Department expectations discussed with patient and achievable goals were set. The patient shows enthusiasm about attending the program and we look forward to working with this nice gentleman. The patient is scheduled for a 6 min walk test on Tuesday, November 19, 2017 @ 3 pm and to begin exercise on Tuesday, November 26, 2017 in the 1:30 pm class.  [id number]

## 2017-11-19 ENCOUNTER — Encounter (HOSPITAL_COMMUNITY)
Admission: RE | Admit: 2017-11-19 | Discharge: 2017-11-19 | Disposition: A | Discharge status: Home / Self Care | Payer: Medicare Other | Source: Ambulatory Visit | Attending: Thoracic Surgery (Cardiothoracic Vascular Surgery) | Admitting: Thoracic Surgery (Cardiothoracic Vascular Surgery)

## 2017-11-19 DIAGNOSIS — J849 Interstitial pulmonary disease, unspecified: Secondary | ICD-10-CM | POA: Diagnosis not present

## 2017-11-21 ENCOUNTER — Encounter (HOSPITAL_COMMUNITY): Payer: Self-pay | Admitting: *Deleted

## 2017-11-21 NOTE — Progress Notes (Signed)
Pulmonary Individual Treatment Plan  Patient Details  Name: Aaron Schmidt MRN: [id number] Date of Birth: 1932/12/07 Referring Provider:     Pulmonary Rehab Walk Test from 11/19/2017 in [place]  Referring Provider  Dr. Roxan Hockey      Initial Encounter Date:    Pulmonary Rehab Walk Test from 11/19/2017 in [place]  Date  11/21/17  Referring Provider  Dr. Roxan Hockey      Visit Diagnosis: Interstitial lung disease ([place])  Patient's Home Medications on Admission:   Current Outpatient Medications:  .  b complex vitamins tablet, Take 1 tablet by mouth daily.  , Disp: , Rfl:  .  Cholecalciferol (VITAMIN D) 2000 UNITS CAPS, Take 2,000 Units by mouth daily. , Disp: , Rfl:  .  docusate sodium (COLACE) 100 MG capsule, Take 300 mg by mouth daily. , Disp: , Rfl:  .  finasteride (PROSCAR) 5 MG tablet, Take 5 mg by mouth daily., Disp: , Rfl:  .  Flaxseed, Linseed, (FLAX SEED OIL) 1000 MG CAPS, Take 1,000 mg by mouth daily. , Disp: , Rfl:  .  fluocinonide cream (LIDEX) 3.47 %, Apply 1 application topically daily as needed (itching)., Disp: , Rfl:  .  HYDROmorphone (DILAUDID) 2 MG tablet, Take 2 mg by mouth 4 (four) times daily as needed for severe pain., Disp: , Rfl:  .  L-Lysine 500 MG CAPS, Take 500 mg by mouth daily. , Disp: , Rfl:  .  magnesium citrate SOLN, Use 6.5-10 ounces of magnesium citrate orally once or in a divided dose over 24 hours; mix with 8 ounces liquid, Disp: 195 mL, Rfl: 0 .  mineral oil liquid, Take 60 mLs by mouth daily at 3 pm., Disp: , Rfl:  .  Multiple Vitamin (MULTIVITAMIN WITH MINERALS) TABS tablet, Take 1 tablet by mouth daily., Disp: , Rfl:  .  nortriptyline (PAMELOR) 25 MG capsule, Take 25 mg by mouth at bedtime. , Disp: , Rfl:  .  pantoprazole (PROTONIX) 40 MG tablet, Take 1 tablet (40 mg total) by mouth daily. (Patient not taking: Reported on 11/18/2017), Disp: 30 tablet, Rfl: 1 .  polyethylene  glycol (MIRALAX / GLYCOLAX) packet, Take 17 g by mouth daily., Disp: 14 each, Rfl: 0 .  pregabalin (LYRICA) 150 MG capsule, Take 150 mg by mouth 2 (two) times daily., Disp: , Rfl:  .  Probiotic Product (PROBIOTIC PO), Take 1 tablet by mouth 2 (two) times daily., Disp: , Rfl:  .  pyridOXINE (VITAMIN B-6) 100 MG tablet, Take 100 mg by mouth daily.  , Disp: , Rfl:  .  saw palmetto 80 MG capsule, Take 240 mg by mouth daily. , Disp: , Rfl:  .  Senna-Psyllium (PERDIEM PO), Take 3 tablets by mouth 2 (two) times daily as needed (constipation). , Disp: , Rfl:  .  simvastatin (ZOCOR) 40 MG tablet, Take 40 mg by mouth at bedtime. , Disp: , Rfl:  .  tiZANidine (ZANAFLEX) 2 MG tablet, Take 2 mg by mouth 3 (three) times daily. , Disp: , Rfl:   Past Medical History: Past Medical History:  Diagnosis Date  . Arthritis    Degeneration spine & stenosis  . History of kidney stones   . Hypertension    has a histroy of  . Hypotension   . Sleep apnea 2012   used CPAP 2 yrs. ago, feels he sleeps better w/o, no longer using     Tobacco Use: Social History   Tobacco  Use  Smoking Status Former Smoker  . Years: 24.00  . Types: Pipe  . Last attempt to quit: 09/24/1978  . Years since quitting: 39.1  Smokeless Tobacco Never Used    Labs: Recent Chemical engineer    Labs for ITP Cardiac and Pulmonary Rehab Latest Ref Rng & Units 09/09/2017 09/09/2017 09/10/2017   PHART 7.350 - 7.450 7.452(H) 7.382 7.418   PCO2ART 32.0 - 48.0 mmHg 37.8 34.1 36.4   HCO3 20.0 - 28.0 mmol/L 26.0 20.4 23.5   TCO2 22 - 32 mmol/L - 21(L) 25   ACIDBASEDEF 0.0 - 2.0 mmol/L - 4.0(H) 1.0   O2SAT % 91.6 95.0 97.0      Capillary Blood Glucose: Lab Results  Component Value Date   GLUCAP 97 09/20/2017   GLUCAP 100 (H) 09/08/2017     Pulmonary Assessment Scores: Pulmonary Assessment Scores    Row Name 11/21/17 1319         ADL UCSD   ADL Phase  Entry       mMRC Score   mMRC Score  0        Pulmonary Function  Assessment: Pulmonary Function Assessment - 11/18/17 1005      Breath   Bilateral Breath Sounds  Clear;Decreased decreased in left lobe in cases    Shortness of Breath  No       Exercise Target Goals: Date: 11/21/17  Exercise Program Goal: Individual exercise prescription set using results from initial 6 min walk test and THRR while considering  patient's activity barriers and safety.    Exercise Prescription Goal: Initial exercise prescription builds to 30-45 minutes a day of aerobic activity, 2-3 days per week.  Home exercise guidelines will be given to patient during program as part of exercise prescription that the participant will acknowledge.  Activity Barriers & Risk Stratification: Activity Barriers & Cardiac Risk Stratification - 11/18/17 1010      Activity Barriers & Cardiac Risk Stratification   Activity Barriers  Back Problems 5 back surgeries, constant pain in pelvis area       6 Minute Walk: 6 Minute Walk    Row Name 11/21/17 1325         6 Minute Walk   Phase  Initial     Distance  1000 feet     Walk Time  6 minutes     # of Rest Breaks  0     MPH  1.89     METS  2.45     RPE  11     Perceived Dyspnea   0     Symptoms  No     Resting HR  88 bpm     Resting BP  118/68     Resting Oxygen Saturation   99 %     Exercise Oxygen Saturation  during 6 min walk  96 %     Max Ex. HR  104 bpm     Max Ex. BP  150/64       Interval HR   1 Minute HR  97     2 Minute HR  99     3 Minute HR  101     4 Minute HR  100     5 Minute HR  104     6 Minute HR  103     2 Minute Post HR  90     Interval Heart Rate?  Yes       Interval Oxygen  Interval Oxygen?  Yes     Baseline Oxygen Saturation %  99 %     1 Minute Oxygen Saturation %  98 %     1 Minute Liters of Oxygen  0 L     2 Minute Oxygen Saturation %  98 %     2 Minute Liters of Oxygen  0 L     3 Minute Oxygen Saturation %  97 %     3 Minute Liters of Oxygen  0 L     4 Minute Oxygen Saturation %  96  %     4 Minute Liters of Oxygen  0 L     5 Minute Oxygen Saturation %  97 %     5 Minute Liters of Oxygen  0 L     6 Minute Oxygen Saturation %  97 %     6 Minute Liters of Oxygen  0 L     2 Minute Post Oxygen Saturation %  97 %     2 Minute Post Liters of Oxygen  0 L        Oxygen Initial Assessment: Oxygen Initial Assessment - 11/21/17 1318      Initial 6 min Walk   Oxygen Used  None      Program Oxygen Prescription   Program Oxygen Prescription  None       Oxygen Re-Evaluation:   Oxygen Discharge (Final Oxygen Re-Evaluation):   Initial Exercise Prescription: Initial Exercise Prescription - 11/21/17 1300      Date of Initial Exercise RX and Referring Provider   Date  11/21/17    Referring Provider  Dr. Roxan Hockey      Track   Laps  26    Minutes  17      Prescription Details   Frequency (times per week)  2    Duration  Progress to 45 minutes of aerobic exercise without signs/symptoms of physical distress      Intensity   THRR 40-80% of Max Heartrate  55-110    Ratings of Perceived Exertion  11-13    Perceived Dyspnea  0-4      Progression   Progression  Continue progressive overload as per policy without signs/symptoms or physical distress.      Resistance Training   Training Prescription  Yes    Weight  orange bands    Reps  10-15       Perform Capillary Blood Glucose checks as needed.  Exercise Prescription Changes:   Exercise Comments:   Exercise Goals and Review:   Exercise Goals Re-Evaluation :   Discharge Exercise Prescription (Final Exercise Prescription Changes):   Nutrition:  Target Goals: Understanding of nutrition guidelines, daily intake of sodium 1500mg , cholesterol 200mg , calories 30% from fat and 7% or less from saturated fats, daily to have 5 or more servings of fruits and vegetables.  Biometrics: Pre Biometrics - 11/18/17 1011      Pre Biometrics   Grip Strength  35 kg        Nutrition Therapy Plan and  Nutrition Goals:   Nutrition Assessments:   Nutrition Goals Re-Evaluation:   Nutrition Goals Discharge (Final Nutrition Goals Re-Evaluation):   Psychosocial: Target Goals: Acknowledge presence or absence of significant depression and/or stress, maximize coping skills, provide positive support system. Participant is able to verbalize types and ability to use techniques and skills needed for reducing stress and depression.  Initial Review & Psychosocial Screening: Initial Psych Review & Screening - 11/18/17 1016  Family Dynamics   Good Support System?  Yes      Barriers   Psychosocial barriers to participate in program  Psychosocial barriers identified (see note) chronic back pain is a barrier      Screening Interventions   Interventions  Encouraged to exercise       Quality of Life Scores:  Scores of 19 and below usually indicate a poorer quality of life in these areas.  A difference of  2-3 points is a clinically meaningful difference.  A difference of 2-3 points in the total score of the Quality of Life Index has been associated with significant improvement in overall quality of life, self-image, physical symptoms, and general health in studies assessing change in quality of life.   PHQ-9: Recent Review Flowsheet Data    Depression screen [facility] 2/9 11/18/2017   Decreased Interest 0   Down, Depressed, Hopeless 0   PHQ - 2 Score 0     Interpretation of Total Score  Total Score Depression Severity:  1-4 = Minimal depression, 5-9 = Mild depression, 10-14 = Moderate depression, 15-19 = Moderately severe depression, 20-27 = Severe depression   Psychosocial Evaluation and Intervention: Psychosocial Evaluation - 11/18/17 1018      Psychosocial Evaluation & Interventions   Interventions  Encouraged to exercise with the program and follow exercise prescription    Continue Psychosocial Services   No Follow up required       Psychosocial Re-Evaluation:   Psychosocial  Discharge (Final Psychosocial Re-Evaluation):   Education: Education Goals: Education classes will be provided on a weekly basis, covering required topics. Participant will state understanding/return demonstration of topics presented.  Learning Barriers/Preferences: Learning Barriers/Preferences - 11/18/17 1004      Learning Barriers/Preferences   Learning Barriers  None    Learning Preferences  Audio;Verbal Instruction;Pictoral       Education Topics: Risk Factor Reduction:  -Group instruction that is supported by a PowerPoint presentation. Instructor discusses the definition of a risk factor, different risk factors for pulmonary disease, and how the heart and lungs work together.     Nutrition for Pulmonary Patient:  -Group instruction provided by PowerPoint slides, verbal discussion, and written materials to support subject matter. The instructor gives an explanation and review of healthy diet recommendations, which includes a discussion on weight management, recommendations for fruit and vegetable consumption, as well as protein, fluid, caffeine, fiber, sodium, sugar, and alcohol. Tips for eating when patients are short of breath are discussed.   Pursed Lip Breathing:  -Group instruction that is supported by demonstration and informational handouts. Instructor discusses the benefits of pursed lip and diaphragmatic breathing and detailed demonstration on how to preform both.     Oxygen Safety:  -Group instruction provided by PowerPoint, verbal discussion, and written material to support subject matter. There is an overview of "What is Oxygen" and "Why do we need it".  Instructor also reviews how to create a safe environment for oxygen use, the importance of using oxygen as prescribed, and the risks of noncompliance. There is a brief discussion on traveling with oxygen and resources the patient may utilize.   Oxygen Equipment:  -Group instruction provided by [facility] Staff  utilizing handouts, written materials, and equipment demonstrations.   Signs and Symptoms:  -Group instruction provided by written material and verbal discussion to support subject matter. Warning signs and symptoms of infection, stroke, and heart attack are reviewed and when to call the physician/911 reinforced. Tips for preventing the spread of infection discussed.  Advanced Directives:  -Group instruction provided by verbal instruction and written material to support subject matter. Instructor reviews Advanced Directive laws and proper instruction for filling out document.   Pulmonary Video:  -Group video education that reviews the importance of medication and oxygen compliance, exercise, good nutrition, pulmonary hygiene, and pursed lip and diaphragmatic breathing for the pulmonary patient.   Exercise for the Pulmonary Patient:  -Group instruction that is supported by a PowerPoint presentation. Instructor discusses benefits of exercise, core components of exercise, frequency, duration, and intensity of an exercise routine, importance of utilizing pulse oximetry during exercise, safety while exercising, and options of places to exercise outside of rehab.     Pulmonary Medications:  -Verbally interactive group education provided by instructor with focus on inhaled medications and proper administration.   Anatomy and Physiology of the Respiratory System and Intimacy:  -Group instruction provided by PowerPoint, verbal discussion, and written material to support subject matter. Instructor reviews respiratory cycle and anatomical components of the respiratory system and their functions. Instructor also reviews differences in obstructive and restrictive respiratory diseases with examples of each. Intimacy, Sex, and Sexuality differences are reviewed with a discussion on how relationships can change when diagnosed with pulmonary disease. Common sexual concerns are reviewed.   MD DAY -A group  question and answer session with a medical doctor that allows participants to ask questions that relate to their pulmonary disease state.   OTHER EDUCATION -Group or individual verbal, written, or video instructions that support the educational goals of the pulmonary rehab program.   Holiday Eating Survival Tips:  -Group instruction provided by PowerPoint slides, verbal discussion, and written materials to support subject matter. The instructor gives patients tips, tricks, and techniques to help them not only survive but enjoy the holidays despite the onslaught of food that accompanies the holidays.   Knowledge Questionnaire Score:   Core Components/Risk Factors/Patient Goals at Admission: Personal Goals and Risk Factors at Admission - 11/18/17 1013      Core Components/Risk Factors/Patient Goals on Admission   Personal Goal Other  Yes    Personal Goal  increase strength and stamina    Intervention  exercise in pulmonary rehab    Expected Outcomes  strength and stamina increase       Core Components/Risk Factors/Patient Goals Review:  Goals and Risk Factor Review    Row Name 11/18/17 1012             Core Components/Risk Factors/Patient Goals Review   Personal Goals Review  Other increase strength and stamina          Core Components/Risk Factors/Patient Goals at Discharge (Final Review):  Goals and Risk Factor Review - 11/18/17 1012      Core Components/Risk Factors/Patient Goals Review   Personal Goals Review  Other increase strength and stamina       ITP Comments:   Comments:

## 2017-11-26 ENCOUNTER — Encounter (HOSPITAL_COMMUNITY)
Admission: RE | Admit: 2017-11-26 | Discharge: 2017-11-26 | Disposition: A | Discharge status: Home / Self Care | Payer: Medicare Other | Source: Ambulatory Visit | Attending: Thoracic Surgery (Cardiothoracic Vascular Surgery) | Admitting: Thoracic Surgery (Cardiothoracic Vascular Surgery)

## 2017-11-26 VITALS — Wt 174.2 lb

## 2017-11-26 DIAGNOSIS — Z87891 Personal history of nicotine dependence: Secondary | ICD-10-CM | POA: Insufficient documentation

## 2017-11-26 DIAGNOSIS — J849 Interstitial pulmonary disease, unspecified: Secondary | ICD-10-CM | POA: Insufficient documentation

## 2017-11-26 NOTE — Progress Notes (Signed)
Daily Session Note  Patient Details  Name: Aaron Schmidt MRN: [id number] Date of Birth: Mar 29, 1933 Referring Provider:     Pulmonary Rehab Walk Test from 11/19/2017 in [place]  Referring Provider  Dr. Roxan Hockey      Encounter Date: 11/26/2017  Check In: Session Check In - 11/26/17 1218      Check-In   Location  [facility]    Staff Present  Trish Fountain, RN, BSN;Molly diVincenzo, MS, ACSM RCEP, Exercise Physiologist;Joan Leonia Reeves, RN, Roque Cash, RN    Supervising physician immediately available to respond to emergencies  [facility] immediately available    Physician(s)  Dr. Eliseo Squires    Medication changes reported      No    Fall or balance concerns reported     No    Tobacco Cessation  No Change    Warm-up and Cool-down  Performed as group-led instruction    Resistance Training Performed  Yes    VAD Patient?  No      Pain Assessment   Currently in Pain?  No/denies    Pain Score  6     Pain Location  Back    Pain Orientation  Left    Multiple Pain Sites  No       Capillary Blood Glucose: No results found for this or any previous visit (from the past 24 hour(s)).  Exercise Prescription Changes - 11/26/17 1200      Response to Exercise   Blood Pressure (Admit)  102/60    Blood Pressure (Exercise)  134/62    Blood Pressure (Exit)  110/56    Heart Rate (Admit)  67 bpm    Heart Rate (Exercise)  80 bpm    Heart Rate (Exit)  71 bpm    Oxygen Saturation (Admit)  100 %    Oxygen Saturation (Exercise)  98 %    Oxygen Saturation (Exit)  99 %    Rating of Perceived Exertion (Exercise)  17    Perceived Dyspnea (Exercise)  0    Duration  Continue with 45 min of aerobic exercise without signs/symptoms of physical distress.    Intensity  THRR unchanged      Progression   Progression  Continue to progress workloads to maintain intensity without signs/symptoms of physical distress.      Resistance Training   Training  Prescription  Yes    Weight  orange bands    Reps  10-15      Track   Laps  13.5    Minutes  17       Social History   Tobacco Use  Smoking Status Former Smoker  . Years: 24.00  . Types: Pipe  . Last attempt to quit: 09/24/1978  . Years since quitting: 39.2  Smokeless Tobacco Never Used    Goals Met:  Exercise tolerated well No report of cardiac concerns or symptoms Strength training completed today  Goals Unmet:  Not Applicable  Comments: Service time is from 10:30a to 12:10p    Dr. Rush Farmer is Medical Director for Pulmonary Rehab at [facility].

## 2017-11-28 ENCOUNTER — Encounter (HOSPITAL_COMMUNITY): Payer: Medicare Other

## 2017-11-28 ENCOUNTER — Telehealth (HOSPITAL_COMMUNITY): Payer: Self-pay | Admitting: Family Medicine

## 2017-12-03 ENCOUNTER — Encounter (HOSPITAL_COMMUNITY): Payer: Medicare Other

## 2017-12-05 ENCOUNTER — Encounter (HOSPITAL_COMMUNITY): Payer: Medicare Other

## 2017-12-10 ENCOUNTER — Encounter (HOSPITAL_COMMUNITY): Payer: Medicare Other

## 2017-12-12 ENCOUNTER — Encounter (HOSPITAL_COMMUNITY): Payer: Medicare Other

## 2017-12-13 ENCOUNTER — Encounter (HOSPITAL_COMMUNITY): Payer: Self-pay

## 2017-12-13 NOTE — Progress Notes (Signed)
Discharge Progress Report  Patient Details  Name: Aaron Schmidt MRN: [id number] Date of Birth: 05-Mar-1933 Referring Provider:     Pulmonary Rehab Walk Test from 11/19/2017 in [place]  Referring Provider  Dr. Roxan Hockey       Number of Visits: 1  Reason for Discharge:  Early Exit:  patient unable to meet physical requirements for the program related to chronic back pain  Smoking History:  Social History   Tobacco Use  Smoking Status Former Smoker  . Years: 24.00  . Types: Pipe  . Last attempt to quit: 09/24/1978  . Years since quitting: 39.2  Smokeless Tobacco Never Used    Diagnosis:  No diagnosis found.  ADL UCSD: Pulmonary Assessment Scores    Row Name 11/21/17 1319 11/21/17 1749       ADL UCSD   ADL Phase  Entry  Entry    SOB Score total  -  62      CAT Score   CAT Score  -  18 Entry      mMRC Score   mMRC Score  0  -       Initial Exercise Prescription: Initial Exercise Prescription - 11/21/17 1300      Date of Initial Exercise RX and Referring Provider   Date  11/21/17    Referring Provider  Dr. Roxan Hockey      Track   Laps  26    Minutes  17      Prescription Details   Frequency (times per week)  2    Duration  Progress to 45 minutes of aerobic exercise without signs/symptoms of physical distress      Intensity   THRR 40-80% of Max Heartrate  55-110    Ratings of Perceived Exertion  11-13    Perceived Dyspnea  0-4      Progression   Progression  Continue progressive overload as per policy without signs/symptoms or physical distress.      Resistance Training   Training Prescription  Yes    Weight  orange bands    Reps  10-15       Discharge Exercise Prescription (Final Exercise Prescription Changes): Exercise Prescription Changes - 11/26/17 1200      Response to Exercise   Blood Pressure (Admit)  102/60    Blood Pressure (Exercise)  134/62    Blood Pressure (Exit)  110/56    Heart Rate (Admit)   67 bpm    Heart Rate (Exercise)  80 bpm    Heart Rate (Exit)  71 bpm    Oxygen Saturation (Admit)  100 %    Oxygen Saturation (Exercise)  98 %    Oxygen Saturation (Exit)  99 %    Rating of Perceived Exertion (Exercise)  17    Perceived Dyspnea (Exercise)  0    Duration  Continue with 45 min of aerobic exercise without signs/symptoms of physical distress.    Intensity  THRR unchanged      Progression   Progression  Continue to progress workloads to maintain intensity without signs/symptoms of physical distress.      Resistance Training   Training Prescription  Yes    Weight  orange bands    Reps  10-15      Track   Laps  13.5    Minutes  17       Functional Capacity: 6 Minute Walk    Row Name 11/21/17 1325  6 Minute Walk   Phase  Initial     Distance  1000 feet     Walk Time  6 minutes     # of Rest Breaks  0     MPH  1.89     METS  2.45     RPE  11     Perceived Dyspnea   0     Symptoms  No     Resting HR  88 bpm     Resting BP  118/68     Resting Oxygen Saturation   99 %     Exercise Oxygen Saturation  during 6 min walk  96 %     Max Ex. HR  104 bpm     Max Ex. BP  150/64       Interval HR   1 Minute HR  97     2 Minute HR  99     3 Minute HR  101     4 Minute HR  100     5 Minute HR  104     6 Minute HR  103     2 Minute Post HR  90     Interval Heart Rate?  Yes       Interval Oxygen   Interval Oxygen?  Yes     Baseline Oxygen Saturation %  99 %     1 Minute Oxygen Saturation %  98 %     1 Minute Liters of Oxygen  0 L     2 Minute Oxygen Saturation %  98 %     2 Minute Liters of Oxygen  0 L     3 Minute Oxygen Saturation %  97 %     3 Minute Liters of Oxygen  0 L     4 Minute Oxygen Saturation %  96 %     4 Minute Liters of Oxygen  0 L     5 Minute Oxygen Saturation %  97 %     5 Minute Liters of Oxygen  0 L     6 Minute Oxygen Saturation %  97 %     6 Minute Liters of Oxygen  0 L     2 Minute Post Oxygen Saturation %  97 %     2  Minute Post Liters of Oxygen  0 L        Psychological, QOL, Others - Outcomes: PHQ 2/9: Depression screen PHQ 2/9 11/18/2017  Decreased Interest 0  Down, Depressed, Hopeless 0  PHQ - 2 Score 0    Quality of Life:   Personal Goals: Goals established at orientation with interventions provided to work toward goal. Personal Goals and Risk Factors at Admission - 11/18/17 1013      Core Components/Risk Factors/Patient Goals on Admission   Personal Goal Other  Yes    Personal Goal  increase strength and stamina    Intervention  exercise in pulmonary rehab    Expected Outcomes  strength and stamina increase        Personal Goals Discharge: Goals and Risk Factor Review    Row Name 11/18/17 1012 12/13/17 1208           Core Components/Risk Factors/Patient Goals Review   Personal Goals Review  Other increase strength and stamina  Other      Review  -  goal unmet. patient only attended one rehab exercise session related to chronic pain  Exercise Goals and Review:   Nutrition & Weight - Outcomes: Pre Biometrics - 11/18/17 1011      Pre Biometrics   Grip Strength  35 kg        Nutrition:   Nutrition Discharge: Nutrition Assessments - 12/13/17 1130      Rate Your Plate Scores   Pre Score  51       Education Questionnaire Score: Knowledge Questionnaire Score - 11/21/17 1749      Knowledge Questionnaire Score   Pre Score  14/18

## 2017-12-16 ENCOUNTER — Other Ambulatory Visit: Payer: Self-pay | Admitting: Thoracic Surgery (Cardiothoracic Vascular Surgery)

## 2017-12-16 DIAGNOSIS — J869 Pyothorax without fistula: Secondary | ICD-10-CM

## 2017-12-17 ENCOUNTER — Encounter (HOSPITAL_COMMUNITY): Payer: Medicare Other

## 2017-12-19 ENCOUNTER — Encounter (HOSPITAL_COMMUNITY): Payer: Medicare Other

## 2017-12-24 ENCOUNTER — Encounter (HOSPITAL_COMMUNITY): Payer: Medicare Other

## 2017-12-26 ENCOUNTER — Encounter (HOSPITAL_COMMUNITY): Payer: Medicare Other

## 2017-12-31 ENCOUNTER — Encounter: Payer: Self-pay | Admitting: Thoracic Surgery (Cardiothoracic Vascular Surgery)

## 2017-12-31 ENCOUNTER — Encounter (HOSPITAL_COMMUNITY): Payer: Medicare Other

## 2017-12-31 ENCOUNTER — Other Ambulatory Visit: Payer: Self-pay

## 2017-12-31 ENCOUNTER — Ambulatory Visit: Payer: Medicare Other | Admitting: Thoracic Surgery (Cardiothoracic Vascular Surgery)

## 2017-12-31 ENCOUNTER — Ambulatory Visit
Admission: RE | Admit: 2017-12-31 | Discharge: 2017-12-31 | Disposition: A | Discharge status: Home / Self Care | Payer: Medicare Other | Source: Ambulatory Visit | Attending: Thoracic Surgery (Cardiothoracic Vascular Surgery) | Admitting: Thoracic Surgery (Cardiothoracic Vascular Surgery)

## 2017-12-31 VITALS — BP 157/80 | HR 81 | Resp 16 | Ht 70.0 in | Wt 174.0 lb

## 2017-12-31 DIAGNOSIS — J869 Pyothorax without fistula: Secondary | ICD-10-CM

## 2017-12-31 DIAGNOSIS — J90 Pleural effusion, not elsewhere classified: Secondary | ICD-10-CM | POA: Diagnosis not present

## 2017-12-31 DIAGNOSIS — Z09 Encounter for follow-up examination after completed treatment for conditions other than malignant neoplasm: Secondary | ICD-10-CM

## 2017-12-31 NOTE — Progress Notes (Signed)
[address]       [place] [address]             [phone]      HPI: Mr Mutschler returns for a 74-month follow-up with a CT of the chest.  Mr. Schriefer is an 84 year old man with a history of multiple spinal surgeries, chronic narcotic dependent back pain, hypertension, BPH, and sleep apnea.  He smoked a pipe for many years before quitting in 1980.  He developed shortness of breath and pleuritic left-sided chest pain back in November.  I did a left VATS and decortication on 09/09/2017.  Pathology showed only inflammation.  Cultures turned out negative.  On bronchoscopy there was some extrinsic compression of his lower lobe airways but no endobronchial masses were seen.  He did well initially went home on day 5.  About a week later he had gastrointestinal bleeding and required transfusions.  I saw him back in the office in January.  He was doing well at that time, but did still feel weak and tired.  He feels much better today.  He says he has not had any problems with his breathing.  He does not have any pain related to his incision.  Past Medical History:  Diagnosis Date  . Arthritis    Degeneration spine & stenosis  . History of kidney stones   . Hypertension    has a histroy of  . Hypotension   . Sleep apnea 2012   used CPAP 2 yrs. ago, feels he sleeps better w/o, no longer using     Current Outpatient Medications  Medication Sig Dispense Refill  . b complex vitamins tablet Take 1 tablet by mouth daily.      . Cholecalciferol (VITAMIN D) 2000 UNITS CAPS Take 2,000 Units by mouth daily.     Marland Kitchen docusate sodium (COLACE) 100 MG capsule Take 300 mg by mouth daily.     . Ferrous Sulfate (IRON) 325 (65 Fe) MG TABS Take by mouth every other day.    . finasteride (PROSCAR) 5 MG tablet Take 5 mg by mouth daily.    . Flaxseed, Linseed, (FLAX SEED OIL) 1000 MG CAPS Take 1,000 mg by mouth daily.     . fluocinonide cream (LIDEX) 2.42 % Apply 1 application topically daily  as needed (itching).    Marland Kitchen HYDROmorphone (DILAUDID) 2 MG tablet Take 2 mg by mouth 4 (four) times daily as needed for severe pain.    Marland Kitchen L-Lysine 500 MG CAPS Take 500 mg by mouth daily.     . magnesium citrate SOLN Use 6.5-10 ounces of magnesium citrate orally once or in a divided dose over 24 hours; mix with 8 ounces liquid 195 mL 0  . mineral oil liquid Take 60 mLs by mouth daily at 3 pm.    . Multiple Vitamin (MULTIVITAMIN WITH MINERALS) TABS tablet Take 1 tablet by mouth daily.    . nortriptyline (PAMELOR) 25 MG capsule Take 25 mg by mouth at bedtime.     . pantoprazole (PROTONIX) 40 MG tablet Take 1 tablet (40 mg total) by mouth daily. 30 tablet 1  . polyethylene glycol (MIRALAX / GLYCOLAX) packet Take 17 g by mouth daily. 14 each 0  . pregabalin (LYRICA) 150 MG capsule Take 150 mg by mouth 2 (two) times daily.    . Probiotic Product (PROBIOTIC PO) Take 1 tablet by mouth 2 (two) times daily.    Marland Kitchen pyridOXINE (VITAMIN B-6) 100 MG tablet  Take 100 mg by mouth daily.      . saw palmetto 80 MG capsule Take 240 mg by mouth daily.     . Senna-Psyllium (PERDIEM PO) Take 3 tablets by mouth 2 (two) times daily as needed (constipation).     . simvastatin (ZOCOR) 40 MG tablet Take 40 mg by mouth at bedtime.     Marland Kitchen tiZANidine (ZANAFLEX) 2 MG tablet Take 2 mg by mouth 3 (three) times daily.      No current facility-administered medications for this visit.     Physical Exam BP (!) 157/80 (BP Location: Right Arm, Patient Position: Sitting, Cuff Size: Normal)   Pulse 81   Resp 16   Ht 5\' 10"  (1.778 m)   Wt 174 lb (78.9 kg)   SpO2 98% Comment: ON RA  BMI 24.66 kg/m  84 year old man in no acute distress Alert and oriented x3 with no focal deficits No cervical or supraclavicular adenopathy Cardiac regular rate and rhythm with a 2/6 systolic murmur Lungs diminished at left base, otherwise clear Incisions well-healed  Diagnostic Tests: CT CHEST WITHOUT CONTRAST  TECHNIQUE: Multidetector CT  imaging of the chest was performed following the standard protocol without IV contrast.  COMPARISON:  Chest CT 08/13/2017.  FINDINGS: Cardiovascular: Heart size is normal. There is no significant pericardial fluid, thickening or pericardial calcification. Aortic atherosclerosis. No coronary artery calcifications. Calcification of the mitral annulus.  Mediastinum/Nodes: No pathologically enlarged mediastinal or hilar lymph nodes. Please note that accurate exclusion of hilar adenopathy is limited on noncontrast CT scans. Esophagus is unremarkable in appearance. No axillary lymphadenopathy.  Lungs/Pleura: Previously noted left pleural effusion has significantly decreased in size, now only a trace volume of left-sided pleural fluid. There is pleural thickening and a few scattered left-sided pleural calcifications, sequela of prior empyema. Pleuroparenchymal banding is noted throughout the left lung, most evident in the left lower lobe. No acute consolidative airspace disease. No right-sided pleural effusion. No definite suspicious appearing pulmonary nodules or masses are noted. A few scattered 2-3 mm nodules are noted in the periphery of the right lung, most evident in the right lower lobe, similar to the prior study, likely benign. The largest pulmonary nodule is an elongated 8 x 2 mm (mean diameter 5 mm) nodule in the right lower lobe (axial image 115 of series 8), which is unchanged in retrospect compared to the prior study. Scattered calcified granulomas are also noted.  Upper Abdomen: Aortic atherosclerosis.  Musculoskeletal: There are no aggressive appearing lytic or blastic lesions noted in the visualized portions of the skeleton.  IMPRESSION: 1. Sequela of prior left-sided empyema with residual pleural thickening and developing pleuroparenchymal scarring and pleural calcifications, as above. 2. Multiple tiny pulmonary nodules scattered throughout the right lung  measuring 5 mm or less in size. No follow-up needed if patient is low-risk (and has no known or suspected primary neoplasm). Non-contrast chest CT can be considered in 12 months if patient is high-risk. This recommendation follows the consensus statement: Guidelines for Management of Incidental Pulmonary Nodules Detected on CT Images: From the Fleischner Society 2017; Radiology 2017; 284:228-243. 3. There are calcifications of the mitral annulus. Echocardiographic correlation for evaluation of potential valvular dysfunction may be warranted if clinically indicated.   Electronically Signed   By: Vinnie Langton M.D.   On: 12/31/2017 15:06 I personally reviewed the CT images and concur with the findings noted above  Impression: Mr. Dubeau is an 84 year old man who had a left VATS and decortication for an empyema  back in December 2018.  There were no endobronchial masses but he did have some extrinsic compression of his lower lobe bronchi.  I recommended an interval 71-month CT scan to rule out the possibility of a mass causing that.  There is no sign of a mass lesion in the lungs.  His CT shows some residual pleural and parenchymal scarring on the left.  He does have multiple small right lung nodules.  These are not particularly suspicious.  I discussed these nodules with him and just to be on the safe side we agreed to do another CT in about a year to follow-up on those.  Plan: Return in 1 year with CT chest  [name], MD [facility] [phone]

## 2018-01-02 ENCOUNTER — Encounter (HOSPITAL_COMMUNITY): Payer: Medicare Other

## 2018-01-07 ENCOUNTER — Encounter (HOSPITAL_COMMUNITY): Payer: Medicare Other

## 2018-01-09 ENCOUNTER — Encounter (HOSPITAL_COMMUNITY): Payer: Medicare Other

## 2018-01-14 ENCOUNTER — Encounter (HOSPITAL_COMMUNITY): Payer: Medicare Other

## 2018-01-16 ENCOUNTER — Encounter (HOSPITAL_COMMUNITY): Payer: Medicare Other

## 2018-01-21 ENCOUNTER — Encounter (HOSPITAL_COMMUNITY): Payer: Medicare Other

## 2018-01-23 ENCOUNTER — Encounter (HOSPITAL_COMMUNITY): Payer: Medicare Other

## 2018-01-28 ENCOUNTER — Encounter (HOSPITAL_COMMUNITY): Payer: Medicare Other

## 2018-01-30 ENCOUNTER — Encounter (HOSPITAL_COMMUNITY): Payer: Medicare Other

## 2018-02-04 ENCOUNTER — Encounter (HOSPITAL_COMMUNITY): Payer: Medicare Other

## 2018-02-06 ENCOUNTER — Encounter (HOSPITAL_COMMUNITY): Payer: Medicare Other

## 2018-02-11 ENCOUNTER — Encounter (HOSPITAL_COMMUNITY): Payer: Medicare Other

## 2018-02-13 ENCOUNTER — Encounter (HOSPITAL_COMMUNITY): Payer: Medicare Other

## 2018-02-18 ENCOUNTER — Encounter (HOSPITAL_COMMUNITY): Payer: Medicare Other

## 2018-02-20 ENCOUNTER — Encounter (HOSPITAL_COMMUNITY): Payer: Medicare Other

## 2018-02-25 ENCOUNTER — Encounter (HOSPITAL_COMMUNITY): Payer: Medicare Other

## 2018-02-27 ENCOUNTER — Encounter (HOSPITAL_COMMUNITY): Payer: Medicare Other

## 2018-05-22 ENCOUNTER — Emergency Department (HOSPITAL_COMMUNITY): Payer: Medicare Other

## 2018-05-22 ENCOUNTER — Emergency Department (HOSPITAL_COMMUNITY)
Admission: EM | Admit: 2018-05-22 | Discharge: 2018-05-22 | Disposition: A | Discharge status: Home / Self Care | Payer: Medicare Other | Attending: Emergency Medicine | Admitting: Emergency Medicine

## 2018-05-22 ENCOUNTER — Encounter (HOSPITAL_COMMUNITY): Payer: Self-pay | Admitting: Emergency Medicine

## 2018-05-22 DIAGNOSIS — N289 Disorder of kidney and ureter, unspecified: Secondary | ICD-10-CM

## 2018-05-22 DIAGNOSIS — R0989 Other specified symptoms and signs involving the circulatory and respiratory systems: Secondary | ICD-10-CM

## 2018-05-22 DIAGNOSIS — R519 Headache, unspecified: Secondary | ICD-10-CM

## 2018-05-22 DIAGNOSIS — Z79899 Other long term (current) drug therapy: Secondary | ICD-10-CM | POA: Insufficient documentation

## 2018-05-22 DIAGNOSIS — I129 Hypertensive chronic kidney disease with stage 1 through stage 4 chronic kidney disease, or unspecified chronic kidney disease: Secondary | ICD-10-CM | POA: Diagnosis not present

## 2018-05-22 DIAGNOSIS — N189 Chronic kidney disease, unspecified: Secondary | ICD-10-CM | POA: Diagnosis not present

## 2018-05-22 DIAGNOSIS — R42 Dizziness and giddiness: Secondary | ICD-10-CM | POA: Diagnosis present

## 2018-05-22 DIAGNOSIS — Z87891 Personal history of nicotine dependence: Secondary | ICD-10-CM | POA: Insufficient documentation

## 2018-05-22 DIAGNOSIS — R51 Headache: Secondary | ICD-10-CM | POA: Insufficient documentation

## 2018-05-22 DIAGNOSIS — E860 Dehydration: Secondary | ICD-10-CM | POA: Insufficient documentation

## 2018-05-22 LAB — CBC WITH DIFFERENTIAL/PLATELET
Basophils Absolute: 0 10*3/uL (ref 0.0–0.1)
Basophils Relative: 0 %
Eosinophils Absolute: 0.1 10*3/uL (ref 0.0–0.7)
Eosinophils Relative: 2 %
HCT: 37.9 % — ABNORMAL LOW (ref 39.0–52.0)
Hemoglobin: 12.7 g/dL — ABNORMAL LOW (ref 13.0–17.0)
Lymphocytes Relative: 15 %
Lymphs Abs: 0.6 10*3/uL — ABNORMAL LOW (ref 0.7–4.0)
MCH: 30.6 pg (ref 26.0–34.0)
MCHC: 33.5 g/dL (ref 30.0–36.0)
MCV: 91.3 fL (ref 78.0–100.0)
Monocytes Absolute: 0.3 10*3/uL (ref 0.1–1.0)
Monocytes Relative: 9 %
Neutro Abs: 2.7 10*3/uL (ref 1.7–7.7)
Neutrophils Relative %: 74 %
Platelets: 124 10*3/uL — ABNORMAL LOW (ref 150–400)
RBC: 4.15 MIL/uL — ABNORMAL LOW (ref 4.22–5.81)
RDW: 14.3 % (ref 11.5–15.5)
WBC: 3.6 10*3/uL — ABNORMAL LOW (ref 4.0–10.5)

## 2018-05-22 LAB — COMPREHENSIVE METABOLIC PANEL
ALT: 23 U/L (ref 0–44)
AST: 34 U/L (ref 15–41)
Albumin: 3.5 g/dL (ref 3.5–5.0)
Alkaline Phosphatase: 66 U/L (ref 38–126)
Anion gap: 9 (ref 5–15)
BUN: 37 mg/dL — ABNORMAL HIGH (ref 8–23)
CO2: 27 mmol/L (ref 22–32)
Calcium: 8.8 mg/dL — ABNORMAL LOW (ref 8.9–10.3)
Chloride: 101 mmol/L (ref 98–111)
Creatinine, Ser: 1.78 mg/dL — ABNORMAL HIGH (ref 0.61–1.24)
GFR calc Af Amer: 39 mL/min — ABNORMAL LOW (ref >60)
GFR calc non Af Amer: 34 mL/min — ABNORMAL LOW (ref >60)
Glucose, Bld: 112 mg/dL — ABNORMAL HIGH (ref 70–99)
Potassium: 4.1 mmol/L (ref 3.5–5.1)
Sodium: 137 mmol/L (ref 135–145)
Total Bilirubin: 0.5 mg/dL (ref 0.3–1.2)
Total Protein: 6.1 g/dL — ABNORMAL LOW (ref 6.5–8.1)

## 2018-05-22 LAB — TROPONIN I: Troponin I: <0.03 ng/mL (ref <0.03)

## 2018-05-22 MED ORDER — SODIUM CHLORIDE 0.9 % IV SOLN
INTRAVENOUS | Status: DC
  Administered 2018-05-22 (×2): via INTRAVENOUS

## 2018-05-22 MED ORDER — METOCLOPRAMIDE HCL 5 MG/ML IJ SOLN
2.5000 mg | Freq: Once | INTRAMUSCULAR | Status: AC
  Administered 2018-05-22: 2.5 mg via INTRAVENOUS
  Filled 2018-05-22: qty 2

## 2018-05-22 MED ORDER — SODIUM CHLORIDE 0.9 % IV BOLUS
500.0000 mL | Freq: Once | INTRAVENOUS | Status: AC
  Administered 2018-05-22: 500 mL via INTRAVENOUS

## 2018-05-22 MED ORDER — SODIUM CHLORIDE 0.9 % IV SOLN
INTRAVENOUS | Status: DC
  Administered 2018-05-22: 16:00:00 via INTRAVENOUS

## 2018-05-22 MED ORDER — MORPHINE SULFATE (PF) 2 MG/ML IV SOLN
2.0000 mg | Freq: Once | INTRAVENOUS | Status: AC
  Administered 2018-05-22: 2 mg via INTRAVENOUS
  Filled 2018-05-22: qty 1

## 2018-05-22 NOTE — ED Triage Notes (Signed)
Patient here from dr office with complaints of hypotension and headache. States that he has been weak for the past few days. Denies n/v.

## 2018-05-22 NOTE — ED Provider Notes (Signed)
HA improved. Nontoxic. Labile blood pressure for some time now as documented through out the medical record. Outpatient pcp and nephrology follow up. Improved after fluids. May need adrenal workup as outpatient. ?need for midrin.   Encouraged to return to the ER for new or worsening symptoms   Jola Schmidt, MD 05/22/18 [phone]

## 2018-05-22 NOTE — ED Notes (Signed)
Bed: WA07 Expected date:  Expected time:  Means of arrival:  Comments: EMS headache, hypotension

## 2018-05-22 NOTE — ED Provider Notes (Signed)
[facility] DEPT Provider Note   CSN: [id number] Arrival date & time: 05/22/18  1202     History   Chief Complaint Chief Complaint  Patient presents with  . Headache  . Hypotension    HPI LAVI SHEEHAN is a 84 y.o. male.  84 year old male presents with several days of intermittent dizziness that is worse with standing which then is associated with a mild frontal headache.  No associated fever chills neck pain or photophobia.  Seen his doctor for similar symptoms 3 days ago and blood work was reviewed which showed the patient have some evidence of acute kidney injury.  He does have a history of this.  He denies any new medications.  Has not had any volume loss.  Denies any chest or abdominal discomfort.  No focal weakness.  EMS was called when the patient's blood pressure was noted to be 70 in his physician's office.  Was given 1/2 L of saline he feels better at this time.     Past Medical History:  Diagnosis Date  . Arthritis    Degeneration spine & stenosis  . History of kidney stones   . Hypertension    has a histroy of  . Hypotension   . Sleep apnea 2012   used CPAP 2 yrs. ago, feels he sleeps better w/o, no longer using     Patient Active Problem List   Diagnosis Date Noted  . Anemia 09/21/2017  . Symptomatic anemia 09/20/2017  . Protein-calorie malnutrition, severe 09/11/2017  . Empyema lung ([place]) 09/09/2017  . SIRS (systemic inflammatory response syndrome) ([place]) 09/05/2017  . Elevated troponin 09/05/2017  . Normochromic normocytic anemia 09/05/2017  . Hyponatremia 09/05/2017  . Acute kidney injury superimposed on chronic kidney disease ([place]) 09/05/2017  . Chronic pain 09/05/2017  . Pleural effusion, left 09/04/2017  . Pleural effusion on left 08/19/2017  . Sinusitis, chronic 08/19/2017  . Murmur 01/08/2017  . Skin ulcer of toe of left foot, limited to breakdown of skin ([place]) 09/04/2016  . Cervical myelopathy with cervical  radiculopathy 06/24/2014  . LBBB (left bundle branch block) 06/21/2014  . Hyperlipidemia 06/21/2014  . Preoperative cardiovascular examination 06/21/2014  . OBSTRUCTIVE SLEEP APNEA 09/30/2010  . Essential hypertension 08/29/2010    Past Surgical History:  Procedure Laterality Date  . ARM NEUROPLASTY     at 14 yrs. of age- fell off tractor- had fracture & repair *& later- 71's had  transplantation of a nerve at the elbow  . BACK SURGERY     x4 back surgery x2 fusion -  . CARPAL TUNNEL RELEASE Right   . COLONOSCOPY    . ESOPHAGOGASTRODUODENOSCOPY (EGD) WITH PROPOFOL N/A 09/23/2017   Procedure: ESOPHAGOGASTRODUODENOSCOPY (EGD) WITH PROPOFOL;  Surgeon: Laurence Spates, MD;  Location: [facility];  Service: Endoscopy;  Laterality: N/A;  . PLEURAL EFFUSION DRAINAGE Left 09/09/2017   Procedure: DRAINAGE OF PLEURAL EFFUSION;  Surgeon: [name], MD;  Location: [facility];  Service: Thoracic;  Laterality: Left;  . POSTERIOR CERVICAL FUSION/FORAMINOTOMY N/A 06/24/2014   Procedure: Posterior Cervical Three-Seven Fusion with Lateral Mass Fixation;  Surgeon: Erline Levine, MD;  Location: [facility];  Service: Neurosurgery;  Laterality: N/A;  C3-C7 posterior cervical fusion with lateral mass fixation  . SHOULDER SURGERY Bilateral   . VIDEO ASSISTED THORACOSCOPY (VATS)/DECORTICATION Left 09/09/2017   Procedure: VIDEO ASSISTED THORACOSCOPY (VATS)/DECORTICATION;  Surgeon: [name], MD;  Location: [facility];  Service: Thoracic;  Laterality: Left;  Marland Kitchen VIDEO BRONCHOSCOPY  09/09/2017  Procedure: VIDEO BRONCHOSCOPY;  Surgeon: [name], MD;  Location: [facility];  Service: Thoracic;;        Home Medications    Prior to Admission medications   Medication Sig Start Date End Date Taking? Authorizing Provider  b complex vitamins tablet Take 1 tablet by mouth daily.     Yes [provider]  Cholecalciferol (VITAMIN D) 2000 UNITS CAPS Take 2,000 Units by mouth daily.    Yes  [provider]  Ferrous Sulfate (IRON) 325 (65 Fe) MG TABS Take 1 tablet by mouth every other day.    Yes [provider]  finasteride (PROSCAR) 5 MG tablet Take 5 mg by mouth daily.   Yes [provider]  Flaxseed, Linseed, (FLAX SEED OIL) 1000 MG CAPS Take 1,000 mg by mouth daily.    Yes [provider]  fluocinonide cream (LIDEX) 7.90 % Apply 1 application topically 2 (two) times daily.    Yes [provider]  HYDROmorphone (DILAUDID) 2 MG tablet Take 2 mg by mouth 4 (four) times daily as needed for severe pain.   Yes [provider]  L-Lysine 500 MG CAPS Take 500 mg by mouth daily.    Yes [provider]  mineral oil liquid Take 15 mLs by mouth at bedtime.    Yes [provider]  Multiple Vitamin (MULTIVITAMIN WITH MINERALS) TABS tablet Take 1 tablet by mouth daily.   Yes [provider]  nortriptyline (PAMELOR) 25 MG capsule Take 25 mg by mouth at bedtime.    Yes [provider]  polyethylene glycol (MIRALAX / GLYCOLAX) packet Take 17 g by mouth daily. 10/13/17  Yes Sherwood Gambler, MD  pregabalin (LYRICA) 150 MG capsule Take 150 mg by mouth 2 (two) times daily.   Yes [provider]  pyridOXINE (VITAMIN B-6) 100 MG tablet Take 100 mg by mouth daily.     Yes [provider]  saw palmetto 80 MG capsule Take 240 mg by mouth daily.    Yes [provider]  Senna-Psyllium (PERDIEM PO) Take 6 tablets by mouth daily.    Yes [provider]  simvastatin (ZOCOR) 40 MG tablet Take 40 mg by mouth at bedtime.    Yes [provider]  tiZANidine (ZANAFLEX) 2 MG tablet Take 2 mg by mouth 3 (three) times daily.    Yes [provider]  vitamin C (ASCORBIC ACID) 500 MG tablet Take 500 mg by mouth daily.   Yes [provider]  docusate sodium (COLACE) 100 MG capsule Take 300 mg by mouth 3 (three) times daily as needed for mild constipation.     [provider]  magnesium citrate SOLN Use 6.5-10 ounces of magnesium citrate orally once or in a divided dose over 24 hours; mix with 8 ounces liquid Patient not taking: Reported on 05/22/2018 10/13/17   Sherwood Gambler, MD  pantoprazole (PROTONIX) 40 MG tablet Take 1 tablet (40 mg total) by mouth daily. Patient not taking: Reported on 05/22/2018 09/23/17 09/23/18  Caren Griffins, MD    Family History Family History  Problem Relation Age of Onset  . Pancreatic cancer Mother     Social History Social History   Tobacco Use  . Smoking status: Former Smoker    Years: 24.00    Types: Pipe    Last attempt to quit: 09/24/1978    Years since quitting: 39.6  . Smokeless tobacco: Never Used  Substance Use Topics  . Alcohol use: Yes  Comment: 3 drinks/day - gin or vodka   . Drug use: No     Allergies   Diazepam   Review of Systems Review of Systems  All other systems reviewed and are negative.    Physical Exam Updated Vital Signs BP 115/77 (BP Location: Left Arm)   Pulse 80   Temp 98.5 F (36.9 C) (Oral)   Resp 14   Ht 1.778 m (5\' 10" )   Wt 83.5 kg   SpO2 99%   BMI 26.40 kg/m   Physical Exam  Constitutional: He is oriented to person, place, and time. He appears well-developed and well-nourished.  Non-toxic appearance. No distress.  HENT:  Head: Normocephalic and atraumatic.  Eyes: Pupils are equal, round, and reactive to light. Conjunctivae, EOM and lids are normal.  Neck: Normal range of motion. Neck supple. No tracheal deviation present. No thyroid mass present.  Cardiovascular: Normal rate, regular rhythm and normal heart sounds. Exam reveals no gallop.  No murmur heard. Pulmonary/Chest: Effort normal and breath sounds normal. No stridor. No respiratory distress. He has no decreased breath sounds. He has no wheezes. He has no rhonchi. He has no rales.  Abdominal: Soft. Normal appearance and bowel sounds are normal. He exhibits no distension. There is no  tenderness. There is no rebound and no CVA tenderness.  Musculoskeletal: Normal range of motion. He exhibits no edema or tenderness.  Neurological: He is alert and oriented to person, place, and time. He has normal strength. No cranial nerve deficit or sensory deficit. GCS eye subscore is 4. GCS verbal subscore is 5. GCS motor subscore is 6.  Skin: Skin is warm and dry. No abrasion and no rash noted.  Psychiatric: He has a normal mood and affect. His speech is normal and behavior is normal.  Nursing note and vitals reviewed.    ED Treatments / Results  Labs (all labs ordered are listed, but only abnormal results are displayed) Labs Reviewed  CBC WITH DIFFERENTIAL/PLATELET  COMPREHENSIVE METABOLIC PANEL  TROPONIN I    EKG None  Radiology No results found.  Procedures Procedures (including critical care time)  Medications Ordered in ED Medications  0.9 %  sodium chloride infusion (has no administration in time range)  sodium chloride 0.9 % bolus 500 mL (has no administration in time range)  0.9 %  sodium chloride infusion (has no administration in time range)     Initial Impression / Assessment and Plan / ED Course  I have reviewed the triage vital signs and the nursing notes.  Pertinent labs & imaging results that were available during my care of the patient were reviewed by me and considered in my medical decision making (see chart for details).    Patient's last creatinine 3 days ago was 1.46.  Today is 1.78.  Was initially hypotensive but has responded well to fluids.  Still complains of a mild headache and will check head CT.  Will have patient ambulate in the department and if stable will have him follow with his doctor  Final Clinical Impressions(s) / ED Diagnoses   Final diagnoses:  None    ED Discharge Orders    None       Lacretia Leigh, MD 05/22/18 1536

## 2018-05-22 NOTE — Discharge Instructions (Addendum)
Please follow up with your primary care provider  Please call the kidney specialist for follow up  Return to the ER as needed for new or worsening symptoms

## 2018-06-20 ENCOUNTER — Other Ambulatory Visit: Payer: Self-pay

## 2018-06-20 ENCOUNTER — Emergency Department (HOSPITAL_BASED_OUTPATIENT_CLINIC_OR_DEPARTMENT_OTHER)
Admission: EM | Admit: 2018-06-20 | Discharge: 2018-06-20 | Disposition: A | Discharge status: Home / Self Care | Payer: Medicare Other | Attending: Emergency Medicine | Admitting: Emergency Medicine

## 2018-06-20 ENCOUNTER — Emergency Department (HOSPITAL_BASED_OUTPATIENT_CLINIC_OR_DEPARTMENT_OTHER): Payer: Medicare Other

## 2018-06-20 ENCOUNTER — Encounter (HOSPITAL_BASED_OUTPATIENT_CLINIC_OR_DEPARTMENT_OTHER): Payer: Self-pay | Admitting: Emergency Medicine

## 2018-06-20 DIAGNOSIS — Z79899 Other long term (current) drug therapy: Secondary | ICD-10-CM | POA: Insufficient documentation

## 2018-06-20 DIAGNOSIS — Z87891 Personal history of nicotine dependence: Secondary | ICD-10-CM | POA: Insufficient documentation

## 2018-06-20 DIAGNOSIS — I10 Essential (primary) hypertension: Secondary | ICD-10-CM | POA: Insufficient documentation

## 2018-06-20 DIAGNOSIS — M25571 Pain in right ankle and joints of right foot: Secondary | ICD-10-CM | POA: Insufficient documentation

## 2018-06-20 NOTE — Discharge Instructions (Signed)
Return if any problems. See your Physician for recheck next week  

## 2018-06-20 NOTE — ED Notes (Signed)
Pt states he tripped over an ottoman last saturday

## 2018-06-20 NOTE — ED Notes (Signed)
Patient transported to X-ray via stretcher, sr x 2 up

## 2018-06-20 NOTE — ED Notes (Signed)
Pt presents with pain at rt foot/ ankle area, pt placed on stretcher, shoe and sock removed, rt foot and ankle swelling immediately noted. Some bruising areas also noted. Rt pedal pulse easily palpated 3+ on 0-4 scale. Pt states he is not having a lot of pain

## 2018-06-20 NOTE — ED Notes (Signed)
PMS intact before and after ASO applied. RICE explained. NO further questions.

## 2018-06-20 NOTE — ED Notes (Signed)
Pt complains of minimal pain to his right. Pt reports Dr. told him nothing is broke.

## 2018-06-20 NOTE — ED Triage Notes (Signed)
Reports fall with right ankle injury 1 week ago.  Went to PCP and referred to ER due xray not working.

## 2018-06-20 NOTE — ED Notes (Signed)
Strong plantar and dorsal flexion noted of Rt foot. Foot elevated, ice pack applied for comfort

## 2018-06-20 NOTE — ED Notes (Signed)
Pt returns from radiology. 

## 2018-06-21 NOTE — ED Provider Notes (Signed)
[facility] EMERGENCY DEPARTMENT Provider Note   CSN: [id number] Arrival date & time: 06/20/18  1708     History   Chief Complaint Chief Complaint  Patient presents with  . Ankle Injury    HPI Aaron Schmidt is a 84 y.o. male.  The history is provided by the patient. No language interpreter was used.  Ankle Injury  This is a new problem. The current episode started more than 1 week ago. The problem occurs constantly. The problem has been gradually worsening. Nothing aggravates the symptoms. Nothing relieves the symptoms. He has tried nothing for the symptoms. The treatment provided no relief.  Pt complains of pain in his ankle from turning a week ago   Past Medical History:  Diagnosis Date  . Arthritis    Degeneration spine & stenosis  . History of kidney stones   . Hypertension    has a histroy of  . Hypotension   . Sleep apnea 2012   used CPAP 2 yrs. ago, feels he sleeps better w/o, no longer using     Patient Active Problem List   Diagnosis Date Noted  . Anemia 09/21/2017  . Symptomatic anemia 09/20/2017  . Protein-calorie malnutrition, severe 09/11/2017  . Empyema lung ([place]) 09/09/2017  . SIRS (systemic inflammatory response syndrome) ([place]) 09/05/2017  . Elevated troponin 09/05/2017  . Normochromic normocytic anemia 09/05/2017  . Hyponatremia 09/05/2017  . Acute kidney injury superimposed on chronic kidney disease ([place]) 09/05/2017  . Chronic pain 09/05/2017  . Pleural effusion, left 09/04/2017  . Pleural effusion on left 08/19/2017  . Sinusitis, chronic 08/19/2017  . Murmur 01/08/2017  . Skin ulcer of toe of left foot, limited to breakdown of skin ([place]) 09/04/2016  . Cervical myelopathy with cervical radiculopathy 06/24/2014  . LBBB (left bundle branch block) 06/21/2014  . Hyperlipidemia 06/21/2014  . Preoperative cardiovascular examination 06/21/2014  . OBSTRUCTIVE SLEEP APNEA 09/30/2010  . Essential hypertension 08/29/2010    Past Surgical  History:  Procedure Laterality Date  . ARM NEUROPLASTY     at 14 yrs. of age- fell off tractor- had fracture & repair *& later- 70's had  transplantation of a nerve at the elbow  . BACK SURGERY     x4 back surgery x2 fusion -  . CARPAL TUNNEL RELEASE Right   . COLONOSCOPY    . ESOPHAGOGASTRODUODENOSCOPY (EGD) WITH PROPOFOL N/A 09/23/2017   Procedure: ESOPHAGOGASTRODUODENOSCOPY (EGD) WITH PROPOFOL;  Surgeon: Laurence Spates, MD;  Location: [facility];  Service: Endoscopy;  Laterality: N/A;  . PLEURAL EFFUSION DRAINAGE Left 09/09/2017   Procedure: DRAINAGE OF PLEURAL EFFUSION;  Surgeon: [name], MD;  Location: [facility];  Service: Thoracic;  Laterality: Left;  . POSTERIOR CERVICAL FUSION/FORAMINOTOMY N/A 06/24/2014   Procedure: Posterior Cervical Three-Seven Fusion with Lateral Mass Fixation;  Surgeon: Erline Levine, MD;  Location: [facility];  Service: Neurosurgery;  Laterality: N/A;  C3-C7 posterior cervical fusion with lateral mass fixation  . SHOULDER SURGERY Bilateral   . VIDEO ASSISTED THORACOSCOPY (VATS)/DECORTICATION Left 09/09/2017   Procedure: VIDEO ASSISTED THORACOSCOPY (VATS)/DECORTICATION;  Surgeon: [name], MD;  Location: [facility];  Service: Thoracic;  Laterality: Left;  Marland Kitchen VIDEO BRONCHOSCOPY  09/09/2017   Procedure: VIDEO BRONCHOSCOPY;  Surgeon: [name], MD;  Location: [facility];  Service: Thoracic;;        Home Medications    Prior to Admission medications   Medication Sig Start Date End Date Taking? Authorizing Provider  b complex vitamins tablet Take 1 tablet  by mouth daily.     Yes [provider]  Cholecalciferol (VITAMIN D) 2000 UNITS CAPS Take 2,000 Units by mouth daily.    Yes [provider]  docusate sodium (COLACE) 100 MG capsule Take 300 mg by mouth 3 (three) times daily as needed for mild constipation.    Yes [provider]  Ferrous Sulfate (IRON) 325 (65 Fe) MG TABS Take 1 tablet by mouth every  other day.    Yes [provider]  finasteride (PROSCAR) 5 MG tablet Take 5 mg by mouth daily.   Yes [provider]  Flaxseed, Linseed, (FLAX SEED OIL) 1000 MG CAPS Take 1,000 mg by mouth daily.    Yes [provider]  fluocinonide cream (LIDEX) 5.36 % Apply 1 application topically 2 (two) times daily.    Yes [provider]  HYDROmorphone (DILAUDID) 2 MG tablet Take 2 mg by mouth 4 (four) times daily as needed for severe pain.   Yes [provider]  L-Lysine 500 MG CAPS Take 500 mg by mouth daily.    Yes [provider]  mineral oil liquid Take 15 mLs by mouth at bedtime.    Yes [provider]  Multiple Vitamin (MULTIVITAMIN WITH MINERALS) TABS tablet Take 1 tablet by mouth daily.   Yes [provider]  nortriptyline (PAMELOR) 25 MG capsule Take 25 mg by mouth at bedtime.    Yes [provider]  polyethylene glycol (MIRALAX / GLYCOLAX) packet Take 17 g by mouth daily. 10/13/17  Yes Sherwood Gambler, MD  pregabalin (LYRICA) 150 MG capsule Take 150 mg by mouth 2 (two) times daily.   Yes [provider]  pyridOXINE (VITAMIN B-6) 100 MG tablet Take 100 mg by mouth daily.     Yes [provider]  saw palmetto 80 MG capsule Take 240 mg by mouth daily.    Yes [provider]  Senna-Psyllium (PERDIEM PO) Take 6 tablets by mouth daily.    Yes [provider]  simvastatin (ZOCOR) 40 MG tablet Take 40 mg by mouth at bedtime.    Yes [provider]  tiZANidine (ZANAFLEX) 2 MG tablet Take 2 mg by mouth 3 (three) times daily.    Yes [provider]  vitamin C (ASCORBIC ACID) 500 MG tablet Take 500 mg by mouth daily.   Yes [provider]    Family History Family History  Problem Relation Age of Onset  . Pancreatic cancer Mother     Social History Social History   Tobacco Use  . Smoking status: Former Smoker    Years: 24.00    Types: Pipe    Last attempt to  quit: 09/24/1978    Years since quitting: 39.7  . Smokeless tobacco: Never Used  Substance Use Topics  . Alcohol use: Yes    Comment: 3 drinks/day - gin or vodka   . Drug use: No     Allergies   Diazepam   Review of Systems Review of Systems  All other systems reviewed and are negative.    Physical Exam Updated Vital Signs BP (!) 165/91 (BP Location: Left Arm)   Pulse 85   Temp 98.3 F (36.8 C) (Oral)   Resp 16   Ht 5' 10.5" (1.791 m)   Wt 77.1 kg   SpO2 99%   BMI 24.05 kg/m   Physical Exam  Constitutional: He appears well-developed and well-nourished.  Musculoskeletal: He exhibits tenderness.  Bruised swollen ankle. Pain with movement,  nv  and ns intact   Neurological: He is alert.  Skin: Skin is warm.  Psychiatric: He has a normal mood and affect.  Nursing note and vitals reviewed.    ED Treatments / Results  Labs (all labs ordered are listed, but only abnormal results are displayed) Labs Reviewed - No data to display  EKG None  Radiology Dg Ankle Complete Right  Result Date: 06/20/2018 CLINICAL DATA:  Status post fall from the recliner.  Pain. EXAM: RIGHT ANKLE - COMPLETE 3+ VIEW COMPARISON:  None. FINDINGS: There is no evidence of fracture, dislocation, or joint effusion. There is no evidence of arthropathy or other focal bone abnormality. There is soft tissue swelling over the medial and lateral malleoli. There is a small plantar calcaneal spur. IMPRESSION: No acute osseous injury of the right ankle. Electronically Signed   By: Kathreen Devoid   On: 06/20/2018 18:27   Dg Foot Complete Right  Result Date: 06/20/2018 CLINICAL DATA:  Status post fall.  Right ankle and foot pain EXAM: RIGHT FOOT COMPLETE - 3+ VIEW COMPARISON:  None. FINDINGS: There is deformity at the base of second, third and fourth proximal phalanx likely reflecting sequela of prior trauma, but recommend correlation with point tenderness. There is no other fracture or dislocation. There is  mild osteoarthritis of the first MTP joint. There is a small plantar calcaneal spur. Soft tissues are unremarkable. IMPRESSION: Deformity at the base of second, third and fourth proximal phalanx likely reflecting sequela of prior trauma, but recommend correlation with point tenderness. No other fracture or dislocation. Electronically Signed   By: Kathreen Devoid   On: 06/20/2018 18:27    Procedures Procedures (including critical care time)  Medications Ordered in ED Medications - No data to display   Initial Impression / Assessment and Plan / ED Course  I have reviewed the triage vital signs and the nursing notes.  Pertinent labs & imaging results that were available during my care of the patient were reviewed by me and considered in my medical decision making (see chart for details).     MDM  Xrays negative,  I reviewed with pt. Pt placed in aso.  Pt advised to use walker, decrease weight bearing  Final Clinical Impressions(s) / ED Diagnoses   Final diagnoses:  Acute right ankle pain    ED Discharge Orders    None    An After Visit Summary was printed and given to the patient.    Fransico Meadow, [place] 06/21/18 0114    Little, Wenda Overland, MD 06/21/18 [phone]

## 2018-06-30 ENCOUNTER — Other Ambulatory Visit: Payer: Self-pay | Admitting: Nephrology

## 2018-06-30 DIAGNOSIS — N183 Chronic kidney disease, stage 3 unspecified: Secondary | ICD-10-CM

## 2018-07-02 ENCOUNTER — Ambulatory Visit
Admission: RE | Admit: 2018-07-02 | Discharge: 2018-07-02 | Disposition: A | Discharge status: Home / Self Care | Payer: Medicare Other | Source: Ambulatory Visit | Attending: Nephrology | Admitting: Nephrology

## 2018-07-02 DIAGNOSIS — N183 Chronic kidney disease, stage 3 unspecified: Secondary | ICD-10-CM

## 2018-11-25 ENCOUNTER — Other Ambulatory Visit: Payer: Self-pay | Admitting: *Deleted

## 2018-11-25 DIAGNOSIS — R911 Solitary pulmonary nodule: Secondary | ICD-10-CM

## 2018-12-24 ENCOUNTER — Other Ambulatory Visit: Payer: Self-pay | Admitting: Nephrology

## 2018-12-24 DIAGNOSIS — N183 Chronic kidney disease, stage 3 unspecified: Secondary | ICD-10-CM

## 2018-12-31 ENCOUNTER — Other Ambulatory Visit: Payer: Medicare Other

## 2019-01-06 ENCOUNTER — Ambulatory Visit: Payer: Medicare Other | Admitting: Thoracic Surgery (Cardiothoracic Vascular Surgery)

## 2019-02-05 ENCOUNTER — Other Ambulatory Visit: Payer: Medicare Other

## 2019-02-10 ENCOUNTER — Ambulatory Visit: Payer: Medicare Other | Admitting: Thoracic Surgery (Cardiothoracic Vascular Surgery)

## 2019-02-10 ENCOUNTER — Other Ambulatory Visit: Payer: Self-pay

## 2019-02-10 ENCOUNTER — Ambulatory Visit
Admission: RE | Admit: 2019-02-10 | Discharge: 2019-02-10 | Disposition: A | Discharge status: Home / Self Care | Payer: Medicare Other | Source: Ambulatory Visit | Attending: Thoracic Surgery (Cardiothoracic Vascular Surgery) | Admitting: Thoracic Surgery (Cardiothoracic Vascular Surgery)

## 2019-02-10 ENCOUNTER — Other Ambulatory Visit: Payer: Medicare Other

## 2019-02-10 ENCOUNTER — Encounter: Payer: Self-pay | Admitting: Thoracic Surgery (Cardiothoracic Vascular Surgery)

## 2019-02-10 VITALS — BP 150/72 | HR 85 | Temp 98.3°F | Resp 16 | Ht 70.5 in | Wt 171.0 lb

## 2019-02-10 DIAGNOSIS — R911 Solitary pulmonary nodule: Secondary | ICD-10-CM

## 2019-02-10 DIAGNOSIS — Z09 Encounter for follow-up examination after completed treatment for conditions other than malignant neoplasm: Secondary | ICD-10-CM | POA: Diagnosis not present

## 2019-02-10 DIAGNOSIS — J869 Pyothorax without fistula: Secondary | ICD-10-CM | POA: Diagnosis not present

## 2019-02-10 NOTE — Progress Notes (Signed)
[address]       ,[place] [address]             [phone]     HPI: [name] returns for scheduled follow-up visit  Aaron Schmidt is an 85 year old gentleman with a past medical history significant for chronic back pain, hypertension, BPH, sleep apnea, and a left empyema.  He has remote history of tobacco abuse smoking a pipe before quitting in 1980.  He presented in November 2018 with pneumonia and empyema.  I did a left VATS and decortication on 09/09/2017.  He had multiple small nodules noted on CT at that time.  A follow-up CT in April showed no change.  Since his last visit he has been doing well.  He has been isolated at home due to [place].  He has not had any respiratory issues.  He is a little anxious having to wear a mask.  He has a history of hypertension but has not been on medication recently.  He does not have any residual effects from his surgery.  Past Medical History:  Diagnosis Date  . Arthritis    Degeneration spine & stenosis  . History of kidney stones   . Hypertension    has a histroy of  . Hypotension   . Sleep apnea 2012   used CPAP 2 yrs. ago, feels he sleeps better w/o, no longer using     Current Outpatient Medications  Medication Sig Dispense Refill  . b complex vitamins tablet Take 1 tablet by mouth daily.      . Cholecalciferol (VITAMIN D) 2000 UNITS CAPS Take 2,000 Units by mouth daily.     Marland Kitchen docusate sodium (COLACE) 100 MG capsule Take 300 mg by mouth 3 (three) times daily as needed for mild constipation.     . Ferrous Sulfate (IRON) 325 (65 Fe) MG TABS Take 1 tablet by mouth every other day.     . finasteride (PROSCAR) 5 MG tablet Take 5 mg by mouth daily.    . Flaxseed, Linseed, (FLAX SEED OIL) 1000 MG CAPS Take 1,000 mg by mouth daily.     . fluocinonide cream (LIDEX) 4.03 % Apply 1 application topically 2 (two) times daily.     Marland Kitchen HYDROmorphone (DILAUDID) 2 MG tablet Take 2 mg by mouth 4 (four) times daily as needed for  severe pain.    Marland Kitchen L-Lysine 500 MG CAPS Take 500 mg by mouth daily.     . mineral oil liquid Take 15 mLs by mouth at bedtime.     . Multiple Vitamin (MULTIVITAMIN WITH MINERALS) TABS tablet Take 1 tablet by mouth daily.    . nortriptyline (PAMELOR) 25 MG capsule Take 25 mg by mouth at bedtime.     . polyethylene glycol (MIRALAX / GLYCOLAX) packet Take 17 g by mouth daily. 14 each 0  . pregabalin (LYRICA) 150 MG capsule Take 150 mg by mouth 2 (two) times daily.    Marland Kitchen pyridOXINE (VITAMIN B-6) 100 MG tablet Take 100 mg by mouth daily.      . saw palmetto 80 MG capsule Take 240 mg by mouth daily.     . Senna-Psyllium (PERDIEM PO) Take 6 tablets by mouth daily.     . simvastatin (ZOCOR) 40 MG tablet Take 40 mg by mouth at bedtime.     Marland Kitchen tiZANidine (ZANAFLEX) 2 MG tablet Take 2 mg by mouth 3 (three) times daily.     . vitamin C (ASCORBIC  ACID) 500 MG tablet Take 500 mg by mouth daily.     No current facility-administered medications for this visit.     Physical Exam BP (!) 150/72 (BP Location: Right Arm, Patient Position: Sitting, Cuff Size: Normal)   Pulse 85   Temp 98.3 F (36.8 C) (Oral)   Resp 16   Ht 5' 10.5" (1.791 m)   Wt 171 lb (77.6 kg)   SpO2 97% Comment: RA  BMI 24.54 kg/m  85 year old man in no acute distress Alert and oriented x3 with no focal deficits Lungs slightly diminished at left base, otherwise clear Cardiac regular rate and rhythm  Diagnostic Tests: CT CHEST WITHOUT CONTRAST  TECHNIQUE: Multidetector CT imaging of the chest was performed following the standard protocol without IV contrast.  COMPARISON:  12/31/2017  FINDINGS: Cardiovascular: Normal heart size. No pericardial effusion. Aortic atherosclerosis.  Mediastinum/Nodes: Normal appearance of the thyroid gland. The trachea appears patent and is midline. No enlarged axillary or supraclavicular adenopathy. No mediastinal adenopathy.  Lungs/Pleura: Postoperative change, scarring and volume loss  within the left lung is again noted and appears stable from previous exam. Left a focal pleuroparenchymal scarring is unchanged. A few patchy areas of ground-glass attenuation are identified within the central right middle lobe and right lower lobe. Multiple tiny pulmonary nodules are again identified within both lungs. The largest is in the posterior right lower lobe measuring 0.2 x 0.7 cm. These are unchanged when compared with 12/31/2017. Scattered calcified granulomas also noted.  Upper Abdomen: No acute abnormality.  Musculoskeletal: No aggressive lytic or sclerotic bone lesions.  IMPRESSION: 1. Stable exam. 2. Stable appearance of the left lung status post left-sided empyema with residual pleuroparenchymal scarring and calcifications. 3. Multiple calcified and noncalcified pulmonary nodules are unchanged from previous exam. No additional follow-up indicated at this time. This recommendation follows the consensus statement: Guidelines for Management of Incidental Pulmonary Nodules Detected on CT Images: From the Fleischner Society 2017; Radiology 2017; 284:228-243. 4.  Aortic Atherosclerosis (ICD10-I70.0).   Electronically Signed   By: Kerby Moors M.D.   On: 02/10/2019 08:43 I personally reviewed the CT images and concur with the findings noted above.  Impression: Aaron Schmidt is an 85 year old man who underwent a left VATS for decortication for empyema in December 2018.  Lung nodules-multiple small nodules.  Stable over time.  No further follow-up indicated.  Hypertension-blood pressure elevated today.  He has not been on medication recently.  He does have a cuff at home.  He will check himself at home and make sure that his blood pressure is less than 140/90.  Aortic atherosclerosis-noted on CT. on Zocor.  Plan: I will be happy to see Aaron Schmidt back anytime in the future if I can be of any further assistance with his care  [name], MD [facility] [phone]

## 2019-02-23 ENCOUNTER — Other Ambulatory Visit: Payer: Medicare Other

## 2019-02-25 ENCOUNTER — Ambulatory Visit
Admission: RE | Admit: 2019-02-25 | Discharge: 2019-02-25 | Disposition: A | Discharge status: Home / Self Care | Payer: Medicare Other | Source: Ambulatory Visit | Attending: Nephrology | Admitting: Nephrology

## 2019-02-25 ENCOUNTER — Other Ambulatory Visit: Payer: Self-pay

## 2019-02-25 DIAGNOSIS — N183 Chronic kidney disease, stage 3 unspecified: Secondary | ICD-10-CM

## 2019-03-11 IMAGING — DX DG CHEST 2V
3 series · 3 of 3 positions shown · non-contrast
Comparison: 09/04/2017 and prior chest radiographs

CLINICAL DATA: 84-year-old male with increasing shortness of breath

EXAM:
CHEST  2 VIEW

[x chest ap (1 of 2)]
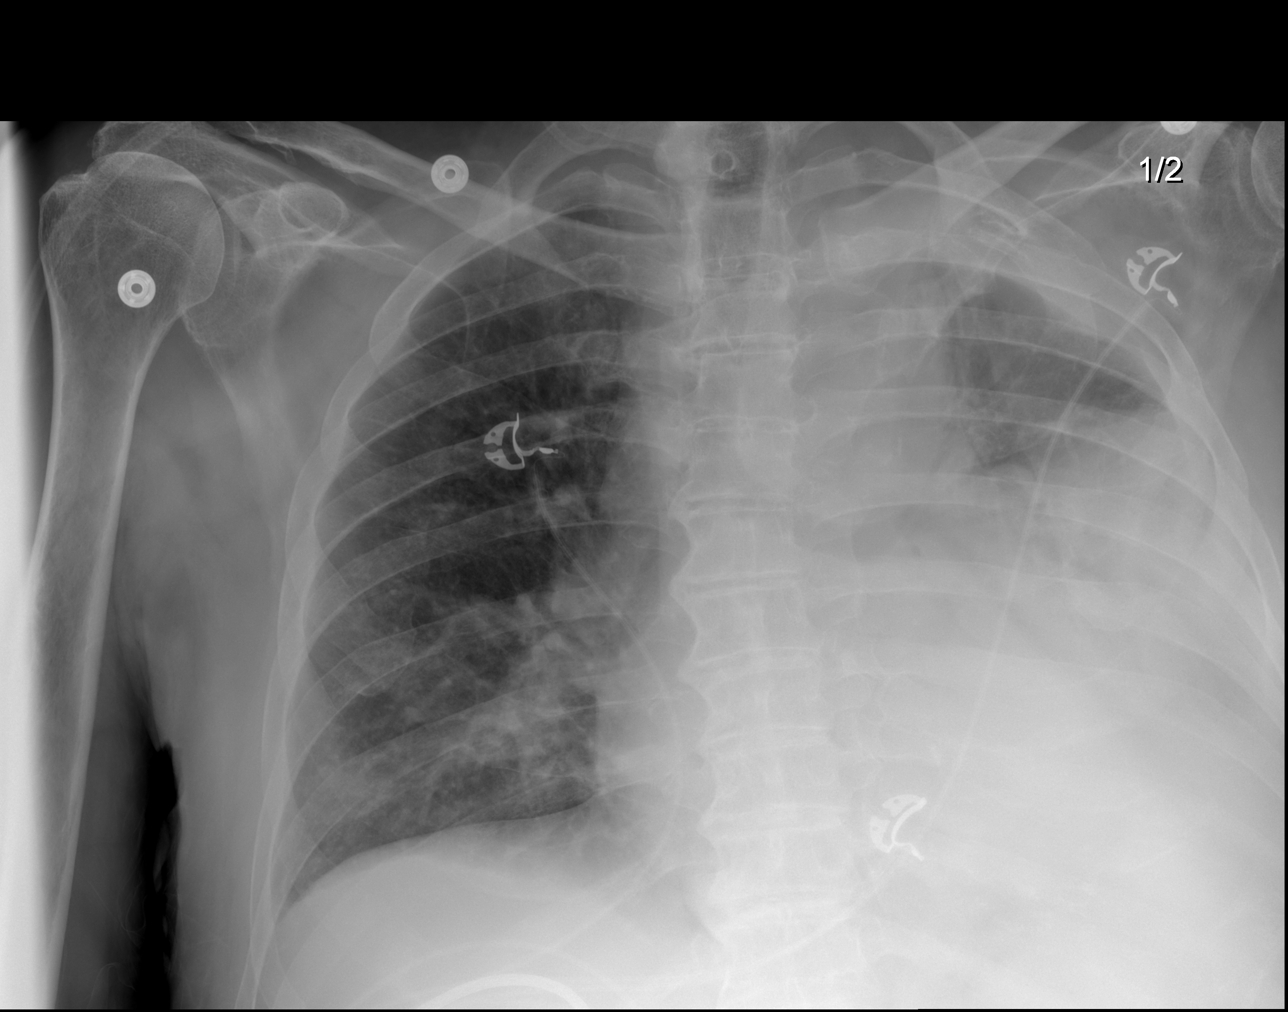

[x chest ap (2 of 2)]
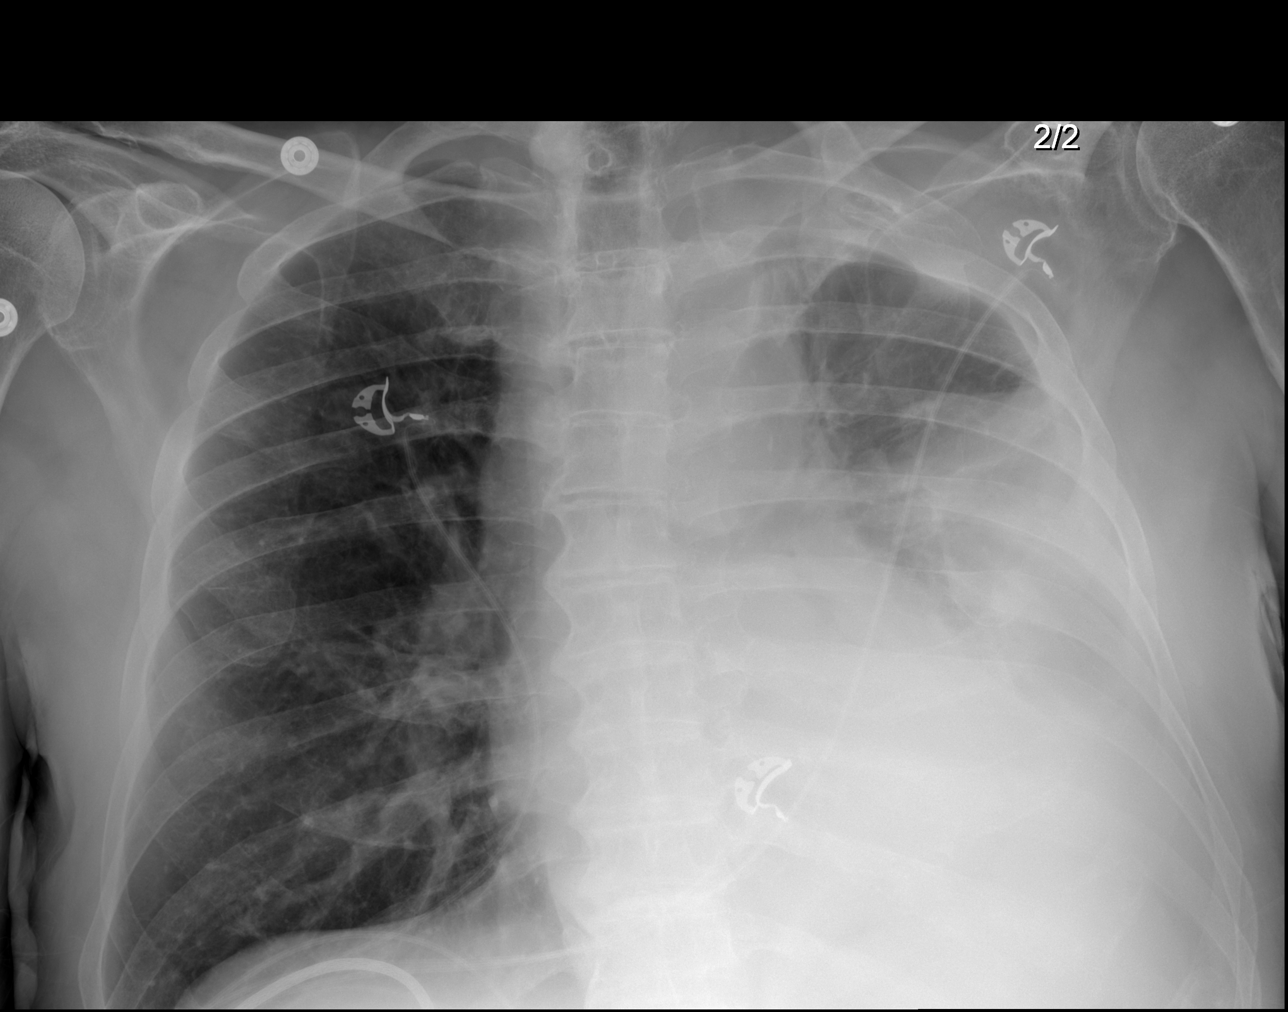

[w chest lat]
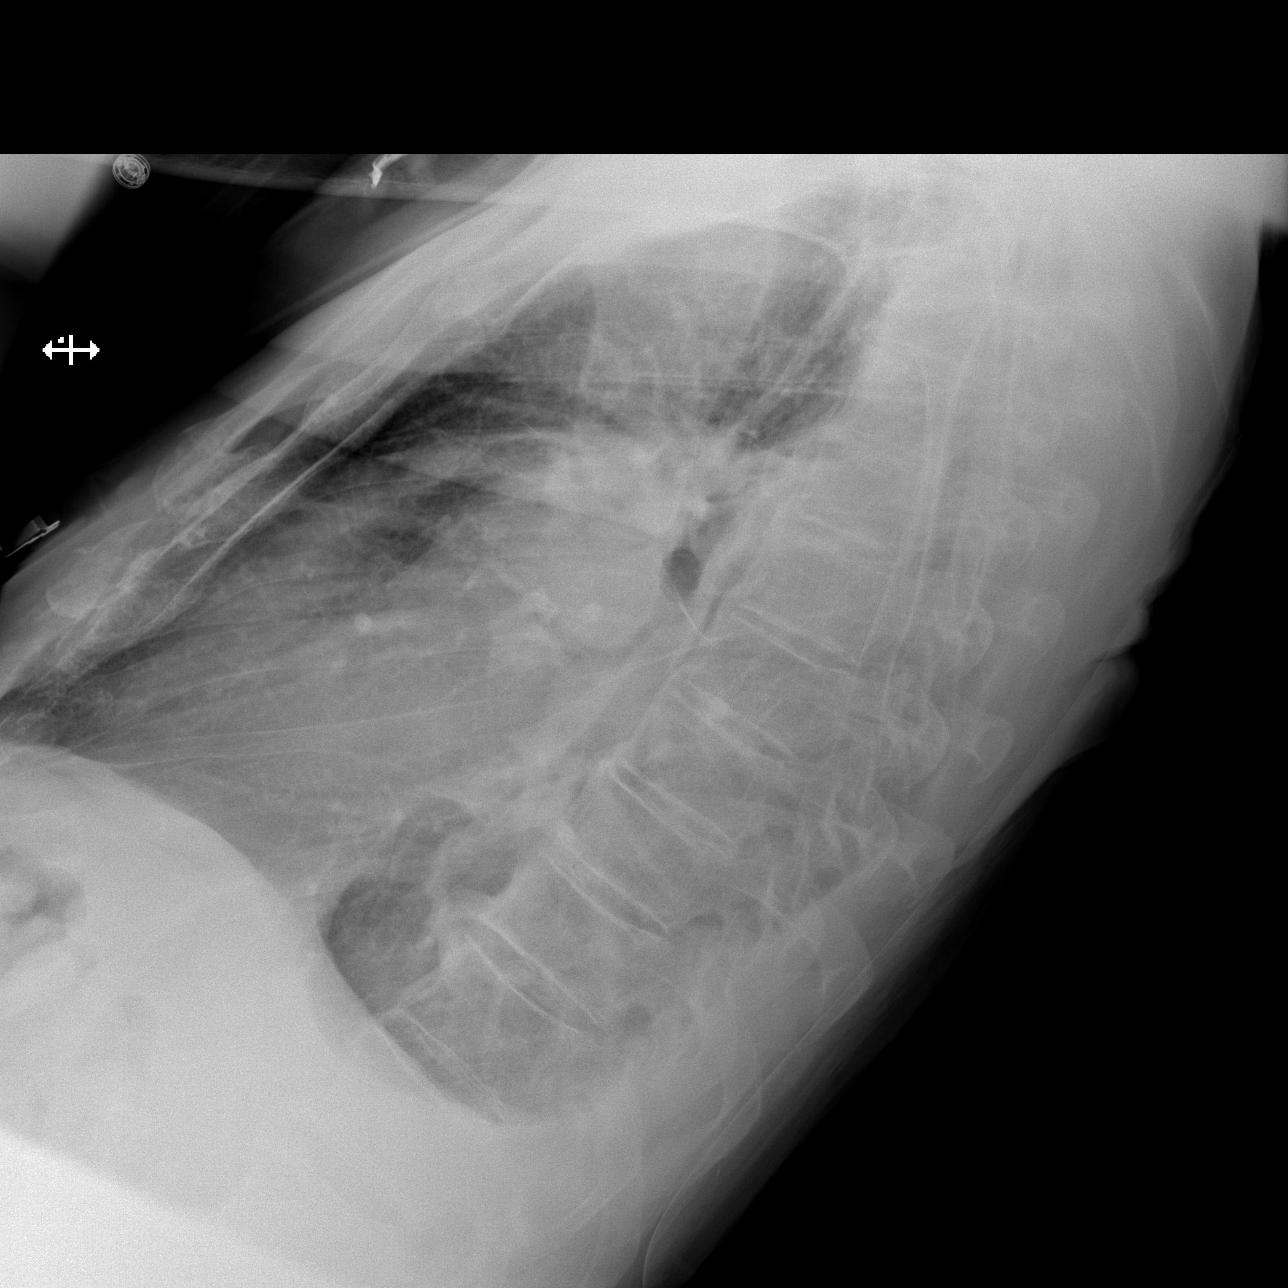

[3 of 3 positions shown; findings below may reference images not displayed]

FINDINGS: Increasing left pleural effusion and left lung atelectasis/airspace
disease noted.

There is no evidence of pneumothorax.

A small right pleural effusion is present.

Minimal right basilar atelectasis identified.

No acute bony abnormalities are identified.
IMPRESSION: 1. Increasing left pleural effusion and left lung atelectasis/
airspace disease.
2. Small right pleural effusion and minimal right basilar
atelectasis.

## 2019-03-12 IMAGING — DX DG CHEST 1V PORT
1 series · 1 of 1 positions shown · non-contrast
Comparison: 09/09/2017.

CLINICAL DATA: Chest tube.  Sore chest.  Shortness of breath .

EXAM:
PORTABLE CHEST 1 VIEW

[chest ap]
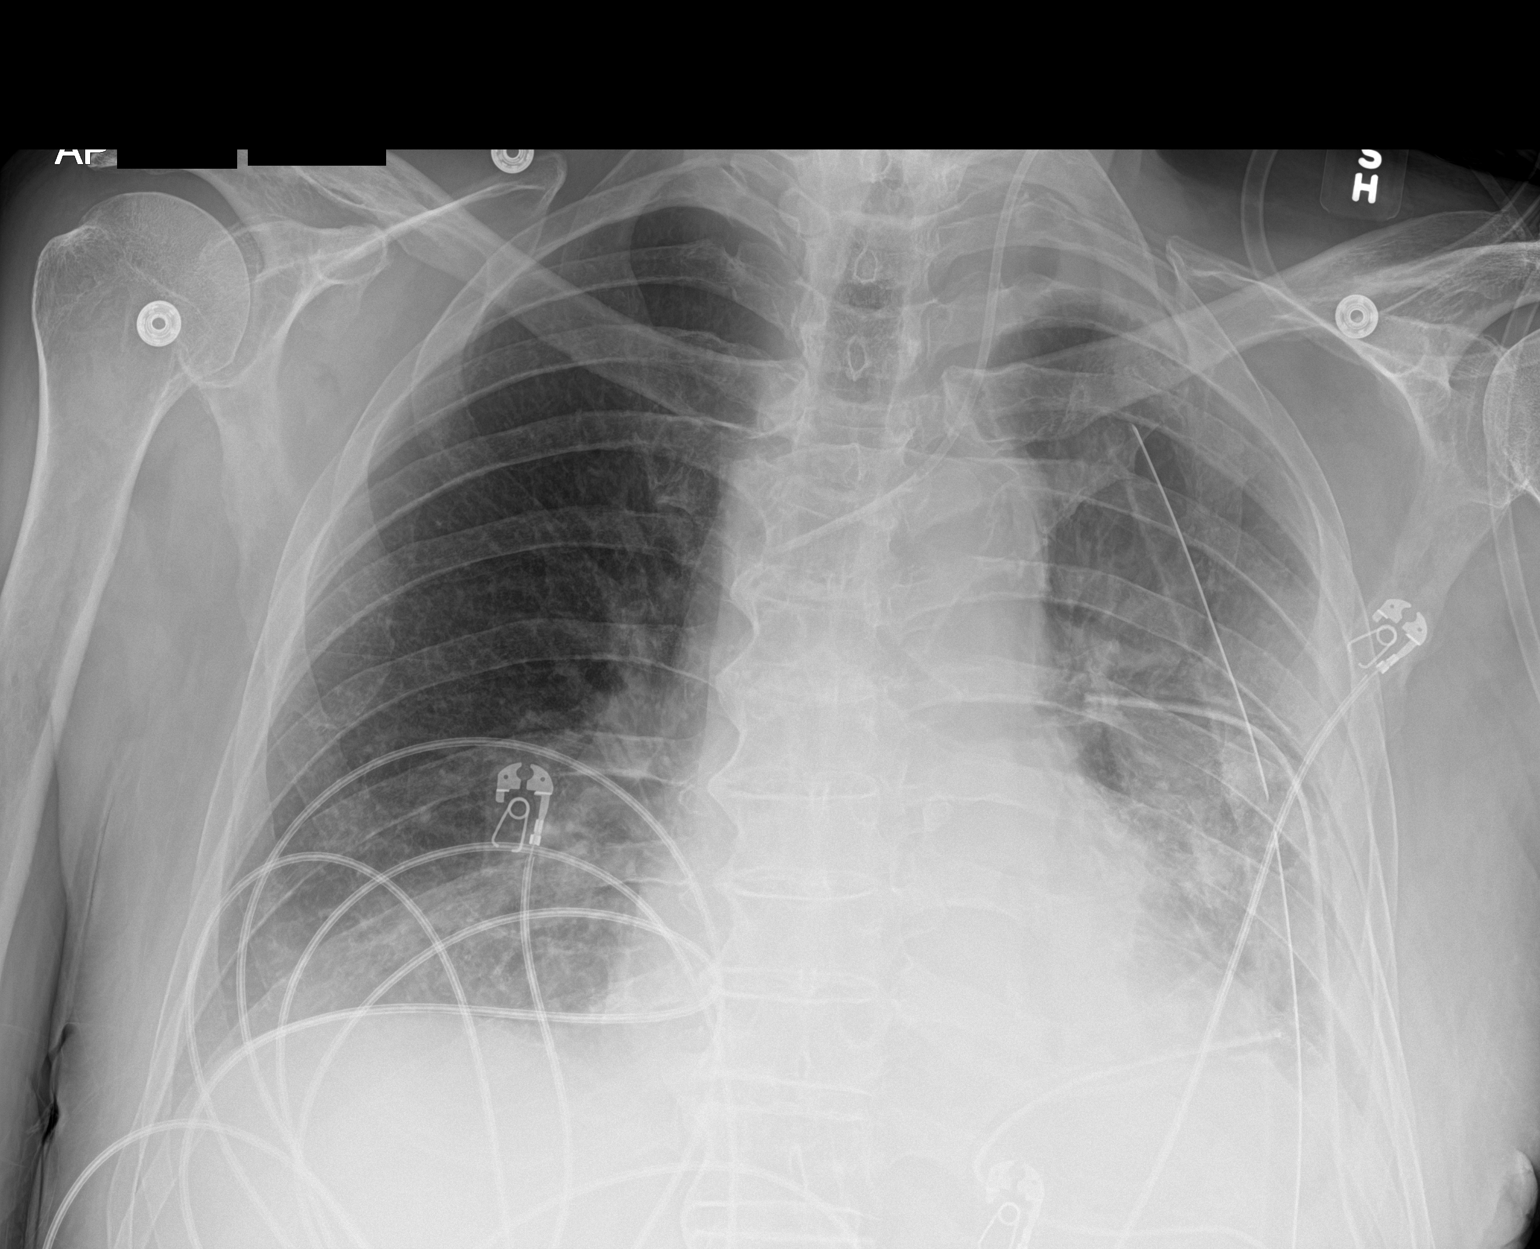

[1 of 1 positions shown; findings below may reference images not displayed]

FINDINGS: Left IJ line, left chest tubes in stable position. Heart size
stable. Bilateral pulmonary infiltrates and left-sided pleural
effusion. No pneumothorax. Cervical spine fusion.
IMPRESSION: 1. Left IJ line and left chest tubes in stable position. No
pneumothorax.

2.  Bibasilar pulmonary infiltrates and bilateral pleural effusions.

3. Stable cardiomegaly.

## 2019-03-13 IMAGING — DX DG CHEST 1V PORT
1 series · 1 of 1 positions shown · non-contrast
Comparison: 09/10/2017

CLINICAL DATA: Followup chest tube.  Empyema.

EXAM:
PORTABLE CHEST 1 VIEW

[chest ap]
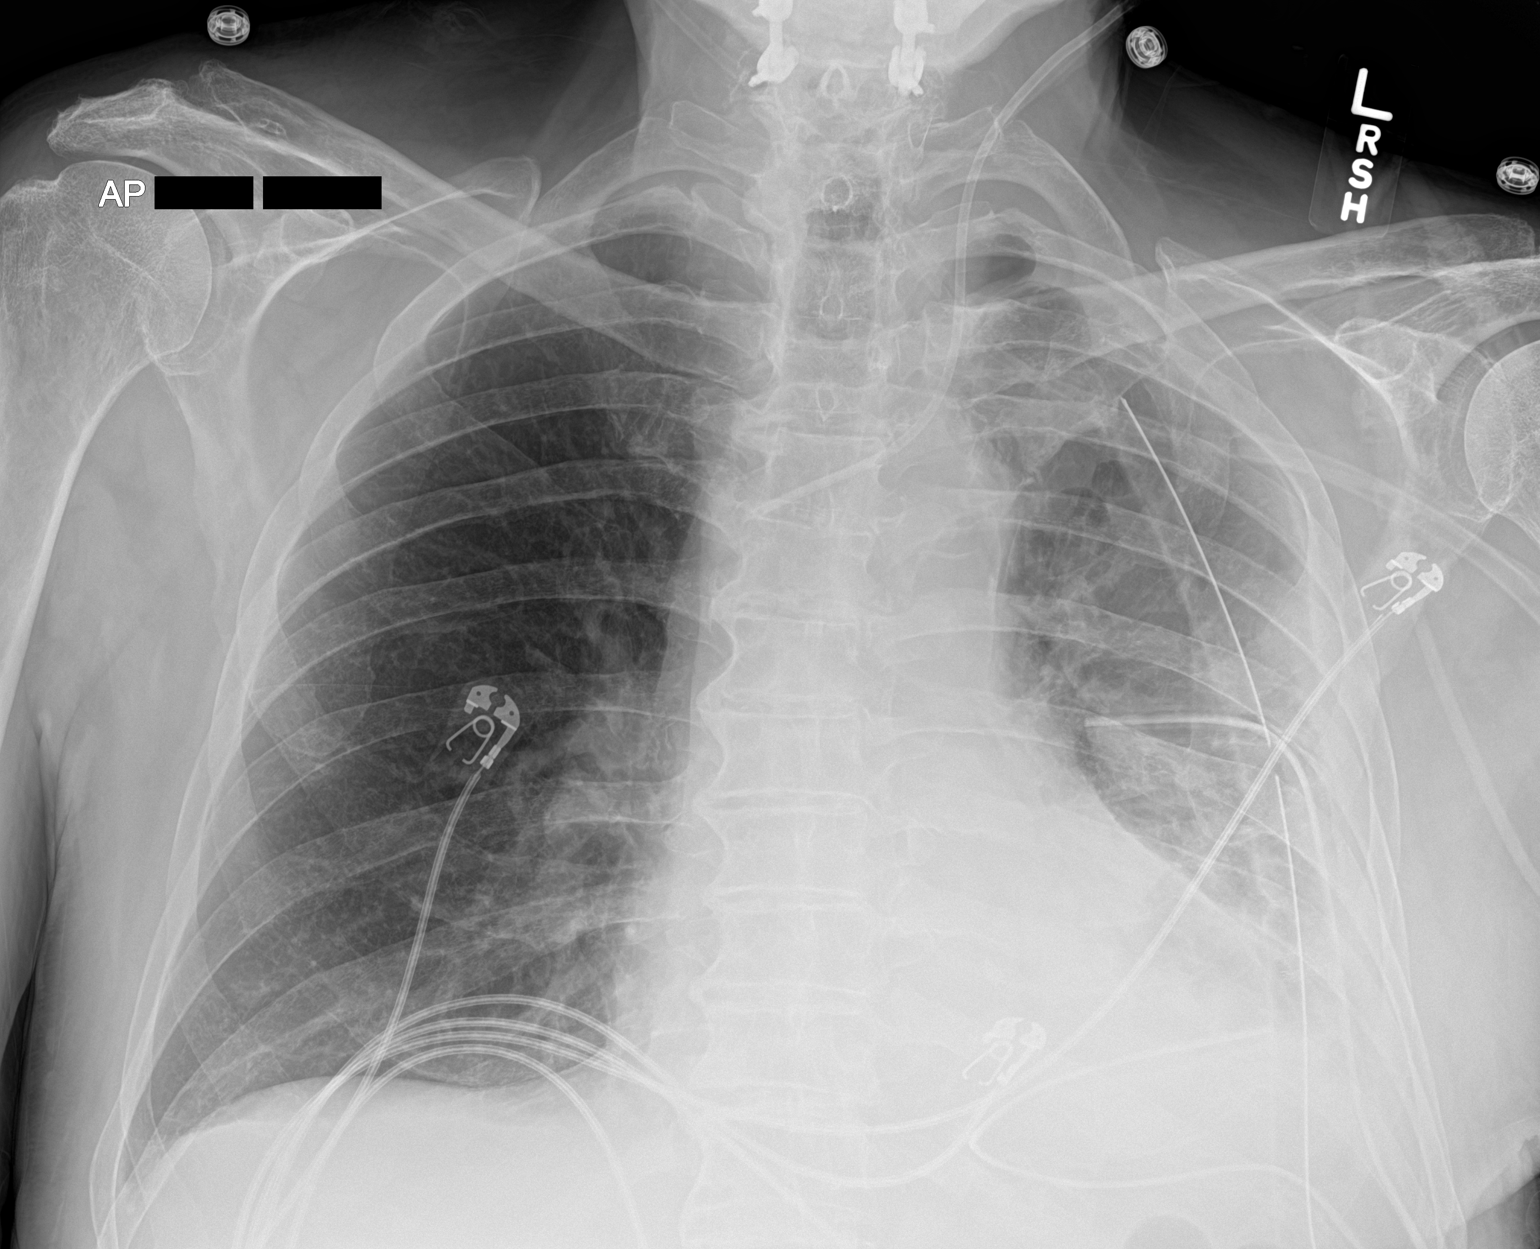

[1 of 1 positions shown; findings below may reference images not displayed]

FINDINGS: Multiple left chest tubes remain in place. Left internal jugular
central line remains with tip at the azygos level. Right chest
remains clear. Persistent pleural and parenchymal density on the
left, with slightly less pleural density. Possible bulb bowl of
pleural air at the left apex. No significant pneumothorax.
IMPRESSION: Less pleural density on the left. Question bubble of air at the left
pleural apex. No significant pneumothorax.

## 2019-03-16 IMAGING — DX DG CHEST 2V
2 series · 2 of 2 positions shown · non-contrast
Comparison: 09/13/2017

CLINICAL DATA: S/p VATS 09/09/17; hx of HTN; former pipe smoker

EXAM:
CHEST  2 VIEW

[x chest ap]
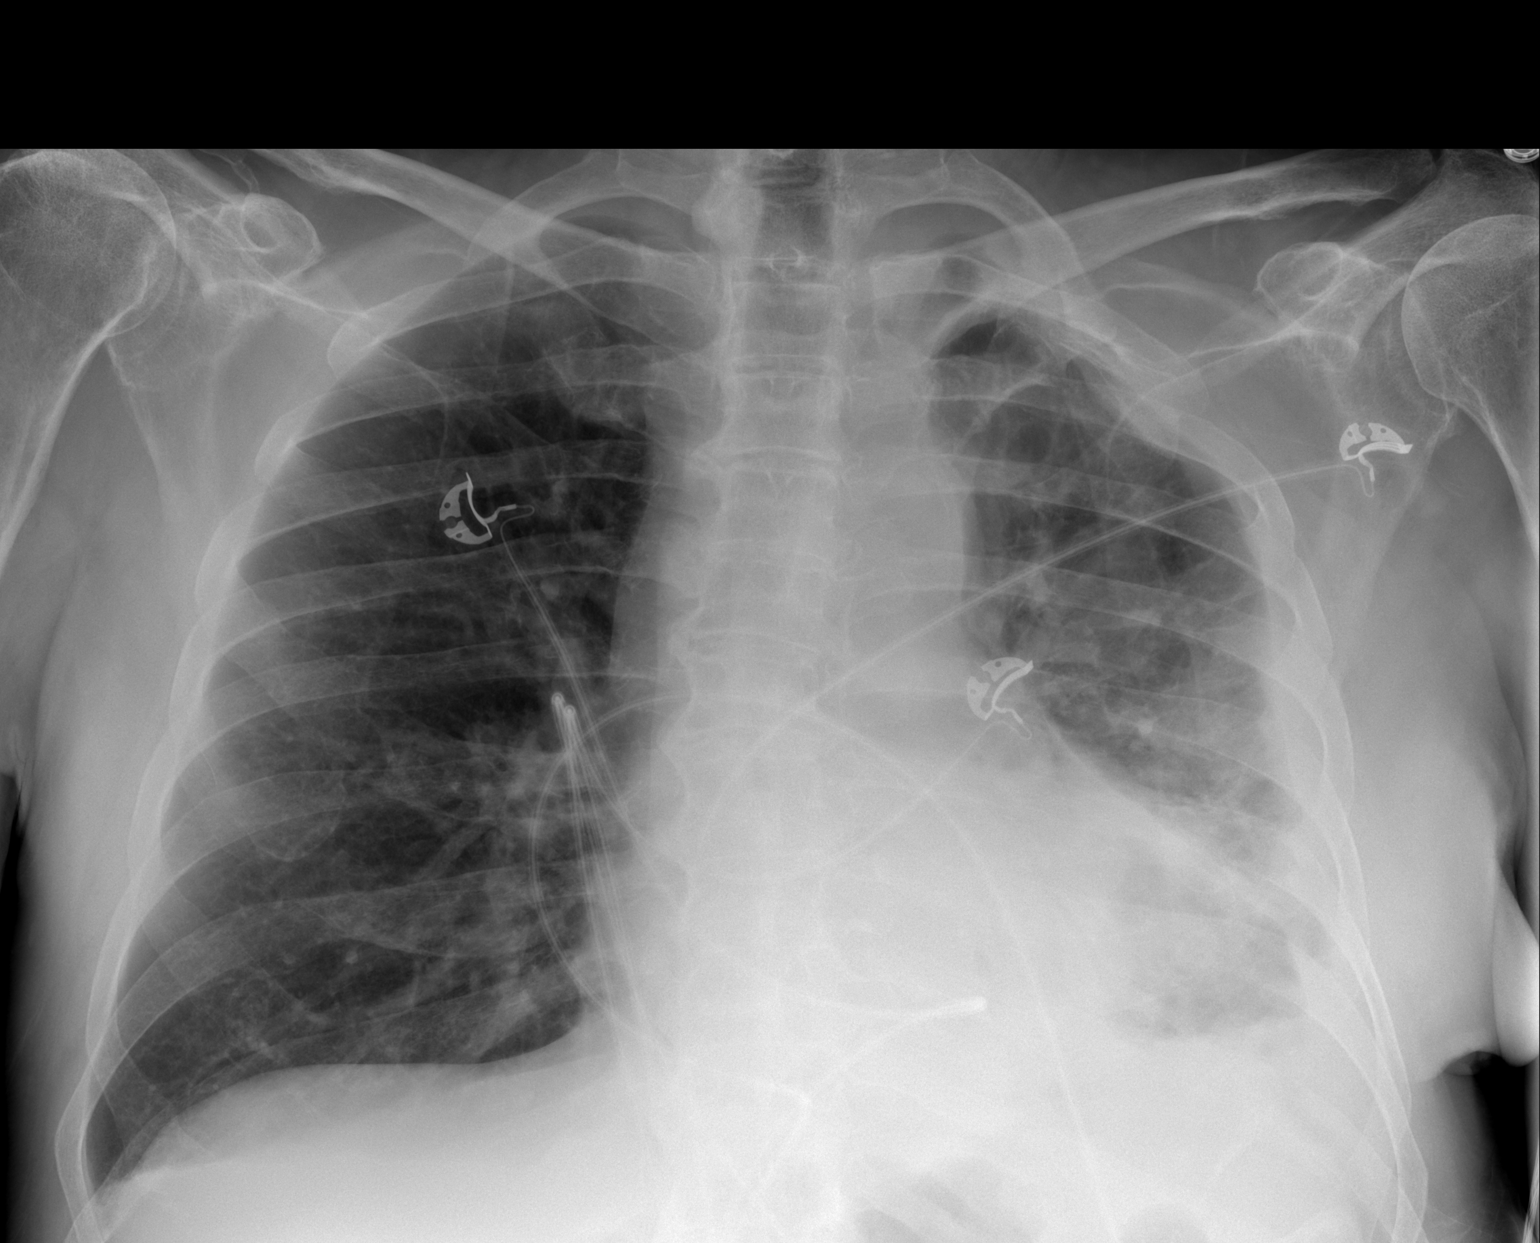

[w chest lat]
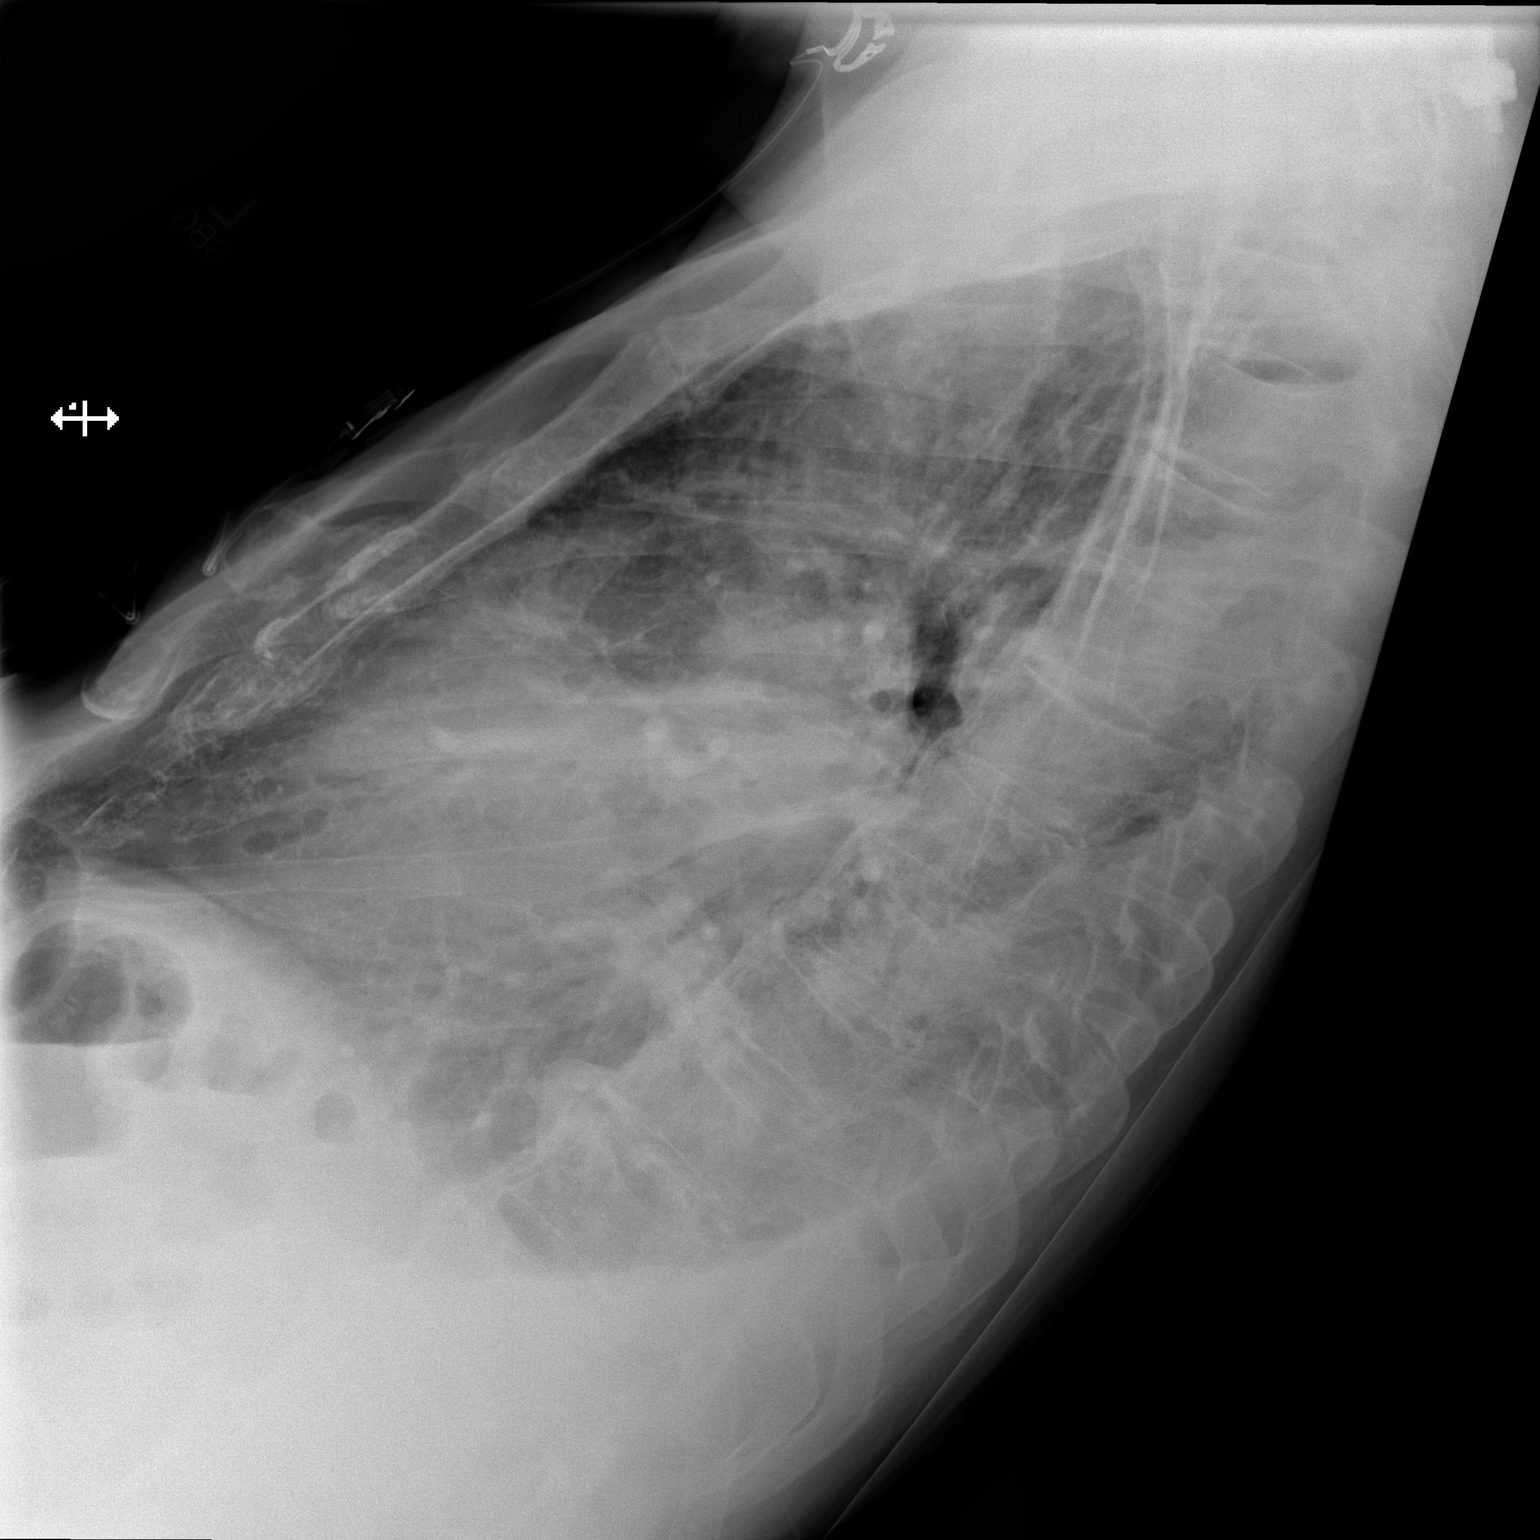

[2 of 2 positions shown; findings below may reference images not displayed]

FINDINGS: There has been further improvement in left lung aeration. A portion
of the left lung base is no aerated.

Right lung remains clear.

No pneumothorax.
IMPRESSION: 1. Mild improvement in left lung aeration since the previous day's
study. No other change. No pneumothorax.

## 2019-03-21 ENCOUNTER — Other Ambulatory Visit: Payer: Self-pay

## 2019-03-21 ENCOUNTER — Encounter (HOSPITAL_COMMUNITY): Payer: Self-pay | Admitting: Radiology

## 2019-03-21 ENCOUNTER — Inpatient Hospital Stay (HOSPITAL_COMMUNITY)
Admission: EM | Admit: 2019-03-21 | Discharge: 2019-03-24 | DRG: 280 | Disposition: A | Discharge status: Home Health | Payer: Medicare Other | Attending: Student | Admitting: Student

## 2019-03-21 ENCOUNTER — Emergency Department (HOSPITAL_COMMUNITY): Payer: Medicare Other

## 2019-03-21 DIAGNOSIS — Z981 Arthrodesis status: Secondary | ICD-10-CM | POA: Diagnosis not present

## 2019-03-21 DIAGNOSIS — I219 Acute myocardial infarction, unspecified: Secondary | ICD-10-CM | POA: Insufficient documentation

## 2019-03-21 DIAGNOSIS — I514 Myocarditis, unspecified: Secondary | ICD-10-CM | POA: Diagnosis present

## 2019-03-21 DIAGNOSIS — G8929 Other chronic pain: Secondary | ICD-10-CM | POA: Diagnosis present

## 2019-03-21 DIAGNOSIS — E440 Moderate protein-calorie malnutrition: Secondary | ICD-10-CM | POA: Insufficient documentation

## 2019-03-21 DIAGNOSIS — M549 Dorsalgia, unspecified: Secondary | ICD-10-CM | POA: Diagnosis not present

## 2019-03-21 DIAGNOSIS — I10 Essential (primary) hypertension: Secondary | ICD-10-CM | POA: Diagnosis present

## 2019-03-21 DIAGNOSIS — E785 Hyperlipidemia, unspecified: Secondary | ICD-10-CM | POA: Diagnosis present

## 2019-03-21 DIAGNOSIS — M545 Low back pain, unspecified: Secondary | ICD-10-CM | POA: Diagnosis present

## 2019-03-21 DIAGNOSIS — Z20828 Contact with and (suspected) exposure to other viral communicable diseases: Secondary | ICD-10-CM | POA: Diagnosis present

## 2019-03-21 DIAGNOSIS — Z800 Family history of malignant neoplasm of digestive organs: Secondary | ICD-10-CM | POA: Diagnosis not present

## 2019-03-21 DIAGNOSIS — T40605A Adverse effect of unspecified narcotics, initial encounter: Secondary | ICD-10-CM | POA: Diagnosis present

## 2019-03-21 DIAGNOSIS — K5903 Drug induced constipation: Secondary | ICD-10-CM | POA: Diagnosis present

## 2019-03-21 DIAGNOSIS — M5412 Radiculopathy, cervical region: Secondary | ICD-10-CM | POA: Diagnosis present

## 2019-03-21 DIAGNOSIS — G9340 Encephalopathy, unspecified: Secondary | ICD-10-CM | POA: Diagnosis not present

## 2019-03-21 DIAGNOSIS — I361 Nonrheumatic tricuspid (valve) insufficiency: Secondary | ICD-10-CM | POA: Diagnosis not present

## 2019-03-21 DIAGNOSIS — E860 Dehydration: Secondary | ICD-10-CM | POA: Diagnosis present

## 2019-03-21 DIAGNOSIS — Z87891 Personal history of nicotine dependence: Secondary | ICD-10-CM | POA: Diagnosis not present

## 2019-03-21 DIAGNOSIS — I214 Non-ST elevation (NSTEMI) myocardial infarction: Principal | ICD-10-CM | POA: Diagnosis present

## 2019-03-21 DIAGNOSIS — N400 Enlarged prostate without lower urinary tract symptoms: Secondary | ICD-10-CM | POA: Diagnosis present

## 2019-03-21 DIAGNOSIS — I447 Left bundle-branch block, unspecified: Secondary | ICD-10-CM | POA: Diagnosis present

## 2019-03-21 DIAGNOSIS — I728 Aneurysm of other specified arteries: Secondary | ICD-10-CM | POA: Diagnosis present

## 2019-03-21 DIAGNOSIS — G92 Toxic encephalopathy: Secondary | ICD-10-CM | POA: Diagnosis present

## 2019-03-21 DIAGNOSIS — I340 Nonrheumatic mitral (valve) insufficiency: Secondary | ICD-10-CM | POA: Diagnosis not present

## 2019-03-21 DIAGNOSIS — G4733 Obstructive sleep apnea (adult) (pediatric): Secondary | ICD-10-CM | POA: Diagnosis present

## 2019-03-21 DIAGNOSIS — M4712 Other spondylosis with myelopathy, cervical region: Secondary | ICD-10-CM | POA: Diagnosis present

## 2019-03-21 HISTORY — DX: Acute myocardial infarction, unspecified: I21.9

## 2019-03-21 HISTORY — DX: Non-ST elevation (NSTEMI) myocardial infarction: I21.4

## 2019-03-21 LAB — CBC WITH DIFFERENTIAL/PLATELET
Abs Immature Granulocytes: 0.02 10*3/uL (ref 0.00–0.07)
Basophils Absolute: 0 10*3/uL (ref 0.0–0.1)
Basophils Relative: 0 %
Eosinophils Absolute: 0 10*3/uL (ref 0.0–0.5)
Eosinophils Relative: 0 %
HCT: 43.6 % (ref 39.0–52.0)
Hemoglobin: 14 g/dL (ref 13.0–17.0)
Immature Granulocytes: 0 %
Lymphocytes Relative: 5 %
Lymphs Abs: 0.4 10*3/uL — ABNORMAL LOW (ref 0.7–4.0)
MCH: 30.5 pg (ref 26.0–34.0)
MCHC: 32.1 g/dL (ref 30.0–36.0)
MCV: 95 fL (ref 80.0–100.0)
Monocytes Absolute: 0.5 10*3/uL (ref 0.1–1.0)
Monocytes Relative: 7 %
Neutro Abs: 6.7 10*3/uL (ref 1.7–7.7)
Neutrophils Relative %: 88 %
Platelets: 144 10*3/uL — ABNORMAL LOW (ref 150–400)
RBC: 4.59 MIL/uL (ref 4.22–5.81)
RDW: 13.2 % (ref 11.5–15.5)
WBC: 7.7 10*3/uL (ref 4.0–10.5)
nRBC: 0 % (ref 0.0–0.2)

## 2019-03-21 LAB — HEPARIN LEVEL (UNFRACTIONATED): Heparin Unfractionated: 0.21 IU/mL — ABNORMAL LOW (ref 0.30–0.70)

## 2019-03-21 LAB — COMPREHENSIVE METABOLIC PANEL
ALT: 25 U/L (ref 0–44)
AST: 63 U/L — ABNORMAL HIGH (ref 15–41)
Albumin: 3.8 g/dL (ref 3.5–5.0)
Alkaline Phosphatase: 76 U/L (ref 38–126)
Anion gap: 12 (ref 5–15)
BUN: 17 mg/dL (ref 8–23)
CO2: 25 mmol/L (ref 22–32)
Calcium: 9.2 mg/dL (ref 8.9–10.3)
Chloride: 103 mmol/L (ref 98–111)
Creatinine, Ser: 0.96 mg/dL (ref 0.61–1.24)
GFR calc Af Amer: >60 mL/min (ref >60)
GFR calc non Af Amer: >60 mL/min (ref >60)
Glucose, Bld: 122 mg/dL — ABNORMAL HIGH (ref 70–99)
Potassium: 4 mmol/L (ref 3.5–5.1)
Sodium: 140 mmol/L (ref 135–145)
Total Bilirubin: 1.1 mg/dL (ref 0.3–1.2)
Total Protein: 6.1 g/dL — ABNORMAL LOW (ref 6.5–8.1)

## 2019-03-21 LAB — TROPONIN I (HIGH SENSITIVITY)
Troponin I (High Sensitivity): 2263 ng/L (ref <18)
Troponin I (High Sensitivity): 2126 ng/L (ref <18)
Troponin I (High Sensitivity): 1650 ng/L (ref <18)

## 2019-03-21 LAB — URINALYSIS, ROUTINE W REFLEX MICROSCOPIC
Bilirubin Urine: NEGATIVE
Glucose, UA: NEGATIVE mg/dL
Hgb urine dipstick: NEGATIVE
Ketones, ur: 80 mg/dL — AB
Leukocytes,Ua: NEGATIVE
Nitrite: NEGATIVE
Protein, ur: NEGATIVE mg/dL
Specific Gravity, Urine: 1.017 (ref 1.005–1.030)
pH: 6 (ref 5.0–8.0)

## 2019-03-21 LAB — I-STAT CHEM 8, ED
BUN: 19 mg/dL (ref 8–23)
Calcium, Ion: 1.14 mmol/L — ABNORMAL LOW (ref 1.15–1.40)
Chloride: 103 mmol/L (ref 98–111)
Creatinine, Ser: 0.8 mg/dL (ref 0.61–1.24)
Glucose, Bld: 119 mg/dL — ABNORMAL HIGH (ref 70–99)
HCT: 43 % (ref 39.0–52.0)
Hemoglobin: 14.6 g/dL (ref 13.0–17.0)
Potassium: 4 mmol/L (ref 3.5–5.1)
Sodium: 139 mmol/L (ref 135–145)
TCO2: 28 mmol/L (ref 22–32)

## 2019-03-21 LAB — CBG MONITORING, ED: Glucose-Capillary: 95 mg/dL (ref 70–99)

## 2019-03-21 LAB — SARS CORONAVIRUS 2 BY RT PCR (HOSPITAL ORDER, PERFORMED IN ~~LOC~~ HOSPITAL LAB): SARS Coronavirus 2: NEGATIVE

## 2019-03-21 MED ORDER — CARVEDILOL 6.25 MG PO TABS
6.2500 mg | ORAL_TABLET | Freq: Two times a day (BID) | ORAL | Status: DC
  Administered 2019-03-21 – 2019-03-24 (×6): 6.25 mg via ORAL
  Filled 2019-03-21 (×7): qty 1

## 2019-03-21 MED ORDER — HEPARIN (PORCINE) 25000 UT/250ML-% IV SOLN
1350.0000 [IU]/h | INTRAVENOUS | Status: DC
  Administered 2019-03-21: 1000 [IU]/h via INTRAVENOUS
  Administered 2019-03-22 (×2): 1250 [IU]/h via INTRAVENOUS
  Administered 2019-03-23: 1350 [IU]/h via INTRAVENOUS
  Filled 2019-03-21 (×3): qty 250

## 2019-03-21 MED ORDER — NITROGLYCERIN IN D5W 200-5 MCG/ML-% IV SOLN: 0.0000 ug/min | INTRAVENOUS | Status: DC

## 2019-03-21 MED ORDER — FENTANYL CITRATE (PF) 100 MCG/2ML IJ SOLN
50.0000 ug | Freq: Once | INTRAMUSCULAR | Status: AC
  Administered 2019-03-21: 50 ug via INTRAVENOUS
  Filled 2019-03-21: qty 2

## 2019-03-21 MED ORDER — HYDROMORPHONE HCL 2 MG PO TABS
2.0000 mg | ORAL_TABLET | Freq: Four times a day (QID) | ORAL | Status: DC | PRN
  Administered 2019-03-21 – 2019-03-22 (×3): 2 mg via ORAL
  Filled 2019-03-21 (×3): qty 1

## 2019-03-21 MED ORDER — IOHEXOL 350 MG/ML SOLN
100.0000 mL | Freq: Once | INTRAVENOUS | Status: AC | PRN
  Administered 2019-03-21: 12:00:00 100 mL via INTRAVENOUS

## 2019-03-21 MED ORDER — NITROGLYCERIN IN D5W 200-5 MCG/ML-% IV SOLN
0.0000 ug/min | INTRAVENOUS | Status: DC
  Administered 2019-03-21 – 2019-03-22 (×2): 5 ug/min via INTRAVENOUS
  Filled 2019-03-21: qty 250

## 2019-03-21 MED ORDER — MORPHINE SULFATE (PF) 4 MG/ML IV SOLN
4.0000 mg | INTRAVENOUS | Status: DC | PRN
  Administered 2019-03-21: 4 mg via INTRAVENOUS
  Filled 2019-03-21: qty 1

## 2019-03-21 MED ORDER — HEPARIN BOLUS VIA INFUSION
4000.0000 [IU] | Freq: Once | INTRAVENOUS | Status: AC
  Administered 2019-03-21: 4000 [IU] via INTRAVENOUS
  Filled 2019-03-21: qty 4000

## 2019-03-21 MED ORDER — ASPIRIN 81 MG PO CHEW
324.0000 mg | CHEWABLE_TABLET | Freq: Once | ORAL | Status: AC
  Administered 2019-03-21: 13:00:00 324 mg via ORAL
  Filled 2019-03-21: qty 4

## 2019-03-21 MED ORDER — SODIUM CHLORIDE 0.9 % IV SOLN
INTRAVENOUS | Status: DC
  Administered 2019-03-22 – 2019-03-23 (×2): via INTRAVENOUS

## 2019-03-21 MED ORDER — SODIUM CHLORIDE 0.9 % IV SOLN
INTRAVENOUS | Status: AC
  Administered 2019-03-22 (×2): via INTRAVENOUS

## 2019-03-21 MED ORDER — MORPHINE SULFATE (PF) 4 MG/ML IV SOLN
4.0000 mg | INTRAVENOUS | Status: DC | PRN
  Administered 2019-03-21 – 2019-03-23 (×5): 4 mg via INTRAVENOUS
  Filled 2019-03-21 (×5): qty 1

## 2019-03-21 MED ORDER — SODIUM CHLORIDE 0.9 % IV BOLUS
250.0000 mL | Freq: Once | INTRAVENOUS | Status: AC
  Administered 2019-03-21: 250 mL via INTRAVENOUS

## 2019-03-21 MED ORDER — ONDANSETRON HCL 4 MG/2ML IJ SOLN: 4.0000 mg | Freq: Four times a day (QID) | INTRAMUSCULAR | Status: DC | PRN

## 2019-03-21 MED ORDER — NITROGLYCERIN 0.4 MG SL SUBL: 0.4000 mg | SUBLINGUAL_TABLET | SUBLINGUAL | Status: DC | PRN

## 2019-03-21 MED ORDER — ACETAMINOPHEN 325 MG PO TABS
650.0000 mg | ORAL_TABLET | ORAL | Status: DC | PRN
  Administered 2019-03-21: 650 mg via ORAL
  Filled 2019-03-21: qty 2

## 2019-03-21 MED ORDER — ATORVASTATIN CALCIUM 40 MG PO TABS
40.0000 mg | ORAL_TABLET | Freq: Every day | ORAL | Status: DC
  Administered 2019-03-21 – 2019-03-23 (×3): 40 mg via ORAL
  Filled 2019-03-21 (×3): qty 1

## 2019-03-21 NOTE — Progress Notes (Signed)
ANTICOAGULATION CONSULT NOTE - Initial Consult  Pharmacy Consult for heparin Indication: chest pain/ACS  Allergies  Allergen Reactions  . Diazepam Other (See Comments)    "makes goofy"    Patient Measurements: Height: 5' 10.5" (179.1 cm) Weight: 171 lb (77.6 kg) IBW/kg (Calculated) : 74.15 Heparin Dosing Weight: 77.6kg  Vital Signs: Temp: 98.5 F (36.9 C) (06/27 1127) Temp Source: Rectal (06/27 1127) BP: 153/91 (06/27 1245) Pulse Rate: 97 (06/27 1245)  Labs: Recent Labs    03/21/19 1059 03/21/19 1112  HGB 14.0 14.6  HCT 43.6 43.0  PLT 144*  --   CREATININE 0.96 0.80  TROPONINIHS 2,263*  --     Estimated Creatinine Clearance: 72.1 mL/min (by C-G formula based on SCr of 0.8 mg/dL).   Medical History: Past Medical History:  Diagnosis Date  . Arthritis    Degeneration spine & stenosis  . History of kidney stones   . Hypertension    has a histroy of  . Hypotension   . Sleep apnea 2012   used CPAP 2 yrs. ago, feels he sleeps better w/o, no longer using     Medications:  Infusions:  . heparin      Assessment: 76 yom presented to the ED with CP. Troponin elevated and now starting IV heparin. Baseline Hgb is WNL and platelets are slightly low. He is not on anticoagulation PTA.   Goal of Therapy:  Heparin level 0.3-0.7 units/ml Monitor platelets by anticoagulation protocol: Yes   Plan:  Heparin bolus 4000 units IV x 1 Heparin gtt 1000 units/hr Check an 8 hr heparin level Daily heparin level and CBC  Rumbarger, Rande Lawman 03/21/2019,12:52 PM

## 2019-03-21 NOTE — ED Triage Notes (Signed)
Pt here c/o chest pain that began 2 days ago along with lower back pain ; pt states that he has chronic back  Pain and is usually able to walk around the house , but was not able to get out bed today ; pt report slight sob

## 2019-03-21 NOTE — Progress Notes (Signed)
ANTICOAGULATION CONSULT NOTE - [place] for heparin Indication: chest pain/ACS  Allergies  Allergen Reactions  . Diazepam Other (See Comments)    "makes goofy"    Patient Measurements: Height: 5' 10.5" (179.1 cm) Weight: 171 lb (77.6 kg) IBW/kg (Calculated) : 74.15 Heparin Dosing Weight: 77.6kg  Vital Signs: Temp: 98.1 F (36.7 C) (06/27 2036) Temp Source: Oral (06/27 2036) BP: 139/72 (06/27 2036) Pulse Rate: 75 (06/27 2036)  Labs: Recent Labs    03/21/19 1059 03/21/19 1112 03/21/19 1306 03/21/19 1832 03/21/19 2204  HGB 14.0 14.6  --   --   --   HCT 43.6 43.0  --   --   --   PLT 144*  --   --   --   --   HEPARINUNFRC  --   --   --   --  0.21*  CREATININE 0.96 0.80  --   --   --   TROPONINIHS 2,263*  --  2,126* 1,650*  --     Estimated Creatinine Clearance: 72.1 mL/min (by C-G formula based on SCr of 0.8 mg/dL).   Medical History: Past Medical History:  Diagnosis Date  . Arthritis    Degeneration spine & stenosis  . History of kidney stones   . Hypertension    has a histroy of  . Hypotension   . Sleep apnea 2012   used CPAP 2 yrs. ago, feels he sleeps better w/o, no longer using     Medications:  Infusions:  . sodium chloride Stopped (03/21/19 1909)  . sodium chloride    . heparin 1,000 Units/hr (03/21/19 1321)  . nitroGLYCERIN 5 mcg/min (03/21/19 1522)    Assessment: 85 yo M presented to the ED with CP. Troponin elevated and started on IV heparin. Baseline Hgb is WNL and platelets are slightly low. He is not on anticoagulation PTA.   Heparin level is subtherapeutic on 1000 units/hr.    Goal of Therapy:  Heparin level 0.3-0.7 units/ml Monitor platelets by anticoagulation protocol: Yes   Plan:  Increase Heparin gtt 1250 units/hr Daily heparin level and CBC  Kimberly Hammons, Pharm.D., BCPS Clinical Pharmacist  **Pharmacist phone directory can now be found on [email or web address] ([email or web address]).  Listed under [place].  03/21/2019 10:53 PM

## 2019-03-21 NOTE — Consult Note (Addendum)
Cardiology Consultation:   Patient ID: Aaron Schmidt; [id number]; 1933/08/25   Admit date: 03/21/2019 Date of Consult: 03/21/2019  Primary Care Provider: Hulan Fess, MD Primary Cardiologist: Ena Dawley, MD Primary Electrophysiologist:  None   Patient Profile:   Aaron Schmidt is a 85 y.o. male with a PMH of HTN, HLD, chronic LBBB, OSA on CPAP, and HOH who is being seen today for the evaluation of chest pain at the request of Dr. Regenia Skeeter.  History of Present Illness:   [name] was in his usual state of health until 03/20/2019 when he began experiencing generalized weakness. Wife reported he had littler to [place] yesterday. He woke up this morning and was too weak to get out of bed, prompting EMS activation. On arrival to the ED he reported chest pain. He was also noted to have AMS.   The patient is currently confused and poor historian, I called his wife who states that patient is not very active at baseline, but able to get around his house and also able to walk short distances like an grocery store with a walker.  He was in his usual state of health until 2 days ago, yesterday appeared more fatigue than usual, because he uses pain medications for chronic back pain and his wife assumed that he used higher dose, when she tried to help him to go to the bathroom he could not support himself.  He then appeared confused yesterday and she brought him to the ER today.  He has not been complaining of any chest pain or shortness of breath prior to arrival to the ED.  He was last evaluated by cardiology at an outpatient visit with Estella Husk, PA-C in 2018 for preoperative assessment. He underwent an echocardiogram at that time which revealed EF 60-65%, G1DD, and mild MR. No records of any prior ischemic evaluation, though he has been noted to have aortic atherosclerosis on prior CTA Chest in 2018.   Hospital course: Hypertensive, otherwise VSS. Labs notable for electrolytes wnl, Cr  0.96, AST mildly elevated, WBC 7.7, PLT 144, Trop 2,263 > 2126. UA negative. 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 CT Head negative for acute findings. CXR without PNA or pulmonary edema. EKG with sinus rhythm with chronic LBBB, rate 94.   Past Medical History:  Diagnosis Date  . Arthritis    Degeneration spine & stenosis  . History of kidney stones   . Hypertension    has a histroy of  . Hypotension   . Sleep apnea 2012   used CPAP 2 yrs. ago, feels he sleeps better w/o, no longer using     Past Surgical History:  Procedure Laterality Date  . ARM NEUROPLASTY     at 14 yrs. of age- fell off tractor- had fracture & repair *& later- 48's had  transplantation of a nerve at the elbow  . BACK SURGERY     x4 back surgery x2 fusion -  . CARPAL TUNNEL RELEASE Right   . COLONOSCOPY    . ESOPHAGOGASTRODUODENOSCOPY (EGD) WITH PROPOFOL N/A 09/23/2017   Procedure: ESOPHAGOGASTRODUODENOSCOPY (EGD) WITH PROPOFOL;  Surgeon: Laurence Spates, MD;  Location: [facility];  Service: Endoscopy;  Laterality: N/A;  . PLEURAL EFFUSION DRAINAGE Left 09/09/2017   Procedure: DRAINAGE OF PLEURAL EFFUSION;  Surgeon: [name], MD;  Location: [facility];  Service: Thoracic;  Laterality: Left;  . POSTERIOR CERVICAL FUSION/FORAMINOTOMY N/A 06/24/2014  Procedure: Posterior Cervical Three-Seven Fusion with Lateral Mass Fixation;  Surgeon: Erline Levine, MD;  Location: [facility];  Service: Neurosurgery;  Laterality: N/A;  C3-C7 posterior cervical fusion with lateral mass fixation  . SHOULDER SURGERY Bilateral   . VIDEO ASSISTED THORACOSCOPY (VATS)/DECORTICATION Left 09/09/2017   Procedure: VIDEO ASSISTED THORACOSCOPY (VATS)/DECORTICATION;  Surgeon: [name], MD;  Location: [place] [facility];  Service: Thoracic;  Laterality: Left;  Marland Kitchen VIDEO BRONCHOSCOPY  09/09/2017   Procedure: VIDEO BRONCHOSCOPY;   Surgeon: [name], MD;  Location: [facility];  Service: Thoracic;;     Home Medications:  Prior to Admission medications   Medication Sig Start Date End Date Taking? Authorizing Provider  Biotin 10000 MCG TABS Take 1 tablet by mouth daily.   Yes [provider]  Cholecalciferol (VITAMIN D) 2000 UNITS CAPS Take 2,000 Units by mouth daily.    Yes [provider]  Ferrous Sulfate (IRON) 325 (65 Fe) MG TABS Take 1 tablet by mouth every other day.    Yes [provider]  finasteride (PROSCAR) 5 MG tablet Take 5 mg by mouth daily.   Yes [provider]  Flaxseed, Linseed, (FLAX SEED OIL) 1000 MG CAPS Take 1,000 mg by mouth daily.    Yes [provider]  folic acid (FOLVITE) 161 MCG tablet Take 400 mcg by mouth daily.   Yes [provider]  HYDROmorphone (DILAUDID) 2 MG tablet Take 2 mg by mouth 4 (four) times daily as needed for severe pain.   Yes [provider]  L-Lysine 500 MG CAPS Take 500 mg by mouth daily.    Yes [provider]  mineral oil liquid Take 15 mLs by mouth at bedtime.    Yes [provider]  mineral oil liquid Take 15 mLs by mouth daily as needed for moderate constipation.   Yes [provider]  Multiple Vitamin (MULTIVITAMIN WITH MINERALS) TABS tablet Take 1 tablet by mouth daily. Mature mens 50+   Yes [provider]  NIACIN CR PO Take 25 mg by mouth daily.   Yes [provider]  nortriptyline (PAMELOR) 25 MG capsule Take 25 mg by mouth at bedtime.    Yes [provider]  OVER THE COUNTER MEDICATION Take 6 tablets by mouth daily. Perdiem 15 mg   Yes [provider]  PANTOTHENIC ACID PO Take 5.5 mg by mouth daily.   Yes [provider]  polyethylene glycol (MIRALAX / GLYCOLAX) packet Take 17 g by mouth daily. 10/13/17  Yes Sherwood Gambler, MD  pregabalin (LYRICA) 150 MG capsule Take 150 mg by mouth 2 (two) times daily.   Yes [provider]  pyridOXINE (VITAMIN B-6) 100 MG tablet Take 100 mg by mouth daily.     Yes [provider]  saw palmetto 80 MG capsule Take 240 mg by mouth daily.    Yes [provider]  Senna-Psyllium (PERDIEM PO) Take 7 tablets by mouth daily.    Yes [provider]  simvastatin (ZOCOR) 40 MG tablet Take 40 mg by mouth at bedtime.    Yes [provider]  SODIUM CHLORIDE PO Take by mouth daily. 10 mg   Yes [provider]  tiZANidine (ZANAFLEX) 2 MG tablet Take 2 mg by mouth 3 (three) times daily.    Yes [provider]  vitamin B-12 (CYANOCOBALAMIN) 100 MCG tablet Take 100 mcg by mouth daily.   Yes [provider]  vitamin C (ASCORBIC ACID) 500 MG tablet Take  500 mg by mouth daily.   Yes [provider]    Inpatient Medications: Scheduled Meds:  Continuous Infusions: . heparin 1,000 Units/hr (03/21/19 1321)   PRN Meds: nitroGLYCERIN  Allergies:    Allergies  Allergen Reactions  . Diazepam Other (See Comments)    "makes goofy"    Social History:   Social History   Socioeconomic History  . Marital status: Married    Spouse name: Not on file  . Number of children: Not on file  . Years of education: Not on file  . Highest education level: Not on file  Occupational History  . Occupation: Retired    Comment: Worked for [employer] and retired in [place]  . Financial resource strain: Not on file  . Food insecurity    Worry: Not on file    Inability: Not on file  . Transportation needs    Medical: Not on file    Non-medical: Not on file  Tobacco Use  . Smoking status: Former Smoker    Years: 24.00    Types: Pipe    Quit date: 09/24/1978    Years since quitting: 40.5  . Smokeless tobacco: Never Used  Substance and Sexual Activity  . Alcohol use: Yes    Comment: 3 drinks/day - gin or vodka   . Drug use: No  . Sexual activity: Not on file  Lifestyle  . Physical activity    Days per week: Not  on file    Minutes per session: Not on file  . Stress: Not on file  Relationships  . Social Herbalist on phone: Not on file    Gets together: Not on file    Attends religious service: Not on file    Active member of club or organization: Not on file    Attends meetings of clubs or organizations: Not on file    Relationship status: Not on file  . Intimate partner violence    Fear of current or ex partner: Not on file    Emotionally abused: Not on file    Physically abused: Not on file    Forced sexual activity: Not on file  Other Topics Concern  . Not on file  Social History Narrative  . Not on file    Family History:    Family History  Problem Relation Age of Onset  . Pancreatic cancer Mother      ROS:  Please see the history of present illness.   All other ROS reviewed and negative.     Physical Exam/Data:   Vitals:   03/21/19 1300 03/21/19 1315 03/21/19 1330 03/21/19 1345  BP: (!) 165/100 (!) 166/83 (!) 165/85 (!) 168/80  Pulse: 97 95 99 93  Resp: 16 14 17 13   Temp:      TempSrc:      SpO2: 100% 99% 99% 100%  Weight:      Height:       No intake or output data in the 24 hours ending 03/21/19 1355 Filed Weights   03/21/19 1200  Weight: 77.6 kg   Body mass index is 24.19 kg/m.  General:  Well nourished, well developed, in no acute distress, hard of hearing and only oriented to himself HEENT: sclera anicteric  Lymph: no adenopathy Neck: no JVD Endocrine:  No thryomegaly Vascular: No carotid bruits; distal pulses 2+ bilaterally Cardiac:  normal S1, S2; RRR; 2 out of 6 murmur  Lungs:  clear to auscultation bilaterally, no wheezing,  rhonchi or rales  Abd: NABS, soft, nontender, no hepatomegaly Ext: no edema Musculoskeletal:  No deformities, BUE and BLE strength normal and equal Skin: warm and dry  Neuro:  CNs 2-12 intact, no focal abnormalities noted Psych:  Normal affect   EKG:  The EKG was personally reviewed and demonstrates:  Sinus rhythm  with chronic LBBB, no significant change from previous Telemetry:  Telemetry was personally reviewed and demonstrates: Sinus rhythm with left bundle branch block  Relevant CV Studies: Echocardiogram 2018: Study Conclusions  - Left ventricle: The cavity size was normal. Wall thickness was   increased in a pattern of mild LVH. Systolic function was normal.   The estimated ejection fraction was in the range of 60% to 65%.   Wall motion was normal; there were no regional wall motion   abnormalities. Doppler parameters are consistent with abnormal   left ventricular relaxation (grade 1 diastolic dysfunction). - Mitral valve: There was mild regurgitation.   Laboratory Data:  Chemistry Recent Labs  Lab 03/21/19 1059 03/21/19 1112  NA 140 139  K 4.0 4.0  CL 103 103  CO2 25  --   GLUCOSE 122* 119*  BUN 17 19  CREATININE 0.96 0.80  CALCIUM 9.2  --   GFRNONAA >60  --   GFRAA >60  --   ANIONGAP 12  --     Recent Labs  Lab 03/21/19 1059  PROT 6.1*  ALBUMIN 3.8  AST 63*  ALT 25  ALKPHOS 76  BILITOT 1.1   Hematology Recent Labs  Lab 03/21/19 1059 03/21/19 1112  WBC 7.7  --   RBC 4.59  --   HGB 14.0 14.6  HCT 43.6 43.0  MCV 95.0  --   MCH 30.5  --   MCHC 32.1  --   RDW 13.2  --   PLT 144*  --    Cardiac EnzymesNo results for input(s): TROPONINI in the last 168 hours. No results for input(s): TROPIPOC in the last 168 hours.  BNPNo results for input(s): BNP, PROBNP in the last 168 hours.  DDimer No results for input(s): DDIMER in the last 168 hours.  Radiology/Studies:  Ct Head Wo Contrast  Result Date: 03/21/2019 CLINICAL DATA:  Altered mental status. Altered level of consciousness, unexplained. EXAM: CT HEAD WITHOUT CONTRAST TECHNIQUE: Contiguous axial images were obtained from the base of the skull through the vertex without intravenous contrast. COMPARISON:  CT scan dated 05/22/2018 FINDINGS: Brain: There is no acute infarction, hemorrhage, or mass lesion. There  is diffuse cerebral cortical atrophy. Several tiny areas of periventricular white matter lucency consistent with chronic small vessel ischemic disease, stable. Vascular: No hyperdense vessel or unexpected calcification. Skull: Normal. Negative for fracture or focal lesion. Sinuses/Orbits: No significant abnormality. Small retention cyst in the base of the left maxillary sinus. Other: None. IMPRESSION: No acute intracranial abnormality. Diffuse mild atrophy, stable. Minimal small vessel ischemic changes, stable. Electronically Signed   By: Lorriane Shire M.D.   On: 03/21/2019 12:40   Dg Chest Portable 1 View  Result Date: 03/21/2019 CLINICAL DATA:  Chest pain starting 2 days ago, some shortness of breath. EXAM: PORTABLE CHEST 1 VIEW COMPARISON:  Chest x-rays dated 10/08/2017 and 10/02/2017. FINDINGS: Heart size and mediastinal contours are stable. Chronic scarring/atelectasis at the LEFT lung base. No new lung findings. No pneumothorax seen. Osseous structures about the chest are unremarkable. IMPRESSION: No active disease. No evidence of pneumonia or pulmonary edema. Electronically Signed   By: Roxy Horseman.D.  On: 03/21/2019 11:28   Ct Angio Chest/abd/pel For Dissection W And/or Wo Contrast  Result Date: 03/21/2019 CLINICAL DATA:  Chest pain, lower back pain, aortic dissection suspected EXAM: CT ANGIOGRAPHY CHEST, ABDOMEN AND PELVIS TECHNIQUE: Multidetector CT imaging through the chest, abdomen and pelvis was performed using the standard protocol during bolus administration of intravenous contrast. Multiplanar reconstructed images and MIPs were obtained and reviewed to evaluate the vascular anatomy. CONTRAST:  141mL OMNIPAQUE IOHEXOL 350 MG/ML SOLN COMPARISON:  CT chest angiogram, 08/13/2017 FINDINGS: CTA CHEST FINDINGS Cardiovascular: Preferential opacification of the thoracic aorta. No evidence of thoracic aortic aneurysm or dissection. Mild aortic atherosclerosis. Cardiomegaly. No pericardial  effusion. Mediastinum/Nodes: No enlarged mediastinal, hilar, or axillary lymph nodes. Thyroid gland, trachea, and esophagus demonstrate no significant findings. Lungs/Pleura: Small left, trace right pleural effusions with associated atelectasis or consolidation. No pleural effusion or pneumothorax. Musculoskeletal: No chest wall abnormality. No acute or significant osseous findings. Review of the MIP images confirms the above findings. CTA ABDOMEN AND PELVIS FINDINGS VASCULAR Normal contour and caliber of the abdominal aorta. No evidence of aneurysm or dissection. Mild atherosclerosis. 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 Standard branching pattern of the aorta. Review of the MIP images confirms the above findings. NON-VASCULAR Hepatobiliary: No solid liver abnormality is seen. The gallbladder is distended. No gallbladder wall thickening, or biliary dilatation. Pancreas: Unremarkable. No pancreatic ductal dilatation or surrounding inflammatory changes. Spleen: Normal in size without significant abnormality. Adrenals/Urinary Tract: Small, benign fat containing nodule of the right adrenal gland. Kidneys are normal, without renal calculi, solid lesion, or hydronephrosis. Bladder is unremarkable. Stomach/Bowel: Stomach is within normal limits. Appendix appears normal. No evidence of bowel wall thickening, distention, or inflammatory changes. Pancolonic diverticulosis. There is a very redundant sigmoid colon, with a moderate burden of stool throughout. Vascular/Lymphatic: No significant vascular findings are present. No enlarged abdominal or pelvic lymph nodes. Reproductive: No mass or other significant abnormality. Other: No abdominal wall hernia or abnormality. No abdominopelvic ascites. Musculoskeletal: No acute or significant osseous findings. Posterior fusion and discectomy of L3-L4. Ankylosis of the spine. Review of the MIP images confirms the  above findings. IMPRESSION: 1. Normal contour and caliber of the aorta without evidence of aneurysm, dissection, or other acute aortic syndrome. Mild atherosclerosis. 2. 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 This appears slightly enlarged in comparison to the vessel as incidentally included on prior CT chest angiogram dated 08/13/2017, at which time it measured 1.1 cm when measured similarly. 3. The gallbladder is distended. No obstructing calculus or biliary ductal dilatation is noted. Consider dedicated gallbladder ultrasound to further evaluate if gallbladder pathology is suspected. 4. Other chronic, incidental, and postoperative findings of the chest, abdomen, and pelvis as detailed above. Electronically Signed   By: Eddie Candle M.D.   On: 03/21/2019 12:47    Assessment and Plan:   1. Chest pain: patient presented with chest pain of unclear duration. He is confused and unable to provide a compelling history. Trop was elevated to 2263>2126. EKG with chronic LBBB. Suspect this is ACS.  - Continue heparin gtt - Will start nitro gtt given ongoing chest pain - start carvedilol 6.25 mg po BID -Start IV hydration as he has not eaten in the last 2 days and per wife prior episodes of confusion with dehydration -Per wife they would like to proceed with full work-up and therapy as he has been fully  functional until 2 days ago, if necessary day would agree with cardiac catheterization.  We will tentatively plan for Monday. - Will check lipid panel in AM  2. HTN: BP elevated. Not on HTN medications.  - Starting nitro gtt for management of #1 - Further management pending echo and ischemic evaluation  3. HLD: no lipid panel on file and patient is not on any statin therapies.  - Will check lipid panel in AM - AST mildly elevated - will start atorvastatin 40mg  daily for now  For questions or updates, please contact [place] Please consult [email or web address] for contact  info under Cardiology/STEMI.   Signed, Ena Dawley, MD 03/21/2019 1:55 PM [phone]

## 2019-03-21 NOTE — ED Provider Notes (Addendum)
[facility] EMERGENCY DEPARTMENT Provider Note   CSN: [id number] Arrival date & time: 03/21/19  1037   LEVEL 5 CAVEAT - ALTERED MENTAL STATUS   History   Chief Complaint Chief Complaint  Patient presents with  . Chest Pain  . Back Pain    HPI Aaron Schmidt is a 85 y.o. male.     HPI  85 year old male presents with a chief complaint of headache, chest pain, back pain.  History is somewhat limited as he is very hard of hearing and appears to be somewhat confused.  Wife states that he has been sleeping more and feeling overall weaker for the last 24 hours or so.  Could not get out of bed due to the weakness.  Again happened this morning and so 911 was called.  He tells me he has been having some chest pain since earlier, though he states it was earlier in the afternoon and thinks now is midnight.  He also states he has had a headache as well.  He has chronic low back pain but ever since being in the ambulance it is much worse than typical.  At this point the pain is severe.  Wife reports no fevers or cough. Wife wonders if he's dehydrated because he didn't eat/drink much yesterday.  Past Medical History:  Diagnosis Date  . Arthritis    Degeneration spine & stenosis  . History of kidney stones   . Hypertension    has a histroy of  . Hypotension   . Sleep apnea 2012   used CPAP 2 yrs. ago, feels he sleeps better w/o, no longer using     Patient Active Problem List   Diagnosis Date Noted  . Anemia 09/21/2017  . Symptomatic anemia 09/20/2017  . Protein-calorie malnutrition, severe 09/11/2017  . Empyema lung ([place]) 09/09/2017  . SIRS (systemic inflammatory response syndrome) ([place]) 09/05/2017  . Elevated troponin 09/05/2017  . Normochromic normocytic anemia 09/05/2017  . Hyponatremia 09/05/2017  . Acute kidney injury superimposed on chronic kidney disease ([place]) 09/05/2017  . Chronic pain 09/05/2017  . Pleural effusion, left 09/04/2017  . Pleural effusion on  left 08/19/2017  . Sinusitis, chronic 08/19/2017  . Murmur 01/08/2017  . Skin ulcer of toe of left foot, limited to breakdown of skin ([place]) 09/04/2016  . Cervical myelopathy with cervical radiculopathy 06/24/2014  . LBBB (left bundle branch block) 06/21/2014  . Hyperlipidemia 06/21/2014  . Preoperative cardiovascular examination 06/21/2014  . OBSTRUCTIVE SLEEP APNEA 09/30/2010  . Essential hypertension 08/29/2010    Past Surgical History:  Procedure Laterality Date  . ARM NEUROPLASTY     at 14 yrs. of age- fell off tractor- had fracture & repair *& later- 59's had  transplantation of a nerve at the elbow  . BACK SURGERY     x4 back surgery x2 fusion -  . CARPAL TUNNEL RELEASE Right   . COLONOSCOPY    . ESOPHAGOGASTRODUODENOSCOPY (EGD) WITH PROPOFOL N/A 09/23/2017   Procedure: ESOPHAGOGASTRODUODENOSCOPY (EGD) WITH PROPOFOL;  Surgeon: Laurence Spates, MD;  Location: [facility];  Service: Endoscopy;  Laterality: N/A;  . PLEURAL EFFUSION DRAINAGE Left 09/09/2017   Procedure: DRAINAGE OF PLEURAL EFFUSION;  Surgeon: [name], MD;  Location: [facility];  Service: Thoracic;  Laterality: Left;  . POSTERIOR CERVICAL FUSION/FORAMINOTOMY N/A 06/24/2014   Procedure: Posterior Cervical Three-Seven Fusion with Lateral Mass Fixation;  Surgeon: Erline Levine, MD;  Location: [facility];  Service: Neurosurgery;  Laterality: N/A;  C3-C7 posterior cervical fusion with  lateral mass fixation  . SHOULDER SURGERY Bilateral   . VIDEO ASSISTED THORACOSCOPY (VATS)/DECORTICATION Left 09/09/2017   Procedure: VIDEO ASSISTED THORACOSCOPY (VATS)/DECORTICATION;  Surgeon: [name], MD;  Location: [facility];  Service: Thoracic;  Laterality: Left;  Marland Kitchen VIDEO BRONCHOSCOPY  09/09/2017   Procedure: VIDEO BRONCHOSCOPY;  Surgeon: [name], MD;  Location: [facility];  Service: Thoracic;;        Home Medications    Prior to Admission medications   Medication Sig Start Date End Date Taking?  Authorizing Provider  Biotin 10000 MCG TABS Take 1 tablet by mouth daily.   Yes [provider]  Cholecalciferol (VITAMIN D) 2000 UNITS CAPS Take 2,000 Units by mouth daily.    Yes [provider]  Ferrous Sulfate (IRON) 325 (65 Fe) MG TABS Take 1 tablet by mouth every other day.    Yes [provider]  finasteride (PROSCAR) 5 MG tablet Take 5 mg by mouth daily.   Yes [provider]  Flaxseed, Linseed, (FLAX SEED OIL) 1000 MG CAPS Take 1,000 mg by mouth daily.    Yes [provider]  folic acid (FOLVITE) 263 MCG tablet Take 400 mcg by mouth daily.   Yes [provider]  HYDROmorphone (DILAUDID) 2 MG tablet Take 2 mg by mouth 4 (four) times daily as needed for severe pain.   Yes [provider]  L-Lysine 500 MG CAPS Take 500 mg by mouth daily.    Yes [provider]  mineral oil liquid Take 15 mLs by mouth at bedtime.    Yes [provider]  mineral oil liquid Take 15 mLs by mouth daily as needed for moderate constipation.   Yes [provider]  Multiple Vitamin (MULTIVITAMIN WITH MINERALS) TABS tablet Take 1 tablet by mouth daily. Mature mens 50+   Yes [provider]  NIACIN CR PO Take 25 mg by mouth daily.   Yes [provider]  nortriptyline (PAMELOR) 25 MG capsule Take 25 mg by mouth at bedtime.    Yes [provider]  OVER THE COUNTER MEDICATION Take 6 tablets by mouth daily. Perdiem 15 mg   Yes [provider]  PANTOTHENIC ACID PO Take 5.5 mg by mouth daily.   Yes [provider]  polyethylene glycol (MIRALAX / GLYCOLAX) packet Take 17 g by mouth daily. 10/13/17  Yes Sherwood Gambler, MD  pregabalin (LYRICA) 150 MG capsule Take 150 mg by mouth 2 (two) times daily.   Yes [provider]  pyridOXINE (VITAMIN B-6) 100 MG tablet Take 100 mg by mouth daily.     Yes [provider]  saw palmetto 80 MG capsule Take 240 mg by mouth daily.    Yes  [provider]  Senna-Psyllium (PERDIEM PO) Take 7 tablets by mouth daily.    Yes [provider]  simvastatin (ZOCOR) 40 MG tablet Take 40 mg by mouth at bedtime.    Yes [provider]  SODIUM CHLORIDE PO Take by mouth daily. 10 mg   Yes [provider]  tiZANidine (ZANAFLEX) 2 MG tablet Take 2 mg by mouth 3 (three) times daily.    Yes [provider]  vitamin B-12 (CYANOCOBALAMIN) 100 MCG tablet Take 100 mcg by mouth daily.   Yes [provider]  vitamin C (ASCORBIC ACID) 500 MG tablet Take 500 mg by mouth daily.   Yes [provider]    Family History Family History  Problem Relation Age of Onset  .  Pancreatic cancer Mother     Social History Social History   Tobacco Use  . Smoking status: Former Smoker    Years: 24.00    Types: Pipe    Quit date: 09/24/1978    Years since quitting: 40.5  . Smokeless tobacco: Never Used  Substance Use Topics  . Alcohol use: Yes    Comment: 3 drinks/day - gin or vodka   . Drug use: No     Allergies   Diazepam   Review of Systems Review of Systems  Unable to perform ROS: Mental status change     Physical Exam Updated Vital Signs BP (!) 171/78   Pulse 86   Temp 98.5 F (36.9 C) (Rectal)   Resp 11   SpO2 99%   Physical Exam Vitals signs and nursing note reviewed.  Constitutional:      Appearance: He is well-developed. He is diaphoretic.  HENT:     Head: Normocephalic and atraumatic.     Right Ear: External ear normal.     Left Ear: External ear normal.     Nose: Nose normal.  Eyes:     General:        Right eye: No discharge.        Left eye: No discharge.  Neck:     Musculoskeletal: Neck supple.  Cardiovascular:     Rate and Rhythm: Normal rate and regular rhythm.     Heart sounds: Normal heart sounds.  Pulmonary:     Effort: Pulmonary effort is normal.     Breath sounds: Normal breath sounds.  Abdominal:     Palpations: Abdomen is soft.      Tenderness: There is no abdominal tenderness.  Musculoskeletal:     Lumbar back: He exhibits tenderness.     Right lower leg: Edema (Foot/ankle) present.     Left lower leg: Edema (foot/ankle) present.  Skin:    General: Skin is warm.  Neurological:     Mental Status: He is alert. He is disoriented.     Comments: Awake, alert, oriented to person and place, disoriented to month. Thinks it's midnight. CN 3-12 grossly intact. 5/5 strength in all 4 extremities. Grossly normal sensation.  Psychiatric:        Mood and Affect: Mood is not anxious.      ED Treatments / Results  Labs (all labs ordered are listed, but only abnormal results are displayed) Labs Reviewed  COMPREHENSIVE METABOLIC PANEL - Abnormal; Notable for the following components:      Result Value   Glucose, Bld 122 (*)    Total Protein 6.1 (*)    AST 63 (*)    All other components within normal limits  CBC WITH DIFFERENTIAL/PLATELET - Abnormal; Notable for the following components:   Platelets 144 (*)    Lymphs Abs 0.4 (*)    All other components within normal limits  TROPONIN I (HIGH SENSITIVITY) - Abnormal; Notable for the following components:   Troponin I (High Sensitivity) 2,263 (*)    All other components within normal limits  TROPONIN I (HIGH SENSITIVITY) - Abnormal; Notable for the following components:   Troponin I (High Sensitivity) 2,126 (*)    All other components within normal limits  URINALYSIS, ROUTINE W REFLEX MICROSCOPIC - Abnormal; Notable for the following components:   Ketones, ur 80 (*)    All other components within normal limits  I-STAT CHEM 8, ED - Abnormal; Notable for the following components:   Glucose, Bld 119 (*)  Calcium, Ion 1.14 (*)    All other components within normal limits  SARS CORONAVIRUS 2 (HOSPITAL ORDER, PERFORMED IN [facility])  HEPARIN LEVEL (UNFRACTIONATED)  CBG MONITORING, ED    EKG EKG Interpretation  Date/Time:  Saturday March 21 2019 10:46:34  EDT Ventricular Rate:  90 PR Interval:    QRS Duration: 171 QT Interval:  443 QTC Calculation: 543 R Axis:   -41 Text Interpretation:  Sinus rhythm Left bundle branch block Confirmed by Sherwood Gambler [phone]) on 03/21/2019 10:51:07 AM   EKG Interpretation  Date/Time:  Saturday March 21 2019 12:23:13 EDT Ventricular Rate:  94 PR Interval:    QRS Duration: 166 QT Interval:  431 QTC Calculation: 539 R Axis:   -22 Text Interpretation:  Sinus rhythm Left bundle branch block no significant change since earlier in the day Confirmed by Sherwood Gambler [phone]) on 03/21/2019 12:49:03 PM       Radiology Ct Head Wo Contrast  Result Date: 03/21/2019 CLINICAL DATA:  Altered mental status. Altered level of consciousness, unexplained. EXAM: CT HEAD WITHOUT CONTRAST TECHNIQUE: Contiguous axial images were obtained from the base of the skull through the vertex without intravenous contrast. COMPARISON:  CT scan dated 05/22/2018 FINDINGS: Brain: There is no acute infarction, hemorrhage, or mass lesion. There is diffuse cerebral cortical atrophy. Several tiny areas of periventricular white matter lucency consistent with chronic small vessel ischemic disease, stable. Vascular: No hyperdense vessel or unexpected calcification. Skull: Normal. Negative for fracture or focal lesion. Sinuses/Orbits: No significant abnormality. Small retention cyst in the base of the left maxillary sinus. Other: None. IMPRESSION: No acute intracranial abnormality. Diffuse mild atrophy, stable. Minimal small vessel ischemic changes, stable. Electronically Signed   By: Lorriane Shire M.D.   On: 03/21/2019 12:40   Dg Chest Portable 1 View  Result Date: 03/21/2019 CLINICAL DATA:  Chest pain starting 2 days ago, some shortness of breath. EXAM: PORTABLE CHEST 1 VIEW COMPARISON:  Chest x-rays dated 10/08/2017 and 10/02/2017. FINDINGS: Heart size and mediastinal contours are stable. Chronic scarring/atelectasis at the LEFT lung base. No new  lung findings. No pneumothorax seen. Osseous structures about the chest are unremarkable. IMPRESSION: No active disease. No evidence of pneumonia or pulmonary edema. Electronically Signed   By: Franki Cabot M.D.   On: 03/21/2019 11:28   Ct Angio Chest/abd/pel For Dissection W And/or Wo Contrast  Result Date: 03/21/2019 CLINICAL DATA:  Chest pain, lower back pain, aortic dissection suspected EXAM: CT ANGIOGRAPHY CHEST, ABDOMEN AND PELVIS TECHNIQUE: Multidetector CT imaging through the chest, abdomen and pelvis was performed using the standard protocol during bolus administration of intravenous contrast. Multiplanar reconstructed images and MIPs were obtained and reviewed to evaluate the vascular anatomy. CONTRAST:  150mL OMNIPAQUE IOHEXOL 350 MG/ML SOLN COMPARISON:  CT chest angiogram, 08/13/2017 FINDINGS: CTA CHEST FINDINGS Cardiovascular: Preferential opacification of the thoracic aorta. No evidence of thoracic aortic aneurysm or dissection. Mild aortic atherosclerosis. Cardiomegaly. No pericardial effusion. Mediastinum/Nodes: No enlarged mediastinal, hilar, or axillary lymph nodes. Thyroid gland, trachea, and esophagus demonstrate no significant findings. Lungs/Pleura: Small left, trace right pleural effusions with associated atelectasis or consolidation. No pleural effusion or pneumothorax. Musculoskeletal: No chest wall abnormality. No acute or significant osseous findings. Review of the MIP images confirms the above findings. CTA ABDOMEN AND PELVIS FINDINGS VASCULAR Normal contour and caliber of the abdominal aorta. No evidence of aneurysm or dissection. Mild atherosclerosis. 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  Standard branching pattern of the aorta. Review of the MIP images confirms the above findings. NON-VASCULAR Hepatobiliary: No solid liver abnormality is seen. The gallbladder is distended. No gallbladder wall thickening, or biliary  dilatation. Pancreas: Unremarkable. No pancreatic ductal dilatation or surrounding inflammatory changes. Spleen: Normal in size without significant abnormality. Adrenals/Urinary Tract: Small, benign fat containing nodule of the right adrenal gland. Kidneys are normal, without renal calculi, solid lesion, or hydronephrosis. Bladder is unremarkable. Stomach/Bowel: Stomach is within normal limits. Appendix appears normal. No evidence of bowel wall thickening, distention, or inflammatory changes. Pancolonic diverticulosis. There is a very redundant sigmoid colon, with a moderate burden of stool throughout. Vascular/Lymphatic: No significant vascular findings are present. No enlarged abdominal or pelvic lymph nodes. Reproductive: No mass or other significant abnormality. Other: No abdominal wall hernia or abnormality. No abdominopelvic ascites. Musculoskeletal: No acute or significant osseous findings. Posterior fusion and discectomy of L3-L4. Ankylosis of the spine. Review of the MIP images confirms the above findings. IMPRESSION: 1. Normal contour and caliber of the aorta without evidence of aneurysm, dissection, or other acute aortic syndrome. Mild atherosclerosis. 2. 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 This appears slightly enlarged in comparison to the vessel as incidentally included on prior CT chest angiogram dated 08/13/2017, at which time it measured 1.1 cm when measured similarly. 3. The gallbladder is distended. No obstructing calculus or biliary ductal dilatation is noted. Consider dedicated gallbladder ultrasound to further evaluate if gallbladder pathology is suspected. 4. Other chronic, incidental, and postoperative findings of the chest, abdomen, and pelvis as detailed above. Electronically Signed   By: Eddie Candle M.D.   On: 03/21/2019 12:47    Procedures .Critical Care Performed by: Sherwood Gambler, MD Authorized by: Sherwood Gambler, MD   Critical care  provider statement:    Critical care time (minutes):  45   Critical care time was exclusive of:  Separately billable procedures and treating other patients   Critical care was necessary to treat or prevent imminent or life-threatening deterioration of the following conditions:  Cardiac failure   Critical care was time spent personally by me on the following activities:  Discussions with consultants, evaluation of patient's response to treatment, examination of patient, ordering and performing treatments and interventions, ordering and review of laboratory studies, ordering and review of radiographic studies, pulse oximetry, re-evaluation of patient's condition, obtaining history from patient or surrogate and review of old charts   (including critical care time)  Medications Ordered in ED Medications  fentaNYL (SUBLIMAZE) injection 50 mcg (has no administration in time range)  fentaNYL (SUBLIMAZE) injection 50 mcg (50 mcg Intravenous Given 03/21/19 1116)  iohexol (OMNIPAQUE) 350 MG/ML injection 100 mL (100 mLs Intravenous Contrast Given 03/21/19 1155)     Initial Impression / Assessment and Plan / ED Course  I have reviewed the triage vital signs and the nursing notes.  Pertinent labs & imaging results that were available during my care of the patient were reviewed by me and considered in my medical decision making (see chart for details).        Patient seems to be a little altered.  He was given fentanyl for his severe pain.  CT head does not show any obvious pathology.  I think his back pain is probably chronic but given the new chest pain and hypertension a CT was obtained to rule out dissection.  This is negative.  His troponin is quite elevated, concerning for non-STEMI.  He has a  left bundle branch block though he has had this since last year at least.  Discussed with Dr. [name] of cardiology, they will consult and advise IV heparin.  Hospitalist, Dr. Jonelle Sidle will admit. I updated wife on  findings/plan.  Final Clinical Impressions(s) / ED Diagnoses   Final diagnoses:  NSTEMI (non-ST elevated myocardial infarction) [facility])    ED Discharge Orders    None       Sherwood Gambler, MD 03/21/19 1501    Sherwood Gambler, MD 03/21/19 1501

## 2019-03-21 NOTE — ED Notes (Signed)
ED TO INPATIENT HANDOFF REPORT  ED Nurse Name and Phone #: [phone] Tiffany  S Name/Age/Gender Aaron Schmidt 85 y.o. male Room/Bed: 017C/017C  Code Status   Code Status: Prior  Home/SNF/Other Home Patient oriented to: self and place Is this baseline? Yes   Triage Complete: Triage complete  Chief Complaint Chest and back pain  Triage Note Pt here c/o chest pain that began 2 days ago along with lower back pain ; pt states that he has chronic back  Pain and is usually able to walk around the house , but was not able to get out bed today ; pt report slight sob    Allergies Allergies  Allergen Reactions  . Diazepam Other (See Comments)    "makes goofy"    Level of Care/Admitting Diagnosis ED Disposition    ED Disposition Condition Comment   Admit  Hospital Area: [place] [[id number]]  Level of Care: Progressive [102]  Covid Evaluation: Screening Protocol (No Symptoms)  Diagnosis: NSTEMI (non-ST elevated myocardial infarction) [facility]) [[id number]]  Admitting Physician: Elwyn Reach [[id number]]  Attending Physician: Elwyn Reach [[id number]]  Estimated length of stay: past midnight tomorrow  Certification:: I certify this patient will need inpatient services for at least 2 midnights  PT Class (Do Not Modify): Inpatient [101]  PT Acc Code (Do Not Modify): Private [1]       B Medical/Surgery History Past Medical History:  Diagnosis Date  . Arthritis    Degeneration spine & stenosis  . History of kidney stones   . Hypertension    has a histroy of  . Hypotension   . Sleep apnea 2012   used CPAP 2 yrs. ago, feels he sleeps better w/o, no longer using    Past Surgical History:  Procedure Laterality Date  . ARM NEUROPLASTY     at 14 yrs. of age- fell off tractor- had fracture & repair *& later- 61's had  transplantation of a nerve at the elbow  . BACK SURGERY     x4 back surgery x2 fusion -  . CARPAL TUNNEL RELEASE Right   . COLONOSCOPY    .  ESOPHAGOGASTRODUODENOSCOPY (EGD) WITH PROPOFOL N/A 09/23/2017   Procedure: ESOPHAGOGASTRODUODENOSCOPY (EGD) WITH PROPOFOL;  Surgeon: Laurence Spates, MD;  Location: [place];  Service: Endoscopy;  Laterality: N/A;  . PLEURAL EFFUSION DRAINAGE Left 09/09/2017   Procedure: DRAINAGE OF PLEURAL EFFUSION;  Surgeon: [name], MD;  Location: [place];  Service: Thoracic;  Laterality: Left;  . POSTERIOR CERVICAL FUSION/FORAMINOTOMY N/A 06/24/2014   Procedure: Posterior Cervical Three-Seven Fusion with Lateral Mass Fixation;  Surgeon: Erline Levine, MD;  Location: [place] [facility];  Service: Neurosurgery;  Laterality: N/A;  C3-C7 posterior cervical fusion with lateral mass fixation  . SHOULDER SURGERY Bilateral   . VIDEO ASSISTED THORACOSCOPY (VATS)/DECORTICATION Left 09/09/2017   Procedure: VIDEO ASSISTED THORACOSCOPY (VATS)/DECORTICATION;  Surgeon: [name], MD;  Location: [place];  Service: Thoracic;  Laterality: Left;  Marland Kitchen VIDEO BRONCHOSCOPY  09/09/2017   Procedure: VIDEO BRONCHOSCOPY;  Surgeon: [name], MD;  Location: [place];  Service: Thoracic;;     A IV Location/Drains/Wounds Patient Lines/Drains/Airways Status   Active Line/Drains/Airways    Name:   Placement date:   Placement time:   Site:   Days:   Peripheral IV 05/22/18 Left Arm   05/22/18    1300    Arm   303   Peripheral IV 03/21/19 Right Antecubital   03/21/19    1101  Antecubital   less than 1   Incision (Closed) 09/21/17   09/21/17    0802     546   Incision (Closed) 09/20/17 Chest Left;Lower   09/20/17    2320     547          Intake/Output Last 24 hours No intake or output data in the 24 hours ending 03/21/19 1455  Labs/Imaging Results for orders placed or performed during the hospital encounter of 03/21/19 (from the past 48 hour(s))  CBG monitoring, ED     Status: None   Collection Time: 03/21/19 10:51 AM  Result Value Ref Range   Glucose-Capillary 95 70 - 99 mg/dL  Comprehensive metabolic  panel     Status: Abnormal   Collection Time: 03/21/19 10:59 AM  Result Value Ref Range   Sodium 140 135 - 145 mmol/L   Potassium 4.0 3.5 - 5.1 mmol/L   Chloride 103 98 - 111 mmol/L   CO2 25 22 - 32 mmol/L   Glucose, Bld 122 (H) 70 - 99 mg/dL   BUN 17 8 - 23 mg/dL   Creatinine, Ser 0.96 0.61 - 1.24 mg/dL   Calcium 9.2 8.9 - 10.3 mg/dL   Total Protein 6.1 (L) 6.5 - 8.1 g/dL   Albumin 3.8 3.5 - 5.0 g/dL   AST 63 (H) 15 - 41 U/L   ALT 25 0 - 44 U/L   Alkaline Phosphatase 76 38 - 126 U/L   Total Bilirubin 1.1 0.3 - 1.2 mg/dL   GFR calc non Af Amer >60 >60 mL/min   GFR calc Af Amer >60 >60 mL/min   Anion gap 12 5 - 15    Comment: Performed at [facility], [address], [place], [place] [address]  CBC with Differential     Status: Abnormal   Collection Time: 03/21/19 10:59 AM  Result Value Ref Range   WBC 7.7 4.0 - 10.5 K/uL   RBC 4.59 4.22 - 5.81 MIL/uL   Hemoglobin 14.0 13.0 - 17.0 g/dL   HCT 43.6 39.0 - 52.0 %   MCV 95.0 80.0 - 100.0 fL   MCH 30.5 26.0 - 34.0 pg   MCHC 32.1 30.0 - 36.0 g/dL   RDW 13.2 11.5 - 15.5 %   Platelets 144 (L) 150 - 400 K/uL   nRBC 0.0 0.0 - 0.2 %   Neutrophils Relative % 88 %   Neutro Abs 6.7 1.7 - 7.7 K/uL   Lymphocytes Relative 5 %   Lymphs Abs 0.4 (L) 0.7 - 4.0 K/uL   Monocytes Relative 7 %   Monocytes Absolute 0.5 0.1 - 1.0 K/uL   Eosinophils Relative 0 %   Eosinophils Absolute 0.0 0.0 - 0.5 K/uL   Basophils Relative 0 %   Basophils Absolute 0.0 0.0 - 0.1 K/uL   Immature Granulocytes 0 %   Abs Immature Granulocytes 0.02 0.00 - 0.07 K/uL    Comment: Performed at [facility] [address] [place], [place] [address]  Troponin I (High Sensitivity)     Status: Abnormal   Collection Time: 03/21/19 10:59 AM  Result Value Ref Range   Troponin I (High Sensitivity) 2,263 (HH) <18 ng/L    Comment: CRITICAL RESULT CALLED TO, READ BACK BY AND VERIFIED WITH: T.HAMILTON,RN @ 1203 03/21/2019 WEBBERJ (NOTE) Elevated high sensitivity  troponin I (hsTnI) values and significant  changes across serial measurements may suggest ACS but many other  chronic and acute conditions are known to elevate hsTnI  results.  Refer to the Links section for chest pain algorithms and additional  guidance. Performed at [facility], [place] [address], [place], [place] [address]   Urinalysis, Routine w reflex microscopic     Status: Abnormal   Collection Time: 03/21/19 11:12 AM  Result Value Ref Range   Color, Urine YELLOW YELLOW   APPearance CLEAR CLEAR   Specific Gravity, Urine 1.017 1.005 - 1.030   pH 6.0 5.0 - 8.0   Glucose, UA NEGATIVE NEGATIVE mg/dL   Hgb urine dipstick NEGATIVE NEGATIVE   Bilirubin Urine NEGATIVE NEGATIVE   Ketones, ur 80 (A) NEGATIVE mg/dL   Protein, ur NEGATIVE NEGATIVE mg/dL   Nitrite NEGATIVE NEGATIVE   Leukocytes,Ua NEGATIVE NEGATIVE    Comment: Performed at [facility] [address], [place], [place] [address]  I-stat chem 8, ED (not at [facility] or [facility])     Status: Abnormal   Collection Time: 03/21/19 11:12 AM  Result Value Ref Range   Sodium 139 135 - 145 mmol/L   Potassium 4.0 3.5 - 5.1 mmol/L   Chloride 103 98 - 111 mmol/L   BUN 19 8 - 23 mg/dL   Creatinine, Ser 0.80 0.61 - 1.24 mg/dL   Glucose, Bld 119 (H) 70 - 99 mg/dL   Calcium, Ion 1.14 (L) 1.15 - 1.40 mmol/L   TCO2 28 22 - 32 mmol/L   Hemoglobin 14.6 13.0 - 17.0 g/dL   HCT 43.0 39.0 - 52.0 %  SARS Coronavirus 2 (CEPHEID - Performed in [facility] lab), Hosp Order     Status: None   Collection Time: 03/21/19  1:02 PM   Specimen: Nasopharyngeal Swab  Result Value Ref Range   SARS Coronavirus 2 NEGATIVE NEGATIVE    Comment: (NOTE) If result is NEGATIVE SARS-CoV-2 target nucleic acids are NOT DETECTED. The SARS-CoV-2 RNA is generally detectable in upper and lower  respiratory specimens during the acute phase of infection. The lowest  concentration of SARS-CoV-2 viral copies this assay can detect is 250  copies / mL. A  negative result does not preclude SARS-CoV-2 infection  and should not be used as the sole basis for treatment or other  patient management decisions.  A negative result may occur with  improper specimen collection / handling, submission of specimen other  than nasopharyngeal swab, presence of viral mutation(s) within the  areas targeted by this assay, and inadequate number of viral copies  (<250 copies / mL). A negative result must be combined with clinical  observations, patient history, and epidemiological information. If result is POSITIVE SARS-CoV-2 target nucleic acids are DETECTED. The SARS-CoV-2 RNA is generally detectable in upper and lower  respiratory specimens dur ing the acute phase of infection.  Positive  results are indicative of active infection with SARS-CoV-2.  Clinical  correlation with patient history and other diagnostic information is  necessary to determine patient infection status.  Positive results do  not rule out bacterial infection or co-infection with other viruses. If result is PRESUMPTIVE POSTIVE SARS-CoV-2 nucleic acids MAY BE PRESENT.   A presumptive positive result was obtained on the submitted specimen  and confirmed on repeat testing.  While 2019 novel coronavirus  (SARS-CoV-2) nucleic acids may be present in the submitted sample  additional confirmatory testing may be necessary for epidemiological  and / or clinical management purposes  to differentiate between  SARS-CoV-2 and other Sarbecovirus currently known to infect humans.  If clinically indicated additional testing with an alternate test  methodology [phone]) is advised. The SARS-CoV-2 RNA is generally  detectable in upper and lower respiratory sp ecimens during the acute  phase of infection. The expected result is Negative. Fact Sheet for Patients:  StrictlyIdeas.no Fact Sheet for Healthcare Providers: BankingDealers.co.za This test is not  yet approved or cleared by the [place] FDA and has been authorized for detection and/or diagnosis of SARS-CoV-2 by FDA under an Emergency Use Authorization (EUA).  This EUA will remain in effect (meaning this test can be used) for the duration of the COVID-19 declaration under Section 564(b)(1) of the Act, 21 U.S.C. section 360bbb-3(b)(1), unless the authorization is terminated or revoked sooner. Performed at [facility], [place] [address], [place], [place] [address]   Troponin I (High Sensitivity)     Status: Abnormal   Collection Time: 03/21/19  1:06 PM  Result Value Ref Range   Troponin I (High Sensitivity) 2,126 (HH) <18 ng/L    Comment: DELTA CHECK NOTED CRITICAL VALUE NOTED.  VALUE IS CONSISTENT WITH PREVIOUSLY REPORTED AND CALLED VALUE. (NOTE) Elevated high sensitivity troponin I (hsTnI) values and significant  changes across serial measurements may suggest ACS but many other  chronic and acute conditions are known to elevate hsTnI results.  Refer to the Links section for chest pain algorithms and additional  guidance. Performed at [facility], [place] [address], [place], [place] [address]    Ct Head Wo Contrast  Result Date: 03/21/2019 CLINICAL DATA:  Altered mental status. Altered level of consciousness, unexplained. EXAM: CT HEAD WITHOUT CONTRAST TECHNIQUE: Contiguous axial images were obtained from the base of the skull through the vertex without intravenous contrast. COMPARISON:  CT scan dated 05/22/2018 FINDINGS: Brain: There is no acute infarction, hemorrhage, or mass lesion. There is diffuse cerebral cortical atrophy. Several tiny areas of periventricular white matter lucency consistent with chronic small vessel ischemic disease, stable. Vascular: No hyperdense vessel or unexpected calcification. Skull: Normal. Negative for fracture or focal lesion. Sinuses/Orbits: No significant abnormality. Small retention cyst in the base of the left maxillary sinus. Other:  None. IMPRESSION: No acute intracranial abnormality. Diffuse mild atrophy, stable. Minimal small vessel ischemic changes, stable. Electronically Signed   By: Lorriane Shire M.D.   On: 03/21/2019 12:40   Dg Chest Portable 1 View  Result Date: 03/21/2019 CLINICAL DATA:  Chest pain starting 2 days ago, some shortness of breath. EXAM: PORTABLE CHEST 1 VIEW COMPARISON:  Chest x-rays dated 10/08/2017 and 10/02/2017. FINDINGS: Heart size and mediastinal contours are stable. Chronic scarring/atelectasis at the LEFT lung base. No new lung findings. No pneumothorax seen. Osseous structures about the chest are unremarkable. IMPRESSION: No active disease. No evidence of pneumonia or pulmonary edema. Electronically Signed   By: Franki Cabot M.D.   On: 03/21/2019 11:28   Ct Angio Chest/abd/pel For Dissection W And/or Wo Contrast  Result Date: 03/21/2019 CLINICAL DATA:  Chest pain, lower back pain, aortic dissection suspected EXAM: CT ANGIOGRAPHY CHEST, ABDOMEN AND PELVIS TECHNIQUE: Multidetector CT imaging through the chest, abdomen and pelvis was performed using the standard protocol during bolus administration of intravenous contrast. Multiplanar reconstructed images and MIPs were obtained and reviewed to evaluate the vascular anatomy. CONTRAST:  134mL OMNIPAQUE IOHEXOL 350 MG/ML SOLN COMPARISON:  CT chest angiogram, 08/13/2017 FINDINGS: CTA CHEST FINDINGS Cardiovascular: Preferential opacification of the thoracic aorta. No evidence of thoracic aortic aneurysm or dissection. Mild aortic atherosclerosis. Cardiomegaly. No pericardial effusion. Mediastinum/Nodes: No enlarged mediastinal, hilar, or axillary lymph nodes. Thyroid gland, trachea, and esophagus demonstrate  no significant findings. Lungs/Pleura: Small left, trace right pleural effusions with associated atelectasis or consolidation. No pleural effusion or pneumothorax. Musculoskeletal: No chest wall abnormality. No acute or significant osseous findings. Review  of the MIP images confirms the above findings. CTA ABDOMEN AND PELVIS FINDINGS VASCULAR Normal contour and caliber of the abdominal aorta. No evidence of aneurysm or dissection. Mild atherosclerosis. 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 Standard branching pattern of the aorta. Review of the MIP images confirms the above findings. NON-VASCULAR Hepatobiliary: No solid liver abnormality is seen. The gallbladder is distended. No gallbladder wall thickening, or biliary dilatation. Pancreas: Unremarkable. No pancreatic ductal dilatation or surrounding inflammatory changes. Spleen: Normal in size without significant abnormality. Adrenals/Urinary Tract: Small, benign fat containing nodule of the right adrenal gland. Kidneys are normal, without renal calculi, solid lesion, or hydronephrosis. Bladder is unremarkable. Stomach/Bowel: Stomach is within normal limits. Appendix appears normal. No evidence of bowel wall thickening, distention, or inflammatory changes. Pancolonic diverticulosis. There is a very redundant sigmoid colon, with a moderate burden of stool throughout. Vascular/Lymphatic: No significant vascular findings are present. No enlarged abdominal or pelvic lymph nodes. Reproductive: No mass or other significant abnormality. Other: No abdominal wall hernia or abnormality. No abdominopelvic ascites. Musculoskeletal: No acute or significant osseous findings. Posterior fusion and discectomy of L3-L4. Ankylosis of the spine. Review of the MIP images confirms the above findings. IMPRESSION: 1. Normal contour and caliber of the aorta without evidence of aneurysm, dissection, or other acute aortic syndrome. Mild atherosclerosis. 2. 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 This appears slightly enlarged in comparison to the vessel as incidentally included on prior CT chest angiogram dated 08/13/2017, at which time it measured  1.1 cm when measured similarly. 3. The gallbladder is distended. No obstructing calculus or biliary ductal dilatation is noted. Consider dedicated gallbladder ultrasound to further evaluate if gallbladder pathology is suspected. 4. Other chronic, incidental, and postoperative findings of the chest, abdomen, and pelvis as detailed above. Electronically Signed   By: Eddie Candle M.D.   On: 03/21/2019 12:47    Pending Labs Unresulted Labs (From admission, onward)    Start     Ordered   03/22/19 0500  Heparin level (unfractionated)  Daily,   R     03/21/19 1250   03/22/19 0500  CBC  Daily,   R     03/21/19 1250   03/22/19 0500  Lipid panel  Tomorrow morning,   R     03/21/19 1448   03/21/19 2200  Heparin level (unfractionated)  Once-Timed,   STAT     03/21/19 1250   Signed and Held  Troponin I (High Sensitivity)  STAT Now then every 2 hours,   STAT     Signed and Held          Vitals/Pain Today's Vitals   03/21/19 1315 03/21/19 1330 03/21/19 1345 03/21/19 1415  BP: (!) 166/83 (!) 165/85 (!) 168/80 (!) 167/84  Pulse: 95 99 93 90  Resp: 14 17 13 12   Temp:      TempSrc:      SpO2: 99% 99% 100% 100%  Weight:      Height:      PainSc:        Isolation Precautions No active isolations  Medications Medications  nitroGLYCERIN (NITROSTAT) SL tablet 0.4 mg (has no administration in time range)  heparin ADULT infusion 100  units/mL (25000 units/219mL sodium chloride 0.45%) (1,000 Units/hr Intravenous New Bag/Given 03/21/19 1321)  nitroGLYCERIN 50 mg in dextrose 5 % 250 mL (0.2 mg/mL) infusion (has no administration in time range)  morphine 4 MG/ML injection 4 mg (has no administration in time range)  atorvastatin (LIPITOR) tablet 40 mg (has no administration in time range)  fentaNYL (SUBLIMAZE) injection 50 mcg (50 mcg Intravenous Given 03/21/19 1116)  iohexol (OMNIPAQUE) 350 MG/ML injection 100 mL (100 mLs Intravenous Contrast Given 03/21/19 1155)  fentaNYL (SUBLIMAZE) injection 50 mcg  (50 mcg Intravenous Given 03/21/19 1256)  aspirin chewable tablet 324 mg (324 mg Oral Given 03/21/19 1256)  heparin bolus via infusion 4,000 Units (4,000 Units Intravenous Bolus from Bag 03/21/19 1321)    Mobility manual wheelchair Moderate fall risk   Focused Assessments Cardiac Assessment Handoff:  Cardiac Rhythm: Sinus tachycardia Lab Results  Component Value Date   TROPONINI <0.03 05/22/2018   Lab Results  Component Value Date   DDIMER 0.81 (H) 08/13/2017   Does the Patient currently have chest pain? Yes     R Recommendations: See Admitting Provider Note  Report given to:   Additional Notes: Pt is very hard of hearing.  Will start 2nd IV prior to transport and administer Morphine as ordered.  Pt c/o headache and back pain.  Pt is confused at baseline

## 2019-03-21 NOTE — H&P (Signed)
History and Physical   Aaron Schmidt:[id number] DOB: 12/28/1932 DOA: 03/21/2019  Referring MD/NP/PA: Dr. Verta Ellen  PCP: Hulan Fess, MD   Outpatient Specialists: None  Patient coming from: Home  Chief Complaint: Back pain and chest pain  HPI: Aaron Schmidt is a 85 y.o. male with medical history significant of hypertension, osteoarthritis, obstructive sleep apnea, hyperlipidemia, history of left bundle branch block, previous pleural effusion and cervical myelopathy with cervical radiculopathy who came into the ER with severe back pain rated as 10 out of 10 involving the mid back.  Associated with chest pain which is nonspecific.  Nonradiating.  Pain was localized to the anterior chest and then to the left side.  Some nausea but no vomiting no diaphoresis.  Patient was seen in the ER and has ongoing chest pain.  Initial treatment with nitroglycerin and fentanyl has relieved the pain.  He still has the back pain however.  Patient's troponin was markedly elevated.  His EKG was slightly changed.  Diagnosis of non-ST elevation MI has been made.  Cardiology has evaluated patient but recommend medical admission due to confusion.  He is however able to communicate with me.  Wife is available over the phone and filled in the gaps.  We will admit the patient to progressive care and has cardiology follow-up..  ED Course: Temperature is 98.5 blood pressure was 171/78 pulse 99 respirate of 18 oxygen sat 98% on room air.  White count 7.7 hemoglobin 14.6 and platelets 144.  Sodium 139 potassium 4.0 chloride 103 CO2 25.  BUN 19 creatinine 1.80 and calcium 9.2.  Initial troponin 2263 which is equivalent to 2.2.COVID-19 assay is negative.  Chest x-ray showed no acute disease, head CT without contrast is negative.  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  Otherwise no significant findings.  Patient being admitted  with non-ST elevation MI.  Review of Systems: As per HPI otherwise 10 point review of systems negative.    Past Medical History:  Diagnosis Date   Arthritis    Degeneration spine & stenosis   History of kidney stones    Hypertension    has a histroy of   Hypotension    Sleep apnea 2012   used CPAP 2 yrs. ago, feels he sleeps better w/o, no longer using     Past Surgical History:  Procedure Laterality Date   ARM NEUROPLASTY     at 14 yrs. of age- fell off tractor- had fracture & repair *& later- 1990's had  transplantation of a nerve at the elbow   BACK SURGERY     x4 back surgery x2 fusion -   CARPAL TUNNEL RELEASE Right    COLONOSCOPY     ESOPHAGOGASTRODUODENOSCOPY (EGD) WITH PROPOFOL N/A 09/23/2017   Procedure: ESOPHAGOGASTRODUODENOSCOPY (EGD) WITH PROPOFOL;  Surgeon: Laurence Spates, MD;  Location: [facility];  Service: Endoscopy;  Laterality: N/A;   PLEURAL EFFUSION DRAINAGE Left 09/09/2017   Procedure: DRAINAGE OF PLEURAL EFFUSION;  Surgeon: [name], MD;  Location: [facility];  Service: Thoracic;  Laterality: Left;   POSTERIOR CERVICAL FUSION/FORAMINOTOMY N/A 06/24/2014   Procedure: Posterior Cervical Three-Seven Fusion with Lateral Mass Fixation;  Surgeon: Erline Levine, MD;  Location: [facility];  Service: Neurosurgery;  Laterality: N/A;  C3-C7 posterior cervical fusion with lateral mass fixation   SHOULDER SURGERY Bilateral    VIDEO ASSISTED THORACOSCOPY (VATS)/DECORTICATION Left 09/09/2017   Procedure: VIDEO ASSISTED THORACOSCOPY (  VATS)/DECORTICATION;  Surgeon: [name], MD;  Location: [facility];  Service: Thoracic;  Laterality: Left;   VIDEO BRONCHOSCOPY  09/09/2017   Procedure: VIDEO BRONCHOSCOPY;  Surgeon: [name], MD;  Location: [facility];  Service: Thoracic;;     reports that he quit smoking about 40 years ago. His smoking use included pipe. He quit after 24.00 years of use. He has never used smokeless tobacco. He reports  current alcohol use. He reports that he does not use drugs.  Allergies  Allergen Reactions   Diazepam Other (See Comments)    "makes goofy"    Family History  Problem Relation Age of Onset   Pancreatic cancer Mother      Prior to Admission medications   Medication Sig Start Date End Date Taking? Authorizing Provider  Biotin 10000 MCG TABS Take 1 tablet by mouth daily.   Yes [provider]  Cholecalciferol (VITAMIN D) 2000 UNITS CAPS Take 2,000 Units by mouth daily.    Yes [provider]  Ferrous Sulfate (IRON) 325 (65 Fe) MG TABS Take 1 tablet by mouth every other day.    Yes [provider]  finasteride (PROSCAR) 5 MG tablet Take 5 mg by mouth daily.   Yes [provider]  Flaxseed, Linseed, (FLAX SEED OIL) 1000 MG CAPS Take 1,000 mg by mouth daily.    Yes [provider]  folic acid (FOLVITE) 017 MCG tablet Take 400 mcg by mouth daily.   Yes [provider]  HYDROmorphone (DILAUDID) 2 MG tablet Take 2 mg by mouth 4 (four) times daily as needed for severe pain.   Yes [provider]  L-Lysine 500 MG CAPS Take 500 mg by mouth daily.    Yes [provider]  mineral oil liquid Take 15 mLs by mouth at bedtime.    Yes [provider]  mineral oil liquid Take 15 mLs by mouth daily as needed for moderate constipation.   Yes [provider]  Multiple Vitamin (MULTIVITAMIN WITH MINERALS) TABS tablet Take 1 tablet by mouth daily. Mature mens 50+   Yes [provider]  NIACIN CR PO Take 25 mg by mouth daily.   Yes [provider]  nortriptyline (PAMELOR) 25 MG capsule Take 25 mg by mouth at bedtime.    Yes [provider]  OVER THE COUNTER MEDICATION Take 6 tablets by mouth daily. Perdiem 15 mg   Yes [provider]  PANTOTHENIC ACID PO Take 5.5 mg by mouth daily.   Yes [provider]  polyethylene glycol (MIRALAX / GLYCOLAX) packet Take 17 g by mouth daily.  10/13/17  Yes Sherwood Gambler, MD  pregabalin (LYRICA) 150 MG capsule Take 150 mg by mouth 2 (two) times daily.   Yes [provider]  pyridOXINE (VITAMIN B-6) 100 MG tablet Take 100 mg by mouth daily.     Yes [provider]  saw palmetto 80 MG capsule Take 240 mg by mouth daily.    Yes [provider]  Senna-Psyllium (PERDIEM PO) Take 7 tablets by mouth daily.    Yes [provider]  simvastatin (ZOCOR) 40 MG tablet Take 40 mg by mouth at bedtime.    Yes [provider]  SODIUM CHLORIDE PO Take by mouth daily. 10 mg   Yes [provider]  tiZANidine (ZANAFLEX) 2 MG tablet Take 2 mg by mouth 3 (three) times daily.    Yes [provider]  vitamin B-12 (CYANOCOBALAMIN) 100 MCG tablet Take  100 mcg by mouth daily.   Yes [provider]  vitamin C (ASCORBIC ACID) 500 MG tablet Take 500 mg by mouth daily.   Yes [provider]    Physical Exam: Vitals:   03/21/19 1445 03/21/19 1500 03/21/19 1515 03/21/19 1617  BP: (!) 163/80 (!) 162/79 (!) 157/81 (!) 161/77  Pulse: 92 90 92 90  Resp: 16 18 12 15   Temp:    98.1 F (36.7 C)  TempSrc:    Oral  SpO2: 99% 99% 100% 98%  Weight:      Height:          Constitutional: Patient moving in bed in pain in obvious distress Vitals:   03/21/19 1445 03/21/19 1500 03/21/19 1515 03/21/19 1617  BP: (!) 163/80 (!) 162/79 (!) 157/81 (!) 161/77  Pulse: 92 90 92 90  Resp: 16 18 12 15   Temp:    98.1 F (36.7 C)  TempSrc:    Oral  SpO2: 99% 99% 100% 98%  Weight:      Height:       Eyes: PERRL, lids and conjunctivae normal ENMT: Mucous membranes are moist. Posterior pharynx clear of any exudate or lesions.Normal dentition.  Neck: normal, supple, no masses, no thyromegaly Respiratory: clear to auscultation bilaterally, no wheezing, no crackles. Normal respiratory effort. No accessory muscle use.  Cardiovascular: Tachycardia no murmurs / rubs / gallops. No extremity edema. 2+  pedal pulses. No carotid bruits.  Abdomen: no tenderness, no masses palpated. No hepatosplenomegaly. Bowel sounds positive.  Musculoskeletal: no clubbing / cyanosis. No joint deformity upper and lower extremities. Good ROM, no contractures. Normal muscle tone.  Skin: no rashes, lesions, ulcers. No induration Neurologic: CN 2-12 grossly intact. Sensation intact, DTR normal. Strength 5/5 in all 4.  Psychiatric: Mild confusion, patient aware of his location however and his diagnosis    Labs on Admission: I have personally reviewed following labs and imaging studies  CBC: Recent Labs  Lab 03/21/19 1059 03/21/19 1112  WBC 7.7  --   NEUTROABS 6.7  --   HGB 14.0 14.6  HCT 43.6 43.0  MCV 95.0  --   PLT 144*  --    Basic Metabolic Panel: Recent Labs  Lab 03/21/19 1059 03/21/19 1112  NA 140 139  K 4.0 4.0  CL 103 103  CO2 25  --   GLUCOSE 122* 119*  BUN 17 19  CREATININE 0.96 0.80  CALCIUM 9.2  --    GFR: Estimated Creatinine Clearance: 72.1 mL/min (by C-G formula based on SCr of 0.8 mg/dL). Liver Function Tests: Recent Labs  Lab 03/21/19 1059  AST 63*  ALT 25  ALKPHOS 76  BILITOT 1.1  PROT 6.1*  ALBUMIN 3.8   No results for input(s): LIPASE, AMYLASE in the last 168 hours. No results for input(s): AMMONIA in the last 168 hours. Coagulation Profile: No results for input(s): INR, PROTIME in the last 168 hours. Cardiac Enzymes: No results for input(s): CKTOTAL, CKMB, CKMBINDEX, TROPONINI in the last 168 hours. BNP (last 3 results) No results for input(s): PROBNP in the last 8760 hours. HbA1C: No results for input(s): HGBA1C in the last 72 hours. CBG: Recent Labs  Lab 03/21/19 1051  GLUCAP 95   Lipid Profile: No results for input(s): CHOL, HDL, LDLCALC, TRIG, CHOLHDL, LDLDIRECT in the last 72 hours. Thyroid Function Tests: No results for input(s): TSH, T4TOTAL, FREET4, T3FREE, THYROIDAB in the last 72 hours. Anemia Panel: No results for input(s): VITAMINB12,  FOLATE, FERRITIN, TIBC, IRON, RETICCTPCT  in the last 72 hours. Urine analysis:    Component Value Date/Time   COLORURINE YELLOW 03/21/2019 1112   APPEARANCEUR CLEAR 03/21/2019 1112   LABSPEC 1.017 03/21/2019 1112   PHURINE 6.0 03/21/2019 1112   GLUCOSEU NEGATIVE 03/21/2019 1112   HGBUR NEGATIVE 03/21/2019 1112   BILIRUBINUR NEGATIVE 03/21/2019 1112   KETONESUR 80 (A) 03/21/2019 1112   PROTEINUR NEGATIVE 03/21/2019 1112   UROBILINOGEN 0.2 10/20/2010 1625   NITRITE NEGATIVE 03/21/2019 1112   LEUKOCYTESUR NEGATIVE 03/21/2019 1112   Sepsis Labs: @LABRCNTIP (procalcitonin:4,lacticidven:4) ) Recent Results (from the past 240 hour(s))  SARS Coronavirus 2 (CEPHEID - Performed in [facility] hospital lab), Hosp Order     Status: None   Collection Time: 03/21/19  1:02 PM   Specimen: Nasopharyngeal Swab  Result Value Ref Range Status   SARS Coronavirus 2 NEGATIVE NEGATIVE Final    Comment: (NOTE) If result is NEGATIVE SARS-CoV-2 target nucleic acids are NOT DETECTED. The SARS-CoV-2 RNA is generally detectable in upper and lower  respiratory specimens during the acute phase of infection. The lowest  concentration of SARS-CoV-2 viral copies this assay can detect is 250  copies / mL. A negative result does not preclude SARS-CoV-2 infection  and should not be used as the sole basis for treatment or other  patient management decisions.  A negative result may occur with  improper specimen collection / handling, submission of specimen other  than nasopharyngeal swab, presence of viral mutation(s) within the  areas targeted by this assay, and inadequate number of viral copies  (<250 copies / mL). A negative result must be combined with clinical  observations, patient history, and epidemiological information. If result is POSITIVE SARS-CoV-2 target nucleic acids are DETECTED. The SARS-CoV-2 RNA is generally detectable in upper and lower  respiratory specimens dur ing the acute phase of  infection.  Positive  results are indicative of active infection with SARS-CoV-2.  Clinical  correlation with patient history and other diagnostic information is  necessary to determine patient infection status.  Positive results do  not rule out bacterial infection or co-infection with other viruses. If result is PRESUMPTIVE POSTIVE SARS-CoV-2 nucleic acids MAY BE PRESENT.   A presumptive positive result was obtained on the submitted specimen  and confirmed on repeat testing.  While 2019 novel coronavirus  (SARS-CoV-2) nucleic acids may be present in the submitted sample  additional confirmatory testing may be necessary for epidemiological  and / or clinical management purposes  to differentiate between  SARS-CoV-2 and other Sarbecovirus currently known to infect humans.  If clinically indicated additional testing with an alternate test  methodology [phone]) is advised. The SARS-CoV-2 RNA is generally  detectable in upper and lower respiratory sp ecimens during the acute  phase of infection. The expected result is Negative. Fact Sheet for Patients:  StrictlyIdeas.no Fact Sheet for Healthcare Providers: BankingDealers.co.za This test is not yet approved or cleared by the [place] FDA and has been authorized for detection and/or diagnosis of SARS-CoV-2 by FDA under an Emergency Use Authorization (EUA).  This EUA will remain in effect (meaning this test can be used) for the duration of the COVID-19 declaration under Section 564(b)(1) of the Act, 21 U.S.C. section 360bbb-3(b)(1), unless the authorization is terminated or revoked sooner. Performed at [facility], [place] [address], [place], [place] [address]      Radiological Exams on Admission: Ct Head Wo Contrast  Result Date: 03/21/2019 CLINICAL DATA:  Altered mental status. Altered level of consciousness, unexplained. EXAM: CT  HEAD WITHOUT CONTRAST TECHNIQUE: Contiguous  axial images were obtained from the base of the skull through the vertex without intravenous contrast. COMPARISON:  CT scan dated 05/22/2018 FINDINGS: Brain: There is no acute infarction, hemorrhage, or mass lesion. There is diffuse cerebral cortical atrophy. Several tiny areas of periventricular white matter lucency consistent with chronic small vessel ischemic disease, stable. Vascular: No hyperdense vessel or unexpected calcification. Skull: Normal. Negative for fracture or focal lesion. Sinuses/Orbits: No significant abnormality. Small retention cyst in the base of the left maxillary sinus. Other: None. IMPRESSION: No acute intracranial abnormality. Diffuse mild atrophy, stable. Minimal small vessel ischemic changes, stable. Electronically Signed   By: Lorriane Shire M.D.   On: 03/21/2019 12:40   Dg Chest Portable 1 View  Result Date: 03/21/2019 CLINICAL DATA:  Chest pain starting 2 days ago, some shortness of breath. EXAM: PORTABLE CHEST 1 VIEW COMPARISON:  Chest x-rays dated 10/08/2017 and 10/02/2017. FINDINGS: Heart size and mediastinal contours are stable. Chronic scarring/atelectasis at the LEFT lung base. No new lung findings. No pneumothorax seen. Osseous structures about the chest are unremarkable. IMPRESSION: No active disease. No evidence of pneumonia or pulmonary edema. Electronically Signed   By: Franki Cabot M.D.   On: 03/21/2019 11:28   Ct Angio Chest/abd/pel For Dissection W And/or Wo Contrast  Result Date: 03/21/2019 CLINICAL DATA:  Chest pain, lower back pain, aortic dissection suspected EXAM: CT ANGIOGRAPHY CHEST, ABDOMEN AND PELVIS TECHNIQUE: Multidetector CT imaging through the chest, abdomen and pelvis was performed using the standard protocol during bolus administration of intravenous contrast. Multiplanar reconstructed images and MIPs were obtained and reviewed to evaluate the vascular anatomy. CONTRAST:  138mL OMNIPAQUE IOHEXOL 350 MG/ML SOLN COMPARISON:  CT chest angiogram,  08/13/2017 FINDINGS: CTA CHEST FINDINGS Cardiovascular: Preferential opacification of the thoracic aorta. No evidence of thoracic aortic aneurysm or dissection. Mild aortic atherosclerosis. Cardiomegaly. No pericardial effusion. Mediastinum/Nodes: No enlarged mediastinal, hilar, or axillary lymph nodes. Thyroid gland, trachea, and esophagus demonstrate no significant findings. Lungs/Pleura: Small left, trace right pleural effusions with associated atelectasis or consolidation. No pleural effusion or pneumothorax. Musculoskeletal: No chest wall abnormality. No acute or significant osseous findings. Review of the MIP images confirms the above findings. CTA ABDOMEN AND PELVIS FINDINGS VASCULAR Normal contour and caliber of the abdominal aorta. No evidence of aneurysm or dissection. Mild atherosclerosis. 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 Standard branching pattern of the aorta. Review of the MIP images confirms the above findings. NON-VASCULAR Hepatobiliary: No solid liver abnormality is seen. The gallbladder is distended. No gallbladder wall thickening, or biliary dilatation. Pancreas: Unremarkable. No pancreatic ductal dilatation or surrounding inflammatory changes. Spleen: Normal in size without significant abnormality. Adrenals/Urinary Tract: Small, benign fat containing nodule of the right adrenal gland. Kidneys are normal, without renal calculi, solid lesion, or hydronephrosis. Bladder is unremarkable. Stomach/Bowel: Stomach is within normal limits. Appendix appears normal. No evidence of bowel wall thickening, distention, or inflammatory changes. Pancolonic diverticulosis. There is a very redundant sigmoid colon, with a moderate burden of stool throughout. Vascular/Lymphatic: No significant vascular findings are present. No enlarged abdominal or pelvic lymph nodes. Reproductive: No mass or other significant abnormality. Other: No abdominal  wall hernia or abnormality. No abdominopelvic ascites. Musculoskeletal: No acute or significant osseous findings. Posterior fusion and discectomy of L3-L4. Ankylosis of the spine. Review of the MIP images confirms the above findings. IMPRESSION: 1. Normal contour and caliber of the aorta without evidence of  aneurysm, dissection, or other acute aortic syndrome. Mild atherosclerosis. 2. 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 This appears slightly enlarged in comparison to the vessel as incidentally included on prior CT chest angiogram dated 08/13/2017, at which time it measured 1.1 cm when measured similarly. 3. The gallbladder is distended. No obstructing calculus or biliary ductal dilatation is noted. Consider dedicated gallbladder ultrasound to further evaluate if gallbladder pathology is suspected. 4. Other chronic, incidental, and postoperative findings of the chest, abdomen, and pelvis as detailed above. Electronically Signed   By: Eddie Candle M.D.   On: 03/21/2019 12:47    EKG: Independently reviewed.  Showed normal sinus rhythm with a rate of 94.  Left bundle branch block.  ST depression and T wave inversion in the lateral leads V4 5 6.  Left bundle branch block was present back in August 2019.  Assessment/Plan Principal Problem:   NSTEMI (non-ST elevated myocardial infarction) ([place] [facility]) Active Problems:   Essential hypertension   OBSTRUCTIVE SLEEP APNEA   LBBB (left bundle branch block)   Hyperlipidemia   Cervical myelopathy with cervical radiculopathy   Back pain     #1 non-ST elevation MI: Patient will be admitted to progressive care.  Initiate IV heparin and IV nitroglycerin and titrate for comfort.  Pain management.  Cardiology already consulted.  Patient may require cardiac catheterization.  Continue other cardiac medications per cardiology recommendations.  #2 essential hypertension: Continue home regimen.  Further treatment per cardiology.  #3 severe  back pain: Patient has known history of cervical disc disease.  His pain however is in the lower back.  Could still have degenerative disc disease but I suspect pain may be referred pain from his non-ST elevation MI.  Continue pain management for now.  Patient may require MRI down the road to rule out radiculopathy.  #4 obstructive sleep apnea: CPAP at night.  #5 hyperlipidemia: Statin should be continued with.   DVT prophylaxis: Heparin drip Code Status: Full code Family Communication: Wife over the phone Disposition Plan: To be determined Consults called: Cardiology Admission status: Inpatient  Severity of Illness: The appropriate patient status for this patient is INPATIENT. Inpatient status is judged to be reasonable and necessary in order to provide the required intensity of service to ensure the patient's safety. The patient's presenting symptoms, physical exam findings, and initial radiographic and laboratory data in the context of their chronic comorbidities is felt to place them at high risk for further clinical deterioration. Furthermore, it is not anticipated that the patient will be medically stable for discharge from the hospital within 2 midnights of admission. The following factors support the patient status of inpatient.   " The patient's presenting symptoms include chest pain and back pain. " The worrisome physical exam findings include patient in severe distress. " The initial radiographic and laboratory data are worrisome because of troponin of 2.2. " The chronic co-morbidities include hypertension and hyperlipidemia.   * I certify that at the point of admission it is my clinical judgment that the patient will require inpatient hospital care spanning beyond 2 midnights from the point of admission due to high intensity of service, high risk for further deterioration and high frequency of surveillance required.Barbette Merino MD [facility] Pager [phone]  If 7PM-7AM, please contact night-coverage [email or web address] Password [facility]  03/21/2019, 7:11 PM

## 2019-03-21 NOTE — ED Notes (Signed)
Report given to RN on [facility] ready for transport

## 2019-03-22 ENCOUNTER — Inpatient Hospital Stay (HOSPITAL_COMMUNITY): Payer: Medicare Other

## 2019-03-22 DIAGNOSIS — M549 Dorsalgia, unspecified: Secondary | ICD-10-CM

## 2019-03-22 DIAGNOSIS — I340 Nonrheumatic mitral (valve) insufficiency: Secondary | ICD-10-CM

## 2019-03-22 DIAGNOSIS — I361 Nonrheumatic tricuspid (valve) insufficiency: Secondary | ICD-10-CM

## 2019-03-22 DIAGNOSIS — G9340 Encephalopathy, unspecified: Secondary | ICD-10-CM

## 2019-03-22 DIAGNOSIS — G8929 Other chronic pain: Secondary | ICD-10-CM

## 2019-03-22 DIAGNOSIS — K5903 Drug induced constipation: Secondary | ICD-10-CM

## 2019-03-22 DIAGNOSIS — M545 Low back pain: Secondary | ICD-10-CM

## 2019-03-22 LAB — CBC
HCT: 39.1 % (ref 39.0–52.0)
Hemoglobin: 13 g/dL (ref 13.0–17.0)
MCH: 31 pg (ref 26.0–34.0)
MCHC: 33.2 g/dL (ref 30.0–36.0)
MCV: 93.3 fL (ref 80.0–100.0)
Platelets: 144 10*3/uL — ABNORMAL LOW (ref 150–400)
RBC: 4.19 MIL/uL — ABNORMAL LOW (ref 4.22–5.81)
RDW: 13.3 % (ref 11.5–15.5)
WBC: 7.3 10*3/uL (ref 4.0–10.5)
nRBC: 0 % (ref 0.0–0.2)

## 2019-03-22 LAB — HEPARIN LEVEL (UNFRACTIONATED)
Heparin Unfractionated: 0.34 IU/mL (ref 0.30–0.70)
Heparin Unfractionated: 0.27 IU/mL — ABNORMAL LOW (ref 0.30–0.70)

## 2019-03-22 LAB — BASIC METABOLIC PANEL
Anion gap: 10 (ref 5–15)
BUN: 14 mg/dL (ref 8–23)
CO2: 24 mmol/L (ref 22–32)
Calcium: 8.9 mg/dL (ref 8.9–10.3)
Chloride: 104 mmol/L (ref 98–111)
Creatinine, Ser: 0.9 mg/dL (ref 0.61–1.24)
GFR calc Af Amer: >60 mL/min (ref >60)
GFR calc non Af Amer: >60 mL/min (ref >60)
Glucose, Bld: 103 mg/dL — ABNORMAL HIGH (ref 70–99)
Potassium: 4.1 mmol/L (ref 3.5–5.1)
Sodium: 138 mmol/L (ref 135–145)

## 2019-03-22 LAB — LIPID PANEL
Cholesterol: 139 mg/dL (ref 0–200)
HDL: 66 mg/dL (ref >40)
LDL Cholesterol: 59 mg/dL (ref 0–99)
Total CHOL/HDL Ratio: 2.1 RATIO
Triglycerides: 72 mg/dL (ref <150)
VLDL: 14 mg/dL (ref 0–40)

## 2019-03-22 LAB — ECHOCARDIOGRAM COMPLETE
Height: 70.5 in
Weight: 2736 oz

## 2019-03-22 MED ORDER — DICLOFENAC SODIUM 1 % TD GEL
2.0000 g | Freq: Four times a day (QID) | TRANSDERMAL | Status: DC
  Administered 2019-03-22 – 2019-03-24 (×7): 2 g via TOPICAL
  Filled 2019-03-22: qty 100

## 2019-03-22 MED ORDER — PREGABALIN 50 MG PO CAPS
50.0000 mg | ORAL_CAPSULE | Freq: Two times a day (BID) | ORAL | Status: DC
  Administered 2019-03-22 – 2019-03-24 (×5): 50 mg via ORAL
  Filled 2019-03-22 (×5): qty 1

## 2019-03-22 MED ORDER — ACETAMINOPHEN 325 MG PO TABS
650.0000 mg | ORAL_TABLET | Freq: Three times a day (TID) | ORAL | Status: DC
  Administered 2019-03-22 – 2019-03-24 (×6): 650 mg via ORAL
  Filled 2019-03-22 (×6): qty 2

## 2019-03-22 MED ORDER — SENNA 8.6 MG PO TABS
1.0000 | ORAL_TABLET | Freq: Two times a day (BID) | ORAL | Status: DC
  Administered 2019-03-22 – 2019-03-24 (×5): 8.6 mg via ORAL
  Filled 2019-03-22 (×5): qty 1

## 2019-03-22 MED ORDER — ASPIRIN 81 MG PO CHEW
81.0000 mg | CHEWABLE_TABLET | Freq: Every day | ORAL | Status: DC
  Administered 2019-03-22 – 2019-03-24 (×3): 81 mg via ORAL
  Filled 2019-03-22 (×3): qty 1

## 2019-03-22 NOTE — Progress Notes (Signed)
  Echocardiogram 2D Echocardiogram has been performed.  Oneal Deputy Senior 03/22/2019, 11:07 AM

## 2019-03-22 NOTE — Progress Notes (Signed)
ANTICOAGULATION CONSULT NOTE - [facility] for heparin Indication: NSTEMI  Allergies  Allergen Reactions  . Diazepam Other (See Comments)    "makes goofy"    Patient Measurements: Height: 5' 10.5" (179.1 cm) Weight: 171 lb (77.6 kg) IBW/kg (Calculated) : 74.15 Heparin Dosing Weight: 77.6kg  Vital Signs: Temp: 98.8 F (37.1 C) (06/28 2045) Temp Source: Oral (06/28 2045) BP: 146/73 (06/28 1452) Pulse Rate: 76 (06/28 1452)  Labs: Recent Labs    03/21/19 1059 03/21/19 1112 03/21/19 1306 03/21/19 1832 03/21/19 2204 03/22/19 1200 03/22/19 2026  HGB 14.0 14.6  --   --   --  13.0  --   HCT 43.6 43.0  --   --   --  39.1  --   PLT 144*  --   --   --   --  144*  --   HEPARINUNFRC  --   --   --   --  0.21* 0.34 0.27*  CREATININE 0.96 0.80  --   --   --  0.90  --   TROPONINIHS 2,263*  --  2,126* 1,650*  --   --   --     Estimated Creatinine Clearance: 64.1 mL/min (by C-G formula based on SCr of 0.9 mg/dL).   Medications:  Infusions:  . sodium chloride 75 mL/hr at 03/22/19 2113  . heparin 1,250 Units/hr (03/22/19 1444)  . nitroGLYCERIN 5 mcg/min (03/22/19 1443)    Assessment: 85 yo M presented to the ED with CP. Troponin elevated and started on IV heparin. He is not on anticoagulation PTA.   Heparin level is slightly sub-therapeutic at 0.27 on 1250 units/hr. Heparin was off from ~ 1445 - 1545 and level was delayed accordingly.  No bleeding noted, CBC is stable. Plan is for cath tomorrow.  Goal of Therapy:  Heparin level 0.3-0.7 units/ml Monitor platelets by anticoagulation protocol: Yes   Plan:  Increase heparin to 1350 units/hr Next heparin level with AM labs. Daily heparin level and CBC Monitor for s/sx of bleeding   [employer], Pharm.D., BCPS Clinical Pharmacist  **Pharmacist phone directory can now be found on [email or web address] ([email or web address]).  Listed under [place].  03/22/2019 9:33 PM

## 2019-03-22 NOTE — Progress Notes (Signed)
PROGRESS NOTE  ELAN BRAINERD PIR:[id number] DOB: September 20, 1933 DOA: 03/21/2019 PCP: Hulan Fess, MD   LOS: 1 day   Patient is from: Home  Brief Narrative / Interim history: 85 year old male with history of HTN, OSA not on CPAP, HLD, LBBB, BPH, cervical and lumbar radiculopathy status post fusion and chronic pain on opiates presenting with chest pain, lower back pain, mental status change and lower extremity weakness and found to have non-STEMI.  In ED, hemodynamically stable.  CBC not impressive.  Creatinine 1.8 (baseline 1.3-1.5).  BUN 19.  Troponin 2263.  EKG LLLB without Sgarbossa.  CXR and CT head without acute finding.  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  Otherwise no significant finding.  Patient admitted for NSTEMI and started on heparin drip.  Subjective: No major events overnight of this morning.  Complains of back pain which is chronic for him.  He denies radiation to his legs.  Denies numbness or tingling in his legs.  Denies GI or GU symptoms.  Denies chest pain, dyspnea or lightheadedness.  However, patient is poor historian.  He is only oriented to self and partial place.  Objective: Vitals:   03/21/19 1700 03/21/19 1800 03/21/19 1900 03/21/19 2036  BP: (!) 154/78 (!) 156/76 (!) 139/56 139/72  Pulse:    75  Resp: 14 (!) 25 19 15   Temp:    98.1 F (36.7 C)  TempSrc:    Oral  SpO2:    100%  Weight:      Height:        Intake/Output Summary (Last 24 hours) at 03/22/2019 0729 Last data filed at 03/22/2019 0200 Gross per 24 hour  Intake 1237.44 ml  Output -  Net 1237.44 ml   Filed Weights   03/21/19 1200  Weight: 77.6 kg    Examination:  GENERAL: No acute distress.  Appears well.  HEENT: MMM.  Vision and hearing grossly intact.  NECK: Supple.  No apparent JVD. LUNGS:  No IWOB. Good air movement bilaterally. HEART:  RRR.  2/6 SEM over LUSB.  Split S2 over LUSB. ABD: Bowel sounds  present. Soft. Non tender.  MSK/EXT:  Moves all extremities. No apparent deformity. No edema bilaterally.  Tenderness across lower abdomen over lumbar sacral muscles. SKIN: no apparent skin lesion or wound NEURO: Awake, alert and oriented to self and partial place.  No gross deficit.  PSYCH: Calm. Normal affect.   I have personally reviewed the following labs and images: CBC: Recent Labs  Lab 03/21/19 1059 03/21/19 1112  WBC 7.7  --   NEUTROABS 6.7  --   HGB 14.0 14.6  HCT 43.6 43.0  MCV 95.0  --   PLT 144*  --    Basic Metabolic Panel: Recent Labs  Lab 03/21/19 1059 03/21/19 1112  NA 140 139  K 4.0 4.0  CL 103 103  CO2 25  --   GLUCOSE 122* 119*  BUN 17 19  CREATININE 0.96 0.80  CALCIUM 9.2  --    GFR: Estimated Creatinine Clearance: 72.1 mL/min (by C-G formula based on SCr of 0.8 mg/dL). Liver Function Tests: Recent Labs  Lab 03/21/19 1059  AST 63*  ALT 25  ALKPHOS 76  BILITOT 1.1  PROT 6.1*  ALBUMIN 3.8   No results for input(s): LIPASE, AMYLASE in the last 168 hours. No results for input(s): AMMONIA in the last 168 hours. Coagulation Profile: No results for input(s): INR, PROTIME in the last 168 hours. Cardiac  Enzymes: No results for input(s): CKTOTAL, CKMB, CKMBINDEX, TROPONINI in the last 168 hours. BNP (last 3 results) No results for input(s): PROBNP in the last 8760 hours. HbA1C: No results for input(s): HGBA1C in the last 72 hours. CBG: Recent Labs  Lab 03/21/19 1051  GLUCAP 95   Lipid Profile: No results for input(s): CHOL, HDL, LDLCALC, TRIG, CHOLHDL, LDLDIRECT in the last 72 hours. Thyroid Function Tests: No results for input(s): TSH, T4TOTAL, FREET4, T3FREE, THYROIDAB in the last 72 hours. Anemia Panel: No results for input(s): VITAMINB12, FOLATE, FERRITIN, TIBC, IRON, RETICCTPCT in the last 72 hours. Urine analysis:    Component Value Date/Time   COLORURINE YELLOW 03/21/2019 1112   APPEARANCEUR CLEAR 03/21/2019 1112   LABSPEC  1.017 03/21/2019 1112   PHURINE 6.0 03/21/2019 1112   GLUCOSEU NEGATIVE 03/21/2019 1112   HGBUR NEGATIVE 03/21/2019 1112   BILIRUBINUR NEGATIVE 03/21/2019 1112   KETONESUR 80 (A) 03/21/2019 1112   PROTEINUR NEGATIVE 03/21/2019 1112   UROBILINOGEN 0.2 10/20/2010 1625   NITRITE NEGATIVE 03/21/2019 1112   LEUKOCYTESUR NEGATIVE 03/21/2019 1112   Sepsis Labs: Invalid input(s): PROCALCITONIN, LACTICIDVEN  Recent Results (from the past 240 hour(s))  SARS Coronavirus 2 (CEPHEID - Performed in [place] hospital lab), Hosp Order     Status: None   Collection Time: 03/21/19  1:02 PM   Specimen: Nasopharyngeal Swab  Result Value Ref Range Status   SARS Coronavirus 2 NEGATIVE NEGATIVE Final    Comment: (NOTE) If result is NEGATIVE SARS-CoV-2 target nucleic acids are NOT DETECTED. The SARS-CoV-2 RNA is generally detectable in upper and lower  respiratory specimens during the acute phase of infection. The lowest  concentration of SARS-CoV-2 viral copies this assay can detect is 250  copies / mL. A negative result does not preclude SARS-CoV-2 infection  and should not be used as the sole basis for treatment or other  patient management decisions.  A negative result may occur with  improper specimen collection / handling, submission of specimen other  than nasopharyngeal swab, presence of viral mutation(s) within the  areas targeted by this assay, and inadequate number of viral copies  (<250 copies / mL). A negative result must be combined with clinical  observations, patient history, and epidemiological information. If result is POSITIVE SARS-CoV-2 target nucleic acids are DETECTED. The SARS-CoV-2 RNA is generally detectable in upper and lower  respiratory specimens dur ing the acute phase of infection.  Positive  results are indicative of active infection with SARS-CoV-2.  Clinical  correlation with patient history and other diagnostic information is  necessary to determine patient  infection status.  Positive results do  not rule out bacterial infection or co-infection with other viruses. If result is PRESUMPTIVE POSTIVE SARS-CoV-2 nucleic acids MAY BE PRESENT.   A presumptive positive result was obtained on the submitted specimen  and confirmed on repeat testing.  While 2019 novel coronavirus  (SARS-CoV-2) nucleic acids may be present in the submitted sample  additional confirmatory testing may be necessary for epidemiological  and / or clinical management purposes  to differentiate between  SARS-CoV-2 and other Sarbecovirus currently known to infect humans.  If clinically indicated additional testing with an alternate test  methodology [id number]) is advised. The SARS-CoV-2 RNA is generally  detectable in upper and lower respiratory sp ecimens during the acute  phase of infection. The expected result is Negative. Fact Sheet for Patients:  StrictlyIdeas.no Fact Sheet for Healthcare Providers: BankingDealers.co.za This test is not yet approved or cleared by the [place]  States FDA and has been authorized for detection and/or diagnosis of SARS-CoV-2 by FDA under an Emergency Use Authorization (EUA).  This EUA will remain in effect (meaning this test can be used) for the duration of the COVID-19 declaration under Section 564(b)(1) of the Act, 21 U.S.C. section 360bbb-3(b)(1), unless the authorization is terminated or revoked sooner. Performed at [facility], [place] [address] [place], [place] [address]      Radiology Studies: Ct Head Wo Contrast  Result Date: 03/21/2019 CLINICAL DATA:  Altered mental status. Altered level of consciousness, unexplained. EXAM: CT HEAD WITHOUT CONTRAST TECHNIQUE: Contiguous axial images were obtained from the base of the skull through the vertex without intravenous contrast. COMPARISON:  CT scan dated 05/22/2018 FINDINGS: Brain: There is no acute infarction, hemorrhage, or mass lesion.  There is diffuse cerebral cortical atrophy. Several tiny areas of periventricular white matter lucency consistent with chronic small vessel ischemic disease, stable. Vascular: No hyperdense vessel or unexpected calcification. Skull: Normal. Negative for fracture or focal lesion. Sinuses/Orbits: No significant abnormality. Small retention cyst in the base of the left maxillary sinus. Other: None. IMPRESSION: No acute intracranial abnormality. Diffuse mild atrophy, stable. Minimal small vessel ischemic changes, stable. Electronically Signed   By: Lorriane Shire M.D.   On: 03/21/2019 12:40   Dg Chest Portable 1 View  Result Date: 03/21/2019 CLINICAL DATA:  Chest pain starting 2 days ago, some shortness of breath. EXAM: PORTABLE CHEST 1 VIEW COMPARISON:  Chest x-rays dated 10/08/2017 and 10/02/2017. FINDINGS: Heart size and mediastinal contours are stable. Chronic scarring/atelectasis at the LEFT lung base. No new lung findings. No pneumothorax seen. Osseous structures about the chest are unremarkable. IMPRESSION: No active disease. No evidence of pneumonia or pulmonary edema. Electronically Signed   By: Franki Cabot M.D.   On: 03/21/2019 11:28   Ct Angio Chest/abd/pel For Dissection W And/or Wo Contrast  Result Date: 03/21/2019 CLINICAL DATA:  Chest pain, lower back pain, aortic dissection suspected EXAM: CT ANGIOGRAPHY CHEST, ABDOMEN AND PELVIS TECHNIQUE: Multidetector CT imaging through the chest, abdomen and pelvis was performed using the standard protocol during bolus administration of intravenous contrast. Multiplanar reconstructed images and MIPs were obtained and reviewed to evaluate the vascular anatomy. CONTRAST:  159mL OMNIPAQUE IOHEXOL 350 MG/ML SOLN COMPARISON:  CT chest angiogram, 08/13/2017 FINDINGS: CTA CHEST FINDINGS Cardiovascular: Preferential opacification of the thoracic aorta. No evidence of thoracic aortic aneurysm or dissection. Mild aortic atherosclerosis. Cardiomegaly. No pericardial  effusion. Mediastinum/Nodes: No enlarged mediastinal, hilar, or axillary lymph nodes. Thyroid gland, trachea, and esophagus demonstrate no significant findings. Lungs/Pleura: Small left, trace right pleural effusions with associated atelectasis or consolidation. No pleural effusion or pneumothorax. Musculoskeletal: No chest wall abnormality. No acute or significant osseous findings. Review of the MIP images confirms the above findings. CTA ABDOMEN AND PELVIS FINDINGS VASCULAR Normal contour and caliber of the abdominal aorta. No evidence of aneurysm or dissection. Mild atherosclerosis. 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 Standard branching pattern of the aorta. Review of the MIP images confirms the above findings. NON-VASCULAR Hepatobiliary: No solid liver abnormality is seen. The gallbladder is distended. No gallbladder wall thickening, or biliary dilatation. Pancreas: Unremarkable. No pancreatic ductal dilatation or surrounding inflammatory changes. Spleen: Normal in size without significant abnormality. Adrenals/Urinary Tract: Small, benign fat containing nodule of the right adrenal gland. Kidneys are normal, without renal calculi, solid lesion, or hydronephrosis. Bladder is unremarkable. Stomach/Bowel: Stomach is within normal limits.  Appendix appears normal. No evidence of bowel wall thickening, distention, or inflammatory changes. Pancolonic diverticulosis. There is a very redundant sigmoid colon, with a moderate burden of stool throughout. Vascular/Lymphatic: No significant vascular findings are present. No enlarged abdominal or pelvic lymph nodes. Reproductive: No mass or other significant abnormality. Other: No abdominal wall hernia or abnormality. No abdominopelvic ascites. Musculoskeletal: No acute or significant osseous findings. Posterior fusion and discectomy of L3-L4. Ankylosis of the spine. Review of the MIP images confirms the  above findings. IMPRESSION: 1. Normal contour and caliber of the aorta without evidence of aneurysm, dissection, or other acute aortic syndrome. Mild atherosclerosis. 2. 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 This appears slightly enlarged in comparison to the vessel as incidentally included on prior CT chest angiogram dated 08/13/2017, at which time it measured 1.1 cm when measured similarly. 3. The gallbladder is distended. No obstructing calculus or biliary ductal dilatation is noted. Consider dedicated gallbladder ultrasound to further evaluate if gallbladder pathology is suspected. 4. Other chronic, incidental, and postoperative findings of the chest, abdomen, and pelvis as detailed above. Electronically Signed   By: Eddie Candle M.D.   On: 03/21/2019 12:47     Procedures:  2/28-echo with EF of 45 to 50%, moderate concentric LVH, DD, RVSP 43 mm chordae and mild to moderate MVR  Microbiology: COVID-19 negative.  Assessment & Plan: Non-STEMI: Presenting with chest pain.  Elevated troponin now downtrending (2263> 2126> 1650).  EKG with chronic LBBB without Sgarbossa.  Currently chest pain-free but unreliable historian. -Cardiology managing  -Continue heparin drip  -Start aspirin 81 mg daily  -Continue atorvastatin  -Continue nitro drip and PRN morphine  -Continue carvedilol 6.25 mg twice daily  -Check risk stratification labs  -LHC tentatively on 6/9  Acute on chronic encephalopathy: Likely toxic due to multiple sedating medication for chronic pain.  Also difficulty hearing.  Wife concerned about losing his hearing aid if she brings to the hospital.  Oriented to self and partial place this morning.  Improved per wife -Frequent reorientation's -Delirium precautions -Adjust pain medications  OSA: does not use CPAP at home. -Offer CPAP  BPH: Stable -Continue home Proscar  Hypertension: Normotensive -Cardiac meds as above  Chronic lower back  pain/lower extremity weakness: No new MSK finding on CT abdomen and pelvis.  Neuro exam reassuring.  No new GI or GU symptoms.  On p.o. Dilaudid, Lyrica and Zanaflex at home. -Stable Tylenol -Discontinue Dilaudid while on PRN morphine for non-STEMI -Scheduled Tylenol -Voltaren gel -Continue Lyrica at low-dose  Constipation: likely due to opiate.  -Schedule daily Senokot -PRN MiraLAX  DVT prophylaxis: On heparin drip Code Status: Full code Family Communication: Updated patient's wife over the phone Disposition Plan: Remains inpatient for non-STEMI. Consultants: Cardiology  Antimicrobials: Anti-infectives (From admission, onward)   None      Scheduled Meds: . atorvastatin  40 mg Oral q1800  . carvedilol  6.25 mg Oral BID WC   Continuous Infusions: . sodium chloride Stopped (03/21/19 1909)  . sodium chloride 100 mL/hr at 03/22/19 0114  . heparin 1,250 Units/hr (03/22/19 0556)  . nitroGLYCERIN 5 mcg/min (03/22/19 0200)   PRN Meds:.acetaminophen, HYDROmorphone, morphine injection, nitroGLYCERIN, ondansetron (ZOFRAN) IV   Taye T. Eleanor  If 7PM-7AM, please contact night-coverage [email or web address] Password [facility] 03/22/2019, 7:29 AM

## 2019-03-22 NOTE — Progress Notes (Signed)
Progress Note  Patient Name: Aaron Schmidt Date of Encounter: 03/22/2019  Primary Cardiologist: Ena Dawley, MD   Subjective   Does not have much chest pain.  Primary complaint is back pain.  Inpatient Medications    Scheduled Meds:  atorvastatin  40 mg Oral q1800   carvedilol  6.25 mg Oral BID WC   Continuous Infusions:  sodium chloride Stopped (03/21/19 1909)   sodium chloride 100 mL/hr at 03/22/19 0114   heparin 1,250 Units/hr (03/22/19 0556)   nitroGLYCERIN 5 mcg/min (03/22/19 0200)   PRN Meds: acetaminophen, HYDROmorphone, morphine injection, nitroGLYCERIN, ondansetron (ZOFRAN) IV   Vital Signs    Vitals:   03/22/19 0650 03/22/19 0720 03/22/19 0750 03/22/19 0820  BP: (!) 154/70 (!) 156/74 (!) 156/73 118/85  Pulse: 63 72 75 76  Resp: 16 15 (!) 22 18  Temp:      TempSrc:      SpO2: 98% 97% 99% 100%  Weight:      Height:        Intake/Output Summary (Last 24 hours) at 03/22/2019 0902 Last data filed at 03/22/2019 0200 Gross per 24 hour  Intake 1237.44 ml  Output --  Net 1237.44 ml   Filed Weights   03/21/19 1200  Weight: 77.6 kg    Telemetry    Sinus rhythm- Personally Reviewed   Physical Exam   GEN: No acute distress.   Neck: No JVD Cardiac: RRR, no murmurs, rubs, or gallops.  Respiratory: Clear to auscultation bilaterally. GI: Soft, nontender, non-distended  MS: No edema; No deformity. Neuro:  Nonfocal  Psych: Normal affect   Labs    Chemistry Recent Labs  Lab 03/21/19 1059 03/21/19 1112  NA 140 139  K 4.0 4.0  CL 103 103  CO2 25  --   GLUCOSE 122* 119*  BUN 17 19  CREATININE 0.96 0.80  CALCIUM 9.2  --   PROT 6.1*  --   ALBUMIN 3.8  --   AST 63*  --   ALT 25  --   ALKPHOS 76  --   BILITOT 1.1  --   GFRNONAA >60  --   GFRAA >60  --   ANIONGAP 12  --      Hematology Recent Labs  Lab 03/21/19 1059 03/21/19 1112  WBC 7.7  --   RBC 4.59  --   HGB 14.0 14.6  HCT 43.6 43.0  MCV 95.0  --   MCH 30.5  --    MCHC 32.1  --   RDW 13.2  --   PLT 144*  --     Cardiac EnzymesNo results for input(s): TROPONINI in the last 168 hours. No results for input(s): TROPIPOC in the last 168 hours.   BNPNo results for input(s): BNP, PROBNP in the last 168 hours.   DDimer No results for input(s): DDIMER in the last 168 hours.   Radiology    Ct Head Wo Contrast  Result Date: 03/21/2019 CLINICAL DATA:  Altered mental status. Altered level of consciousness, unexplained. EXAM: CT HEAD WITHOUT CONTRAST TECHNIQUE: Contiguous axial images were obtained from the base of the skull through the vertex without intravenous contrast. COMPARISON:  CT scan dated 05/22/2018 FINDINGS: Brain: There is no acute infarction, hemorrhage, or mass lesion. There is diffuse cerebral cortical atrophy. Several tiny areas of periventricular white matter lucency consistent with chronic small vessel ischemic disease, stable. Vascular: No hyperdense vessel or unexpected calcification. Skull: Normal. Negative for fracture or focal lesion. Sinuses/Orbits: No significant abnormality.  Small retention cyst in the base of the left maxillary sinus. Other: None. IMPRESSION: No acute intracranial abnormality. Diffuse mild atrophy, stable. Minimal small vessel ischemic changes, stable. Electronically Signed   By: Lorriane Shire M.D.   On: 03/21/2019 12:40   Dg Chest Portable 1 View  Result Date: 03/21/2019 CLINICAL DATA:  Chest pain starting 2 days ago, some shortness of breath. EXAM: PORTABLE CHEST 1 VIEW COMPARISON:  Chest x-rays dated 10/08/2017 and 10/02/2017. FINDINGS: Heart size and mediastinal contours are stable. Chronic scarring/atelectasis at the LEFT lung base. No new lung findings. No pneumothorax seen. Osseous structures about the chest are unremarkable. IMPRESSION: No active disease. No evidence of pneumonia or pulmonary edema. Electronically Signed   By: Franki Cabot M.D.   On: 03/21/2019 11:28   Ct Angio Chest/abd/pel For Dissection W  And/or Wo Contrast  Result Date: 03/21/2019 CLINICAL DATA:  Chest pain, lower back pain, aortic dissection suspected EXAM: CT ANGIOGRAPHY CHEST, ABDOMEN AND PELVIS TECHNIQUE: Multidetector CT imaging through the chest, abdomen and pelvis was performed using the standard protocol during bolus administration of intravenous contrast. Multiplanar reconstructed images and MIPs were obtained and reviewed to evaluate the vascular anatomy. CONTRAST:  162mL OMNIPAQUE IOHEXOL 350 MG/ML SOLN COMPARISON:  CT chest angiogram, 08/13/2017 FINDINGS: CTA CHEST FINDINGS Cardiovascular: Preferential opacification of the thoracic aorta. No evidence of thoracic aortic aneurysm or dissection. Mild aortic atherosclerosis. Cardiomegaly. No pericardial effusion. Mediastinum/Nodes: No enlarged mediastinal, hilar, or axillary lymph nodes. Thyroid gland, trachea, and esophagus demonstrate no significant findings. Lungs/Pleura: Small left, trace right pleural effusions with associated atelectasis or consolidation. No pleural effusion or pneumothorax. Musculoskeletal: No chest wall abnormality. No acute or significant osseous findings. Review of the MIP images confirms the above findings. CTA ABDOMEN AND PELVIS FINDINGS VASCULAR Normal contour and caliber of the abdominal aorta. No evidence of aneurysm or dissection. Mild atherosclerosis. 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 Standard branching pattern of the aorta. Review of the MIP images confirms the above findings. NON-VASCULAR Hepatobiliary: No solid liver abnormality is seen. The gallbladder is distended. No gallbladder wall thickening, or biliary dilatation. Pancreas: Unremarkable. No pancreatic ductal dilatation or surrounding inflammatory changes. Spleen: Normal in size without significant abnormality. Adrenals/Urinary Tract: Small, benign fat containing nodule of the right adrenal gland. Kidneys are normal, without  renal calculi, solid lesion, or hydronephrosis. Bladder is unremarkable. Stomach/Bowel: Stomach is within normal limits. Appendix appears normal. No evidence of bowel wall thickening, distention, or inflammatory changes. Pancolonic diverticulosis. There is a very redundant sigmoid colon, with a moderate burden of stool throughout. Vascular/Lymphatic: No significant vascular findings are present. No enlarged abdominal or pelvic lymph nodes. Reproductive: No mass or other significant abnormality. Other: No abdominal wall hernia or abnormality. No abdominopelvic ascites. Musculoskeletal: No acute or significant osseous findings. Posterior fusion and discectomy of L3-L4. Ankylosis of the spine. Review of the MIP images confirms the above findings. IMPRESSION: 1. Normal contour and caliber of the aorta without evidence of aneurysm, dissection, or other acute aortic syndrome. Mild atherosclerosis. 2. 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 This appears slightly enlarged in comparison to the vessel as incidentally included on prior CT chest angiogram dated 08/13/2017, at which time it measured 1.1 cm when measured similarly. 3. The gallbladder is distended. No obstructing calculus or biliary ductal dilatation is noted. Consider dedicated gallbladder ultrasound to further evaluate if gallbladder pathology is suspected. 4. Other  chronic, incidental, and postoperative findings of the chest, abdomen, and pelvis as detailed above. Electronically Signed   By: Eddie Candle M.D.   On: 03/21/2019 12:47    Cardiac Studies   Echo for this admission pending  Echocardiogram 2018: Study Conclusions  - Left ventricle: The cavity size was normal. Wall thickness was increased in a pattern of mild LVH. Systolic function was normal. The estimated ejection fraction was in the range of 60% to 65%. Wall motion was normal; there were no regional wall motion abnormalities. Doppler parameters  are consistent with abnormal left ventricular relaxation (grade 1 diastolic dysfunction). - Mitral valve: There was mild regurgitation.  Patient Profile     85 y.o. male with a PMH of HTN, HLD, chronic LBBB, OSA on CPAP, and HOH who is being seen today for the evaluation of chest pain at the request of Dr. Regenia Skeeter.  Assessment & Plan    1. NSTEMI: patient presented with chest pain of unclear duration. He is confused and unable to provide a compelling history. Trop was elevated to [phone]. EKG with chronic LBBB.   - Continue heparin gtt - I will start daily ASA 81 mg\ - I will check CBC/BMET - Continue nitro gtt as chest pain appears to be under control - started carvedilol 6.25 mg po BID on 6/27 -Continue IV hydration as he has not eaten in the last few days and per wife prior episodes of confusion with dehydration -Per wife they would like to proceed with full work-up and therapy as he has been fully functional until a few days ago, if necessary day would agree with cardiac catheterization.  We will tentatively plan for Monday. - Will check lipid panel today  2. HTN: BP normal  - Continue nitro gtt for management of #1 - Further management pending echo and ischemic evaluation  3. HLD:  - Will check lipid panel today - AST mildly elevated - started atorvastatin 40mg  daily on 6/27  For questions or updates, please contact [place] Please consult [email or web address] for contact info under Cardiology/STEMI.      Signed, Kate Sable, MD  03/22/2019, 9:02 AM

## 2019-03-22 NOTE — Progress Notes (Signed)
ANTICOAGULATION CONSULT NOTE - [place] for heparin Indication: NSTEMI  Allergies  Allergen Reactions  . Diazepam Other (See Comments)    "makes goofy"    Patient Measurements: Height: 5' 10.5" (179.1 cm) Weight: 171 lb (77.6 kg) IBW/kg (Calculated) : 74.15 Heparin Dosing Weight: 77.6kg  Vital Signs: BP: 118/85 (06/28 0820) Pulse Rate: 76 (06/28 0820)  Labs: Recent Labs    03/21/19 1059 03/21/19 1112 03/21/19 1306 03/21/19 1832 03/21/19 2204 03/22/19 1200  HGB 14.0 14.6  --   --   --  13.0  HCT 43.6 43.0  --   --   --  39.1  PLT 144*  --   --   --   --  144*  HEPARINUNFRC  --   --   --   --  0.21* 0.34  CREATININE 0.96 0.80  --   --   --  0.90  TROPONINIHS 2,263*  --  2,126* 1,650*  --   --     Estimated Creatinine Clearance: 64.1 mL/min (by C-G formula based on SCr of 0.9 mg/dL).   Medications:  Infusions:  . sodium chloride Stopped (03/21/19 1909)  . sodium chloride 100 mL/hr at 03/22/19 0114  . heparin 1,250 Units/hr (03/22/19 0556)  . nitroGLYCERIN 5 mcg/min (03/22/19 0200)    Assessment: 85 yo M presented to the ED with CP. Troponin elevated and started on IV heparin. He is not on anticoagulation PTA.   Heparin level is therapeutic at 0.34 on 1250 units/hr. No bleeding noted, CBC is stable. Plan is for cath tomorrow.  Goal of Therapy:  Heparin level 0.3-0.7 units/ml Monitor platelets by anticoagulation protocol: Yes   Plan:  Continue heparin gtt at 1250 units/hr Confirmatory heparin level at 16:00 Daily heparin level and CBC Monitor for s/sx of bleeding   Thank you for involving pharmacy in this patient's care.  Renold Genta, PharmD, BCPS Clinical Pharmacist Clinical phone for 03/22/2019 until 3p is [phone] 03/22/2019 1:09 PM  **Pharmacist phone directory can be found on [email or web address] listed under [place]**

## 2019-03-23 ENCOUNTER — Encounter (HOSPITAL_COMMUNITY)
Admission: EM | Disposition: A | Discharge status: Home Health | Payer: Self-pay | Source: Home / Self Care | Attending: Student

## 2019-03-23 ENCOUNTER — Encounter (HOSPITAL_COMMUNITY): Payer: Self-pay | Admitting: Cardiovascular Disease

## 2019-03-23 DIAGNOSIS — E440 Moderate protein-calorie malnutrition: Secondary | ICD-10-CM | POA: Insufficient documentation

## 2019-03-23 HISTORY — PX: CARDIAC CATHETERIZATION: SHX172

## 2019-03-23 HISTORY — DX: Moderate protein-calorie malnutrition: E44.0

## 2019-03-23 HISTORY — PX: LEFT HEART CATH AND CORONARY ANGIOGRAPHY: CATH118249

## 2019-03-23 LAB — BASIC METABOLIC PANEL
Anion gap: 11 (ref 5–15)
BUN: 13 mg/dL (ref 8–23)
CO2: 22 mmol/L (ref 22–32)
Calcium: 8.8 mg/dL — ABNORMAL LOW (ref 8.9–10.3)
Chloride: 104 mmol/L (ref 98–111)
Creatinine, Ser: 0.87 mg/dL (ref 0.61–1.24)
GFR calc Af Amer: >60 mL/min (ref >60)
GFR calc non Af Amer: >60 mL/min (ref >60)
Glucose, Bld: 97 mg/dL (ref 70–99)
Potassium: 3.3 mmol/L — ABNORMAL LOW (ref 3.5–5.1)
Sodium: 137 mmol/L (ref 135–145)

## 2019-03-23 LAB — CBC
HCT: 38.4 % — ABNORMAL LOW (ref 39.0–52.0)
Hemoglobin: 13.1 g/dL (ref 13.0–17.0)
MCH: 31 pg (ref 26.0–34.0)
MCHC: 34.1 g/dL (ref 30.0–36.0)
MCV: 90.8 fL (ref 80.0–100.0)
Platelets: 148 10*3/uL — ABNORMAL LOW (ref 150–400)
RBC: 4.23 MIL/uL (ref 4.22–5.81)
RDW: 13 % (ref 11.5–15.5)
WBC: 6.5 10*3/uL (ref 4.0–10.5)
nRBC: 0 % (ref 0.0–0.2)

## 2019-03-23 LAB — HEPARIN LEVEL (UNFRACTIONATED): Heparin Unfractionated: 0.38 IU/mL (ref 0.30–0.70)

## 2019-03-23 LAB — MAGNESIUM: Magnesium: 1.8 mg/dL (ref 1.7–2.4)

## 2019-03-23 SURGERY — LEFT HEART CATH AND CORONARY ANGIOGRAPHY
Anesthesia: LOCAL

## 2019-03-23 MED ORDER — SODIUM CHLORIDE 0.9 % IV SOLN: 250.0000 mL | INTRAVENOUS | Status: DC | PRN

## 2019-03-23 MED ORDER — LIDOCAINE HCL (PF) 1 % IJ SOLN
INTRAMUSCULAR | Status: AC
  Filled 2019-03-23: qty 30

## 2019-03-23 MED ORDER — LIDOCAINE HCL (PF) 1 % IJ SOLN
INTRAMUSCULAR | Status: DC | PRN
  Administered 2019-03-23: 2 mL

## 2019-03-23 MED ORDER — VERAPAMIL HCL 2.5 MG/ML IV SOLN
INTRAVENOUS | Status: DC | PRN
  Administered 2019-03-23: 10 mL via INTRA_ARTERIAL

## 2019-03-23 MED ORDER — HEPARIN SODIUM (PORCINE) 1000 UNIT/ML IJ SOLN
INTRAMUSCULAR | Status: AC
  Filled 2019-03-23: qty 1

## 2019-03-23 MED ORDER — HEPARIN (PORCINE) IN NACL 1000-0.9 UT/500ML-% IV SOLN
INTRAVENOUS | Status: DC | PRN
  Administered 2019-03-23 (×2): 500 mL

## 2019-03-23 MED ORDER — VERAPAMIL HCL 2.5 MG/ML IV SOLN
INTRAVENOUS | Status: AC
  Filled 2019-03-23: qty 2

## 2019-03-23 MED ORDER — SODIUM CHLORIDE 0.9% FLUSH: 3.0000 mL | INTRAVENOUS | Status: DC | PRN

## 2019-03-23 MED ORDER — SODIUM CHLORIDE 0.9 % IV SOLN: INTRAVENOUS | Status: AC

## 2019-03-23 MED ORDER — ENSURE ENLIVE PO LIQD: 237.0000 mL | Freq: Two times a day (BID) | ORAL | Status: DC

## 2019-03-23 MED ORDER — LOSARTAN POTASSIUM 25 MG PO TABS
25.0000 mg | ORAL_TABLET | Freq: Every day | ORAL | Status: DC
  Administered 2019-03-23: 18:00:00 25 mg via ORAL
  Filled 2019-03-23: qty 1

## 2019-03-23 MED ORDER — SODIUM CHLORIDE 0.9 % WEIGHT BASED INFUSION
3.0000 mL/kg/h | INTRAVENOUS | Status: DC
  Administered 2019-03-23: 3 mL/kg/h via INTRAVENOUS

## 2019-03-23 MED ORDER — ASPIRIN 81 MG PO CHEW: 81.0000 mg | CHEWABLE_TABLET | ORAL | Status: AC

## 2019-03-23 MED ORDER — SODIUM CHLORIDE 0.9% FLUSH: 3.0000 mL | Freq: Two times a day (BID) | INTRAVENOUS | Status: DC

## 2019-03-23 MED ORDER — HEPARIN SODIUM (PORCINE) 1000 UNIT/ML IJ SOLN
INTRAMUSCULAR | Status: DC | PRN
  Administered 2019-03-23: 4000 [IU] via INTRAVENOUS

## 2019-03-23 MED ORDER — HYDRALAZINE HCL 20 MG/ML IJ SOLN: 10.0000 mg | INTRAMUSCULAR | Status: AC | PRN

## 2019-03-23 MED ORDER — LABETALOL HCL 5 MG/ML IV SOLN: 10.0000 mg | INTRAVENOUS | Status: AC | PRN

## 2019-03-23 MED ORDER — SODIUM CHLORIDE 0.9% FLUSH
3.0000 mL | Freq: Two times a day (BID) | INTRAVENOUS | Status: DC
  Administered 2019-03-23: 3 mL via INTRAVENOUS

## 2019-03-23 MED ORDER — IOHEXOL 350 MG/ML SOLN
INTRAVENOUS | Status: DC | PRN
  Administered 2019-03-23: 65 mL via INTRAVENOUS

## 2019-03-23 MED ORDER — HEPARIN (PORCINE) IN NACL 1000-0.9 UT/500ML-% IV SOLN
INTRAVENOUS | Status: AC
  Filled 2019-03-23: qty 1000

## 2019-03-23 MED ORDER — SODIUM CHLORIDE 0.9 % WEIGHT BASED INFUSION: 1.0000 mL/kg/h | INTRAVENOUS | Status: DC

## 2019-03-23 MED ORDER — POTASSIUM CHLORIDE CRYS ER 20 MEQ PO TBCR
40.0000 meq | EXTENDED_RELEASE_TABLET | Freq: Once | ORAL | Status: AC
  Administered 2019-03-23: 40 meq via ORAL
  Filled 2019-03-23: qty 2

## 2019-03-23 SURGICAL SUPPLY — 11 items
BAG SNAP BAND KOVER 36X36 (MISCELLANEOUS) ×2 IMPLANT
CATH 5FR JL3.5 JR4 ANG PIG MP (CATHETERS) ×2 IMPLANT
COVER DOME SNAP 22 D (MISCELLANEOUS) ×2 IMPLANT
DEVICE RAD COMP TR BAND LRG (VASCULAR PRODUCTS) ×2 IMPLANT
GLIDESHEATH SLEND SS 6F .021 (SHEATH) ×2 IMPLANT
GUIDEWIRE INQWIRE 1.5J.035X260 (WIRE) ×1 IMPLANT
INQWIRE 1.5J .035X260CM (WIRE) ×2
KIT HEART LEFT (KITS) ×2 IMPLANT
PACK CARDIAC CATHETERIZATION (CUSTOM PROCEDURE TRAY) ×2 IMPLANT
TRANSDUCER W/STOPCOCK (MISCELLANEOUS) ×2 IMPLANT
TUBING CIL FLEX 10 FLL-RA (TUBING) ×2 IMPLANT

## 2019-03-23 NOTE — Evaluation (Signed)
Physical Therapy Evaluation Patient Details Name: Aaron Schmidt MRN: [id number] DOB: 07/24/1933 Today's Date: 03/23/2019   History of Present Illness  85 yo male with onset of AMS, new NSTEMI, acute encephalopathy and observed celiac a aneurysm was given a heart cath today.  No invasive interventions were performed during the procedure, and chest pain was not noted by pt.  LVEF was 40-45% and has declined since last testing.  Mitral regurg, but no heart wall abnormalities found on cath. PMHx:  HOH, OSA, CPAP, L BBB, HTN, HLD,   Clinical Impression  Pt was seen for mobility and strength testing, and noted LLE weakness which pt relates to old changes.  He is able to walk but unsafe alone, and should have his wife to assist him.  Will see how he handles the challenge of stairs if able to complete his transition home, but if he is unable to complete this task may need to update his discharge plan.  Follow acutely to progress with all deficits of strength and endurance.     Follow Up Recommendations Home health PT;Supervision for mobility/OOB    Equipment Recommendations  None recommended by PT    Recommendations for Other Services       Precautions / Restrictions Precautions Precautions: Fall;Other (comment)(monitor vitals with therapy) Restrictions Weight Bearing Restrictions: No      Mobility  Bed Mobility Overal bed mobility: Needs Assistance Bed Mobility: Supine to Sit;Sit to Supine     Supine to sit: Mod assist Sit to supine: Min assist      Transfers Overall transfer level: Needs assistance Equipment used: Rolling walker (2 wheeled);1 person hand held assist Transfers: Sit to/from Stand Sit to Stand: Min assist         General transfer comment: reminders for hand placement and used elevated surface  Ambulation/Gait Ambulation/Gait assistance: Min assist Gait Distance (Feet): 60 Feet(30 x 2) Assistive device: Rolling walker (2 wheeled);1 person hand held  assist Gait Pattern/deviations: Step-to pattern;Step-through pattern;Shuffle;Drifts right/left;Trunk flexed Gait velocity: reduced Gait velocity interpretation: <1.31 ft/sec, indicative of household Conservation officer, historic buildings [place] (Stroke Patients Only)       Balance Overall balance assessment: Needs assistance Sitting-balance support: Feet supported Sitting balance-Leahy Scale: Fair     Standing balance support: Bilateral upper extremity supported;During functional activity Standing balance-Leahy Scale: Poor                               Pertinent Vitals/Pain Pain Assessment: No/denies pain    Home Living Family/patient expects to be discharged to:: Private residence Living Arrangements: Spouse/significant other Available Help at Discharge: Family;Available 24 hours/day Type of Home: House Home Access: Stairs to enter Entrance Stairs-Rails: Chemical engineer of Steps: 3 Home Layout: Two level Home Equipment: Walker - 2 wheels;Cane - single point Additional Comments: per pt he was up on walker vs cane with no assist prior to hosp    Prior Function Level of Independence: Independent with assistive device(s)         Comments: home with wife and able to care for himself     Hand Dominance   Dominant Hand: Right    Extremity/Trunk Assessment   Upper Extremity Assessment Upper Extremity Assessment: Generalized weakness    Lower Extremity Assessment Lower Extremity Assessment: Generalized weakness    Cervical / Trunk Assessment Cervical / Trunk  Assessment: Kyphotic  Communication   Communication: HOH  Cognition Arousal/Alertness: Lethargic Behavior During Therapy: Flat affect Overall Cognitive Status: No family/caregiver present to determine baseline cognitive functioning                                 General Comments: Pt was able to give some history but lacks  some details      General Comments General comments (skin integrity, edema, etc.): Pt was able to walk in his room with no complaints other than fatigue from his procedure today.  Updated nursing about his abilities but has a significant challenge with the stairs to go home    Exercises     Assessment/Plan    PT Assessment Patient needs continued PT services  PT Problem List Decreased strength;Decreased range of motion;Decreased activity tolerance;Decreased balance;Decreased mobility;Decreased coordination;Decreased safety awareness;Cardiopulmonary status limiting activity       PT Treatment Interventions DME instruction;Gait training;Stair training;Functional mobility training;Therapeutic activities;Therapeutic exercise;Neuromuscular re-education;Balance training;Patient/family education    PT Goals (Current goals can be found in the Care Plan section)  Acute Rehab PT Goals Patient Stated Goal: to get home with his wife PT Goal Formulation: With patient Time For Goal Achievement: 04/06/19 Potential to Achieve Goals: Good    Frequency Min 3X/week   Barriers to discharge Inaccessible home environment stairs to enter house and inside house    Co-evaluation               AM-PAC PT "6 Clicks" Mobility  Outcome Measure Help needed turning from your back to your side while in a flat bed without using bedrails?: A Little Help needed moving from lying on your back to sitting on the side of a flat bed without using bedrails?: A Little Help needed moving to and from a bed to a chair (including a wheelchair)?: A Little Help needed standing up from a chair using your arms (e.g., wheelchair or bedside chair)?: A Little Help needed to walk in hospital room?: A Little Help needed climbing 3-5 steps with a railing? : A Lot 6 Click Score: 17    End of Session Equipment Utilized During Treatment: Gait belt Activity Tolerance: Patient limited by fatigue;Treatment limited secondary to  medical complications (Comment) Patient left: in bed;with call bell/phone within reach;with bed alarm set Nurse Communication: Mobility status;Other (comment)(DC plans) PT Visit Diagnosis: Unsteadiness on feet (R26.81);Muscle weakness (generalized) (M62.81);Difficulty in walking, not elsewhere classified (R26.2)    Time: 0355-9741 PT Time Calculation (min) (ACUTE ONLY): 25 min   Charges:   PT Evaluation $PT Eval Moderate Complexity: 1 Mod PT Treatments $Gait Training: 8-22 mins       Ramond Dial 03/23/2019, 9:45 PM  Mee Hives, PT MS Acute Rehab Dept. Number: [place] [facility] [place]

## 2019-03-23 NOTE — Progress Notes (Signed)
Initial Nutrition Assessment  DOCUMENTATION CODES:   Non-severe (moderate) malnutrition in context of chronic illness  INTERVENTION:  Ensure Enlive po BID, each supplement provides 350 kcal and 20 grams of protein (likes chocolate) Magic cup TID with meals, each supplement provides 290 kcal and 9 grams of protein   NUTRITION DIAGNOSIS:   Moderate Malnutrition related to chronic illness as evidenced by energy intake < 75% for > or equal to 1 month, per patient/family report, moderate muscle depletion, moderate fat depletion.  GOAL:   Patient will meet greater than or equal to 90% of their needs   MONITOR:   PO intake, Labs, Supplement acceptance, Weight trends  REASON FOR ASSESSMENT:   Consult Assessment of nutrition requirement/status  ASSESSMENT:  85 year old male with medical history significant of HTN, OSA, left bundle block, pleural effusion and cervical myelopathy presented to ED with chest pain, admitted with non-ST MI.   Patient seen s/p cath lab today for left heart cath and coronary angiography. Patient awake and reports feeling good after returning from cath lab. Patient appears with some confusion at time of visit. He asked RD to shut the microwave door so it would stop beeping. RD explained the noise was coming from his IV and there was not a microwave in his room.   Patient recalls his favorite foods as hamburgers and hotdogs, but he states he eats what his wife fixes him. He reports 2 meals/day; usually breakfast and dinner with an occassional 1/2 sandwich for lunch. He reports finishing about half of his plate at meal times.  Patient recalls daily ONS and stated that [employer] brand was the best. Suspect patient PO is insufficient to meet estimated needs based on recall.     -Weights reviewed - stable Patient denies any recent changes in wt. He recalls intentional wt loss of  30lbs after lung surgery in 2018  NUTRITION - FOCUSED PHYSICAL EXAM:    Most Recent Value   Orbital Region  Moderate depletion  Upper Arm Region  Moderate depletion  Thoracic and Lumbar Region  Unable to assess  Buccal Region  Moderate depletion  Temple Region  Mild depletion  Clavicle Bone Region  Mild depletion  Clavicle and Acromion Bone Region  Unable to assess  Scapular Bone Region  Unable to assess  Dorsal Hand  Moderate depletion  Patellar Region  Moderate depletion  Anterior Thigh Region  Moderate depletion  Posterior Calf Region  Moderate depletion  Edema (RD Assessment)  Mild  Hair  Reviewed  Eyes  Reviewed  Mouth  Reviewed  Skin  Reviewed  Nails  Reviewed       Diet Order:  50% of 1 recorded meal Diet Order            Diet Heart Room service appropriate? Yes; Fluid consistency: Thin  Diet effective now              EDUCATION NEEDS:   Not appropriate for education at this time  Skin:  Skin Assessment: Reviewed RN Assessment  Last BM:  6/28  Height:   Ht Readings from Last 1 Encounters:  03/21/19 5' 10.5" (1.791 m)    Weight:   Wt Readings from Last 1 Encounters:  03/21/19 77.6 kg    Ideal Body Weight:  75.5 kg  BMI:  Body mass index is 24.19 kg/m.  Estimated Nutritional Needs:   Kcal:  1800-2000  Protein:  93-110g  Fluid:  </=2L    Lajuan Lines, RD, LDN  After Hours/Weekend  Pager: [phone]

## 2019-03-23 NOTE — Progress Notes (Signed)
I have sent a message to our office's scheduling team requesting a follow-up appointment, and our office will call the patient with this information.  Dayna Dunn PA-C  

## 2019-03-23 NOTE — Interval H&P Note (Signed)
History and Physical Interval Note:  03/23/2019 10:48 AM  Aaron Schmidt  has presented today for cardiac cath with the diagnosis of nstemi.  The various methods of treatment have been discussed with the patient and family. After consideration of risks, benefits and other options for treatment, the patient has consented to  Procedure(s): LEFT HEART CATH AND CORONARY ANGIOGRAPHY (N/A) as a surgical intervention.  The patient's history has been reviewed, patient examined, no change in status, stable for surgery.  I have reviewed the patient's chart and labs.  Questions were answered to the patient's satisfaction.    Cath Lab Visit (complete for each Cath Lab visit)  Clinical Evaluation Leading to the Procedure:   ACS: Yes.    Non-ACS:    Anginal Classification: CCS III  Anti-ischemic medical therapy: No Therapy  Non-Invasive Test Results: No non-invasive testing performed  Prior CABG: No previous CABG       [name]

## 2019-03-23 NOTE — Progress Notes (Signed)
Lab tech Kayla related to me that Mr Uno refused to have uric acid lab drawn. He is scheduled for routine labs 6/30  5am will have it drawn then.

## 2019-03-23 NOTE — Progress Notes (Signed)
PROGRESS NOTE  ARNE SCHLENDER LKG:[id number] DOB: [birth year] DOA: 03/21/2019 PCP: Hulan Fess, MD   LOS: 2 days   Patient is from: Home  Brief Narrative / Interim history: 85 year old male with history of HTN, OSA not on CPAP, HLD, LBBB, BPH, cervical and lumbar radiculopathy status post fusion and chronic pain on opiates presenting with chest pain, lower back pain, mental status change and lower extremity weakness and found to have non-STEMI.  In ED, hemodynamically stable.  CBC not impressive.  Creatinine 1.8 (baseline 1.3-1.5).  BUN 19.  Troponin 2263.  EKG LLLB without Sgarbossa.  CXR and CT head without acute finding.  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  Otherwise no significant finding.  Cardiology consulted.  Patient admitted for NSTEMI and started on heparin drip.   Subjective: No major events overnight of this morning.  He says he could not sleep due to his chronic back pain and the telemetry wires.  Denies chest pain, dyspnea.  Reports foot pain.  Endorses right ankle swelling which is chronic for him.  Denies GI or GU symptoms.  History is limited by patient's mental status.  He is oriented to self and partially only.  Objective: Vitals:   03/22/19 2045 03/23/19 0555 03/23/19 0949 03/23/19 1057  BP:  (!) 167/86 (!) 169/83   Pulse:  74 71   Resp:  14    Temp: 98.8 F (37.1 C) 98.6 F (37 C)    TempSrc: Oral Oral    SpO2:  96%  99%  Weight:      Height:        Intake/Output Summary (Last 24 hours) at 03/23/2019 1458 Last data filed at 03/23/2019 0555 Gross per 24 hour  Intake 382.84 ml  Output 800 ml  Net -417.16 ml   Filed Weights   03/21/19 1200  Weight: 77.6 kg    Examination:  GENERAL: No acute distress.  Appears well.  HEENT: MMM.  Vision and hearing grossly intact.  NECK: Supple.  No apparent DVT. LUNGS:  No IWOB. Good air movement bilaterally. HEART:  RRR.  2/6 SEM over LUSB.  ABD: Bowel sounds present. Soft. Non tender.  MSK/EXT:  Moves all extremities.  Slight hyperextension at both ankles.  Slight swelling around right ankle.  No increased warmth to touch.  Tenderness across lower back. SKIN: no apparent skin lesion or wound NEURO: Awake, alert and oriented to self and partial place.  No gross deficit.  PSYCH: Calm. Normal affect.  I have personally reviewed the following labs and images: CBC: Recent Labs  Lab 03/21/19 1059 03/21/19 1112 03/22/19 1200 03/23/19 0536  WBC 7.7  --  7.3 6.5  NEUTROABS 6.7  --   --   --   HGB 14.0 14.6 13.0 13.1  HCT 43.6 43.0 39.1 38.4*  MCV 95.0  --  93.3 90.8  PLT 144*  --  144* 664*   Basic Metabolic Panel: Recent Labs  Lab 03/21/19 1059 03/21/19 1112 03/22/19 1200 03/23/19 0536  NA 140 139 138 137  K 4.0 4.0 4.1 3.3*  CL 103 103 104 104  CO2 25  --  24 22  GLUCOSE 122* 119* 103* 97  BUN 17 19 14 13   CREATININE 0.96 0.80 0.90 0.87  CALCIUM 9.2  --  8.9 8.8*  MG  --   --   --  1.8   GFR: Estimated Creatinine Clearance: 66.3 mL/min (by C-G formula based on SCr of 0.87  mg/dL). Liver Function Tests: Recent Labs  Lab 03/21/19 1059  AST 63*  ALT 25  ALKPHOS 76  BILITOT 1.1  PROT 6.1*  ALBUMIN 3.8   No results for input(s): LIPASE, AMYLASE in the last 168 hours. No results for input(s): AMMONIA in the last 168 hours. Coagulation Profile: No results for input(s): INR, PROTIME in the last 168 hours. Cardiac Enzymes: No results for input(s): CKTOTAL, CKMB, CKMBINDEX, TROPONINI in the last 168 hours. BNP (last 3 results) No results for input(s): PROBNP in the last 8760 hours. HbA1C: No results for input(s): HGBA1C in the last 72 hours. CBG: Recent Labs  Lab 03/21/19 1051  GLUCAP 95   Lipid Profile: Recent Labs    03/22/19 1200  CHOL 139  HDL 66  LDLCALC 59  TRIG 72  CHOLHDL 2.1   Thyroid Function Tests: No results for input(s): TSH, T4TOTAL, FREET4, T3FREE, THYROIDAB in the last 72  hours. Anemia Panel: No results for input(s): VITAMINB12, FOLATE, FERRITIN, TIBC, IRON, RETICCTPCT in the last 72 hours. Urine analysis:    Component Value Date/Time   COLORURINE YELLOW 03/21/2019 1112   APPEARANCEUR CLEAR 03/21/2019 1112   LABSPEC 1.017 03/21/2019 1112   PHURINE 6.0 03/21/2019 1112   GLUCOSEU NEGATIVE 03/21/2019 1112   HGBUR NEGATIVE 03/21/2019 1112   BILIRUBINUR NEGATIVE 03/21/2019 1112   KETONESUR 80 (A) 03/21/2019 1112   PROTEINUR NEGATIVE 03/21/2019 1112   UROBILINOGEN 0.2 10/20/2010 1625   NITRITE NEGATIVE 03/21/2019 1112   LEUKOCYTESUR NEGATIVE 03/21/2019 1112   Sepsis Labs: Invalid input(s): PROCALCITONIN, LACTICIDVEN  Recent Results (from the past 240 hour(s))  SARS Coronavirus 2 (CEPHEID - Performed in [facility] hospital lab), Hosp Order     Status: None   Collection Time: 03/21/19  1:02 PM   Specimen: Nasopharyngeal Swab  Result Value Ref Range Status   SARS Coronavirus 2 NEGATIVE NEGATIVE Final    Comment: (NOTE) If result is NEGATIVE SARS-CoV-2 target nucleic acids are NOT DETECTED. The SARS-CoV-2 RNA is generally detectable in upper and lower  respiratory specimens during the acute phase of infection. The lowest  concentration of SARS-CoV-2 viral copies this assay can detect is 250  copies / mL. A negative result does not preclude SARS-CoV-2 infection  and should not be used as the sole basis for treatment or other  patient management decisions.  A negative result may occur with  improper specimen collection / handling, submission of specimen other  than nasopharyngeal swab, presence of viral mutation(s) within the  areas targeted by this assay, and inadequate number of viral copies  (<250 copies / mL). A negative result must be combined with clinical  observations, patient history, and epidemiological information. If result is POSITIVE SARS-CoV-2 target nucleic acids are DETECTED. The SARS-CoV-2 RNA is generally detectable in upper and  lower  respiratory specimens dur ing the acute phase of infection.  Positive  results are indicative of active infection with SARS-CoV-2.  Clinical  correlation with patient history and other diagnostic information is  necessary to determine patient infection status.  Positive results do  not rule out bacterial infection or co-infection with other viruses. If result is PRESUMPTIVE POSTIVE SARS-CoV-2 nucleic acids MAY BE PRESENT.   A presumptive positive result was obtained on the submitted specimen  and confirmed on repeat testing.  While 2019 novel coronavirus  (SARS-CoV-2) nucleic acids may be present in the submitted sample  additional confirmatory testing may be necessary for epidemiological  and / or clinical management purposes  to differentiate between  SARS-CoV-2 and other Sarbecovirus currently known to infect humans.  If clinically indicated additional testing with an alternate test  methodology [phone]) is advised. The SARS-CoV-2 RNA is generally  detectable in upper and lower respiratory sp ecimens during the acute  phase of infection. The expected result is Negative. Fact Sheet for Patients:  StrictlyIdeas.no Fact Sheet for Healthcare Providers: BankingDealers.co.za This test is not yet approved or cleared by the [place] FDA and has been authorized for detection and/or diagnosis of SARS-CoV-2 by FDA under an Emergency Use Authorization (EUA).  This EUA will remain in effect (meaning this test can be used) for the duration of the COVID-19 declaration under Section 564(b)(1) of the Act, 21 U.S.C. section 360bbb-3(b)(1), unless the authorization is terminated or revoked sooner. Performed at [facility], [place] [address] [place], [place] [address]      Radiology Studies: No results found.   Procedures:  2/28-echo with EF of 45 to 50%, moderate concentric LVH, DD, RVSP 43 mm chordae and mild to moderate MVR   Microbiology: COVID-19 negative.  Assessment & Plan: Non-STEMI: Presenting with chest pain.  Elevated troponin now downtrending (2263> 2126> 1650).  EKG with chronic LBBB without Sgarbossa.  Currently chest pain-free but unreliable historian. -Cardiology managing  -Continue heparin drip  -Start aspirin 81 mg daily  -Continue atorvastatin  -Continue nitro drip and PRN morphine  -Continue carvedilol 6.25 mg twice daily  -LHC tentatively on 6/29  Acute on chronic encephalopathy: Likely toxic due to multiple sedating medication for chronic pain.  Also difficulty hearing.  Wife concerned about losing his hearing aid if she brings to the hospital.  Oriented to self and partial place this morning.  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  Very slow progression. -Continue surveillance every 1 to 2 years.  OSA: does not use CPAP at home. -Offer CPAP  BPH: Stable -Continue home Proscar  Hypertension: SBP elevated to 160s.  Could be due to IV fluid. -It appears cardiology hydrating him for catheterization.  Chronic lower back pain/lower extremity weakness/pain: No new MSK finding on CT abdomen and pelvis.  Neuro exam reassuring.  No new GI or GU symptoms.  On p.o. Dilaudid, Lyrica and Zanaflex at home. -Scheduled Tylenol -Discontinue Dilaudid while on PRN morphine for non-STEMI -Scheduled Tylenol -Voltaren gel -Continue Lyrica at low-dose -Check uric acid level -PT/OT  Constipation: likely due to opiate.  -Schedule daily Senokot -PRN MiraLAX  Poor p.o. intake: -Consulted dietitian. -IV fluid as above  DVT prophylaxis: On heparin drip Code Status: Full code Family Communication: Updated patient's wife over the phone on 6/28 Disposition Plan: Remains inpatient for non-STEMI. Consultants: Cardiology  Antimicrobials: Anti-infectives (From admission,  onward)   None      Scheduled Meds: . acetaminophen  650 mg Oral Q8H  . aspirin  81 mg Oral Daily  . atorvastatin  40 mg Oral q1800  . carvedilol  6.25 mg Oral BID WC  . diclofenac sodium  2 g Topical QID  . pregabalin  50 mg Oral BID  . senna  1 tablet Oral BID  . sodium chloride flush  3 mL Intravenous Q12H   Continuous Infusions: . sodium chloride 75 mL/hr at 03/23/19 1157  . sodium chloride     PRN Meds:.sodium chloride, hydrALAZINE, labetalol, morphine injection, nitroGLYCERIN, ondansetron (ZOFRAN) IV, sodium chloride flush    T. Shepherd  If 7PM-7AM, please  contact night-coverage [email or web address] Password [facility] 03/23/2019, 2:58 PM

## 2019-03-23 NOTE — H&P (View-Only) (Signed)
Progress Note  Patient Name: Aaron Schmidt Date of Encounter: 03/23/2019  Primary Cardiologist: Ena Dawley, MD   Subjective   No recurrent chest pain, improved confusion.  Inpatient Medications    Scheduled Meds: . acetaminophen  650 mg Oral Q8H  . aspirin  81 mg Oral Daily  . atorvastatin  40 mg Oral q1800  . carvedilol  6.25 mg Oral BID WC  . diclofenac sodium  2 g Topical QID  . potassium chloride  40 mEq Oral Once  . pregabalin  50 mg Oral BID  . senna  1 tablet Oral BID   Continuous Infusions: . sodium chloride 75 mL/hr at 03/23/19 0358  . heparin 1,350 Units/hr (03/23/19 0004)  . nitroGLYCERIN 5 mcg/min (03/22/19 2200)   PRN Meds: morphine injection, nitroGLYCERIN, ondansetron (ZOFRAN) IV   Vital Signs    Vitals:   03/22/19 0820 03/22/19 1452 03/22/19 2045 03/23/19 0555  BP: 118/85 (!) 146/73  (!) 167/86  Pulse: 76 76  74  Resp: 18 13  14   Temp:  99.2 F (37.3 C) 98.8 F (37.1 C) 98.6 F (37 C)  TempSrc:  Oral Oral Oral  SpO2: 100% 97%  96%  Weight:      Height:        Intake/Output Summary (Last 24 hours) at 03/23/2019 0917 Last data filed at 03/23/2019 0555 Gross per 24 hour  Intake 382.84 ml  Output 1500 ml  Net -1117.16 ml   Filed Weights   03/21/19 1200  Weight: 77.6 kg    Telemetry    Sinus rhythm- Personally Reviewed   Physical Exam   GEN: No acute distress.   Neck: No JVD Cardiac: RRR, no murmurs, rubs, or gallops.  Respiratory: Clear to auscultation bilaterally. GI: Soft, nontender, non-distended  MS: No edema; No deformity. Neuro:  Nonfocal  Psych: Normal affect   Labs    Chemistry Recent Labs  Lab 03/21/19 1059 03/21/19 1112 03/22/19 1200 03/23/19 0536  NA 140 139 138 137  K 4.0 4.0 4.1 3.3*  CL 103 103 104 104  CO2 25  --  24 22  GLUCOSE 122* 119* 103* 97  BUN 17 19 14 13   CREATININE 0.96 0.80 0.90 0.87  CALCIUM 9.2  --  8.9 8.8*  PROT 6.1*  --   --   --   ALBUMIN 3.8  --   --   --   AST 63*  --    --   --   ALT 25  --   --   --   ALKPHOS 76  --   --   --   BILITOT 1.1  --   --   --   GFRNONAA >60  --  >60 >60  GFRAA >60  --  >60 >60  ANIONGAP 12  --  10 11     Hematology Recent Labs  Lab 03/21/19 1059 03/21/19 1112 03/22/19 1200 03/23/19 0536  WBC 7.7  --  7.3 6.5  RBC 4.59  --  4.19* 4.23  HGB 14.0 14.6 13.0 13.1  HCT 43.6 43.0 39.1 38.4*  MCV 95.0  --  93.3 90.8  MCH 30.5  --  31.0 31.0  MCHC 32.1  --  33.2 34.1  RDW 13.2  --  13.3 13.0  PLT 144*  --  144* 148*   Cardiac EnzymesNo results for input(s): TROPONINI in the last 168 hours. No results for input(s): TROPIPOC in the last 168 hours.   BNPNo results for input(s):  BNP, PROBNP in the last 168 hours.   DDimer No results for input(s): DDIMER in the last 168 hours.   Radiology    Ct Head Wo Contrast  Result Date: 03/21/2019 CLINICAL DATA:  Altered mental status. Altered level of consciousness, unexplained. EXAM: CT HEAD WITHOUT CONTRAST TECHNIQUE: Contiguous axial images were obtained from the base of the skull through the vertex without intravenous contrast. COMPARISON:  CT scan dated 05/22/2018 FINDINGS: Brain: There is no acute infarction, hemorrhage, or mass lesion. There is diffuse cerebral cortical atrophy. Several tiny areas of periventricular white matter lucency consistent with chronic small vessel ischemic disease, stable. Vascular: No hyperdense vessel or unexpected calcification. Skull: Normal. Negative for fracture or focal lesion. Sinuses/Orbits: No significant abnormality. Small retention cyst in the base of the left maxillary sinus. Other: None. IMPRESSION: No acute intracranial abnormality. Diffuse mild atrophy, stable. Minimal small vessel ischemic changes, stable. Electronically Signed   By: Lorriane Shire M.D.   On: 03/21/2019 12:40   Dg Chest Portable 1 View  Result Date: 03/21/2019 CLINICAL DATA:  Chest pain starting 2 days ago, some shortness of breath. EXAM: PORTABLE CHEST 1 VIEW COMPARISON:   Chest x-rays dated 10/08/2017 and 10/02/2017. FINDINGS: Heart size and mediastinal contours are stable. Chronic scarring/atelectasis at the LEFT lung base. No new lung findings. No pneumothorax seen. Osseous structures about the chest are unremarkable. IMPRESSION: No active disease. No evidence of pneumonia or pulmonary edema. Electronically Signed   By: Franki Cabot M.D.   On: 03/21/2019 11:28   Ct Angio Chest/abd/pel For Dissection W And/or Wo Contrast  Result Date: 03/21/2019 CLINICAL DATA:  Chest pain, lower back pain, aortic dissection suspected EXAM: CT ANGIOGRAPHY CHEST, ABDOMEN AND PELVIS TECHNIQUE: Multidetector CT imaging through the chest, abdomen and pelvis was performed using the standard protocol during bolus administration of intravenous contrast. Multiplanar reconstructed images and MIPs were obtained and reviewed to evaluate the vascular anatomy. CONTRAST:  12mL OMNIPAQUE IOHEXOL 350 MG/ML SOLN COMPARISON:  CT chest angiogram, 08/13/2017 FINDINGS: CTA CHEST FINDINGS Cardiovascular: Preferential opacification of the thoracic aorta. No evidence of thoracic aortic aneurysm or dissection. Mild aortic atherosclerosis. Cardiomegaly. No pericardial effusion. Mediastinum/Nodes: No enlarged mediastinal, hilar, or axillary lymph nodes. Thyroid gland, trachea, and esophagus demonstrate no significant findings. Lungs/Pleura: Small left, trace right pleural effusions with associated atelectasis or consolidation. No pleural effusion or pneumothorax. Musculoskeletal: No chest wall abnormality. No acute or significant osseous findings. Review of the MIP images confirms the above findings. CTA ABDOMEN AND PELVIS FINDINGS VASCULAR Normal contour and caliber of the abdominal aorta. No evidence of aneurysm or dissection. Mild atherosclerosis. 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 Standard branching pattern of the aorta. Review of the MIP images  confirms the above findings. NON-VASCULAR Hepatobiliary: No solid liver abnormality is seen. The gallbladder is distended. No gallbladder wall thickening, or biliary dilatation. Pancreas: Unremarkable. No pancreatic ductal dilatation or surrounding inflammatory changes. Spleen: Normal in size without significant abnormality. Adrenals/Urinary Tract: Small, benign fat containing nodule of the right adrenal gland. Kidneys are normal, without renal calculi, solid lesion, or hydronephrosis. Bladder is unremarkable. Stomach/Bowel: Stomach is within normal limits. Appendix appears normal. No evidence of bowel wall thickening, distention, or inflammatory changes. Pancolonic diverticulosis. There is a very redundant sigmoid colon, with a moderate burden of stool throughout. Vascular/Lymphatic: No significant vascular findings are present. No enlarged abdominal or pelvic lymph nodes. Reproductive: No mass or other significant abnormality. Other: No  abdominal wall hernia or abnormality. No abdominopelvic ascites. Musculoskeletal: No acute or significant osseous findings. Posterior fusion and discectomy of L3-L4. Ankylosis of the spine. Review of the MIP images confirms the above findings. IMPRESSION: 1. Normal contour and caliber of the aorta without evidence of aneurysm, dissection, or other acute aortic syndrome. Mild atherosclerosis. 2. 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 This appears slightly enlarged in comparison to the vessel as incidentally included on prior CT chest angiogram dated 08/13/2017, at which time it measured 1.1 cm when measured similarly. 3. The gallbladder is distended. No obstructing calculus or biliary ductal dilatation is noted. Consider dedicated gallbladder ultrasound to further evaluate if gallbladder pathology is suspected. 4. Other chronic, incidental, and postoperative findings of the chest, abdomen, and pelvis as detailed above. Electronically Signed   By:  Eddie Candle M.D.   On: 03/21/2019 12:47    Cardiac Studies   Echo for this admission pending  Echocardiogram 2018: Study Conclusions  - Left ventricle: The cavity size was normal. Wall thickness was increased in a pattern of mild LVH. Systolic function was normal. The estimated ejection fraction was in the range of 60% to 65%. Wall motion was normal; there were no regional wall motion abnormalities. Doppler parameters are consistent with abnormal left ventricular relaxation (grade 1 diastolic dysfunction). - Mitral valve: There was mild regurgitation.  Patient Profile     86 y.o. male with a PMH of HTN, HLD, chronic LBBB, OSA on CPAP, and HOH who is being seen today for the evaluation of chest pain at the request of Dr. Regenia Skeeter.  Assessment & Plan    1. NSTEMI: patient presented with chest pain of unclear duration. He is confused and unable to provide a compelling history. Trop was elevated to [phone]. EKG with chronic LBBB.   - Continue heparin gtt - I will start daily ASA 81 mg - I will check CBC/BMET - Continue nitro gtt as chest pain appears to be under control - started carvedilol 6.25 mg po BID on 6/27 -continue iv hydration till cath, then D/C -Per wife they would like to proceed with full work-up and therapy as he has been fully functional until a few days ago, if necessary day would agree with cardiac catheterization.  We will tentatively plan for Monday, patients confusion is resolved, he agrees with the procedure - LVEF 40-45% from 60-65% in 2018 with new regional wall motion abnormalities - lipids are at goal  2. HTN:  - Continue nitro gtt for management of #1 - carvedilol added, we will add losartan 25 mg po daily after the cath  3. HLD:  - AST mildly elevated - started atorvastatin 40mg  daily on 6/27, lipids are at goal  For questions or updates, please contact [place] Please consult [email or web address] for contact info under  Cardiology/STEMI.     Signed, Ena Dawley, MD  03/23/2019, 9:17 AM

## 2019-03-23 NOTE — Progress Notes (Addendum)
Progress Note  Patient Name: Aaron Schmidt Date of Encounter: 03/23/2019  Primary Cardiologist: Ena Dawley, MD   Subjective   No recurrent chest pain, improved confusion.  Inpatient Medications    Scheduled Meds: . acetaminophen  650 mg Oral Q8H  . aspirin  81 mg Oral Daily  . atorvastatin  40 mg Oral q1800  . carvedilol  6.25 mg Oral BID WC  . diclofenac sodium  2 g Topical QID  . potassium chloride  40 mEq Oral Once  . pregabalin  50 mg Oral BID  . senna  1 tablet Oral BID   Continuous Infusions: . sodium chloride 75 mL/hr at 03/23/19 0358  . heparin 1,350 Units/hr (03/23/19 0004)  . nitroGLYCERIN 5 mcg/min (03/22/19 2200)   PRN Meds: morphine injection, nitroGLYCERIN, ondansetron (ZOFRAN) IV   Vital Signs    Vitals:   03/22/19 0820 03/22/19 1452 03/22/19 2045 03/23/19 0555  BP: 118/85 (!) 146/73  (!) 167/86  Pulse: 76 76  74  Resp: 18 13  14   Temp:  99.2 F (37.3 C) 98.8 F (37.1 C) 98.6 F (37 C)  TempSrc:  Oral Oral Oral  SpO2: 100% 97%  96%  Weight:      Height:        Intake/Output Summary (Last 24 hours) at 03/23/2019 0917 Last data filed at 03/23/2019 0555 Gross per 24 hour  Intake 382.84 ml  Output 1500 ml  Net -1117.16 ml   Filed Weights   03/21/19 1200  Weight: 77.6 kg    Telemetry    Sinus rhythm- Personally Reviewed   Physical Exam   GEN: No acute distress.   Neck: No JVD Cardiac: RRR, no murmurs, rubs, or gallops.  Respiratory: Clear to auscultation bilaterally. GI: Soft, nontender, non-distended  MS: No edema; No deformity. Neuro:  Nonfocal  Psych: Normal affect   Labs    Chemistry Recent Labs  Lab 03/21/19 1059 03/21/19 1112 03/22/19 1200 03/23/19 0536  NA 140 139 138 137  K 4.0 4.0 4.1 3.3*  CL 103 103 104 104  CO2 25  --  24 22  GLUCOSE 122* 119* 103* 97  BUN 17 19 14 13   CREATININE 0.96 0.80 0.90 0.87  CALCIUM 9.2  --  8.9 8.8*  PROT 6.1*  --   --   --   ALBUMIN 3.8  --   --   --   AST 63*  --    --   --   ALT 25  --   --   --   ALKPHOS 76  --   --   --   BILITOT 1.1  --   --   --   GFRNONAA >60  --  >60 >60  GFRAA >60  --  >60 >60  ANIONGAP 12  --  10 11     Hematology Recent Labs  Lab 03/21/19 1059 03/21/19 1112 03/22/19 1200 03/23/19 0536  WBC 7.7  --  7.3 6.5  RBC 4.59  --  4.19* 4.23  HGB 14.0 14.6 13.0 13.1  HCT 43.6 43.0 39.1 38.4*  MCV 95.0  --  93.3 90.8  MCH 30.5  --  31.0 31.0  MCHC 32.1  --  33.2 34.1  RDW 13.2  --  13.3 13.0  PLT 144*  --  144* 148*   Cardiac EnzymesNo results for input(s): TROPONINI in the last 168 hours. No results for input(s): TROPIPOC in the last 168 hours.   BNPNo results for input(s):  BNP, PROBNP in the last 168 hours.   DDimer No results for input(s): DDIMER in the last 168 hours.   Radiology    Ct Head Wo Contrast  Result Date: 03/21/2019 CLINICAL DATA:  Altered mental status. Altered level of consciousness, unexplained. EXAM: CT HEAD WITHOUT CONTRAST TECHNIQUE: Contiguous axial images were obtained from the base of the skull through the vertex without intravenous contrast. COMPARISON:  CT scan dated 05/22/2018 FINDINGS: Brain: There is no acute infarction, hemorrhage, or mass lesion. There is diffuse cerebral cortical atrophy. Several tiny areas of periventricular white matter lucency consistent with chronic small vessel ischemic disease, stable. Vascular: No hyperdense vessel or unexpected calcification. Skull: Normal. Negative for fracture or focal lesion. Sinuses/Orbits: No significant abnormality. Small retention cyst in the base of the left maxillary sinus. Other: None. IMPRESSION: No acute intracranial abnormality. Diffuse mild atrophy, stable. Minimal small vessel ischemic changes, stable. Electronically Signed   By: Lorriane Shire M.D.   On: 03/21/2019 12:40   Dg Chest Portable 1 View  Result Date: 03/21/2019 CLINICAL DATA:  Chest pain starting 2 days ago, some shortness of breath. EXAM: PORTABLE CHEST 1 VIEW COMPARISON:   Chest x-rays dated 10/08/2017 and 10/02/2017. FINDINGS: Heart size and mediastinal contours are stable. Chronic scarring/atelectasis at the LEFT lung base. No new lung findings. No pneumothorax seen. Osseous structures about the chest are unremarkable. IMPRESSION: No active disease. No evidence of pneumonia or pulmonary edema. Electronically Signed   By: Franki Cabot M.D.   On: 03/21/2019 11:28   Ct Angio Chest/abd/pel For Dissection W And/or Wo Contrast  Result Date: 03/21/2019 CLINICAL DATA:  Chest pain, lower back pain, aortic dissection suspected EXAM: CT ANGIOGRAPHY CHEST, ABDOMEN AND PELVIS TECHNIQUE: Multidetector CT imaging through the chest, abdomen and pelvis was performed using the standard protocol during bolus administration of intravenous contrast. Multiplanar reconstructed images and MIPs were obtained and reviewed to evaluate the vascular anatomy. CONTRAST:  184mL OMNIPAQUE IOHEXOL 350 MG/ML SOLN COMPARISON:  CT chest angiogram, 08/13/2017 FINDINGS: CTA CHEST FINDINGS Cardiovascular: Preferential opacification of the thoracic aorta. No evidence of thoracic aortic aneurysm or dissection. Mild aortic atherosclerosis. Cardiomegaly. No pericardial effusion. Mediastinum/Nodes: No enlarged mediastinal, hilar, or axillary lymph nodes. Thyroid gland, trachea, and esophagus demonstrate no significant findings. Lungs/Pleura: Small left, trace right pleural effusions with associated atelectasis or consolidation. No pleural effusion or pneumothorax. Musculoskeletal: No chest wall abnormality. No acute or significant osseous findings. Review of the MIP images confirms the above findings. CTA ABDOMEN AND PELVIS FINDINGS VASCULAR Normal contour and caliber of the abdominal aorta. No evidence of aneurysm or dissection. Mild atherosclerosis. 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 Standard branching pattern of the aorta. Review of the MIP images  confirms the above findings. NON-VASCULAR Hepatobiliary: No solid liver abnormality is seen. The gallbladder is distended. No gallbladder wall thickening, or biliary dilatation. Pancreas: Unremarkable. No pancreatic ductal dilatation or surrounding inflammatory changes. Spleen: Normal in size without significant abnormality. Adrenals/Urinary Tract: Small, benign fat containing nodule of the right adrenal gland. Kidneys are normal, without renal calculi, solid lesion, or hydronephrosis. Bladder is unremarkable. Stomach/Bowel: Stomach is within normal limits. Appendix appears normal. No evidence of bowel wall thickening, distention, or inflammatory changes. Pancolonic diverticulosis. There is a very redundant sigmoid colon, with a moderate burden of stool throughout. Vascular/Lymphatic: No significant vascular findings are present. No enlarged abdominal or pelvic lymph nodes. Reproductive: No mass or other significant abnormality. Other: No  abdominal wall hernia or abnormality. No abdominopelvic ascites. Musculoskeletal: No acute or significant osseous findings. Posterior fusion and discectomy of L3-L4. Ankylosis of the spine. Review of the MIP images confirms the above findings. IMPRESSION: 1. Normal contour and caliber of the aorta without evidence of aneurysm, dissection, or other acute aortic syndrome. Mild atherosclerosis. 2. 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 This appears slightly enlarged in comparison to the vessel as incidentally included on prior CT chest angiogram dated 08/13/2017, at which time it measured 1.1 cm when measured similarly. 3. The gallbladder is distended. No obstructing calculus or biliary ductal dilatation is noted. Consider dedicated gallbladder ultrasound to further evaluate if gallbladder pathology is suspected. 4. Other chronic, incidental, and postoperative findings of the chest, abdomen, and pelvis as detailed above. Electronically Signed   By:  Eddie Candle M.D.   On: 03/21/2019 12:47    Cardiac Studies   Echo for this admission pending  Echocardiogram 2018: Study Conclusions  - Left ventricle: The cavity size was normal. Wall thickness was increased in a pattern of mild LVH. Systolic function was normal. The estimated ejection fraction was in the range of 60% to 65%. Wall motion was normal; there were no regional wall motion abnormalities. Doppler parameters are consistent with abnormal left ventricular relaxation (grade 1 diastolic dysfunction). - Mitral valve: There was mild regurgitation.  Patient Profile     85 y.o. male with a PMH of HTN, HLD, chronic LBBB, OSA on CPAP, and HOH who is being seen today for the evaluation of chest pain at the request of Dr. Regenia Skeeter.  Assessment & Plan    1. NSTEMI: patient presented with chest pain of unclear duration. He is confused and unable to provide a compelling history. Trop was elevated to [phone]. EKG with chronic LBBB.   - Continue heparin gtt - I will start daily ASA 81 mg - I will check CBC/BMET - Continue nitro gtt as chest pain appears to be under control - started carvedilol 6.25 mg po BID on 6/27 -continue iv hydration till cath, then D/C -Per wife they would like to proceed with full work-up and therapy as he has been fully functional until a few days ago, if necessary day would agree with cardiac catheterization.  We will tentatively plan for Monday, patients confusion is resolved, he agrees with the procedure - LVEF 40-45% from 60-65% in 2018 with new regional wall motion abnormalities - lipids are at goal  2. HTN:  - Continue nitro gtt for management of #1 - carvedilol added, we will add losartan 25 mg po daily after the cath  3. HLD:  - AST mildly elevated - started atorvastatin 40mg  daily on 6/27, lipids are at goal  For questions or updates, please contact [place] Please consult [email or web address] for contact info under  Cardiology/STEMI.     Signed, Ena Dawley, MD  03/23/2019, 9:17 AM    Addendum:  The patient has normal coronaries on cardiac cath today, this potentially represents case of mild myocarditis, with new LV dysfunction I would add losartan 25 mg p.o. daily.  The patient can be discharged home, we will arrange for outpatient follow-up.  Ena Dawley

## 2019-03-24 DIAGNOSIS — E440 Moderate protein-calorie malnutrition: Secondary | ICD-10-CM

## 2019-03-24 LAB — BASIC METABOLIC PANEL
Anion gap: 8 (ref 5–15)
BUN: 11 mg/dL (ref 8–23)
CO2: 24 mmol/L (ref 22–32)
Calcium: 8.9 mg/dL (ref 8.9–10.3)
Chloride: 109 mmol/L (ref 98–111)
Creatinine, Ser: 0.91 mg/dL (ref 0.61–1.24)
GFR calc Af Amer: >60 mL/min (ref >60)
GFR calc non Af Amer: >60 mL/min (ref >60)
Glucose, Bld: 86 mg/dL (ref 70–99)
Potassium: 3.6 mmol/L (ref 3.5–5.1)
Sodium: 141 mmol/L (ref 135–145)

## 2019-03-24 LAB — URIC ACID: Uric Acid, Serum: 6.6 mg/dL (ref 3.7–8.6)

## 2019-03-24 LAB — MAGNESIUM: Magnesium: 1.7 mg/dL (ref 1.7–2.4)

## 2019-03-24 MED ORDER — DICLOFENAC SODIUM 1 % TD GEL: 2.0000 g | Freq: Four times a day (QID) | TRANSDERMAL | 0 refills | Status: AC

## 2019-03-24 MED ORDER — LOSARTAN POTASSIUM 50 MG PO TABS: 50.0000 mg | ORAL_TABLET | Freq: Every day | ORAL | 0 refills | Status: DC

## 2019-03-24 MED ORDER — SENNA 8.6 MG PO TABS: 1.0000 | ORAL_TABLET | Freq: Two times a day (BID) | ORAL | 0 refills | Status: DC

## 2019-03-24 MED ORDER — LOSARTAN POTASSIUM 50 MG PO TABS
50.0000 mg | ORAL_TABLET | Freq: Every day | ORAL | Status: DC
  Administered 2019-03-24: 50 mg via ORAL
  Filled 2019-03-24: qty 1

## 2019-03-24 MED ORDER — ENSURE ENLIVE PO LIQD: 237.0000 mL | Freq: Two times a day (BID) | ORAL | 0 refills | Status: AC

## 2019-03-24 MED ORDER — CARVEDILOL 6.25 MG PO TABS: 6.2500 mg | ORAL_TABLET | Freq: Two times a day (BID) | ORAL | 1 refills | Status: DC

## 2019-03-24 MED FILL — CARVEDILOL 6.25 MG TABLET: 6.25 | 90 days supply | Qty: 180 | Fill #0

## 2019-03-24 MED FILL — DICLOFENAC SODIUM 1% GEL: 1 | 25 days supply | Qty: 200 | Fill #0

## 2019-03-24 MED FILL — SENNA 8.6 MG TABS: 8.6 | 60 days supply | Qty: 120 | Fill #0

## 2019-03-24 MED FILL — LOSARTAN POTASSIUM 50 MG TA: 50 | 90 days supply | Qty: 90 | Fill #0

## 2019-03-24 NOTE — Plan of Care (Signed)

## 2019-03-24 NOTE — TOC Transition Note (Signed)
Transition of Care [facility]) - CM/SW Discharge Note   Patient Details  Name: Aaron Schmidt MRN: [id number] Date of Birth: 08-Feb-1933  [facility]) CM/SW Contact:  Bethena Roys, RN Phone Number: 03/24/2019, 12:09 PM   Clinical Narrative:  Pt presented for NStemi- PTA from home with spouse. Plan to return home with [place]. Patient agreeable to [facility] Services. Referral made to [place] to begin within 24-48 hours. Pt has PCP and gets medications without any problems. Wife to provide transportation home. No further needs from CM at this time.     Final next level of care: Home w Home Health Services Barriers to Discharge: No Barriers Identified   Patient Goals and CMS Choice     Choice offered to / list presented to : Spouse(via phone)  Discharge Placement                       Discharge Plan and Services In-house Referral: NA Discharge Planning Services: CM Consult Post Acute Care Choice: Home Health                    HH Arranged: PT, OT [facility] Agency: [place] ([facility]) Date [facility] Agency Contacted: 03/24/19 Time [place]: [place] Representative spoke with at [place]: [place] ([place]) Interventions     Readmission Risk Interventions No flowsheet data found.

## 2019-03-24 NOTE — Progress Notes (Signed)
Physical Therapy Treatment Patient Details Name: Aaron Schmidt MRN: [id number] DOB: 06-28-1933 Today's Date: 03/24/2019    History of Present Illness 85 yo male with onset of AMS, new NSTEMI, acute encephalopathy . PMHx:  HOH, OSA, CPAP, L BBB, HTN, HLD,    PT Comments    Pt in chair on arrival and agreeable to gait and potentially attempting stairs. However, in hall when getting near stairs pt reported he did not have the endurance to complete them and that he could stay down stairs in his recliner initially. Pt with improved gait tolerance but continues to rely on RW and is limited by chronic back pain.  HR 78-84 with activity    Follow Up Recommendations  Home health PT;Supervision for mobility/OOB     Equipment Recommendations  None recommended by PT    Recommendations for Other Services       Precautions / Restrictions Precautions Precautions: Fall Restrictions Weight Bearing Restrictions: No    Mobility  Bed Mobility Overal bed mobility: Needs Assistance Bed Mobility: Supine to Sit     Supine to sit: Min guard     General bed mobility comments: in chair on arrival  Transfers Overall transfer level: Needs assistance Equipment used: Rolling walker (2 wheeled) Transfers: Sit to/from Stand Sit to Stand: Supervision         General transfer comment: pt able to stand to and from recliner with supervision for safety  Ambulation/Gait Ambulation/Gait assistance: Min guard Gait Distance (Feet): 350 Feet Assistive device: Rolling walker (2 wheeled) Gait Pattern/deviations: Step-through pattern;Decreased stride length;Trunk flexed   Gait velocity interpretation: >2.62 ft/sec, indicative of community ambulatory General Gait Details: cues for posture, position in RW, and looking up. PT declined attempting stairs due to fatigue   Stairs Stairs: (pt declined attempting)           Wheelchair Mobility    Modified Rankin (Stroke Patients Only)        Balance Overall balance assessment: Needs assistance Sitting-balance support: Feet supported Sitting balance-Leahy Scale: Good     Standing balance support: Bilateral upper extremity supported;During functional activity Standing balance-Leahy Scale: Poor                              Cognition Arousal/Alertness: Awake/alert Behavior During Therapy: WFL for tasks assessed/performed Overall Cognitive Status: No family/caregiver present to determine baseline cognitive functioning                                 General Comments: Pt was able to give some history but lacks some details      Exercises General Exercises - Lower Extremity Long Arc Quad: AROM;20 reps;Both;Seated Hip ABduction/ADduction: AROM;20 reps;Both;Seated Hip Flexion/Marching: AROM;20 reps;Both;Seated    General Comments        Pertinent Vitals/Pain Pain Assessment: Faces Faces Pain Scale: Hurts whole lot Pain Location: chronic low back pain with decreased pain after gait Pain Descriptors / Indicators: Aching;Constant Pain Intervention(s): Limited activity within patient's tolerance;Repositioned;Monitored during session    Home Living Family/patient expects to be discharged to:: Private residence Living Arrangements: Spouse/significant other Available Help at Discharge: Family;Available 24 hours/day Type of Home: House Home Access: Stairs to enter Entrance Stairs-Rails: Left;Right Home Layout: Two level Home Equipment: Environmental consultant - 2 wheels;Cane - single point Additional Comments: per pt he was up on walker vs cane with no assist prior to hosp  Prior Function Level of Independence: Independent with assistive device(s)      Comments: home with wife and able to care for himself   PT Goals (current goals can now be found in the care plan section) Acute Rehab PT Goals Patient Stated Goal: to get home with his wife Progress towards PT goals: Progressing toward goals     Frequency    Min 3X/week      PT Plan Current plan remains appropriate    Co-evaluation              AM-PAC PT "6 Clicks" Mobility   Outcome Measure  Help needed turning from your back to your side while in a flat bed without using bedrails?: A Little Help needed moving from lying on your back to sitting on the side of a flat bed without using bedrails?: A Little Help needed moving to and from a bed to a chair (including a wheelchair)?: A Little Help needed standing up from a chair using your arms (e.g., wheelchair or bedside chair)?: A Little Help needed to walk in hospital room?: A Little Help needed climbing 3-5 steps with a railing? : A Lot 6 Click Score: 17    End of Session Equipment Utilized During Treatment: Gait belt Activity Tolerance: Patient tolerated treatment well Patient left: in chair;with call bell/phone within reach;with chair alarm set Nurse Communication: Mobility status PT Visit Diagnosis: Muscle weakness (generalized) (M62.81);Difficulty in walking, not elsewhere classified (R26.2);Other abnormalities of gait and mobility (R26.89)     Time: 9767-3419 PT Time Calculation (min) (ACUTE ONLY): 21 min  Charges:  $Gait Training: 8-22 mins                     Dana, PT Acute Rehabilitation Services Pager: [phone] Office: [place] 03/24/2019, 1:06 PM

## 2019-03-24 NOTE — Discharge Summary (Signed)
Physician Discharge Summary  Aaron Schmidt CBS:[id number] DOB: 08/02/1933 DOA: 03/21/2019  PCP: Hulan Fess, MD  Admit date: 03/21/2019 Discharge date: 03/24/2019  Admitted From: Home Disposition: Home  Recommendations for Outpatient Follow-up:  1. Follow up with PCP in 1-2 weeks 2. Please obtain CBC/BMP/Mag at follow up 3. Please follow up on the following pending results: None  Home Health: PT/OT Equipment/Devices: None recommended  Discharge Condition: Stable CODE STATUS: Full code  Hospital Course: 85 year old male with history of HTN, OSA not on CPAP, HLD, LBBB, BPH, cervical and lumbar radiculopathy status post fusion and chronic pain on opiates presenting with chest pain, lower back pain, mental status change and lower extremity weakness and found to have non-STEMI.  In ED, hemodynamically stable.  CBC not impressive.  Creatinine 1.8 (baseline 1.3-1.5).  BUN 19.  Troponin 2263. EKG LLLB without Sgarbossa.  CXR and CT head without acute finding.  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  Otherwise no significant finding.  Cardiology consulted.  Patient admitted for NSTEMI and started on heparin drip.   Patient had echocardiogram in 03/14/2019 that revealed EF of 45 to 50%, moderate concentric LVH, DD, RVSP to 42.9 mmHg and mild to moderate MVR (see detailed read below).  He underwent left heart catheterization on 03/23/2019 which did not reveal obstructive CAD.  Patient remained free of cardiopulmonary symptoms during his stay.  He was hydrated with IV fluids and discharged home with home health PT/OT.   See individual problem list below for more.  Discharge Diagnoses:  Non-STEMI: Presenting with chest pain.  Elevated troponin now downtrending (2263> 2126> 1650).  EKG with chronic LBBB without Sgarbossa.  Patient remained free of cardiopulmonary symptoms during this hospitalization.  Echocardiogram  electrocauterization as above.  Cleared by cardiology for discharge. -Discharged on Coreg 6.25 mg twice daily and losartan 50 mg daily mainly for blood pressure. -Continue home statin and aspirin. -Follow-up with cardiology in 1 to 2 weeks -Check renal function and electrolytes at follow-up  Acute on chronic encephalopathy: Likely toxic due to multiple sedating medication for chronic pain and dehydration from poor PEG.  Also difficulty hearing.  This has resolved. -Recommend reviewing his home pain medication -Recommended proper hydration.  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  Very slow progression. -Continue surveillance every 1 to 2 years.  OSA: does not use CPAP at home.  BPH: Stable -Continue home Proscar  Hypertension: SBP in 160s to 170s.  Could be driven by pain. -Discharged on 40.5 mg twice daily and losartan 50 mg daily -Reassess BP at follow-up and adjust BP medication as appropriate -Recheck renal function and electrolytes at follow-up.  Chronic lower back pain/lower extremity weakness/pain: No new MSK finding on CT abdomen and pelvis.  Neuro exam reassuring.  No new GI or GU symptoms.  On p.o. Dilaudid, Lyrica and Zanaflex at home. -Continue with home medication but encouraged him and his wife to discuss this with PCP of pain clinic. -Recommended taking Tylenol around-the-clock -Gave him prescription for Voltaren gel. -HHPT/OT  Constipation: likely due to opiate.  Resolved. -Discussed the importance of bowel regimen while on opiates  Moderate malnutrition/dehydration: Likely due to poor p.o. intake -Discharged on Ensure and multivitamin per dietitian recommendation -Encourage good hydration.  Discharge Instructions  Discharge Instructions    Call MD for:  difficulty breathing, headache or visual disturbances   Complete by: As directed    Call MD for:  extreme fatigue  Complete by: As directed    Call MD for:  persistant  dizziness or light-headedness   Complete by: As directed    Call MD for:  severe uncontrolled pain   Complete by: As directed    Call MD for:  temperature >100.4   Complete by: As directed    Diet - low sodium heart healthy   Complete by: As directed    Discharge instructions   Complete by: As directed    It has been a pleasure taking care of you! You were hospitalized for chest pain and back pain.  After the tests we have done, it is unlikely that your chest pain is related to your heart.  Your back pain is likely due to osteoarthritis.  You can continue your home pain medication with cautions as most of them can impair your judgment and increase your risk of fall.  We have also given a prescription for Voltaren gel.  We have added additional medication for high blood pressure.  Please review your medication list on the direction before you take them.  Please call you primary care doctor office and the cardiologist office to schedule a hospital follow-up in 1 to 2 weeks.  Take care, Dr. Cyndia Skeeters   Increase activity slowly   Complete by: As directed      Allergies as of 03/24/2019      Reactions   Diazepam Other (See Comments)   "makes goofy"      Medication List    TAKE these medications   Biotin 10000 MCG Tabs Take 1 tablet by mouth daily.   carvedilol 6.25 MG tablet Commonly known as: COREG Take 1 tablet (6.25 mg total) by mouth 2 (two) times daily with a meal.   diclofenac sodium 1 % Gel Commonly known as: VOLTAREN Apply 2 g topically 4 (four) times daily for 25 days.   feeding supplement (ENSURE ENLIVE) Liqd Take 237 mLs by mouth 2 (two) times daily between meals.   finasteride 5 MG tablet Commonly known as: PROSCAR Take 5 mg by mouth daily.   Flax Seed Oil 1000 MG Caps Take 1,000 mg by mouth daily.   folic acid 528 MCG tablet Commonly known as: FOLVITE Take 400 mcg by mouth daily.   HYDROmorphone 2 MG tablet Commonly known as: DILAUDID Take 2 mg by mouth 4  (four) times daily as needed for severe pain.   Iron 325 (65 Fe) MG Tabs Take 1 tablet by mouth every other day.   L-Lysine 500 MG Caps Take 500 mg by mouth daily.   losartan 50 MG tablet Commonly known as: COZAAR Take 1 tablet (50 mg total) by mouth daily.   mineral oil liquid Take 15 mLs by mouth at bedtime.   mineral oil liquid Take 15 mLs by mouth daily as needed for moderate constipation.   multivitamin with minerals Tabs tablet Take 1 tablet by mouth daily. Mature mens 50+   NIACIN CR PO Take 25 mg by mouth daily.   nortriptyline 25 MG capsule Commonly known as: PAMELOR Take 25 mg by mouth at bedtime.   OVER THE COUNTER MEDICATION Take 6 tablets by mouth daily. Perdiem 15 mg   PANTOTHENIC ACID PO Take 5.5 mg by mouth daily.   PERDIEM PO Take 7 tablets by mouth daily.   polyethylene glycol 17 g packet Commonly known as: MIRALAX / GLYCOLAX Take 17 g by mouth daily.   pregabalin 150 MG capsule Commonly known as: LYRICA Take 150 mg by mouth 2 (  two) times daily.   pyridOXINE 100 MG tablet Commonly known as: VITAMIN B-6 Take 100 mg by mouth daily.   saw palmetto 80 MG capsule Take 240 mg by mouth daily.   senna 8.6 MG Tabs tablet Commonly known as: SENOKOT Take 1 tablet (8.6 mg total) by mouth 2 (two) times a day.   simvastatin 40 MG tablet Commonly known as: ZOCOR Take 40 mg by mouth at bedtime.   SODIUM CHLORIDE PO Take by mouth daily. 10 mg   tiZANidine 2 MG tablet Commonly known as: ZANAFLEX Take 2 mg by mouth 3 (three) times daily.   vitamin B-12 100 MCG tablet Commonly known as: CYANOCOBALAMIN Take 100 mcg by mouth daily.   vitamin C 500 MG tablet Commonly known as: ASCORBIC ACID Take 500 mg by mouth daily.   Vitamin D 50 MCG (2000 UT) Caps Take 2,000 Units by mouth daily.      Follow-up Information    Little, Lennette Bihari, MD. Schedule an appointment as soon as possible for a visit in 1 week(s).   Specialty: Family Medicine Contact  information: [place] [address] [phone]        [facility] Follow up.   Specialty: Cardiology Why: [facility] (cardiology office) - the office will call you to set up your follow-up appointment. Thank you! Contact information: [address] [place] [facility] Follow up.   Specialty: Home Health Services Why: Physical Theapry/ Occupational Therapy- office to call you with a visit time. If you need to contact office please call [phone]          Consultations:  Cardiology  Procedures/Studies:  2D Echo on 03/22/2019 1. The left ventricle has mildly reduced systolic function, with an ejection fraction of 45-50%. The cavity size was normal. There is moderate concentric left ventricular hypertrophy. Left ventricular diastolic Doppler parameters are consistent with  impaired relaxation. Elevated left ventricular end-diastolic pressure.  2. The right ventricle has normal systolic function. The cavity was normal. There is no increase in right ventricular wall thickness. Right ventricular systolic pressure is moderately elevated with an estimated pressure of 42.9 mmHg.  3. Trivial pericardial effusion is present.  4. The mitral valve is degenerative. Moderate thickening of the mitral valve leaflet. Mild calcification of the mitral valve leaflet. Mitral valve regurgitation is mild to moderate by color flow Doppler.  5. The aortic valve is tricuspid. Moderate thickening of the aortic valve. Mild calcification of the aortic valve. Aortic valve regurgitation was not assessed by color flow Doppler. Mild stenosis of the aortic valve.  6. The inferior vena cava was dilated in size with >50% respiratory variability.  Left heart catheterization on 03/23/2019 -No CAD  Ct Head Wo Contrast  Result Date: 03/21/2019 CLINICAL DATA:  Altered mental status.  Altered level of consciousness, unexplained. EXAM: CT HEAD WITHOUT CONTRAST TECHNIQUE: Contiguous axial images were obtained from the base of the skull through the vertex without intravenous contrast. COMPARISON:  CT scan dated 05/22/2018 FINDINGS: Brain: There is no acute infarction, hemorrhage, or mass lesion. There is diffuse cerebral cortical atrophy. Several tiny areas of periventricular white matter lucency consistent with chronic small vessel ischemic disease, stable. Vascular: No hyperdense vessel or unexpected calcification. Skull: Normal. Negative for fracture or focal lesion. Sinuses/Orbits: No significant abnormality. Small retention cyst in the base of the left maxillary sinus. Other: None. IMPRESSION: No acute intracranial abnormality.  Diffuse mild atrophy, stable. Minimal small vessel ischemic changes, stable. Electronically Signed   By: Lorriane Shire M.D.   On: 03/21/2019 12:40   US Renal  Result Date: 02/25/2019 CLINICAL DATA:  85 year old male with stage 3 chronic kidney disease. EXAM: RENAL / URINARY TRACT ULTRASOUND COMPLETE COMPARISON:  Renal ultrasound 07/02/2018 and earlier. FINDINGS: Right Kidney: Renal measurements: 10.4 x 5.3 x 5.8 centimeters = volume: 167 mL (stable). Chronic renal cortical thinning. No hydronephrosis or right renal mass. Left Kidney: Renal measurements: 9.8 x 5.1 x 4.5 centimeters = volume: 117 mL (stable). Chronic cortical thinning. No left hydronephrosis or solid renal mass. Small 14 mm midpole cyst is stable and appears benign. Bladder: Appears normal for degree of bladder distention. IMPRESSION: No acute findings. Stable ultrasound appearance of both kidneys since 2019. Electronically Signed   By: Genevie Ann M.D.   On: 02/25/2019 16:50   Dg Chest Portable 1 View  Result Date: 03/21/2019 CLINICAL DATA:  Chest pain starting 2 days ago, some shortness of breath. EXAM: PORTABLE CHEST 1 VIEW COMPARISON:  Chest x-rays dated 10/08/2017 and 10/02/2017. FINDINGS: Heart  size and mediastinal contours are stable. Chronic scarring/atelectasis at the LEFT lung base. No new lung findings. No pneumothorax seen. Osseous structures about the chest are unremarkable. IMPRESSION: No active disease. No evidence of pneumonia or pulmonary edema. Electronically Signed   By: Franki Cabot M.D.   On: 03/21/2019 11:28   Ct Angio Chest/abd/pel For Dissection W And/or Wo Contrast  Result Date: 03/21/2019 CLINICAL DATA:  Chest pain, lower back pain, aortic dissection suspected EXAM: CT ANGIOGRAPHY CHEST, ABDOMEN AND PELVIS TECHNIQUE: Multidetector CT imaging through the chest, abdomen and pelvis was performed using the standard protocol during bolus administration of intravenous contrast. Multiplanar reconstructed images and MIPs were obtained and reviewed to evaluate the vascular anatomy. CONTRAST:  195mL OMNIPAQUE IOHEXOL 350 MG/ML SOLN COMPARISON:  CT chest angiogram, 08/13/2017 FINDINGS: CTA CHEST FINDINGS Cardiovascular: Preferential opacification of the thoracic aorta. No evidence of thoracic aortic aneurysm or dissection. Mild aortic atherosclerosis. Cardiomegaly. No pericardial effusion. Mediastinum/Nodes: No enlarged mediastinal, hilar, or axillary lymph nodes. Thyroid gland, trachea, and esophagus demonstrate no significant findings. Lungs/Pleura: Small left, trace right pleural effusions with associated atelectasis or consolidation. No pleural effusion or pneumothorax. Musculoskeletal: No chest wall abnormality. No acute or significant osseous findings. Review of the MIP images confirms the above findings. CTA ABDOMEN AND PELVIS FINDINGS VASCULAR Normal contour and caliber of the abdominal aorta. No evidence of aneurysm or dissection. Mild atherosclerosis. 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 Standard branching pattern of the aorta. Review of the MIP images confirms the above findings. NON-VASCULAR Hepatobiliary: No  solid liver abnormality is seen. The gallbladder is distended. No gallbladder wall thickening, or biliary dilatation. Pancreas: Unremarkable. No pancreatic ductal dilatation or surrounding inflammatory changes. Spleen: Normal in size without significant abnormality. Adrenals/Urinary Tract: Small, benign fat containing nodule of the right adrenal gland. Kidneys are normal, without renal calculi, solid lesion, or hydronephrosis. Bladder is unremarkable. Stomach/Bowel: Stomach is within normal limits. Appendix appears normal. No evidence of bowel wall thickening, distention, or inflammatory changes. Pancolonic diverticulosis. There is a very redundant sigmoid colon, with a moderate burden of stool throughout. Vascular/Lymphatic: No significant vascular findings are present. No enlarged abdominal or pelvic lymph nodes. Reproductive: No mass or other significant abnormality. Other: No abdominal wall hernia or abnormality. No abdominopelvic ascites. Musculoskeletal: No acute or significant osseous findings. Posterior  fusion and discectomy of L3-L4. Ankylosis of the spine. Review of the MIP images confirms the above findings. IMPRESSION: 1. Normal contour and caliber of the aorta without evidence of aneurysm, dissection, or other acute aortic syndrome. Mild atherosclerosis. 2. 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 This appears slightly enlarged in comparison to the vessel as incidentally included on prior CT chest angiogram dated 08/13/2017, at which time it measured 1.1 cm when measured similarly. 3. The gallbladder is distended. No obstructing calculus or biliary ductal dilatation is noted. Consider dedicated gallbladder ultrasound to further evaluate if gallbladder pathology is suspected. 4. Other chronic, incidental, and postoperative findings of the chest, abdomen, and pelvis as detailed above. Electronically Signed   By: Eddie Candle M.D.   On: 03/21/2019 12:47      Subjective: No major events overnight of this morning.  Continues to endorse low back pain which is chronic for him.  Denies chest pain, dyspnea, dizziness palpitation.  Denies GI or GU symptoms.  Sitting on bedside chair eating breakfast this morning.  Feels ready to go home.   Discharge Exam: Vitals:   03/23/19 1815 03/24/19 0544  BP: (!) 167/71 (!) 170/80  Pulse: 70 65  Resp:  19  Temp:  (!) 97.4 F (36.3 C)  SpO2:  99%    GENERAL: No acute distress.  Appears well.  HEENT: MMM.  Vision and hearing grossly intact.  NECK: Supple.  No JVD.  LUNGS:  No IWOB. Good air movement bilaterally. HEART:  RRR. Heart sounds normal.  ABD: Bowel sounds present. Soft. Non tender.  MSK/EXT:  Moves all extremities. No apparent deformity. No edema bilaterally. SKIN: no apparent skin lesion or wound NEURO: Awake, alert and oriented appropriately.  No gross deficit.  PSYCH: Calm. Normal affect.   The results of significant diagnostics from this hospitalization (including imaging, microbiology, ancillary and laboratory) are listed below for reference.     Microbiology: Recent Results (from the past 240 hour(s))  SARS Coronavirus 2 (CEPHEID - Performed in [place] hospital lab), Hosp Order     Status: None   Collection Time: 03/21/19  1:02 PM   Specimen: Nasopharyngeal Swab  Result Value Ref Range Status   SARS Coronavirus 2 NEGATIVE NEGATIVE Final    Comment: (NOTE) If result is NEGATIVE SARS-CoV-2 target nucleic acids are NOT DETECTED. The SARS-CoV-2 RNA is generally detectable in upper and lower  respiratory specimens during the acute phase of infection. The lowest  concentration of SARS-CoV-2 viral copies this assay can detect is 250  copies / mL. A negative result does not preclude SARS-CoV-2 infection  and should not be used as the sole basis for treatment or other  patient management decisions.  A negative result may occur with  improper specimen collection / handling,  submission of specimen other  than nasopharyngeal swab, presence of viral mutation(s) within the  areas targeted by this assay, and inadequate number of viral copies  (<250 copies / mL). A negative result must be combined with clinical  observations, patient history, and epidemiological information. If result is POSITIVE SARS-CoV-2 target nucleic acids are DETECTED. The SARS-CoV-2 RNA is generally detectable in upper and lower  respiratory specimens dur ing the acute phase of infection.  Positive  results are indicative of active infection with SARS-CoV-2.  Clinical  correlation with patient history and other diagnostic information is  necessary to determine patient infection status.  Positive results do  not rule out bacterial infection or  co-infection with other viruses. If result is PRESUMPTIVE POSTIVE SARS-CoV-2 nucleic acids MAY BE PRESENT.   A presumptive positive result was obtained on the submitted specimen  and confirmed on repeat testing.  While 2019 novel coronavirus  (SARS-CoV-2) nucleic acids may be present in the submitted sample  additional confirmatory testing may be necessary for epidemiological  and / or clinical management purposes  to differentiate between  SARS-CoV-2 and other Sarbecovirus currently known to infect humans.  If clinically indicated additional testing with an alternate test  methodology [phone]) is advised. The SARS-CoV-2 RNA is generally  detectable in upper and lower respiratory sp ecimens during the acute  phase of infection. The expected result is Negative. Fact Sheet for Patients:  StrictlyIdeas.no Fact Sheet for Healthcare Providers: BankingDealers.co.za This test is not yet approved or cleared by the [place] FDA and has been authorized for detection and/or diagnosis of SARS-CoV-2 by FDA under an Emergency Use Authorization (EUA).  This EUA will remain in effect (meaning this test can be  used) for the duration of the COVID-19 declaration under Section 564(b)(1) of the Act, 21 U.S.C. section 360bbb-3(b)(1), unless the authorization is terminated or revoked sooner. Performed at [facility], [place] [address], [place], [place] [address]      Labs: BNP (last 3 results) No results for input(s): BNP in the last 8760 hours. Basic Metabolic Panel: Recent Labs  Lab 03/21/19 1059 03/21/19 1112 03/22/19 1200 03/23/19 0536 03/24/19 0358  NA 140 139 138 137 141  K 4.0 4.0 4.1 3.3* 3.6  CL 103 103 104 104 109  CO2 25  --  24 22 24   GLUCOSE 122* 119* 103* 97 86  BUN 17 19 14 13 11   CREATININE 0.96 0.80 0.90 0.87 0.91  CALCIUM 9.2  --  8.9 8.8* 8.9  MG  --   --   --  1.8 1.7   Liver Function Tests: Recent Labs  Lab 03/21/19 1059  AST 63*  ALT 25  ALKPHOS 76  BILITOT 1.1  PROT 6.1*  ALBUMIN 3.8   No results for input(s): LIPASE, AMYLASE in the last 168 hours. No results for input(s): AMMONIA in the last 168 hours. CBC: Recent Labs  Lab 03/21/19 1059 03/21/19 1112 03/22/19 1200 03/23/19 0536  WBC 7.7  --  7.3 6.5  NEUTROABS 6.7  --   --   --   HGB 14.0 14.6 13.0 13.1  HCT 43.6 43.0 39.1 38.4*  MCV 95.0  --  93.3 90.8  PLT 144*  --  144* 148*   Cardiac Enzymes: No results for input(s): CKTOTAL, CKMB, CKMBINDEX, TROPONINI in the last 168 hours. BNP: Invalid input(s): POCBNP CBG: Recent Labs  Lab 03/21/19 1051  GLUCAP 95   D-Dimer No results for input(s): DDIMER in the last 72 hours. Hgb A1c No results for input(s): HGBA1C in the last 72 hours. Lipid Profile Recent Labs    03/22/19 1200  CHOL 139  HDL 66  LDLCALC 59  TRIG 72  CHOLHDL 2.1   Thyroid function studies No results for input(s): TSH, T4TOTAL, T3FREE, THYROIDAB in the last 72 hours.  Invalid input(s): FREET3 Anemia work up No results for input(s): VITAMINB12, FOLATE, FERRITIN, TIBC, IRON, RETICCTPCT in the last 72 hours. Urinalysis    Component Value Date/Time    COLORURINE YELLOW 03/21/2019 1112   APPEARANCEUR CLEAR 03/21/2019 1112   LABSPEC 1.017 03/21/2019 1112   PHURINE 6.0 03/21/2019 1112   GLUCOSEU NEGATIVE 03/21/2019 1112   HGBUR NEGATIVE  03/21/2019 1112   [place] 03/21/2019 1112   KETONESUR 80 (A) 03/21/2019 1112   PROTEINUR NEGATIVE 03/21/2019 1112   UROBILINOGEN 0.2 10/20/2010 1625   NITRITE NEGATIVE 03/21/2019 1112   LEUKOCYTESUR NEGATIVE 03/21/2019 1112   Sepsis Labs Invalid input(s): PROCALCITONIN,  WBC,  LACTICIDVEN   Time coordinating discharge: 35 minutes  SIGNED:  Mercy Riding, MD  [facility] 03/24/2019, 4:36 PM  If 7PM-7AM, please contact night-coverage [email or web address] Password [email or web address]

## 2019-03-24 NOTE — Progress Notes (Signed)
[facility] will sign off.    The patient has normal coronaries on cardiac cath yesterday this potentially represents case of mild myocarditis, with new LV dysfunction I would add losartan 25 mg p.o. daily.  The patient can be discharged home, he is scheduled to see Estella Husk on 04/07/2019.  Medication Recommendations:  As above Other recommendations (labs, testing, etc):  No further testing Follow up as an outpatient:  As above  Ena Dawley, MD

## 2019-03-24 NOTE — Care Management Important Message (Signed)
Important Message  Patient Details  Name: Aaron Schmidt MRN: [id number] Date of Birth: 09-23-1933   Medicare Important Message Given:  Yes     Shelda Altes 03/24/2019, 12:43 PM

## 2019-03-24 NOTE — Evaluation (Signed)
Occupational Therapy Evaluation Patient Details Name: Aaron Schmidt MRN: [id number] DOB: August 26, 1933 Today's Date: 03/24/2019    History of Present Illness 85 yo male with onset of AMS, new NSTEMI, acute encephalopathy and observed celiac a aneurysm was given a heart cath today.  No invasive interventions were performed during the procedure, and chest pain was not noted by pt.  LVEF was 40-45% and has declined since last testing.  Mitral regurg, but no heart wall abnormalities found on cath. PMHx:  HOH, OSA, CPAP, L BBB, HTN, HLD,    Clinical Impression   Pt admitted with the above diagnoses and presents with below problem list. Pt will benefit from continued acute OT to address the below listed deficits and maximize independence with basic ADLs prior to d/c home. PTA pt was mod I with ADLs. Pt is currently min guard with LB ADLs and functional transfers/mobility. VSS throughout session.      Follow Up Recommendations  Home health OT;Supervision - Intermittent    Equipment Recommendations  None recommended by OT    Recommendations for Other Services       Precautions / Restrictions Precautions Precautions: Fall Restrictions Weight Bearing Restrictions: No      Mobility Bed Mobility Overal bed mobility: Needs Assistance Bed Mobility: Supine to Sit     Supine to sit: Min guard     General bed mobility comments: from [facility] flat. Min guard for safety  Transfers Overall transfer level: Needs assistance Equipment used: Rolling walker (2 wheeled) Transfers: Sit to/from Stand Sit to Stand: Min guard         General transfer comment: from EOB to recliner. min guard for safety. Rw (vs HHA) utilized per pt preference.    Balance   Sitting-balance support: Feet supported Sitting balance-Leahy Scale: Fair     Standing balance support: Bilateral upper extremity supported;During functional activity Standing balance-Leahy Scale: Poor                              ADL either performed or assessed with clinical judgement   ADL Overall ADL's : Needs assistance/impaired Eating/Feeding: Set up;Sitting   Grooming: Min guard;Wash/dry face;Wash/dry hands;Standing Grooming Details (indicate cue type and reason): external support in standing (leaning on rw vs single extremity support). Upper Body Bathing: Set up;Sitting   Lower Body Bathing: Min guard;Sit to/from stand   Upper Body Dressing : Set up;Sitting   Lower Body Dressing: Min guard;Sit to/from stand   Toilet Transfer: Min guard;Ambulation;RW   Toileting- Water quality scientist and Hygiene: Min guard;Set up;Sitting/lateral lean;Sit to/from stand   Tub/ Shower Transfer: Walk-in shower;Min guard;Ambulation;Rolling walker;Shower seat   Functional mobility during ADLs: Banker      Pertinent Vitals/Pain Pain Assessment: Faces Faces Pain Scale: Hurts little more Pain Location: chronic low back pain Pain Descriptors / Indicators: Aching Pain Intervention(s): Monitored during session;Limited activity within patient's tolerance;Repositioned     Hand Dominance Right   Extremity/Trunk Assessment Upper Extremity Assessment Upper Extremity Assessment: Generalized weakness   Lower Extremity Assessment Lower Extremity Assessment: Defer to PT evaluation   Cervical / Trunk Assessment Cervical / Trunk Assessment: Kyphotic   Communication Communication Communication: HOH   Cognition Arousal/Alertness: Awake/alert Behavior During Therapy: Flat affect;WFL for tasks assessed/performed Overall Cognitive Status: No family/caregiver present to determine baseline cognitive functioning  General Comments: Pt was able to give some history but lacks some details   General Comments       Exercises     Shoulder Instructions      Home Living Family/patient expects to be discharged  to:: Private residence Living Arrangements: Spouse/significant other Available Help at Discharge: Family;Available 24 hours/day Type of Home: House Home Access: Stairs to enter [employer] of Steps: 3 Entrance Stairs-Rails: Left;Right Home Layout: Two level Alternate Level Stairs-Number of Steps: 14   Bathroom Shower/Tub: Occupational psychologist: Standard     Home Equipment: Environmental consultant - 2 wheels;Cane - single point   Additional Comments: per pt he was up on walker vs cane with no assist prior to hosp      Prior Functioning/Environment Level of Independence: Independent with assistive device(s)        Comments: home with wife and able to care for himself        OT Problem List: Decreased strength;Decreased activity tolerance;Impaired balance (sitting and/or standing);Decreased knowledge of use of DME or AE;Decreased knowledge of precautions;Pain      OT Treatment/Interventions: Self-care/ADL training;Therapeutic exercise;DME and/or AE instruction;Therapeutic activities;Patient/family education;Balance training    OT Goals(Current goals can be found in the care plan section) Acute Rehab OT Goals Patient Stated Goal: to get home with his wife OT Goal Formulation: With patient Time For Goal Achievement: 04/07/19 Potential to Achieve Goals: Good ADL Goals Pt Will Perform Grooming: with modified independence;standing;sitting Pt Will Perform Lower Body Bathing: with modified independence;sit to/from stand Pt Will Perform Lower Body Dressing: with modified independence;sit to/from stand Pt Will Perform Tub/Shower Transfer: Shower transfer;ambulating;3 in 1;rolling walker  OT Frequency: Min 2X/week   Barriers to D/C:            Co-evaluation              AM-PAC OT "6 Clicks" Daily Activity     Outcome Measure Help from another person eating meals?: None Help from another person taking care of personal grooming?: None Help from another person  toileting, which includes using toliet, bedpan, or urinal?: None Help from another person bathing (including washing, rinsing, drying)?: A Little Help from another person to put on and taking off regular upper body clothing?: None Help from another person to put on and taking off regular lower body clothing?: A Little 6 Click Score: 22   End of Session Equipment Utilized During Treatment: Rolling walker  Activity Tolerance: Patient tolerated treatment well Patient left: in chair;with call bell/phone within reach  OT Visit Diagnosis: Unsteadiness on feet (R26.81);Pain;Muscle weakness (generalized) (M62.81)                Time: [id number] OT Time Calculation (min): 18 min Charges:  OT General Charges $OT Visit: 1 Visit OT Evaluation $OT Eval Low Complexity: [place], OT Acute Rehabilitation Services Pager: [phone] Office: [phone]   Hortencia Pilar 03/24/2019, 10:12 AM

## 2019-04-06 ENCOUNTER — Telehealth: Payer: Self-pay | Admitting: Physician Assistant

## 2019-04-06 DIAGNOSIS — E669 Obesity, unspecified: Secondary | ICD-10-CM

## 2019-04-06 HISTORY — DX: Obesity, unspecified: E66.9

## 2019-04-06 NOTE — Progress Notes (Signed)
Cardiology Office Note    Date:  04/07/2019   ID:  Aaron Schmidt, DOB 11-03-1932, MRN [id number]  PCP:  Hulan Fess, MD  Cardiologist: Ena Dawley, MD EPS: None  Chief Complaint  Patient presents with  . Hospitalization Follow-up    History of Present Illness:  Aaron Schmidt is a 85 y.o. male with history of hypertension, HLD, chronic LBBB, OSA on CPAP.  Patient was admitted with chest pain and confusion.  Troponin elevated to 2263.  EKG with chronic left bundle branch block.  Cardiac catheterization 03/23/2019 no evidence of CAD.  2D echo LVEF 45 to 50% with moderate concentric LVH and impaired relaxation.  When compared to echo in 2018 decrease in LV function with hypokinesis of the basal and mid inferior septal and inferior walls.  Was felt he could potentially could have had a mild myocarditis with new LV dysfunction.  Losartan 25 mg was added.  Encephalopathy resolved.  Patient comes in accompanied by his wife. His mind has cleared. Denies chest pain. Very weak and tired. Never started losartan or coreg because BP had been running low but coming up the last few days.   Past Medical History:  Diagnosis Date  . Arthritis    Degeneration spine & stenosis  . History of kidney stones   . Hypertension    has a histroy of  . Hypotension   . Sleep apnea 2012   used CPAP 2 yrs. ago, feels he sleeps better w/o, no longer using     Past Surgical History:  Procedure Laterality Date  . ARM NEUROPLASTY     at 14 yrs. of age- fell off tractor- had fracture & repair *& later- 50's had  transplantation of a nerve at the elbow  . BACK SURGERY     x4 back surgery x2 fusion -  . CARPAL TUNNEL RELEASE Right   . COLONOSCOPY    . ESOPHAGOGASTRODUODENOSCOPY (EGD) WITH PROPOFOL N/A 09/23/2017   Procedure: ESOPHAGOGASTRODUODENOSCOPY (EGD) WITH PROPOFOL;  Surgeon: Laurence Spates, MD;  Location: [facility];  Service: Endoscopy;  Laterality: N/A;  . LEFT HEART CATH AND CORONARY  ANGIOGRAPHY N/A 03/23/2019   Procedure: LEFT HEART CATH AND CORONARY ANGIOGRAPHY;  Surgeon: Burnell Blanks, MD;  Location: [facility];  Service: Cardiovascular;  Laterality: N/A;  . PLEURAL EFFUSION DRAINAGE Left 09/09/2017   Procedure: DRAINAGE OF PLEURAL EFFUSION;  Surgeon: [name], MD;  Location: [facility];  Service: Thoracic;  Laterality: Left;  . POSTERIOR CERVICAL FUSION/FORAMINOTOMY N/A 06/24/2014   Procedure: Posterior Cervical Three-Seven Fusion with Lateral Mass Fixation;  Surgeon: Erline Levine, MD;  Location: [facility];  Service: Neurosurgery;  Laterality: N/A;  C3-C7 posterior cervical fusion with lateral mass fixation  . SHOULDER SURGERY Bilateral   . VIDEO ASSISTED THORACOSCOPY (VATS)/DECORTICATION Left 09/09/2017   Procedure: VIDEO ASSISTED THORACOSCOPY (VATS)/DECORTICATION;  Surgeon: [name], MD;  Location: [facility];  Service: Thoracic;  Laterality: Left;  Marland Kitchen VIDEO BRONCHOSCOPY  09/09/2017   Procedure: VIDEO BRONCHOSCOPY;  Surgeon: [name], MD;  Location: [facility];  Service: Thoracic;;    Current Medications: Current Meds  Medication Sig  . aspirin EC 81 MG tablet Take 81 mg by mouth daily.  . Biotin 10000 MCG TABS Take 1 tablet by mouth daily.  . carvedilol (COREG) 6.25 MG tablet Take 1 tablet (6.25 mg total) by mouth 2 (two) times daily with a meal.  . Cholecalciferol (VITAMIN D) 2000 UNITS CAPS Take 2,000 Units by mouth  daily.   . diclofenac sodium (VOLTAREN) 1 % GEL Apply 2 g topically 4 (four) times daily for 25 days.  . feeding supplement, ENSURE ENLIVE, (ENSURE ENLIVE) LIQD Take 237 mLs by mouth 2 (two) times daily between meals.  . Ferrous Sulfate (IRON) 325 (65 Fe) MG TABS Take 1 tablet by mouth every other day.   . finasteride (PROSCAR) 5 MG tablet Take 5 mg by mouth daily.  . Flaxseed, Linseed, (FLAX SEED OIL) 1000 MG CAPS Take 1,000 mg by mouth daily.   . folic acid (FOLVITE) 578 MCG tablet Take 400 mcg by mouth  daily.  Marland Kitchen HYDROmorphone (DILAUDID) 2 MG tablet Take 2 mg by mouth 4 (four) times daily as needed for severe pain.  Marland Kitchen L-Lysine 500 MG CAPS Take 500 mg by mouth daily.   . mineral oil liquid Take 15 mLs by mouth at bedtime.   . mineral oil liquid Take 15 mLs by mouth daily as needed for moderate constipation.  . Multiple Vitamin (MULTIVITAMIN WITH MINERALS) TABS tablet Take 1 tablet by mouth daily. Mature mens 70+  . NIACIN CR PO Take 25 mg by mouth daily.  . nortriptyline (PAMELOR) 25 MG capsule Take 25 mg by mouth at bedtime.   Marland Kitchen OVER THE COUNTER MEDICATION Take 6 tablets by mouth daily. Perdiem 15 mg  . PANTOTHENIC ACID PO Take 5.5 mg by mouth daily.  . polyethylene glycol (MIRALAX / GLYCOLAX) packet Take 17 g by mouth daily.  . pregabalin (LYRICA) 150 MG capsule Take 150 mg by mouth 2 (two) times daily.  Marland Kitchen pyridOXINE (VITAMIN B-6) 100 MG tablet Take 100 mg by mouth daily.    . saw palmetto 80 MG capsule Take 240 mg by mouth daily.   Marland Kitchen senna (SENOKOT) 8.6 MG TABS tablet Take 1 tablet (8.6 mg total) by mouth 2 (two) times a day.  . Senna-Psyllium (PERDIEM PO) Take 7 tablets by mouth daily.   . simvastatin (ZOCOR) 40 MG tablet Take 40 mg by mouth at bedtime.   . SODIUM CHLORIDE PO Take by mouth daily. 10 mg  . tiZANidine (ZANAFLEX) 2 MG tablet Take 2 mg by mouth 3 (three) times daily.   . vitamin B-12 (CYANOCOBALAMIN) 100 MCG tablet Take 100 mcg by mouth daily.  . vitamin C (ASCORBIC ACID) 500 MG tablet Take 500 mg by mouth daily.     Allergies:   Diazepam   Social History   Socioeconomic History  . Marital status: Married    Spouse name: Not on file  . Number of children: Not on file  . Years of education: Not on file  . Highest education level: Not on file  Occupational History  . Occupation: Retired    Comment: Worked for [employer] and retired in [place]  . Financial resource strain: Not on file  . Food insecurity    Worry: Not on file    Inability: Not on file  .  Transportation needs    Medical: Not on file    Non-medical: Not on file  Tobacco Use  . Smoking status: Former Smoker    Years: 24.00    Types: Pipe    Quit date: 09/24/1978    Years since quitting: 40.5  . Smokeless tobacco: Never Used  Substance and Sexual Activity  . Alcohol use: Yes    Comment: 3 drinks/day - gin or vodka   . Drug use: No  . Sexual activity: Not on file  Lifestyle  . Physical activity  Days per week: Not on file    Minutes per session: Not on file  . Stress: Not on file  Relationships  . Social Herbalist on phone: Not on file    Gets together: Not on file    Attends religious service: Not on file    Active member of club or organization: Not on file    Attends meetings of clubs or organizations: Not on file    Relationship status: Not on file  Other Topics Concern  . Not on file  Social History Narrative  . Not on file     Family History:  The patient's   family history includes Pancreatic cancer in his mother.   ROS:   Please see the history of present illness.    ROS All other systems reviewed and are negative.   PHYSICAL EXAM:   VS:  BP 128/60   Pulse 71   Ht 5' 10.5" (1.791 m)   Wt 174 lb 6.4 oz (79.1 kg)   SpO2 99%   BMI 24.67 kg/m   Physical Exam  GEN: Thin,elderly, in no acute distress  Neck: no JVD, carotid bruits, or masses HFWYOVZ:CHY;8/5 systolic murmur LSB and apex, 2/6 diastolic murmur LSB Respiratory:  clear to auscultation bilaterally, normal work of breathing GI: soft, nontender, nondistended, + BS Ext: without cyanosis, clubbing, or edema, Good distal pulses bilaterally Neuro:  Alert and Oriented x 3 Psych: euthymic mood, full affect  Wt Readings from Last 3 Encounters:  04/07/19 174 lb 6.4 oz (79.1 kg)  03/24/19 171 lb 15.3 oz (78 kg)  02/10/19 171 lb (77.6 kg)      Studies/Labs Reviewed:   EKG:  EKG is not ordered today.    Recent Labs: 03/21/2019: ALT 25 03/23/2019: Hemoglobin 13.1; Platelets 148  03/24/2019: BUN 11; Creatinine, Ser 0.91; Magnesium 1.7; Potassium 3.6; Sodium 141   Lipid Panel    Component Value Date/Time   CHOL 139 03/22/2019 1200   TRIG 72 03/22/2019 1200   HDL 66 03/22/2019 1200   CHOLHDL 2.1 03/22/2019 1200   VLDL 14 03/22/2019 1200   LDLCALC 59 03/22/2019 1200    Additional studies/ records that were reviewed today include:  Left heart cath 6/29/20201. No angiographic evidence of CAD   Recommendations: No further ischemic workup.    2D echo 6/28/2020IMPRESSIONS      1. The left ventricle has mildly reduced systolic function, with an ejection fraction of 45-50%. The cavity size was normal. There is moderate concentric left ventricular hypertrophy. Left ventricular diastolic Doppler parameters are consistent with  impaired relaxation. Elevated left ventricular end-diastolic pressure.  2. The right ventricle has normal systolic function. The cavity was normal. There is no increase in right ventricular wall thickness. Right ventricular systolic pressure is moderately elevated with an estimated pressure of 42.9 mmHg.  3. Trivial pericardial effusion is present.  4. The mitral valve is degenerative. Moderate thickening of the mitral valve leaflet. Mild calcification of the mitral valve leaflet. Mitral valve regurgitation is mild to moderate by color flow Doppler.  5. The aortic valve is tricuspid. Moderate thickening of the aortic valve. Mild calcification of the aortic valve. Aortic valve regurgitation was not assessed by color flow Doppler. Mild stenosis of the aortic valve.  6. The inferior vena cava was dilated in size with >50% respiratory variability.   SUMMARY   When compared to the prior study from 01/15/2017 LVEF has decreased from 60-65% to 45-50% with hypokinesis of the basal and  mid inferoseptal and inferior walls.  FINDINGS  Left Ventricle: The left ventricle has mildly reduced systolic function, with an ejection fraction of 45-50%. The cavity  size was normal. There is moderate concentric left ventricular hypertrophy. Left ventricular diastolic Doppler parameters are  consistent with impaired relaxation. Elevated left ventricular end-diastolic pressure     ASSESSMENT:    1. NSTEMI (non-ST elevated myocardial infarction) ([place])   2. Essential hypertension   3. Hyperlipidemia, unspecified hyperlipidemia type   4. OBSTRUCTIVE SLEEP APNEA      PLAN:  In order of problems listed above:  Status post NSTEMI with normal coronary arteries on cath 03/23/2019 but new LV dysfunction EF 45 to 50% with wall motion abnormalities could represent mild myocarditis. Patient never started coreg or Losartan because of low BP. BP coming up the past couple of days. Try to start coreg 3.125 mg BID for 3 days and if BP stable, start losartan 12.5 mg daily.  Essential hypertension BP running low since hospitalization  Hyperlipidemia on simvastatin   OSA on CPAP    Medication Adjustments/Labs and Tests Ordered: Current medicines are reviewed at length with the patient today.  Concerns regarding medicines are outlined above.  Medication changes, Labs and Tests ordered today are listed in the Patient Instructions below. Patient Instructions   Medication Instructions:   Your physician has recommended you make the following change in your medication:   Start Carvedilol 6.25MG , 1/2 tablet twice a day. If blood pressures are stable after 3 days, then start Losartan 25MG , 1/2 tablet once a day.  If you need a refill on your cardiac medications before your next appointment, please call your pharmacy.   Lab work:  None ordered today  If you have labs (blood work) drawn today and your tests are completely normal, you will receive your results only by: Marland Kitchen MyChart Message (if you have MyChart) OR . A paper copy in the mail If you have any lab test that is abnormal or we need to change your treatment, we will call you to review the results.   Testing/Procedures:  None ordered today  Follow-Up:   Follow up with Ermalinda Barrios, PA in 2 weeks: 04/21/19 at 9:30AM  Any Other Special Instructions Will Be Listed Below (If Applicable).   Two Gram Sodium Diet 2000 mg  What is Sodium? Sodium is a mineral found naturally in many foods. The most significant source of sodium in the diet is table salt, which is about 40% sodium.  Processed, convenience, and preserved foods also contain a large amount of sodium.  The body needs only 500 mg of sodium daily to function,  A normal diet provides more than enough sodium even if you do not use salt.  Why Limit Sodium? A build up of sodium in the body can cause thirst, increased blood pressure, shortness of breath, and water retention.  Decreasing sodium in the diet can reduce edema and risk of heart attack or stroke associated with high blood pressure.  Keep in mind that there are many other factors involved in these health problems.  Heredity, obesity, lack of exercise, cigarette smoking, stress and what you eat all play a role.  General Guidelines:  Do not add salt at the table or in cooking.  One teaspoon of salt contains over 2 grams of sodium.  Read food labels  Avoid processed and convenience foods  Ask your dietitian before eating any foods not dicussed in the menu planning guidelines  Consult your physician if you  wish to use a salt substitute or a sodium containing medication such as antacids.  Limit milk and milk products to 16 oz (2 cups) per day.  Shopping Hints:  READ LABELS!! "Dietetic" does not necessarily mean low sodium.  Salt and other sodium ingredients are often added to foods during processing.   Menu Planning Guidelines Food Group Choose More Often Avoid  Beverages (see also the milk group All fruit juices, low-sodium, salt-free vegetables juices, low-sodium carbonated beverages Regular vegetable or tomato juices, commercially softened water used for drinking or  cooking  Breads and Cereals Enriched white, wheat, rye and pumpernickel bread, hard rolls and dinner rolls; muffins, cornbread and waffles; most dry cereals, cooked cereal without added salt; unsalted crackers and breadsticks; low sodium or homemade bread crumbs Bread, rolls and crackers with salted tops; quick breads; instant hot cereals; pancakes; commercial bread stuffing; self-rising flower and biscuit mixes; regular bread crumbs or cracker crumbs  Desserts and Sweets Desserts and sweets mad with mild should be within allowance Instant pudding mixes and cake mixes  Fats Butter or margarine; vegetable oils; unsalted salad dressings, regular salad dressings limited to 1 Tbs; light, sour and heavy cream Regular salad dressings containing bacon fat, bacon bits, and salt pork; snack dips made with instant soup mixes or processed cheese; salted nuts  Fruits Most fresh, frozen and canned fruits Fruits processed with salt or sodium-containing ingredient (some dried fruits are processed with sodium sulfites        Vegetables Fresh, frozen vegetables and low- sodium canned vegetables Regular canned vegetables, sauerkraut, pickled vegetables, and others prepared in brine; frozen vegetables in sauces; vegetables seasoned with ham, bacon or salt pork  Condiments, Sauces, Miscellaneous  Salt substitute with physician's approval; pepper, herbs, spices; vinegar, lemon or lime juice; hot pepper sauce; garlic powder, onion powder, low sodium soy sauce (1 Tbs.); low sodium condiments (ketchup, chili sauce, mustard) in limited amounts (1 tsp.) fresh ground horseradish; unsalted tortilla chips, pretzels, potato chips, popcorn, salsa (1/4 cup) Any seasoning made with salt including garlic salt, celery salt, onion salt, and seasoned salt; sea salt, rock salt, kosher salt; meat tenderizers; monosodium glutamate; mustard, regular soy sauce, barbecue, sauce, chili sauce, teriyaki sauce, steak sauce, Worcestershire sauce, and  most flavored vinegars; canned gravy and mixes; regular condiments; salted snack foods, olives, picles, relish, horseradish sauce, catsup   Food preparation: Try these seasonings Meats:    Pork Sage, onion Serve with applesauce  Chicken Poultry seasoning, thyme, parsley Serve with cranberry sauce  Lamb Curry powder, rosemary, garlic, thyme Serve with mint sauce or jelly  Veal Marjoram, basil Serve with current jelly, cranberry sauce  Beef Pepper, bay leaf Serve with dry mustard, unsalted chive butter  Fish [place] leaf, dill Serve with unsalted lemon butter, unsalted parsley butter  Vegetables:    Asparagus Lemon juice   Broccoli Lemon juice   Carrots Mustard dressing parsley, mint, nutmeg, glazed with unsalted butter and sugar   Green beans Marjoram, lemon juice, nutmeg,dill seed   Tomatoes Basil, marjoram, onion   Spice /blend for [employer]" 4 tsp ground thyme 1 tsp ground sage 3 tsp ground rosemary 4 tsp ground marjoram   Test your knowledge 1. A product that says "Salt Free" may still contain sodium. True or False 2. Garlic Powder and Hot Pepper Sauce an be used as alternative seasonings.True or False 3. Processed foods have more sodium than fresh foods.  True or False 4. Canned Vegetables have less sodium than froze True or False  WAYS TO DECREASE YOUR SODIUM INTAKE 1. Avoid the use of added salt in cooking and at the table.  Table salt (and other prepared seasonings which contain salt) is probably one of the greatest sources of sodium in the diet.  Unsalted foods can gain flavor from the sweet, sour, and butter taste sensations of herbs and spices.  Instead of using salt for seasoning, try the following seasonings with the foods listed.  Remember: how you use them to enhance natural food flavors is limited only by your creativity... Allspice-Meat, fish, eggs, fruit, peas, red and yellow vegetables Almond Extract-Fruit baked goods Anise Seed-Sweet breads, fruit, carrots, beets,  cottage cheese, cookies (tastes like licorice) Basil-Meat, fish, eggs, vegetables, rice, vegetables salads, soups, sauces Bay Leaf-Meat, fish, stews, poultry Burnet-Salad, vegetables (cucumber-like flavor) Caraway Seed-Bread, cookies, cottage cheese, meat, vegetables, cheese, rice Cardamon-Baked goods, fruit, soups Celery Powder or seed-Salads, salad dressings, sauces, meatloaf, soup, bread.Do not use  celery salt Chervil-Meats, salads, fish, eggs, vegetables, cottage cheese (parsley-like flavor) Chili Power-Meatloaf, chicken cheese, corn, eggplant, egg dishes Chives-Salads cottage cheese, egg dishes, soups, vegetables, sauces Cilantro-Salsa, casseroles Cinnamon-Baked goods, fruit, pork, lamb, chicken, carrots Cloves-Fruit, baked goods, fish, pot roast, green beans, beets, carrots Coriander-Pastry, cookies, meat, salads, cheese (lemon-orange flavor) Cumin-Meatloaf, fish,cheese, eggs, cabbage,fruit pie (caraway flavor) [employer], fruit, eggs, fish, poultry, cottage cheese, vegetables Dill Seed-Meat, cottage cheese, poultry, vegetables, fish, salads, bread Fennel Seed-Bread, cookies, apples, pork, eggs, fish, beets, cabbage, cheese, Licorice-like flavor Garlic-(buds or powder) Salads, meat, poultry, fish, bread, butter, vegetables, potatoes.Do not  use garlic salt Ginger-Fruit, vegetables, baked goods, meat, fish, poultry Horseradish Root-Meet, vegetables, butter Lemon Juice or Extract-Vegetables, fruit, tea, baked goods, fish salads Mace-Baked goods fruit, vegetables, fish, poultry (taste like nutmeg) Maple Extract-Syrups Marjoram-Meat, chicken, fish, vegetables, breads, green salads (taste like Sage) Mint-Tea, lamb, sherbet, vegetables, desserts, carrots, cabbage Mustard, Dry or Seed-Cheese, eggs, meats, vegetables, poultry Nutmeg-Baked goods, fruit, chicken, eggs, vegetables, desserts Onion Powder-Meat, fish, poultry, vegetables, cheese, eggs, bread, rice salads (Do not use    Onion salt) Orange Extract-Desserts, baked goods Oregano-Pasta, eggs, cheese, onions, pork, lamb, fish, chicken, vegetables, green salads Paprika-Meat, fish, poultry, eggs, cheese, vegetables Parsley Flakes-Butter, vegetables, meat fish, poultry, eggs, bread, salads (certain forms may   Contain sodium Pepper-Meat fish, poultry, vegetables, eggs Peppermint Extract-Desserts, baked goods Poppy Seed-Eggs, bread, cheese, fruit dressings, baked goods, noodles, vegetables, cottage  [employer], poultry, meat, fish, cauliflower, turnips,eggs bread Saffron-Rice, bread, veal, chicken, fish, eggs Sage-Meat, fish, poultry, onions, eggplant, tomateos, pork, stews Savory-Eggs, salads, poultry, meat, rice, vegetables, soups, pork Tarragon-Meat, poultry, fish, eggs, butter, vegetables (licorice-like flavor)  Thyme-Meat, poultry, fish, eggs, vegetables, (clover-like flavor), sauces, soups Tumeric-Salads, butter, eggs, fish, rice, vegetables (saffron-like flavor) Vanilla Extract-Baked goods, candy Vinegar-Salads, vegetables, meat marinades Walnut Extract-baked goods, candy  2. Choose your Foods Wisely   The following is a list of foods to avoid which are high in sodium:  Meats-Avoid all smoked, canned, salt cured, dried and kosher meat and fish as well as Anchovies   [employer] meats:Bologna, Liverwurst, Pastrami Canned meat or fish  Marinated herring Caviar    Pepperoni Corned Beef   Pizza Dried chipped beef  Salami Frozen breaded fish or meat Salt pork Frankfurters or hot dogs  Sardines Gefilte fish   Sausage Ham (boiled ham, Proscuitto Smoked butt    spiced ham)   Spam      TV Dinners Vegetables Canned vegetables (Regular) Relish Canned mushrooms  Sauerkraut Olives  Tomato juice Pickles  Bakery and Dessert Products Canned puddings  Cream pies Cheesecake   Decorated cakes Cookies  Beverages/Juices Tomato juice, regular  Gatorade    V-8 vegetable juice, regular  Breads and Cereals Biscuit mixes   Salted potato chips, corn chips, pretzels Bread stuffing mixes  Salted crackers and rolls Pancake and waffle mixes Self-rising flour  Seasonings Accent    Meat sauces Barbecue sauce  Meat tenderizer Catsup    Monosodium glutamate (MSG) Celery salt   Onion salt Chili sauce   Prepared mustard Garlic salt   Salt, seasoned salt, sea salt Gravy mixes   Soy sauce Horseradish   Steak sauce Ketchup   Tartar sauce Lite salt    Teriyaki sauce Marinade mixes   Worcestershire sauce  Others Baking powder   Cocoa and cocoa mixes Baking soda   Commercial casserole mixes Candy-caramels, chocolate  Dehydrated soups    Bars, fudge,nougats  Instant rice and pasta mixes Canned broth or soup  Maraschino cherries Cheese, aged and processed cheese and cheese spreads  Learning Assessment Quiz  Indicated T (for True) or F (for False) for each of the following statements:  1. _____ Fresh fruits and vegetables and unprocessed grains are generally low in sodium 2. _____ Water may contain a considerable amount of sodium, depending on the source 3. _____ You can always tell if a food is high in sodium by tasting it 4. _____ Certain laxatives my be high in sodium and should be avoided unless prescribed   by a physician or pharmacist 5. _____ Salt substitutes may be used freely by anyone on a sodium restricted diet 6. _____ Sodium is present in table salt, food additives and as a natural component of   most foods 7. _____ Table salt is approximately 90% sodium 8. _____ Limiting sodium intake may help prevent excess fluid accumulation in the body 9. _____ On a sodium-restricted diet, seasonings such as bouillon soy sauce, and    cooking wine should be used in place of table salt 10. _____ On an ingredient list, a product which lists monosodium glutamate as the first   ingredient is an appropriate food to include on a low sodium diet  Circle  the best answer(s) to the following statements (Hint: there may be more than one correct answer)  11. On a low-sodium diet, some acceptable snack items are:    A. Olives  F. Bean dip   K. Grapefruit juice    B. Salted Pretzels G. Commercial Popcorn   L. Canned peaches    C. Carrot Sticks  H. Bouillon   M. Unsalted nuts   D. [place] fries  I. Peanut butter crackers N. Salami   E. Sweet pickles J. Tomato Juice   O. Pizza  12.  Seasonings that may be used freely on a reduced - sodium diet include   A. Lemon wedges F.Monosodium glutamate K. Celery seed    B.Soysauce   G. Pepper   L. Mustard powder   C. Sea salt  H. Cooking wine  M. Onion flakes   D. Vinegar  E. Prepared horseradish N. Salsa   E. Sage   J. Worcestershire sauce  O. Chutney       Signed, Ermalinda Barrios, PA-C  04/07/2019 10:16 AM    [facility] [address], [place], [place]  [address] Phone: [phone]; Fax: [phone]

## 2019-04-06 NOTE — Telephone Encounter (Signed)
New Message         COVID-19 Pre-Screening Questions:   In the past 7 to 10 days have you had a cough,  shortness of breath, headache, congestion, fever (100 or greater) body aches, chills, sore throat, or sudden loss of taste or sense of smell? NO  Have you been around anyone with known Covid 19. NO  Have you been around anyone who is awaiting Covid 19 test results in the past 7 to 10 days? NO  Have you been around anyone who has been exposed to Covid 19, or has mentioned symptoms of Covid 19 within the past 7 to 10 days? NO Pts wife will assist pt to appt due to hearing issues Pts wife answered  NO to all screening questions   If you have any concerns/questions about symptoms patients report during screening (either on the phone or at threshold). Contact the provider seeing the patient or DOD for further guidance.  If neither are available contact a member of the leadership team.

## 2019-04-07 ENCOUNTER — Ambulatory Visit (INDEPENDENT_AMBULATORY_CARE_PROVIDER_SITE_OTHER): Payer: Medicare Other | Admitting: Physician Assistant

## 2019-04-07 ENCOUNTER — Encounter: Payer: Self-pay | Admitting: Physician Assistant

## 2019-04-07 ENCOUNTER — Other Ambulatory Visit: Payer: Self-pay

## 2019-04-07 VITALS — BP 128/60 | HR 71 | Ht 70.5 in | Wt 174.4 lb

## 2019-04-07 DIAGNOSIS — E785 Hyperlipidemia, unspecified: Secondary | ICD-10-CM

## 2019-04-07 DIAGNOSIS — I10 Essential (primary) hypertension: Secondary | ICD-10-CM

## 2019-04-07 DIAGNOSIS — I214 Non-ST elevation (NSTEMI) myocardial infarction: Secondary | ICD-10-CM | POA: Diagnosis not present

## 2019-04-07 DIAGNOSIS — G4733 Obstructive sleep apnea (adult) (pediatric): Secondary | ICD-10-CM | POA: Diagnosis not present

## 2019-04-07 MED ORDER — LOSARTAN POTASSIUM 25 MG PO TABS: 12.5000 mg | ORAL_TABLET | Freq: Every day | ORAL | 0 refills | Status: DC

## 2019-04-07 NOTE — Addendum Note (Signed)
Addended by: Mady Haagensen on: 04/07/2019 10:25 AM   Modules accepted: Orders

## 2019-04-07 NOTE — Patient Instructions (Signed)
Medication Instructions:   Your physician has recommended you make the following change in your medication:   Start Carvedilol 6.25MG , 1/2 tablet twice a day. If blood pressures are stable after 3 days, then start Losartan 25MG , 1/2 tablet once a day.  If you need a refill on your cardiac medications before your next appointment, please call your pharmacy.   Lab work:  None ordered today  If you have labs (blood work) drawn today and your tests are completely normal, you will receive your results only by: Marland Kitchen MyChart Message (if you have MyChart) OR . A paper copy in the mail If you have any lab test that is abnormal or we need to change your treatment, we will call you to review the results.  Testing/Procedures:  None ordered today  Follow-Up:   Follow up with Ermalinda Barrios, PA in 2 weeks: 04/21/19 at 9:30AM  Any Other Special Instructions Will Be Listed Below (If Applicable).   Two Gram Sodium Diet 2000 mg  What is Sodium? Sodium is a mineral found naturally in many foods. The most significant source of sodium in the diet is table salt, which is about 40% sodium.  Processed, convenience, and preserved foods also contain a large amount of sodium.  The body needs only 500 mg of sodium daily to function,  A normal diet provides more than enough sodium even if you do not use salt.  Why Limit Sodium? A build up of sodium in the body can cause thirst, increased blood pressure, shortness of breath, and water retention.  Decreasing sodium in the diet can reduce edema and risk of heart attack or stroke associated with high blood pressure.  Keep in mind that there are many other factors involved in these health problems.  Heredity, obesity, lack of exercise, cigarette smoking, stress and what you eat all play a role.  General Guidelines:  Do not add salt at the table or in cooking.  One teaspoon of salt contains over 2 grams of sodium.  Read food labels  Avoid processed and convenience  foods  Ask your dietitian before eating any foods not dicussed in the menu planning guidelines  Consult your physician if you wish to use a salt substitute or a sodium containing medication such as antacids.  Limit milk and milk products to 16 oz (2 cups) per day.  Shopping Hints:  READ LABELS!! "Dietetic" does not necessarily mean low sodium.  Salt and other sodium ingredients are often added to foods during processing.   Menu Planning Guidelines Food Group Choose More Often Avoid  Beverages (see also the milk group All fruit juices, low-sodium, salt-free vegetables juices, low-sodium carbonated beverages Regular vegetable or tomato juices, commercially softened water used for drinking or cooking  Breads and Cereals Enriched white, wheat, rye and pumpernickel bread, hard rolls and dinner rolls; muffins, cornbread and waffles; most dry cereals, cooked cereal without added salt; unsalted crackers and breadsticks; low sodium or homemade bread crumbs Bread, rolls and crackers with salted tops; quick breads; instant hot cereals; pancakes; commercial bread stuffing; self-rising flower and biscuit mixes; regular bread crumbs or cracker crumbs  Desserts and Sweets Desserts and sweets mad with mild should be within allowance Instant pudding mixes and cake mixes  Fats Butter or margarine; vegetable oils; unsalted salad dressings, regular salad dressings limited to 1 Tbs; light, sour and heavy cream Regular salad dressings containing bacon fat, bacon bits, and salt pork; snack dips made with instant soup mixes or processed cheese; salted nuts  Fruits Most fresh, frozen and canned fruits Fruits processed with salt or sodium-containing ingredient (some dried fruits are processed with sodium sulfites        Vegetables Fresh, frozen vegetables and low- sodium canned vegetables Regular canned vegetables, sauerkraut, pickled vegetables, and others prepared in brine; frozen vegetables in sauces; vegetables  seasoned with ham, bacon or salt pork  Condiments, Sauces, Miscellaneous  Salt substitute with physician's approval; pepper, herbs, spices; vinegar, lemon or lime juice; hot pepper sauce; garlic powder, onion powder, low sodium soy sauce (1 Tbs.); low sodium condiments (ketchup, chili sauce, mustard) in limited amounts (1 tsp.) fresh ground horseradish; unsalted tortilla chips, pretzels, potato chips, popcorn, salsa (1/4 cup) Any seasoning made with salt including garlic salt, celery salt, onion salt, and seasoned salt; sea salt, rock salt, kosher salt; meat tenderizers; monosodium glutamate; mustard, regular soy sauce, barbecue, sauce, chili sauce, teriyaki sauce, steak sauce, Worcestershire sauce, and most flavored vinegars; canned gravy and mixes; regular condiments; salted snack foods, olives, picles, relish, horseradish sauce, catsup   Food preparation: Try these seasonings Meats:    Pork Sage, onion Serve with applesauce  Chicken Poultry seasoning, thyme, parsley Serve with cranberry sauce  Lamb Curry powder, rosemary, garlic, thyme Serve with mint sauce or jelly  Veal Marjoram, basil Serve with current jelly, cranberry sauce  Beef Pepper, bay leaf Serve with dry mustard, unsalted chive butter  Fish [place] leaf, dill Serve with unsalted lemon butter, unsalted parsley butter  Vegetables:    Asparagus Lemon juice   Broccoli Lemon juice   Carrots Mustard dressing parsley, mint, nutmeg, glazed with unsalted butter and sugar   Green beans Marjoram, lemon juice, nutmeg,dill seed   Tomatoes Basil, marjoram, onion   Spice /blend for [employer]" 4 tsp ground thyme 1 tsp ground sage 3 tsp ground rosemary 4 tsp ground marjoram   Test your knowledge 1. A product that says "Salt Free" may still contain sodium. True or False 2. Garlic Powder and Hot Pepper Sauce an be used as alternative seasonings.True or False 3. Processed foods have more sodium than fresh foods.  True or False 4. Canned  Vegetables have less sodium than froze True or False  WAYS TO DECREASE YOUR SODIUM INTAKE 1. Avoid the use of added salt in cooking and at the table.  Table salt (and other prepared seasonings which contain salt) is probably one of the greatest sources of sodium in the diet.  Unsalted foods can gain flavor from the sweet, sour, and butter taste sensations of herbs and spices.  Instead of using salt for seasoning, try the following seasonings with the foods listed.  Remember: how you use them to enhance natural food flavors is limited only by your creativity... Allspice-Meat, fish, eggs, fruit, peas, red and yellow vegetables Almond Extract-Fruit baked goods Anise Seed-Sweet breads, fruit, carrots, beets, cottage cheese, cookies (tastes like licorice) Basil-Meat, fish, eggs, vegetables, rice, vegetables salads, soups, sauces Bay Leaf-Meat, fish, stews, poultry Burnet-Salad, vegetables (cucumber-like flavor) Caraway Seed-Bread, cookies, cottage cheese, meat, vegetables, cheese, rice Cardamon-Baked goods, fruit, soups Celery Powder or seed-Salads, salad dressings, sauces, meatloaf, soup, bread.Do not use  celery salt Chervil-Meats, salads, fish, eggs, vegetables, cottage cheese (parsley-like flavor) Chili Power-Meatloaf, chicken cheese, corn, eggplant, egg dishes Chives-Salads cottage cheese, egg dishes, soups, vegetables, sauces Cilantro-Salsa, casseroles Cinnamon-Baked goods, fruit, pork, lamb, chicken, carrots Cloves-Fruit, baked goods, fish, pot roast, green beans, beets, carrots Coriander-Pastry, cookies, meat, salads, cheese (lemon-orange flavor) Cumin-Meatloaf, fish,cheese, eggs, cabbage,fruit pie (caraway flavor) Curry Powder-Meat, fruit, eggs,  fish, poultry, cottage cheese, vegetables Dill Seed-Meat, cottage cheese, poultry, vegetables, fish, salads, bread Fennel Seed-Bread, cookies, apples, pork, eggs, fish, beets, cabbage, cheese, Licorice-like flavor Garlic-(buds or powder) Salads,  meat, poultry, fish, bread, butter, vegetables, potatoes.Do not  use garlic salt Ginger-Fruit, vegetables, baked goods, meat, fish, poultry Horseradish Root-Meet, vegetables, butter Lemon Juice or Extract-Vegetables, fruit, tea, baked goods, fish salads Mace-Baked goods fruit, vegetables, fish, poultry (taste like nutmeg) Maple Extract-Syrups Marjoram-Meat, chicken, fish, vegetables, breads, green salads (taste like Sage) Mint-Tea, lamb, sherbet, vegetables, desserts, carrots, cabbage Mustard, Dry or Seed-Cheese, eggs, meats, vegetables, poultry Nutmeg-Baked goods, fruit, chicken, eggs, vegetables, desserts Onion Powder-Meat, fish, poultry, vegetables, cheese, eggs, bread, rice salads (Do not use   Onion salt) Orange Extract-Desserts, baked goods Oregano-Pasta, eggs, cheese, onions, pork, lamb, fish, chicken, vegetables, green salads Paprika-Meat, fish, poultry, eggs, cheese, vegetables Parsley Flakes-Butter, vegetables, meat fish, poultry, eggs, bread, salads (certain forms may   Contain sodium Pepper-Meat fish, poultry, vegetables, eggs Peppermint Extract-Desserts, baked goods Poppy Seed-Eggs, bread, cheese, fruit dressings, baked goods, noodles, vegetables, cottage  [employer], poultry, meat, fish, cauliflower, turnips,eggs bread Saffron-Rice, bread, veal, chicken, fish, eggs Sage-Meat, fish, poultry, onions, eggplant, tomateos, pork, stews Savory-Eggs, salads, poultry, meat, rice, vegetables, soups, pork Tarragon-Meat, poultry, fish, eggs, butter, vegetables (licorice-like flavor)  Thyme-Meat, poultry, fish, eggs, vegetables, (clover-like flavor), sauces, soups Tumeric-Salads, butter, eggs, fish, rice, vegetables (saffron-like flavor) Vanilla Extract-Baked goods, candy Vinegar-Salads, vegetables, meat marinades Walnut Extract-baked goods, candy  2. Choose your Foods Wisely   The following is a list of foods to avoid which are high in  sodium:  Meats-Avoid all smoked, canned, salt cured, dried and kosher meat and fish as well as Anchovies   [employer] meats:Bologna, Liverwurst, Pastrami Canned meat or fish  Marinated herring Caviar    Pepperoni Corned Beef   Pizza Dried chipped beef  Salami Frozen breaded fish or meat Salt pork Frankfurters or hot dogs  Sardines Gefilte fish   Sausage Ham (boiled ham, Proscuitto Smoked butt    spiced ham)   Spam      TV Dinners Vegetables Canned vegetables (Regular) Relish Canned mushrooms  Sauerkraut Olives    Tomato juice Pickles  Bakery and Dessert Products Canned puddings  Cream pies Cheesecake   Decorated cakes Cookies  Beverages/Juices Tomato juice, regular  Gatorade   V-8 vegetable juice, regular  Breads and Cereals Biscuit mixes   Salted potato chips, corn chips, pretzels Bread stuffing mixes  Salted crackers and rolls Pancake and waffle mixes Self-rising flour  Seasonings Accent    Meat sauces Barbecue sauce  Meat tenderizer Catsup    Monosodium glutamate (MSG) Celery salt   Onion salt Chili sauce   Prepared mustard Garlic salt   Salt, seasoned salt, sea salt Gravy mixes   Soy sauce Horseradish   Steak sauce Ketchup   Tartar sauce Lite salt    Teriyaki sauce Marinade mixes   Worcestershire sauce  Others Baking powder   Cocoa and cocoa mixes Baking soda   Commercial casserole mixes Candy-caramels, chocolate  Dehydrated soups    Bars, fudge,nougats  Instant rice and pasta mixes Canned broth or soup  Maraschino cherries Cheese, aged and processed cheese and cheese spreads  Learning Assessment Quiz  Indicated T (for True) or F (for False) for each of the following statements:  1. _____ Fresh fruits and vegetables and unprocessed grains are generally low in sodium 2. _____ Water may contain a considerable amount of sodium, depending  on the source 3. _____ You can always tell if a food is high in sodium by tasting it 4. _____ Certain  laxatives my be high in sodium and should be avoided unless prescribed   by a physician or pharmacist 5. _____ Salt substitutes may be used freely by anyone on a sodium restricted diet 6. _____ Sodium is present in table salt, food additives and as a natural component of   most foods 7. _____ Table salt is approximately 90% sodium 8. _____ Limiting sodium intake may help prevent excess fluid accumulation in the body 9. _____ On a sodium-restricted diet, seasonings such as bouillon soy sauce, and    cooking wine should be used in place of table salt 10. _____ On an ingredient list, a product which lists monosodium glutamate as the first   ingredient is an appropriate food to include on a low sodium diet  Circle the best answer(s) to the following statements (Hint: there may be more than one correct answer)  11. On a low-sodium diet, some acceptable snack items are:    A. Olives  F. Bean dip   K. Grapefruit juice    B. Salted Pretzels G. Commercial Popcorn   L. Canned peaches    C. Carrot Sticks  H. Bouillon   M. Unsalted nuts   D. [place] fries  I. Peanut butter crackers N. Salami   E. Sweet pickles J. Tomato Juice   O. Pizza  12.  Seasonings that may be used freely on a reduced - sodium diet include   A. Lemon wedges F.Monosodium glutamate K. Celery seed    B.Soysauce   G. Pepper   L. Mustard powder   C. Sea salt  H. Cooking wine  M. Onion flakes   D. Vinegar  E. Prepared horseradish N. Salsa   E. Sage   J. Worcestershire sauce  O. Chutney

## 2019-04-10 ENCOUNTER — Telehealth: Payer: Self-pay | Admitting: Physician Assistant

## 2019-04-10 NOTE — Telephone Encounter (Signed)
New Message    Aaron Schmidt is calling from [place] and says the Pt had appt on the 15th and increase in walking and standing exercises, chronic back pain was flared up from moderate to max pain  Resulting in inability for walking for exercise  No cardiac signs, spine pain specialist have been alerted.  This includes low back, and down the legs  Please call back with any recommendations

## 2019-04-20 ENCOUNTER — Telehealth: Payer: Self-pay | Admitting: Physician Assistant

## 2019-04-20 DIAGNOSIS — I2510 Atherosclerotic heart disease of native coronary artery without angina pectoris: Secondary | ICD-10-CM

## 2019-04-20 HISTORY — DX: Atherosclerotic heart disease of native coronary artery without angina pectoris: I25.10

## 2019-04-20 NOTE — Progress Notes (Signed)
Cardiology Office Note    Date:  04/21/2019   ID:  Aaron Schmidt, Aaron Schmidt 1933-05-10, MRN [id number]  PCP:  Hulan Fess, MD  Cardiologist: Ena Dawley, MD EPS: None  Chief Complaint  Patient presents with  . Follow-up    History of Present Illness:  Aaron Schmidt is a 85 y.o. male with history of hypertension, HLD, chronic LBBB, OSA on CPAP.  Patient was admitted with chest pain and confusion.  Troponin elevated to 2263.  EKG with chronic left bundle branch block.  Cardiac catheterization 03/23/2019 no evidence of CAD.  2D echo LVEF 45 to 50% with moderate concentric LVH and impaired relaxation.  When compared to echo in 2018 decrease in LV function with hypokinesis of the basal and mid inferior septal and inferior walls.  Was felt he could potentially could have had a mild myocarditis with new LV dysfunction.  Losartan 25 mg was added.  Encephalopathy resolved.    I saw the patient 04/07/19 and he hadn't started losartan or coreg b/c of low BP. I started coreg 3.125 mg BID and told them to start losartan 3 days later if BP not too low.   Patient comes in today accompanied by his wife.  He has tolerated both the low-dose Coreg and losartan.  They have a log of his blood pressures since starting both.  Yesterday his blood pressure dropped to the 12Y systolic and he was a little dizzy.  This was the only reading.  He may not be staying hydrated enough according to his wife.  Overall he feels well.  Walking with physical therapy and has severe back pain walking around his dining room 5 times.   Past Medical History:  Diagnosis Date  . Arthritis    Degeneration spine & stenosis  . History of kidney stones   . Hypertension    has a histroy of  . Hypotension   . Sleep apnea 2012   used CPAP 2 yrs. ago, feels he sleeps better w/o, no longer using     Past Surgical History:  Procedure Laterality Date  . ARM NEUROPLASTY     at 14 yrs. of age- fell off tractor- had fracture & repair  *& later- 35's had  transplantation of a nerve at the elbow  . BACK SURGERY     x4 back surgery x2 fusion -  . CARPAL TUNNEL RELEASE Right   . COLONOSCOPY    . ESOPHAGOGASTRODUODENOSCOPY (EGD) WITH PROPOFOL N/A 09/23/2017   Procedure: ESOPHAGOGASTRODUODENOSCOPY (EGD) WITH PROPOFOL;  Surgeon: Laurence Spates, MD;  Location: [facility];  Service: Endoscopy;  Laterality: N/A;  . LEFT HEART CATH AND CORONARY ANGIOGRAPHY N/A 03/23/2019   Procedure: LEFT HEART CATH AND CORONARY ANGIOGRAPHY;  Surgeon: Burnell Blanks, MD;  Location: [facility];  Service: Cardiovascular;  Laterality: N/A;  . PLEURAL EFFUSION DRAINAGE Left 09/09/2017   Procedure: DRAINAGE OF PLEURAL EFFUSION;  Surgeon: [name], MD;  Location: [facility];  Service: Thoracic;  Laterality: Left;  . POSTERIOR CERVICAL FUSION/FORAMINOTOMY N/A 06/24/2014   Procedure: Posterior Cervical Three-Seven Fusion with Lateral Mass Fixation;  Surgeon: Erline Levine, MD;  Location: [facility];  Service: Neurosurgery;  Laterality: N/A;  C3-C7 posterior cervical fusion with lateral mass fixation  . SHOULDER SURGERY Bilateral   . VIDEO ASSISTED THORACOSCOPY (VATS)/DECORTICATION Left 09/09/2017   Procedure: VIDEO ASSISTED THORACOSCOPY (VATS)/DECORTICATION;  Surgeon: [name], MD;  Location: [facility];  Service: Thoracic;  Laterality: Left;  Marland Kitchen VIDEO BRONCHOSCOPY  09/09/2017   Procedure: VIDEO BRONCHOSCOPY;  Surgeon: [name], MD;  Location: [facility] OR;  Service: Thoracic;;    Current Medications: Current Meds  Medication Sig  . aspirin EC 81 MG tablet Take 81 mg by mouth daily.  . Biotin 10000 MCG TABS Take 1 tablet by mouth daily.  . carvedilol (COREG) 6.25 MG tablet Take 3.125 mg by mouth 2 (two) times daily with a meal.  . Cholecalciferol (VITAMIN D) 2000 UNITS CAPS Take 2,000 Units by mouth daily.   . feeding supplement, ENSURE ENLIVE, (ENSURE ENLIVE) LIQD Take 237 mLs by mouth 2 (two) times daily between  meals.  . Ferrous Sulfate (IRON) 325 (65 Fe) MG TABS Take 1 tablet by mouth every other day.   . finasteride (PROSCAR) 5 MG tablet Take 5 mg by mouth daily.  . Flaxseed, Linseed, (FLAX SEED OIL) 1000 MG CAPS Take 1,000 mg by mouth daily.   . folic acid (FOLVITE) 496 MCG tablet Take 400 mcg by mouth daily.  Marland Kitchen HYDROmorphone (DILAUDID) 2 MG tablet Take 2 mg by mouth 4 (four) times daily as needed for severe pain.  Marland Kitchen L-Lysine 500 MG CAPS Take 500 mg by mouth daily.   Marland Kitchen losartan (COZAAR) 25 MG tablet Take 0.5 tablets (12.5 mg total) by mouth daily.  . mineral oil liquid Take 15 mLs by mouth at bedtime.   . mineral oil liquid Take 15 mLs by mouth daily as needed for moderate constipation.  . Multiple Vitamin (MULTIVITAMIN WITH MINERALS) TABS tablet Take 1 tablet by mouth daily. Mature mens 70+  . NIACIN CR PO Take 25 mg by mouth daily.  . nortriptyline (PAMELOR) 25 MG capsule Take 25 mg by mouth at bedtime.   Marland Kitchen OVER THE COUNTER MEDICATION Take 6 tablets by mouth daily. Perdiem 15 mg  . PANTOTHENIC ACID PO Take 5.5 mg by mouth daily.  . polyethylene glycol (MIRALAX / GLYCOLAX) packet Take 17 g by mouth daily.  . pregabalin (LYRICA) 150 MG capsule Take 150 mg by mouth 2 (two) times daily.  . saw palmetto 80 MG capsule Take 240 mg by mouth daily.   Marland Kitchen senna (SENOKOT) 8.6 MG TABS tablet Take 1 tablet (8.6 mg total) by mouth 2 (two) times a day.  . Senna-Psyllium (PERDIEM PO) Take 7 tablets by mouth daily.   . simvastatin (ZOCOR) 40 MG tablet Take 40 mg by mouth at bedtime.   . SODIUM CHLORIDE PO Take by mouth daily. 10 mg  . tiZANidine (ZANAFLEX) 2 MG tablet Take 2 mg by mouth 3 (three) times daily.   . vitamin B-12 (CYANOCOBALAMIN) 100 MCG tablet Take 100 mcg by mouth daily.  . vitamin C (ASCORBIC ACID) 500 MG tablet Take 500 mg by mouth daily.  . [DISCONTINUED] carvedilol (COREG) 6.25 MG tablet Take 1 tablet (6.25 mg total) by mouth 2 (two) times daily with a meal.  . [DISCONTINUED] pyridOXINE  (VITAMIN B-6) 100 MG tablet Take 100 mg by mouth daily.       Allergies:   Diazepam   Social History   Socioeconomic History  . Marital status: Married    Spouse name: Not on file  . Number of children: Not on file  . Years of education: Not on file  . Highest education level: Not on file  Occupational History  . Occupation: Retired    Comment: Worked for [employer] and retired in [place]  . Financial resource strain: Not on file  . Food insecurity  Worry: Not on file    Inability: Not on file  . Transportation needs    Medical: Not on file    Non-medical: Not on file  Tobacco Use  . Smoking status: Former Smoker    Years: 24.00    Types: Pipe    Quit date: 09/24/1978    Years since quitting: 40.6  . Smokeless tobacco: Never Used  Substance and Sexual Activity  . Alcohol use: Yes    Comment: 3 drinks/day - gin or vodka   . Drug use: No  . Sexual activity: Not on file  Lifestyle  . Physical activity    Days per week: Not on file    Minutes per session: Not on file  . Stress: Not on file  Relationships  . Social Herbalist on phone: Not on file    Gets together: Not on file    Attends religious service: Not on file    Active member of club or organization: Not on file    Attends meetings of clubs or organizations: Not on file    Relationship status: Not on file  Other Topics Concern  . Not on file  Social History Narrative  . Not on file     Family History:  The patient's family history includes Pancreatic cancer in his mother.   ROS:   Please see the history of present illness.    Review of Systems  Constitution: Positive for malaise/fatigue.  HENT: Negative.   Cardiovascular: Negative.   Respiratory: Negative.   Endocrine: Negative.   Hematologic/Lymphatic: Negative.   Musculoskeletal: Positive for back pain.  Gastrointestinal: Negative.   Genitourinary: Negative.   Neurological: Positive for weakness.   All other systems reviewed  and are negative.   PHYSICAL EXAM:   VS:  BP 118/60   Pulse 72   Ht 5' 10.5" (1.791 m)   Wt 172 lb 12.8 oz (78.4 kg)   SpO2 98%   BMI 24.44 kg/m   Physical Exam  GEN: Well nourished, well developed, in no acute distress in a wheelchair Neck: no JVD, carotid bruits, or masses Cardiac:RRR; 2/6 systolic murmur at the left sternal border and 2/6 diastolic murmur at the left sternal border Respiratory:  clear to auscultation bilaterally, normal work of breathing GI: soft, nontender, nondistended, + BS Ext: without cyanosis, clubbing, or edema, Good distal pulses bilaterally Neuro:  Alert and Oriented x 3 Psych: euthymic mood, full affect  Wt Readings from Last 3 Encounters:  04/21/19 172 lb 12.8 oz (78.4 kg)  04/07/19 174 lb 6.4 oz (79.1 kg)  03/24/19 171 lb 15.3 oz (78 kg)      Studies/Labs Reviewed:   EKG:  EKG is not ordered today.    Recent Labs: 03/21/2019: ALT 25 03/23/2019: Hemoglobin 13.1; Platelets 148 03/24/2019: BUN 11; Creatinine, Ser 0.91; Magnesium 1.7; Potassium 3.6; Sodium 141   Lipid Panel    Component Value Date/Time   CHOL 139 03/22/2019 1200   TRIG 72 03/22/2019 1200   HDL 66 03/22/2019 1200   CHOLHDL 2.1 03/22/2019 1200   VLDL 14 03/22/2019 1200   LDLCALC 59 03/22/2019 1200    Additional studies/ records that were reviewed today include:    Left heart cath 6/29/20201. No angiographic evidence of CAD   Recommendations: No further ischemic workup.      2D echo 6/28/2020IMPRESSIONS      1. The left ventricle has mildly reduced systolic function, with an ejection fraction of 45-50%. The cavity size was  normal. There is moderate concentric left ventricular hypertrophy. Left ventricular diastolic Doppler parameters are consistent with  impaired relaxation. Elevated left ventricular end-diastolic pressure.  2. The right ventricle has normal systolic function. The cavity was normal. There is no increase in right ventricular wall thickness. Right  ventricular systolic pressure is moderately elevated with an estimated pressure of 42.9 mmHg.  3. Trivial pericardial effusion is present.  4. The mitral valve is degenerative. Moderate thickening of the mitral valve leaflet. Mild calcification of the mitral valve leaflet. Mitral valve regurgitation is mild to moderate by color flow Doppler.  5. The aortic valve is tricuspid. Moderate thickening of the aortic valve. Mild calcification of the aortic valve. Aortic valve regurgitation was not assessed by color flow Doppler. Mild stenosis of the aortic valve.  6. The inferior vena cava was dilated in size with >50% respiratory variability.   SUMMARY   When compared to the prior study from 01/15/2017 LVEF has decreased from 60-65% to 45-50% with hypokinesis of the basal and mid inferoseptal and inferior walls.  FINDINGS  Left Ventricle: The left ventricle has mildly reduced systolic function, with an ejection fraction of 45-50%. The cavity size was normal. There is moderate concentric left ventricular hypertrophy. Left ventricular diastolic Doppler parameters are  consistent with impaired relaxation. Elevated left ventricular end-diastolic pressure        ASSESSMENT:    1. Essential hypertension   2. Coronary artery disease involving native coronary artery of native heart without angina pectoris   3. Hyperlipidemia, unspecified hyperlipidemia type   4. OBSTRUCTIVE SLEEP APNEA      PLAN:  In order of problems listed above:   Status post NSTEMI with normal coronary arteries on cath 03/23/2019 but new LV dysfunction EF 45 to 50% with wall motion abnormalities could represent mild myocarditis.  coreg 3.125 mg BID for 3 days and i, start losartan 12.5 mg daily started last office visit.   Essential hypertension BP running low since hospitalization-now tolerating low-dose carvedilol and losartan.  If blood pressure remains stable over the next week I told him to increase his carvedilol to 6.25  mg twice daily.  Will check renal function today.  Follow-up in 4 weeks.  Also schedule follow-up with Dr. [name]   Hyperlipidemia on simvastatin    OSA on CPAP        Medication Adjustments/Labs and Tests Ordered: Current medicines are reviewed at length with the patient today.  Concerns regarding medicines are outlined above.  Medication changes, Labs and Tests ordered today are listed in the Patient Instructions below. Patient Instructions  Medication Instructions:  In one week if  blood pressure remains in normal range increase carvedilol to 6.25 mg twice a day. However if blood pressure is running 105 or below do not increase  If you need a refill on your cardiac medications before your next appointment, please call your pharmacy.   Lab work: TODAY: BMET   If you have labs (blood work) drawn today and your tests are completely normal, you will receive your results only by: Marland Kitchen MyChart Message (if you have MyChart) OR . A paper copy in the mail If you have any lab test that is abnormal or we need to change your treatment, we will call you to review the results.  Testing/Procedures: None  Follow-Up: Follow up with Ermalinda Barrios PA-C in 4 weeks   Follow up with Dr.Nelson at her first available   Any Other Special Instructions Will Be Listed Below (If Applicable).  Sumner Boast, PA-C  04/21/2019 10:12 AM    [facility] [address], [place], [place]  [address] Phone: [phone]; Fax: [phone]

## 2019-04-20 NOTE — Telephone Encounter (Signed)
New Message         COVID-19 Pre-Screening Questions:   In the past 7 to 10 days have you had a cough,  shortness of breath, headache, congestion, fever (100 or greater) body aches, chills, sore throat, or sudden loss of taste or sense of smell? NO  Have you been around anyone with known Covid 19. NO  Have you been around anyone who is awaiting Covid 19 test results in the past 7 to 10 days? NO  Have you been around anyone who has been exposed to Covid 19, or has mentioned symptoms of Covid 19 within the past 7 to 10 days? NO Pt says his wife will be assisting him because he is in a wheel chair, she answered NO to all screening questions    If you have any concerns/questions about symptoms patients report during screening (either on the phone or at threshold). Contact the provider seeing the patient or DOD for further guidance.  If neither are available contact a member of the leadership team.

## 2019-04-21 ENCOUNTER — Encounter: Payer: Self-pay | Admitting: Physician Assistant

## 2019-04-21 ENCOUNTER — Ambulatory Visit (INDEPENDENT_AMBULATORY_CARE_PROVIDER_SITE_OTHER): Payer: Medicare Other | Admitting: Physician Assistant

## 2019-04-21 ENCOUNTER — Other Ambulatory Visit: Payer: Self-pay

## 2019-04-21 VITALS — BP 118/60 | HR 72 | Ht 70.5 in | Wt 172.8 lb

## 2019-04-21 DIAGNOSIS — I10 Essential (primary) hypertension: Secondary | ICD-10-CM | POA: Diagnosis not present

## 2019-04-21 DIAGNOSIS — I2510 Atherosclerotic heart disease of native coronary artery without angina pectoris: Secondary | ICD-10-CM | POA: Diagnosis not present

## 2019-04-21 DIAGNOSIS — G4733 Obstructive sleep apnea (adult) (pediatric): Secondary | ICD-10-CM

## 2019-04-21 DIAGNOSIS — E785 Hyperlipidemia, unspecified: Secondary | ICD-10-CM | POA: Diagnosis not present

## 2019-04-21 LAB — BASIC METABOLIC PANEL
BUN/Creatinine Ratio: 24 (ref 10–24)
BUN: 24 mg/dL (ref 8–27)
CO2: 28 mmol/L (ref 20–29)
Calcium: 9.7 mg/dL (ref 8.6–10.2)
Chloride: 102 mmol/L (ref 96–106)
Creatinine, Ser: 0.98 mg/dL (ref 0.76–1.27)
GFR calc Af Amer: 82 mL/min/{1.73_m2} (ref >59)
GFR calc non Af Amer: 71 mL/min/{1.73_m2} (ref >59)
Glucose: 99 mg/dL (ref 65–99)
Potassium: 4.4 mmol/L (ref 3.5–5.2)
Sodium: 144 mmol/L (ref 134–144)

## 2019-04-21 NOTE — Patient Instructions (Addendum)
Medication Instructions:  In one week if  blood pressure remains in normal range increase carvedilol to 6.25 mg twice a day. However if blood pressure is running 105 or below do not increase  If you need a refill on your cardiac medications before your next appointment, please call your pharmacy.   Lab work: TODAY: BMET   If you have labs (blood work) drawn today and your tests are completely normal, you will receive your results only by: Marland Kitchen MyChart Message (if you have MyChart) OR . A paper copy in the mail If you have any lab test that is abnormal or we need to change your treatment, we will call you to review the results.  Testing/Procedures: None  Follow-Up: Follow up with Ermalinda Barrios PA-C in 4 weeks   Follow up with Dr.Nelson at her first available   Any Other Special Instructions Will Be Listed Below (If Applicable).

## 2019-05-06 ENCOUNTER — Telehealth: Payer: Self-pay | Admitting: Physician Assistant

## 2019-05-06 NOTE — Telephone Encounter (Signed)
I had asked the patient to increase his carvedilol to 6.25 mg twice daily if his blood pressure tolerated that.  If he actually increased his carvedilol ask him to reduce it to 3.125 mg twice daily.  He is only on low-dose losartan 12.5 mg daily as well.  He was having trouble staying hydrated when I saw him.  Can you ask if he is staying hydrated?  Also ask if he is symptomatic with the low blood pressures after he takes his meds?

## 2019-05-06 NOTE — Telephone Encounter (Signed)
Received call from [place] who called to report patient's BP readings: She has no HR readings to report  Today per patient with his cuff: 127/69 before meds 86/48 1 hour after meds   HHN's readings: 112/70 sitting 120/72 standing  Yesterday per patient with his cuff: 134/78 before meds 80/46 1.5 hours after meds  [name] would like parameters for patient's BP and would like to know if patient should continue current medications. I advised that I will forward to Ermalinda Barrios, PA for guidance and will call her back with [name]'s advice. She thanked me for the call and advised that a voice mail may be left for her if she does not answer. She thanked me for assistance.

## 2019-05-06 NOTE — Telephone Encounter (Signed)
Reviewed parameters with [name], HHN She states patient gets up about 3 times per night and she is concerned about patient falling if BP is low from taking losartan at night She is going to ask patient to try taking losartan 12.5 mg at lunch [name] thanked me for the call

## 2019-05-06 NOTE — Telephone Encounter (Signed)
New Message:      [name] from [place] called with pt's blood pressure readings:

## 2019-05-06 NOTE — Telephone Encounter (Signed)
It sounds like he is managing his low blood pressures pretty well.  He definitely needs to increase his hydration as I felt he was dehydrated when I saw him.  If his blood pressure is below 90 systolic he can hold his dose of carvedilol.  He may want to try taking the losartan at night to see if this helps.  Thank you

## 2019-05-06 NOTE — Telephone Encounter (Signed)
Called patient who states he is taking carvedilol 3.125 mg twice daily and losartan 12.5 mg daily He states when he checks his BP and it is low he remains seated so he does not know if he is dizzy or light-headed. He states he expects to have the low reading about the same time daily so he either lies down or stays seated Drinks about 25 oz water daily but is working to increase water intake I thanked him for the information and advised that we will be back in touch with his HHN, [name]. He thanked me for calling.

## 2019-05-19 DIAGNOSIS — I514 Myocarditis, unspecified: Secondary | ICD-10-CM | POA: Insufficient documentation

## 2019-05-19 NOTE — Progress Notes (Signed)
Cardiology Office Note    Date:  05/20/2019   ID:  Faron, Estepa 1933-07-12, MRN [id number]  PCP:  Hulan Fess, MD  Cardiologist: Ena Dawley, MD EPS: None  No chief complaint on file.   History of Present Illness:  AYHAM PEPE is a 85 y.o. male with history of hypertension, HLD, chronic LBBB, OSA on CPAP.  Patient was admitted with chest pain and confusion.  Troponin elevated to 2263.  EKG with chronic left bundle branch block.  Cardiac catheterization 03/23/2019 no evidence of CAD.  2D echo LVEF 45 to 50% with moderate concentric LVH and impaired relaxation.  When compared to echo in 2018 decrease in LV function with hypokinesis of the basal and mid inferior septal and inferior walls.  Was felt he could potentially could have had a mild myocarditis with new LV dysfunction.  Losartan 25 mg was added.  Encephalopathy resolved.    I restarted Coreg 3.125 mg twice daily and losartan 12.5 mg daily 04/07/2019.  I saw him again 04/21/2019 at which time I told him to increase his carvedilol to 6.25 mg twice daily.    Patient called in because His blood pressures did drop some.  Patient brings a list of his blood pressures and and drops [id number] points systolic 30 minutes after taking the carvedilol.  He is only been taking 3.125 mg twice daily.  He gets very dizzy and has to hold onto the walls.  He only drinks 25 ounces of water daily in addition to 2 cups of coffee.    Past Medical History:  Diagnosis Date  . Arthritis    Degeneration spine & stenosis  . History of kidney stones   . Hypertension    has a histroy of  . Hypotension   . Sleep apnea 2012   used CPAP 2 yrs. ago, feels he sleeps better w/o, no longer using     Past Surgical History:  Procedure Laterality Date  . ARM NEUROPLASTY     at 14 yrs. of age- fell off tractor- had fracture & repair *& later- 47's had  transplantation of a nerve at the elbow  . BACK SURGERY     x4 back surgery x2 fusion -  . CARPAL  TUNNEL RELEASE Right   . COLONOSCOPY    . ESOPHAGOGASTRODUODENOSCOPY (EGD) WITH PROPOFOL N/A 09/23/2017   Procedure: ESOPHAGOGASTRODUODENOSCOPY (EGD) WITH PROPOFOL;  Surgeon: Laurence Spates, MD;  Location: [facility];  Service: Endoscopy;  Laterality: N/A;  . LEFT HEART CATH AND CORONARY ANGIOGRAPHY N/A 03/23/2019   Procedure: LEFT HEART CATH AND CORONARY ANGIOGRAPHY;  Surgeon: Burnell Blanks, MD;  Location: [facility];  Service: Cardiovascular;  Laterality: N/A;  . PLEURAL EFFUSION DRAINAGE Left 09/09/2017   Procedure: DRAINAGE OF PLEURAL EFFUSION;  Surgeon: [name], MD;  Location: [facility];  Service: Thoracic;  Laterality: Left;  . POSTERIOR CERVICAL FUSION/FORAMINOTOMY N/A 06/24/2014   Procedure: Posterior Cervical Three-Seven Fusion with Lateral Mass Fixation;  Surgeon: Erline Levine, MD;  Location: [facility];  Service: Neurosurgery;  Laterality: N/A;  C3-C7 posterior cervical fusion with lateral mass fixation  . SHOULDER SURGERY Bilateral   . VIDEO ASSISTED THORACOSCOPY (VATS)/DECORTICATION Left 09/09/2017   Procedure: VIDEO ASSISTED THORACOSCOPY (VATS)/DECORTICATION;  Surgeon: [name], MD;  Location: [facility];  Service: Thoracic;  Laterality: Left;  Marland Kitchen VIDEO BRONCHOSCOPY  09/09/2017   Procedure: VIDEO BRONCHOSCOPY;  Surgeon: [name], MD;  Location: [facility];  Service: Thoracic;;  Current Medications: Current Meds  Medication Sig  . aspirin EC 81 MG tablet Take 81 mg by mouth daily.  . Biotin 10000 MCG TABS Take 1 tablet by mouth daily.  . Cholecalciferol (VITAMIN D) 2000 UNITS CAPS Take 2,000 Units by mouth daily.   . Ferrous Sulfate (IRON) 325 (65 Fe) MG TABS Take 1 tablet by mouth every other day.   . finasteride (PROSCAR) 5 MG tablet Take 5 mg by mouth daily.  Marland Kitchen HYDROmorphone (DILAUDID) 2 MG tablet Take 2 mg by mouth 4 (four) times daily as needed for severe pain.  Marland Kitchen L-Lysine 500 MG CAPS Take 500 mg by mouth daily.   Marland Kitchen losartan  (COZAAR) 25 MG tablet Take 0.5 tablets (12.5 mg total) by mouth daily.  . mineral oil liquid Take 15 mLs by mouth at bedtime.   . Multiple Vitamin (MULTIVITAMIN WITH MINERALS) TABS tablet Take 1 tablet by mouth daily. Mature mens 23+  . NIACIN CR PO Take 25 mg by mouth daily.  . nortriptyline (PAMELOR) 25 MG capsule Take 25 mg by mouth at bedtime.   Marland Kitchen OVER THE COUNTER MEDICATION Take 6 tablets by mouth daily. Perdiem 15 mg  . PANTOTHENIC ACID PO Take 5.5 mg by mouth daily.  . polyethylene glycol (MIRALAX / GLYCOLAX) packet Take 17 g by mouth daily.  . pregabalin (LYRICA) 150 MG capsule Take 150 mg by mouth 2 (two) times daily.  . saw palmetto 80 MG capsule Take 240 mg by mouth daily.   Marland Kitchen senna (SENOKOT) 8.6 MG TABS tablet Take 1 tablet (8.6 mg total) by mouth 2 (two) times a day.  . simvastatin (ZOCOR) 40 MG tablet Take 40 mg by mouth at bedtime.   . SODIUM CHLORIDE PO Take by mouth daily. 10 mg  . tiZANidine (ZANAFLEX) 2 MG tablet Take 2 mg by mouth 3 (three) times daily.   . vitamin B-12 (CYANOCOBALAMIN) 100 MCG tablet Take 100 mcg by mouth daily.  . vitamin C (ASCORBIC ACID) 500 MG tablet Take 500 mg by mouth daily.  . [DISCONTINUED] carvedilol (COREG) 6.25 MG tablet Take 3.125 mg by mouth 2 (two) times daily with a meal.     Allergies:   Diazepam   Social History   Socioeconomic History  . Marital status: Married    Spouse name: Not on file  . Number of children: Not on file  . Years of education: Not on file  . Highest education level: Not on file  Occupational History  . Occupation: Retired    Comment: Worked for [employer] and retired in [place]  . Financial resource strain: Not on file  . Food insecurity    Worry: Not on file    Inability: Not on file  . Transportation needs    Medical: Not on file    Non-medical: Not on file  Tobacco Use  . Smoking status: Former Smoker    Years: 24.00    Types: Pipe    Quit date: 09/24/1978    Years since quitting: 40.6  .  Smokeless tobacco: Never Used  Substance and Sexual Activity  . Alcohol use: Yes    Comment: 3 drinks/day - gin or vodka   . Drug use: No  . Sexual activity: Not on file  Lifestyle  . Physical activity    Days per week: Not on file    Minutes per session: Not on file  . Stress: Not on file  Relationships  . Social connections    Talks  on phone: Not on file    Gets together: Not on file    Attends religious service: Not on file    Active member of club or organization: Not on file    Attends meetings of clubs or organizations: Not on file    Relationship status: Not on file  Other Topics Concern  . Not on file  Social History Narrative  . Not on file     Family History:  The patient's   family history includes Pancreatic cancer in his mother.   ROS:   Please see the history of present illness.    ROS All other systems reviewed and are negative.   PHYSICAL EXAM:   VS:  BP (!) 114/56   Pulse 70   Ht 5' 10.5" (1.791 m)   Wt 179 lb 6.4 oz (81.4 kg)   SpO2 97%   BMI 25.38 kg/m   Physical Exam  GEN: Well nourished, well developed, in no acute distress  Neck: no JVD, carotid bruits, or masses Cardiac:RRR; 2/6 systolic murmur at the apex, 2/6 systolic murmur at the left sternal border Respiratory:  clear to auscultation bilaterally, normal work of breathing GI: soft, nontender, nondistended, + BS Ext: without cyanosis, clubbing, or edema, Good distal pulses bilaterally Neuro:  Alert and Oriented x 3 Psych: euthymic mood, full affect  Wt Readings from Last 3 Encounters:  05/20/19 179 lb 6.4 oz (81.4 kg)  04/21/19 172 lb 12.8 oz (78.4 kg)  04/07/19 174 lb 6.4 oz (79.1 kg)      Studies/Labs Reviewed:   EKG:  EKG is not ordered today.    Recent Labs: 03/21/2019: ALT 25 03/23/2019: Hemoglobin 13.1; Platelets 148 03/24/2019: Magnesium 1.7 04/21/2019: BUN 24; Creatinine, Ser 0.98; Potassium 4.4; Sodium 144   Lipid Panel    Component Value Date/Time   CHOL 139  03/22/2019 1200   TRIG 72 03/22/2019 1200   HDL 66 03/22/2019 1200   CHOLHDL 2.1 03/22/2019 1200   VLDL 14 03/22/2019 1200   LDLCALC 59 03/22/2019 1200    Additional studies/ records that were reviewed today include:    Left heart cath 6/29/20201. No angiographic evidence of CAD   Recommendations: No further ischemic workup.      2D echo 6/28/2020IMPRESSIONS      1. The left ventricle has mildly reduced systolic function, with an ejection fraction of 45-50%. The cavity size was normal. There is moderate concentric left ventricular hypertrophy. Left ventricular diastolic Doppler parameters are consistent with  impaired relaxation. Elevated left ventricular end-diastolic pressure.  2. The right ventricle has normal systolic function. The cavity was normal. There is no increase in right ventricular wall thickness. Right ventricular systolic pressure is moderately elevated with an estimated pressure of 42.9 mmHg.  3. Trivial pericardial effusion is present.  4. The mitral valve is degenerative. Moderate thickening of the mitral valve leaflet. Mild calcification of the mitral valve leaflet. Mitral valve regurgitation is mild to moderate by color flow Doppler.  5. The aortic valve is tricuspid. Moderate thickening of the aortic valve. Mild calcification of the aortic valve. Aortic valve regurgitation was not assessed by color flow Doppler. Mild stenosis of the aortic valve.  6. The inferior vena cava was dilated in size with >50% respiratory variability.   SUMMARY   When compared to the prior study from 01/15/2017 LVEF has decreased from 60-65% to 45-50% with hypokinesis of the basal and mid inferoseptal and inferior walls.  FINDINGS  Left Ventricle: The left ventricle has mildly  reduced systolic function, with an ejection fraction of 45-50%. The cavity size was normal. There is moderate concentric left ventricular hypertrophy. Left ventricular diastolic Doppler parameters are  consistent  with impaired relaxation. Elevated left ventricular end-diastolic pressure        ASSESSMENT:    1. Viral myocarditis, unspecified chronicity   2. Essential hypertension   3. Mixed hyperlipidemia   4. OBSTRUCTIVE SLEEP APNEA   5. Mitral valve insufficiency, unspecified etiology   6. Aortic valve stenosis, etiology of cardiac valve disease unspecified      PLAN:  In order of problems listed above:   status post NSTEMI with normal coronary arteries on cath 03/23/2019 but new LV dysfunction ejection fraction 45 to 50% with wall motion abnormalities that could represent mild myocarditis.  Patient has had significant drop in blood pressure 30 minutes after taking the carvedilol associated with dizziness.  He is only taken 3.125 twice daily.  Will stop this completely.  Continue losartan 12.5 mg daily.  Increase hydration .he will continue to monitor his blood pressures.  Follow-up with me next month, 2D echo in October and Dr. [name] in November.  Essential hypertension blood pressure has been running low see above  Hyperlipidemia on Lipitor  OSA on CPAP   Mild to mod MR, mild AS  Medication Adjustments/Labs and Tests Ordered: Current medicines are reviewed at length with the patient today.  Concerns regarding medicines are outlined above.  Medication changes, Labs and Tests ordered today are listed in the Patient Instructions below. Patient Instructions  Medication Instructions:  Your physician has recommended you make the following change in your medication: STOP CARVEDILOL/COREG  If you need a refill on your cardiac medications before your next appointment, please call your pharmacy.   Lab work: Your physician recommends that you return for lab work TODAY:  BMET  If you have labs (blood work) drawn today and your tests are completely normal, you will receive your results only by: Marland Kitchen MyChart Message (if you have MyChart) OR . A paper copy in the mail If you have any lab test  that is abnormal or we need to change your treatment, we will call you to review the results.  Testing/Procedures: Your physician has requested that you have an echocardiogram. Echocardiography is a painless test that uses sound waves to create images of your heart. It provides your doctor with information about the size and shape of your heart and how well your heart's chambers and valves are working. This procedure takes approximately one hour. There are no restrictions for this procedure.    Follow-Up: At [facility], you and your health needs are our priority.  As part of our continuing mission to provide you with exceptional heart care, we have created designated Provider Care Teams.  These Care Teams include your primary Cardiologist (physician) and Advanced Practice Providers (APPs -  Physician Assistants and Nurse Practitioners) who all work together to provide you with the care you need, when you need it.  [place], NP SEPT. 29, 2020 at 1:30 at [place]  Any Other Special Instructions Will Be Listed Below (If Applicable).       Sumner Boast, PA-C  05/20/2019 10:17 AM    [facility] [place], [place],   [address] Phone: [phone]; Fax: [phone]

## 2019-05-20 ENCOUNTER — Ambulatory Visit (INDEPENDENT_AMBULATORY_CARE_PROVIDER_SITE_OTHER): Payer: Medicare Other | Admitting: Physician Assistant

## 2019-05-20 ENCOUNTER — Encounter: Payer: Self-pay | Admitting: Physician Assistant

## 2019-05-20 ENCOUNTER — Other Ambulatory Visit: Payer: Self-pay

## 2019-05-20 VITALS — BP 114/56 | HR 70 | Ht 70.5 in | Wt 179.4 lb

## 2019-05-20 DIAGNOSIS — E782 Mixed hyperlipidemia: Secondary | ICD-10-CM

## 2019-05-20 DIAGNOSIS — B3322 Viral myocarditis: Secondary | ICD-10-CM | POA: Diagnosis not present

## 2019-05-20 DIAGNOSIS — I10 Essential (primary) hypertension: Secondary | ICD-10-CM

## 2019-05-20 DIAGNOSIS — I340 Nonrheumatic mitral (valve) insufficiency: Secondary | ICD-10-CM

## 2019-05-20 DIAGNOSIS — G4733 Obstructive sleep apnea (adult) (pediatric): Secondary | ICD-10-CM

## 2019-05-20 DIAGNOSIS — I350 Nonrheumatic aortic (valve) stenosis: Secondary | ICD-10-CM

## 2019-05-20 LAB — BASIC METABOLIC PANEL
BUN/Creatinine Ratio: 17 (ref 10–24)
BUN: 17 mg/dL (ref 8–27)
CO2: 28 mmol/L (ref 20–29)
Calcium: 9.5 mg/dL (ref 8.6–10.2)
Chloride: 102 mmol/L (ref 96–106)
Creatinine, Ser: 1.03 mg/dL (ref 0.76–1.27)
GFR calc Af Amer: 77 mL/min/{1.73_m2} (ref >59)
GFR calc non Af Amer: 66 mL/min/{1.73_m2} (ref >59)
Glucose: 98 mg/dL (ref 65–99)
Potassium: 4.3 mmol/L (ref 3.5–5.2)
Sodium: 143 mmol/L (ref 134–144)

## 2019-05-20 NOTE — Patient Instructions (Signed)
Medication Instructions:  Your physician has recommended you make the following change in your medication: STOP CARVEDILOL/COREG  If you need a refill on your cardiac medications before your next appointment, please call your pharmacy.   Lab work: Your physician recommends that you return for lab work TODAY:  BMET  If you have labs (blood work) drawn today and your tests are completely normal, you will receive your results only by: Marland Kitchen MyChart Message (if you have MyChart) OR . A paper copy in the mail If you have any lab test that is abnormal or we need to change your treatment, we will call you to review the results.  Testing/Procedures: Your physician has requested that you have an echocardiogram. Echocardiography is a painless test that uses sound waves to create images of your heart. It provides your doctor with information about the size and shape of your heart and how well your heart's chambers and valves are working. This procedure takes approximately one hour. There are no restrictions for this procedure.    Follow-Up: At [facility], you and your health needs are our priority.  As part of our continuing mission to provide you with exceptional heart care, we have created designated Provider Care Teams.  These Care Teams include your primary Cardiologist (physician) and Advanced Practice Providers (APPs -  Physician Assistants and Nurse Practitioners) who all work together to provide you with the care you need, when you need it.  [place], NP SEPT. 29, 2020 at 1:30 at [place]  Any Other Special Instructions Will Be Listed Below (If Applicable).

## 2019-05-28 ENCOUNTER — Ambulatory Visit (HOSPITAL_COMMUNITY): Payer: Medicare Other | Attending: Internal Medicine

## 2019-05-28 ENCOUNTER — Other Ambulatory Visit: Payer: Self-pay

## 2019-05-28 DIAGNOSIS — B3322 Viral myocarditis: Secondary | ICD-10-CM | POA: Insufficient documentation

## 2019-05-29 ENCOUNTER — Telehealth: Payer: Self-pay

## 2019-05-29 NOTE — Telephone Encounter (Signed)
Phoned and spoke with patient's wife Aaron Schmidt (per DPR),  informed of echo results. States Pilar' BP's have been in the 130's/70's with HR's in the 80's. States he feels better since stopping carvedilol has had no more dizzy episodes.

## 2019-06-23 ENCOUNTER — Encounter: Payer: Self-pay | Admitting: Physician Assistant

## 2019-06-23 ENCOUNTER — Other Ambulatory Visit: Payer: Self-pay

## 2019-06-23 ENCOUNTER — Ambulatory Visit (INDEPENDENT_AMBULATORY_CARE_PROVIDER_SITE_OTHER): Payer: Medicare Other | Admitting: Physician Assistant

## 2019-06-23 VITALS — BP 142/88 | HR 76 | Ht 70.5 in | Wt 177.1 lb

## 2019-06-23 DIAGNOSIS — I340 Nonrheumatic mitral (valve) insufficiency: Secondary | ICD-10-CM

## 2019-06-23 DIAGNOSIS — E785 Hyperlipidemia, unspecified: Secondary | ICD-10-CM

## 2019-06-23 DIAGNOSIS — I952 Hypotension due to drugs: Secondary | ICD-10-CM

## 2019-06-23 DIAGNOSIS — I2510 Atherosclerotic heart disease of native coronary artery without angina pectoris: Secondary | ICD-10-CM

## 2019-06-23 DIAGNOSIS — G4733 Obstructive sleep apnea (adult) (pediatric): Secondary | ICD-10-CM

## 2019-06-23 DIAGNOSIS — Z9989 Dependence on other enabling machines and devices: Secondary | ICD-10-CM

## 2019-06-23 DIAGNOSIS — I959 Hypotension, unspecified: Secondary | ICD-10-CM | POA: Insufficient documentation

## 2019-06-23 MED ORDER — LOSARTAN POTASSIUM 25 MG PO TABS: 25.0000 mg | ORAL_TABLET | Freq: Every day | ORAL | 3 refills | Status: DC

## 2019-06-23 NOTE — Patient Instructions (Addendum)
Medication Instructions:  Your physician has recommended you make the following change in your medication:   1. INCREASE: losartan (cozaar) 25 mg tablet: Take 1 tablet by mouth once a day  2. DO NOT take carvedilol (coreg)   Lab work: Your physician recommends that you return for a lab work (BMET) on 06/30/19   If you have labs (blood work) drawn today and your tests are completely normal, you will receive your results only by: Marland Kitchen MyChart Message (if you have MyChart) OR . A paper copy in the mail If you have any lab test that is abnormal or we need to change your treatment, we will call you to review the results.  Testing/Procedures: None ordered  Follow-Up: . Follow up with Ermalinda Barrios, PA via VIDEO VISIT on 07/07/19 at 12:00 PM  Any Other Special Instructions Will Be Listed Below (If Applicable).

## 2019-06-23 NOTE — Progress Notes (Signed)
Cardiology Office Note    Date:  06/23/2019   ID:  Aaron Schmidt, DOB 1932-12-07, MRN [id number]  PCP:  Hulan Fess, MD  Cardiologist: Ena Dawley, MD EPS: None  No chief complaint on file.   History of Present Illness:  Aaron Schmidt is a 85 y.o. male with history of hypertension, HLD, chronic LBBB, OSA on CPAP.  Patient was admitted with chest pain and confusion.  Troponin elevated to 2263.  EKG with chronic left bundle branch block.  Cardiac catheterization 03/23/2019 no evidence of CAD.  2D echo LVEF 45 to 50% with moderate concentric LVH and impaired relaxation.  When compared to echo in 2018 decrease in LV function with hypokinesis of the basal and mid inferior septal and inferior walls.  Was felt he could potentially could have had a mild myocarditis with new LV dysfunction.  Losartan 25 mg was added.  Encephalopathy resolved.    I restarted Coreg 3.125 mg twice daily and losartan 12.5 mg daily 04/07/2019.  I saw him again 04/21/2019 at which time I told him to increase his carvedilol to 6.25 mg twice daily.    LOV 05/20/19 he was having significant dizziness and drops in BP. I stopped carvedilol completely and increased hydration.   Patient comes in today for f/u. Having a lot of back pain. BP has been running higher so he started coreg 3.125 mg bid but also has low BP's after he takes it. Staying hydrated. Brings list of BP's and running 140-150's. F/U echo 05/28/19 normal LVEF 123456 with diastolic dysfunction, mod MR.    Past Medical History:  Diagnosis Date   Arthritis    Degeneration spine & stenosis   History of kidney stones    Hypertension    has a histroy of   Hypotension    Sleep apnea 2012   used CPAP 2 yrs. ago, feels he sleeps better w/o, no longer using     Past Surgical History:  Procedure Laterality Date   ARM NEUROPLASTY     at 14 yrs. of age- fell off tractor- had fracture & repair *& later- 1990's had  transplantation of a nerve at the elbow    BACK SURGERY     x4 back surgery x2 fusion -   CARPAL TUNNEL RELEASE Right    COLONOSCOPY     ESOPHAGOGASTRODUODENOSCOPY (EGD) WITH PROPOFOL N/A 09/23/2017   Procedure: ESOPHAGOGASTRODUODENOSCOPY (EGD) WITH PROPOFOL;  Surgeon: Laurence Spates, MD;  Location: [facility];  Service: Endoscopy;  Laterality: N/A;   LEFT HEART CATH AND CORONARY ANGIOGRAPHY N/A 03/23/2019   Procedure: LEFT HEART CATH AND CORONARY ANGIOGRAPHY;  Surgeon: Burnell Blanks, MD;  Location: [facility];  Service: Cardiovascular;  Laterality: N/A;   PLEURAL EFFUSION DRAINAGE Left 09/09/2017   Procedure: DRAINAGE OF PLEURAL EFFUSION;  Surgeon: [name], MD;  Location: [facility];  Service: Thoracic;  Laterality: Left;   POSTERIOR CERVICAL FUSION/FORAMINOTOMY N/A 06/24/2014   Procedure: Posterior Cervical Three-Seven Fusion with Lateral Mass Fixation;  Surgeon: Erline Levine, MD;  Location: [facility];  Service: Neurosurgery;  Laterality: N/A;  C3-C7 posterior cervical fusion with lateral mass fixation   SHOULDER SURGERY Bilateral    VIDEO ASSISTED THORACOSCOPY (VATS)/DECORTICATION Left 09/09/2017   Procedure: VIDEO ASSISTED THORACOSCOPY (VATS)/DECORTICATION;  Surgeon: [name], MD;  Location: [facility];  Service: Thoracic;  Laterality: Left;   VIDEO BRONCHOSCOPY  09/09/2017   Procedure: VIDEO BRONCHOSCOPY;  Surgeon: [name], MD;  Location: [facility];  Service:  Thoracic;;    Current Medications: Current Meds  Medication Sig   aspirin EC 81 MG tablet Take 81 mg by mouth daily.   Cholecalciferol (VITAMIN D) 2000 UNITS CAPS Take 2,000 Units by mouth daily.    Ferrous Sulfate (IRON) 325 (65 Fe) MG TABS Take 1 tablet by mouth every other day.    finasteride (PROSCAR) 5 MG tablet Take 5 mg by mouth daily.   HYDROmorphone (DILAUDID) 2 MG tablet Take 2 mg by mouth 4 (four) times daily as needed for severe pain.   L-Lysine 500 MG CAPS Take 500 mg by mouth daily.     mineral oil liquid Take 15 mLs by mouth at bedtime.    Multiple Vitamin (MULTIVITAMIN WITH MINERALS) TABS tablet Take 1 tablet by mouth daily. Mature mens 50+   nortriptyline (PAMELOR) 25 MG capsule Take 25 mg by mouth at bedtime.    OVER THE COUNTER MEDICATION Take 6 tablets by mouth daily. Perdiem 15 mg   polyethylene glycol (MIRALAX / GLYCOLAX) packet Take 17 g by mouth daily.   pregabalin (LYRICA) 150 MG capsule Take 150 mg by mouth 2 (two) times daily.   saw palmetto 80 MG capsule Take 240 mg by mouth daily.    senna (SENOKOT) 8.6 MG TABS tablet Take 1 tablet (8.6 mg total) by mouth 2 (two) times a day.   simvastatin (ZOCOR) 40 MG tablet Take 40 mg by mouth at bedtime.    tiZANidine (ZANAFLEX) 2 MG tablet Take 2 mg by mouth 3 (three) times daily.    vitamin B-12 (CYANOCOBALAMIN) 100 MCG tablet Take 100 mcg by mouth daily.   vitamin C (ASCORBIC ACID) 500 MG tablet Take 500 mg by mouth daily.   [DISCONTINUED] losartan (COZAAR) 25 MG tablet Take 0.5 tablets (12.5 mg total) by mouth daily.     Allergies:   Diazepam   Social History   Socioeconomic History   Marital status: Married    Spouse name: Not on file   Number of children: Not on file   Years of education: Not on file   Highest education level: Not on file  Occupational History   Occupation: Retired    Comment: Worked for [employer] and retired in [place]: Not on file   Food insecurity    Worry: Not on file    Inability: Not on Lexicographer needs    Medical: Not on file    Non-medical: Not on file  Tobacco Use   Smoking status: Former Smoker    Years: 24.00    Types: Pipe    Quit date: 09/24/1978    Years since quitting: 40.7   Smokeless tobacco: Never Used  Substance and Sexual Activity   Alcohol use: Yes    Comment: 3 drinks/day - gin or vodka    Drug use: No   Sexual activity: Not on file  Lifestyle   Physical activity    Days per week: Not  on file    Minutes per session: Not on file   Stress: Not on file  Relationships   Social connections    Talks on phone: Not on file    Gets together: Not on file    Attends religious service: Not on file    Active member of club or organization: Not on file    Attends meetings of clubs or organizations: Not on file    Relationship status: Not on file  Other Topics Concern  Not on file  Social History Narrative   Not on file     Family History:  The patient's   family history includes Pancreatic cancer in his mother.   ROS:   Please see the history of present illness.    ROS All other systems reviewed and are negative.   PHYSICAL EXAM:   VS:  BP (!) 142/88    Pulse 76    Ht 5' 10.5" (1.791 m)    Wt 177 lb 1.9 oz (80.3 kg)    SpO2 94%    BMI 25.05 kg/m   Physical Exam  GEN: Well nourished, well developed, in no acute distress  Neck: no JVD, carotid bruits, or masses [id number] systolic murmur apex Respiratory:  clear to auscultation bilaterally, normal work of breathing GI: soft, nontender, nondistended, + BS Ext: without cyanosis, clubbing, or edema, Good distal pulses bilaterally Neuro:  Alert and Oriented x 3 Psych: euthymic mood, full affect  Wt Readings from Last 3 Encounters:  06/23/19 177 lb 1.9 oz (80.3 kg)  05/20/19 179 lb 6.4 oz (81.4 kg)  04/21/19 172 lb 12.8 oz (78.4 kg)      Studies/Labs Reviewed:   EKG:  EKG is not ordered today.    Recent Labs: 03/21/2019: ALT 25 03/23/2019: Hemoglobin 13.1; Platelets 148 03/24/2019: Magnesium 1.7 05/20/2019: BUN 17; Creatinine, Ser 1.03; Potassium 4.3; Sodium 143   Lipid Panel    Component Value Date/Time   CHOL 139 03/22/2019 1200   TRIG 72 03/22/2019 1200   HDL 66 03/22/2019 1200   CHOLHDL 2.1 03/22/2019 1200   VLDL 14 03/22/2019 1200   LDLCALC 59 03/22/2019 1200    Additional studies/ records that were reviewed today include:  Limited echo 05/28/19 IMPRESSIONS      1. The left ventricle has  normal systolic function, with an ejection fraction of 60-65%. The cavity size was normal. Left ventricular diastolic Doppler parameters are consistent with impaired relaxation. Indeterminate filling pressures.  2. Moderate mitral valve prolapse.  3. The mitral valve is abnormal. Moderate thickening of the mitral valve leaflet. Mild calcification of the mitral valve leaflet. Mitral valve regurgitation is moderate by color flow Doppler. The MR jet is anteriorly-directed.  4. When compared to the prior study: 03/22/19 EF 45-50%. PA pressure 36mmHg, mild to moderate MR.   SUMMARY   Limited for LV function. LVEF 60-65%, there is mitral valve thickening and calcification with late systolic prolapse of the posterior mitral leaflet and moderate anteriorly directed mitral regurgitation.  FINDINGS  Left Ventricle: The left ventricle has normal systolic function, with an ejection fraction of 60-65%. The cavity size was normal. Left ventricular diastolic Doppler parameters are consistent with impaired relaxation (grade I). Indeterminate filling  pressures.     Left Atrium: Left atrial size was normal in size.   Mitral Valve: The mitral valve is abnormal. Moderate thickening of the mitral valve leaflet. Mild calcification of the mitral valve leaflet. Mitral valve regurgitation is moderate by color flow Doppler. The MR jet is anteriorly-directed. There is  moderate late systolic prolapse of of the posterior mitral leaflet of the mitral valve.   Compared to previous exam: 03/22/19 EF 45-50%. PA pressure 86mmHg, mild to moderate MR.       Left heart cath 6/29/20201. No angiographic evidence of CAD   Recommendations: No further ischemic workup.      2D echo 6/28/2020IMPRESSIONS      1. The left ventricle has mildly reduced systolic function, with an ejection fraction of 45-50%.  The cavity size was normal. There is moderate concentric left ventricular hypertrophy. Left ventricular diastolic Doppler  parameters are consistent with  impaired relaxation. Elevated left ventricular end-diastolic pressure.  2. The right ventricle has normal systolic function. The cavity was normal. There is no increase in right ventricular wall thickness. Right ventricular systolic pressure is moderately elevated with an estimated pressure of 42.9 mmHg.  3. Trivial pericardial effusion is present.  4. The mitral valve is degenerative. Moderate thickening of the mitral valve leaflet. Mild calcification of the mitral valve leaflet. Mitral valve regurgitation is mild to moderate by color flow Doppler.  5. The aortic valve is tricuspid. Moderate thickening of the aortic valve. Mild calcification of the aortic valve. Aortic valve regurgitation was not assessed by color flow Doppler. Mild stenosis of the aortic valve.  6. The inferior vena cava was dilated in size with >50% respiratory variability.   SUMMARY   When compared to the prior study from 01/15/2017 LVEF has decreased from 60-65% to 45-50% with hypokinesis of the basal and mid inferoseptal and inferior walls.  FINDINGS  Left Ventricle: The left ventricle has mildly reduced systolic function, with an ejection fraction of 45-50%. The cavity size was normal. There is moderate concentric left ventricular hypertrophy. Left ventricular diastolic Doppler parameters are  consistent with impaired relaxation. Elevated left ventricular end-diastolic pressure           ASSESSMENT:    1. Coronary artery disease involving native coronary artery of native heart without angina pectoris   2. Hypotension due to drugs   3. Hyperlipidemia, unspecified hyperlipidemia type   4. OSA on CPAP   5. Mitral valve insufficiency, unspecified etiology      PLAN:  In order of problems listed above:   CAD S/P NSTEMI with normal coronary arteries on cath 03/23/2019 but new LV dysfunction ejection fraction 45 to 50% with wall motion abnormalities that could represent mild  myocarditis.  Repeat echo 05/28/19 normal LVEF 123456 with diastolic dysfunction and moderate MR.  Hypotension-has not tolerated coreg at all. On low dose losartan but BP trending up and he restarted coreg on Fri. He has low BP's after taking it. Will stop coreg and increase losartan 25 mg once daily. F/u bmet in 1 week. F/u virtual with me in 2 weeks  Hyperlipidemia LDL 59 on 03/22/19 on simvastatin  OSA on CPAP   mod MR, mild AS           Medication Adjustments/Labs and Tests Ordered: Current medicines are reviewed at length with the patient today.  Concerns regarding medicines are outlined above.  Medication changes, Labs and Tests ordered today are listed in the Patient Instructions below. Patient Instructions  Medication Instructions:  Your physician has recommended you make the following change in your medication:   1. INCREASE: losartan (cozaar) 25 mg tablet: Take 1 tablet by mouth once a day  2. DO NOT take carvedilol (coreg)   Lab work: Your physician recommends that you return for a lab work (BMET) on 06/30/19   If you have labs (blood work) drawn today and your tests are completely normal, you will receive your results only by:  [place] (if you have MyChart) OR  A paper copy in the mail If you have any lab test that is abnormal or we need to change your treatment, we will call you to review the results.  Testing/Procedures: None ordered  Follow-Up:  Follow up with Ermalinda Barrios, PA via VIRTUAL VISIT on 07/07/19 at 1:30  PM  Any Other Special Instructions Will Be Listed Below (If Applicable).       Signed, Ermalinda Barrios, PA-C  06/23/2019 1:58 PM    [facility] [place], [place], [place]  [address] Phone: [phone]; Fax: [phone]

## 2019-06-30 ENCOUNTER — Other Ambulatory Visit: Payer: Medicare Other

## 2019-06-30 ENCOUNTER — Other Ambulatory Visit: Payer: Self-pay

## 2019-06-30 DIAGNOSIS — E785 Hyperlipidemia, unspecified: Secondary | ICD-10-CM

## 2019-06-30 DIAGNOSIS — I952 Hypotension due to drugs: Secondary | ICD-10-CM

## 2019-06-30 DIAGNOSIS — I2510 Atherosclerotic heart disease of native coronary artery without angina pectoris: Secondary | ICD-10-CM

## 2019-06-30 LAB — BASIC METABOLIC PANEL
BUN/Creatinine Ratio: 21 (ref 10–24)
BUN: 23 mg/dL (ref 8–27)
CO2: 28 mmol/L (ref 20–29)
Calcium: 9.6 mg/dL (ref 8.6–10.2)
Chloride: 102 mmol/L (ref 96–106)
Creatinine, Ser: 1.08 mg/dL (ref 0.76–1.27)
GFR calc Af Amer: 72 mL/min/{1.73_m2} (ref >59)
GFR calc non Af Amer: 63 mL/min/{1.73_m2} (ref >59)
Glucose: 103 mg/dL — ABNORMAL HIGH (ref 65–99)
Potassium: 4.3 mmol/L (ref 3.5–5.2)
Sodium: 142 mmol/L (ref 134–144)

## 2019-07-06 NOTE — Progress Notes (Signed)
Virtual Visit via Video Note   This visit type was conducted due to national recommendations for restrictions regarding the COVID-19 Pandemic (e.g. social distancing) in an effort to limit this patient's exposure and mitigate transmission in our community.  Due to his co-morbid illnesses, this patient is at least at moderate risk for complications without adequate follow up.  This format is felt to be most appropriate for this patient at this time.  All issues noted in this document were discussed and addressed.  A limited physical exam was performed with this format.  Please refer to the patient's chart for his consent to telehealth for Aaron Schmidt.  Switched to phone visit -poor internet connection  Date:  07/07/2019   ID:  Aaron Schmidt, DOB 1932/11/01, MRN [id number]  Patient Location: Home Provider Location: Home  PCP:  Hulan Fess, MD  Cardiologist:  Ena Dawley, MD   Electrophysiologist:  None   Evaluation Performed:  Follow-Up Visit  Chief Complaint: F/U  History of Present Illness:    Aaron Schmidt is a 85 y.o. male with with history of hypertension, HLD, chronic LBBB, OSA on CPAP.  Patient was admitted with chest pain and confusion.  Troponin elevated to 2263.  EKG with chronic left bundle branch block.  Cardiac catheterization 03/23/2019 no evidence of CAD.  2D echo LVEF 45 to 50% with moderate concentric LVH and impaired relaxation.  When compared to echo in 2018 decrease in LV function with hypokinesis of the basal and mid inferior septal and inferior walls.  Was felt he could potentially could have had a mild myocarditis with new LV dysfunction.  Losartan 25 mg was added.  Encephalopathy resolved.    I restarted Coreg 3.125 mg twice daily and losartan 12.5 mg daily 04/07/2019.  I saw him again 04/21/2019 at which time I told him to increase his carvedilol to 6.25 mg twice daily.  F/U limited echo 05/28/19 LVEF normalized 60-65% Moderate MR  LOV 06/23/19 patient's BP  running higher and having a lot of back pain. He had restarted coreg but BP would drop after taking it.I stopped in and increased losartan 25 mg once daily.  BP doing much better. Average 120/60. Denies chest pain, shortness of breath, dizziness or presyncope. Still having back  Pain.   The patient does not have symptoms concerning for COVID-19 infection (fever, chills, cough, or new shortness of breath).    Past Medical History:  Diagnosis Date  . Arthritis    Degeneration spine & stenosis  . History of kidney stones   . Hypertension    has a histroy of  . Hypotension   . Sleep apnea 2012   used CPAP 2 yrs. ago, feels he sleeps better w/o, no longer using    Past Surgical History:  Procedure Laterality Date  . ARM NEUROPLASTY     at 14 yrs. of age- fell off tractor- had fracture & repair *& later- 60's had  transplantation of a nerve at the elbow  . BACK SURGERY     x4 back surgery x2 fusion -  . CARPAL TUNNEL RELEASE Right   . COLONOSCOPY    . ESOPHAGOGASTRODUODENOSCOPY (EGD) WITH PROPOFOL N/A 09/23/2017   Procedure: ESOPHAGOGASTRODUODENOSCOPY (EGD) WITH PROPOFOL;  Surgeon: Laurence Spates, MD;  Location: [facility];  Service: Endoscopy;  Laterality: N/A;  . LEFT HEART CATH AND CORONARY ANGIOGRAPHY N/A 03/23/2019   Procedure: LEFT HEART CATH AND CORONARY ANGIOGRAPHY;  Surgeon: Burnell Blanks, MD;  Location: [facility];  Service: Cardiovascular;  Laterality: N/A;  . PLEURAL EFFUSION DRAINAGE Left 09/09/2017   Procedure: DRAINAGE OF PLEURAL EFFUSION;  Surgeon: [name], MD;  Location: [facility];  Service: Thoracic;  Laterality: Left;  . POSTERIOR CERVICAL FUSION/FORAMINOTOMY N/A 06/24/2014   Procedure: Posterior Cervical Three-Seven Fusion with Lateral Mass Fixation;  Surgeon: Erline Levine, MD;  Location: [facility];  Service: Neurosurgery;  Laterality: N/A;  C3-C7 posterior cervical fusion with lateral mass fixation  . SHOULDER SURGERY Bilateral   .  VIDEO ASSISTED THORACOSCOPY (VATS)/DECORTICATION Left 09/09/2017   Procedure: VIDEO ASSISTED THORACOSCOPY (VATS)/DECORTICATION;  Surgeon: [name], MD;  Location: [facility];  Service: Thoracic;  Laterality: Left;  Marland Kitchen VIDEO BRONCHOSCOPY  09/09/2017   Procedure: VIDEO BRONCHOSCOPY;  Surgeon: [name], MD;  Location: [facility];  Service: Thoracic;;     Current Meds  Medication Sig  . aspirin EC 81 MG tablet Take 81 mg by mouth daily.  . Cholecalciferol (VITAMIN D) 2000 UNITS CAPS Take 2,000 Units by mouth daily.   . Ferrous Sulfate (IRON) 325 (65 Fe) MG TABS Take 1 tablet by mouth every other day.   . finasteride (PROSCAR) 5 MG tablet Take 5 mg by mouth daily.  Marland Kitchen HYDROmorphone (DILAUDID) 2 MG tablet Take 2 mg by mouth 4 (four) times daily as needed for severe pain.  Marland Kitchen L-Lysine 500 MG CAPS Take 500 mg by mouth daily.   Marland Kitchen losartan (COZAAR) 25 MG tablet Take 1 tablet (25 mg total) by mouth daily.  . mineral oil liquid Take 15 mLs by mouth at bedtime.   . Multiple Vitamin (MULTIVITAMIN WITH MINERALS) TABS tablet Take 1 tablet by mouth daily. Mature mens 106+  . nortriptyline (PAMELOR) 25 MG capsule Take 25 mg by mouth at bedtime.   Marland Kitchen OVER THE COUNTER MEDICATION Take 6 tablets by mouth daily. Perdiem 15 mg  . polyethylene glycol (MIRALAX / GLYCOLAX) packet Take 17 g by mouth daily.  . pregabalin (LYRICA) 150 MG capsule Take 150 mg by mouth 2 (two) times daily.  . saw palmetto 80 MG capsule Take 240 mg by mouth daily.   Marland Kitchen senna (SENOKOT) 8.6 MG TABS tablet Take 1 tablet (8.6 mg total) by mouth 2 (two) times a day.  . simvastatin (ZOCOR) 40 MG tablet Take 40 mg by mouth at bedtime.   Marland Kitchen tiZANidine (ZANAFLEX) 2 MG tablet Take 2 mg by mouth 3 (three) times daily.   . vitamin B-12 (CYANOCOBALAMIN) 100 MCG tablet Take 100 mcg by mouth daily.  . vitamin C (ASCORBIC ACID) 500 MG tablet Take 500 mg by mouth daily.     Allergies:   Diazepam   Social History   Tobacco Use  . Smoking  status: Former Smoker    Years: 24.00    Types: Pipe    Quit date: 09/24/1978    Years since quitting: 40.8  . Smokeless tobacco: Never Used  Substance Use Topics  . Alcohol use: Yes    Comment: 3 drinks/day - gin or vodka   . Drug use: No     Family Hx: The patient's family history includes Pancreatic cancer in his mother.  ROS:   Please see the history of present illness.      All other systems reviewed and are negative.   Prior CV studies:   The following studies were reviewed today:  Limited echo 05/28/19 IMPRESSIONS      1. The left ventricle has normal systolic function, with an ejection fraction of 60-65%. The  cavity size was normal. Left ventricular diastolic Doppler parameters are consistent with impaired relaxation. Indeterminate filling pressures.  2. Moderate mitral valve prolapse.  3. The mitral valve is abnormal. Moderate thickening of the mitral valve leaflet. Mild calcification of the mitral valve leaflet. Mitral valve regurgitation is moderate by color flow Doppler. The MR jet is anteriorly-directed.  4. When compared to the prior study: 03/22/19 EF 45-50%. PA pressure 83mmHg, mild to moderate MR.   SUMMARY   Limited for LV function. LVEF 60-65%, there is mitral valve thickening and calcification with late systolic prolapse of the posterior mitral leaflet and moderate anteriorly directed mitral regurgitation.  FINDINGS  Left Ventricle: The left ventricle has normal systolic function, with an ejection fraction of 60-65%. The cavity size was normal. Left ventricular diastolic Doppler parameters are consistent with impaired relaxation (grade I). Indeterminate filling  pressures.     Left Atrium: Left atrial size was normal in size.   Mitral Valve: The mitral valve is abnormal. Moderate thickening of the mitral valve leaflet. Mild calcification of the mitral valve leaflet. Mitral valve regurgitation is moderate by color flow Doppler. The MR jet is  anteriorly-directed. There is  moderate late systolic prolapse of of the posterior mitral leaflet of the mitral valve.   Compared to previous exam: 03/22/19 EF 45-50%. PA pressure 19mmHg, mild to moderate MR.        Left heart cath 6/29/20201. No angiographic evidence of CAD   Recommendations: No further ischemic workup.      2D echo 6/28/2020IMPRESSIONS      1. The left ventricle has mildly reduced systolic function, with an ejection fraction of 45-50%. The cavity size was normal. There is moderate concentric left ventricular hypertrophy. Left ventricular diastolic Doppler parameters are consistent with  impaired relaxation. Elevated left ventricular end-diastolic pressure.  2. The right ventricle has normal systolic function. The cavity was normal. There is no increase in right ventricular wall thickness. Right ventricular systolic pressure is moderately elevated with an estimated pressure of 42.9 mmHg.  3. Trivial pericardial effusion is present.  4. The mitral valve is degenerative. Moderate thickening of the mitral valve leaflet. Mild calcification of the mitral valve leaflet. Mitral valve regurgitation is mild to moderate by color flow Doppler.  5. The aortic valve is tricuspid. Moderate thickening of the aortic valve. Mild calcification of the aortic valve. Aortic valve regurgitation was not assessed by color flow Doppler. Mild stenosis of the aortic valve.  6. The inferior vena cava was dilated in size with >50% respiratory variability.   SUMMARY   When compared to the prior study from 01/15/2017 LVEF has decreased from 60-65% to 45-50% with hypokinesis of the basal and mid inferoseptal and inferior walls.  FINDINGS  Left Ventricle: The left ventricle has mildly reduced systolic function, with an ejection fraction of 45-50%. The cavity size was normal. There is moderate concentric left ventricular hypertrophy. Left ventricular diastolic Doppler parameters are  consistent with  impaired relaxation. Elevated left ventricular end-diastolic pressure           Labs/Other Tests and Data Reviewed:    EKG:  No ECG reviewed.  Recent Labs: 03/21/2019: ALT 25 03/23/2019: Hemoglobin 13.1; Platelets 148 03/24/2019: Magnesium 1.7 06/30/2019: BUN 23; Creatinine, Ser 1.08; Potassium 4.3; Sodium 142   Recent Lipid Panel Lab Results  Component Value Date/Time   CHOL 139 03/22/2019 12:00 PM   TRIG 72 03/22/2019 12:00 PM   HDL 66 03/22/2019 12:00 PM   CHOLHDL 2.1 03/22/2019 12:00 PM  [place] 59 03/22/2019 12:00 PM    Wt Readings from Last 3 Encounters:  07/07/19 177 lb (80.3 kg)  06/23/19 177 lb 1.9 oz (80.3 kg)  05/20/19 179 lb 6.4 oz (81.4 kg)     Objective:    Vital Signs:  BP (!) 144/77   Pulse 87   Wt 177 lb (80.3 kg)   BMI 25.04 kg/m    VITAL SIGNS:  reviewed  ASSESSMENT & PLAN:    CAD S/P NSTEMI with normal coronary arteries on cath 03/23/2019 but new LV dysfunction ejection fraction 45 to 50% with wall motion abnormalities that could represent mild myocarditis.  Repeat echo 05/28/19 normal LVEF [id number] with diastolic dysfunction and moderate MR.   Hypotension/HTN-has not tolerated coreg at all so stopped completely and losartan increased  25 mg once daily. Crt 1.08 06/30/19. BP doing much better on losartan alone.   Hyperlipidemia LDL 59 on 03/22/19 on simvastatin    mod MR, mild AS   COVID-19 Education: The signs and symptoms of COVID-19 were discussed with the patient and how to seek care for testing (follow up with PCP or arrange E-visit).   The importance of social distancing was discussed today.  Time:   Today, I have spent 4:45 minutes with the patient with telehealth technology discussing the above problems.     Medication Adjustments/Labs and Tests Ordered: Current medicines are reviewed at length with the patient today.  Concerns regarding medicines are outlined above.   Tests Ordered: No orders of the defined types were placed in this  encounter.   Medication Changes: No orders of the defined types were placed in this encounter.   Follow Up:  In Person in 1 month(s) Dr. [name]  Signed, Ermalinda Barrios, PA-C  07/07/2019 11:46 AM    [place]

## 2019-07-07 ENCOUNTER — Other Ambulatory Visit: Payer: Self-pay

## 2019-07-07 ENCOUNTER — Telehealth (INDEPENDENT_AMBULATORY_CARE_PROVIDER_SITE_OTHER): Payer: Medicare Other | Admitting: Physician Assistant

## 2019-07-07 ENCOUNTER — Encounter: Payer: Self-pay | Admitting: Physician Assistant

## 2019-07-07 VITALS — BP 144/77 | HR 87 | Wt 177.0 lb

## 2019-07-07 DIAGNOSIS — I10 Essential (primary) hypertension: Secondary | ICD-10-CM

## 2019-07-07 DIAGNOSIS — I951 Orthostatic hypotension: Secondary | ICD-10-CM

## 2019-07-07 DIAGNOSIS — E785 Hyperlipidemia, unspecified: Secondary | ICD-10-CM | POA: Diagnosis not present

## 2019-07-07 DIAGNOSIS — I2510 Atherosclerotic heart disease of native coronary artery without angina pectoris: Secondary | ICD-10-CM | POA: Diagnosis not present

## 2019-07-07 DIAGNOSIS — R011 Cardiac murmur, unspecified: Secondary | ICD-10-CM

## 2019-07-07 NOTE — Patient Instructions (Addendum)
Medication Instructions:  Your physician recommends that you continue on your current medications as directed. Please refer to the Current Medication list given to you today.  If you need a refill on your cardiac medications before your next appointment, please call your pharmacy.   Lab work: None Ordered  If you have labs (blood work) drawn today and your tests are completely normal, you will receive your results only by: Marland Kitchen MyChart Message (if you have MyChart) OR . A paper copy in the mail If you have any lab test that is abnormal or we need to change your treatment, we will call you to review the results.  Testing/Procedures: None ordered  Follow-Up: . Keep appointment with Dr. [name] on 07/31/19 at 9:00 AM  Any Other Special Instructions Will Be Listed Below (If Applicable).

## 2019-07-30 ENCOUNTER — Telehealth: Payer: Self-pay

## 2019-07-30 NOTE — Telephone Encounter (Signed)
Wife can come in being he is hard of hearing.

## 2019-07-30 NOTE — Telephone Encounter (Signed)
New message    Called patient to remind him of his appt, he said he will not come if his wife is not authorized to come with him.  He said he has a hard time hearing and if she isnt permitted , then cancel the appt.

## 2019-07-31 ENCOUNTER — Other Ambulatory Visit: Payer: Self-pay

## 2019-07-31 ENCOUNTER — Ambulatory Visit: Payer: Medicare Other | Admitting: Cardiology

## 2019-07-31 ENCOUNTER — Encounter: Payer: Self-pay | Admitting: Cardiology

## 2019-07-31 VITALS — BP 148/80 | HR 81 | Ht 70.5 in | Wt 175.8 lb

## 2019-07-31 DIAGNOSIS — I10 Essential (primary) hypertension: Secondary | ICD-10-CM

## 2019-07-31 NOTE — Patient Instructions (Addendum)
Medication Instructions:   STOP TAKING IRON SUPPLEMENT NOW   *If you need a refill on your cardiac medications before your next appointment, please call your pharmacy*    Follow-Up: At [facility], you and your health needs are our priority.  As part of our continuing mission to provide you with exceptional heart care, we have created designated Provider Care Teams.  These Care Teams include your primary Cardiologist (physician) and Advanced Practice Providers (APPs -  Physician Assistants and Nurse Practitioners) who all work together to provide you with the care you need, when you need it.  Your next appointment:   Your physician wants you to follow-up in: [place] will receive a reminder letter in the mail two months in advance. If you don't receive a letter, please call our office to schedule the follow-up appointment.   The format for your next appointment:   Either In Person or Virtual  Provider:   Ena Dawley, MD

## 2019-07-31 NOTE — Progress Notes (Signed)
Cardiology Office Note    Date:  07/31/2019   ID:  Aaron Schmidt, DOB [birth year], MRN [id number]  PCP:  Hulan Fess, MD  Cardiologist: Ena Dawley, MD EPS: None  Reason for visit: 6 weeks follow-up  History of Present Illness:  Aaron Schmidt is a 85 y.o. male with history of hypertension, HLD, chronic LBBB, OSA on CPAP.    In June 2020 with non-STEMI.  Troponin elevated to 2263.  EKG with chronic left bundle branch block.  Cardiac catheterization 03/23/2019 no evidence of CAD.  2D echo LVEF 45 to 50% with moderate concentric LVH and impaired relaxation.  When compared to echo in 2018 decrease in LV function with hypokinesis of the basal and mid inferior septal and inferior walls.  Was felt he could potentially could have had a mild myocarditis with new LV dysfunction.  Losartan 25 mg was added.  Encephalopathy resolved.  He was restarted on Coreg and losartan. He was last seen in September 2020 when his blood pressure was elevated in the 140s to 150s and his medications were uptitrated. He underwent an echocardiography and his LVEF increased from 45 to 50% to 60 to 65%.  07/31/2019 -patient is coming after 6 weeks. The patient is feeling well, denies chest pain or SOB.  He states that he continues to feel very tired, he had 2 episodes of dizziness when he got up quickly.  But no falls.  No lower extremity edema.  No myalgias with simvastatin.   Past Medical History:  Diagnosis Date  . Arthritis    Degeneration spine & stenosis  . History of kidney stones   . Hypertension    has a histroy of  . Hypotension   . Sleep apnea 2012   used CPAP 2 yrs. ago, feels he sleeps better w/o, no longer using     Past Surgical History:  Procedure Laterality Date  . ARM NEUROPLASTY     at 14 yrs. of age- fell off tractor- had fracture & repair *& later- 73's had  transplantation of a nerve at the elbow  . BACK SURGERY     x4 back surgery x2 fusion -  . CARPAL TUNNEL RELEASE Right   .  COLONOSCOPY    . ESOPHAGOGASTRODUODENOSCOPY (EGD) WITH PROPOFOL N/A 09/23/2017   Procedure: ESOPHAGOGASTRODUODENOSCOPY (EGD) WITH PROPOFOL;  Surgeon: Laurence Spates, MD;  Location: [facility];  Service: Endoscopy;  Laterality: N/A;  . LEFT HEART CATH AND CORONARY ANGIOGRAPHY N/A 03/23/2019   Procedure: LEFT HEART CATH AND CORONARY ANGIOGRAPHY;  Surgeon: Burnell Blanks, MD;  Location: [facility];  Service: Cardiovascular;  Laterality: N/A;  . PLEURAL EFFUSION DRAINAGE Left 09/09/2017   Procedure: DRAINAGE OF PLEURAL EFFUSION;  Surgeon: [name], MD;  Location: [facility];  Service: Thoracic;  Laterality: Left;  . POSTERIOR CERVICAL FUSION/FORAMINOTOMY N/A 06/24/2014   Procedure: Posterior Cervical Three-Seven Fusion with Lateral Mass Fixation;  Surgeon: Erline Levine, MD;  Location: [facility];  Service: Neurosurgery;  Laterality: N/A;  C3-C7 posterior cervical fusion with lateral mass fixation  . SHOULDER SURGERY Bilateral   . VIDEO ASSISTED THORACOSCOPY (VATS)/DECORTICATION Left 09/09/2017   Procedure: VIDEO ASSISTED THORACOSCOPY (VATS)/DECORTICATION;  Surgeon: [name], MD;  Location: [facility];  Service: Thoracic;  Laterality: Left;  Marland Kitchen VIDEO BRONCHOSCOPY  09/09/2017   Procedure: VIDEO BRONCHOSCOPY;  Surgeon: [name], MD;  Location: [facility];  Service: Thoracic;;    Current Medications: Current Meds  Medication Sig  . aspirin EC  81 MG tablet Take 81 mg by mouth daily.  . Cholecalciferol (VITAMIN D) 2000 UNITS CAPS Take 2,000 Units by mouth daily.   . finasteride (PROSCAR) 5 MG tablet Take 5 mg by mouth daily.  Marland Kitchen HYDROmorphone (DILAUDID) 2 MG tablet Take 2 mg by mouth 4 (four) times daily as needed for severe pain.  Marland Kitchen L-Lysine 500 MG CAPS Take 500 mg by mouth daily.   Marland Kitchen losartan (COZAAR) 25 MG tablet Take 1 tablet (25 mg total) by mouth daily.  . mineral oil liquid Take 15 mLs by mouth at bedtime.   . Multiple Vitamin (MULTIVITAMIN WITH  MINERALS) TABS tablet Take 1 tablet by mouth daily. Mature mens 13+  . nortriptyline (PAMELOR) 25 MG capsule Take 25 mg by mouth at bedtime.   Marland Kitchen OVER THE COUNTER MEDICATION Take 6 tablets by mouth daily. Perdiem 15 mg  . polyethylene glycol (MIRALAX / GLYCOLAX) packet Take 17 g by mouth daily.  . pregabalin (LYRICA) 150 MG capsule Take 150 mg by mouth 2 (two) times daily.  . saw palmetto 80 MG capsule Take 240 mg by mouth daily.   . simvastatin (ZOCOR) 40 MG tablet Take 40 mg by mouth at bedtime.   Marland Kitchen tiZANidine (ZANAFLEX) 2 MG tablet Take 2 mg by mouth 3 (three) times daily.   . vitamin B-12 (CYANOCOBALAMIN) 100 MCG tablet Take 100 mcg by mouth daily.  . vitamin C (ASCORBIC ACID) 500 MG tablet Take 500 mg by mouth daily.  . [DISCONTINUED] Ferrous Sulfate (IRON) 325 (65 Fe) MG TABS Take 1 tablet by mouth every other day.      Allergies:   Diazepam   Social History   Socioeconomic History  . Marital status: Married    Spouse name: Not on file  . Number of children: Not on file  . Years of education: Not on file  . Highest education level: Not on file  Occupational History  . Occupation: Retired    Comment: Worked for [employer] and retired in [place]  . Financial resource strain: Not on file  . Food insecurity    Worry: Not on file    Inability: Not on file  . Transportation needs    Medical: Not on file    Non-medical: Not on file  Tobacco Use  . Smoking status: Former Smoker    Years: 24.00    Types: Pipe    Quit date: 09/24/1978    Years since quitting: 40.8  . Smokeless tobacco: Never Used  Substance and Sexual Activity  . Alcohol use: Yes    Comment: 3 drinks/day - gin or vodka   . Drug use: No  . Sexual activity: Not on file  Lifestyle  . Physical activity    Days per week: Not on file    Minutes per session: Not on file  . Stress: Not on file  Relationships  . Social Herbalist on phone: Not on file    Gets together: Not on file    Attends  religious service: Not on file    Active member of club or organization: Not on file    Attends meetings of clubs or organizations: Not on file    Relationship status: Not on file  Other Topics Concern  . Not on file  Social History Narrative  . Not on file     Family History:  The patient's   family history includes Pancreatic cancer in his mother.   ROS:  Please see the history of present illness.    ROS All other systems reviewed and are negative.  PHYSICAL EXAM:    VS:  BP (!) 148/80   Pulse 81   Ht 5' 10.5" (1.791 m)   Wt 175 lb 12.8 oz (79.7 kg)   SpO2 99%   BMI 24.87 kg/m   Physical Exam  GEN: Well nourished, well developed, in no acute distress  Neck: no JVD, carotid bruits, or masses Cardiac:RRR; 4/6 systolic murmur apex Respiratory:  clear to auscultation bilaterally, normal work of breathing GI: soft, nontender, nondistended, + BS Ext: without cyanosis, clubbing, or edema, Good distal pulses bilaterally Neuro:  Alert and Oriented x 3 Psych: euthymic mood, full affect  Wt Readings from Last 3 Encounters:  07/31/19 175 lb 12.8 oz (79.7 kg)  07/07/19 177 lb (80.3 kg)  06/23/19 177 lb 1.9 oz (80.3 kg)    Studies/Labs Reviewed:   EKG:  EKG is not ordered today.    Recent Labs: 03/21/2019: ALT 25 03/23/2019: Hemoglobin 13.1; Platelets 148 03/24/2019: Magnesium 1.7 06/30/2019: BUN 23; Creatinine, Ser 1.08; Potassium 4.3; Sodium 142   Lipid Panel    Component Value Date/Time   CHOL 139 03/22/2019 1200   TRIG 72 03/22/2019 1200   HDL 66 03/22/2019 1200   CHOLHDL 2.1 03/22/2019 1200   VLDL 14 03/22/2019 1200   LDLCALC 59 03/22/2019 1200   Additional studies/ records that were reviewed today include:   TTE 05/28/19    1. The left ventricle has normal systolic function, with an ejection fraction of 60-65%. The cavity size was normal. Left ventricular diastolic Doppler parameters are consistent with impaired relaxation. Indeterminate filling pressures.  2.  Moderate mitral valve prolapse.  3. The mitral valve is abnormal. Moderate thickening of the mitral valve leaflet. Mild calcification of the mitral valve leaflet. Mitral valve regurgitation is moderate by color flow Doppler. The MR jet is anteriorly-directed.  4. When compared to the prior study: 03/22/19 EF 45-50%. PA pressure 59mmHg, mild to moderate MR.  Left heart cath 6/29/20201. No angiographic evidence of CAD   Recommendations: No further ischemic workup.    PLAN:  In order of problems listed above:  NSTEMI with elevated troponin possible myocarditis, LV dysfunction with EF 45 to 50% now improved to 60 to 65%.  He has grade 1 diastolic dysfunction and moderate mitral regurgitation in the settings of mitral valve prolapse.   He is asymptomatic other than hypertension, he is advised to increase his hydration, and switch taking losartan from the morning in the afternoon.  Carvedilol has been discontinued because of hypotension.  It was suggested that he cuts losartan to half however he states the pill is very small and he would be worried that he does not take the same dosage every time.  Hypotension-as above  Hyperlipidemia LDL 59 on 03/22/19 on simvastatin, he is tolerating it well.  OSA on CPAP  Moderate mitral regurgitation, mild aortic stenosis, stable, will follow.      Medication Adjustments/Labs and Tests Ordered: Current medicines are reviewed at length with the patient today.  Concerns regarding medicines are outlined above.  Medication changes, Labs and Tests ordered today are listed in the Patient Instructions below. Patient Instructions  Medication Instructions:   STOP TAKING IRON SUPPLEMENT NOW   *If you need a refill on your cardiac medications before your next appointment, please call your pharmacy*    Follow-Up: At [facility], you and your health needs are our priority.  As part  of our continuing mission to provide you with exceptional heart care, we have  created designated Provider Care Teams.  These Care Teams include your primary Cardiologist (physician) and Advanced Practice Providers (APPs -  Physician Assistants and Nurse Practitioners) who all work together to provide you with the care you need, when you need it.  Your next appointment:   Your physician wants you to follow-up in: [facility] will receive a reminder letter in the mail two months in advance. If you don't receive a letter, please call our office to schedule the follow-up appointment.   The format for your next appointment:   Either In Person or Virtual  Provider:   Ena Dawley, MD       Signed, Ena Dawley, MD  07/31/2019 9:42 AM    [address] [place], [place],   [address] Phone: [phone]; Fax: [phone]

## 2019-08-02 ENCOUNTER — Other Ambulatory Visit: Payer: Self-pay

## 2019-08-02 ENCOUNTER — Emergency Department (HOSPITAL_COMMUNITY)
Admission: EM | Admit: 2019-08-02 | Discharge: 2019-08-02 | Disposition: A | Discharge status: Home / Self Care | Payer: Medicare Other | Attending: Emergency Medicine | Admitting: Emergency Medicine

## 2019-08-02 DIAGNOSIS — Z79899 Other long term (current) drug therapy: Secondary | ICD-10-CM | POA: Insufficient documentation

## 2019-08-02 DIAGNOSIS — M5431 Sciatica, right side: Secondary | ICD-10-CM

## 2019-08-02 DIAGNOSIS — I10 Essential (primary) hypertension: Secondary | ICD-10-CM | POA: Insufficient documentation

## 2019-08-02 DIAGNOSIS — Z7982 Long term (current) use of aspirin: Secondary | ICD-10-CM | POA: Diagnosis not present

## 2019-08-02 DIAGNOSIS — Z87891 Personal history of nicotine dependence: Secondary | ICD-10-CM | POA: Insufficient documentation

## 2019-08-02 DIAGNOSIS — M5441 Lumbago with sciatica, right side: Secondary | ICD-10-CM | POA: Insufficient documentation

## 2019-08-02 DIAGNOSIS — I2510 Atherosclerotic heart disease of native coronary artery without angina pectoris: Secondary | ICD-10-CM | POA: Diagnosis not present

## 2019-08-02 DIAGNOSIS — M7918 Myalgia, other site: Secondary | ICD-10-CM

## 2019-08-02 DIAGNOSIS — M545 Low back pain: Secondary | ICD-10-CM | POA: Diagnosis present

## 2019-08-02 MED ORDER — METHYLPREDNISOLONE 4 MG PO TBPK: ORAL_TABLET | ORAL | 0 refills | Status: DC

## 2019-08-02 MED ORDER — HYDROMORPHONE HCL 1 MG/ML IJ SOLN
1.0000 mg | Freq: Once | INTRAMUSCULAR | Status: AC
  Administered 2019-08-02: 1 mg via INTRAMUSCULAR
  Filled 2019-08-02: qty 1

## 2019-08-02 MED ORDER — PREDNISONE 20 MG PO TABS
40.0000 mg | ORAL_TABLET | Freq: Once | ORAL | Status: AC
  Administered 2019-08-02: 40 mg via ORAL
  Filled 2019-08-02: qty 2

## 2019-08-02 NOTE — ED Triage Notes (Addendum)
Pt c/o low back pain, primarily on the right but on the left as well. Pt is having difficulty moving/flexing/bearing weight on left leg.

## 2019-08-02 NOTE — ED Provider Notes (Signed)
[facility] EMERGENCY DEPARTMENT Provider Note  CSN: [id number] Arrival date & time: 08/02/19 [id number]  Chief Complaint(s) Back Pain  HPI Aaron Schmidt is a 85 y.o. male with a history of degenerative disc disease who presents to the emergency department with right gluteal pain that began midnight while he was sleeping.  Pain is severe.  Described as sharp.  Exacerbated with movement and palpation of the right glue.  Relieved by immobility.  Patient tried taking his home dose of oral Dilaudid without significant relief.  Denied any recent falls or trauma.  No right lower extremity paresthesia or numbness.  Patient has chronic left lower extremity numbness from prior radiculopathy.  No bladder/bowel incontinence.    HPI  Past Medical History Past Medical History:  Diagnosis Date   Arthritis    Degeneration spine & stenosis   History of kidney stones    Hypertension    has a histroy of   Hypotension    Sleep apnea 2012   used CPAP 2 yrs. ago, feels he sleeps better w/o, no longer using    Patient Active Problem List   Diagnosis Date Noted   Hypotension 06/23/2019   Myocarditis ([place]) 05/19/2019   CAD (coronary artery disease) 04/20/2019   Obesity 04/06/2019   Malnutrition of moderate degree 03/23/2019   NSTEMI (non-ST elevated myocardial infarction) ([place]) 03/21/2019   Back pain 03/21/2019   Anemia 09/21/2017   Symptomatic anemia 09/20/2017   Protein-calorie malnutrition, severe 09/11/2017   Empyema lung ([place]) 09/09/2017   SIRS (systemic inflammatory response syndrome) ([place]) 09/05/2017   Elevated troponin 09/05/2017   Normochromic normocytic anemia 09/05/2017   Hyponatremia 09/05/2017   Acute kidney injury superimposed on chronic kidney disease ([place]) 09/05/2017   Chronic pain 09/05/2017   Pleural effusion, left 09/04/2017   Pleural effusion on left 08/19/2017   Sinusitis, chronic 08/19/2017   Murmur 01/08/2017   Skin ulcer of toe  of left foot, limited to breakdown of skin ([place]) 09/04/2016   Cervical myelopathy with cervical radiculopathy 06/24/2014   LBBB (left bundle branch block) 06/21/2014   Hyperlipidemia 06/21/2014   Preoperative cardiovascular examination 06/21/2014   OBSTRUCTIVE SLEEP APNEA 09/30/2010   Essential hypertension 08/29/2010   Home Medication(s) Prior to Admission medications   Medication Sig Start Date End Date Taking? Authorizing Provider  aspirin EC 81 MG tablet Take 81 mg by mouth daily.    [provider]  Cholecalciferol (VITAMIN D) 2000 UNITS CAPS Take 2,000 Units by mouth daily.     [provider]  finasteride (PROSCAR) 5 MG tablet Take 5 mg by mouth daily.    [provider]  HYDROmorphone (DILAUDID) 2 MG tablet Take 2 mg by mouth 4 (four) times daily as needed for severe pain.    [provider]  L-Lysine 500 MG CAPS Take 500 mg by mouth daily.     [provider]  losartan (COZAAR) 25 MG tablet Take 1 tablet (25 mg total) by mouth daily. 06/23/19   Imogene Burn, PA-C  methylPREDNISolone (MEDROL DOSEPAK) 4 MG TBPK tablet Use as directed on the package 08/02/19   Elexia Friedt, Grayce Sessions, MD  mineral oil liquid Take 15 mLs by mouth at bedtime.     [provider]  Multiple Vitamin (MULTIVITAMIN WITH MINERALS) TABS tablet Take 1 tablet by mouth daily. Mature mens 50+    [provider]  nortriptyline (PAMELOR) 25 MG capsule Take 25 mg by mouth at bedtime.     [provider]  OVER THE COUNTER MEDICATION Take 6 tablets by mouth daily. Perdiem 15 mg    [provider]  polyethylene glycol (MIRALAX / GLYCOLAX) packet Take 17 g by mouth daily. 10/13/17   Sherwood Gambler, MD  pregabalin (LYRICA) 150 MG capsule Take 150 mg by mouth 2 (two) times daily.    [provider]  saw palmetto 80 MG capsule Take 240 mg by mouth daily.     [provider]  simvastatin (ZOCOR) 40 MG tablet Take 40 mg by  mouth at bedtime.     [provider]  tiZANidine (ZANAFLEX) 2 MG tablet Take 2 mg by mouth 3 (three) times daily.     [provider]  vitamin B-12 (CYANOCOBALAMIN) 100 MCG tablet Take 100 mcg by mouth daily.    [provider]  vitamin C (ASCORBIC ACID) 500 MG tablet Take 500 mg by mouth daily.    [provider]                                                                                                                                    Past Surgical History Past Surgical History:  Procedure Laterality Date   ARM NEUROPLASTY     at 14 yrs. of age- fell off tractor- had fracture & repair *& later- 1990's had  transplantation of a nerve at the elbow   BACK SURGERY     x4 back surgery x2 fusion -   CARPAL TUNNEL RELEASE Right    COLONOSCOPY     ESOPHAGOGASTRODUODENOSCOPY (EGD) WITH PROPOFOL N/A 09/23/2017   Procedure: ESOPHAGOGASTRODUODENOSCOPY (EGD) WITH PROPOFOL;  Surgeon: Laurence Spates, MD;  Location: [facility];  Service: Endoscopy;  Laterality: N/A;   LEFT HEART CATH AND CORONARY ANGIOGRAPHY N/A 03/23/2019   Procedure: LEFT HEART CATH AND CORONARY ANGIOGRAPHY;  Surgeon: Burnell Blanks, MD;  Location: [facility];  Service: Cardiovascular;  Laterality: N/A;   PLEURAL EFFUSION DRAINAGE Left 09/09/2017   Procedure: DRAINAGE OF PLEURAL EFFUSION;  Surgeon: [name], MD;  Location: [facility];  Service: Thoracic;  Laterality: Left;   POSTERIOR CERVICAL FUSION/FORAMINOTOMY N/A 06/24/2014   Procedure: Posterior Cervical Three-Seven Fusion with Lateral Mass Fixation;  Surgeon: Erline Levine, MD;  Location: [facility];  Service: Neurosurgery;  Laterality: N/A;  C3-C7 posterior cervical fusion with lateral mass fixation   SHOULDER SURGERY Bilateral    VIDEO ASSISTED THORACOSCOPY (VATS)/DECORTICATION Left 09/09/2017   Procedure: VIDEO ASSISTED THORACOSCOPY (VATS)/DECORTICATION;  Surgeon: [name], MD;  Location:  [facility];  Service: Thoracic;  Laterality: Left;   VIDEO BRONCHOSCOPY  09/09/2017   Procedure: VIDEO BRONCHOSCOPY;  Surgeon: [name], MD;  Location: [facility];  Service: Thoracic;;   Family History Family History  Problem Relation Age of Onset   Pancreatic cancer Mother     Social History Social History   Tobacco Use   Smoking status: Former Smoker    Years:  24.00    Types: Pipe    Quit date: 09/24/1978    Years since quitting: 40.8   Smokeless tobacco: Never Used  Substance Use Topics   Alcohol use: Yes    Comment: 3 drinks/day - gin or vodka    Drug use: No   Allergies Diazepam  Review of Systems Review of Systems All other systems are reviewed and are negative for acute change except as noted in the HPI  Physical Exam Vital Signs  I have reviewed the triage vital signs BP (!) 151/72    Pulse 79    Temp 98 F (36.7 C) (Oral)    Resp 20    Ht 5\' 10"  (1.778 m)    Wt 79 kg    SpO2 100%    BMI 24.99 kg/m   Physical Exam Vitals signs reviewed.  Constitutional:      General: He is not in acute distress.    Appearance: He is well-developed. He is not diaphoretic.  HENT:     Head: Normocephalic and atraumatic.     Jaw: No trismus.     Right Ear: External ear normal.     Left Ear: External ear normal.     Nose: Nose normal.  Eyes:     General: No scleral icterus.    Conjunctiva/sclera: Conjunctivae normal.  Neck:     Musculoskeletal: Normal range of motion.     Trachea: Phonation normal.  Cardiovascular:     Rate and Rhythm: Normal rate and regular rhythm.  Pulmonary:     Effort: Pulmonary effort is normal. No respiratory distress.     Breath sounds: No stridor.  Abdominal:     General: There is no distension.  Musculoskeletal: Normal range of motion.     Lumbar back: He exhibits tenderness and pain. He exhibits normal range of motion, no bony tenderness, no deformity and no spasm.       Back:  Neurological:     Mental Status: He is alert and  oriented to person, place, and time.     Comments: Spine Exam: Strength: 5/5 throughout RLE , 4/5 throughout LLE Sensation: Intact to light touch in proximal and distal LE with slight decrease in LLE    Psychiatric:        Behavior: Behavior normal.     ED Results and Treatments Labs (all labs ordered are listed, but only abnormal results are displayed) Labs Reviewed - No data to display                                                                                                                       EKG  EKG Interpretation  Date/Time:    Ventricular Rate:    PR Interval:    QRS Duration:   QT Interval:    QTC Calculation:   R Axis:     Text Interpretation:        Radiology No results found.  Pertinent labs & imaging results that were available during  my care of the patient were reviewed by me and considered in my medical decision making (see chart for details).  Medications Ordered in ED Medications  HYDROmorphone (DILAUDID) injection 1 mg (1 mg Intramuscular Given 08/02/19 0622)  predniSONE (DELTASONE) tablet 40 mg (40 mg Oral Given 08/02/19 0622)                                                                                                                                    Procedures Procedures  (including critical care time)  Medical Decision Making / ED Course I have reviewed the nursing notes for this encounter and the patient's prior records (if available in EHR or on provided paperwork).   KASSIDY KOEHNKE was evaluated in Emergency Department on 08/02/2019 for the symptoms described in the history of present illness. He was evaluated in the context of the global COVID-19 pandemic, which necessitated consideration that the patient might be at risk for infection with the SARS-CoV-2 virus that causes COVID-19. Institutional protocols and algorithms that pertain to the evaluation of patients at risk for COVID-19 are in a state of rapid change based on information  released by regulatory bodies including the CDC and federal and state organizations. These policies and algorithms were followed during the patient's care in the ED.  85 y.o. male presents with back pain in right gluteal area for 4 hrs. No acute traumatic onset. No red flag symptoms of fever, weight loss, saddle anesthesia, weakness, fecal/urinary incontinence or urinary retention.   Recent CTA without evidence of AAA.  Suspect MSK etiology vs radiculopathy. No indication for imaging emergently. Return precautions discussed for worsening or new concerning symptoms.        Final Clinical Impression(s) / ED Diagnoses Final diagnoses:  Gluteal pain  Sciatica of right side     The patient appears reasonably screened and/or stabilized for discharge and I doubt any other medical condition or other [facility] requiring further screening, evaluation, or treatment in the ED at this time prior to discharge.  Disposition: Discharge  Condition: Good  I have discussed the results, Dx and Tx plan with the patient who expressed understanding and agree(s) with the plan. Discharge instructions discussed at great length. The patient was given strict return precautions who verbalized understanding of the instructions. No further questions at time of discharge.    ED Discharge Orders         Ordered    methylPREDNISolone (MEDROL DOSEPAK) 4 MG TBPK tablet     08/02/19 [id number]          Follow Up: Erline Levine, MD [address] [place] [address] [phone]  Call       This chart was dictated using voice recognition software.  Despite best efforts to proofread,  errors can occur which can change the documentation meaning.   Fatima Blank, MD 08/02/19 [phone]

## 2019-08-19 DIAGNOSIS — M48061 Spinal stenosis, lumbar region without neurogenic claudication: Secondary | ICD-10-CM

## 2019-08-19 HISTORY — DX: Spinal stenosis, lumbar region without neurogenic claudication: M48.061

## 2019-08-26 ENCOUNTER — Other Ambulatory Visit (HOSPITAL_BASED_OUTPATIENT_CLINIC_OR_DEPARTMENT_OTHER): Payer: Self-pay | Admitting: Neurosurgery

## 2019-08-26 DIAGNOSIS — M5416 Radiculopathy, lumbar region: Secondary | ICD-10-CM

## 2019-08-29 ENCOUNTER — Ambulatory Visit (HOSPITAL_BASED_OUTPATIENT_CLINIC_OR_DEPARTMENT_OTHER)
Admission: RE | Admit: 2019-08-29 | Discharge: 2019-08-29 | Disposition: A | Discharge status: Home / Self Care | Payer: Medicare Other | Source: Ambulatory Visit | Attending: Neurosurgery | Admitting: Neurosurgery

## 2019-08-29 ENCOUNTER — Other Ambulatory Visit: Payer: Self-pay

## 2019-08-29 DIAGNOSIS — M5416 Radiculopathy, lumbar region: Secondary | ICD-10-CM | POA: Diagnosis not present

## 2019-08-29 MED ORDER — GADOBUTROL 1 MMOL/ML IV SOLN
7.5000 mL | Freq: Once | INTRAVENOUS | Status: AC | PRN
  Administered 2019-08-29: 7.5 mL via INTRAVENOUS

## 2019-08-31 ENCOUNTER — Other Ambulatory Visit: Payer: Self-pay | Admitting: Neurosurgery

## 2019-08-31 DIAGNOSIS — Z6825 Body mass index (BMI) 25.0-25.9, adult: Secondary | ICD-10-CM

## 2019-08-31 DIAGNOSIS — M48062 Spinal stenosis, lumbar region with neurogenic claudication: Secondary | ICD-10-CM | POA: Insufficient documentation

## 2019-08-31 DIAGNOSIS — Z13828 Encounter for screening for other musculoskeletal disorder: Secondary | ICD-10-CM

## 2019-08-31 HISTORY — DX: Encounter for screening for other musculoskeletal disorder: Z13.828

## 2019-08-31 HISTORY — DX: Spinal stenosis, lumbar region with neurogenic claudication: M48.062

## 2019-08-31 HISTORY — DX: Body mass index (BMI) 25.0-25.9, adult: Z68.25

## 2019-09-17 NOTE — Pre-Procedure Instructions (Signed)
Aaron Schmidt  09/17/2019      [place], [place] [address] Phone: [phone] Fax: [phone]  [id number] - [place], [place] - [address] AT [place] [address] [place] [address] Phone: [phone] Fax: [phone]    Your procedure is scheduled on Tuesday, September 22, 2019  Report to [place] at [place].M.  Call this number if you have problems the morning of surgery:  [phone]   Remember:  Do not eat or drink after midnight.     Take these medicines the morning of surgery with A SIP OF WATER: Diclofenac (Voltaren) Finasteride (Proscar) Hydromorphone (Dilaudid) Tizanidine (Zanaflex)   7 days prior to surgery STOP taking any Aspirin (unless otherwise instructed by your surgeon), Aleve, Naproxen, Ibuprofen, Motrin, Advil, Goody's, BC's, all herbal medications, fish oil, and all vitamins.   Do not wear jewelry.  Do not wear lotions, powders, cologne, or deodorant.  Men may shave face and neck.  Do not bring valuables to the hospital.  [facility] is not responsible for any belongings or valuables.   [place]- Preparing For Surgery  Before surgery, you can play an important role. Because skin is not sterile, your skin needs to be as free of germs as possible. You can reduce the number of germs on your skin by washing with CHG (chlorahexidine gluconate) Soap before surgery.  CHG is an antiseptic cleaner which kills germs and bonds with the skin to continue killing germs even after washing.    Oral Hygiene is also important to reduce your risk of infection.  Remember - BRUSH YOUR TEETH THE MORNING OF SURGERY WITH YOUR REGULAR TOOTHPASTE  Please do not use if you have an allergy to CHG or antibacterial soaps. If your skin becomes reddened/irritated stop using the CHG.  Do not shave (including legs and  underarms) for at least 48 hours prior to first CHG shower. It is OK to shave your face.  Please follow these instructions carefully.   1. Shower the NIGHT BEFORE SURGERY and the MORNING OF SURGERY with CHG.   2. If you chose to wash your hair, wash your hair first as usual with your normal shampoo.  3. After you shampoo, rinse your hair and body thoroughly to remove the shampoo.  4. Use CHG as you would any other liquid soap. You can apply CHG directly to the skin and wash gently with a scrungie or a clean washcloth.   5. Apply the CHG Soap to your body ONLY FROM THE NECK DOWN.  Do not use on open wounds or open sores. Avoid contact with your eyes, ears, mouth and genitals (private parts). Wash Face and genitals (private parts)  with your normal soap.  6. Wash thoroughly, paying special attention to the area where your surgery will be performed.  7. Thoroughly rinse your body with warm water from the neck down.  8. DO NOT shower/wash with your normal soap after using and rinsing off the CHG Soap.  9. Pat yourself dry with a CLEAN TOWEL.  10. Wear CLEAN PAJAMAS to bed the night before surgery, wear comfortable clothes the morning of surgery  11. Place CLEAN SHEETS on your bed the night of your first shower and DO NOT SLEEP WITH PETS.  Day of Surgery:  Do not apply any deodorants/lotions.  Please wear clean clothes to the hospital/surgery center.   Remember to brush your teeth WITH YOUR REGULAR TOOTHPASTE.  Contacts, dentures or bridgework may not be worn into surgery.  Leave your suitcase in the car.  After surgery it may be brought to your room.  For patients admitted to the hospital, discharge time will be determined by your treatment team.  Patients discharged the day of surgery will not be allowed to drive home.

## 2019-09-21 ENCOUNTER — Other Ambulatory Visit: Payer: Self-pay

## 2019-09-21 ENCOUNTER — Encounter (HOSPITAL_COMMUNITY)
Admission: RE | Admit: 2019-09-21 | Discharge: 2019-09-21 | Disposition: A | Discharge status: Home / Self Care | Payer: Medicare Other | Source: Ambulatory Visit | Attending: Neurosurgery | Admitting: Neurosurgery

## 2019-09-21 ENCOUNTER — Other Ambulatory Visit (HOSPITAL_COMMUNITY)
Admission: RE | Admit: 2019-09-21 | Discharge: 2019-09-21 | Disposition: A | Discharge status: Home / Self Care | Payer: Medicare Other | Source: Ambulatory Visit | Attending: Neurosurgery | Admitting: Neurosurgery

## 2019-09-21 ENCOUNTER — Encounter (HOSPITAL_COMMUNITY): Payer: Self-pay

## 2019-09-21 DIAGNOSIS — Z01812 Encounter for preprocedural laboratory examination: Secondary | ICD-10-CM | POA: Insufficient documentation

## 2019-09-21 LAB — COMPREHENSIVE METABOLIC PANEL
ALT: 27 U/L (ref 0–44)
AST: 23 U/L (ref 15–41)
Albumin: 4.1 g/dL (ref 3.5–5.0)
Alkaline Phosphatase: 79 U/L (ref 38–126)
Anion gap: 8 (ref 5–15)
BUN: 22 mg/dL (ref 8–23)
CO2: 30 mmol/L (ref 22–32)
Calcium: 10.2 mg/dL (ref 8.9–10.3)
Chloride: 101 mmol/L (ref 98–111)
Creatinine, Ser: 0.93 mg/dL (ref 0.61–1.24)
GFR calc Af Amer: >60 mL/min (ref >60)
GFR calc non Af Amer: >60 mL/min (ref >60)
Glucose, Bld: 107 mg/dL — ABNORMAL HIGH (ref 70–99)
Potassium: 4.2 mmol/L (ref 3.5–5.1)
Sodium: 139 mmol/L (ref 135–145)
Total Bilirubin: 0.7 mg/dL (ref 0.3–1.2)
Total Protein: 6.5 g/dL (ref 6.5–8.1)

## 2019-09-21 LAB — CBC
HCT: 42.5 % (ref 39.0–52.0)
Hemoglobin: 13.5 g/dL (ref 13.0–17.0)
MCH: 31.1 pg (ref 26.0–34.0)
MCHC: 31.8 g/dL (ref 30.0–36.0)
MCV: 97.9 fL (ref 80.0–100.0)
Platelets: 190 10*3/uL (ref 150–400)
RBC: 4.34 MIL/uL (ref 4.22–5.81)
RDW: 14.4 % (ref 11.5–15.5)
WBC: 8.9 10*3/uL (ref 4.0–10.5)
nRBC: 0 % (ref 0.0–0.2)

## 2019-09-21 LAB — TYPE AND SCREEN
ABO/RH(D): A POS
Antibody Screen: NEGATIVE

## 2019-09-21 LAB — SURGICAL PCR SCREEN
MRSA, PCR: NEGATIVE
Staphylococcus aureus: NEGATIVE

## 2019-09-21 NOTE — Progress Notes (Signed)
Chart reviewed by Dr. Marcie Bal from anesthesia.

## 2019-09-21 NOTE — Progress Notes (Signed)
PCP - Dr. Lawernce Pitts  Cardiologist - [place]  Chest x-ray - 03/21/2019 (E)  EKG - 07/31/2019 (E)  Stress Test - Denies  ECHO - 05/28/2019 (E)  Cardiac Cath - 03/23/2019 (E)  AICD-na PM-na LOOP-na  Sleep Study - Yes- Positive CPAP - None  LABS- 09/21/2019: CBC, CMP, T/S, COVID  ASA- LD-12/16  ERAS- No  HA1C- Denies Fasting Blood Sugar -  Checks Blood Sugar _____ times a day  Anesthesia- Yes- cardiac history  Pt denies having chest pain, sob, or fever at this time. All instructions explained to the pt, with a verbal understanding of the material. Pt agrees to go over the instructions while at home for a better understanding. Pt also instructed to self quarantine after being tested for COVID-19. The opportunity to ask questions was provided.   Coronavirus Screening  Have you experienced the following symptoms:  Cough yes/no: No Fever (>100.92F)  yes/no: No Runny nose yes/no: No Sore throat yes/no: No Difficulty breathing/shortness of breath  yes/no: No  Have you or a family member traveled in the last 14 days and where? yes/no: No   If the patient indicates "YES" to the above questions, their PAT will be rescheduled to limit the exposure to others and, the surgeon will be notified. THE PATIENT WILL NEED TO BE ASYMPTOMATIC FOR 14 DAYS.   If the patient is not experiencing any of these symptoms, the PAT nurse will instruct them to NOT bring anyone with them to their appointment since they may have these symptoms or traveled as well.   Please remind your patients and families that hospital visitation restrictions are in effect and the importance of the restrictions.

## 2019-09-21 NOTE — Progress Notes (Signed)
[place], [place] [address] Phone: [phone] Fax: [facility] [id number] - [place], [place] - [address] AT [place] [address] [place] [address] Phone: [phone] Fax: [phone]              Your procedure is scheduled on Tues., Dec. 29, 2020 from 11:48AM-4:08PM            Report to [place] at [place].M.            Call this number if you have problems the morning of surgery:            [phone]             Remember:            Do not eat or drink after midnight.                                   Take these medicines the morning of surgery with A SIP OF WATER: Finasteride (Proscar) Hydromorphone (Dilaudid) Pregabalin (LYRICA)   As of today, taking any Aspirin (unless otherwise instructed by your surgeon), Aleve, Naproxen, Ibuprofen, Motrin, Advil, Goody's, BC's, all herbal medications, fish oil, and all vitamins. Including: Diclofenac (VOLTAREN)  No Smoking of any kind, Tobacco, or Alcohol products 24 hours prior to your procedure. If you use a Cpap at night, you may bring the machine and all equipment for your overnight stay.  [place]- Preparing For Surgery  Before surgery, you can play an important role. Because skin is not sterile, your skin needs to be as free of germs as possible. You can reduce the number of germs on your skin by washing with CHG (chlorahexidine gluconate) Soap before surgery.  CHG is an antiseptic cleaner which kills germs and bonds with the skin to continue killing germs even after washing.    Oral Hygiene is also important to reduce your risk of infection.  Remember - BRUSH YOUR TEETH THE MORNING OF SURGERY WITH YOUR REGULAR TOOTHPASTE  Please do not use if you have an allergy to CHG or antibacterial soaps. If your skin becomes reddened/irritated stop using the CHG.  Do not shave  (including legs and underarms) for at least 48 hours prior to first CHG shower. It is OK to shave your face.  Please follow these instructions carefully.                                                                                                                     1. Shower the NIGHT BEFORE SURGERY and the MORNING OF SURGERY with CHG.   2. If you chose to wash your hair, wash your hair first as usual with your normal shampoo.  3. After you shampoo, rinse your hair and body  thoroughly to remove the shampoo.  4. Use CHG as you would any other liquid soap. You can apply CHG directly to the skin and wash gently with a scrungie or a clean washcloth.   5. Apply the CHG Soap to your body ONLY FROM THE NECK DOWN.  Do not use on open wounds or open sores. Avoid contact with your eyes, ears, mouth and genitals (private parts). Wash Face and genitals (private parts)  with your normal soap.  6. Wash thoroughly, paying special attention to the area where your surgery will be performed.  7. Thoroughly rinse your body with warm water from the neck down.  8. DO NOT shower/wash with your normal soap after using and rinsing off the CHG Soap.  9. Pat yourself dry with a CLEAN TOWEL.  10. Wear CLEAN PAJAMAS to bed the night before surgery, wear comfortable clothes the morning of surgery  11. Place CLEAN SHEETS on your bed the night of your first shower and DO NOT SLEEP WITH PETS.  Day of Surgery: Remember to brush your teeth WITH YOUR REGULAR TOOTHPASTE.            Do not wear jewelry.            Do not wear lotions, powders, colognes, or deodorant.            Men may shave face and neck.            Do not bring valuables to the hospital.            [facility] is not responsible for any belongings or valuables.  Please wear clean clothes to the hospital/surgery center.     Contacts, dentures or bridgework may not be worn into surgery.  Leave your suitcase in the car.  After  surgery it may be brought to your room.  For patients admitted to the hospital, discharge time will be determined by your treatment team.  Patients discharged the day of surgery will not be allowed to drive home.

## 2019-09-22 ENCOUNTER — Inpatient Hospital Stay (HOSPITAL_COMMUNITY)
Admission: RE | Admit: 2019-09-22 | Discharge: 2019-09-24 | DRG: 460 | Disposition: A | Discharge status: Skilled Nursing Facility | Payer: Medicare Other | Attending: Neurosurgery | Admitting: Neurosurgery

## 2019-09-22 ENCOUNTER — Other Ambulatory Visit: Payer: Self-pay

## 2019-09-22 ENCOUNTER — Inpatient Hospital Stay (HOSPITAL_COMMUNITY): Payer: Medicare Other

## 2019-09-22 ENCOUNTER — Inpatient Hospital Stay (HOSPITAL_COMMUNITY)
Admission: RE | Disposition: A | Discharge status: Skilled Nursing Facility | Payer: Self-pay | Source: Home / Self Care | Attending: Neurosurgery

## 2019-09-22 ENCOUNTER — Inpatient Hospital Stay (HOSPITAL_COMMUNITY): Payer: Medicare Other | Admitting: Anesthesiology

## 2019-09-22 ENCOUNTER — Encounter (HOSPITAL_COMMUNITY): Payer: Self-pay | Admitting: Neurosurgery

## 2019-09-22 DIAGNOSIS — M48061 Spinal stenosis, lumbar region without neurogenic claudication: Secondary | ICD-10-CM

## 2019-09-22 DIAGNOSIS — T40605A Adverse effect of unspecified narcotics, initial encounter: Secondary | ICD-10-CM | POA: Diagnosis present

## 2019-09-22 DIAGNOSIS — Z419 Encounter for procedure for purposes other than remedying health state, unspecified: Secondary | ICD-10-CM

## 2019-09-22 DIAGNOSIS — M48062 Spinal stenosis, lumbar region with neurogenic claudication: Principal | ICD-10-CM | POA: Diagnosis present

## 2019-09-22 DIAGNOSIS — I10 Essential (primary) hypertension: Secondary | ICD-10-CM | POA: Diagnosis present

## 2019-09-22 DIAGNOSIS — Z791 Long term (current) use of non-steroidal anti-inflammatories (NSAID): Secondary | ICD-10-CM | POA: Diagnosis not present

## 2019-09-22 DIAGNOSIS — I252 Old myocardial infarction: Secondary | ICD-10-CM | POA: Diagnosis not present

## 2019-09-22 DIAGNOSIS — I2510 Atherosclerotic heart disease of native coronary artery without angina pectoris: Secondary | ICD-10-CM | POA: Diagnosis present

## 2019-09-22 DIAGNOSIS — Z7982 Long term (current) use of aspirin: Secondary | ICD-10-CM

## 2019-09-22 DIAGNOSIS — K5903 Drug induced constipation: Secondary | ICD-10-CM | POA: Diagnosis present

## 2019-09-22 DIAGNOSIS — Z87891 Personal history of nicotine dependence: Secondary | ICD-10-CM

## 2019-09-22 DIAGNOSIS — Z20828 Contact with and (suspected) exposure to other viral communicable diseases: Secondary | ICD-10-CM | POA: Diagnosis present

## 2019-09-22 DIAGNOSIS — Z79899 Other long term (current) drug therapy: Secondary | ICD-10-CM

## 2019-09-22 DIAGNOSIS — M5416 Radiculopathy, lumbar region: Secondary | ICD-10-CM | POA: Diagnosis present

## 2019-09-22 DIAGNOSIS — M419 Scoliosis, unspecified: Secondary | ICD-10-CM | POA: Diagnosis present

## 2019-09-22 DIAGNOSIS — Z981 Arthrodesis status: Secondary | ICD-10-CM | POA: Diagnosis not present

## 2019-09-22 HISTORY — PX: ANTERIOR LAT LUMBAR FUSION: SHX1168

## 2019-09-22 HISTORY — PX: LUMBAR PERCUTANEOUS PEDICLE SCREW 2 LEVEL: SHX5561

## 2019-09-22 HISTORY — DX: Spinal stenosis, lumbar region without neurogenic claudication: M48.061

## 2019-09-22 LAB — SARS CORONAVIRUS 2 (TAT 6-24 HRS): SARS Coronavirus 2: NEGATIVE

## 2019-09-22 SURGERY — ANTERIOR LATERAL LUMBAR FUSION 2 LEVELS
Anesthesia: General | Site: Spine Lumbar

## 2019-09-22 MED ORDER — MINERAL OIL PO OIL
108.0000 mL | TOPICAL_OIL | Freq: Every day | ORAL | Status: DC
  Administered 2019-09-24: 108 mL via ORAL
  Filled 2019-09-22 (×5): qty 120

## 2019-09-22 MED ORDER — BUPIVACAINE HCL (PF) 0.5 % IJ SOLN
INTRAMUSCULAR | Status: AC
  Filled 2019-09-22: qty 30

## 2019-09-22 MED ORDER — CHLORHEXIDINE GLUCONATE CLOTH 2 % EX PADS: 6.0000 | MEDICATED_PAD | Freq: Once | CUTANEOUS | Status: DC

## 2019-09-22 MED ORDER — LACTATED RINGERS IV SOLN: INTRAVENOUS | Status: DC

## 2019-09-22 MED ORDER — LIDOCAINE 2% (20 MG/ML) 5 ML SYRINGE
INTRAMUSCULAR | Status: DC | PRN
  Administered 2019-09-22: 100 mg via INTRAVENOUS

## 2019-09-22 MED ORDER — METHOCARBAMOL 1000 MG/10ML IJ SOLN
500.0000 mg | Freq: Four times a day (QID) | INTRAVENOUS | Status: DC | PRN
  Filled 2019-09-22: qty 5

## 2019-09-22 MED ORDER — FENTANYL CITRATE (PF) 250 MCG/5ML IJ SOLN
INTRAMUSCULAR | Status: DC | PRN
  Administered 2019-09-22 (×5): 50 ug via INTRAVENOUS

## 2019-09-22 MED ORDER — FENTANYL CITRATE (PF) 250 MCG/5ML IJ SOLN
INTRAMUSCULAR | Status: AC
  Filled 2019-09-22: qty 5

## 2019-09-22 MED ORDER — PROPOFOL 500 MG/50ML IV EMUL
INTRAVENOUS | Status: DC | PRN
  Administered 2019-09-22: 25 ug/kg/min via INTRAVENOUS

## 2019-09-22 MED ORDER — ACETAMINOPHEN 325 MG PO TABS: 325.0000 mg | ORAL_TABLET | ORAL | Status: DC | PRN

## 2019-09-22 MED ORDER — FENTANYL CITRATE (PF) 100 MCG/2ML IJ SOLN
INTRAMUSCULAR | Status: AC
  Filled 2019-09-22: qty 2

## 2019-09-22 MED ORDER — OXYCODONE HCL 5 MG/5ML PO SOLN: 5.0000 mg | Freq: Once | ORAL | Status: DC | PRN

## 2019-09-22 MED ORDER — LIDOCAINE-EPINEPHRINE 1 %-1:100000 IJ SOLN
INTRAMUSCULAR | Status: AC
  Filled 2019-09-22: qty 1

## 2019-09-22 MED ORDER — PREGABALIN 75 MG PO CAPS
150.0000 mg | ORAL_CAPSULE | Freq: Every day | ORAL | Status: DC
  Administered 2019-09-23 – 2019-09-24 (×2): 150 mg via ORAL
  Filled 2019-09-22 (×2): qty 2

## 2019-09-22 MED ORDER — ACETAMINOPHEN 160 MG/5ML PO SOLN: 325.0000 mg | ORAL | Status: DC | PRN

## 2019-09-22 MED ORDER — THROMBIN 5000 UNITS EX SOLR
CUTANEOUS | Status: AC
  Filled 2019-09-22: qty 5000

## 2019-09-22 MED ORDER — PANTOPRAZOLE SODIUM 40 MG IV SOLR: 40.0000 mg | Freq: Every day | INTRAVENOUS | Status: DC

## 2019-09-22 MED ORDER — PHENOL 1.4 % MT LIQD: 1.0000 | OROMUCOSAL | Status: DC | PRN

## 2019-09-22 MED ORDER — VITAMIN B-12 1000 MCG PO TABS
1000.0000 ug | ORAL_TABLET | Freq: Every day | ORAL | Status: DC
  Administered 2019-09-23 – 2019-09-24 (×2): 1000 ug via ORAL
  Filled 2019-09-22 (×3): qty 1

## 2019-09-22 MED ORDER — ONDANSETRON HCL 4 MG PO TABS: 4.0000 mg | ORAL_TABLET | Freq: Four times a day (QID) | ORAL | Status: DC | PRN

## 2019-09-22 MED ORDER — L-LYSINE 500 MG PO CAPS: 500.0000 mg | ORAL_CAPSULE | Freq: Every day | ORAL | Status: DC

## 2019-09-22 MED ORDER — LACTATED RINGERS IV SOLN: INTRAVENOUS | Status: DC | PRN

## 2019-09-22 MED ORDER — NORTRIPTYLINE HCL 25 MG PO CAPS
25.0000 mg | ORAL_CAPSULE | Freq: Every day | ORAL | Status: DC
  Administered 2019-09-22 – 2019-09-23 (×2): 25 mg via ORAL
  Filled 2019-09-22 (×4): qty 1

## 2019-09-22 MED ORDER — ACETAMINOPHEN 10 MG/ML IV SOLN
INTRAVENOUS | Status: AC
  Filled 2019-09-22: qty 100

## 2019-09-22 MED ORDER — METHOCARBAMOL 500 MG PO TABS: 500.0000 mg | ORAL_TABLET | Freq: Four times a day (QID) | ORAL | Status: DC | PRN

## 2019-09-22 MED ORDER — PROPOFOL 10 MG/ML IV BOLUS
INTRAVENOUS | Status: DC | PRN
  Administered 2019-09-22: 110 mg via INTRAVENOUS

## 2019-09-22 MED ORDER — PHENYLEPHRINE HCL-NACL 10-0.9 MG/250ML-% IV SOLN
INTRAVENOUS | Status: DC | PRN
  Administered 2019-09-22: 25 ug/min via INTRAVENOUS

## 2019-09-22 MED ORDER — ONDANSETRON HCL 4 MG/2ML IJ SOLN: 4.0000 mg | Freq: Once | INTRAMUSCULAR | Status: DC | PRN

## 2019-09-22 MED ORDER — DEXAMETHASONE SODIUM PHOSPHATE 10 MG/ML IJ SOLN
INTRAMUSCULAR | Status: DC | PRN
  Administered 2019-09-22: 10 mg via INTRAVENOUS

## 2019-09-22 MED ORDER — ASPIRIN EC 81 MG PO TBEC
81.0000 mg | DELAYED_RELEASE_TABLET | Freq: Every day | ORAL | Status: DC
  Administered 2019-09-22 – 2019-09-24 (×3): 81 mg via ORAL
  Filled 2019-09-22 (×3): qty 1

## 2019-09-22 MED ORDER — BUPIVACAINE HCL (PF) 0.5 % IJ SOLN
INTRAMUSCULAR | Status: DC | PRN
  Administered 2019-09-22: 11.5 mL
  Administered 2019-09-22: 7.5 mL

## 2019-09-22 MED ORDER — HYDROMORPHONE HCL 1 MG/ML IJ SOLN: 1.0000 mg | INTRAMUSCULAR | Status: DC | PRN

## 2019-09-22 MED ORDER — ONDANSETRON HCL 4 MG/2ML IJ SOLN: 4.0000 mg | Freq: Four times a day (QID) | INTRAMUSCULAR | Status: DC | PRN

## 2019-09-22 MED ORDER — ACETAMINOPHEN 325 MG PO TABS
650.0000 mg | ORAL_TABLET | ORAL | Status: DC | PRN
  Administered 2019-09-23: 650 mg via ORAL
  Filled 2019-09-22 (×3): qty 2

## 2019-09-22 MED ORDER — BUPIVACAINE LIPOSOME 1.3 % IJ SUSP
INTRAMUSCULAR | Status: DC | PRN
  Administered 2019-09-22: 20 mL

## 2019-09-22 MED ORDER — HYDROMORPHONE HCL 2 MG PO TABS
4.0000 mg | ORAL_TABLET | ORAL | Status: DC | PRN
  Administered 2019-09-22 – 2019-09-24 (×10): 4 mg via ORAL
  Filled 2019-09-22 (×10): qty 2

## 2019-09-22 MED ORDER — CEFAZOLIN SODIUM-DEXTROSE 2-4 GM/100ML-% IV SOLN
INTRAVENOUS | Status: AC
  Filled 2019-09-22: qty 100

## 2019-09-22 MED ORDER — ACETAMINOPHEN 650 MG RE SUPP: 650.0000 mg | RECTAL | Status: DC | PRN

## 2019-09-22 MED ORDER — ASCORBIC ACID 500 MG PO TABS
500.0000 mg | ORAL_TABLET | Freq: Every day | ORAL | Status: DC
  Administered 2019-09-22 – 2019-09-24 (×3): 500 mg via ORAL
  Filled 2019-09-22 (×3): qty 1

## 2019-09-22 MED ORDER — ALBUMIN HUMAN 5 % IV SOLN: INTRAVENOUS | Status: DC | PRN

## 2019-09-22 MED ORDER — FLUOCINONIDE 0.05 % EX CREA
1.0000 "application " | TOPICAL_CREAM | Freq: Every day | CUTANEOUS | Status: DC | PRN
  Filled 2019-09-22: qty 15

## 2019-09-22 MED ORDER — CEFAZOLIN SODIUM-DEXTROSE 2-4 GM/100ML-% IV SOLN
2.0000 g | Freq: Three times a day (TID) | INTRAVENOUS | Status: AC
  Administered 2019-09-22 – 2019-09-23 (×2): 2 g via INTRAVENOUS
  Filled 2019-09-22 (×2): qty 100

## 2019-09-22 MED ORDER — MEPERIDINE HCL 25 MG/ML IJ SOLN: 6.2500 mg | INTRAMUSCULAR | Status: DC | PRN

## 2019-09-22 MED ORDER — SAW PALMETTO (SERENOA REPENS) 80 MG PO CAPS: 240.0000 mg | ORAL_CAPSULE | Freq: Every day | ORAL | Status: DC

## 2019-09-22 MED ORDER — DOCUSATE SODIUM 100 MG PO CAPS
600.0000 mg | ORAL_CAPSULE | Freq: Every day | ORAL | Status: DC
  Administered 2019-09-23 – 2019-09-24 (×2): 600 mg via ORAL
  Filled 2019-09-22 (×2): qty 6

## 2019-09-22 MED ORDER — LOSARTAN POTASSIUM 25 MG PO TABS
25.0000 mg | ORAL_TABLET | Freq: Every day | ORAL | Status: DC
  Administered 2019-09-22 – 2019-09-23 (×2): 25 mg via ORAL
  Filled 2019-09-22 (×4): qty 1

## 2019-09-22 MED ORDER — HYDROMORPHONE HCL 1 MG/ML IJ SOLN
INTRAMUSCULAR | Status: AC
  Filled 2019-09-22: qty 1

## 2019-09-22 MED ORDER — 0.9 % SODIUM CHLORIDE (POUR BTL) OPTIME
TOPICAL | Status: DC | PRN
  Administered 2019-09-22: 1000 mL

## 2019-09-22 MED ORDER — FINASTERIDE 5 MG PO TABS
5.0000 mg | ORAL_TABLET | Freq: Every day | ORAL | Status: DC
  Administered 2019-09-23 – 2019-09-24 (×2): 5 mg via ORAL
  Filled 2019-09-22 (×2): qty 1

## 2019-09-22 MED ORDER — SUCCINYLCHOLINE CHLORIDE 200 MG/10ML IV SOSY
PREFILLED_SYRINGE | INTRAVENOUS | Status: DC | PRN
  Administered 2019-09-22: 140 mg via INTRAVENOUS

## 2019-09-22 MED ORDER — BISACODYL 10 MG RE SUPP
10.0000 mg | Freq: Every day | RECTAL | Status: DC | PRN
  Administered 2019-09-24: 10 mg via RECTAL

## 2019-09-22 MED ORDER — ALUM & MAG HYDROXIDE-SIMETH 200-200-20 MG/5ML PO SUSP: 30.0000 mL | Freq: Four times a day (QID) | ORAL | Status: DC | PRN

## 2019-09-22 MED ORDER — THROMBIN 5000 UNITS EX SOLR
OROMUCOSAL | Status: DC | PRN
  Administered 2019-09-22 (×2): 5 mL via TOPICAL

## 2019-09-22 MED ORDER — OXYCODONE HCL 5 MG PO TABS: 5.0000 mg | ORAL_TABLET | Freq: Once | ORAL | Status: DC | PRN

## 2019-09-22 MED ORDER — ZOLPIDEM TARTRATE 5 MG PO TABS: 5.0000 mg | ORAL_TABLET | Freq: Every evening | ORAL | Status: DC | PRN

## 2019-09-22 MED ORDER — HYDROMORPHONE HCL 1 MG/ML IJ SOLN
1.0000 mg | Freq: Once | INTRAMUSCULAR | Status: AC
  Administered 2019-09-22: 16:00:00 1 mg via INTRAVENOUS

## 2019-09-22 MED ORDER — ACETAMINOPHEN 10 MG/ML IV SOLN
INTRAVENOUS | Status: DC | PRN
  Administered 2019-09-22: 1000 mg via INTRAVENOUS

## 2019-09-22 MED ORDER — ONDANSETRON HCL 4 MG/2ML IJ SOLN
INTRAMUSCULAR | Status: DC | PRN
  Administered 2019-09-22: 4 mg via INTRAVENOUS

## 2019-09-22 MED ORDER — VITAMIN D 25 MCG (1000 UNIT) PO TABS
2000.0000 [IU] | ORAL_TABLET | Freq: Every day | ORAL | Status: DC
  Administered 2019-09-22 – 2019-09-24 (×3): 2000 [IU] via ORAL
  Filled 2019-09-22 (×4): qty 2

## 2019-09-22 MED ORDER — HYDROMORPHONE HCL 1 MG/ML IJ SOLN
INTRAMUSCULAR | Status: DC | PRN
  Administered 2019-09-22: .5 mg via INTRAVENOUS

## 2019-09-22 MED ORDER — DOCUSATE SODIUM 100 MG PO CAPS: 100.0000 mg | ORAL_CAPSULE | Freq: Two times a day (BID) | ORAL | Status: DC

## 2019-09-22 MED ORDER — LIDOCAINE-EPINEPHRINE 1 %-1:100000 IJ SOLN
INTRAMUSCULAR | Status: DC | PRN
  Administered 2019-09-22: 7.5 mL
  Administered 2019-09-22: 11.5 mL

## 2019-09-22 MED ORDER — FLEET ENEMA 7-19 GM/118ML RE ENEM: 1.0000 | ENEMA | Freq: Once | RECTAL | Status: DC | PRN

## 2019-09-22 MED ORDER — FENTANYL CITRATE (PF) 100 MCG/2ML IJ SOLN
25.0000 ug | INTRAMUSCULAR | Status: DC | PRN
  Administered 2019-09-22 (×3): 50 ug via INTRAVENOUS

## 2019-09-22 MED ORDER — KCL IN DEXTROSE-NACL 20-5-0.45 MEQ/L-%-% IV SOLN
INTRAVENOUS | Status: DC
  Filled 2019-09-22: qty 1000

## 2019-09-22 MED ORDER — SIMVASTATIN 20 MG PO TABS
40.0000 mg | ORAL_TABLET | Freq: Every day | ORAL | Status: DC
  Administered 2019-09-23: 40 mg via ORAL
  Filled 2019-09-22: qty 2

## 2019-09-22 MED ORDER — PANTOPRAZOLE SODIUM 40 MG PO TBEC
40.0000 mg | DELAYED_RELEASE_TABLET | Freq: Every day | ORAL | Status: DC
  Administered 2019-09-22 – 2019-09-24 (×3): 40 mg via ORAL
  Filled 2019-09-22 (×3): qty 1

## 2019-09-22 MED ORDER — DOCUSATE SODIUM 100 MG PO CAPS: 600.0000 mg | ORAL_CAPSULE | Freq: Every day | ORAL | Status: DC

## 2019-09-22 MED ORDER — CEFAZOLIN SODIUM-DEXTROSE 2-4 GM/100ML-% IV SOLN
2.0000 g | INTRAVENOUS | Status: AC
  Administered 2019-09-22: 2 g via INTRAVENOUS

## 2019-09-22 MED ORDER — VITAMIN B-6 100 MG PO TABS
100.0000 mg | ORAL_TABLET | Freq: Every day | ORAL | Status: DC
  Administered 2019-09-23 – 2019-09-24 (×2): 100 mg via ORAL
  Filled 2019-09-22 (×3): qty 1

## 2019-09-22 MED ORDER — HYDROXYZINE HCL 50 MG/ML IM SOLN: 50.0000 mg | Freq: Four times a day (QID) | INTRAMUSCULAR | Status: DC | PRN

## 2019-09-22 MED ORDER — SODIUM CHLORIDE 0.9% FLUSH
3.0000 mL | Freq: Two times a day (BID) | INTRAVENOUS | Status: DC
  Administered 2019-09-23 (×2): 3 mL via INTRAVENOUS

## 2019-09-22 MED ORDER — SODIUM CHLORIDE 0.9% FLUSH: 3.0000 mL | INTRAVENOUS | Status: DC | PRN

## 2019-09-22 MED ORDER — DICLOFENAC SODIUM 75 MG PO TBEC
75.0000 mg | DELAYED_RELEASE_TABLET | Freq: Two times a day (BID) | ORAL | Status: DC
  Administered 2019-09-22 – 2019-09-24 (×4): 75 mg via ORAL
  Filled 2019-09-22 (×6): qty 1

## 2019-09-22 MED ORDER — ADULT MULTIVITAMIN W/MINERALS CH
1.0000 | ORAL_TABLET | Freq: Every day | ORAL | Status: DC
  Administered 2019-09-23 – 2019-09-24 (×2): 1 via ORAL
  Filled 2019-09-22 (×2): qty 1

## 2019-09-22 MED ORDER — MENTHOL 3 MG MT LOZG: 1.0000 | LOZENGE | OROMUCOSAL | Status: DC | PRN

## 2019-09-22 MED ORDER — PHENYLEPHRINE HCL (PRESSORS) 10 MG/ML IV SOLN
INTRAVENOUS | Status: AC
  Filled 2019-09-22: qty 1

## 2019-09-22 MED ORDER — BUPIVACAINE LIPOSOME 1.3 % IJ SUSP
20.0000 mL | Freq: Once | INTRAMUSCULAR | Status: DC
  Filled 2019-09-22 (×2): qty 20

## 2019-09-22 MED ORDER — HYDROMORPHONE HCL 1 MG/ML IJ SOLN
INTRAMUSCULAR | Status: AC
  Filled 2019-09-22: qty 0.5

## 2019-09-22 MED ORDER — TIZANIDINE HCL 4 MG PO TABS
4.0000 mg | ORAL_TABLET | Freq: Three times a day (TID) | ORAL | Status: DC
  Administered 2019-09-22 – 2019-09-24 (×6): 4 mg via ORAL
  Filled 2019-09-22 (×6): qty 1

## 2019-09-22 MED ORDER — POLYETHYLENE GLYCOL 3350 17 G PO PACK: 17.0000 g | PACK | Freq: Every day | ORAL | Status: DC | PRN

## 2019-09-22 MED ORDER — OXYCODONE HCL 5 MG PO TABS
5.0000 mg | ORAL_TABLET | ORAL | Status: DC | PRN
  Administered 2019-09-23 (×2): 5 mg via ORAL
  Filled 2019-09-22 (×2): qty 1

## 2019-09-22 MED ORDER — POLYETHYLENE GLYCOL 3350 17 G PO PACK
17.0000 g | PACK | Freq: Every day | ORAL | Status: DC
  Administered 2019-09-22 – 2019-09-24 (×3): 17 g via ORAL
  Filled 2019-09-22 (×2): qty 1

## 2019-09-22 SURGICAL SUPPLY — 82 items
BLADE CLIPPER SURG (BLADE) IMPLANT
CARTRIDGE OIL MAESTRO DRILL (MISCELLANEOUS) ×1 IMPLANT
CLIP NEUROVISION LG (CLIP) ×2 IMPLANT
CONT SPEC 4OZ CLIKSEAL STRL BL (MISCELLANEOUS) ×2 IMPLANT
COVER BACK TABLE 60X90IN (DRAPES) ×2 IMPLANT
COVER WAND RF STERILE (DRAPES) ×4 IMPLANT
DECANTER SPIKE VIAL GLASS SM (MISCELLANEOUS) ×2 IMPLANT
DERMABOND ADVANCED (GAUZE/BANDAGES/DRESSINGS) ×2
DERMABOND ADVANCED .7 DNX12 (GAUZE/BANDAGES/DRESSINGS) ×2 IMPLANT
DIFFUSER DRILL AIR PNEUMATIC (MISCELLANEOUS) ×2 IMPLANT
DRAPE C-ARM 42X72 X-RAY (DRAPES) ×4 IMPLANT
DRAPE C-ARMOR (DRAPES) ×4 IMPLANT
DRAPE LAPAROTOMY 100X72X124 (DRAPES) ×4 IMPLANT
DRAPE SURG 17X23 STRL (DRAPES) ×2 IMPLANT
DRSG OPSITE POSTOP 3X4 (GAUZE/BANDAGES/DRESSINGS) ×4 IMPLANT
DRSG OPSITE POSTOP 4X8 (GAUZE/BANDAGES/DRESSINGS) ×4 IMPLANT
DURAPREP 26ML APPLICATOR (WOUND CARE) ×4 IMPLANT
ELECT REM PT RETURN 9FT ADLT (ELECTROSURGICAL) ×4
ELECTRODE REM PT RTRN 9FT ADLT (ELECTROSURGICAL) ×2 IMPLANT
GAUZE 4X4 16PLY RFD (DISPOSABLE) IMPLANT
GAUZE SPONGE 4X4 12PLY STRL (GAUZE/BANDAGES/DRESSINGS) IMPLANT
GAUZE SPONGE 4X4 16PLY XRAY LF (GAUZE/BANDAGES/DRESSINGS) ×2 IMPLANT
GLOVE BIO SURGEON STRL SZ 6.5 (GLOVE) ×6 IMPLANT
GLOVE BIO SURGEON STRL SZ8 (GLOVE) ×6 IMPLANT
GLOVE BIOGEL PI IND STRL 6.5 (GLOVE) ×1 IMPLANT
GLOVE BIOGEL PI IND STRL 7.0 (GLOVE) ×2 IMPLANT
GLOVE BIOGEL PI IND STRL 7.5 (GLOVE) ×2 IMPLANT
GLOVE BIOGEL PI IND STRL 8 (GLOVE) ×2 IMPLANT
GLOVE BIOGEL PI IND STRL 8.5 (GLOVE) ×3 IMPLANT
GLOVE BIOGEL PI INDICATOR 6.5 (GLOVE) ×1
GLOVE BIOGEL PI INDICATOR 7.0 (GLOVE) ×2
GLOVE BIOGEL PI INDICATOR 7.5 (GLOVE) ×2
GLOVE BIOGEL PI INDICATOR 8 (GLOVE) ×2
GLOVE BIOGEL PI INDICATOR 8.5 (GLOVE) ×3
GLOVE ECLIPSE 8.0 STRL XLNG CF (GLOVE) ×4 IMPLANT
GLOVE EXAM NITRILE XL STR (GLOVE) IMPLANT
GLOVE SURG SS PI 7.0 STRL IVOR (GLOVE) ×4 IMPLANT
GOWN STRL REUS W/ TWL LRG LVL3 (GOWN DISPOSABLE) ×2 IMPLANT
GOWN STRL REUS W/ TWL XL LVL3 (GOWN DISPOSABLE) ×2 IMPLANT
GOWN STRL REUS W/TWL 2XL LVL3 (GOWN DISPOSABLE) ×4 IMPLANT
GOWN STRL REUS W/TWL LRG LVL3 (GOWN DISPOSABLE) ×2
GOWN STRL REUS W/TWL XL LVL3 (GOWN DISPOSABLE) ×2
GUIDEWIRE NITINOL BEVEL TIP (WIRE) ×8 IMPLANT
HEMOSTAT POWDER KIT SURGIFOAM (HEMOSTASIS) ×4 IMPLANT
KIT BASIN OR (CUSTOM PROCEDURE TRAY) ×2 IMPLANT
KIT DILATOR XLIF 5 (KITS) ×2 IMPLANT
KIT INFUSE X SMALL 1.4CC (Orthopedic Implant) ×2 IMPLANT
KIT POSITION SURG JACKSON T1 (MISCELLANEOUS) ×2 IMPLANT
KIT SURGICAL ACCESS MAXCESS 4 (KITS) ×2 IMPLANT
KIT TURNOVER KIT B (KITS) ×4 IMPLANT
MARKER SKIN DUAL TIP RULER LAB (MISCELLANEOUS) ×2 IMPLANT
MODULE NVM5 NEXT GEN EMG (NEEDLE) ×2 IMPLANT
MODULUS XLW 10X22X55MM 10 (Spine Construct) ×2 IMPLANT
MODULUS XLW 12X22X55MM 10 (Spine Construct) ×2 IMPLANT
NEEDLE HYPO 21X1.5 SAFETY (NEEDLE) ×2 IMPLANT
NEEDLE HYPO 25X1 1.5 SAFETY (NEEDLE) ×4 IMPLANT
NEEDLE I PASS (NEEDLE) ×2 IMPLANT
NS IRRIG 1000ML POUR BTL (IV SOLUTION) ×2 IMPLANT
OIL CARTRIDGE MAESTRO DRILL (MISCELLANEOUS) ×2
PACK LAMINECTOMY NEURO (CUSTOM PROCEDURE TRAY) ×4 IMPLANT
PAD ARMBOARD 7.5X6 YLW CONV (MISCELLANEOUS) ×6 IMPLANT
PATTIES SURGICAL .5 X.5 (GAUZE/BANDAGES/DRESSINGS) IMPLANT
PATTIES SURGICAL .5 X1 (DISPOSABLE) IMPLANT
PATTIES SURGICAL 1X1 (DISPOSABLE) IMPLANT
PUTTY BONE ATTRAX 5CC STRIP (Putty) ×4 IMPLANT
ROD RELINE MAS LORD 5.5X85MM (Rod) ×2 IMPLANT
ROD SPINAL 5.5X80 TI LORDOSE (Rod) ×2 IMPLANT
SCREW LOCK RELINE 5.5 TULIP (Screw) ×12 IMPLANT
SCREW MAS RELINE 6.5X50 POLY (Screw) ×8 IMPLANT
SCREW MAS RELINE 6.5X55 POLY (Screw) ×4 IMPLANT
SPONGE LAP 4X18 RFD (DISPOSABLE) IMPLANT
STAPLER SKIN PROX WIDE 3.9 (STAPLE) ×2 IMPLANT
SUT VIC AB 1 CT1 18XBRD ANBCTR (SUTURE) ×2 IMPLANT
SUT VIC AB 1 CT1 8-18 (SUTURE) ×2
SUT VIC AB 2-0 CT1 18 (SUTURE) ×6 IMPLANT
SUT VIC AB 3-0 SH 8-18 (SUTURE) ×10 IMPLANT
SYR 20ML LL LF (SYRINGE) ×2 IMPLANT
SYR TB 1ML 25GX5/8 (SYRINGE) IMPLANT
TOWEL GREEN STERILE (TOWEL DISPOSABLE) ×2 IMPLANT
TOWEL GREEN STERILE FF (TOWEL DISPOSABLE) ×2 IMPLANT
TRAY FOLEY MTR SLVR 16FR STAT (SET/KITS/TRAYS/PACK) ×2 IMPLANT
WATER STERILE IRR 1000ML POUR (IV SOLUTION) ×2 IMPLANT

## 2019-09-22 NOTE — Progress Notes (Signed)
Awake, alert, conversant.  Sore in back.  Strength full in both legs.  Doing well.

## 2019-09-22 NOTE — Anesthesia Preprocedure Evaluation (Addendum)
Anesthesia Evaluation  Patient identified by MRN, date of birth, ID band Patient awake    Reviewed: Allergy & Precautions, H&P , NPO status , Patient's Chart, lab work & pertinent test results  Airway Mallampati: III   Neck ROM: Limited  Mouth opening: Limited Mouth Opening Comment: VERY LIMITED ROM 2nd to fusion Dental  (+) Teeth Intact, Dental Advisory Given   Pulmonary sleep apnea and Continuous Positive Airway Pressure Ventilation , former smoker,    breath sounds clear to auscultation       Cardiovascular hypertension, + CAD and + Past MI  + dysrhythmias  Rhythm:regular Rate:Normal  ECHO 6/20'  Left Ventricle: The left ventricle has normal systolic function, with an ejection fraction of 60-65%. The cavity size was normal. Left ventricular diastolic Doppler parameters are consistent with impaired relaxation (grade I). Indeterminate filling pressures  CATH 6/20 No angiographic evidence of CAD   Neuro/Psych  Neuromuscular disease negative psych ROS   GI/Hepatic negative GI ROS, Heme positive stool   Endo/Other  negative endocrine ROS  Renal/GU Renal InsufficiencyRenal disease  negative genitourinary   Musculoskeletal  (+) Arthritis , Osteoarthritis,    Abdominal   Peds negative pediatric ROS (+)  Hematology  (+) Blood dyscrasia, anemia ,   Anesthesia Other Findings   Reproductive/Obstetrics negative OB ROS                            Anesthesia Physical  Anesthesia Plan  ASA: III  Anesthesia Plan: General   Post-op Pain Management:    Induction: Intravenous  PONV Risk Score and Plan: 2 and Ondansetron, Treatment may vary due to age or medical condition and Dexamethasone  Airway Management Planned: Oral ETT and Video Laryngoscope Planned  Additional Equipment:   Intra-op Plan:   Post-operative Plan: Extubation in OR  Informed Consent: I have reviewed the patients History  and Physical, chart, labs and discussed the procedure including the risks, benefits and alternatives for the proposed anesthesia with the patient or authorized representative who has indicated his/her understanding and acceptance.       Plan Discussed with: CRNA, Anesthesiologist and Surgeon  Anesthesia Plan Comments: (  )       Anesthesia Quick Evaluation

## 2019-09-22 NOTE — Interval H&P Note (Signed)
History and Physical Interval Note:  09/22/2019 10:04 AM  Aaron Schmidt  has presented today for surgery, with the diagnosis of Lumbar foraminal stenosis.  The various methods of treatment have been discussed with the patient and family. After consideration of risks, benefits and other options for treatment, the patient has consented to  Procedure(s) with comments: Lumbar 1-2, Lumbar 2-3 Anterolateral lumbar interbody fusion with pedicle screw fixation and exploration of adjacent fusion (N/A) - Lumbar 1-2, Lumbar 2-3 Anterolateral lumbar interbody fusion with pedicle screw fixation and exploration of adjacent fusion LUMBAR PERCUTANEOUS PEDICLE SCREW 2 LEVEL (N/A) as a surgical intervention.  The patient's history has been reviewed, patient examined, no change in status, stable for surgery.  I have reviewed the patient's chart and labs.  Questions were answered to the patient's satisfaction.     Peggyann Shoals

## 2019-09-22 NOTE — Progress Notes (Signed)
PHARMACIST - PHYSICIAN ORDER COMMUNICATION  CONCERNING: P&T Medication Policy on Herbal Medications  DESCRIPTION:  This patient's orders for:  L-lysine and saw palmetto  has been noted.  This product(s) is classified as an "herbal" or natural product. Due to a lack of definitive safety studies or FDA approval, nonstandard manufacturing practices, plus the potential risk of unknown drug-drug interactions while on inpatient medications, the Pharmacy and Therapeutics Committee does not permit the use of "herbal" or natural products of this type within [facility].   ACTION TAKEN: The pharmacy department is unable to verify this order at this time and your patient has been informed of this safety policy. Please reevaluate patient's clinical condition at discharge and address if the herbal or natural product(s) should be resumed at that time.  Kari Kerth A. Levada Dy, PharmD, BCPS, FNKF Clinical Pharmacist [place] Please utilize Amion for appropriate phone number to reach the unit pharmacist ([place])

## 2019-09-22 NOTE — H&P (Signed)
Patient ID:   [id number] Patient: Aaron Schmidt  Date of Birth: Apr 29, 1933 Visit Type: Office Visit   Date: 08/31/2019 11:15 AM Provider: Marchia Meiers. Vertell Limber MD   This 85 year old male presents for back pain.  HISTORY OF PRESENT ILLNESS:  1.  back pain  Patient returns to review his MRI  Patient continues to complain of severe persistent pain.  His pain level is 8/10 in severity.  He is getting some relief with the increased hydromorphone.  His MRI and radiographs demonstrate significant degeneration and stenosis with worsening disc protrusions at the L1-2 and L2-3 levels.  I have recommended XL IF at the L1-2 and L2-3 levels with exploration of prior fusion and with pedicle screw fixation L1 through L3 levels.         Medical/Surgical/Interim History Reviewed, no change.  Last detailed document date:07/03/2019.     PAST MEDICAL HISTORY, SURGICAL HISTORY, FAMILY HISTORY, SOCIAL HISTORY AND REVIEW OF SYSTEMS I have reviewed the patient's past medical, surgical, family and social history as well as the comprehensive review of systems as included on the [facility] history form dated 04/03/2018, which I have signed.  Family History:  Reviewed, no changes.  Last detailed document date:01/01/2018.   Social History: Reviewed, no changes. Last detailed document date: 07/15/2013.    MEDICATIONS: (added, continued or stopped this visit) Started Medication Directions Instruction Stopped   aspirin 81 mg chewable tablet chew 1 tablet by oral route  every day    03/24/2019 diclofenac 1 % topical gel     08/19/2019 diclofenac sodium 75 mg tablet,delayed release take 1 tablet by oral route 2 times every day    08/26/2019 Dilaudid 2 mg tablet take 1-2 tablets q 6 hours as needed for chronic pain ( DNF UNTIL 08/25/2019) **Pt to call when needed**    10/21/2018 Dilaudid 2 mg tablet take 1 tablet by oral route 4  times every day as needed (DNF 01/08/19)     07/03/2019 Dilaudid 2 mg tablet take 1-2 tablets q 6 hours as needed for chronic pain ( DNF UNTIL 07/26/2019) DNF UNTIL 07/26/2019. ( Pt to call for refill in November)    finasteride 5 mg tablet take 1 tablet by oral route  every day     flaxseed oil 1,000 mg capsule      Iron (ferrous sulfate) 325 mg (65 mg iron) tablet take 1 tablet by oral route 2 times every day     L-Lysine 500 mg capsule      losartan 25 mg tablet take 1 tablet by oral route  every day     Lyrica 150 mg capsule      mature multi  BUCCAL     08/04/2019 Medrol (Pak) 4 mg tablets in a dose pack take by oral route as directed per package instructions     mineral oil oral      Miralax 17 gram/dose oral powder take (17G)  by oral route  every day mixed with 8 oz. water, juice, soda, coffee or tea     nortriptyline 25 mg capsule take 2 capsule by oral route  every day     Perdiem Overnight Relief 15 mg tablet take 2 tablet by oral route  every day     saw palmetto 80 mg capsule      senna 8.6 mg tablet take 2 tablet by oral route  every day     simvastatin 40 mg tablet take 1 tablet by oral route  every day in the evening     Stool Softener  ORAL     10/21/2018 tizanidine 2 mg tablet TAKE 1 TABLET BY MOUTH  EVERY 8 HOURS AS NEEDED NOT TO EXCEED 3 DOSES/24 HOURS (90 day supply)     Vitamin B Complex With C capsule      Vitamin B-6 100 mg tablet      Vitamin C 500 mg tablet      Vitamin D3 2,000 unit capsule        ALLERGIES: Ingredient Reaction Medication Name Comment  DIAZEPAM  Valium    Reviewed, no changes.    PHYSICAL EXAM:   Vitals Date Temp F BP Pulse Ht In Wt Lb BMI BSA Pain Score  08/31/2019 96.9 114/47 75 70 178.6 25.63  8/10      IMPRESSION:   Proceed with L1-2 and L2-3 XL IF with exploration of prior fusion and with pedicle screw fixation L1 through L3 levels.  PLAN:  Risks and benefits of surgery discussed with patient the patient he wishes to go ahead as soon as possible.  Refill of  hydromorphone was given today.  Orders: Diagnostic Procedures: Assessment Procedure  M54.16 Lumbar Spine- AP/Lat  Z13.828 Scoliosis- AP/Lat  Instruction(s)/Education: Assessment Instruction  [phone] Dietary management education, guidance, and counseling   Completed Orders (this encounter) Order Details Reason Side Interpretation Result Initial Treatment Date Region  Scoliosis- AP/Lat      08/31/2019 All Levels to All Levels  Dietary management education, guidance, and counseling Encouraged patient to eat well balanced diet.         Assessment/Plan   # Detail Type Description   1. Assessment Lumbar pain (M54.5).       2. Assessment Lumbar foraminal stenosis (M48.061).       3. Assessment Lumbar stenosis with neurogenic claudication (M48.062).       4. Assessment Lumbar radiculopathy (M54.16).       5. Assessment Scoliosis concern [id number]).       6. Assessment Body mass index (BMI) 25.0-25.9, adult [id number]).   Plan Orders Today's instructions / counseling include(s) Dietary management education, guidance, and counseling. Clinical information/comments: Encouraged patient to eat well balanced diet.         Pain Management Plan Pain Scale: 8/10. Method: Numeric Pain Intensity Scale. Location: back. Onset: 02/20/2017. Duration: varies. Quality: discomforting. Pain management follow-up plan of care: Patient will continue medication management.              Provider:  Marchia Meiers. Vertell Limber MD  09/05/2019 06:49 PM    Dictation edited by: Marchia Meiers. Vertell Limber    CC Providers: Hulan Fess [place],  [place]  [id number]   [place]  [facility] [address] [place], [place] [address]-               Electronically signed by Marchia Meiers Vertell Limber MD on 09/05/2019 06:49 PM

## 2019-09-22 NOTE — Transfer of Care (Signed)
Immediate Anesthesia Transfer of Care Note  Patient: Aaron Schmidt  Procedure(s) Performed: Lumbar one-two, Lumbar two-three Anterolateral lumbar interbody fusion with pedicle screw fixation and exploration of adjacent fusion (N/A Spine Lumbar) LUMBAR PERCUTANEOUS PEDICLE SCREW TWO LEVEL (N/A Spine Lumbar)  Patient Location: PACU  Anesthesia Type:General  Level of Consciousness: awake, drowsy and patient cooperative  Airway & Oxygen Therapy: Patient Spontanous Breathing and Patient connected to face mask oxygen  Post-op Assessment: Report given to RN and Post -op Vital signs reviewed and stable  Post vital signs: Reviewed and stable  Last Vitals:  Vitals Value Taken Time  BP 135/77 09/22/19 1530  Temp    Pulse 70 09/22/19 1532  Resp 14 09/22/19 1532  SpO2 100 % 09/22/19 1532  Vitals shown include unvalidated device data.  Last Pain:  Vitals:   09/22/19 1021  TempSrc:   PainSc: 0-No pain         Complications: No apparent anesthesia complications

## 2019-09-22 NOTE — Brief Op Note (Signed)
09/22/2019  3:40 PM  PATIENT:  Aaron Schmidt  85 y.o. male  PRE-OPERATIVE DIAGNOSIS:  Lumbar foraminal stenosis, degenerative spinal stenosis, lumbago, radiculopathy L 12, L 23 levels  POST-OPERATIVE DIAGNOSIS:  Lumbar foraminal stenosis, degenerative spinal stenosis, lumbago, radiculopathy L 12, L 23 levels   PROCEDURE:  Procedure(s): Lumbar one-two, Lumbar two-three Anterolateral lumbar interbody fusion with pedicle screw fixation and exploration of adjacent fusion (N/A) LUMBAR PERCUTANEOUS PEDICLE SCREW TWO LEVEL (N/A)   SURGEON:  Surgeon(s) and Role:    Erline Levine, MD - Primary    * Kary Kos, MD - Assisting  PHYSICIAN ASSISTANT:   ASSISTANTS:Poteat, RN  ANESTHESIA:   general  EBL:  100 mL   BLOOD ADMINISTERED:none  DRAINS: none   LOCAL MEDICATIONS USED:  MARCAINE    and LIDOCAINE   SPECIMEN:  No Specimen  DISPOSITION OF SPECIMEN:  N/A  COUNTS:  YES  TOURNIQUET:  * No tourniquets in log *  DICTATION: DICTATION: Patient is a 85 year old with severe spondylosis stenosis radiculopathy of the lumbar spine. It was elected to take him to surgery for anterolateral decompression and posterior pedicle screw fixation.  The patient has prior fusion at L 3 - S 1 levels.  Procedure: Patient was brought to the operating room and placed in a left lateral decubitus position on the operative table and using orthogonally projected C-arm fluoroscopy the patient was placed so that the L 12,  L 23  levels were visualized in AP and lateral plane. The patient was then taped into position. The table was flexed. Skin was marked along with a posterior finger dissection incision. His flank was then prepped and draped in usual sterile fashion and incisions were made sequentially at  L2-3 levels. Posterior finger dissection was made to enter the retroperitoneal space and then subsequently the probe was inserted into the psoas muscle from the right side initially at the L 23 level. After  mapping the neural elements were able to dock the probe per the midpoint of this vertebral level and without indications electrically of too close proximity to the neural tissues. Subsequently the self-retaining tractor was.after sequential dilators were utilized the shim was employed and the interspace was cleared of psoas muscle and then incised. A thorough discectomy was performed. Instruments were used to clear the interspace of disc material.   After thorough discectomy was performed and this was performed using AP and lateral fluoroscopy a 12 lordotic by 55 x 22 mm implant was packed with extra small BMP and Attrax. This was tamped into position  and its position was confirmed on AP and lateral fluoroscopy. Subsequently exposure was performed at the L12  level and similar dissection was performed with locking of the self-retaining retractor. At this level were able to place a 10 lordotic x 22 x 55 mm implant packed in a similar fashion.  Hemostasis was assured the wounds were irrigated and closed with interrupted Vicryl sutures.  Sterile occlusive dressings were placed. Retractor times were:   L 23: 24 minutes; L 12: 27 minutes.   Patient was then turned into a prone position on the operating table on Jackson table and using AP and lateral fluoroscopy throughout this portion of the procedure, pedicle screws were placed using Reline Nuvasive cannulated percutaneous screws. First, a midline exposure of previously placed pedicle screws at L 3 - L 4 were exposed and removed.  There was bridging bone over this hardware and all screws were securely in bone without loosening.  There was  clearly evident fusion at this level.  Lateral entry points were created at L 3 bilaterally and new screws were placed bilaterally at this level (6.5 x 55 mm).   2 screws were placed at L2 and (6.5 x 50 mm) and 2 at L 1 (6.5 x 50). 80 mm rod was then affixed to the screw heads do a separate stab incision and locked down on the  screws on the left and 85 mm rod on the right. All connections were then torqued and the Towers were disassembled. The wounds were irrigated and then closed with 1, 2-0 and 3-0 Vicryl stitches. Sterile occlusive dressing was placed with Dermabond. Long-acting Marcaine was injected. The patient was then extubated in the operating room and taken to recovery in stable and satisfactory condition having tolerated his operation well. Counts were correct at the end of the case.  Pelvic Parameters:  Preop: PT 10; PI 30; LL-28; PI-LL +3; SVA 49   PLAN OF CARE: Admit to inpatient   PATIENT DISPOSITION:  PACU - hemodynamically stable.   Delay start of Pharmacological VTE agent (>24hrs) due to surgical blood loss or risk of bleeding: yes

## 2019-09-22 NOTE — Op Note (Signed)
09/22/2019  3:40 PM  PATIENT:  Aaron Schmidt  85 y.o. male  PRE-OPERATIVE DIAGNOSIS:  Lumbar foraminal stenosis, degenerative spinal stenosis, lumbago, radiculopathy L 12, L 23 levels  POST-OPERATIVE DIAGNOSIS:  Lumbar foraminal stenosis, degenerative spinal stenosis, lumbago, radiculopathy L 12, L 23 levels   PROCEDURE:  Procedure(s): Lumbar one-two, Lumbar two-three Anterolateral lumbar interbody fusion with pedicle screw fixation and exploration of adjacent fusion (N/A) LUMBAR PERCUTANEOUS PEDICLE SCREW TWO LEVEL (N/A)   SURGEON:  Surgeon(s) and Role:    Erline Levine, MD - Primary    * Kary Kos, MD - Assisting  PHYSICIAN ASSISTANT:   ASSISTANTS:Poteat, RN  ANESTHESIA:   general  EBL:  100 mL   BLOOD ADMINISTERED:none  DRAINS: none   LOCAL MEDICATIONS USED:  MARCAINE    and LIDOCAINE   SPECIMEN:  No Specimen  DISPOSITION OF SPECIMEN:  N/A  COUNTS:  YES  TOURNIQUET:  * No tourniquets in log *  DICTATION: DICTATION: Patient is a 85 year old with severe spondylosis stenosis radiculopathy of the lumbar spine. It was elected to take him to surgery for anterolateral decompression and posterior pedicle screw fixation.  The patient has prior fusion at L 3 - S 1 levels.  Procedure: Patient was brought to the operating room and placed in a left lateral decubitus position on the operative table and using orthogonally projected C-arm fluoroscopy the patient was placed so that the L 12,  L 23  levels were visualized in AP and lateral plane. The patient was then taped into position. The table was flexed. Skin was marked along with a posterior finger dissection incision. His flank was then prepped and draped in usual sterile fashion and incisions were made sequentially at  L2-3 levels. Posterior finger dissection was made to enter the retroperitoneal space and then subsequently the probe was inserted into the psoas muscle from the right side initially at the L 23 level. After  mapping the neural elements were able to dock the probe per the midpoint of this vertebral level and without indications electrically of too close proximity to the neural tissues. Subsequently the self-retaining tractor was.after sequential dilators were utilized the shim was employed and the interspace was cleared of psoas muscle and then incised. A thorough discectomy was performed. Instruments were used to clear the interspace of disc material.   After thorough discectomy was performed and this was performed using AP and lateral fluoroscopy a 12 lordotic by 55 x 22 mm implant was packed with extra small BMP and Attrax. This was tamped into position  and its position was confirmed on AP and lateral fluoroscopy. Subsequently exposure was performed at the L12  level and similar dissection was performed with locking of the self-retaining retractor. At this level were able to place a 10 lordotic x 22 x 55 mm implant packed in a similar fashion.  Hemostasis was assured the wounds were irrigated and closed with interrupted Vicryl sutures.  Sterile occlusive dressings were placed. Retractor times were:   L 23: 24 minutes; L 12: 27 minutes.   Patient was then turned into a prone position on the operating table on Jackson table and using AP and lateral fluoroscopy throughout this portion of the procedure, pedicle screws were placed using Reline Nuvasive cannulated percutaneous screws. First, a midline exposure of previously placed pedicle screws at L 3 - L 4 were exposed and removed.  There was bridging bone over this hardware and all screws were securely in bone without loosening.  There was  clearly evident fusion at this level.  Lateral entry points were created at L 3 bilaterally and new screws were placed bilaterally at this level (6.5 x 55 mm).   2 screws were placed at L2 and (6.5 x 50 mm) and 2 at L 1 (6.5 x 50). 80 mm rod was then affixed to the screw heads do a separate stab incision and locked down on the  screws on the left and 85 mm rod on the right. All connections were then torqued and the Towers were disassembled. The wounds were irrigated and then closed with 1, 2-0 and 3-0 Vicryl stitches. Sterile occlusive dressing was placed with Dermabond. Long-acting Marcaine was injected. The patient was then extubated in the operating room and taken to recovery in stable and satisfactory condition having tolerated her operation well. Counts were correct at the end of the case.  Pelvic Parameters:  Preop: PT 10; PI 30; LL-28; PI-LL +3; SVA 49   PLAN OF CARE: Admit to inpatient   PATIENT DISPOSITION:  PACU - hemodynamically stable.   Delay start of Pharmacological VTE agent (>24hrs) due to surgical blood loss or risk of bleeding: yes

## 2019-09-22 NOTE — Anesthesia Procedure Notes (Signed)
Procedure Name: Intubation Date/Time: 09/22/2019 11:18 AM Performed by: Renato Shin, CRNA Pre-anesthesia Checklist: Patient identified, Emergency Drugs available, Suction available and Patient being monitored Patient Re-evaluated:Patient Re-evaluated prior to induction Oxygen Delivery Method: Circle system utilized Preoxygenation: Pre-oxygenation with 100% oxygen Induction Type: IV induction Ventilation: Mask ventilation without difficulty Laryngoscope Size: Glidescope and 4 Grade View: Grade I Tube type: Oral Tube size: 7.5 mm Number of attempts: 1 Airway Equipment and Method: Oral airway,  Rigid stylet and Video-laryngoscopy Placement Confirmation: ETT inserted through vocal cords under direct vision,  positive ETCO2 and breath sounds checked- equal and bilateral Secured at: 21 cm Tube secured with: Tape Dental Injury: Teeth and Oropharynx as per pre-operative assessment

## 2019-09-23 ENCOUNTER — Encounter: Payer: Self-pay | Admitting: *Deleted

## 2019-09-23 MED ORDER — OXYCODONE HCL 5 MG PO TABS: 5.0000 mg | ORAL_TABLET | ORAL | Status: DC | PRN

## 2019-09-23 MED ORDER — OXYCODONE HCL 5 MG PO TABS
5.0000 mg | ORAL_TABLET | ORAL | Status: DC | PRN
  Administered 2019-09-23: 10 mg via ORAL
  Filled 2019-09-23: qty 2

## 2019-09-23 MED FILL — Thrombin For Soln 5000 Unit: CUTANEOUS | Qty: 5000 | Status: AC

## 2019-09-23 MED FILL — Gelatin Absorbable MT Powder: OROMUCOSAL | Qty: 1 | Status: AC

## 2019-09-23 NOTE — Evaluation (Signed)
Occupational Therapy Evaluation Patient Details Name: Aaron Schmidt MRN: [id number] DOB: 05-02-1933 Today's Date: 09/23/2019    History of Present Illness Pt is an 85 yo male s/p L1-2, L2-3 ALIF with fixation and fusion. PMHx: HTN, CAD, MI, OA.   Clinical Impression   Pt PTA: Pt living with spouse and reports independence with AD and reacher. Pt currently able to abide by back precautions with limited verbal cueing. Pt education on LB ADL with AE provided. Pt using reacher for LB dressing and donning brace with minA overall. Pt has reacher at home and his sock aid has "broken" so pt plans to find a new one. Pt reports that spouse does not provide physical assist for pt. Pt reports being agreeable to SNF and "it is up to my wife." Pain is a significant limiting factor as well as generalized weakness. Pt minguardA for mobility in room with RW and transfers. Back handout provided and reviewed ADL in detail. Pt educated on: clothing between brace, never sleep in brace, set an alarm at night for medication, avoid sitting for long periods of time, correct bed positioning for sleeping, correct sequence for bed mobility, avoiding lifting more than 5 pounds and never wash directly over incision. All education is complete and patient indicates understanding. Pt would benefit from continued OT skilled services for LB dressing and to address [facility] to see if there is a change since sx. OT following.          Follow Up Recommendations  SNF;Supervision/Assistance - 24 hour(if pt/family does not agree, HHOT recommended)    Equipment Recommendations  3 in 1 bedside commode    Recommendations for Other Services       Precautions / Restrictions Precautions Precautions: Back Precaution Booklet Issued: Yes (comment) Precaution Comments: Pt able to state 2/3 precautions. Handout discussed thoroughly. Required Braces or Orthoses: Spinal Brace Spinal Brace: Thoracolumbosacral  orthotic Restrictions Weight Bearing Restrictions: No      Mobility Bed Mobility Overal bed mobility: Needs Assistance Bed Mobility: Rolling;Sidelying to Sit;Sit to Supine Rolling: Supervision Sidelying to sit: Supervision   Sit to supine: Supervision   General bed mobility comments: Pt using rail and requires cues for correct log rolling sequence as pt performing more of a sit to supine motion.  Transfers Overall transfer level: Needs assistance Equipment used: Rolling walker (2 wheeled) Transfers: Sit to/from Stand Sit to Stand: Min guard;From elevated surface         General transfer comment: Cues for proper hand placement    Balance Overall balance assessment: Needs assistance   Sitting balance-Leahy Scale: Fair       Standing balance-Leahy Scale: Poor Standing balance comment: stood at sink leaning on sink for stability                           ADL either performed or assessed with clinical judgement   ADL Overall ADL's : Needs assistance/impaired Eating/Feeding: Set up;Bed level   Grooming: Min guard;Standing;Cueing for safety   Upper Body Bathing: Set up;Sitting;Cueing for safety   Lower Body Bathing: Minimal assistance;Moderate assistance;Cueing for safety;With adaptive equipment;Sitting/lateral leans;Sit to/from stand   Upper Body Dressing : Minimal assistance;Cueing for sequencing;Sitting   Lower Body Dressing: Minimal assistance;Moderate assistance;With adaptive equipment;Cueing for sequencing;Sitting/lateral leans;Sit to/from stand Lower Body Dressing Details (indicate cue type and reason): Donning underwear with reacher Toilet Transfer: Min guard;RW;Ambulation;BSC;Comfort height toilet;Grab bars   Toileting- Clothing Manipulation and Hygiene: Minimal assistance;Moderate assistance;Cueing for sequencing;Sitting/lateral  lean;Sit to/from stand       Functional mobility during ADLs: Min guard;Rolling walker General ADL Comments: Pt able  to abide by back precautions with limited verbal cueing. Pt education on LB ADL with AE provided. Pt using reacher for LB dressing and donning brace with minA overall. Pt has reacher at home and his sock aid has "broken" so pt plans to find a new one. Pt reports that spouse does not provide physical assist for pt.     Vision Baseline Vision/History: No visual deficits Patient Visual Report: No change from baseline Vision Assessment?: No apparent visual deficits     Perception     Praxis      Pertinent Vitals/Pain Pain Assessment: Faces Faces Pain Scale: Hurts little more Pain Location: low back Pain Descriptors / Indicators: Discomfort Pain Intervention(s): Monitored during session;Limited activity within patient's tolerance     Hand Dominance Right   Extremity/Trunk Assessment Upper Extremity Assessment Upper Extremity Assessment: Generalized weakness;RUE deficits/detail;LUE deficits/detail RUE Deficits / Details: poor grip strength RUE Coordination: decreased fine motor LUE Deficits / Details: poor grip strength LUE Coordination: decreased fine motor   Lower Extremity Assessment Lower Extremity Assessment: Generalized weakness;LLE deficits/detail LLE Deficits / Details: spasms/tremors present   Cervical / Trunk Assessment Cervical / Trunk Assessment: Other exceptions Cervical / Trunk Exceptions: s/p lumbar sx   Communication Communication Communication: HOH   Cognition Arousal/Alertness: Awake/alert Behavior During Therapy: WFL for tasks assessed/performed Overall Cognitive Status: Within Functional Limits for tasks assessed                                     General Comments       Exercises     Shoulder Instructions      Home Living Family/patient expects to be discharged to:: Private residence Living Arrangements: Spouse/significant other Available Help at Discharge: Family;Available 24 hours/day Type of Home: House Home Access: Stairs to  enter [employer] of Steps: 3 Entrance Stairs-Rails: Left;Right Home Layout: Multi-level Alternate Level Stairs-Number of Steps: 14   Bathroom Shower/Tub: Occupational psychologist: Handicapped height     Home Equipment: Environmental consultant - 2 wheels;Cane - single point          Prior Functioning/Environment Level of Independence: Independent with assistive device(s)        Comments: pt reporting "It was getting harder to take care of myself. My wife did not assist me though."        OT Problem List: Decreased strength;Decreased activity tolerance;Impaired balance (sitting and/or standing);Decreased safety awareness;Pain      OT Treatment/Interventions: Self-care/ADL training;Therapeutic exercise    OT Goals(Current goals can be found in the care plan section) Acute Rehab OT Goals Patient Stated Goal: to be in less pain OT Goal Formulation: With patient Time For Goal Achievement: 10/07/19 Potential to Achieve Goals: Good ADL Goals Pt Will Perform Upper Body Dressing: with set-up;sitting Pt Will Perform Lower Body Dressing: with min guard assist;sit to/from stand;sitting/lateral leans;with adaptive equipment Pt Will Perform Toileting - Clothing Manipulation and hygiene: sitting/lateral leans;sit to/from stand;with supervision Pt/caregiver will Perform Home Exercise Program: Increased strength;Both right and left upper extremity  OT Frequency: Min 2X/week   Barriers to D/C:            Co-evaluation              AM-PAC OT "6 Clicks" Daily Activity     Outcome Measure Help  from another person eating meals?: None Help from another person taking care of personal grooming?: None Help from another person toileting, which includes using toliet, bedpan, or urinal?: A Little Help from another person bathing (including washing, rinsing, drying)?: A Lot Help from another person to put on and taking off regular upper body clothing?: A Little Help from another  person to put on and taking off regular lower body clothing?: A Lot 6 Click Score: 18   End of Session Equipment Utilized During Treatment: Rolling walker;Back brace Nurse Communication: Mobility status  Activity Tolerance: Patient tolerated treatment well Patient left: in bed;with call bell/phone within reach  OT Visit Diagnosis: Unsteadiness on feet (R26.81);Muscle weakness (generalized) (M62.81);Pain Pain - part of body: (back)                Time: [id number] OT Time Calculation (min): 40 min Charges:  OT General Charges $OT Visit: 1 Visit OT Evaluation $OT Eval Moderate Complexity: 1 Mod OT Treatments $Self Care/Home Management : 23-37 mins  Ebony Hail Harold Hedge) Marsa Aris OTR/L Acute Rehabilitation Services Pager: [phone] Office: [place] 09/23/2019, 9:11 AM

## 2019-09-23 NOTE — Progress Notes (Signed)
  NEUROSURGERY PROGRESS NOTE   No issues overnight.  Complains of severe pain, although not unexpected for him No new motor/sensory issues  EXAM:  BP (!) 125/55 (BP Location: Right Arm)   Pulse 82   Temp 97.7 F (36.5 C) (Oral)   Resp 18   Ht 5\' 10"  (1.778 m)   Wt 76.7 kg   SpO2 99%   BMI 24.26 kg/m   Awake, alert, oriented  Speech fluent, appropriate  CN grossly intact  MAEW, mild HF weakness from lateral approach Incision: c/d/i  PLAN Stable neurologically  Has worked with PT/OT who rec SNF. Patient agreeable  Consult CSW

## 2019-09-23 NOTE — Anesthesia Postprocedure Evaluation (Signed)
Anesthesia Post Note  Patient: Aaron Schmidt  Procedure(s) Performed: Lumbar one-two, Lumbar two-three Anterolateral lumbar interbody fusion with pedicle screw fixation and exploration of adjacent fusion (N/A Spine Lumbar) LUMBAR PERCUTANEOUS PEDICLE SCREW TWO LEVEL (N/A Spine Lumbar)     Patient location during evaluation: PACU Anesthesia Type: General Level of consciousness: awake and alert Pain management: pain level controlled Vital Signs Assessment: post-procedure vital signs reviewed and stable Respiratory status: spontaneous breathing, nonlabored ventilation, respiratory function stable and patient connected to nasal cannula oxygen Cardiovascular status: blood pressure returned to baseline and stable Postop Assessment: no apparent nausea or vomiting Anesthetic complications: no    Last Vitals:  Vitals:   09/23/19 0718 09/23/19 1155  BP: (!) 125/55 119/64  Pulse: 82 88  Resp: 18 18  Temp: 36.5 C 36.9 C  SpO2: 99% 100%    Last Pain:  Vitals:   09/23/19 1155  TempSrc: Oral  PainSc:    Pain Goal: Patients Stated Pain Goal: 3 (09/23/19 0618)  LLE Motor Response: Purposeful movement (09/23/19 1300) LLE Sensation: Numbness (09/23/19 1300) RLE Motor Response: Purposeful movement (09/23/19 1300) RLE Sensation: Numbness (09/23/19 1300)        Dillen Belmontes

## 2019-09-23 NOTE — TOC Initial Note (Signed)
Transition of Care [facility]) - Initial/Assessment Note    Patient Details  Name: Aaron Schmidt MRN: [id number] Date of Birth: 07-25-1933  Transition of [facility]) CM/SW Contact:    Benard Halsted, LCSW Phone Number: 09/23/2019, 5:24 PM  Clinical Narrative:                 CSW received consult for possible SNF placement at time of discharge. CSW spoke with patient regarding PT recommendation of SNF placement at time of discharge. Patient reported that patient's spouse is currently unable to care for patient at their home given patient's current physical needs. Patient expressed understanding of PT recommendation and is agreeable to SNF placement at time of discharge. Patient reports preference for a SNF in [facility]. CSW discussed insurance authorization process and will provide Medicare SNF ratings list. Patient expressed being hopeful for rehab and to feel better soon. Patient asked if CSW would use his short term supplemental policy for SNF. CSW explained that his Medicare would be used and if he needed further care, he could contact his short term policy holder to see what they would cover. Patient stated understanding and requested his wife be present when discussing bed offers. No further questions reported at this time. CSW to continue to follow and assist with discharge planning needs.   Expected Discharge Plan: Skilled Nursing Facility Barriers to Discharge: Continued Medical Work up   Patient Goals and CMS Choice Patient states their goals for this hospitalization and ongoing recovery are:: Rehab CMS Medicare.gov Compare Post Acute Care list provided to:: Patient Choice offered to / list presented to : Patient  Expected Discharge Plan and Services Expected Discharge Plan: [place] In-house Referral: Clinical Social Work   Post Acute Care Choice: [place] Living arrangements for the past 2 months: [place]                                       Prior Living Arrangements/Services Living arrangements for the past 2 months: Single Family Home Lives with:: Spouse Patient language and need for interpreter reviewed:: Yes Do you feel safe going back to the place where you live?: Yes      Need for Family Participation in Patient Care: No (Comment) Care giver support system in place?: Yes (comment)   Criminal Activity/Legal Involvement Pertinent to Current Situation/Hospitalization: No - Comment as needed  Activities of Daily Living      Permission Sought/Granted Permission sought to share information with : Facility Sport and exercise psychologist, Family Supports Permission granted to share information with : Yes, Verbal Permission Granted  Share Information with NAME: Regino Schultze  Permission granted to share info w AGENCY: SNFs  Permission granted to share info w Relationship: Spouse  Permission granted to share info w Contact Information: [phone]  Emotional Assessment Appearance:: Appears stated age Attitude/Demeanor/Rapport: Engaged Affect (typically observed): Accepting, Appropriate Orientation: : Oriented to Self, Oriented to Place, Oriented to  Time, Oriented to Situation Alcohol / Substance Use: Not Applicable Psych Involvement: No (comment)  Admission diagnosis:  Degenerative lumbar spinal stenosis [M48.061] Patient Active Problem List   Diagnosis Date Noted  . Degenerative lumbar spinal stenosis 09/22/2019  . Hypotension 06/23/2019  . Myocarditis ([place]) 05/19/2019  . CAD (coronary artery disease) 04/20/2019  . Obesity 04/06/2019  . Malnutrition of moderate degree 03/23/2019  . NSTEMI (non-ST elevated myocardial infarction) ([place]) 03/21/2019  . Back pain 03/21/2019  .  Anemia 09/21/2017  . Symptomatic anemia 09/20/2017  . Protein-calorie malnutrition, severe 09/11/2017  . Empyema lung ([place]) 09/09/2017  . SIRS (systemic inflammatory response syndrome) ([place]) 09/05/2017  . Elevated troponin 09/05/2017  .  Normochromic normocytic anemia 09/05/2017  . Hyponatremia 09/05/2017  . Acute kidney injury superimposed on chronic kidney disease ([place]) 09/05/2017  . Chronic pain 09/05/2017  . Pleural effusion, left 09/04/2017  . Pleural effusion on left 08/19/2017  . Sinusitis, chronic 08/19/2017  . Murmur 01/08/2017  . Skin ulcer of toe of left foot, limited to breakdown of skin ([place]) 09/04/2016  . Cervical myelopathy with cervical radiculopathy 06/24/2014  . LBBB (left bundle branch block) 06/21/2014  . Hyperlipidemia 06/21/2014  . Preoperative cardiovascular examination 06/21/2014  . OBSTRUCTIVE SLEEP APNEA 09/30/2010  . Essential hypertension 08/29/2010   PCP:  Hulan Fess, MD Pharmacy:   [facility], [place] [address] Phone: [phone] Fax: [place] [id number] - [place], [place] - [address] AT [place] [address] [place] [address] Phone: [phone] Fax: [phone]     Social Determinants of Health (SDOH) Interventions    Readmission Risk Interventions No flowsheet data found.

## 2019-09-23 NOTE — Evaluation (Signed)
Physical Therapy Evaluation Patient Details Name: Aaron Schmidt MRN: [id number] DOB: [birth year] Today's Date: 09/23/2019   History of Present Illness  Pt is an 85 yo male s/p L1-2, L2-3 ALIF with fixation and fusion. PMHx: HTN, CAD, MI, OA.  Clinical Impression  Pt admitted with above diagnosis. At the time of PT eval, pt was able to demonstrate transfers and ambulation with gross min guard assist to supervision for safety. Pt was educated on precautions, brace application/wearing schedule, appropriate activity progression, and car transfer. Pt currently with functional limitations due to the deficits listed below (see PT Problem List). Pt will benefit from skilled PT to increase their independence and safety with mobility to allow discharge to the venue listed below.      Follow Up Recommendations SNF;Supervision/Assistance - 24 hour    Equipment Recommendations  None recommended by PT    Recommendations for Other Services       Precautions / Restrictions Precautions Precautions: Back Precaution Booklet Issued: Yes (comment) Precaution Comments: Pt able to state 2/3 precautions. Handout discussed thoroughly. Required Braces or Orthoses: Spinal Brace Spinal Brace: Thoracolumbosacral orthotic Restrictions Weight Bearing Restrictions: No      Mobility  Bed Mobility Overal bed mobility: Needs Assistance Bed Mobility: Rolling;Sidelying to Sit;Sit to Sidelying Rolling: Supervision Sidelying to sit: Supervision   Sit to supine: Supervision Sit to sidelying: Min assist General bed mobility comments: HOB slightly elevated and use of rails required. Increased time but overall pt was able to transition to EOB with supervision and from EOB with assist for LE elevation.   Transfers Overall transfer level: Needs assistance Equipment used: Rolling walker (2 wheeled) Transfers: Sit to/from Stand Sit to Stand: Min guard;From elevated surface         General transfer comment: Cues  for proper hand placement. Increased time to power up to full standing position.   Ambulation/Gait Ambulation/Gait assistance: Min guard Gait Distance (Feet): 125 Feet Assistive device: Rolling walker (2 wheeled) Gait Pattern/deviations: Step-through pattern;Decreased stride length;Trunk flexed Gait velocity: Decreased Gait velocity interpretation: <1.8 ft/sec, indicate of risk for recurrent falls General Gait Details: VC's for improved posture. Generally moving slow with slight tremors/shakiness in LE's but overall steady with RW.   Stairs Stairs: Yes Stairs assistance: Min guard Stair Management: One rail Right;Sideways;Step to pattern Number of Stairs: 5 General stair comments: VC's for sequencing and general safety. Wanting to hold R railing with BUE's for support. Multimodal cues for body positioning to avoid twisting.   Wheelchair Mobility    Modified Rankin (Stroke Patients Only)       Balance Overall balance assessment: Needs assistance Sitting-balance support: Feet supported;No upper extremity supported Sitting balance-Leahy Scale: Fair       Standing balance-Leahy Scale: Poor Standing balance comment: Reliant on UE support for dynamic standing activity.                              Pertinent Vitals/Pain Pain Assessment: Faces Faces Pain Scale: Hurts little more Pain Location: low back Pain Descriptors / Indicators: Discomfort Pain Intervention(s): Limited activity within patient's tolerance;Monitored during session;Repositioned    Home Living Family/patient expects to be discharged to:: Private residence Living Arrangements: Spouse/significant other Available Help at Discharge: Family;Available 24 hours/day Type of Home: House Home Access: Stairs to enter Entrance Stairs-Rails: Chemical engineer of Steps: 3 Home Layout: Multi-level Home Equipment: Environmental consultant - 2 wheels;Cane - single point Additional Comments: per pt he was up on  walker vs cane with no assist prior to hosp    Prior Function Level of Independence: Independent with assistive device(s)         Comments: pt reporting "It was getting harder to take care of myself. My wife did not assist me though."     Hand Dominance   Dominant Hand: Right    Extremity/Trunk Assessment   Upper Extremity Assessment Upper Extremity Assessment: Defer to OT evaluation RUE Deficits / Details: poor grip strength RUE Coordination: decreased fine motor LUE Deficits / Details: poor grip strength LUE Coordination: decreased fine motor    Lower Extremity Assessment Lower Extremity Assessment: Generalized weakness LLE Deficits / Details: spasms/tremors present    Cervical / Trunk Assessment Cervical / Trunk Assessment: Other exceptions Cervical / Trunk Exceptions: s/p lumbar sx  Communication   Communication: HOH  Cognition Arousal/Alertness: Awake/alert Behavior During Therapy: WFL for tasks assessed/performed Overall Cognitive Status: Within Functional Limits for tasks assessed                                        General Comments      Exercises     Assessment/Plan    PT Assessment Patient needs continued PT services  PT Problem List Decreased strength;Decreased activity tolerance;Decreased balance;Decreased mobility;Decreased knowledge of use of DME;Decreased knowledge of precautions;Decreased safety awareness;Pain       PT Treatment Interventions DME instruction;Functional mobility training;Therapeutic activities;Therapeutic exercise;Neuromuscular re-education;Patient/family education;Stair training;Gait training    PT Goals (Current goals can be found in the Care Plan section)  Acute Rehab PT Goals Patient Stated Goal: to be in less pain PT Goal Formulation: With patient Time For Goal Achievement: 10/07/19 Potential to Achieve Goals: Good    Frequency Min 5X/week   Barriers to discharge        Co-evaluation                AM-PAC PT "6 Clicks" Mobility  Outcome Measure Help needed turning from your back to your side while in a flat bed without using bedrails?: A Little Help needed moving from lying on your back to sitting on the side of a flat bed without using bedrails?: A Little Help needed moving to and from a bed to a chair (including a wheelchair)?: A Little Help needed standing up from a chair using your arms (e.g., wheelchair or bedside chair)?: A Little Help needed to walk in hospital room?: A Little Help needed climbing 3-5 steps with a railing? : A Little 6 Click Score: 18    End of Session Equipment Utilized During Treatment: Gait belt Activity Tolerance: Patient tolerated treatment well Patient left: in bed;with call bell/phone within reach Nurse Communication: Mobility status PT Visit Diagnosis: Unsteadiness on feet (R26.81);Pain;Difficulty in walking, not elsewhere classified (R26.2) Pain - part of body: (Incision site)    Time: [id number] PT Time Calculation (min) (ACUTE ONLY): 37 min   Charges:   PT Evaluation $PT Eval Moderate Complexity: 1 Mod PT Treatments $Gait Training: 8-22 mins        Aaron Schmidt, PT, DPT Acute Rehabilitation Services Pager: [phone] Office: [phone]   Aaron Schmidt 09/23/2019, 11:01 AM

## 2019-09-23 NOTE — NC FL2 (Signed)
[place] LEVEL OF CARE SCREENING TOOL     IDENTIFICATION  Patient Name: Aaron Schmidt Birthdate: [birth year] Sex: male Admission Date (Current Location): 09/22/2019  [facility] and [place] Number:  Herbalist and Address:  The . [facility], [place] [address], [place], [place] [address]      Provider Number: [id number]  Attending Physician Name and Address:  Erline Levine, MD  Relative Name and Phone Number:  Regino Schultze, spouse, [phone]    Current Level of Care: Hospital Recommended Level of Care: [place] Prior Approval Number:    Date Approved/Denied:   PASRR Number: [id number]  Discharge Plan: SNF    Current Diagnoses: Patient Active Problem List   Diagnosis Date Noted  . Degenerative lumbar spinal stenosis 09/22/2019  . Hypotension 06/23/2019  . Myocarditis ([place]) 05/19/2019  . CAD (coronary artery disease) 04/20/2019  . Obesity 04/06/2019  . Malnutrition of moderate degree 03/23/2019  . NSTEMI (non-ST elevated myocardial infarction) ([place]) 03/21/2019  . Back pain 03/21/2019  . Anemia 09/21/2017  . Symptomatic anemia 09/20/2017  . Protein-calorie malnutrition, severe 09/11/2017  . Empyema lung ([place]) 09/09/2017  . SIRS (systemic inflammatory response syndrome) ([place]) 09/05/2017  . Elevated troponin 09/05/2017  . Normochromic normocytic anemia 09/05/2017  . Hyponatremia 09/05/2017  . Acute kidney injury superimposed on chronic kidney disease ([place]) 09/05/2017  . Chronic pain 09/05/2017  . Pleural effusion, left 09/04/2017  . Pleural effusion on left 08/19/2017  . Sinusitis, chronic 08/19/2017  . Murmur 01/08/2017  . Skin ulcer of toe of left foot, limited to breakdown of skin ([place]) 09/04/2016  . Cervical myelopathy with cervical radiculopathy 06/24/2014  . LBBB (left bundle branch block) 06/21/2014  . Hyperlipidemia 06/21/2014  . Preoperative cardiovascular examination 06/21/2014  . OBSTRUCTIVE SLEEP  APNEA 09/30/2010  . Essential hypertension 08/29/2010    Orientation RESPIRATION BLADDER Height & Weight     Self, Time, Situation, Place  Normal Continent Weight: 169 lb 1.5 oz (76.7 kg) Height:  5\' 10"  (177.8 cm)  BEHAVIORAL SYMPTOMS/MOOD NEUROLOGICAL BOWEL NUTRITION STATUS      Continent Diet(Please see DC Summary)  AMBULATORY STATUS COMMUNICATION OF NEEDS Skin   Supervision Verbally Surgical wounds(Closed incision on flank and back)                       Personal Care Assistance Level of Assistance  Bathing, Feeding, Dressing Bathing Assistance: Limited assistance Feeding assistance: Independent Dressing Assistance: Limited assistance     Functional Limitations Info  Sight, Hearing, Speech Sight Info: Adequate Hearing Info: Adequate Speech Info: Adequate    SPECIAL CARE FACTORS FREQUENCY  PT (By licensed PT), OT (By licensed OT)     PT Frequency: 5x OT Frequency: 3x            Contractures Contractures Info: Not present    Additional Factors Info  Code Status, Allergies Code Status Info: Full Allergies Info: Diazepam           Current Medications (09/23/2019):  This is the current hospital active medication list Current Facility-Administered Medications  Medication Dose Route Frequency Provider Last Rate Last Admin  . acetaminophen (TYLENOL) tablet 650 mg  650 mg Oral Q4H PRN Erline Levine, MD   650 mg at 09/23/19 [id number]   Or  . acetaminophen (TYLENOL) suppository 650 mg  650 mg Rectal Q4H PRN Erline Levine, MD      . alum & mag hydroxide-simeth (MAALOX/MYLANTA) 200-200-20 MG/5ML suspension 30 mL  30  mL Oral Q6H PRN Erline Levine, MD      . ascorbic acid (VITAMIN C) tablet 500 mg  500 mg Oral Daily Erline Levine, MD   500 mg at 09/23/19 0941  . aspirin EC tablet 81 mg  81 mg Oral Daily Erline Levine, MD   81 mg at 09/23/19 [id number]  . bisacodyl (DULCOLAX) suppository 10 mg  10 mg Rectal Daily PRN Erline Levine, MD      . cholecalciferol (VITAMIN D3)  tablet 2,000 Units  2,000 Units Oral Daily Erline Levine, MD   2,000 Units at 09/23/19 [id number]  . dextrose 5 % and 0.45 % NaCl with KCl 20 mEq/L infusion   Intravenous Continuous Erline Levine, MD 75 mL/hr at 09/22/19 1749 New Bag at 09/22/19 1749  . diclofenac (VOLTAREN) EC tablet 75 mg  75 mg Oral BID Erline Levine, MD   75 mg at 09/23/19 [id number]  . docusate sodium (COLACE) capsule 600 mg  600 mg Oral Daily Erline Levine, MD   600 mg at 09/23/19 0941  . finasteride (PROSCAR) tablet 5 mg  5 mg Oral Daily Erline Levine, MD   5 mg at 09/23/19 [id number]  . fluocinonide cream (LIDEX) [id number] % 1 application  1 application Topical Daily PRN Erline Levine, MD      . HYDROmorphone (DILAUDID) injection 1 mg  1 mg Intravenous Q2H PRN Erline Levine, MD      . HYDROmorphone (DILAUDID) tablet 4 mg  4 mg Oral Q3H PRN Erline Levine, MD   4 mg at 09/23/19 1654  . hydrOXYzine (VISTARIL) injection 50 mg  50 mg Intramuscular Q6H PRN Erline Levine, MD      . losartan (COZAAR) tablet 25 mg  25 mg Oral QHS Erline Levine, MD   25 mg at 09/22/19 2125  . menthol-cetylpyridinium (CEPACOL) lozenge 3 mg  1 lozenge Oral PRN Erline Levine, MD       Or  . phenol [facility]) mouth spray 1 spray  1 spray Mouth/Throat PRN Erline Levine, MD      . mineral oil liquid 108 mL  108 mL Oral Q1500 Erline Levine, MD      . multivitamin with minerals tablet 1 tablet  1 tablet Oral Daily Erline Levine, MD   1 tablet at 09/23/19 [phone]  . nortriptyline (PAMELOR) capsule 25 mg  25 mg Oral QHS Erline Levine, MD   25 mg at 09/22/19 2125  . ondansetron (ZOFRAN) tablet 4 mg  4 mg Oral Q6H PRN Erline Levine, MD       Or  . ondansetron [facility]) injection 4 mg  4 mg Intravenous Q6H PRN Erline Levine, MD      . oxyCODONE (Oxy IR/ROXICODONE) immediate release tablet 5 mg  5 mg Oral Q3H PRN Erline Levine, MD      . pantoprazole (PROTONIX) EC tablet 40 mg  40 mg Oral Daily Erline Levine, MD   40 mg at 09/23/19 [id number]  . polyethylene glycol (MIRALAX / GLYCOLAX)  packet 17 g  17 g Oral Daily Erline Levine, MD   17 g at 09/23/19 [phone]  . polyethylene glycol (MIRALAX / GLYCOLAX) packet 17 g  17 g Oral Daily PRN Erline Levine, MD      . pregabalin (LYRICA) capsule 150 mg  150 mg Oral Daily Erline Levine, MD   150 mg at 09/23/19 0941  . pyridOXINE (VITAMIN B-6) tablet 100 mg  100 mg Oral Daily Erline Levine, MD   100 mg at 09/23/19 [id number]  . simvastatin (ZOCOR)  tablet 40 mg  40 mg Oral QHS Erline Levine, MD      . sodium chloride flush (NS) 0.9 % injection 3 mL  3 mL Intravenous Q12H Erline Levine, MD   3 mL at 09/23/19 0945  . sodium chloride flush (NS) 0.9 % injection 3 mL  3 mL Intravenous PRN Erline Levine, MD      . sodium phosphate (FLEET) 7-19 GM/118ML enema 1 enema  1 enema Rectal Once PRN Erline Levine, MD      . tiZANidine (ZANAFLEX) tablet 4 mg  4 mg Oral TID Erline Levine, MD   4 mg at 09/23/19 1654  . vitamin B-12 (CYANOCOBALAMIN) tablet 1,000 mcg  1,000 mcg Oral Daily Erline Levine, MD   1,000 mcg at 09/23/19 [phone]  . zolpidem (AMBIEN) tablet 5 mg  5 mg Oral QHS PRN Erline Levine, MD         Discharge Medications: Please see discharge summary for a list of discharge medications.  Relevant Imaging Results:  Relevant Lab Results:   Additional Information SSN: [place] negative 12/28  Benard Halsted, [place]

## 2019-09-24 LAB — SARS CORONAVIRUS 2 (TAT 6-24 HRS): SARS Coronavirus 2: NEGATIVE

## 2019-09-24 MED ORDER — HYDROMORPHONE HCL 2 MG PO TABS: 4.0000 mg | ORAL_TABLET | Freq: Four times a day (QID) | ORAL | 0 refills | Status: DC

## 2019-09-24 NOTE — Plan of Care (Signed)
Patient alert and oriented, mae's well, voiding adequate amount of urine, swallowing without difficulty, c/o soreness and discomfort at time of discharge and medication given prior to discharged to facility.  Patient discharged to facility, taken by spouse with discharged instructions given in envelope. Script and discharged instructions given to spouse. Patient and family stated understanding of instructions given. Patient has an appointment with Dr. Vertell Limber in three weeks.

## 2019-09-24 NOTE — Discharge Instructions (Addendum)
Wound Care Leave incision open to air. You may shower. Do not scrub directly on incision.  Do not put any creams, lotions, or ointments on incision. REMOVE GLUE IN 2 WEEKS Activity Walk each and every day, increasing distance each day. No lifting greater than 5 lbs.  Avoid bending, arching, and twisting. No driving for 2 weeks; may ride as a passenger locally. If provided with back brace, wear when out of bed.  It is not necessary to wear in bed. Diet Resume your normal diet.  Return to Work Will be discussed at you follow up appointment. Call Your Doctor If Any of These Occur Redness, drainage, or swelling at the wound.  Temperature greater than 101 degrees. Severe pain not relieved by pain medication. Incision starts to come apart. Follow Up Appt Call today for appointment in 3-4 weeks [id number]) or for problems.  If you have any hardware placed in your spine, you will need an x-ray before your appointment.

## 2019-09-24 NOTE — Progress Notes (Addendum)
Subjective: Patient reports "I'm hurting in my back, my leg not much"  Objective: Vital signs in last 24 hours: Temp:  [98 F (36.7 C)-98.5 F (36.9 C)] 98.5 F (36.9 C) (12/31 0717) Pulse Rate:  [73-92] 91 (12/31 0717) Resp:  [16-20] 16 (12/31 0717) BP: (113-151)/(53-71) 151/71 (12/31 0717) SpO2:  [97 %-100 %] 98 % (12/31 0717)  Intake/Output from previous day: 12/30 0701 - 12/31 0700 In: 240 [P.O.:240] Out: -  Intake/Output this shift: No intake/output data recorded.  Alert, conversant. Reports persistent lumbar pain, relieved "some" with p.o. dilaudid and tizanidine. Reports decreased leg pain post-op, reporting only mild left hip pain. Strength is good BLE. Incisions right side and lumbar healing nicely without erythema, swelling, or drainage beneath honeycomb and dermabond. Passing gas, but no BM since Saturday. (Had apparently been taking OTC laxatives daily for narcotic induced constipation.)  Lab Results: Recent Labs    09/21/19 1025  WBC 8.9  HGB 13.5  HCT 42.5  PLT 190   BMET Recent Labs    09/21/19 1025  NA 139  K 4.2  CL 101  CO2 30  GLUCOSE 107*  BUN 22  CREATININE 0.93  CALCIUM 10.2    Studies/Results: DG Lumbar Spine 2-3 Views  Result Date: 09/22/2019 CLINICAL DATA:  Surgical posterior fusion of L1-2 and L2-3. EXAM: DG C-ARM 1-60 MIN; LUMBAR SPINE - 2-3 VIEW CONTRAST:  None. FLUOROSCOPY TIME:  Fluoroscopy Time:  3 minutes 38 seconds. Number of Acquired Spot Images: 7. COMPARISON:  August 29, 2019. FINDINGS: Seven intraoperative fluoroscopic images demonstrate the patient to be status post surgical posterior fusion of L1-2 and L2-3 with bilateral intrapedicular screw placement and interbody fusion. Good alignment of vertebral bodies is noted. IMPRESSION: Fluoroscopic guidance provided during surgical posterior fusion of L1-2 and L2-3. Electronically Signed   By: Marijo Conception M.D.   On: 09/22/2019 15:23   DG C-Arm 1-60 Min  Result Date:  09/22/2019 CLINICAL DATA:  Surgical posterior fusion of L1-2 and L2-3. EXAM: DG C-ARM 1-60 MIN; LUMBAR SPINE - 2-3 VIEW CONTRAST:  None. FLUOROSCOPY TIME:  Fluoroscopy Time:  3 minutes 38 seconds. Number of Acquired Spot Images: 7. COMPARISON:  August 29, 2019. FINDINGS: Seven intraoperative fluoroscopic images demonstrate the patient to be status post surgical posterior fusion of L1-2 and L2-3 with bilateral intrapedicular screw placement and interbody fusion. Good alignment of vertebral bodies is noted. IMPRESSION: Fluoroscopic guidance provided during surgical posterior fusion of L1-2 and L2-3. Electronically Signed   By: Marijo Conception M.D.   On: 09/22/2019 15:23    Assessment/Plan: Improving  LOS: 2 days  Awaiting SNF placement. Office f/u in 3-4 weeks.    Verdis Prime 09/24/2019, 7:31 AM  Patient is progressing.  Plan SNF for Rehab.

## 2019-09-24 NOTE — Progress Notes (Signed)
RN called report to receiving nurse at [facility] facility. Update given and vitals information updated.

## 2019-09-24 NOTE — Discharge Summary (Signed)
Physician Discharge Summary  Patient ID: Aaron Schmidt MRN: [id number] DOB/AGE: [birth year] 85 y.o.  Admit date: 09/22/2019 Discharge date: 09/24/2019  Admission Diagnoses: Lumbar foraminal stenosis, degenerative spinal stenosis, lumbago, radiculopathy L 12, L 23 levels    Discharge Diagnoses: Lumbar foraminal stenosis, degenerative spinal stenosis, lumbago, radiculopathy L 12, L 23 levels s/p Lumbar one-two, Lumbar two-three Anterolateral lumbar interbody fusion with pedicle screw fixation and exploration of adjacent fusion (N/A) LUMBAR PERCUTANEOUS PEDICLE SCREW TWO LEVEL (N/A)      Active Problems:   Degenerative lumbar spinal stenosis   Discharged Condition: good  Hospital Course: Aaron Schmidt was admitted for surgery with dx stenosis and radiculopathy. Following uncomplicated surgery (as above), he recovered well and transferred to [facility] for nursing care and therapies. He is mobilizing with assistance, limited by lumbar pain.  Consults: None  Significant Diagnostic Studies: radiology: X-Ray: intra-op  Treatments: surgery: Lumbar one-two, Lumbar two-three Anterolateral lumbar interbody fusion with pedicle screw fixation and exploration of adjacent fusion (N/A) LUMBAR PERCUTANEOUS PEDICLE SCREW TWO LEVEL (N/A)     Discharge Exam: Blood pressure (!) 151/71, pulse 91, temperature 98.5 F (36.9 C), temperature source Oral, resp. rate 16, height 5\' 10"  (1.778 m), weight 76.7 kg, SpO2 98 %. Alert, conversant. Reports persistent lumbar pain, relieved "some" with p.o. dilaudid and tizanidine. Reports decreased leg pain post-op, reporting only mild left hip pain. Strength is good BLE. Incisions right side and lumbar healing nicely without erythema, swelling, or drainage beneath honeycomb and dermabond. Passing gas, but no BM since Saturday.(Had apparently been taking OTC laxatives daily over the last month for narcotic induced constipation.)    Disposition:  Discharge to SNF. Office f/u in 3-4 weeks. Rx's for prn Dilaudid and Tizanidine will be entered.    Discharge Instructions    Diet - low sodium heart healthy   Complete by: As directed    Increase activity slowly   Complete by: As directed      Allergies as of 09/24/2019      Reactions   Diazepam Other (See Comments)   "makes goofy"      Medication List    TAKE these medications   aspirin EC 81 MG tablet Take 81 mg by mouth daily.   diclofenac 75 MG EC tablet Commonly known as: VOLTAREN Take 75 mg by mouth 2 (two) times daily.   docusate sodium 100 MG capsule Commonly known as: COLACE Take 600 mg by mouth daily.   finasteride 5 MG tablet Commonly known as: PROSCAR Take 5 mg by mouth daily.   fluocinonide cream 0.05 % Commonly known as: LIDEX Apply 1 application topically daily as needed (itching).   HYDROmorphone 2 MG tablet Commonly known as: DILAUDID Take 2 tablets (4 mg total) by mouth 4 (four) times daily.   L-Lysine 500 MG Caps Take 500 mg by mouth daily.   losartan 25 MG tablet Commonly known as: COZAAR Take 1 tablet (25 mg total) by mouth daily. What changed: when to take this   methylPREDNISolone 4 MG Tbpk tablet Commonly known as: MEDROL DOSEPAK Use as directed on the package   mineral oil liquid Take 108 mLs by mouth daily in the afternoon.   multivitamin with minerals Tabs tablet Take 1 tablet by mouth daily. Mature mens 50+   nortriptyline 25 MG capsule Commonly known as: PAMELOR Take 25 mg by mouth at bedtime.   OVER THE COUNTER MEDICATION Take 3 tablets by mouth daily. Perdiem 15 mg   polyethylene glycol 17 g packet Commonly  known as: MIRALAX / GLYCOLAX Take 17 g by mouth daily.   pregabalin 150 MG capsule Commonly known as: LYRICA Take 150 mg by mouth daily.   pyridOXINE 100 MG tablet Commonly known as: VITAMIN B-6 Take 100 mg by mouth daily.   saw palmetto 80 MG capsule Take 240 mg by mouth daily.   simvastatin 40 MG  tablet Commonly known as: ZOCOR Take 40 mg by mouth at bedtime.   tiZANidine 2 MG tablet Commonly known as: ZANAFLEX Take 4 mg by mouth 3 (three) times daily.   vitamin B-12 1000 MCG tablet Commonly known as: CYANOCOBALAMIN Take 1,000 mcg by mouth daily.   vitamin C 500 MG tablet Commonly known as: ASCORBIC ACID Take 500 mg by mouth daily.   Vitamin D 50 MCG (2000 UT) Caps Take 2,000 Units by mouth daily.        Signed: Peggyann Shoals, MD 09/24/2019, 7:51 AM

## 2019-09-24 NOTE — TOC Transition Note (Signed)
Transition of Care [facility]) - CM/SW Discharge Note   Patient Details  Name: Aaron Schmidt MRN: [id number] Date of Birth: 1933-05-24  Transition of Care [facility]) CM/SW Contact:  Benard Halsted, LCSW Phone Number: 09/24/2019, 12:18 PM   Clinical Narrative:    Patient will DC to: [facility] Anticipated DC date: 09/24/19 Family notified: Spouse, Chiropodist by: Spouse by car  Please make sure signed script for Dilaudid goes with patient.   Per MD patient ready for DC to [facility]. RN, patient, patient's family, and facility notified of DC. Discharge Summary and FL2 sent to facility. RN to call report prior to discharge [phone] Room 309).    CSW will sign off for now as social work intervention is no longer needed. Please consult [place] again if new needs arise.  Cedric Fishman, LCSW Clinical Social Worker [phone]    Final next level of care: Skilled Nursing Facility Barriers to Discharge: No Barriers Identified   Patient Goals and CMS Choice Patient states their goals for this hospitalization and ongoing recovery are:: Rehab CMS Medicare.gov Compare Post Acute Care list provided to:: Patient Choice offered to / list presented to : Patient  Discharge Placement   Existing PASRR number confirmed : 09/24/19          Patient chooses bed at: [facility] Patient to be transferred to facility by: Wife by car Name of family member notified: Spouse, Regino Schultze Patient and family notified of of transfer: 09/24/19  Discharge Plan and Services In-house Referral: Clinical Social Work   Post Acute Care Choice: [place]                               Social Determinants of Health (SDOH) Interventions     Readmission Risk Interventions No flowsheet data found.

## 2019-09-24 NOTE — TOC Progression Note (Addendum)
Transition of Care [facility]) - Progression Note    Patient Details  Name: Aaron Schmidt MRN: [id number] Date of Birth: 1933-06-22  Transition of Care [facility]) CM/SW [place], LCSW Phone Number: 09/24/2019, 11:20 AM  Clinical Narrative:    CSW discussed bed offers with patient and spouse. They have requested [place]. [facility] able to accept patient on [place] today. Patient requesting his wife transport him to [facility] by [employer].    Expected Discharge Plan: [place] Barriers to Discharge: Continued Medical Work up  Expected Discharge Plan and Services Expected Discharge Plan: [place] In-house Referral: Clinical Social Work   Post Acute Care Choice: [place] Living arrangements for the past 2 months: Single Family Home Expected Discharge Date: 09/24/19                                     Social Determinants of Health (SDOH) Interventions    Readmission Risk Interventions No flowsheet data found.

## 2019-09-24 NOTE — Progress Notes (Signed)
Physical Therapy Treatment Patient Details Name: Aaron Schmidt MRN: [id number] DOB: 08-18-1933 Today's Date: 09/24/2019    History of Present Illness Pt is an 85 yo male s/p L1-2, L2-3 ALIF with fixation and fusion. PMHx: HTN, CAD, MI, OA.    PT Comments    Pt progressing slowly with post-op mobility. He was limited by pain today but able to demonstrate transfers and minimal ambulation with gross min guard assist - once instance of min assist for bed mobility. Reinforced education on precautions, brace application/wearing schedule, appropriate activity progression, and positioning recommendations. Will continue to follow.      Follow Up Recommendations  SNF;Supervision/Assistance - 24 hour     Equipment Recommendations  None recommended by PT    Recommendations for Other Services       Precautions / Restrictions Precautions Precautions: Back Precaution Booklet Issued: Yes (comment) Precaution Comments: Pt able to state 2/3 precautions. Handout discussed thoroughly. Required Braces or Orthoses: Spinal Brace Spinal Brace: Thoracolumbosacral orthotic Restrictions Weight Bearing Restrictions: No    Mobility  Bed Mobility Overal bed mobility: Needs Assistance Bed Mobility: Rolling;Sidelying to Sit;Sit to Sidelying Rolling: Supervision       Sit to sidelying: Min assist General bed mobility comments: Assist for LE elevation back up into bed at end of session.   Transfers Overall transfer level: Needs assistance Equipment used: Rolling walker (2 wheeled) Transfers: Sit to/from Stand Sit to Stand: Min guard         General transfer comment: Cues for proper hand placement. Increased time to power up to full standing position due to reported pain.   Ambulation/Gait Ambulation/Gait assistance: Min guard Gait Distance (Feet): 15 Feet Assistive device: Rolling walker (2 wheeled) Gait Pattern/deviations: Step-through pattern;Decreased stride length;Trunk flexed Gait  velocity: Decreased Gait velocity interpretation: <1.8 ft/sec, indicate of risk for recurrent falls General Gait Details: Pt rushing to get around the bed to sit down from the recliner. Anxious to lay back down due to pain but actually appears to be steadier and with quicker gait speed compared to evaluation.    Stairs             Wheelchair Mobility    Modified Rankin (Stroke Patients Only)       Balance Overall balance assessment: Needs assistance Sitting-balance support: Feet supported;No upper extremity supported Sitting balance-Leahy Scale: Fair       Standing balance-Leahy Scale: Poor Standing balance comment: Reliant on UE support for dynamic standing activity.                             Cognition Arousal/Alertness: Awake/alert Behavior During Therapy: WFL for tasks assessed/performed Overall Cognitive Status: Within Functional Limits for tasks assessed                                        Exercises      General Comments        Pertinent Vitals/Pain Pain Assessment: Faces Faces Pain Scale: Hurts even more Pain Location: low back Pain Descriptors / Indicators: Discomfort Pain Intervention(s): Limited activity within patient's tolerance;Monitored during session;Repositioned    Home Living                      Prior Function            PT Goals (current goals can now be found  in the care plan section) Acute Rehab PT Goals Patient Stated Goal: to be in less pain PT Goal Formulation: With patient Time For Goal Achievement: 10/07/19 Potential to Achieve Goals: Good Progress towards PT goals: Progressing toward goals    Frequency    Min 5X/week      PT Plan Current plan remains appropriate    Co-evaluation              AM-PAC PT "6 Clicks" Mobility   Outcome Measure  Help needed turning from your back to your side while in a flat bed without using bedrails?: A Little Help needed moving from  lying on your back to sitting on the side of a flat bed without using bedrails?: A Little Help needed moving to and from a bed to a chair (including a wheelchair)?: A Little Help needed standing up from a chair using your arms (e.g., wheelchair or bedside chair)?: A Little Help needed to walk in hospital room?: A Little Help needed climbing 3-5 steps with a railing? : A Little 6 Click Score: 18    End of Session Equipment Utilized During Treatment: Gait belt Activity Tolerance: Patient tolerated treatment well Patient left: in bed;with call bell/phone within reach Nurse Communication: Mobility status PT Visit Diagnosis: Unsteadiness on feet (R26.81);Pain;Difficulty in walking, not elsewhere classified (R26.2) Pain - part of body: (Incision site)     Time: [id number] PT Time Calculation (min) (ACUTE ONLY): 10 min  Charges:  $Gait Training: 8-22 mins                     Rolinda Roan, PT, DPT Acute Rehabilitation Services Pager: [phone] Office: [phone]    Thelma Comp 09/24/2019, 12:41 PM

## 2019-10-02 MED FILL — Heparin Sodium (Porcine) Inj 1000 Unit/ML: INTRAMUSCULAR | Qty: 30 | Status: AC

## 2019-10-02 MED FILL — Sodium Chloride IV Soln 0.9%: INTRAVENOUS | Qty: 1000 | Status: AC

## 2020-03-01 DIAGNOSIS — M791 Myalgia, unspecified site: Secondary | ICD-10-CM | POA: Insufficient documentation

## 2020-03-01 HISTORY — DX: Myalgia, unspecified site: M79.10

## 2020-06-05 ENCOUNTER — Other Ambulatory Visit: Payer: Self-pay | Admitting: Physician Assistant

## 2020-06-15 DIAGNOSIS — H903 Sensorineural hearing loss, bilateral: Secondary | ICD-10-CM

## 2020-06-15 HISTORY — DX: Sensorineural hearing loss, bilateral: H90.3

## 2020-07-21 ENCOUNTER — Emergency Department (HOSPITAL_BASED_OUTPATIENT_CLINIC_OR_DEPARTMENT_OTHER): Payer: Medicare Other

## 2020-07-21 ENCOUNTER — Encounter (HOSPITAL_BASED_OUTPATIENT_CLINIC_OR_DEPARTMENT_OTHER): Payer: Self-pay | Admitting: *Deleted

## 2020-07-21 ENCOUNTER — Emergency Department (HOSPITAL_BASED_OUTPATIENT_CLINIC_OR_DEPARTMENT_OTHER)
Admission: EM | Admit: 2020-07-21 | Discharge: 2020-07-22 | Disposition: A | Discharge status: Home / Self Care | Payer: Medicare Other | Attending: Emergency Medicine | Admitting: Emergency Medicine

## 2020-07-21 ENCOUNTER — Other Ambulatory Visit: Payer: Self-pay

## 2020-07-21 DIAGNOSIS — N189 Chronic kidney disease, unspecified: Secondary | ICD-10-CM | POA: Diagnosis not present

## 2020-07-21 DIAGNOSIS — Z7982 Long term (current) use of aspirin: Secondary | ICD-10-CM | POA: Diagnosis not present

## 2020-07-21 DIAGNOSIS — I2510 Atherosclerotic heart disease of native coronary artery without angina pectoris: Secondary | ICD-10-CM | POA: Insufficient documentation

## 2020-07-21 DIAGNOSIS — Z87891 Personal history of nicotine dependence: Secondary | ICD-10-CM | POA: Insufficient documentation

## 2020-07-21 DIAGNOSIS — Z20822 Contact with and (suspected) exposure to covid-19: Secondary | ICD-10-CM | POA: Diagnosis not present

## 2020-07-21 DIAGNOSIS — Z79899 Other long term (current) drug therapy: Secondary | ICD-10-CM | POA: Insufficient documentation

## 2020-07-21 DIAGNOSIS — I129 Hypertensive chronic kidney disease with stage 1 through stage 4 chronic kidney disease, or unspecified chronic kidney disease: Secondary | ICD-10-CM | POA: Insufficient documentation

## 2020-07-21 DIAGNOSIS — R4182 Altered mental status, unspecified: Secondary | ICD-10-CM | POA: Diagnosis present

## 2020-07-21 LAB — COMPREHENSIVE METABOLIC PANEL
ALT: 20 U/L (ref 0–44)
AST: 56 U/L — ABNORMAL HIGH (ref 15–41)
Albumin: 4.8 g/dL (ref 3.5–5.0)
Alkaline Phosphatase: 69 U/L (ref 38–126)
Anion gap: 10 (ref 5–15)
BUN: 20 mg/dL (ref 8–23)
CO2: 28 mmol/L (ref 22–32)
Calcium: 9.8 mg/dL (ref 8.9–10.3)
Chloride: 102 mmol/L (ref 98–111)
Creatinine, Ser: 0.9 mg/dL (ref 0.61–1.24)
GFR, Estimated: >60 mL/min (ref >60)
Glucose, Bld: 155 mg/dL — ABNORMAL HIGH (ref 70–99)
Potassium: 4.4 mmol/L (ref 3.5–5.1)
Sodium: 140 mmol/L (ref 135–145)
Total Bilirubin: 1.1 mg/dL (ref 0.3–1.2)
Total Protein: 6.8 g/dL (ref 6.5–8.1)

## 2020-07-21 LAB — URINALYSIS, ROUTINE W REFLEX MICROSCOPIC
Bilirubin Urine: NEGATIVE
Glucose, UA: NEGATIVE mg/dL
Hgb urine dipstick: NEGATIVE
Ketones, ur: NEGATIVE mg/dL
Leukocytes,Ua: NEGATIVE
Nitrite: NEGATIVE
Protein, ur: NEGATIVE mg/dL
Specific Gravity, Urine: 1.015 (ref 1.005–1.030)
pH: 8 (ref 5.0–8.0)

## 2020-07-21 LAB — CBC WITH DIFFERENTIAL/PLATELET
Abs Immature Granulocytes: 0.01 10*3/uL (ref 0.00–0.07)
Basophils Absolute: 0.1 10*3/uL (ref 0.0–0.1)
Basophils Relative: 1 %
Eosinophils Absolute: 0.1 10*3/uL (ref 0.0–0.5)
Eosinophils Relative: 1 %
HCT: 43.6 % (ref 39.0–52.0)
Hemoglobin: 14.4 g/dL (ref 13.0–17.0)
Immature Granulocytes: 0 %
Lymphocytes Relative: 13 %
Lymphs Abs: 0.9 10*3/uL (ref 0.7–4.0)
MCH: 31.2 pg (ref 26.0–34.0)
MCHC: 33 g/dL (ref 30.0–36.0)
MCV: 94.6 fL (ref 80.0–100.0)
Monocytes Absolute: 0.5 10*3/uL (ref 0.1–1.0)
Monocytes Relative: 7 %
Neutro Abs: 5.6 10*3/uL (ref 1.7–7.7)
Neutrophils Relative %: 78 %
Platelets: 172 10*3/uL (ref 150–400)
RBC: 4.61 MIL/uL (ref 4.22–5.81)
RDW: 14.5 % (ref 11.5–15.5)
WBC: 7.1 10*3/uL (ref 4.0–10.5)
nRBC: 0 % (ref 0.0–0.2)

## 2020-07-21 LAB — CBG MONITORING, ED: Glucose-Capillary: 113 mg/dL — ABNORMAL HIGH (ref 70–99)

## 2020-07-21 NOTE — ED Triage Notes (Addendum)
Confusion x 2 hours. His leg is shaking. He is alert on arrival to triage. Follows command. He started a new medication for back spasms 2 days ago.

## 2020-07-21 NOTE — ED Notes (Signed)
Patient transported to CT 

## 2020-07-21 NOTE — Discharge Instructions (Addendum)
You came in for mental status change.  It potentially could be due to the baclofen but also with the tremoring seizure was a possibility.  You have chosen not to be admitted to the hospital.  Follow-up with your doctors and stop the baclofen for now.

## 2020-07-21 NOTE — ED Notes (Signed)
Set of blood cultures and blue, gold, green, light green, lavender, and gray top on ice drawn and held in lab.

## 2020-07-21 NOTE — ED Provider Notes (Addendum)
[facility] Provider Note   CSN: [id number] Arrival date & time: 07/21/20  2044     History Chief Complaint  Patient presents with  . Altered Mental Status    Aaron Schmidt is a 86 y.o. male.  HPI Patient presents with altered mental status.  Reportedly began at around 4:00 today.  Reportedly confused difficulty eating.  Does know his wife.  Able to get up but is been more unsteady.  No chest pain.  No headache.  Did have steroid injection today at pain medicine and states that he was doing fine then.  Also has had 2 days of a new muscle relaxer that sounds like it is likely baclofen.  States he has had this in the past the different time without any issues.  No headache.  No dysuria.  No cough.  Does have a tremor in the left foot now but reportedly had been shaking all over while he was awake.  Reportedly took a baclofen at around 11 or 12 today also.    Past Medical History:  Diagnosis Date  . Arthritis    Degeneration spine & stenosis  . History of kidney stones   . Hypertension    has a histroy of  . Hypotension   . Myocardial infarction ([place]) 03/21/2019  . Sleep apnea 2012   used CPAP 2 yrs. ago, feels he sleeps better w/o, no longer using     Patient Active Problem List   Diagnosis Date Noted  . Degenerative lumbar spinal stenosis 09/22/2019  . Hypotension 06/23/2019  . Myocarditis ([place]) 05/19/2019  . CAD (coronary artery disease) 04/20/2019  . Obesity 04/06/2019  . Malnutrition of moderate degree 03/23/2019  . NSTEMI (non-ST elevated myocardial infarction) ([place]) 03/21/2019  . Back pain 03/21/2019  . Anemia 09/21/2017  . Symptomatic anemia 09/20/2017  . Protein-calorie malnutrition, severe 09/11/2017  . Empyema lung ([place]) 09/09/2017  . SIRS (systemic inflammatory response syndrome) ([place]) 09/05/2017  . Elevated troponin 09/05/2017  . Normochromic normocytic anemia 09/05/2017  . Hyponatremia 09/05/2017  . Acute kidney injury  superimposed on chronic kidney disease ([place]) 09/05/2017  . Chronic pain 09/05/2017  . Pleural effusion, left 09/04/2017  . Pleural effusion on left 08/19/2017  . Sinusitis, chronic 08/19/2017  . Murmur 01/08/2017  . Skin ulcer of toe of left foot, limited to breakdown of skin ([place]) 09/04/2016  . Cervical myelopathy with cervical radiculopathy ([place]) 06/24/2014  . LBBB (left bundle branch block) 06/21/2014  . Hyperlipidemia 06/21/2014  . Preoperative cardiovascular examination 06/21/2014  . OBSTRUCTIVE SLEEP APNEA 09/30/2010  . Essential hypertension 08/29/2010    Past Surgical History:  Procedure Laterality Date  . ANTERIOR LAT LUMBAR FUSION N/A 09/22/2019   Procedure: Lumbar one-two, Lumbar two-three Anterolateral lumbar interbody fusion with pedicle screw fixation and exploration of adjacent fusion;  Surgeon: Erline Levine, MD;  Location: [facility];  Service: Neurosurgery;  Laterality: N/A;  . ARM NEUROPLASTY     at 14 yrs. of age- fell off tractor- had fracture & repair *& later- 48's had  transplantation of a nerve at the elbow  . BACK SURGERY     x4 back surgery x2 fusion -  . CARDIAC CATHETERIZATION  03/23/2019  . CARPAL TUNNEL RELEASE Right   . COLONOSCOPY    . ESOPHAGOGASTRODUODENOSCOPY (EGD) WITH PROPOFOL N/A 09/23/2017   Procedure: ESOPHAGOGASTRODUODENOSCOPY (EGD) WITH PROPOFOL;  Surgeon: Laurence Spates, MD;  Location: [facility];  Service: Endoscopy;  Laterality: N/A;  . LEFT HEART CATH AND CORONARY ANGIOGRAPHY  N/A 03/23/2019   Procedure: LEFT HEART CATH AND CORONARY ANGIOGRAPHY;  Surgeon: Burnell Blanks, MD;  Location: [facility];  Service: Cardiovascular;  Laterality: N/A;  . LUMBAR PERCUTANEOUS PEDICLE SCREW 2 LEVEL N/A 09/22/2019   Procedure: LUMBAR PERCUTANEOUS PEDICLE SCREW TWO LEVEL;  Surgeon: Erline Levine, MD;  Location: [facility];  Service: Neurosurgery;  Laterality: N/A;  . PLEURAL EFFUSION DRAINAGE Left 09/09/2017   Procedure: DRAINAGE OF PLEURAL  EFFUSION;  Surgeon: [name], MD;  Location: [facility];  Service: Thoracic;  Laterality: Left;  . POSTERIOR CERVICAL FUSION/FORAMINOTOMY N/A 06/24/2014   Procedure: Posterior Cervical Three-Seven Fusion with Lateral Mass Fixation;  Surgeon: Erline Levine, MD;  Location: [facility];  Service: Neurosurgery;  Laterality: N/A;  C3-C7 posterior cervical fusion with lateral mass fixation  . SHOULDER SURGERY Bilateral   . VIDEO ASSISTED THORACOSCOPY (VATS)/DECORTICATION Left 09/09/2017   Procedure: VIDEO ASSISTED THORACOSCOPY (VATS)/DECORTICATION;  Surgeon: [name], MD;  Location: [facility];  Service: Thoracic;  Laterality: Left;  Marland Kitchen VIDEO BRONCHOSCOPY  09/09/2017   Procedure: VIDEO BRONCHOSCOPY;  Surgeon: [name], MD;  Location: [facility];  Service: Thoracic;;       Family History  Problem Relation Age of Onset  . Pancreatic cancer Mother     Social History   Tobacco Use  . Smoking status: Former Smoker    Years: 24.00    Types: Pipe    Quit date: 09/24/1978    Years since quitting: 41.8  . Smokeless tobacco: Never Used  Vaping Use  . Vaping Use: Never used  Substance Use Topics  . Alcohol use: Yes    Comment: 3 drinks/day - gin or vodka   . Drug use: No    Home Medications Prior to Admission medications   Medication Sig Start Date End Date Taking? Authorizing Provider  aspirin EC 81 MG tablet Take 81 mg by mouth daily.    [provider]  Cholecalciferol (VITAMIN D) 2000 UNITS CAPS Take 2,000 Units by mouth daily.     [provider]  diclofenac (VOLTAREN) 75 MG EC tablet Take 75 mg by mouth 2 (two) times daily. 08/19/19   [provider]  docusate sodium (COLACE) 100 MG capsule Take 600 mg by mouth daily.    [provider]  finasteride (PROSCAR) 5 MG tablet Take 5 mg by mouth daily.    [provider]  fluocinonide cream (LIDEX) 1.75 % Apply 1 application topically daily as needed (itching).    [provider]  HYDROmorphone (DILAUDID) 2 MG tablet Take 2 tablets (4 mg total) by mouth 4 (four) times daily. 09/24/19   Erline Levine, MD  L-Lysine 500 MG CAPS Take 500 mg by mouth daily.     [provider]  losartan (COZAAR) 25 MG tablet Take 1 tablet (25 mg total) by mouth daily. Please make yearly appt with Dr. [name] for November for future refills. 1st attempt 06/06/20   Dorothy Spark, MD  methylPREDNISolone (MEDROL DOSEPAK) 4 MG TBPK tablet Use as directed on the package Patient not taking: Reported on 09/08/2019 08/02/19   Fatima Blank, MD  mineral oil liquid Take 108 mLs by mouth daily in the afternoon.     [provider]  Multiple Vitamin (MULTIVITAMIN WITH MINERALS) TABS tablet Take 1 tablet by mouth daily. Mature mens 50+    [provider]  nortriptyline (PAMELOR) 25 MG capsule Take 25 mg by mouth at bedtime.     [provider]  OVER THE COUNTER MEDICATION Take 3 tablets by mouth daily. Perdiem 15 mg     [provider]  polyethylene glycol (MIRALAX / GLYCOLAX) packet Take 17 g by mouth daily. 10/13/17   Sherwood Gambler, MD  pregabalin (LYRICA) 150 MG capsule Take 150 mg by mouth daily.     [provider]  pyridOXINE (VITAMIN B-6) 100 MG tablet Take 100 mg by mouth daily.    [provider]  saw palmetto 80 MG capsule Take 240 mg by mouth daily.     [provider]  simvastatin (ZOCOR) 40 MG tablet Take 40 mg by mouth at bedtime.     [provider]  tiZANidine (ZANAFLEX) 2 MG tablet Take 4 mg by mouth 3 (three) times daily.     [provider]  vitamin B-12 (CYANOCOBALAMIN) 1000 MCG tablet Take 1,000 mcg by mouth daily.     [provider]  vitamin C (ASCORBIC ACID) 500 MG tablet Take 500 mg by mouth daily.    [provider]    Allergies    Diazepam  Review of Systems   Review of Systems  Constitutional: Negative for appetite change.  HENT: Negative  for congestion.   Respiratory: Negative for shortness of breath.   Cardiovascular: Negative for chest pain.  Gastrointestinal: Negative for abdominal pain.  Genitourinary: Negative for flank pain.  Musculoskeletal: Negative for back pain.  Skin: Negative for rash.  Neurological: Positive for tremors.  Psychiatric/Behavioral: Positive for confusion.    Physical Exam Updated Vital Signs BP (!) 162/78   Pulse 89   Temp 99 F (37.2 C) (Oral)   Resp 20   Ht 5\' 10"  (1.778 m)   Wt 76.8 kg   SpO2 100%   BMI 24.29 kg/m   Physical Exam Vitals and nursing note reviewed.  HENT:     Head: Normocephalic and atraumatic.     Mouth/Throat:     Mouth: Mucous membranes are moist.  Eyes:     Extraocular Movements: Extraocular movements intact.     Pupils: Pupils are equal, round, and reactive to light.  Cardiovascular:     Rate and Rhythm: Regular rhythm.  Pulmonary:     Breath sounds: No wheezing or rhonchi.  Abdominal:     Tenderness: There is no abdominal tenderness.  Musculoskeletal:        General: No tenderness.  Skin:    General: Skin is warm.     Capillary Refill: Capillary refill takes less than 2 seconds.  Neurological:     Mental Status: He is alert.     Comments: Awake and pleasant.  Can answer questions and move all extremities.  Does have a little bit of a tremor in her left foot but does have normal controlled movement of that foot also.     ED Results / Procedures / Treatments   Labs (all labs ordered are listed, but only abnormal results are displayed) Labs Reviewed  URINALYSIS, ROUTINE W REFLEX MICROSCOPIC - Abnormal; Notable for the following components:      Result Value   APPearance CLOUDY (*)    All other components within normal limits  COMPREHENSIVE METABOLIC PANEL - Abnormal; Notable for the following components:   Glucose, Bld 155 (*)    AST 56 (*)    All other components within normal limits  CBG MONITORING, ED - Abnormal; Notable for the following  components:   Glucose-Capillary 113 (*)    All other components within normal limits  RESP PANEL BY RT PCR (RSV, FLU A&B, COVID)  CBC WITH DIFFERENTIAL/PLATELET    EKG EKG Interpretation  Date/Time:  Thursday July 21 2020 21:04:09 EDT Ventricular Rate:  89 PR Interval:    QRS Duration: 170 QT Interval:  425 QTC Calculation: 518 R Axis:   -39 Text Interpretation: Sinus rhythm Left bundle branch block No significant change since last tracing Confirmed by Davonna Belling [phone]) on 07/21/2020 9:18:48 PM   Radiology CT Head Wo Contrast  Result Date: 07/21/2020 CLINICAL DATA:  Delirium and confusion EXAM: CT HEAD WITHOUT CONTRAST TECHNIQUE: Contiguous axial images were obtained from the base of the skull through the vertex without intravenous contrast. COMPARISON:  March 21, 2019 FINDINGS: Brain: No evidence of acute territorial infarction, hemorrhage, hydrocephalus,extra-axial collection or mass lesion/mass effect. There is dilatation the ventricles and sulci consistent with age-related atrophy. Low-attenuation changes in the deep white matter consistent with small vessel ischemia. Vascular: No hyperdense vessel or unexpected calcification. Skull: The skull is intact. No fracture or focal lesion identified. Sinuses/Orbits: The visualized paranasal sinuses and mastoid air cells are clear. The orbits and globes intact. Other: None IMPRESSION: No acute intracranial abnormality. Findings consistent with age related atrophy and chronic small vessel ischemia Electronically Signed   By: Prudencio Pair M.D.   On: 07/21/2020 23:49   DG Chest Portable 1 View  Result Date: 07/21/2020 CLINICAL DATA:  Confusion. EXAM: PORTABLE CHEST 1 VIEW COMPARISON:  March 21, 2019 FINDINGS: The heart size is mildly enlarged but stable. Aortic calcifications are noted. There is no pneumothorax. No large pleural effusion. There are linear airspace opacities in the left mid lung zone and left lower lobe favored to  represent areas of atelectasis or scarring is seen on the patient's prior CT. There is no definite acute osseous abnormality. The pulmonary vasculature appears prominent and is likely dilated. IMPRESSION: 1. No acute cardiopulmonary process. 2. Stable cardiac enlargement. 3. Scarring or atelectasis in the left mid lung zone and left lower lobe. Electronically Signed   By: Constance Holster M.D.   On: 07/21/2020 21:45    Procedures Procedures (including critical care time)  Medications Ordered in ED Medications - No data to display  ED Course  I have reviewed the triage vital signs and the nursing notes.  Pertinent labs & imaging results that were available during my care of the patient were reviewed by me and considered in my medical decision making (see chart for details).    MDM Rules/Calculators/A&P                          Patient with mental status change.  Worse starting around 4:00.  Awake and pleasant but may have some mild confusion now.  Recently did start baclofen.  Chest x-ray reassuring.  Blood work reassuring but urinalysis still pending.  Head CT also pending.  There is a broad differential for what could be causing mental status changes.  Infection possible, medication reaction to the new muscle relaxer definitely a possibility.  Will see results of urine head CT and reevaluate patient.  If continued mental status change potential admission to the hospital.  If still continues to improve somewhat may discharge home with adjustment of the medication.    Discussed with patient.  Did have tremors in the foot for the last months states since it has been since previous back surgery.  Did have tremors diffusely but I think less likely seizure.  However with the possibility of  seizure we discussed admission to the hospital and patient refused.  States he cannot handle the transfer.  We think most likely related to the baclofen.  We'll stop that.  Follow-up as an outpatient.  Discharge  home. Final Clinical Impression(s) / ED Diagnoses Final diagnoses:  Altered mental status, unspecified altered mental status type    Rx / DC Orders ED Discharge Orders    None       Davonna Belling, MD 07/21/20 2308    Davonna Belling, MD 07/21/20 2355

## 2020-07-22 LAB — RESP PANEL BY RT PCR (RSV, FLU A&B, COVID)
Influenza A by PCR: NEGATIVE
Influenza B by PCR: NEGATIVE
Respiratory Syncytial Virus by PCR: NEGATIVE
SARS Coronavirus 2 by RT PCR: NEGATIVE

## 2020-07-26 DIAGNOSIS — G8929 Other chronic pain: Secondary | ICD-10-CM

## 2020-07-26 DIAGNOSIS — M545 Low back pain, unspecified: Secondary | ICD-10-CM

## 2020-07-26 DIAGNOSIS — R251 Tremor, unspecified: Secondary | ICD-10-CM

## 2020-07-26 HISTORY — DX: Low back pain, unspecified: M54.50

## 2020-07-26 HISTORY — DX: Tremor, unspecified: R25.1

## 2020-07-26 HISTORY — DX: Other chronic pain: G89.29

## 2020-07-27 ENCOUNTER — Encounter: Payer: Self-pay | Admitting: Neurology

## 2020-07-27 NOTE — Progress Notes (Signed)
Cardiology Office Note    Date:  08/03/2020   ID:  Aaron Schmidt, DOB 02/02/1933, MRN [id number]  PCP:  Hulan Fess, MD  Cardiologist: Ena Dawley, MD EPS: None  Chief Complaint  Patient presents with  . Follow-up    History of Present Illness:  Aaron Schmidt is a 86 y.o. male with history of hypertension, HLD, chronic LBBB, OSA on CPAP.  Patient was admitted with chest pain and confusion.  Troponin elevated to 2263.  EKG with chronic left bundle branch block.  Cardiac catheterization 03/23/2019 no evidence of CAD.  2D echo LVEF 45 to 50% with moderate concentric LVH and impaired relaxation.  When compared to echo in 2018 decrease in LV function with hypokinesis of the basal and mid inferior septal and inferior walls.  Was felt he could potentially  have had a mild myocarditis with new LV dysfunction.  Losartan 25 mg was added.  Encephalopathy resolved.    I restarted Coreg 3.125 mg twice daily and losartan 12.5 mg daily 04/07/2019.  I saw him again 04/21/2019 at which time I told him to increase his carvedilol to 6.25 mg twice daily.  F/U limited echo 05/28/19 LVEF normalized 60-65% Moderate MR   LOV 06/23/19 patient's BP running higher and having a lot of back pain. He had restarted coreg but BP would drop after taking it.I stopped in and increased losartan 25 mg once daily.   Office visit 07/2019 patient was still hypotensive and Dr. [name] asked him to cut his losartan in half but he thought the pill was too small to break  Patient went to the ER 07/21/2020 with mental status changes and confusion after starting baclofen.  Patient was also having a tremor in his foot since recent back surgery and they advised admission for possible seizure but the patient declined.  Head CT showed no acute abnormality age-related atrophy and chronic small vessel ischemia.  Patient comes in accompanied by his wife. Denies chest pain, palpitations, dyspnea, dizziness or presyncope. Has lost 14 lbs  in the past year. Not eating as much but has a protein drink at lunch. Limited with chronic back problems. Sees neurologist soon.  Past Medical History:  Diagnosis Date  . Arthritis    Degeneration spine & stenosis  . History of kidney stones   . Hypertension    has a histroy of  . Hypotension   . Myocardial infarction ([place]) 03/21/2019  . Sleep apnea 2012   used CPAP 2 yrs. ago, feels he sleeps better w/o, no longer using     Past Surgical History:  Procedure Laterality Date  . ANTERIOR LAT LUMBAR FUSION N/A 09/22/2019   Procedure: Lumbar one-two, Lumbar two-three Anterolateral lumbar interbody fusion with pedicle screw fixation and exploration of adjacent fusion;  Surgeon: Erline Levine, MD;  Location: [facility];  Service: Neurosurgery;  Laterality: N/A;  . ARM NEUROPLASTY     at 14 yrs. of age- fell off tractor- had fracture & repair *& later- 1's had  transplantation of a nerve at the elbow  . BACK SURGERY     x4 back surgery x2 fusion -  . CARDIAC CATHETERIZATION  03/23/2019  . CARPAL TUNNEL RELEASE Right   . COLONOSCOPY    . ESOPHAGOGASTRODUODENOSCOPY (EGD) WITH PROPOFOL N/A 09/23/2017   Procedure: ESOPHAGOGASTRODUODENOSCOPY (EGD) WITH PROPOFOL;  Surgeon: Laurence Spates, MD;  Location: [facility];  Service: Endoscopy;  Laterality: N/A;  . LEFT HEART CATH AND CORONARY ANGIOGRAPHY N/A 03/23/2019   Procedure: LEFT HEART  CATH AND CORONARY ANGIOGRAPHY;  Surgeon: Burnell Blanks, MD;  Location: [facility];  Service: Cardiovascular;  Laterality: N/A;  . LUMBAR PERCUTANEOUS PEDICLE SCREW 2 LEVEL N/A 09/22/2019   Procedure: LUMBAR PERCUTANEOUS PEDICLE SCREW TWO LEVEL;  Surgeon: Erline Levine, MD;  Location: [facility];  Service: Neurosurgery;  Laterality: N/A;  . PLEURAL EFFUSION DRAINAGE Left 09/09/2017   Procedure: DRAINAGE OF PLEURAL EFFUSION;  Surgeon: [name], MD;  Location: [facility];  Service: Thoracic;  Laterality: Left;  . POSTERIOR CERVICAL  FUSION/FORAMINOTOMY N/A 06/24/2014   Procedure: Posterior Cervical Three-Seven Fusion with Lateral Mass Fixation;  Surgeon: Erline Levine, MD;  Location: [facility];  Service: Neurosurgery;  Laterality: N/A;  C3-C7 posterior cervical fusion with lateral mass fixation  . SHOULDER SURGERY Bilateral   . VIDEO ASSISTED THORACOSCOPY (VATS)/DECORTICATION Left 09/09/2017   Procedure: VIDEO ASSISTED THORACOSCOPY (VATS)/DECORTICATION;  Surgeon: [name], MD;  Location: [facility];  Service: Thoracic;  Laterality: Left;  Marland Kitchen VIDEO BRONCHOSCOPY  09/09/2017   Procedure: VIDEO BRONCHOSCOPY;  Surgeon: [name], MD;  Location: [facility];  Service: Thoracic;;    Current Medications: Current Meds  Medication Sig  . aspirin EC 81 MG tablet Take 81 mg by mouth daily.  . Cholecalciferol (VITAMIN D) 2000 UNITS CAPS Take 2,000 Units by mouth daily.   . diclofenac (VOLTAREN) 75 MG EC tablet Take 75 mg by mouth 2 (two) times daily.  Marland Kitchen docusate sodium (COLACE) 100 MG capsule Take 600 mg by mouth daily.  . finasteride (PROSCAR) 5 MG tablet Take 5 mg by mouth daily.  . fluocinonide cream (LIDEX) 0.08 % Apply 1 application topically daily as needed (itching).  Marland Kitchen HYDROmorphone (DILAUDID) 2 MG tablet Take 2 tablets (4 mg total) by mouth 4 (four) times daily.  Marland Kitchen L-Lysine 500 MG CAPS Take 500 mg by mouth daily.   Marland Kitchen losartan (COZAAR) 25 MG tablet Take 1 tablet (25 mg total) by mouth daily. Please make yearly appt with Dr. [name] for November for future refills. 1st attempt  . methylPREDNISolone (MEDROL DOSEPAK) 4 MG TBPK tablet Use as directed on the package  . mineral oil liquid Take 108 mLs by mouth daily in the afternoon.   . Multiple Vitamin (MULTIVITAMIN WITH MINERALS) TABS tablet Take 1 tablet by mouth daily. Mature mens 4+  . nortriptyline (PAMELOR) 25 MG capsule Take 25 mg by mouth at bedtime.   Marland Kitchen OVER THE COUNTER MEDICATION Take 3 tablets by mouth daily. Perdiem 15 mg   . polyethylene glycol  (MIRALAX / GLYCOLAX) packet Take 17 g by mouth daily.  . pregabalin (LYRICA) 150 MG capsule Take 150 mg by mouth daily.   Marland Kitchen pyridOXINE (VITAMIN B-6) 100 MG tablet Take 100 mg by mouth daily.  . saw palmetto 80 MG capsule Take 240 mg by mouth daily.   . simvastatin (ZOCOR) 40 MG tablet Take 40 mg by mouth at bedtime.   Marland Kitchen tiZANidine (ZANAFLEX) 2 MG tablet Take 4 mg by mouth 3 (three) times daily.   . vitamin B-12 (CYANOCOBALAMIN) 1000 MCG tablet Take 1,000 mcg by mouth daily.   . vitamin C (ASCORBIC ACID) 500 MG tablet Take 500 mg by mouth daily.     Allergies:   Baclofen and Diazepam   Social History   Socioeconomic History  . Marital status: Married    Spouse name: Not on file  . Number of children: Not on file  . Years of education: Not on file  . Highest education level:  Not on file  Occupational History  . Occupation: Retired    Comment: Worked for [employer] and retired in [place] Use  . Smoking status: Former Smoker    Years: 24.00    Types: Pipe    Quit date: 09/24/1978    Years since quitting: 41.8  . Smokeless tobacco: Never Used  Vaping Use  . Vaping Use: Never used  Substance and Sexual Activity  . Alcohol use: Yes    Comment: 3 drinks/day - gin or vodka   . Drug use: No  . Sexual activity: Not on file  Other Topics Concern  . Not on file  Social History Narrative  . Not on file   Social Determinants of Health   Financial Resource Strain:   . Difficulty of Paying Living Expenses: Not on file  Food Insecurity:   . Worried About Charity fundraiser in the Last Year: Not on file  . Ran Out of Food in the Last Year: Not on file  Transportation Needs:   . Lack of Transportation (Medical): Not on file  . Lack of Transportation (Non-Medical): Not on file  Physical Activity:   . Days of Exercise per Week: Not on file  . Minutes of Exercise per Session: Not on file  Stress:   . Feeling of Stress : Not on file  Social Connections:   . Frequency of  Communication with Friends and Family: Not on file  . Frequency of Social Gatherings with Friends and Family: Not on file  . Attends Religious Services: Not on file  . Active Member of Clubs or Organizations: Not on file  . Attends Archivist Meetings: Not on file  . Marital Status: Not on file     Family History:  The patient's family history includes Pancreatic cancer in his mother.   ROS:   Please see the history of present illness.    ROS All other systems reviewed and are negative.   PHYSICAL EXAM:   VS:  BP 122/68   Pulse 80   Ht 5\' 10"  (1.778 m)   Wt 155 lb (70.3 kg)   SpO2 92%   BMI 22.24 kg/m   Physical Exam  GEN: Thin, in no acute distress  Neck: no JVD, carotid bruits, or masses Cardiac:RRR; S4, 1/6 systolic murmur LSB Respiratory:  clear to auscultation bilaterally, normal work of breathing GI: soft, nontender, nondistended, + BS Ext: without cyanosis, clubbing, or edema, Good distal pulses bilaterally Neuro:  Alert and Oriented x 3 Psych: euthymic mood, full affect  Wt Readings from Last 3 Encounters:  08/03/20 155 lb (70.3 kg)  07/21/20 169 lb 5 oz (76.8 kg)  09/22/19 169 lb 1.5 oz (76.7 kg)      Studies/Labs Reviewed:   EKG:  EKG is not ordered today.    Recent Labs: 07/21/2020: ALT 20; BUN 20; Creatinine, Ser 0.90; Hemoglobin 14.4; Platelets 172; Potassium 4.4; Sodium 140   Lipid Panel    Component Value Date/Time   CHOL 139 03/22/2019 1200   TRIG 72 03/22/2019 1200   HDL 66 03/22/2019 1200   CHOLHDL 2.1 03/22/2019 1200   VLDL 14 03/22/2019 1200   LDLCALC 59 03/22/2019 1200    Additional studies/ records that were reviewed today include:   Limited echo 05/28/19 IMPRESSIONS      1. The left ventricle has normal systolic function, with an ejection fraction of 60-65%. The cavity size was normal. Left ventricular diastolic Doppler parameters are consistent with impaired relaxation.  Indeterminate filling pressures.  2. Moderate mitral  valve prolapse.  3. The mitral valve is abnormal. Moderate thickening of the mitral valve leaflet. Mild calcification of the mitral valve leaflet. Mitral valve regurgitation is moderate by color flow Doppler. The MR jet is anteriorly-directed.  4. When compared to the prior study: 03/22/19 EF 45-50%. PA pressure 38mmHg, mild to moderate MR.   SUMMARY   Limited for LV function. LVEF 60-65%, there is mitral valve thickening and calcification with late systolic prolapse of the posterior mitral leaflet and moderate anteriorly directed mitral regurgitation.  FINDINGS  Left Ventricle: The left ventricle has normal systolic function, with an ejection fraction of 60-65%. The cavity size was normal. Left ventricular diastolic Doppler parameters are consistent with impaired relaxation (grade I). Indeterminate filling  pressures.     Left Atrium: Left atrial size was normal in size.   Mitral Valve: The mitral valve is abnormal. Moderate thickening of the mitral valve leaflet. Mild calcification of the mitral valve leaflet. Mitral valve regurgitation is moderate by color flow Doppler. The MR jet is anteriorly-directed. There is  moderate late systolic prolapse of of the posterior mitral leaflet of the mitral valve.   Compared to previous exam: 03/22/19 EF 45-50%. PA pressure 64mmHg, mild to moderate MR.        Left heart cath 6/29/20201. No angiographic evidence of CAD   Recommendations: No further ischemic workup.      2D echo 6/28/2020IMPRESSIONS      1. The left ventricle has mildly reduced systolic function, with an ejection fraction of 45-50%. The cavity size was normal. There is moderate concentric left ventricular hypertrophy. Left ventricular diastolic Doppler parameters are consistent with  impaired relaxation. Elevated left ventricular end-diastolic pressure.  2. The right ventricle has normal systolic function. The cavity was normal. There is no increase in right ventricular wall  thickness. Right ventricular systolic pressure is moderately elevated with an estimated pressure of 42.9 mmHg.  3. Trivial pericardial effusion is present.  4. The mitral valve is degenerative. Moderate thickening of the mitral valve leaflet. Mild calcification of the mitral valve leaflet. Mitral valve regurgitation is mild to moderate by color flow Doppler.  5. The aortic valve is tricuspid. Moderate thickening of the aortic valve. Mild calcification of the aortic valve. Aortic valve regurgitation was not assessed by color flow Doppler. Mild stenosis of the aortic valve.  6. The inferior vena cava was dilated in size with >50% respiratory variability.   SUMMARY   When compared to the prior study from 01/15/2017 LVEF has decreased from 60-65% to 45-50% with hypokinesis of the basal and mid inferoseptal and inferior walls.  FINDINGS  Left Ventricle: The left ventricle has mildly reduced systolic function, with an ejection fraction of 45-50%. The cavity size was normal. There is moderate concentric left ventricular hypertrophy. Left ventricular diastolic Doppler parameters are  consistent with impaired relaxation. Elevated left ventricular end-diastolic pressure            ASSESSMENT:    1. Coronary artery disease involving native coronary artery of native heart without angina pectoris   2. Essential hypertension   3. Hyperlipidemia, unspecified hyperlipidemia type   4. OSA on CPAP   5. Mitral valve insufficiency, unspecified etiology   6. Weight loss, unintentional      PLAN:  In order of problems listed above:  CAD S/P NSTEMI with normal coronary arteries on cath 03/23/2019 but new LV dysfunction ejection fraction 45 to 50% with wall motion abnormalities that could  represent mild myocarditis.  Repeat echo 05/28/19 normal LVEF 82-64% with diastolic dysfunction and moderate MR. No angina, doing well   Hypotension/HTN-has not tolerated coreg at all so stopped completely and losartan  decreased  25 mg once daily. Crt 0.98 07/21/20   Hyperlipidemia LDL 53 on 6/30/1 on simvastatin   OSA on CPAP    mod MR, mild AS-asymptomatic  Weight loss 14 lbs in past year unintentional. Spoke with him about increasing calories and staying hydrated. F/u with PCP if he continues to lose weight.    Medication Adjustments/Labs and Tests Ordered: Current medicines are reviewed at length with the patient today.  Concerns regarding medicines are outlined above.  Medication changes, Labs and Tests ordered today are listed in the Patient Instructions below. Patient Instructions  Medication Instructions:  Your physician recommends that you continue on your current medications as directed. Please refer to the Current Medication list given to you today.  *If you need a refill on your cardiac medications before your next appointment, please call your pharmacy*   Lab Work: None If you have labs (blood work) drawn today and your tests are completely normal, you will receive your results only by: Marland Kitchen MyChart Message (if you have MyChart) OR . A paper copy in the mail If you have any lab test that is abnormal or we need to change your treatment, we will call you to review the results.   Follow-Up: At [facility], you and your health needs are our priority.  As part of our continuing mission to provide you with exceptional heart care, we have created designated Provider Care Teams.  These Care Teams include your primary Cardiologist (physician) and Advanced Practice Providers (APPs -  Physician Assistants and Nurse Practitioners) who all work together to provide you with the care you need, when you need it.  We recommend signing up for the patient portal called "MyChart".  Sign up information is provided on this After Visit Summary.  MyChart is used to connect with patients for Virtual Visits (Telemedicine).  Patients are able to view lab/test results, encounter notes, upcoming appointments, etc.   Non-urgent messages can be sent to your provider as well.   To learn more about what you can do with MyChart, go to [email or web address].    Your next appointment:   1 year(s)  The format for your next appointment:   In Person  Provider:   You may see Ena Dawley, MD or one of the following Advanced Practice Providers on your designated Care Team:    Melina Copa, PA-C  Ermalinda Barrios, PA-C       Signed, Ermalinda Barrios, PA-C  08/03/2020 [address], [place], [place]  [address] Phone: [phone]; Fax: [phone]

## 2020-08-03 ENCOUNTER — Encounter: Payer: Self-pay | Admitting: Physician Assistant

## 2020-08-03 ENCOUNTER — Ambulatory Visit: Payer: Medicare Other | Admitting: Physician Assistant

## 2020-08-03 ENCOUNTER — Other Ambulatory Visit: Payer: Self-pay

## 2020-08-03 VITALS — BP 122/68 | HR 80 | Ht 70.0 in | Wt 155.0 lb

## 2020-08-03 DIAGNOSIS — R634 Abnormal weight loss: Secondary | ICD-10-CM

## 2020-08-03 DIAGNOSIS — G4733 Obstructive sleep apnea (adult) (pediatric): Secondary | ICD-10-CM

## 2020-08-03 DIAGNOSIS — E785 Hyperlipidemia, unspecified: Secondary | ICD-10-CM

## 2020-08-03 DIAGNOSIS — I10 Essential (primary) hypertension: Secondary | ICD-10-CM | POA: Diagnosis not present

## 2020-08-03 DIAGNOSIS — I2510 Atherosclerotic heart disease of native coronary artery without angina pectoris: Secondary | ICD-10-CM | POA: Diagnosis not present

## 2020-08-03 DIAGNOSIS — I340 Nonrheumatic mitral (valve) insufficiency: Secondary | ICD-10-CM

## 2020-08-03 DIAGNOSIS — Z9989 Dependence on other enabling machines and devices: Secondary | ICD-10-CM

## 2020-08-03 NOTE — Patient Instructions (Signed)
Medication Instructions:  Your physician recommends that you continue on your current medications as directed. Please refer to the Current Medication list given to you today.  *If you need a refill on your cardiac medications before your next appointment, please call your pharmacy*   Lab Work: None If you have labs (blood work) drawn today and your tests are completely normal, you will receive your results only by: Marland Kitchen MyChart Message (if you have MyChart) OR . A paper copy in the mail If you have any lab test that is abnormal or we need to change your treatment, we will call you to review the results.   Follow-Up: At [facility], you and your health needs are our priority.  As part of our continuing mission to provide you with exceptional heart care, we have created designated Provider Care Teams.  These Care Teams include your primary Cardiologist (physician) and Advanced Practice Providers (APPs -  Physician Assistants and Nurse Practitioners) who all work together to provide you with the care you need, when you need it.  We recommend signing up for the patient portal called "MyChart".  Sign up information is provided on this After Visit Summary.  MyChart is used to connect with patients for Virtual Visits (Telemedicine).  Patients are able to view lab/test results, encounter notes, upcoming appointments, etc.  Non-urgent messages can be sent to your provider as well.   To learn more about what you can do with MyChart, go to [email or web address].    Your next appointment:   1 year(s)  The format for your next appointment:   In Person  Provider:   You may see Ena Dawley, MD or one of the following Advanced Practice Providers on your designated Care Team:    Melina Copa, PA-C  Ermalinda Barrios, PA-C

## 2020-08-05 NOTE — Progress Notes (Signed)
Assessment/Plan:   1.  Parkinsonism.  I suspect that this does represent idiopathic Parkinson's disease, although atypical state is possible (less likely).  The patient has tremor, bradykinesia, rigidity and mild postural instability.  -We discussed the diagnosis as well as pathophysiology of the disease.  We discussed treatment options as well as prognostic indicators.  Patient education was provided.  -We discussed that it used to be thought that levodopa would increase risk of melanoma but now it is believed that Parkinsons itself likely increases risk of melanoma. he is to get regular skin checks.  -Greater than 50% of the 60 minute visit was spent in counseling answering questions and talking about what to expect now as well as in the future.  We talked about medication options as well as potential future surgical options.  We talked about safety in the home.  -We decided to add carbidopa/levodopa 25/100.  1/2 tab tid x 1 wk, then 1/2 in am & noon & 1 at night for a week, then 1/2 in am &1 at noon &night for a week, then 1 po tid.  Risks, benefits, side effects and alternative therapies were discussed.  The opportunity to ask questions was given and they were answered to the best of my ability.  The patient expressed understanding and willingness to follow the outlined treatment protocols.  -pt wants to hold on PT.  Pt moving to [place].  He will let me know if he wants a referral once he moves.  -We discussed community resources in the area including patient support groups and community exercise programs for PD and pt education was provided to the patient.  -Patient did not think that the back surgery caused his symptoms.  Discussed that it is more likely that he had Parkinson's disease prior to going into back surgery and that the anesthesia just brought out some of the tremor.  His wife did not disagree, and she stated that she questioned the diagnosis prior to the surgery.   2.  Alcohol  use  -none in the last few weeks (since emergency room visit for mental status change) but prior to that 3 martinis per day.  Recommend that he drink no more than 2 days/week, but really recommended that he continue to abstain.  3.  Chronic low back pain  -On chronic Dilaudid and managed by pain management.  -Recently in emergency room with mental status change due to baclofen.   -Discussed with patient that I would like to limit medications that cause mental status change as much as possible, including the Pamelor.  The nortriptyline can also contribute to balance issues.  Patient is only using this for sleep and is really on very low dose.  He is going to ask prescribing physician if perhaps he can get off of it.  There are certainly alternatives that may be better.  Subjective:   Aaron Schmidt was seen today in the movement disorders clinic for neurologic consultation at the request of Erline Levine, MD.  The evaluation is for tremor. pts wife supplements hx.   Outside records that were made available to me were reviewed.  Pt is a 86 y.o. male with a hx of chronic LBP on dilaudid (seeing pain management) who presents for the evaluation of LE tremor (shuffling gait noted as well).  Dr. Melven Sartorius records as well as emergency room records from October 28 are reviewed.  Patient in the emergency room for mental status change.  Sounded like he had just been  started on baclofen, in addition to his normal pain medication.  In the emergency room, left foot tremor was noted.  Tremor: Yes.     How long has it been going on? Patient had back surgery September 24, 2019.  Feels like tremor started the day of surgery  At rest or with activation?  rest  Fam hx of tremor?  No. but "my dad had trouble with feet sticking"  Located where?  L leg and foot only per pt but wife notes that he is "shaky" when he eats   Other Specific Symptoms:  Voice: weaker voice x year Sleep: sleeps well  Vivid Dreams:  Yes.     Acting out dreams:  Yes.   Wet Pillows: No. Postural symptoms:  Yes.  , attributes to low back issues  Falls?  Yes.  , 1 time in the last year - in den and "I was just on the floor."  Able to get self up Bradykinesia symptoms: shuffling gait, slow movements and difficulty getting out of a chair Loss of smell:  Yes.   x years Loss of taste:  No. Urinary Incontinence:  No. Difficulty Swallowing:  No. Handwriting, micrographia: Yes.   Trouble with ADL's:  No.  Trouble buttoning clothing: No. Depression:  Yes.  , at times Memory changes:  Yes.  , with names mostly Hallucinations:  No.  visual distortions: No. N/V:  No. Lightheaded:  Yes.   if stands up quickly  Syncope: No. Diplopia:  No. Dyskinesia:  No.  Neuroimaging of the brain has  previously been performed.  It is available for my review today.  CT brain was completed on July 21, 2020.  This demonstrated mod atrophy and small vessel disease.   ALLERGIES:   Allergies  Allergen Reactions  . Baclofen Other (See Comments)    CONFUSION, BODY TREMORS  . Diazepam Other (See Comments)    "makes goofy"    CURRENT MEDICATIONS:  Current Outpatient Medications  Medication Instructions  . aspirin EC 81 mg, Oral, Daily  . docusate sodium (COLACE) 600 mg, Oral, Daily  . finasteride (PROSCAR) 5 mg, Oral, Daily  . fluocinonide cream (LIDEX) 5.80 % 1 application, Topical, Daily PRN  . HYDROmorphone (DILAUDID) 4 mg, Oral, 4 times daily  . L-Lysine 500 mg, Oral, Daily  . losartan (COZAAR) 25 mg, Oral, Daily, Please make yearly appt with Dr. [name] for November for future refills. 1st attempt  . mineral oil liquid 108 mLs, Oral, Daily  . Multiple Vitamin (MULTIVITAMIN WITH MINERALS) TABS tablet 1 tablet, Oral, Daily, Mature mens 44+  . nortriptyline (PAMELOR) 25 mg, Oral, Daily at bedtime  . OVER THE COUNTER MEDICATION 3 tablets, Oral, Daily, Perdiem 15 mg  . polyethylene glycol (MIRALAX / GLYCOLAX) 17 g, Oral, Daily  . pregabalin  (LYRICA) 150 mg, Oral, Daily  . pyridOXINE (VITAMIN B-6) 100 mg, Oral, Daily  . simvastatin (ZOCOR) 40 mg, Oral, Daily at bedtime  . tiZANidine (ZANAFLEX) 4 mg, Oral, 3 times daily  . vitamin B-12 (CYANOCOBALAMIN) 1,000 mcg, Oral, Daily  . vitamin C (ASCORBIC ACID) 500 mg, Oral, Daily  . Vitamin D 2,000 Units, Oral, Daily    Objective:   PHYSICAL EXAMINATION:    VITALS:   Vitals:   08/10/20 0845  BP: 105/61  Pulse: 76  SpO2: 100%  Weight: 155 lb (70.3 kg)  Height: 5' 10.5" (1.791 m)    GEN:  The patient appears stated age and is in NAD. HEENT:  Normocephalic, atraumatic.  The  mucous membranes are moist. The superficial temporal arteries are without ropiness or tenderness. CV:  RRR Lungs:  CTAB Neck/HEME:  There are no carotid bruits bilaterally.  Neurological examination:  Orientation: The patient is alert and oriented x3.  Cranial nerves: There is good facial symmetry.  There is facial hypomimia.   Extraocular muscles are intact. The visual fields are full to confrontational testing. The speech is fluent and clear but hypophonic. Soft palate rises symmetrically and there is no tongue deviation. Hearing is intact to conversational tone. Sensation: Sensation is intact to light touch throughout (facial, trunk, extremities). Vibration is intact at the bilateral big toe. There is no extinction with double simultaneous stimulation.  Motor: Strength is 5/5 in the bilateral upper and lower extremities.   Shoulder shrug is equal and symmetric.  There is no pronator drift. Deep tendon reflexes: Deep tendon reflexes are 2/4 at the bilateral biceps, triceps, brachioradialis, 1/4 at the bilateral patella and achilles. Plantar responses are downgoing bilaterally.  Movement examination: Tone: There is mod increased tone in the LUE and bilateral LE.  Tone in the RUE is normal.   Abnormal movements: tremor is felt in the LUE/LLE with distraction (not seen) Coordination:  There is decremation  with RAM's, with any form of RAMS, including alternating supination and pronation of the forearm, hand opening and closing, finger taps, heel taps and toe taps on the L Gait and Station: The patient has mild difficulty arising out of a deep-seated chair without the use of the hands.  He has to sit on the very edge and then has to put his nose over the toes and then is able to arise.  The patient's stride length is decreased and he shuffles the heels.  He uses a cane to ambulate.  He turns en bloc.   I have reviewed and interpreted the following labs independently   Chemistry      Component Value Date/Time   NA 140 07/21/2020 2119   NA 142 06/30/2019 0905   K 4.4 07/21/2020 2119   CL 102 07/21/2020 2119   CO2 28 07/21/2020 2119   BUN 20 07/21/2020 2119   BUN 23 06/30/2019 0905   CREATININE 0.90 07/21/2020 2119      Component Value Date/Time   CALCIUM 9.8 07/21/2020 2119   ALKPHOS 69 07/21/2020 2119   AST 56 (H) 07/21/2020 2119   ALT 20 07/21/2020 2119   BILITOT 1.1 07/21/2020 2119      Lab Results  Component Value Date   TSH 0.731 09/21/2017   Lab Results  Component Value Date   WBC 7.1 07/21/2020   HGB 14.4 07/21/2020   HCT 43.6 07/21/2020   MCV 94.6 07/21/2020   PLT 172 07/21/2020      Total time spent on today's visit was 75 minutes, including both face-to-face time and nonface-to-face time.  Time included that spent on review of records (prior notes available to me/labs/imaging if pertinent), discussing treatment and goals, answering patient's questions and coordinating care.  Cc:  Hulan Fess, MD

## 2020-08-10 ENCOUNTER — Ambulatory Visit: Payer: Medicare Other | Admitting: Neurology

## 2020-08-10 ENCOUNTER — Encounter: Payer: Self-pay | Admitting: Neurology

## 2020-08-10 ENCOUNTER — Other Ambulatory Visit: Payer: Self-pay

## 2020-08-10 VITALS — BP 105/61 | HR 76 | Ht 70.5 in | Wt 155.0 lb

## 2020-08-10 DIAGNOSIS — G20 Parkinson's disease: Secondary | ICD-10-CM

## 2020-08-10 MED ORDER — CARBIDOPA-LEVODOPA 25-100 MG PO TABS: 1.0000 | ORAL_TABLET | Freq: Three times a day (TID) | ORAL | 1 refills | Status: DC

## 2020-08-10 NOTE — Patient Instructions (Signed)
1.  Start Carbidopa Levodopa as follows:  Take 1/2 tablet three times daily, at least 30 minutes before meals (approximately 7am/11am/4pm), for one week  Then take 1/2 tablet in the morning, 1/2 tablet in the afternoon, 1 tablet in the evening, at least 30 minutes before meals, for one week  Then take 1/2 tablet in the morning, 1 tablet in the afternoon, 1 tablet in the evening, at least 30 minutes before meals, for one week  Then take 1 tablet three times daily at 7am/11am/4pm, at least 30 minutes before meals   As a reminder, carbidopa/levodopa can be taken at the same time as a carbohydrate, but we like to have you take your pill either 30 minutes before a protein source or 1 hour after as protein can interfere with carbidopa/levodopa absorption. 2.  Let us know when you would like a referral for PT  Parkinsons [employer]   . Local [place] Online Groups  o Power over [employer] Group :   - Power Over Parkinson's Patient Education Group will be Wednesday, November 10th at 2pm via [place].   - Upcoming Power over Parkinson's Meetings:  2nd Wednesdays of the month at 2 pm:       December 8th, January 12th - Amy Marriott, PT at [facility] has resumed the lead of this group starting in July.  Contact Amy at [email or web address] if interested in participating in this online group o Parkinson's Care Partners Group:    3rd Mondays, Contact Corwin Levins o Atypical Parkinsonian Patient Group:   4th Wednesdays, Contact Corwin Levins o If you are interested in participating in these online groups with Judson Roch, please contact her directly for how to join those meetings.  Her contact information is [email or web address].  She will send you a link to join the [employer].  (Please note that Corwin Levins , MSW, LCSW, has resigned her position at [facility], but will continue to lead the online groups temporarily) .  Marland Kitchen [place]:   [email or web address] o PD Health at Home continues:  Mindfulness Mondays, Expert Briefing Tuesdays, Wellness Wednesdays, Take Time Thursdays, Fitness Fridays  o Upcoming Webinar:  The Skinny on Skin and Bone Health in Parkinson's.  Wednesday, December 1st at 1 pm o Please check out their website to sign up for emails and see their full online offerings .  Marland Kitchen [place]:  [email or web address]  o Upcoming Webinar:   Steps Closer to Stopping Parkinson's:  2021 Research Review, Thursday, November 18th at 12 noon. o Check out additional information on their website to see their full online offerings .  Marland Kitchen [place]:  [email or web address] o Upcoming Webinar:  Non-Motor Symptom Medications in Parkinson's.  Wednesday, November 10th at 2 pm. o Care Partner Monthly Meetup.  With Robin Searing [place].  First Tuesday of each month, 2 pm o Check out additional information to Live Well Today on their website .  Marland Kitchen Parkinson and Movement Disorders (PMD) Alliance:  www.[email or web address] o [employer] Online:  Online Education Events o Sign up for emails, which are sent weekly to give you updates on programming and online offerings .  Marland Kitchen Parkinson's Association of the [place]:  www.[email or web address] o Information on online support groups, online exercises including Yoga, Parkinson's exercises and more-LOTS of information on links to PD resources and online events o Virtual Support Group through Parkinson's Association of the [place]; next one is scheduled for Wednesday, August 24, 2020 at 2 pm. (These are typically scheduled for the 1st Wednesday  of the month at 2 pm).  Visit website for details. .  . Additional links for movement activities: o PWR! Moves Classes at [place] RESUMED, at a limited capacity.  We have several openings for Wednesday 10 am and 11 am classes.  Contact Amy Marriott, PT [email or web address] or [phone] if  interested o Here is a link to the PWR!Moves classes on Zoom from [place] - Daily Mon-Sat at 10:00. Via Zoom, FREE and open to all.  There is also a link below via Facebook if you use that platform. - [email or web address].si - [email or web address].no o Parkinson's Wellness Recovery (PWR! Moves)  www.[email or web address] - Info on the PWR! Virtual Experience:  You will have access to our expertise through self-assessment, guided plans that start with the PD-specific fundamentals, educational content, tips, Q&A with an expert, and a growing Art therapist of PD-specific pre-recorded and live exercise classes of varying types and intensity - both physical and cognitive! If that is not enough, we offer 1:1 wellness consultations (in-person or virtual) to personalize your PWR! Research scientist (medical).  - Check out the PWR! Move of the month on the [place] Recovery website:  [email or web address] o [employer] Fridays:  - As part of the PD Health @ Home program, this free video series focuses each week on one aspect of fitness designed to support people living with Parkinson's.  -  [email or web address].dk o Dance for PD website is offering free, live-stream classes throughout the week, as well as links to [employer] of classes:  [email or web address] o Transport planner for Parkinson's Class:  [place] is back this Fall!  Free offering for people with Parkinson's and care partners; virtual class this Fall. The class will be Wednesdays 4-5pm beginning 10/13.  Classes will run for 9 weeks 10/13-12/15, with no class on 11/24.  Register  below: o [email or web address]+Dance-[email or web address]  o For more information, contact [phone] or email Ruffin Frederick at [email or web address] o Virtual dance and Pilates for Parkinson's classes: Click on the Community Tab> Parkinson's Movement Initiative Tab.  To register for classes and for more information, visit [email or web address] and click the "community" tab.  o [employer] Parkinson's Cycling Classes  - [facility]: 1pm on Fridays-Live classes at [employer] at [email or web address] or [phone]) [facility]: Virtual Classes Mondays and Thursdays (contact [place] at [email or web address] or [phone]) .  o [employer] - Three levels of classes are offered Tuesdays and Thursdays:  10:30 am,  12 noon & 1:45 pm at [employer]. To observe a class or for  more information, call [phone] or email [email or web address] . [employer]: o Chief Technology Officer Opportunities:  www.well-springsolutions.org/caregiver-education/caregiver-support-group.  You may also contact Vickki Muff at [email or web address] or [phone].   o Caregiver Virtual Event:   [facility] is Partnering with [employer], on Friday, November 19th from 11:30-12:30 for a virtual event - Contact 10-05-1998 (above) for details o Well-Spring Navigator:  Just1Navigator program, a free service to help individuals and families through the journey of determining care for older adults.  The "Navigator" is a Vickki Muff, Education officer, museum, who will speak with a prospective client and/or loved ones to provide an assessment of the situation and a set of recommendations for a personalized care plan -- all free of charge, and whether [facility] [employer] offers the needed service or not. If the need is not a service we provide, we are  well-connected with reputable programs in town that we can refer you to.  www.well-springsolutions.org  or to speak with the Navigator, call [phone].

## 2020-08-17 ENCOUNTER — Emergency Department (HOSPITAL_BASED_OUTPATIENT_CLINIC_OR_DEPARTMENT_OTHER): Payer: Medicare Other

## 2020-08-17 ENCOUNTER — Other Ambulatory Visit: Payer: Self-pay

## 2020-08-17 ENCOUNTER — Encounter (HOSPITAL_BASED_OUTPATIENT_CLINIC_OR_DEPARTMENT_OTHER): Payer: Self-pay

## 2020-08-17 ENCOUNTER — Emergency Department (HOSPITAL_BASED_OUTPATIENT_CLINIC_OR_DEPARTMENT_OTHER)
Admission: EM | Admit: 2020-08-17 | Discharge: 2020-08-17 | Disposition: A | Discharge status: Home / Self Care | Payer: Medicare Other | Attending: Emergency Medicine | Admitting: Emergency Medicine

## 2020-08-17 DIAGNOSIS — I2510 Atherosclerotic heart disease of native coronary artery without angina pectoris: Secondary | ICD-10-CM | POA: Insufficient documentation

## 2020-08-17 DIAGNOSIS — I10 Essential (primary) hypertension: Secondary | ICD-10-CM | POA: Diagnosis not present

## 2020-08-17 DIAGNOSIS — Z87891 Personal history of nicotine dependence: Secondary | ICD-10-CM | POA: Insufficient documentation

## 2020-08-17 DIAGNOSIS — R0789 Other chest pain: Secondary | ICD-10-CM | POA: Insufficient documentation

## 2020-08-17 DIAGNOSIS — M545 Low back pain, unspecified: Secondary | ICD-10-CM | POA: Diagnosis not present

## 2020-08-17 DIAGNOSIS — Z79899 Other long term (current) drug therapy: Secondary | ICD-10-CM | POA: Insufficient documentation

## 2020-08-17 DIAGNOSIS — Z7982 Long term (current) use of aspirin: Secondary | ICD-10-CM | POA: Insufficient documentation

## 2020-08-17 DIAGNOSIS — G20 Parkinson's disease: Secondary | ICD-10-CM | POA: Insufficient documentation

## 2020-08-17 DIAGNOSIS — Y92000 Kitchen of unspecified non-institutional (private) residence as  the place of occurrence of the external cause: Secondary | ICD-10-CM | POA: Insufficient documentation

## 2020-08-17 DIAGNOSIS — M549 Dorsalgia, unspecified: Secondary | ICD-10-CM | POA: Diagnosis present

## 2020-08-17 DIAGNOSIS — W0110XA Fall on same level from slipping, tripping and stumbling with subsequent striking against unspecified object, initial encounter: Secondary | ICD-10-CM | POA: Insufficient documentation

## 2020-08-17 DIAGNOSIS — Y9301 Activity, walking, marching and hiking: Secondary | ICD-10-CM | POA: Insufficient documentation

## 2020-08-17 DIAGNOSIS — W19XXXA Unspecified fall, initial encounter: Secondary | ICD-10-CM

## 2020-08-17 DIAGNOSIS — M7918 Myalgia, other site: Secondary | ICD-10-CM

## 2020-08-17 HISTORY — DX: Parkinson's disease: G20

## 2020-08-17 HISTORY — DX: Parkinson's disease without dyskinesia, without mention of fluctuations: G20.A1

## 2020-08-17 MED ORDER — LIDOCAINE 5 % EX PTCH: 1.0000 | MEDICATED_PATCH | CUTANEOUS | 0 refills | Status: DC

## 2020-08-17 MED ORDER — LIDOCAINE 5 % EX PTCH
1.0000 | MEDICATED_PATCH | CUTANEOUS | Status: DC
  Administered 2020-08-17: 1 via TRANSDERMAL
  Filled 2020-08-17: qty 1

## 2020-08-17 MED ORDER — FENTANYL CITRATE (PF) 100 MCG/2ML IJ SOLN
50.0000 ug | Freq: Once | INTRAMUSCULAR | Status: AC
  Administered 2020-08-17: 50 ug via INTRAMUSCULAR
  Filled 2020-08-17: qty 2

## 2020-08-17 MED ORDER — OXYCODONE-ACETAMINOPHEN 5-325 MG PO TABS
1.0000 | ORAL_TABLET | Freq: Once | ORAL | Status: AC
  Administered 2020-08-17: 1 via ORAL
  Filled 2020-08-17: qty 1

## 2020-08-17 NOTE — ED Triage Notes (Signed)
Pt states he slipped on steps ~1215p-pain to right shoulder and right posterior mid back-NAD-to triage in w/c

## 2020-08-17 NOTE — ED Provider Notes (Signed)
[facility] EMERGENCY DEPARTMENT Provider Note   CSN: [id number] Arrival date & time: 08/17/20  1353     History Chief Complaint  Patient presents with  . Fall    Aaron Schmidt is a 86 y.o. male possible history of MI, hypertension who presents for evaluation of low back pain, right shoulder pain after mechanical fall that occurred about 1215 this afternoon.  He reports that he was walking from the kitchen to his garage and states that he tripped and fell, landing on his back.  He thinks he hit his head but did not have any LOC.  He does not think he is on any blood thinners.  He has been able to ambulate since this fall.  He denies any preceding chest pain or dizziness that caused the fall.  He states that since the fall, he has had pain to his lower back that is worse when he tries to move, bend.  He also reports pain to his right shoulder that is worse with movement.  He denies any vision changes, nausea/vomiting, numbness/weakness of his arms or legs, chest pain, difficulty breathing, abdominal pain, nausea/vomiting.  The history is provided by the patient.       Past Medical History:  Diagnosis Date  . Arthritis    Degeneration spine & stenosis  . History of kidney stones   . Hypertension    has a histroy of  . Hypotension   . Myocardial infarction ([place]) 03/21/2019  . Parkinson's disease ([place])   . Sleep apnea 2012   used CPAP 2 yrs. ago, feels he sleeps better w/o, no longer using     Patient Active Problem List   Diagnosis Date Noted  . Degenerative lumbar spinal stenosis 09/22/2019  . Hypotension 06/23/2019  . Myocarditis ([place]) 05/19/2019  . CAD (coronary artery disease) 04/20/2019  . Obesity 04/06/2019  . Malnutrition of moderate degree 03/23/2019  . NSTEMI (non-ST elevated myocardial infarction) ([place]) 03/21/2019  . Back pain 03/21/2019  . Anemia 09/21/2017  . Symptomatic anemia 09/20/2017  . Protein-calorie malnutrition, severe 09/11/2017  .  Empyema lung ([place]) 09/09/2017  . SIRS (systemic inflammatory response syndrome) ([place]) 09/05/2017  . Elevated troponin 09/05/2017  . Normochromic normocytic anemia 09/05/2017  . Hyponatremia 09/05/2017  . Acute kidney injury superimposed on chronic kidney disease ([place]) 09/05/2017  . Chronic pain 09/05/2017  . Pleural effusion, left 09/04/2017  . Pleural effusion on left 08/19/2017  . Sinusitis, chronic 08/19/2017  . Murmur 01/08/2017  . Skin ulcer of toe of left foot, limited to breakdown of skin ([place]) 09/04/2016  . Cervical myelopathy with cervical radiculopathy ([place]) 06/24/2014  . LBBB (left bundle branch block) 06/21/2014  . Hyperlipidemia 06/21/2014  . Preoperative cardiovascular examination 06/21/2014  . OBSTRUCTIVE SLEEP APNEA 09/30/2010  . Essential hypertension 08/29/2010    Past Surgical History:  Procedure Laterality Date  . ANTERIOR LAT LUMBAR FUSION N/A 09/22/2019   Procedure: Lumbar one-two, Lumbar two-three Anterolateral lumbar interbody fusion with pedicle screw fixation and exploration of adjacent fusion;  Surgeon: Erline Levine, MD;  Location: [facility];  Service: Neurosurgery;  Laterality: N/A;  . ARM NEUROPLASTY     at 14 yrs. of age- fell off tractor- had fracture & repair *& later- 42's had  transplantation of a nerve at the elbow  . BACK SURGERY     x4 back surgery x2 fusion -  . CARDIAC CATHETERIZATION  03/23/2019  . CARPAL TUNNEL RELEASE Right   . COLONOSCOPY    . ESOPHAGOGASTRODUODENOSCOPY (  EGD) WITH PROPOFOL N/A 09/23/2017   Procedure: ESOPHAGOGASTRODUODENOSCOPY (EGD) WITH PROPOFOL;  Surgeon: Laurence Spates, MD;  Location: [facility];  Service: Endoscopy;  Laterality: N/A;  . LEFT HEART CATH AND CORONARY ANGIOGRAPHY N/A 03/23/2019   Procedure: LEFT HEART CATH AND CORONARY ANGIOGRAPHY;  Surgeon: Burnell Blanks, MD;  Location: [facility];  Service: Cardiovascular;  Laterality: N/A;  . LUMBAR PERCUTANEOUS PEDICLE SCREW 2 LEVEL N/A 09/22/2019    Procedure: LUMBAR PERCUTANEOUS PEDICLE SCREW TWO LEVEL;  Surgeon: Erline Levine, MD;  Location: [facility];  Service: Neurosurgery;  Laterality: N/A;  . PLEURAL EFFUSION DRAINAGE Left 09/09/2017   Procedure: DRAINAGE OF PLEURAL EFFUSION;  Surgeon: [name], MD;  Location: [facility];  Service: Thoracic;  Laterality: Left;  . POSTERIOR CERVICAL FUSION/FORAMINOTOMY N/A 06/24/2014   Procedure: Posterior Cervical Three-Seven Fusion with Lateral Mass Fixation;  Surgeon: Erline Levine, MD;  Location: [facility];  Service: Neurosurgery;  Laterality: N/A;  C3-C7 posterior cervical fusion with lateral mass fixation  . SHOULDER SURGERY Bilateral   . VIDEO ASSISTED THORACOSCOPY (VATS)/DECORTICATION Left 09/09/2017   Procedure: VIDEO ASSISTED THORACOSCOPY (VATS)/DECORTICATION;  Surgeon: [name], MD;  Location: [facility];  Service: Thoracic;  Laterality: Left;  Marland Kitchen VIDEO BRONCHOSCOPY  09/09/2017   Procedure: VIDEO BRONCHOSCOPY;  Surgeon: [name], MD;  Location: [facility];  Service: Thoracic;;       Family History  Problem Relation Age of Onset  . Pancreatic cancer Mother   . Leukemia Son     Social History   Tobacco Use  . Smoking status: Former Smoker    Years: 24.00    Types: Pipe    Quit date: 09/24/1978    Years since quitting: 41.9  . Smokeless tobacco: Never Used  Vaping Use  . Vaping Use: Never used  Substance Use Topics  . Alcohol use: Yes    Comment: 3 drinks/day - gin or vodka   . Drug use: No    Home Medications Prior to Admission medications   Medication Sig Start Date End Date Taking? Authorizing Provider  aspirin EC 81 MG tablet Take 81 mg by mouth daily.    [provider]  carbidopa-levodopa (SINEMET IR) 25-100 MG tablet Take 1 tablet by mouth 3 (three) times daily. 7am/11am/4pm 08/10/20   Tat, Eustace Quail, DO  Cholecalciferol (VITAMIN D) 2000 UNITS CAPS Take 2,000 Units by mouth daily.     [provider]  docusate sodium (COLACE) 100  MG capsule Take 600 mg by mouth daily.    [provider]  finasteride (PROSCAR) 5 MG tablet Take 5 mg by mouth daily.    [provider]  fluocinonide cream (LIDEX) 3.15 % Apply 1 application topically daily as needed (itching).    [provider]  HYDROmorphone (DILAUDID) 2 MG tablet Take 2 tablets (4 mg total) by mouth 4 (four) times daily. Patient taking differently: Take 4 mg by mouth. Take 1 tab every 5 hours as needed for pain do not exceed more than 5 per day 09/24/19   Erline Levine, MD  L-Lysine 500 MG CAPS Take 500 mg by mouth daily.     [provider]  lidocaine (LIDODERM) 5 % Place 1 patch onto the skin daily. Remove & Discard patch within 12 hours or as directed by MD 08/17/20   Volanda Napoleon, PA-C  losartan (COZAAR) 25 MG tablet Take 1 tablet (25 mg total) by mouth daily. Please make yearly appt with Dr. [name] for November for  future refills. 1st attempt 06/06/20   Dorothy Spark, MD  mineral oil liquid Take 108 mLs by mouth daily in the afternoon.     [provider]  Multiple Vitamin (MULTIVITAMIN WITH MINERALS) TABS tablet Take 1 tablet by mouth daily. Mature mens 50+    [provider]  nortriptyline (PAMELOR) 25 MG capsule Take 25 mg by mouth at bedtime.     [provider]  OVER THE COUNTER MEDICATION Take 3 tablets by mouth daily. Perdiem 15 mg     [provider]  polyethylene glycol (MIRALAX / GLYCOLAX) packet Take 17 g by mouth daily. 10/13/17   Sherwood Gambler, MD  pregabalin (LYRICA) 150 MG capsule Take 150 mg by mouth daily.     [provider]  pyridOXINE (VITAMIN B-6) 100 MG tablet Take 100 mg by mouth daily.    [provider]  simvastatin (ZOCOR) 40 MG tablet Take 40 mg by mouth at bedtime.     [provider]  tiZANidine (ZANAFLEX) 2 MG tablet Take 4 mg by mouth 3 (three) times daily.     [provider]  vitamin B-12 (CYANOCOBALAMIN) 1000 MCG tablet  Take 1,000 mcg by mouth daily.     [provider]  vitamin C (ASCORBIC ACID) 500 MG tablet Take 500 mg by mouth daily.    [provider]    Allergies    Baclofen and Diazepam  Review of Systems   Review of Systems  Respiratory: Negative for shortness of breath.   Cardiovascular: Negative for chest pain.  Gastrointestinal: Negative for nausea and vomiting.  Musculoskeletal:       Back pain Right shoulder pain  Neurological: Negative for weakness, numbness and headaches.  All other systems reviewed and are negative.   Physical Exam Updated Vital Signs BP (!) 168/76 (BP Location: Right Arm)   Pulse 89   Temp 98.6 F (37 C) (Oral)   Resp 16   Ht 5\' 10"  (1.778 m)   Wt 70.3 kg   SpO2 95%   BMI 22.24 kg/m   Physical Exam Vitals and nursing note reviewed.  Constitutional:      Appearance: Normal appearance. He is well-developed.  HENT:     Head: Normocephalic and atraumatic.     Comments: No tenderness to palpation of skull. No deformities or crepitus noted. No open wounds, abrasions or lacerations.  Eyes:     General: Lids are normal.     Conjunctiva/sclera: Conjunctivae normal.     Pupils: Pupils are equal, round, and reactive to light.     Comments: PERRL. EOMs intact. No nystagmus. No neglect.   Neck:     Comments: Tenderness palpation of the midline C-spine approxi-C4-C5 level.  No deformity crepitus noted.  Diffuse paraspinal muscle tenderness noted to the right paraspinal muscles. Cardiovascular:     Rate and Rhythm: Normal rate and regular rhythm.     Pulses: Normal pulses.          Radial pulses are 2+ on the right side and 2+ on the left side.     Heart sounds: Normal heart sounds. No murmur heard.  No friction rub. No gallop.   Pulmonary:     Effort: Pulmonary effort is normal.     Breath sounds: Normal breath sounds.     Comments: Lungs clear to auscultation bilaterally.  Symmetric chest rise.  No wheezing, rales, rhonchi. Abdominal:      Palpations: Abdomen is soft. Abdomen is not rigid.  Tenderness: There is no abdominal tenderness. There is no guarding.     Comments: Abdomen is soft, non-distended, non-tender. No rigidity, No guarding. No peritoneal signs.  Musculoskeletal:        General: Normal range of motion.     Comments: No midline T-spine tenderness.  No deformity or crepitus noted.  Diffuse tenderness palpation noted to the lower lumbar region.  He does have midline L-spine tenderness.  No deformity or step-offs noted.  Tenderness palpation of the right shoulder.  No deformity or crepitus noted.  Limited range of motion secondary to pain.  No bony tenderness noted right elbow, right forearm, right wrist.  No tenderness palpation noted to left upper extremity.  No pelvic instability.  No tenderness palpation of bilateral femurs, knees, tib-fib, ankles.  Skin:    General: Skin is warm and dry.     Capillary Refill: Capillary refill takes less than 2 seconds.  Neurological:     Mental Status: He is alert and oriented to person, place, and time.     Comments: Cranial nerves III-XII intact Follows commands, Moves all extremities  5/5 strength to BUE and BLE  Sensation intact throughout all major nerve distributions Normal finger to nose. No slurred speech. No facial droop.   Psychiatric:        Speech: Speech normal.     ED Results / Procedures / Treatments   Labs (all labs ordered are listed, but only abnormal results are displayed) Labs Reviewed - No data to display  EKG None  Radiology DG Lumbar Spine Complete  Result Date: 08/17/2020 CLINICAL DATA:  Back pain, slipped on steps RIGHT posterior mid back. History of multiple lumbar spine surgeries. EXAM: LUMBAR SPINE - COMPLETE 4+ VIEW COMPARISON:  03/21/2020 FINDINGS: Osteopenia. Signs of posterior lumbar fusion and interbody cage placement spanning L1 through L3 similar to prior studies. LEFT lateral fusion of findings are similar to the prior exam.  Spinal fusion hardware is unchanged. Spinal degenerative changes. The no sign of acute fracture or static subluxation. Diffuse mild gaseous distension of small bowel loops similar to the prior exam. IMPRESSION: Posterior and lateral lumbar spinal fusion with similar appearance to the previous exam. No signs of fracture or static subluxation. Mild diffuse gaseous distension of small bowel and colon may reflect developing ileus. Correlate with any abdominal symptoms. Electronically Signed   By: Zetta Bills M.D.   On: 08/17/2020 15:50   DG Shoulder Right  Result Date: 08/17/2020 CLINICAL DATA:  Right shoulder pain after fall. EXAM: RIGHT SHOULDER - 2+ VIEW COMPARISON:  None. FINDINGS: There is no evidence of fracture or dislocation. Mild degenerative changes seen involving the right glenohumeral joint. Soft tissues are unremarkable. IMPRESSION: Mild osteoarthritis of the right glenohumeral joint. No acute abnormality is noted. Electronically Signed   By: Marijo Conception M.D.   On: 08/17/2020 15:44   CT Head Wo Contrast  Result Date: 08/17/2020 CLINICAL DATA:  Recent fall with headaches and neck pain, initial encounter EXAM: CT HEAD WITHOUT CONTRAST CT CERVICAL SPINE WITHOUT CONTRAST TECHNIQUE: Multidetector CT imaging of the head and cervical spine was performed following the standard protocol without intravenous contrast. Multiplanar CT image reconstructions of the cervical spine were also generated. COMPARISON:  07/21/2020, C-spine film from 08/30/2014, MRI from 05/19/2014 FINDINGS: CT HEAD FINDINGS Brain: No evidence of acute infarction, hemorrhage, hydrocephalus, extra-axial collection or mass lesion/mass effect. Chronic atrophic and ischemic changes are noted Vascular: No hyperdense vessel or unexpected calcification. Skull: Normal. Negative for fracture or  focal lesion. Sinuses/Orbits: No acute finding. Other: None. CT CERVICAL SPINE FINDINGS Alignment: Within normal limits. Skull base and vertebrae:  7 cervical segments are well visualized. Wide laminectomy is noted from C3 to C6. Posterior fusion elements are noted bilaterally from C3-C7. These are stable in appearance from the prior exam from 2015. The superior most aspect of the posterior fixation rod however is not within the C3 screw. This is however stable in retrospect from the prior exam. Multilevel facet hypertrophic changes are noted. No acute fracture or acute facet abnormality is noted. Hemangioma is again noted in the right lateral aspect of the C5 vertebral body stable from a prior MRI examination. Degenerative changes at the C1-2 articulation are noted with pannus formation. No acute fracture is seen. Large anterior osteophytes are again seen in the cervical spine. Soft tissues and spinal canal: Surrounding soft tissue structures appear within normal limits. Upper chest: Visualized lung apices are unremarkable. Other: None IMPRESSION: CT of the head: Chronic atrophic and ischemic changes similar to that seen on the prior exam. CT of the cervical spine: Postsurgical and degenerative changes similar to that seen on previous exams. The superior most aspect of the fixation rod on the right is separated from the C3 fixation screw although this is stable in retrospect from 2015. No acute fracture is noted. Electronically Signed   By: Inez Catalina M.D.   On: 08/17/2020 15:26   CT Chest Wo Contrast  Result Date: 08/17/2020 CLINICAL DATA:  Status post fall. EXAM: CT CHEST WITHOUT CONTRAST TECHNIQUE: Multidetector CT imaging of the chest was performed following the standard protocol without IV contrast. COMPARISON:  March 21, 2019 FINDINGS: Cardiovascular: There is moderate severity calcification of the aortic arch. Normal heart size. A very small amount of anterior pericardial fluid is seen. Mediastinum/Nodes: No enlarged mediastinal or axillary lymph nodes. Thyroid gland, trachea, and esophagus demonstrate no significant findings. Lungs/Pleura: Mild  to moderate severity linear scarring and/or atelectasis is seen within the left lower lobe. Very mild patchy atelectasis and/or early infiltrate is seen within the posteromedial aspect of the right middle lobe. 1 mm and 2 mm calcified lung nodules are seen within the right lower lobe. There is no evidence of a pleural effusion or pneumothorax. Upper Abdomen: A stable 1.0 cm low-attenuation right adrenal mass is seen. A stable 1.2 cm low-attenuation left adrenal mass is also noted. Musculoskeletal: A chronic posterolateral tenth right rib fracture is seen. Additional chronic deformity involving the first left rib is noted. Bilateral metallic density pedicle screws are seen within the visualized portion of the lumbar spine. IMPRESSION: 1. Mild to moderate severity left lower lobe linear scarring and/or atelectasis. 2. Very mild right middle lobe atelectasis and/or early infiltrate. 3. Stable bilateral low-attenuation adrenal nodules, likely representing adrenal adenomas. 4. Chronic bilateral rib fractures. 5. Aortic atherosclerosis. Aortic Atherosclerosis (ICD10-I70.0). Electronically Signed   By: Virgina Norfolk M.D.   On: 08/17/2020 17:44   CT Cervical Spine Wo Contrast  Result Date: 08/17/2020 CLINICAL DATA:  Recent fall with headaches and neck pain, initial encounter EXAM: CT HEAD WITHOUT CONTRAST CT CERVICAL SPINE WITHOUT CONTRAST TECHNIQUE: Multidetector CT imaging of the head and cervical spine was performed following the standard protocol without intravenous contrast. Multiplanar CT image reconstructions of the cervical spine were also generated. COMPARISON:  07/21/2020, C-spine film from 08/30/2014, MRI from 05/19/2014 FINDINGS: CT HEAD FINDINGS Brain: No evidence of acute infarction, hemorrhage, hydrocephalus, extra-axial collection or mass lesion/mass effect. Chronic atrophic and ischemic changes are noted Vascular:  No hyperdense vessel or unexpected calcification. Skull: Normal. Negative for  fracture or focal lesion. Sinuses/Orbits: No acute finding. Other: None. CT CERVICAL SPINE FINDINGS Alignment: Within normal limits. Skull base and vertebrae: 7 cervical segments are well visualized. Wide laminectomy is noted from C3 to C6. Posterior fusion elements are noted bilaterally from C3-C7. These are stable in appearance from the prior exam from 2015. The superior most aspect of the posterior fixation rod however is not within the C3 screw. This is however stable in retrospect from the prior exam. Multilevel facet hypertrophic changes are noted. No acute fracture or acute facet abnormality is noted. Hemangioma is again noted in the right lateral aspect of the C5 vertebral body stable from a prior MRI examination. Degenerative changes at the C1-2 articulation are noted with pannus formation. No acute fracture is seen. Large anterior osteophytes are again seen in the cervical spine. Soft tissues and spinal canal: Surrounding soft tissue structures appear within normal limits. Upper chest: Visualized lung apices are unremarkable. Other: None IMPRESSION: CT of the head: Chronic atrophic and ischemic changes similar to that seen on the prior exam. CT of the cervical spine: Postsurgical and degenerative changes similar to that seen on previous exams. The superior most aspect of the fixation rod on the right is separated from the C3 fixation screw although this is stable in retrospect from 2015. No acute fracture is noted. Electronically Signed   By: Inez Catalina M.D.   On: 08/17/2020 15:26    Procedures Procedures (including critical care time)  Medications Ordered in ED Medications  oxyCODONE-acetaminophen (PERCOCET/ROXICET) 5-325 MG per tablet 1 tablet (1 tablet Oral Given 08/17/20 1434)  fentaNYL (SUBLIMAZE) injection 50 mcg (50 mcg Intramuscular Given 08/17/20 1729)    ED Course  I have reviewed the triage vital signs and the nursing notes.  Pertinent labs & imaging results that were available  during my care of the patient were reviewed by me and considered in my medical decision making (see chart for details).    MDM Rules/Calculators/A&P                          86 year old male who presents for evaluation of mechanical fall that occurred at approxitwelve 15 this afternoon.  He was walking down into his garage and states that he tripped and fell, landing on his back.  He thinks he hit his head.  No LOC.  He does not think he is on blood thinners.  On initially arrival, he is afebrile, nontoxic-appearing.  Vital signs are stable.  On exam, he has tenderness palpation of the lower back as well as neck, right shoulder.  Given that he hit his head, we will plan for imaging of his head, neck, shoulder and lumbar spine.  CT of head negative for any acute abnormality.  CT cervical spine shows no evidence of acute abnormality.  He has postsurgical degenerative changes similar to this seen on previous exam.  X-ray of lumbar spine shows no evidence of acute fracture or dislocation.  Shoulder  x-ray shows osteoarthritis but no evidence of acute bony abnormality.  Patient still having pain after Percocet here in the ED.  He states that he feels like it is more in the chest wall.  Plan for imaging of the chest for eval of potential rib fracture.  Chest CT shows no evidence of acute rib fractures.  He has chronic bilateral rib fractures.  There is some atelectasis noted as well.  Discussed results with patient.  We will plan to give lidocaine patch for pain.  Encouraged at home supportive care measures. At this time, patient exhibits no emergent life-threatening condition that require further evaluation in ED. Discussed patient with Dr. Billy Fischer who is agreeable. Patient had ample opportunity for questions and discussion. All patient's questions were answered with full understanding. Strict return precautions discussed. Patient expresses understanding and agreement to plan.   Portions of this note  were generated with [employer]. Dictation errors may occur despite best attempts at proofreading.  Portions of this note were generated with [employer]. Dictation errors may occur despite best attempts at proofreading.   Final Clinical Impression(s) / ED Diagnoses Final diagnoses:  Fall, initial encounter  Musculoskeletal pain    Rx / DC Orders ED Discharge Orders         Ordered    lidocaine (LIDODERM) 5 %  Every 24 hours        08/17/20 1850           Volanda Napoleon, PA-C 08/19/20 [id number]    Gareth Morgan, MD 08/26/20 0040

## 2020-08-17 NOTE — Discharge Instructions (Signed)
Your imaging today showed no evidence of new rib fractures, or fractures in your back.   Use lidocaine patches as directed.   Follow up with your primary care doctor for further evaluation.   Return to the Emergency Dept for any worsening pain, difficulty breathing, or any other worsening or concerning symptoms.

## 2020-09-23 ENCOUNTER — Other Ambulatory Visit: Payer: Self-pay

## 2020-09-23 ENCOUNTER — Emergency Department (HOSPITAL_BASED_OUTPATIENT_CLINIC_OR_DEPARTMENT_OTHER): Payer: Medicare Other

## 2020-09-23 ENCOUNTER — Emergency Department (HOSPITAL_BASED_OUTPATIENT_CLINIC_OR_DEPARTMENT_OTHER)
Admission: EM | Admit: 2020-09-23 | Discharge: 2020-09-23 | Disposition: A | Discharge status: Home / Self Care | Payer: Medicare Other | Attending: Emergency Medicine | Admitting: Emergency Medicine

## 2020-09-23 ENCOUNTER — Encounter (HOSPITAL_BASED_OUTPATIENT_CLINIC_OR_DEPARTMENT_OTHER): Payer: Self-pay

## 2020-09-23 DIAGNOSIS — I2510 Atherosclerotic heart disease of native coronary artery without angina pectoris: Secondary | ICD-10-CM | POA: Insufficient documentation

## 2020-09-23 DIAGNOSIS — R600 Localized edema: Secondary | ICD-10-CM

## 2020-09-23 DIAGNOSIS — I10 Essential (primary) hypertension: Secondary | ICD-10-CM | POA: Insufficient documentation

## 2020-09-23 DIAGNOSIS — G20 Parkinson's disease: Secondary | ICD-10-CM | POA: Diagnosis not present

## 2020-09-23 DIAGNOSIS — R609 Edema, unspecified: Secondary | ICD-10-CM | POA: Diagnosis not present

## 2020-09-23 DIAGNOSIS — R2243 Localized swelling, mass and lump, lower limb, bilateral: Secondary | ICD-10-CM | POA: Diagnosis present

## 2020-09-23 DIAGNOSIS — Z87891 Personal history of nicotine dependence: Secondary | ICD-10-CM | POA: Insufficient documentation

## 2020-09-23 LAB — CBC WITH DIFFERENTIAL/PLATELET
Abs Immature Granulocytes: 0.03 10*3/uL (ref 0.00–0.07)
Basophils Absolute: 0.1 10*3/uL (ref 0.0–0.1)
Basophils Relative: 1 %
Eosinophils Absolute: 0.1 10*3/uL (ref 0.0–0.5)
Eosinophils Relative: 2 %
HCT: 34.7 % — ABNORMAL LOW (ref 39.0–52.0)
Hemoglobin: 11.4 g/dL — ABNORMAL LOW (ref 13.0–17.0)
Immature Granulocytes: 0 %
Lymphocytes Relative: 17 %
Lymphs Abs: 1.1 10*3/uL (ref 0.7–4.0)
MCH: 31.8 pg (ref 26.0–34.0)
MCHC: 32.9 g/dL (ref 30.0–36.0)
MCV: 96.9 fL (ref 80.0–100.0)
Monocytes Absolute: 0.6 10*3/uL (ref 0.1–1.0)
Monocytes Relative: 8 %
Neutro Abs: 4.8 10*3/uL (ref 1.7–7.7)
Neutrophils Relative %: 72 %
Platelets: 165 10*3/uL (ref 150–400)
RBC: 3.58 MIL/uL — ABNORMAL LOW (ref 4.22–5.81)
RDW: 15.1 % (ref 11.5–15.5)
WBC: 6.7 10*3/uL (ref 4.0–10.5)
nRBC: 0 % (ref 0.0–0.2)

## 2020-09-23 LAB — BASIC METABOLIC PANEL
Anion gap: 7 (ref 5–15)
BUN: 18 mg/dL (ref 8–23)
CO2: 28 mmol/L (ref 22–32)
Calcium: 9.3 mg/dL (ref 8.9–10.3)
Chloride: 105 mmol/L (ref 98–111)
Creatinine, Ser: 0.87 mg/dL (ref 0.61–1.24)
GFR, Estimated: >60 mL/min (ref >60)
Glucose, Bld: 104 mg/dL — ABNORMAL HIGH (ref 70–99)
Potassium: 4 mmol/L (ref 3.5–5.1)
Sodium: 140 mmol/L (ref 135–145)

## 2020-09-23 LAB — BRAIN NATRIURETIC PEPTIDE: B Natriuretic Peptide: 79.5 pg/mL (ref 0.0–100.0)

## 2020-09-23 MED ORDER — FUROSEMIDE 20 MG PO TABS: 20.0000 mg | ORAL_TABLET | Freq: Every day | ORAL | 0 refills | Status: DC

## 2020-09-23 MED ORDER — POTASSIUM CHLORIDE ER 10 MEQ PO TBCR: 10.0000 meq | EXTENDED_RELEASE_TABLET | Freq: Every day | ORAL | 0 refills | Status: DC

## 2020-09-23 NOTE — Discharge Instructions (Addendum)
Call your primary care doctor or specialist as discussed in the next 2-3 days.   Return immediately back to the ER if:  Your symptoms worsen within the next 12-24 hours. You develop new symptoms such as new fevers, persistent vomiting, new pain, shortness of breath, or new weakness or numbness, or if you have any other concerns.  

## 2020-09-23 NOTE — ED Triage Notes (Signed)
Pt presents with bilateral leg swelling x 2 days. Denies CP, ShOB, or orthopnea. Pt reports no relief with elevation.

## 2020-09-23 NOTE — ED Provider Notes (Signed)
[facility]ENC[facility] DEPARTMENT Provider Note   CSN: [id number] Arrival date & time: 09/23/20  1432     History Chief Complaint  Patient presents with  . Leg Swelling    Aaron Schmidt is a 86 y.o. male.  Patient presents with bilateral leg swelling ongoing for 3 days.  He is tried lifting the leg up without improvement.  Denies any chest pain or fevers.  No shortness of breath no vomiting or cough no diarrhea.  Denies prior episodes of swelling in the past.        Past Medical History:  Diagnosis Date  . Arthritis    Degeneration spine & stenosis  . History of kidney stones   . Hypertension    has a histroy of  . Hypotension   . Myocardial infarction ([place]) 03/21/2019  . Parkinson's disease ([place])   . Sleep apnea 2012   used CPAP 2 yrs. ago, feels he sleeps better w/o, no longer using     Patient Active Problem List   Diagnosis Date Noted  . Degenerative lumbar spinal stenosis 09/22/2019  . Hypotension 06/23/2019  . Myocarditis ([place]) 05/19/2019  . CAD (coronary artery disease) 04/20/2019  . Obesity 04/06/2019  . Malnutrition of moderate degree 03/23/2019  . NSTEMI (non-ST elevated myocardial infarction) ([place]) 03/21/2019  . Back pain 03/21/2019  . Anemia 09/21/2017  . Symptomatic anemia 09/20/2017  . Protein-calorie malnutrition, severe 09/11/2017  . Empyema lung ([place]) 09/09/2017  . SIRS (systemic inflammatory response syndrome) ([place]) 09/05/2017  . Elevated troponin 09/05/2017  . Normochromic normocytic anemia 09/05/2017  . Hyponatremia 09/05/2017  . Acute kidney injury superimposed on chronic kidney disease ([place]) 09/05/2017  . Chronic pain 09/05/2017  . Pleural effusion, left 09/04/2017  . Pleural effusion on left 08/19/2017  . Sinusitis, chronic 08/19/2017  . Murmur 01/08/2017  . Skin ulcer of toe of left foot, limited to breakdown of skin ([place]) 09/04/2016  . Cervical myelopathy with cervical radiculopathy ([place]) 06/24/2014  . LBBB (left  bundle branch block) 06/21/2014  . Hyperlipidemia 06/21/2014  . Preoperative cardiovascular examination 06/21/2014  . OBSTRUCTIVE SLEEP APNEA 09/30/2010  . Essential hypertension 08/29/2010    Past Surgical History:  Procedure Laterality Date  . ANTERIOR LAT LUMBAR FUSION N/A 09/22/2019   Procedure: Lumbar one-two, Lumbar two-three Anterolateral lumbar interbody fusion with pedicle screw fixation and exploration of adjacent fusion;  Surgeon: Erline Levine, MD;  Location: [facility];  Service: Neurosurgery;  Laterality: N/A;  . ARM NEUROPLASTY     at 14 yrs. of age- fell off tractor- had fracture & repair *& later- 66's had  transplantation of a nerve at the elbow  . BACK SURGERY     x4 back surgery x2 fusion -  . CARDIAC CATHETERIZATION  03/23/2019  . CARPAL TUNNEL RELEASE Right   . COLONOSCOPY    . ESOPHAGOGASTRODUODENOSCOPY (EGD) WITH PROPOFOL N/A 09/23/2017   Procedure: ESOPHAGOGASTRODUODENOSCOPY (EGD) WITH PROPOFOL;  Surgeon: Laurence Spates, MD;  Location: [facility];  Service: Endoscopy;  Laterality: N/A;  . LEFT HEART CATH AND CORONARY ANGIOGRAPHY N/A 03/23/2019   Procedure: LEFT HEART CATH AND CORONARY ANGIOGRAPHY;  Surgeon: Burnell Blanks, MD;  Location: [facility];  Service: Cardiovascular;  Laterality: N/A;  . LUMBAR PERCUTANEOUS PEDICLE SCREW 2 LEVEL N/A 09/22/2019   Procedure: LUMBAR PERCUTANEOUS PEDICLE SCREW TWO LEVEL;  Surgeon: Erline Levine, MD;  Location: [facility];  Service: Neurosurgery;  Laterality: N/A;  . PLEURAL EFFUSION DRAINAGE Left 09/09/2017   Procedure: DRAINAGE OF PLEURAL EFFUSION;  Surgeon: [name], MD;  Location: [facility];  Service: Thoracic;  Laterality: Left;  . POSTERIOR CERVICAL FUSION/FORAMINOTOMY N/A 06/24/2014   Procedure: Posterior Cervical Three-Seven Fusion with Lateral Mass Fixation;  Surgeon: Erline Levine, MD;  Location: [facility];  Service: Neurosurgery;  Laterality: N/A;  C3-C7 posterior cervical fusion with lateral mass  fixation  . SHOULDER SURGERY Bilateral   . VIDEO ASSISTED THORACOSCOPY (VATS)/DECORTICATION Left 09/09/2017   Procedure: VIDEO ASSISTED THORACOSCOPY (VATS)/DECORTICATION;  Surgeon: [name], MD;  Location: [place] [facility];  Service: Thoracic;  Laterality: Left;  Marland Kitchen VIDEO BRONCHOSCOPY  09/09/2017   Procedure: VIDEO BRONCHOSCOPY;  Surgeon: [name], MD;  Location: [facility];  Service: Thoracic;;       Family History  Problem Relation Age of Onset  . Pancreatic cancer Mother   . Leukemia Son     Social History   Tobacco Use  . Smoking status: Former Smoker    Years: 24.00    Types: Pipe    Quit date: 09/24/1978    Years since quitting: 42.0  . Smokeless tobacco: Never Used  Vaping Use  . Vaping Use: Never used  Substance Use Topics  . Alcohol use: Yes    Comment: 3 drinks/day - gin or vodka   . Drug use: No    Home Medications Prior to Admission medications   Medication Sig Start Date End Date Taking? Authorizing Provider  furosemide (LASIX) 20 MG tablet Take 1 tablet (20 mg total) by mouth daily for 5 days. 09/23/20 09/28/20 Yes Luna Fuse, MD  potassium chloride (KLOR-CON) 10 MEQ tablet Take 1 tablet (10 mEq total) by mouth daily for 5 days. 09/23/20 09/28/20 Yes Luna Fuse, MD  aspirin EC 81 MG tablet Take 81 mg by mouth daily.    [provider]  carbidopa-levodopa (SINEMET IR) 25-100 MG tablet Take 1 tablet by mouth 3 (three) times daily. 7am/11am/4pm 08/10/20   Tat, Eustace Quail, DO  Cholecalciferol (VITAMIN D) 2000 UNITS CAPS Take 2,000 Units by mouth daily.     [provider]  docusate sodium (COLACE) 100 MG capsule Take 600 mg by mouth daily.    [provider]  finasteride (PROSCAR) 5 MG tablet Take 5 mg by mouth daily.    [provider]  fluocinonide cream (LIDEX) [id number] % Apply 1 application topically daily as needed (itching).    [provider]  HYDROmorphone (DILAUDID) 2 MG tablet Take 2 tablets (4 mg  total) by mouth 4 (four) times daily. Patient taking differently: Take 4 mg by mouth. Take 1 tab every 5 hours as needed for pain do not exceed more than 5 per day 09/24/19   Erline Levine, MD  L-Lysine 500 MG CAPS Take 500 mg by mouth daily.     [provider]  lidocaine (LIDODERM) 5 % Place 1 patch onto the skin daily. Remove & Discard patch within 12 hours or as directed by MD 08/17/20   Volanda Napoleon, PA-C  losartan (COZAAR) 25 MG tablet Take 1 tablet (25 mg total) by mouth daily. Please make yearly appt with Dr. [name] for November for future refills. 1st attempt 06/06/20   Dorothy Spark, MD  mineral oil liquid Take 108 mLs by mouth daily in the afternoon.     [provider]  Multiple Vitamin (MULTIVITAMIN WITH MINERALS) TABS tablet Take 1 tablet by mouth daily. Mature mens 50+    [provider]  nortriptyline (PAMELOR) 25 MG capsule Take 25  mg by mouth at bedtime.     [provider]  OVER THE COUNTER MEDICATION Take 3 tablets by mouth daily. Perdiem 15 mg     [provider]  polyethylene glycol (MIRALAX / GLYCOLAX) packet Take 17 g by mouth daily. 10/13/17   Pricilla Loveless, MD  pregabalin (LYRICA) 150 MG capsule Take 150 mg by mouth daily.     [provider]  pyridOXINE (VITAMIN B-6) 100 MG tablet Take 100 mg by mouth daily.    [provider]  simvastatin (ZOCOR) 40 MG tablet Take 40 mg by mouth at bedtime.     [provider]  tiZANidine (ZANAFLEX) 2 MG tablet Take 4 mg by mouth 3 (three) times daily.     [provider]  vitamin B-12 (CYANOCOBALAMIN) 1000 MCG tablet Take 1,000 mcg by mouth daily.     [provider]  vitamin C (ASCORBIC ACID) 500 MG tablet Take 500 mg by mouth daily.    [provider]    Allergies    Baclofen, Doxazosin mesylate, and Diazepam  Review of Systems   Review of Systems  Constitutional: Negative for fever.  HENT: Negative for ear pain and sore  throat.   Eyes: Negative for pain.  Respiratory: Negative for cough.   Cardiovascular: Negative for chest pain.  Gastrointestinal: Negative for abdominal pain.  Genitourinary: Negative for flank pain.  Musculoskeletal: Negative for back pain.  Skin: Negative for color change and rash.  Neurological: Negative for syncope.  All other systems reviewed and are negative.   Physical Exam Updated Vital Signs BP (!) 149/82   Pulse 85   Temp 99 F (37.2 C) (Oral)   Resp 16   Ht 5\' 10"  (1.778 m)   Wt 70.3 kg   SpO2 100%   BMI 22.24 kg/m   Physical Exam Constitutional:      General: He is not in acute distress.    Appearance: He is well-developed.  HENT:     Head: Normocephalic.     Nose: Nose normal.  Eyes:     Extraocular Movements: Extraocular movements intact.  Cardiovascular:     Rate and Rhythm: Normal rate.  Pulmonary:     Effort: Pulmonary effort is normal.  Musculoskeletal:     Right lower leg: Edema present.     Left lower leg: Edema present.  Skin:    Coloration: Skin is not jaundiced.  Neurological:     Mental Status: He is alert. Mental status is at baseline.     ED Results / Procedures / Treatments   Labs (all labs ordered are listed, but only abnormal results are displayed) Labs Reviewed  BASIC METABOLIC PANEL - Abnormal; Notable for the following components:      Result Value   Glucose, Bld 104 (*)    All other components within normal limits  CBC WITH DIFFERENTIAL/PLATELET - Abnormal; Notable for the following components:   RBC 3.58 (*)    Hemoglobin 11.4 (*)    HCT 34.7 (*)    All other components within normal limits  BRAIN NATRIURETIC PEPTIDE    EKG None  Radiology Venous Img Lower Bilateral  Result Date: 09/23/2020 CLINICAL DATA:  Bilateral lower extremity swelling for years, worse this week. Left foot pain for 1 week. EXAM: Bilateral LOWER EXTREMITY VENOUS DOPPLER ULTRASOUND TECHNIQUE: Gray-scale sonography with compression, as well  as color and duplex ultrasound, were performed to evaluate the deep venous system(s) from the level of the common femoral vein  through the popliteal and proximal calf veins. COMPARISON:  None. FINDINGS: VENOUS Normal compressibility of both common femoral, superficial femoral, and popliteal veins, as well as the visualized calf veins. Visualized portions of profunda femoral vein and great saphenous vein unremarkable. No filling defects to suggest DVT on grayscale or color Doppler imaging. Doppler waveforms show normal direction of venous flow, normal respiratory plasticity and response to augmentation. OTHER Soft tissue edema bilaterally. Limitations: none IMPRESSION: No evidence of significant deep venous thrombosis in the visualized lower extremities. Electronically Signed   By: Lucienne Capers M.D.   On: 09/23/2020 20:08    Procedures Procedures (including critical care time)  Medications Ordered in ED Medications - No data to display  ED Course  I have reviewed the triage vital signs and the nursing notes.  Pertinent labs & imaging results that were available during my care of the patient were reviewed by me and considered in my medical decision making (see chart for details).    MDM Rules/Calculators/A&P                          Sound shows no evidence of DVT.  Labs otherwise unremarkable proBNP normal range.  Patient had bilateral lower extremity legs placed in Ace wrap's for compression.  Given a prescription of Lasix to trial at home.  Advised follow-up with his doctors in 2 or 3 days.  Advised return if he has chest pain shortness of breath or any additional concerns.   Final Clinical Impression(s) / ED Diagnoses Final diagnoses:  Peripheral edema    Rx / DC Orders ED Discharge Orders         Ordered    furosemide (LASIX) 20 MG tablet  Daily        09/23/20 2130    potassium chloride (KLOR-CON) 10 MEQ tablet  Daily        09/23/20 2130           Luna Fuse,  MD 09/23/20 2130

## 2020-09-23 NOTE — ED Notes (Signed)
Discharge instructions discussed with patient. Bilateral ACE wraps applied. Pt departs ED at this time in stable condition.

## 2020-09-28 DIAGNOSIS — G8929 Other chronic pain: Secondary | ICD-10-CM | POA: Diagnosis not present

## 2020-09-28 DIAGNOSIS — M25512 Pain in left shoulder: Secondary | ICD-10-CM | POA: Diagnosis not present

## 2020-09-28 DIAGNOSIS — I10 Essential (primary) hypertension: Secondary | ICD-10-CM | POA: Diagnosis not present

## 2020-09-28 DIAGNOSIS — R609 Edema, unspecified: Secondary | ICD-10-CM | POA: Diagnosis not present

## 2020-10-04 DIAGNOSIS — M25512 Pain in left shoulder: Secondary | ICD-10-CM | POA: Diagnosis not present

## 2020-10-04 DIAGNOSIS — G8929 Other chronic pain: Secondary | ICD-10-CM | POA: Diagnosis not present

## 2020-10-05 DIAGNOSIS — M7989 Other specified soft tissue disorders: Secondary | ICD-10-CM | POA: Diagnosis not present

## 2020-10-06 DIAGNOSIS — L2089 Other atopic dermatitis: Secondary | ICD-10-CM | POA: Diagnosis not present

## 2020-10-07 ENCOUNTER — Other Ambulatory Visit: Payer: Self-pay | Admitting: Cardiology

## 2020-10-19 DIAGNOSIS — I10 Essential (primary) hypertension: Secondary | ICD-10-CM | POA: Diagnosis not present

## 2020-10-19 DIAGNOSIS — I252 Old myocardial infarction: Secondary | ICD-10-CM | POA: Diagnosis not present

## 2020-10-19 DIAGNOSIS — M8580 Other specified disorders of bone density and structure, unspecified site: Secondary | ICD-10-CM | POA: Diagnosis not present

## 2020-10-19 DIAGNOSIS — E7800 Pure hypercholesterolemia, unspecified: Secondary | ICD-10-CM | POA: Diagnosis not present

## 2020-10-19 DIAGNOSIS — G8929 Other chronic pain: Secondary | ICD-10-CM | POA: Diagnosis not present

## 2020-10-19 DIAGNOSIS — D500 Iron deficiency anemia secondary to blood loss (chronic): Secondary | ICD-10-CM | POA: Diagnosis not present

## 2020-10-19 DIAGNOSIS — D509 Iron deficiency anemia, unspecified: Secondary | ICD-10-CM | POA: Diagnosis not present

## 2020-10-19 DIAGNOSIS — N183 Chronic kidney disease, stage 3 unspecified: Secondary | ICD-10-CM | POA: Diagnosis not present

## 2020-10-19 DIAGNOSIS — M1990 Unspecified osteoarthritis, unspecified site: Secondary | ICD-10-CM | POA: Diagnosis not present

## 2020-10-20 DIAGNOSIS — M48062 Spinal stenosis, lumbar region with neurogenic claudication: Secondary | ICD-10-CM | POA: Diagnosis not present

## 2020-10-20 DIAGNOSIS — G8929 Other chronic pain: Secondary | ICD-10-CM | POA: Diagnosis not present

## 2020-10-20 DIAGNOSIS — M542 Cervicalgia: Secondary | ICD-10-CM | POA: Diagnosis not present

## 2020-11-11 DIAGNOSIS — E7800 Pure hypercholesterolemia, unspecified: Secondary | ICD-10-CM | POA: Diagnosis not present

## 2020-11-11 DIAGNOSIS — D500 Iron deficiency anemia secondary to blood loss (chronic): Secondary | ICD-10-CM | POA: Diagnosis not present

## 2020-11-11 DIAGNOSIS — M8580 Other specified disorders of bone density and structure, unspecified site: Secondary | ICD-10-CM | POA: Diagnosis not present

## 2020-11-11 DIAGNOSIS — M1990 Unspecified osteoarthritis, unspecified site: Secondary | ICD-10-CM | POA: Diagnosis not present

## 2020-11-11 DIAGNOSIS — G8929 Other chronic pain: Secondary | ICD-10-CM | POA: Diagnosis not present

## 2020-11-11 DIAGNOSIS — I10 Essential (primary) hypertension: Secondary | ICD-10-CM | POA: Diagnosis not present

## 2020-11-11 DIAGNOSIS — D509 Iron deficiency anemia, unspecified: Secondary | ICD-10-CM | POA: Diagnosis not present

## 2020-11-11 DIAGNOSIS — G20 Parkinson's disease: Secondary | ICD-10-CM | POA: Diagnosis not present

## 2020-11-11 DIAGNOSIS — N183 Chronic kidney disease, stage 3 unspecified: Secondary | ICD-10-CM | POA: Diagnosis not present

## 2020-11-21 DIAGNOSIS — G20 Parkinson's disease: Secondary | ICD-10-CM | POA: Diagnosis not present

## 2020-11-21 DIAGNOSIS — D509 Iron deficiency anemia, unspecified: Secondary | ICD-10-CM | POA: Diagnosis not present

## 2020-11-21 DIAGNOSIS — N183 Chronic kidney disease, stage 3 unspecified: Secondary | ICD-10-CM | POA: Diagnosis not present

## 2020-11-21 DIAGNOSIS — D500 Iron deficiency anemia secondary to blood loss (chronic): Secondary | ICD-10-CM | POA: Diagnosis not present

## 2020-11-21 DIAGNOSIS — M8580 Other specified disorders of bone density and structure, unspecified site: Secondary | ICD-10-CM | POA: Diagnosis not present

## 2020-11-21 DIAGNOSIS — M1990 Unspecified osteoarthritis, unspecified site: Secondary | ICD-10-CM | POA: Diagnosis not present

## 2020-11-21 DIAGNOSIS — I10 Essential (primary) hypertension: Secondary | ICD-10-CM | POA: Diagnosis not present

## 2020-11-21 DIAGNOSIS — G8929 Other chronic pain: Secondary | ICD-10-CM | POA: Diagnosis not present

## 2020-11-21 DIAGNOSIS — E7800 Pure hypercholesterolemia, unspecified: Secondary | ICD-10-CM | POA: Diagnosis not present

## 2020-12-22 DIAGNOSIS — M1990 Unspecified osteoarthritis, unspecified site: Secondary | ICD-10-CM | POA: Diagnosis not present

## 2020-12-22 DIAGNOSIS — G8929 Other chronic pain: Secondary | ICD-10-CM | POA: Diagnosis not present

## 2020-12-22 DIAGNOSIS — D500 Iron deficiency anemia secondary to blood loss (chronic): Secondary | ICD-10-CM | POA: Diagnosis not present

## 2020-12-22 DIAGNOSIS — G20 Parkinson's disease: Secondary | ICD-10-CM | POA: Diagnosis not present

## 2020-12-22 DIAGNOSIS — E7800 Pure hypercholesterolemia, unspecified: Secondary | ICD-10-CM | POA: Diagnosis not present

## 2020-12-22 DIAGNOSIS — N183 Chronic kidney disease, stage 3 unspecified: Secondary | ICD-10-CM | POA: Diagnosis not present

## 2020-12-22 DIAGNOSIS — I10 Essential (primary) hypertension: Secondary | ICD-10-CM | POA: Diagnosis not present

## 2020-12-22 DIAGNOSIS — M8580 Other specified disorders of bone density and structure, unspecified site: Secondary | ICD-10-CM | POA: Diagnosis not present

## 2021-01-10 ENCOUNTER — Other Ambulatory Visit: Payer: Self-pay

## 2021-01-10 DIAGNOSIS — D500 Iron deficiency anemia secondary to blood loss (chronic): Secondary | ICD-10-CM | POA: Insufficient documentation

## 2021-01-10 DIAGNOSIS — N2889 Other specified disorders of kidney and ureter: Secondary | ICD-10-CM | POA: Insufficient documentation

## 2021-01-10 DIAGNOSIS — D696 Thrombocytopenia, unspecified: Secondary | ICD-10-CM

## 2021-01-10 DIAGNOSIS — R600 Localized edema: Secondary | ICD-10-CM | POA: Insufficient documentation

## 2021-01-10 DIAGNOSIS — N400 Enlarged prostate without lower urinary tract symptoms: Secondary | ICD-10-CM

## 2021-01-10 DIAGNOSIS — Z860100 Personal history of colon polyps, unspecified: Secondary | ICD-10-CM | POA: Insufficient documentation

## 2021-01-10 DIAGNOSIS — N200 Calculus of kidney: Secondary | ICD-10-CM | POA: Insufficient documentation

## 2021-01-10 DIAGNOSIS — G20 Parkinson's disease: Secondary | ICD-10-CM | POA: Insufficient documentation

## 2021-01-10 DIAGNOSIS — N401 Enlarged prostate with lower urinary tract symptoms: Secondary | ICD-10-CM

## 2021-01-10 DIAGNOSIS — I252 Old myocardial infarction: Secondary | ICD-10-CM

## 2021-01-10 DIAGNOSIS — Z87442 Personal history of urinary calculi: Secondary | ICD-10-CM | POA: Insufficient documentation

## 2021-01-10 DIAGNOSIS — R899 Unspecified abnormal finding in specimens from other organs, systems and tissues: Secondary | ICD-10-CM

## 2021-01-10 DIAGNOSIS — M8580 Other specified disorders of bone density and structure, unspecified site: Secondary | ICD-10-CM

## 2021-01-10 DIAGNOSIS — I059 Rheumatic mitral valve disease, unspecified: Secondary | ICD-10-CM

## 2021-01-10 DIAGNOSIS — Z8709 Personal history of other diseases of the respiratory system: Secondary | ICD-10-CM

## 2021-01-10 DIAGNOSIS — D126 Benign neoplasm of colon, unspecified: Secondary | ICD-10-CM

## 2021-01-10 DIAGNOSIS — R251 Tremor, unspecified: Secondary | ICD-10-CM

## 2021-01-10 DIAGNOSIS — E8809 Other disorders of plasma-protein metabolism, not elsewhere classified: Secondary | ICD-10-CM

## 2021-01-10 DIAGNOSIS — R609 Edema, unspecified: Secondary | ICD-10-CM | POA: Insufficient documentation

## 2021-01-10 DIAGNOSIS — E7800 Pure hypercholesterolemia, unspecified: Secondary | ICD-10-CM

## 2021-01-10 DIAGNOSIS — M1990 Unspecified osteoarthritis, unspecified site: Secondary | ICD-10-CM

## 2021-01-10 DIAGNOSIS — R7301 Impaired fasting glucose: Secondary | ICD-10-CM | POA: Insufficient documentation

## 2021-01-10 DIAGNOSIS — N183 Chronic kidney disease, stage 3 unspecified: Secondary | ICD-10-CM | POA: Insufficient documentation

## 2021-01-10 DIAGNOSIS — M5430 Sciatica, unspecified side: Secondary | ICD-10-CM

## 2021-01-10 DIAGNOSIS — D509 Iron deficiency anemia, unspecified: Secondary | ICD-10-CM

## 2021-01-10 DIAGNOSIS — I700 Atherosclerosis of aorta: Secondary | ICD-10-CM

## 2021-01-10 DIAGNOSIS — I10 Essential (primary) hypertension: Secondary | ICD-10-CM | POA: Insufficient documentation

## 2021-01-10 DIAGNOSIS — M792 Neuralgia and neuritis, unspecified: Secondary | ICD-10-CM | POA: Insufficient documentation

## 2021-01-10 HISTORY — DX: Pure hypercholesterolemia, unspecified: E78.00

## 2021-01-10 HISTORY — DX: Atherosclerosis of aorta: I70.0

## 2021-01-10 HISTORY — DX: Benign neoplasm of colon, unspecified: D12.6

## 2021-01-10 HISTORY — DX: Tremor, unspecified: R25.1

## 2021-01-10 HISTORY — DX: Personal history of colonic polyps: Z86.010

## 2021-01-10 HISTORY — DX: Unspecified osteoarthritis, unspecified site: M19.90

## 2021-01-10 HISTORY — DX: Iron deficiency anemia, unspecified: D50.9

## 2021-01-10 HISTORY — DX: Benign prostatic hyperplasia without lower urinary tract symptoms: N40.0

## 2021-01-10 HISTORY — DX: Calculus of kidney: N20.0

## 2021-01-10 HISTORY — DX: Impaired fasting glucose: R73.01

## 2021-01-10 HISTORY — DX: Other disorders of plasma-protein metabolism, not elsewhere classified: E88.09

## 2021-01-10 HISTORY — DX: Sciatica, unspecified side: M54.30

## 2021-01-10 HISTORY — DX: Old myocardial infarction: I25.2

## 2021-01-10 HISTORY — DX: Neuralgia and neuritis, unspecified: M79.2

## 2021-01-10 HISTORY — DX: Personal history of colon polyps, unspecified: Z86.0100

## 2021-01-10 HISTORY — DX: Benign prostatic hyperplasia with lower urinary tract symptoms: N40.1

## 2021-01-10 HISTORY — DX: Other specified disorders of kidney and ureter: N28.89

## 2021-01-10 HISTORY — DX: Personal history of other diseases of the respiratory system: Z87.09

## 2021-01-10 HISTORY — DX: Edema, unspecified: R60.9

## 2021-01-10 HISTORY — DX: Rheumatic mitral valve disease, unspecified: I05.9

## 2021-01-10 HISTORY — DX: Thrombocytopenia, unspecified: D69.6

## 2021-01-10 HISTORY — DX: Other specified disorders of bone density and structure, unspecified site: M85.80

## 2021-01-10 HISTORY — DX: Chronic kidney disease, stage 3 unspecified: N18.30

## 2021-01-10 HISTORY — DX: Unspecified abnormal finding in specimens from other organs, systems and tissues: R89.9

## 2021-01-10 HISTORY — DX: Iron deficiency anemia secondary to blood loss (chronic): D50.0

## 2021-01-12 ENCOUNTER — Ambulatory Visit: Payer: Medicare Other | Admitting: Cardiology

## 2021-01-12 ENCOUNTER — Other Ambulatory Visit: Payer: Self-pay

## 2021-01-12 ENCOUNTER — Encounter: Payer: Self-pay | Admitting: Cardiology

## 2021-01-12 VITALS — BP 112/60 | HR 72 | Ht 70.0 in | Wt 151.1 lb

## 2021-01-12 DIAGNOSIS — I447 Left bundle-branch block, unspecified: Secondary | ICD-10-CM

## 2021-01-12 DIAGNOSIS — E782 Mixed hyperlipidemia: Secondary | ICD-10-CM | POA: Diagnosis not present

## 2021-01-12 DIAGNOSIS — I10 Essential (primary) hypertension: Secondary | ICD-10-CM | POA: Diagnosis not present

## 2021-01-12 DIAGNOSIS — I340 Nonrheumatic mitral (valve) insufficiency: Secondary | ICD-10-CM | POA: Diagnosis not present

## 2021-01-12 MED ORDER — LOSARTAN POTASSIUM 25 MG PO TABS: 25.0000 mg | ORAL_TABLET | Freq: Every day | ORAL | 3 refills | Status: DC

## 2021-01-12 NOTE — Progress Notes (Signed)
Cardiology Office Note:    Date:  01/12/2021   ID:  Aaron Schmidt, DOB 06/26/1933, MRN [id number]  PCP:  Hulan Fess, MD  Cardiologist:  Berniece Salines, DO  Electrophysiologist:  None   Referring MD: Hulan Fess, MD   I am doing fine   History of Present Illness:    Aaron Schmidt is a 87 y.o. male with a hx of history of hypertension, HLD, chronic LBBB, OSA on CPAP. chronic left bundle branch block. Cardiac catheterization 03/23/2019 no evidence of CAD, moderate mitral regurgitation on TTE done 05/2019.   The patient previously followed with Dr. Meda Schmidt. He is here for a follow up visit.  Patient with no complaints at this time.  He recently moved to an assisted living facility and he is happy with his decision.  Past Medical History:  Diagnosis Date  . Acute kidney injury superimposed on chronic kidney disease ([place]) 09/05/2017  . Anemia 09/21/2017  . Anemia due to chronic blood loss 01/10/2021  . Arthritis    Degeneration spine & stenosis  . Benign prostatic hyperplasia 01/10/2021  . Benign prostatic hyperplasia with lower urinary tract symptoms 01/10/2021  . Body mass index (BMI) 25.0-25.9, adult 08/31/2019  . CAD (coronary artery disease) 04/20/2019  . Cervical myelopathy with cervical radiculopathy ([place]) 06/24/2014  . Cervical spondylosis without myelopathy 05/12/2014  . Chronic bilateral low back pain 07/26/2020  . Chronic kidney disease, stage 3 unspecified ([place]) 01/10/2021  . Chronic low back pain 07/29/2013  . Chronic pain 09/05/2017  . Degenerative cervical spinal stenosis 05/26/2014  . Degenerative lumbar spinal stenosis 09/22/2019  . Displacement of cervical intervertebral disc 07/19/2014  . Edema 01/10/2021  . Elevated troponin 09/05/2017  . Empyema lung ([place]) 09/09/2017  . Enlarged prostate 01/10/2021  . Essential hypertension 08/29/2010   Qualifier: Diagnosis of  By: Elsworth Soho MD, Leanna Sato Fall in home 07/10/2017  . Hardening of the aorta (main artery of the heart)  ([place]) 01/10/2021  . History of kidney stones   . Hyperlipidemia 06/21/2014  . Hypertension    has a histroy of  . Hypoalbuminemia 01/10/2021  . Hyponatremia 09/05/2017  . Hypotension   . Impaired fasting glucose 01/10/2021  . Intermittent tremor 01/10/2021  . Iron deficiency anemia 01/10/2021  . Kidney stone 01/10/2021  . LBBB (left bundle branch block) 06/21/2014  . Lumbar post-laminectomy syndrome 07/15/2013  . Lumbar radiculopathy 07/29/2013  . Lumbar stenosis with neurogenic claudication 08/31/2019  . Malnutrition of moderate degree 03/23/2019  . Mitral valve disorder 01/10/2021  . Murmur 01/08/2017  . Muscle pain 03/01/2020  . Myocardial infarction ([place]) 03/21/2019  . Neck pain 03/03/2014  . Normochromic normocytic anemia 09/05/2017  . NSTEMI (non-ST elevated myocardial infarction) ([place]) 03/21/2019  . Obesity 04/06/2019  . Obstructive sleep apnea 09/30/2010   PSG (180 lbs ) >> severe obstructive sleep apnea with AHi 38/h & desatns to 82%  Started on auto 5-15  >> changed to 8 cm Download reviewed 1/21 - 11/23/10 >> residual events of 6/h , avg pr 8 cm, leak + with nasal pillows    . Occasional tremors 07/26/2020  . Old myocardial infarction 01/10/2021  . Osteoarthrosis 01/10/2021  . Osteopenia 01/10/2021  . Other specified disorders of kidney and ureter 01/10/2021  . Parkinson's disease ([place])   . Peripheral edema 01/10/2021  . Peripheral neuralgia 01/10/2021  . Personal history of colonic polyps 01/10/2021  . Personal history of other diseases of the respiratory system 01/10/2021  . Pleural effusion  on left 08/19/2017   Acute symptoms since 08/13/17 > CTa  08/13/17  neg pe/ extensively loculated effusion - L thoracentesis 08/20/2017 :  220 cc with glucose < 20 and WBC 5,338 mostly Poly, LDH 688 > cyt neg >Referrd to T surgery > VATS 09/09/17 c/w empyema  . Pleural effusion, left 09/04/2017  . Preoperative cardiovascular examination 06/21/2014  . Protein-calorie malnutrition, severe 09/11/2017  . Pure  hypercholesterolemia 01/10/2021  . Sciatica 01/10/2021  . Scoliosis concern 08/31/2019  . Sensorineural hearing loss (SNHL) of both ears 06/15/2020   Last Assessment & Plan:  Formatting of this note might be different from the original. Concern over worsening hearing. Chronic history of sensorineural hearing loss bilaterally.  Has hearing aids currently.  He feels his hearing aids are not working as well as they historically have.  Recently had them checked. EXAM shows normal external canals and tympanic membranes bilaterally. AUDIOGRAM Shows B  . Shoulder joint pain 12/01/2013  . Sinusitis, chronic 08/19/2017   CT sinus  08/20/2017 >>> Normally aerated paranasal sinuses. Patent sinus drainage pathways.  Marland Kitchen SIRS (systemic inflammatory response syndrome) ([place]) 09/05/2017  . Skin ulcer of toe of left foot, limited to breakdown of skin ([place]) 09/04/2016  . Sleep apnea 2012   used CPAP 2 yrs. ago, feels he sleeps better w/o, no longer using   . Spinal stenosis of lumbar region 08/10/2013  . Spinal stenosis, lumbar region without neurogenic claudication 08/19/2019  . Symptomatic anemia 09/20/2017  . Thrombocytopenia ([place]) 01/10/2021  . Tubular adenoma of colon 01/10/2021  . Unspecified abnormal finding in specimens from other organs, systems and tissues 01/10/2021    Past Surgical History:  Procedure Laterality Date  . ANTERIOR LAT LUMBAR FUSION N/A 09/22/2019   Procedure: Lumbar one-two, Lumbar two-three Anterolateral lumbar interbody fusion with pedicle screw fixation and exploration of adjacent fusion;  Surgeon: Erline Levine, MD;  Location: [facility];  Service: Neurosurgery;  Laterality: N/A;  . ARM NEUROPLASTY     at 14 yrs. of age- fell off tractor- had fracture & repair *& later- 5's had  transplantation of a nerve at the elbow  . BACK SURGERY     x4 back surgery x2 fusion -  . CARDIAC CATHETERIZATION  03/23/2019  . CARPAL TUNNEL RELEASE Right   . COLONOSCOPY    . ESOPHAGOGASTRODUODENOSCOPY  (EGD) WITH PROPOFOL N/A 09/23/2017   Procedure: ESOPHAGOGASTRODUODENOSCOPY (EGD) WITH PROPOFOL;  Surgeon: Laurence Spates, MD;  Location: [facility];  Service: Endoscopy;  Laterality: N/A;  . LEFT HEART CATH AND CORONARY ANGIOGRAPHY N/A 03/23/2019   Procedure: LEFT HEART CATH AND CORONARY ANGIOGRAPHY;  Surgeon: Burnell Blanks, MD;  Location: [facility];  Service: Cardiovascular;  Laterality: N/A;  . LUMBAR PERCUTANEOUS PEDICLE SCREW 2 LEVEL N/A 09/22/2019   Procedure: LUMBAR PERCUTANEOUS PEDICLE SCREW TWO LEVEL;  Surgeon: Erline Levine, MD;  Location: [facility];  Service: Neurosurgery;  Laterality: N/A;  . PLEURAL EFFUSION DRAINAGE Left 09/09/2017   Procedure: DRAINAGE OF PLEURAL EFFUSION;  Surgeon: [name], MD;  Location: [facility];  Service: Thoracic;  Laterality: Left;  . POSTERIOR CERVICAL FUSION/FORAMINOTOMY N/A 06/24/2014   Procedure: Posterior Cervical Three-Seven Fusion with Lateral Mass Fixation;  Surgeon: Erline Levine, MD;  Location: [facility];  Service: Neurosurgery;  Laterality: N/A;  C3-C7 posterior cervical fusion with lateral mass fixation  . SHOULDER SURGERY Bilateral   . VIDEO ASSISTED THORACOSCOPY (VATS)/DECORTICATION Left 09/09/2017   Procedure: VIDEO ASSISTED THORACOSCOPY (VATS)/DECORTICATION;  Surgeon: [name], MD;  Location:  [facility];  Service: Thoracic;  Laterality: Left;  Marland Kitchen VIDEO BRONCHOSCOPY  09/09/2017   Procedure: VIDEO BRONCHOSCOPY;  Surgeon: [name], MD;  Location: [facility];  Service: Thoracic;;    Current Medications: Current Meds  Medication Sig  . aspirin EC 81 MG tablet Take 81 mg by mouth daily.  . carbidopa-levodopa (SINEMET IR) 25-100 MG tablet Take 1 tablet by mouth 3 (three) times daily. 7am/11am/4pm  . Cholecalciferol (VITAMIN D) 2000 UNITS CAPS Take 2,000 Units by mouth daily.  . diclofenac (VOLTAREN) 75 MG EC tablet Take 75 mg by mouth 2 (two) times daily.  Marland Kitchen docusate sodium (COLACE) 100 MG capsule Take  600 mg by mouth daily.  . finasteride (PROSCAR) 5 MG tablet Take 5 mg by mouth daily.  . fluocinonide cream (LIDEX) 9.56 % Apply 1 application topically daily as needed (itching).  . furosemide (LASIX) 40 MG tablet Take 40 mg by mouth daily.  Marland Kitchen HYDROmorphone (DILAUDID) 2 MG tablet Take 2 tablets (4 mg total) by mouth 4 (four) times daily. (Patient taking differently: Take 4 mg by mouth. Take 1 tab every 5 hours as needed for pain do not exceed more than 5 per day)  . L-Lysine 500 MG CAPS Take 500 mg by mouth daily.  . mineral oil liquid Take 108 mLs by mouth daily in the afternoon.   . Multiple Vitamin (MULTIVITAMIN WITH MINERALS) TABS tablet Take 1 tablet by mouth daily. Mature mens 34+  . nortriptyline (PAMELOR) 25 MG capsule Take 25 mg by mouth at bedtime.   Marland Kitchen OVER THE COUNTER MEDICATION Take 3 tablets by mouth daily. Perdiem 15 mg  . polyethylene glycol (MIRALAX / GLYCOLAX) packet Take 17 g by mouth daily.  . Potassium Chloride ER 20 MEQ TBCR Take 20 mEq by mouth daily.  Marland Kitchen pyridOXINE (VITAMIN B-6) 100 MG tablet Take 100 mg by mouth daily.  Marland Kitchen tiZANidine (ZANAFLEX) 2 MG tablet Take 4 mg by mouth 3 (three) times daily.  . vitamin B-12 (CYANOCOBALAMIN) 1000 MCG tablet Take 1,000 mcg by mouth daily.   . vitamin C (ASCORBIC ACID) 500 MG tablet Take 500 mg by mouth daily.  . [DISCONTINUED] lidocaine (LIDODERM) 5 % Place 1 patch onto the skin daily. Remove & Discard patch within 12 hours or as directed by MD  . [DISCONTINUED] losartan (COZAAR) 25 MG tablet TAKE 1 TABLET BY MOUTH DAILY  . [DISCONTINUED] pregabalin (LYRICA) 150 MG capsule Take 150 mg by mouth daily.   . [DISCONTINUED] simvastatin (ZOCOR) 40 MG tablet Take 40 mg by mouth at bedtime.  . [DISCONTINUED] traZODone (DESYREL) 150 MG tablet Take 150 mg by mouth at bedtime as needed for sleep.     Allergies:   Baclofen, Doxazosin mesylate, and Diazepam   Social History   Socioeconomic History  . Marital status: Married    Spouse name:  Not on file  . Number of children: Not on file  . Years of education: Not on file  . Highest education level: Not on file  Occupational History  . Occupation: Retired    Comment: Worked for [employer] and retired in [place] Use  . Smoking status: Former Smoker    Years: 24.00    Types: Pipe    Quit date: 09/24/1978    Years since quitting: 42.3  . Smokeless tobacco: Never Used  Vaping Use  . Vaping Use: Never used  Substance and Sexual Activity  . Alcohol use: Yes    Comment: 3 drinks/day - gin or vodka   .  Drug use: No  . Sexual activity: Not on file  Other Topics Concern  . Not on file  Social History Narrative  . Not on file   Social Determinants of Health   Financial Resource Strain: Not on file  Food Insecurity: Not on file  Transportation Needs: Not on file  Physical Activity: Not on file  Stress: Not on file  Social Connections: Not on file     Family History: The patient's family history includes Leukemia in his son; Pancreatic cancer in his mother.  ROS:   Review of Systems  Constitution: Negative for decreased appetite, fever and weight gain.  HENT: Negative for congestion, ear discharge, hoarse voice and sore throat.   Eyes: Negative for discharge, redness, vision loss in right eye and visual halos.  Cardiovascular: Negative for chest pain, dyspnea on exertion, leg swelling, orthopnea and palpitations.  Respiratory: Negative for cough, hemoptysis, shortness of breath and snoring.   Endocrine: Negative for heat intolerance and polyphagia.  Hematologic/Lymphatic: Negative for bleeding problem. Does not bruise/bleed easily.  Skin: Negative for flushing, nail changes, rash and suspicious lesions.  Musculoskeletal: Negative for arthritis, joint pain, muscle cramps, myalgias, neck pain and stiffness.  Gastrointestinal: Negative for abdominal pain, bowel incontinence, diarrhea and excessive appetite.  Genitourinary: Negative for decreased libido, genital sores  and incomplete emptying.  Neurological: Negative for brief paralysis, focal weakness, headaches and loss of balance.  Psychiatric/Behavioral: Negative for altered mental status, depression and suicidal ideas.  Allergic/Immunologic: Negative for HIV exposure and persistent infections.    EKGs/Labs/Other Studies Reviewed:    The following studies were reviewed today:   EKG: None today  TTE 05/2019 IMPRESSIONS  1. The left ventricle has normal systolic function, with an ejection  fraction of 60-65%. The cavity size was normal. Left ventricular diastolic  Doppler parameters are consistent with impaired relaxation. Indeterminate  filling pressures.  2. Moderate mitral valve prolapse.  3. The mitral valve is abnormal. Moderate thickening of the mitral valve  leaflet. Mild calcification of the mitral valve leaflet. Mitral valve  regurgitation is moderate by color flow Doppler. The MR jet is  anteriorly-directed.  4. When compared to the prior study: 03/22/19 EF 45-50%. PA pressure  38mmHg, mild to moderate MR.   SUMMARY    Limited for LV function. LVEF 60-65%, there is mitral valve  thickening and calcification with late systolic prolapse of the  posterior mitral leaflet and moderate anteriorly directed mitral  regurgitation.  FINDINGS  Left Ventricle: The left ventricle has normal systolic function, with an  ejection fraction of 60-65%. The cavity size was normal. Left ventricular  diastolic Doppler parameters are consistent with impaired relaxation  (grade I). Indeterminate filling  pressures.     Left Atrium: Left atrial size was normal in size.   Mitral Valve: The mitral valve is abnormal. Moderate thickening of the  mitral valve leaflet. Mild calcification of the mitral valve leaflet.  Mitral valve regurgitation is moderate by color flow Doppler. The MR jet  is anteriorly-directed. There is  moderate late systolic prolapse of of the posterior mitral leaflet of the   mitral valve.   Compared to previous exam: 03/22/19 EF 45-50%. PA pressure 15mmHg, mild to moderate MR.      Recent Labs: 07/21/2020: ALT 20 09/23/2020: B Natriuretic Peptide 79.5; BUN 18; Creatinine, Ser 0.87; Hemoglobin 11.4; Platelets 165; Potassium 4.0; Sodium 140  Recent Lipid Panel    Component Value Date/Time   CHOL 139 03/22/2019 1200   TRIG 72 03/22/2019  1200   HDL 66 03/22/2019 1200   CHOLHDL 2.1 03/22/2019 1200   VLDL 14 03/22/2019 1200   LDLCALC 59 03/22/2019 1200    Physical Exam:    VS:  BP 112/60   Pulse 72   Ht 5\' 10"  (1.778 m)   Wt 151 lb 1.9 oz (68.5 kg)   SpO2 100%   BMI 21.68 kg/m     Wt Readings from Last 3 Encounters:  01/12/21 151 lb 1.9 oz (68.5 kg)  09/23/20 155 lb (70.3 kg)  08/17/20 155 lb (70.3 kg)     GEN: Well nourished, well developed in no acute distress HEENT: Normal NECK: No JVD; No carotid bruits LYMPHATICS: No lymphadenopathy CARDIAC: S1S2 noted,RRR, no murmurs, rubs, gallops RESPIRATORY:  Clear to auscultation without rales, wheezing or rhonchi  ABDOMEN: Soft, non-tender, non-distended, +bowel sounds, no guarding. EXTREMITIES: No edema, No cyanosis, no clubbing MUSCULOSKELETAL:  No deformity  SKIN: Warm and dry NEUROLOGIC:  Alert and oriented x 3, non-focal PSYCHIATRIC:  Normal affect, good insight  ASSESSMENT:    1. Essential hypertension   2. LBBB (left bundle branch block)   3. Mixed hyperlipidemia   4. Moderate mitral regurgitation    PLAN:     He appears to be doing well from a cardiovascular standpoint.  He is euvolemic.  He is here today with his wife.  I reviewed lab work which was done in December 2021 for BMP, CBC no need for any lab work today as the patient will see his PCP soon plan to get blood work.  The patient is in agreement with the above plan. The patient left the office in stable condition.  The patient will follow up in 6 months or sooner if needed   Medication Adjustments/Labs and Tests  Ordered: Current medicines are reviewed at length with the patient today.  Concerns regarding medicines are outlined above.  No orders of the defined types were placed in this encounter.  Meds ordered this encounter  Medications  . losartan (COZAAR) 25 MG tablet    Sig: Take 1 tablet (25 mg total) by mouth daily.    Dispense:  90 tablet    Refill:  3    Patient Instructions  Medication Instructions:  Your physician recommends that you continue on your current medications as directed. Please refer to the Current Medication list given to you today.  *If you need a refill on your cardiac medications before your next appointment, please call your pharmacy*   Lab Work: NONE If you have labs (blood work) drawn today and your tests are completely normal, you will receive your results only by: Marland Kitchen MyChart Message (if you have MyChart) OR . A paper copy in the mail If you have any lab test that is abnormal or we need to change your treatment, we will call you to review the results.   Testing/Procedures: NONE   Follow-Up: At [facility], you and your health needs are our priority.  As part of our continuing mission to provide you with exceptional heart care, we have created designated Provider Care Teams.  These Care Teams include your primary Cardiologist (physician) and Advanced Practice Providers (APPs -  Physician Assistants and Nurse Practitioners) who all work together to provide you with the care you need, when you need it.  We recommend signing up for the patient portal called "MyChart".  Sign up information is provided on this After Visit Summary.  MyChart is used to connect with patients for Virtual Visits (Telemedicine).  Patients are able to view lab/test results, encounter notes, upcoming appointments, etc.  Non-urgent messages can be sent to your provider as well.   To learn more about what you can do with MyChart, go to [email or web address].    Your next appointment:    6 month(s)  The format for your next appointment:   In Person  Provider:  Godfrey Pick Mohannad Olivero   Other Instructions      Adopting a Healthy Lifestyle.  Know what a healthy weight is for you (roughly BMI <25) and aim to maintain this   Aim for 7+ servings of fruits and vegetables daily   65-80+ fluid ounces of water or unsweet tea for healthy kidneys   Limit to max 1 drink of alcohol per day; avoid smoking/tobacco   Limit animal fats in diet for cholesterol and heart health - choose grass fed whenever available   Avoid highly processed foods, and foods high in saturated/trans fats   Aim for low stress - take time to unwind and care for your mental health   Aim for 150 min of moderate intensity exercise weekly for heart health, and weights twice weekly for bone health   Aim for 7-9 hours of sleep daily   When it comes to diets, agreement about the perfect plan isnt easy to find, even among the experts. Experts at the [employer] developed an idea known as the Healthy Eating Plate. Just imagine a plate divided into logical, healthy portions.   The emphasis is on diet quality:   Load up on vegetables and fruits - one-half of your plate: Aim for color and variety, and remember that potatoes dont count.   Go for whole grains - one-quarter of your plate: Whole wheat, barley, wheat berries, quinoa, oats, brown rice, and foods made with them. If you want pasta, go with whole wheat pasta.   Protein power - one-quarter of your plate: Fish, chicken, beans, and nuts are all healthy, versatile protein sources. Limit red meat.   The diet, however, does go beyond the plate, offering a few other suggestions.   Use healthy plant oils, such as olive, canola, soy, corn, sunflower and peanut. Check the labels, and avoid partially hydrogenated oil, which have unhealthy trans fats.   If youre thirsty, drink water. Schmidt and tea are good in moderation, but skip sugary drinks  and limit milk and dairy products to one or two daily servings.   The type of carbohydrate in the diet is more important than the amount. Some sources of carbohydrates, such as vegetables, fruits, whole grains, and beans-are healthier than others.   Finally, stay active  Signed, Berniece Salines, DO  01/12/2021 1:47 PM    [facility]

## 2021-01-12 NOTE — Patient Instructions (Signed)
Medication Instructions:  Your physician recommends that you continue on your current medications as directed. Please refer to the Current Medication list given to you today.  *If you need a refill on your cardiac medications before your next appointment, please call your pharmacy*   Lab Work: NONE If you have labs (blood work) drawn today and your tests are completely normal, you will receive your results only by: Marland Kitchen MyChart Message (if you have MyChart) OR . A paper copy in the mail If you have any lab test that is abnormal or we need to change your treatment, we will call you to review the results.   Testing/Procedures: NONE   Follow-Up: At [facility], you and your health needs are our priority.  As part of our continuing mission to provide you with exceptional heart care, we have created designated Provider Care Teams.  These Care Teams include your primary Cardiologist (physician) and Advanced Practice Providers (APPs -  Physician Assistants and Nurse Practitioners) who all work together to provide you with the care you need, when you need it.  We recommend signing up for the patient portal called "MyChart".  Sign up information is provided on this After Visit Summary.  MyChart is used to connect with patients for Virtual Visits (Telemedicine).  Patients are able to view lab/test results, encounter notes, upcoming appointments, etc.  Non-urgent messages can be sent to your provider as well.   To learn more about what you can do with MyChart, go to [email or web address].    Your next appointment:   6 month(s)  The format for your next appointment:   In Person  Provider:  Berniece Salines   Other Instructions

## 2021-01-24 NOTE — Progress Notes (Signed)
Assessment/Plan:   1.  Parkinsons Disease   -Continue carbidopa/levodopa 25/100, 1 tablet 3 times per day  -PT RX for pennybyrn  2.  Chronic low back pain  -On chronic Dilaudid.  PDMP reviewed.  Uses number 150/month.  I do have concerns about this in his age group with the diagnosis of Parkinson's disease.  Long discussion with him about this.   He is sleeping all day per wife.  As he and I previously discussed, I also have some concerns about the nortriptyline, although he is on a very low-dose of this at bedtime.   3.  eds  -suspect related to chronic narcotic therapy and the fact he sleeps all day and up all night (day/night reversal).  On trazodone and pamelor which I don't care for.  He is to discuss with PCP.  He is a large fall risk  4.  Depression  -related primarily to pain issues.    -met with my lcsw today.  Services offered   Subjective:   Aaron Schmidt was seen today in follow up for Parkinsons disease.  Wife with patient and supplements hx.  This is a new diagnosis last visit.  My previous records were reviewed prior to todays visit as well as outside records available to me.  Patient was started on levodopa last visit.  He has had no side effects with the medication.  He did not want to start physical therapy last visit because he was moving to [place].  Patient was in the emergency room November 24 after a fall.  Those records have been reviewed.  He was walking from his kitchen to his garage and tripped and fell and landed on his back.  CT brain was negative.  No acute rib fractures, there was evidence of chronic bilateral rib fractures.  Pt denies lightheadedness, near syncope.  No hallucinations.  Mood has been poor and depressed and pt thinks related to back pain.  Wife states that pt spends day sleeping but he c/o not sleeping at night despite taking several medications.  In regards to alcohol, the patient reports that he is not drinking much now - "I had half a  beer last night but not generally drinking."  Not exercising  Current prescribed movement disorder medications: carbidopa/levodopa 25/100 tid (started last visit)   PREVIOUS MEDICATIONS: Sinemet  ALLERGIES:   Allergies  Allergen Reactions  . Baclofen Other (See Comments)    CONFUSION, BODY TREMORS  . Doxazosin Mesylate Other (See Comments)    Other reaction(s): low BP  . Diazepam Other (See Comments)    "makes goofy" Other reaction(s): confusion    CURRENT MEDICATIONS:  Outpatient Encounter Medications as of 01/25/2021  Medication Sig  . carbidopa-levodopa (SINEMET IR) 25-100 MG tablet Take 1 tablet by mouth 3 (three) times daily. 7am/11am/4pm  . Cholecalciferol (VITAMIN D) 2000 UNITS CAPS Take 2,000 Units by mouth daily.  . finasteride (PROSCAR) 5 MG tablet Take 5 mg by mouth daily.  . fluocinonide cream (LIDEX) 1.61 % Apply 1 application topically daily as needed (itching).  . furosemide (LASIX) 40 MG tablet Take 40 mg by mouth daily.  Marland Kitchen HYDROmorphone (DILAUDID) 2 MG tablet Take 2 tablets (4 mg total) by mouth 4 (four) times daily. (Patient taking differently: Take 4 mg by mouth. Take 1 tab every 5 hours as needed for pain do not exceed more than 5 per day)  . L-Lysine 500 MG CAPS Take 500 mg by mouth daily.  Marland Kitchen losartan (COZAAR) 25  MG tablet Take 1 tablet (25 mg total) by mouth daily.  . mineral oil liquid Take 108 mLs by mouth daily in the afternoon.   . Multiple Vitamin (MULTIVITAMIN WITH MINERALS) TABS tablet Take 1 tablet by mouth daily. Mature mens 31+  . nortriptyline (PAMELOR) 25 MG capsule Take 25 mg by mouth at bedtime.   Marland Kitchen OVER THE COUNTER MEDICATION Take 3 tablets by mouth daily. Perdiem 15 mg  . polyethylene glycol (MIRALAX / GLYCOLAX) packet Take 17 g by mouth daily.  . Potassium Chloride ER 20 MEQ TBCR Take 20 mEq by mouth daily.  Marland Kitchen pyridOXINE (VITAMIN B-6) 100 MG tablet Take 100 mg by mouth daily.  Marland Kitchen tiZANidine (ZANAFLEX) 2 MG tablet Take 4 mg by mouth 3 (three)  times daily.  . vitamin B-12 (CYANOCOBALAMIN) 1000 MCG tablet Take 1,000 mcg by mouth daily.   . vitamin C (ASCORBIC ACID) 500 MG tablet Take 500 mg by mouth daily.  Marland Kitchen aspirin EC 81 MG tablet Take 81 mg by mouth daily. (Patient not taking: Reported on 01/25/2021)  . diclofenac (VOLTAREN) 75 MG EC tablet Take 75 mg by mouth 2 (two) times daily. (Patient not taking: Reported on 01/25/2021)  . docusate sodium (COLACE) 100 MG capsule Take 600 mg by mouth daily. (Patient not taking: Reported on 01/25/2021)   No facility-administered encounter medications on file as of 01/25/2021.    Objective:   PHYSICAL EXAMINATION:    VITALS:   Vitals:   01/25/21 0818  BP: 118/64  Pulse: 73  SpO2: 99%  Weight: 148 lb (67.1 kg)  Height: $Remove'5\' 10"'MzeUhDs$  (1.778 m)    GEN:  The patient appears stated age and is in NAD. HEENT:  Normocephalic, atraumatic.  The mucous membranes are moist. The superficial temporal arteries are without ropiness or tenderness. CV:  RRR Lungs:  CTAB Neck/HEME:  There are no carotid bruits bilaterally.  Neurological examination:  Orientation: The patient is alert and oriented x3. Cranial nerves: There is good facial symmetry with facial hypomimia. The speech is fluent and clear. Soft palate rises symmetrically and there is no tongue deviation. Hearing is intact to conversational tone. Sensation: Sensation is intact to light touch throughout Motor: Strength is at least antigravity x4.  Movement examination: Tone: There is mild increased tone in the LUE Abnormal movements: none Coordination:  There is mild decremation with RAM's, mostly with toe and heel taps on the L Gait and Station: Patient pushes off of the chair to arise.  The patient's stride length is decreased with decreased arm on the L.   I have reviewed and interpreted the following labs independently    Chemistry      Component Value Date/Time   NA 140 09/23/2020 1920   NA 142 06/30/2019 0905   K 4.0 09/23/2020 1920   CL  105 09/23/2020 1920   CO2 28 09/23/2020 1920   BUN 18 09/23/2020 1920   BUN 23 06/30/2019 0905   CREATININE 0.87 09/23/2020 1920      Component Value Date/Time   CALCIUM 9.3 09/23/2020 1920   ALKPHOS 69 07/21/2020 2119   AST 56 (H) 07/21/2020 2119   ALT 20 07/21/2020 2119   BILITOT 1.1 07/21/2020 2119       Lab Results  Component Value Date   WBC 6.7 09/23/2020   HGB 11.4 (L) 09/23/2020   HCT 34.7 (L) 09/23/2020   MCV 96.9 09/23/2020   PLT 165 09/23/2020    Lab Results  Component Value Date   TSH  0.731 09/21/2017     Total time spent on today's visit was 30 minutes, including both face-to-face time and nonface-to-face time.  Time included that spent on review of records (prior notes available to me/labs/imaging if pertinent), discussing treatment and goals, answering patient's questions and coordinating care.  Cc:  Hulan Fess, MD

## 2021-01-25 ENCOUNTER — Encounter: Payer: Self-pay | Admitting: Neurology

## 2021-01-25 ENCOUNTER — Other Ambulatory Visit: Payer: Self-pay

## 2021-01-25 ENCOUNTER — Ambulatory Visit: Payer: Medicare Other | Admitting: Neurology

## 2021-01-25 VITALS — BP 118/64 | HR 73 | Ht 70.0 in | Wt 148.0 lb

## 2021-01-25 DIAGNOSIS — G20 Parkinson's disease: Secondary | ICD-10-CM | POA: Diagnosis not present

## 2021-01-25 MED ORDER — CARBIDOPA-LEVODOPA 25-100 MG PO TABS: 1.0000 | ORAL_TABLET | Freq: Three times a day (TID) | ORAL | 1 refills | Status: DC

## 2021-04-10 ENCOUNTER — Other Ambulatory Visit: Payer: Self-pay | Admitting: Neurology

## 2021-04-10 DIAGNOSIS — G20 Parkinson's disease: Secondary | ICD-10-CM

## 2021-04-14 ENCOUNTER — Other Ambulatory Visit: Payer: Self-pay

## 2021-04-14 ENCOUNTER — Ambulatory Visit: Payer: Medicare Other | Attending: Neurology | Admitting: Physical Therapy

## 2021-04-14 ENCOUNTER — Encounter: Payer: Self-pay | Admitting: Physical Therapy

## 2021-04-14 DIAGNOSIS — M546 Pain in thoracic spine: Secondary | ICD-10-CM | POA: Insufficient documentation

## 2021-04-14 DIAGNOSIS — M62838 Other muscle spasm: Secondary | ICD-10-CM | POA: Diagnosis present

## 2021-04-14 DIAGNOSIS — R2689 Other abnormalities of gait and mobility: Secondary | ICD-10-CM | POA: Insufficient documentation

## 2021-04-14 DIAGNOSIS — R293 Abnormal posture: Secondary | ICD-10-CM | POA: Diagnosis present

## 2021-04-14 DIAGNOSIS — M6281 Muscle weakness (generalized): Secondary | ICD-10-CM | POA: Insufficient documentation

## 2021-04-14 NOTE — Therapy (Signed)
[facility] [address]  [place], [place], [address] Phone: [phone]   Fax:  [phone]  Physical Therapy Evaluation  Patient Details  Name: Aaron Schmidt MRN: [id number] Date of Birth: August 23, 1933 Referring Provider (PT): Tat, Wells Guiles   Encounter Date: 04/14/2021   PT End of Session - 04/14/21 1104     Visit Number 1    Number of Visits 10    Date for PT Re-Evaluation 05/26/21    Authorization Type [employer] Medicare - has had 10 previous PT visits, so KX needed after visit 10    Progress Note Due on Visit 10    PT Start Time 0945    PT Stop Time 1035    PT Time Calculation (min) 50 min    Activity Tolerance Patient tolerated treatment well    Behavior During Therapy [facility] for tasks assessed/performed             Past Medical History:  Diagnosis Date   Acute kidney injury superimposed on chronic kidney disease ([place]) 09/05/2017   Anemia 09/21/2017   Anemia due to chronic blood loss 01/10/2021   Arthritis    Degeneration spine & stenosis   Benign prostatic hyperplasia 01/10/2021   Benign prostatic hyperplasia with lower urinary tract symptoms 01/10/2021   Body mass index (BMI) 25.0-25.9, adult 08/31/2019   CAD (coronary artery disease) 04/20/2019   Cervical myelopathy with cervical radiculopathy ([place]) 06/24/2014   Cervical spondylosis without myelopathy 05/12/2014   Chronic bilateral low back pain 07/26/2020   Chronic kidney disease, stage 3 unspecified ([place]) 01/10/2021   Chronic low back pain 07/29/2013   Chronic pain 09/05/2017   Degenerative cervical spinal stenosis 05/26/2014   Degenerative lumbar spinal stenosis 09/22/2019   Displacement of cervical intervertebral disc 07/19/2014   Edema 01/10/2021   Elevated troponin 09/05/2017   Empyema lung ([place]) 09/09/2017   Enlarged prostate 01/10/2021   Essential hypertension 08/29/2010   Qualifier: Diagnosis of  By: Elsworth Soho MD, Leanna Sato     Fall in home 07/10/2017    Hardening of the aorta (main artery of the heart) ([place]) 01/10/2021   History of kidney stones    Hyperlipidemia 06/21/2014   Hypertension    has a histroy of   Hypoalbuminemia 01/10/2021   Hyponatremia 09/05/2017   Hypotension    Impaired fasting glucose 01/10/2021   Intermittent tremor 01/10/2021   Iron deficiency anemia 01/10/2021   Kidney stone 01/10/2021   LBBB (left bundle branch block) 06/21/2014   Lumbar post-laminectomy syndrome 07/15/2013   Lumbar radiculopathy 07/29/2013   Lumbar stenosis with neurogenic claudication 08/31/2019   Malnutrition of moderate degree 03/23/2019   Mitral valve disorder 01/10/2021   Murmur 01/08/2017   Muscle pain 03/01/2020   Myocardial infarction ([place]) 03/21/2019   Neck pain 03/03/2014   Normochromic normocytic anemia 09/05/2017   NSTEMI (non-ST elevated myocardial infarction) ([place]) 03/21/2019   Obesity 04/06/2019   Obstructive sleep apnea 09/30/2010   PSG (180 lbs ) >> severe obstructive sleep apnea with AHi 38/h & desatns to 82%  Started on auto 5-15  >> changed to 8 cm Download reviewed 1/21 - 11/23/10 >> residual events of 6/h , avg pr 8 cm, leak + with nasal pillows     Occasional tremors 07/26/2020   Old myocardial infarction 01/10/2021   Osteoarthrosis 01/10/2021   Osteopenia 01/10/2021   Other specified disorders of kidney and ureter 01/10/2021   Parkinson's disease ([place])    Peripheral edema 01/10/2021  Peripheral neuralgia 01/10/2021   Personal history of colonic polyps 01/10/2021   Personal history of other diseases of the respiratory system 01/10/2021   Pleural effusion on left 08/19/2017   Acute symptoms since 08/13/17 > CTa  08/13/17  neg pe/ extensively loculated effusion - L thoracentesis 08/20/2017 :  220 cc with glucose < 20 and WBC 5,338 mostly Poly, LDH 688 > cyt neg >Referrd to T surgery > VATS 09/09/17 c/w empyema   Pleural effusion, left 09/04/2017   Preoperative cardiovascular examination 06/21/2014   Protein-calorie malnutrition, severe  09/11/2017   Pure hypercholesterolemia 01/10/2021   Sciatica 01/10/2021   Scoliosis concern 08/31/2019   Sensorineural hearing loss (SNHL) of both ears 06/15/2020   Last Assessment & Plan:  Formatting of this note might be different from the original. Concern over worsening hearing. Chronic history of sensorineural hearing loss bilaterally.  Has hearing aids currently.  He feels his hearing aids are not working as well as they historically have.  Recently had them checked. EXAM shows normal external canals and tympanic membranes bilaterally. AUDIOGRAM Shows B   Shoulder joint pain 12/01/2013   Sinusitis, chronic 08/19/2017   CT sinus  08/20/2017 >>> Normally aerated paranasal sinuses.  Patent sinus drainage pathways.   SIRS (systemic inflammatory response syndrome) ([place]) 09/05/2017   Skin ulcer of toe of left foot, limited to breakdown of skin ([place]) 09/04/2016   Sleep apnea 2012   used CPAP 2 yrs. ago, feels he sleeps better w/o, no longer using    Spinal stenosis of lumbar region 08/10/2013   Spinal stenosis, lumbar region without neurogenic claudication 08/19/2019   Symptomatic anemia 09/20/2017   Thrombocytopenia ([place]) 01/10/2021   Tubular adenoma of colon 01/10/2021   Unspecified abnormal finding in specimens from other organs, systems and tissues 01/10/2021    Past Surgical History:  Procedure Laterality Date   ANTERIOR LAT LUMBAR FUSION N/A 09/22/2019   Procedure: Lumbar one-two, Lumbar two-three Anterolateral lumbar interbody fusion with pedicle screw fixation and exploration of adjacent fusion;  Surgeon: Erline Levine, MD;  Location: [facility];  Service: Neurosurgery;  Laterality: N/A;   ARM NEUROPLASTY     at 14 yrs. of age- fell off tractor- had fracture & repair *& later- 1990's had  transplantation of a nerve at the elbow   BACK SURGERY     x4 back surgery x2 fusion -   CARDIAC CATHETERIZATION  03/23/2019   CARPAL TUNNEL RELEASE Right    COLONOSCOPY     ESOPHAGOGASTRODUODENOSCOPY  (EGD) WITH PROPOFOL N/A 09/23/2017   Procedure: ESOPHAGOGASTRODUODENOSCOPY (EGD) WITH PROPOFOL;  Surgeon: Laurence Spates, MD;  Location: [facility];  Service: Endoscopy;  Laterality: N/A;   LEFT HEART CATH AND CORONARY ANGIOGRAPHY N/A 03/23/2019   Procedure: LEFT HEART CATH AND CORONARY ANGIOGRAPHY;  Surgeon: Burnell Blanks, MD;  Location: [facility];  Service: Cardiovascular;  Laterality: N/A;   LUMBAR PERCUTANEOUS PEDICLE SCREW 2 LEVEL N/A 09/22/2019   Procedure: LUMBAR PERCUTANEOUS PEDICLE SCREW TWO LEVEL;  Surgeon: Erline Levine, MD;  Location: [facility];  Service: Neurosurgery;  Laterality: N/A;   PLEURAL EFFUSION DRAINAGE Left 09/09/2017   Procedure: DRAINAGE OF PLEURAL EFFUSION;  Surgeon: [name], MD;  Location: [facility];  Service: Thoracic;  Laterality: Left;   POSTERIOR CERVICAL FUSION/FORAMINOTOMY N/A 06/24/2014   Procedure: Posterior Cervical Three-Seven Fusion with Lateral Mass Fixation;  Surgeon: Erline Levine, MD;  Location: [facility];  Service: Neurosurgery;  Laterality: N/A;  C3-C7 posterior cervical fusion with lateral mass fixation  SHOULDER SURGERY Bilateral    VIDEO ASSISTED THORACOSCOPY (VATS)/DECORTICATION Left 09/09/2017   Procedure: VIDEO ASSISTED THORACOSCOPY (VATS)/DECORTICATION;  Surgeon: [name], MD;  Location: [facility];  Service: Thoracic;  Laterality: Left;   VIDEO BRONCHOSCOPY  09/09/2017   Procedure: VIDEO BRONCHOSCOPY;  Surgeon: [name], MD;  Location: [facility];  Service: Thoracic;;    There were no vitals filed for this visit.    Subjective Assessment - 04/14/21 1010     Subjective Pt reports the pain in his left upper back (under shoulder blade) "a hot spot" started about a year ago, after a fall.  He has gotten 3 cortisone injections, the first 2 helped but the 3rd didn't.  He has been getting PT at [facility] but with little benefit - "it helps for half a day, maybe."  He is here to see if dry needling would  help.    Pertinent History Parkinson's disease, frequent falls, chronic LBP, history of multiple back/neck surgeries and fusions.    Limitations Sitting;Reading    How long can you sit comfortably? 1 hour    How long can you stand comfortably? 5 minutes    How long can you walk comfortably? 600 steps 2-3x/day    Diagnostic tests Chest Xray 08/17/20 - Musculoskeletal: A chronic posterolateral tenth right rib fracture  is seen. Additional chronic deformity involving the first left rib  is noted.    Patient Stated Goals to get rid of pain so can do more during the day    Currently in Pain? Yes    Pain Score 4     Pain Location Back    Pain Orientation Upper;Left    Pain Descriptors / Indicators Burning    Pain Type Chronic pain    Pain Radiating Towards NA    Pain Onset Other (comment)   year ago   Aggravating Factors  sitting, standing    Pain Relieving Factors laying down    Effect of Pain on Daily Activities sleep all day, hardly any activities due to pain                [facility] PT Assessment - 04/14/21 0001       Assessment   Medical Diagnosis Parkinson's Disease    Referring Provider (PT) Tat, Wells Guiles    Onset Date/Surgical Date --   1 year   Hand Dominance Right    Next MD Visit none scheduled    Prior Therapy yes, 10 sessions at [facility]      Precautions   Precautions Fall    Precaution Comments no lifting more than 10 lbs per patient      Balance Screen   Has the patient fallen in the past 6 months Yes    How many times? 2   and lost balance multiple times caught self   Has the patient had a decrease in activity level because of a fear of falling?  Yes    Is the patient reluctant to leave their home because of a fear of falling?  Yes      Wing Private residence    Living Arrangements Spouse/significant other    Type of [place] Access Level entry    [place] - single point;Grab  bars - toilet;Grab bars - tub/shower;Shower seat      Prior Function   Level of Independence Requires assistive device for independence  Cognition   Overall Cognitive Status No family/caregiver present to determine baseline cognitive functioning      Observation/Other Assessments   Observations Noted very slow gait with SPC, very thin, decreased muscle mass over back with wingling L scapula    Focus on Therapeutic Outcomes (FOTO)  37      Posture/Postural Control   Posture/Postural Control Postural limitations    Postural Limitations Rounded Shoulders;Increased thoracic kyphosis;Decreased lumbar lordosis    Posture Comments limited moblity due to neck and lumbar fusions      ROM / Strength   AROM / PROM / Strength Strength;AROM      AROM   AROM Assessment Site Shoulder    Right/Left Shoulder Right;Left    Right Shoulder Flexion 130 Degrees    Right Shoulder ABduction 110 Degrees    Left Shoulder Flexion 105 Degrees    Left Shoulder ABduction 110 Degrees      Strength   Overall Strength Within functional limits for tasks performed    Strength Assessment Site Shoulder;Hip;Knee    Right/Left Shoulder Right;Left    Right Shoulder Flexion 4+/5    Right Shoulder ABduction 4/5    Right Shoulder Internal Rotation 4/5    Right Shoulder External Rotation 4/5    Left Shoulder Flexion 4/5    Left Shoulder Internal Rotation 4/5    Left Shoulder External Rotation 4/5    Right/Left Hip Right;Left    Right Hip Flexion 4+/5    Right Hip ABduction 4+/5    Right Hip ADduction 4+/5    Left Hip Flexion 4+/5    Left Hip ABduction 4+/5    Left Hip ADduction 4+/5    Right/Left Knee Right;Left    Right Knee Extension 4+/5    Left Knee Extension 4+/5      Palpation   Spinal mobility hypomobile throughout cervical and thoracic spine.    Palpation comment palpable trigger points in L thoracic multifidus T6-T10      Transfers   Transfers Sit to Stand    Five time sit to stand comments   30 seconds                        Objective measurements completed on examination: See above findings.               PT Education - 04/14/21 1103     Education Details educated on findings, POC, risks of dry needling, review of current HEP, recommended increasing reps of sit to stands at home and starting to use less support (1 UE instead of 2UE assist).    Person(s) Educated Patient    Methods Explanation;Demonstration    Comprehension Verbalized understanding              PT Short Term Goals - 04/14/21 1116       PT SHORT TERM GOAL #1   Title Pt. will be compliant with initial HEP for scapular strengthening to decrease pain    Time 2    Period Weeks    Status New    Target Date 04/28/21               PT Long Term Goals - 04/14/21 1117       PT LONG TERM GOAL #1   Title Pt. will report no more than 3/10 L thoracic pain with sitting for 2 hours to be able to go out to lunch    Baseline 4/10 at rest increases to 8/10  with prolonged sitting, stays inside all the time    Time 6    Period Weeks    Status New    Target Date 05/26/21      PT LONG TERM GOAL #2   Title Pt. will be compliant with advanced HEP for postural strengthening to improve outcomes.    Time 6    Period Weeks    Status New    Target Date 05/26/21      PT LONG TERM GOAL #3   Title Pt. will demonstrate improved functional strength as demonstrated by improved sit to stand time by 5 seconds to decrease fall risk.    Baseline 30 seconds    Time 6    Period Weeks    Status New    Target Date 05/26/21      PT LONG TERM GOAL #4   Title Pt. will report 50% improvement in thoracic pain to improve QOL    Time 6    Period Weeks    Status New    Target Date 05/26/21                    Plan - 04/14/21 1106     Clinical Impression Statement Aaron Schmidt is an 87 year old male referred for L upper back pain that has been present for about  1 year, despite  cortisone shots x 3 and recent PT.  He was referred for dry needling.  He has history of Parkinson's, multi-level fusions in cervical and lumbar spine and chronic pain in lumbar spine.  He demonstrates significant hypomobility in thoracic spine as well which may be contributing to condition, as well as decreased muscle mass in L periscapular musculature.  Discussed at length today risk factors for dry needling over this area, since we are over lung field, he has decreased muscle mass and osteopenia, and also noted history of rib fractures on Xray, which increases risk.  Educated on importance of muscle strengthening and improving thoracic mobility, otherwise trigger points in his thoracic multifidus are likely to return.  Recommended trying ice pack to region to help decrease muscle spasm, since hot packs only provide limited relief, and to continue theraband exercises for shoulder strengthening, but try seated instead of standing to decrease complaint of low back pain.    Personal Factors and Comorbidities Age;Comorbidity 3+    Comorbidities Parkinson's, arthritis, chronic pain, multiple back surgeries/fusions, CKD, falls    Examination-Activity Limitations Sit;Stand;Bend;Lift;Sleep    Examination-Participation Restrictions Interpersonal Relationship;Community Activity    Stability/Clinical Decision Making Evolving/Moderate complexity    Clinical Decision Making Moderate    Rehab Potential Fair    PT Frequency 2x / week    PT Duration 6 weeks    PT Treatment/Interventions ADLs/Self Care Home Management;Cryotherapy;Electrical Stimulation;Moist Heat;Ultrasound;Gait training;Functional mobility training;Therapeutic activities;Therapeutic exercise;Balance training;Neuromuscular re-education;Patient/family education;Manual techniques;Passive range of motion;Dry needling;Taping;Spinal Manipulations;Joint Manipulations    PT Next Visit Plan review exercises for scapular strengthening, thoracic mobility, manual  therapy/modalities to L thoracic spine    Consulted and Agree with Plan of Care Patient             Patient will benefit from skilled therapeutic intervention in order to improve the following deficits and impairments:  Decreased endurance, Decreased mobility, Hypomobility, Increased muscle spasms, Decreased range of motion, Decreased activity tolerance, Decreased safety awareness, Decreased strength, Impaired flexibility, Postural dysfunction, Pain  Visit Diagnosis: Pain in thoracic spine  Muscle weakness (generalized)  Other muscle spasm  Abnormal posture  Other  abnormalities of gait and mobility     Problem List Patient Active Problem List   Diagnosis Date Noted   Moderate mitral regurgitation 01/12/2021   Anemia due to chronic blood loss 01/10/2021   Benign prostatic hyperplasia 01/10/2021   Benign prostatic hyperplasia with lower urinary tract symptoms 01/10/2021   Chronic kidney disease, stage 3 unspecified ([place]) 01/10/2021   Peripheral edema 01/10/2021   Enlarged prostate 01/10/2021   Hardening of the aorta (main artery of the heart) ([place]) 01/10/2021   Hypoalbuminemia 01/10/2021   Impaired fasting glucose 01/10/2021   Iron deficiency anemia 01/10/2021   Kidney stone 01/10/2021   Mitral valve disorder 01/10/2021   Osteopenia 01/10/2021   Other specified disorders of kidney and ureter 01/10/2021   Peripheral neuralgia 01/10/2021   Sciatica 01/10/2021   Personal history of colonic polyps 01/10/2021   Personal history of other diseases of the respiratory system 01/10/2021   Pure hypercholesterolemia 01/10/2021   Thrombocytopenia ([place]) 01/10/2021   Tubular adenoma of colon 01/10/2021   Unspecified abnormal finding in specimens from other organs, systems and tissues 01/10/2021   Edema 01/10/2021   Old myocardial infarction 01/10/2021   Osteoarthrosis 01/10/2021   Intermittent tremor 01/10/2021   Arthritis    History of kidney stones    Hypertension     Parkinson's disease ([place])    Occasional tremors 07/26/2020   Chronic bilateral low back pain 07/26/2020   Sensorineural hearing loss (SNHL) of both ears 06/15/2020   Muscle pain 03/01/2020   Degenerative lumbar spinal stenosis 09/22/2019   Body mass index (BMI) 25.0-25.9, adult 08/31/2019   Scoliosis concern 08/31/2019   Lumbar stenosis with neurogenic claudication 08/31/2019   Spinal stenosis, lumbar region without neurogenic claudication 08/19/2019   Hypotension 06/23/2019   Myocarditis ([place]) 05/19/2019   CAD (coronary artery disease) 04/20/2019   Obesity 04/06/2019   Malnutrition of moderate degree 03/23/2019   NSTEMI (non-ST elevated myocardial infarction) ([place]) 03/21/2019   Myocardial infarction ([place]) 03/21/2019   Anemia 09/21/2017   Symptomatic anemia 09/20/2017   Protein-calorie malnutrition, severe 09/11/2017   Empyema lung ([place]) 09/09/2017   SIRS (systemic inflammatory response syndrome) ([place]) 09/05/2017   Elevated troponin 09/05/2017   Normochromic normocytic anemia 09/05/2017   Hyponatremia 09/05/2017   Acute kidney injury superimposed on chronic kidney disease ([place]) 09/05/2017   Chronic pain 09/05/2017   Pleural effusion, left 09/04/2017   Pleural effusion on left 08/19/2017   Sinusitis, chronic 08/19/2017   Fall in home 07/10/2017   Murmur 01/08/2017   Skin ulcer of toe of left foot, limited to breakdown of skin ([place]) 09/04/2016   Displacement of cervical intervertebral disc 07/19/2014   Cervical myelopathy with cervical radiculopathy ([place]) 06/24/2014   LBBB (left bundle branch block) 06/21/2014   Hyperlipidemia 06/21/2014   Preoperative cardiovascular examination 06/21/2014   Degenerative cervical spinal stenosis 05/26/2014   Cervical spondylosis without myelopathy 05/12/2014   Neck pain 03/03/2014   Shoulder joint pain 12/01/2013   Spinal stenosis of lumbar region 08/10/2013   Chronic low back pain 07/29/2013   Lumbar radiculopathy 07/29/2013   Lumbar  post-laminectomy syndrome 07/15/2013   Obstructive sleep apnea 09/30/2010   Sleep apnea 2012   Essential hypertension 08/29/2010    Rennie Natter PT, DPT 04/14/2021, 11:25 AM  [facility] [address]  [place], [place], [address] Phone: [phone]   Fax:  [phone]  Name: Aaron Schmidt MRN: [id number] Date of Birth: 21-May-1933

## 2021-04-17 ENCOUNTER — Other Ambulatory Visit: Payer: Self-pay

## 2021-04-17 ENCOUNTER — Ambulatory Visit: Payer: Medicare Other | Admitting: Physical Therapy

## 2021-04-17 DIAGNOSIS — M546 Pain in thoracic spine: Secondary | ICD-10-CM

## 2021-04-17 DIAGNOSIS — M6281 Muscle weakness (generalized): Secondary | ICD-10-CM

## 2021-04-17 DIAGNOSIS — R293 Abnormal posture: Secondary | ICD-10-CM

## 2021-04-17 DIAGNOSIS — M62838 Other muscle spasm: Secondary | ICD-10-CM

## 2021-04-17 DIAGNOSIS — R2689 Other abnormalities of gait and mobility: Secondary | ICD-10-CM

## 2021-04-17 NOTE — Therapy (Signed)
[facility] [address]  [place], [place], [address] Phone: [phone]   Fax:  [phone]  Physical Therapy Treatment  Patient Details  Name: Aaron Schmidt MRN: [id number] Date of Birth: 1933-01-29 Referring Provider (PT): Tat, Wells Guiles   Encounter Date: 04/17/2021   PT End of Session - 04/17/21 1718     Visit Number 2    Number of Visits 10    Date for PT Re-Evaluation 05/26/21    Authorization Type [employer] Medicare - has had 10 previous PT visits, so KX needed after visit 10    Progress Note Due on Visit 10    PT Start Time 1615    PT Stop Time 1700    PT Time Calculation (min) 45 min    Activity Tolerance Patient limited by pain    Behavior During Therapy [facility] for tasks assessed/performed             Past Medical History:  Diagnosis Date   Acute kidney injury superimposed on chronic kidney disease ([place]) 09/05/2017   Anemia 09/21/2017   Anemia due to chronic blood loss 01/10/2021   Arthritis    Degeneration spine & stenosis   Benign prostatic hyperplasia 01/10/2021   Benign prostatic hyperplasia with lower urinary tract symptoms 01/10/2021   Body mass index (BMI) 25.0-25.9, adult 08/31/2019   CAD (coronary artery disease) 04/20/2019   Cervical myelopathy with cervical radiculopathy ([place]) 06/24/2014   Cervical spondylosis without myelopathy 05/12/2014   Chronic bilateral low back pain 07/26/2020   Chronic kidney disease, stage 3 unspecified ([place]) 01/10/2021   Chronic low back pain 07/29/2013   Chronic pain 09/05/2017   Degenerative cervical spinal stenosis 05/26/2014   Degenerative lumbar spinal stenosis 09/22/2019   Displacement of cervical intervertebral disc 07/19/2014   Edema 01/10/2021   Elevated troponin 09/05/2017   Empyema lung ([place]) 09/09/2017   Enlarged prostate 01/10/2021   Essential hypertension 08/29/2010   Qualifier: Diagnosis of  By: Elsworth Soho MD, Leanna Sato     Fall in home 07/10/2017   Hardening of  the aorta (main artery of the heart) ([place]) 01/10/2021   History of kidney stones    Hyperlipidemia 06/21/2014   Hypertension    has a histroy of   Hypoalbuminemia 01/10/2021   Hyponatremia 09/05/2017   Hypotension    Impaired fasting glucose 01/10/2021   Intermittent tremor 01/10/2021   Iron deficiency anemia 01/10/2021   Kidney stone 01/10/2021   LBBB (left bundle branch block) 06/21/2014   Lumbar post-laminectomy syndrome 07/15/2013   Lumbar radiculopathy 07/29/2013   Lumbar stenosis with neurogenic claudication 08/31/2019   Malnutrition of moderate degree 03/23/2019   Mitral valve disorder 01/10/2021   Murmur 01/08/2017   Muscle pain 03/01/2020   Myocardial infarction ([place]) 03/21/2019   Neck pain 03/03/2014   Normochromic normocytic anemia 09/05/2017   NSTEMI (non-ST elevated myocardial infarction) ([place]) 03/21/2019   Obesity 04/06/2019   Obstructive sleep apnea 09/30/2010   PSG (180 lbs ) >> severe obstructive sleep apnea with AHi 38/h & desatns to 82%  Started on auto 5-15  >> changed to 8 cm Download reviewed 1/21 - 11/23/10 >> residual events of 6/h , avg pr 8 cm, leak + with nasal pillows     Occasional tremors 07/26/2020   Old myocardial infarction 01/10/2021   Osteoarthrosis 01/10/2021   Osteopenia 01/10/2021   Other specified disorders of kidney and ureter 01/10/2021   Parkinson's disease ([place])    Peripheral edema 01/10/2021  Peripheral neuralgia 01/10/2021   Personal history of colonic polyps 01/10/2021   Personal history of other diseases of the respiratory system 01/10/2021   Pleural effusion on left 08/19/2017   Acute symptoms since 08/13/17 > CTa  08/13/17  neg pe/ extensively loculated effusion - L thoracentesis 08/20/2017 :  220 cc with glucose < 20 and WBC 5,338 mostly Poly, LDH 688 > cyt neg >Referrd to T surgery > VATS 09/09/17 c/w empyema   Pleural effusion, left 09/04/2017   Preoperative cardiovascular examination 06/21/2014   Protein-calorie malnutrition, severe 09/11/2017   Pure  hypercholesterolemia 01/10/2021   Sciatica 01/10/2021   Scoliosis concern 08/31/2019   Sensorineural hearing loss (SNHL) of both ears 06/15/2020   Last Assessment & Plan:  Formatting of this note might be different from the original. Concern over worsening hearing. Chronic history of sensorineural hearing loss bilaterally.  Has hearing aids currently.  He feels his hearing aids are not working as well as they historically have.  Recently had them checked. EXAM shows normal external canals and tympanic membranes bilaterally. AUDIOGRAM Shows B   Shoulder joint pain 12/01/2013   Sinusitis, chronic 08/19/2017   CT sinus  08/20/2017 >>> Normally aerated paranasal sinuses.  Patent sinus drainage pathways.   SIRS (systemic inflammatory response syndrome) ([place]) 09/05/2017   Skin ulcer of toe of left foot, limited to breakdown of skin ([place]) 09/04/2016   Sleep apnea 2012   used CPAP 2 yrs. ago, feels he sleeps better w/o, no longer using    Spinal stenosis of lumbar region 08/10/2013   Spinal stenosis, lumbar region without neurogenic claudication 08/19/2019   Symptomatic anemia 09/20/2017   Thrombocytopenia ([place]) 01/10/2021   Tubular adenoma of colon 01/10/2021   Unspecified abnormal finding in specimens from other organs, systems and tissues 01/10/2021    Past Surgical History:  Procedure Laterality Date   ANTERIOR LAT LUMBAR FUSION N/A 09/22/2019   Procedure: Lumbar one-two, Lumbar two-three Anterolateral lumbar interbody fusion with pedicle screw fixation and exploration of adjacent fusion;  Surgeon: Erline Levine, MD;  Location: [facility];  Service: Neurosurgery;  Laterality: N/A;   ARM NEUROPLASTY     at 14 yrs. of age- fell off tractor- had fracture & repair *& later- 1990's had  transplantation of a nerve at the elbow   BACK SURGERY     x4 back surgery x2 fusion -   CARDIAC CATHETERIZATION  03/23/2019   CARPAL TUNNEL RELEASE Right    COLONOSCOPY     ESOPHAGOGASTRODUODENOSCOPY (EGD) WITH PROPOFOL N/A  09/23/2017   Procedure: ESOPHAGOGASTRODUODENOSCOPY (EGD) WITH PROPOFOL;  Surgeon: Laurence Spates, MD;  Location: [facility];  Service: Endoscopy;  Laterality: N/A;   LEFT HEART CATH AND CORONARY ANGIOGRAPHY N/A 03/23/2019   Procedure: LEFT HEART CATH AND CORONARY ANGIOGRAPHY;  Surgeon: Burnell Blanks, MD;  Location: [facility];  Service: Cardiovascular;  Laterality: N/A;   LUMBAR PERCUTANEOUS PEDICLE SCREW 2 LEVEL N/A 09/22/2019   Procedure: LUMBAR PERCUTANEOUS PEDICLE SCREW TWO LEVEL;  Surgeon: Erline Levine, MD;  Location: [facility];  Service: Neurosurgery;  Laterality: N/A;   PLEURAL EFFUSION DRAINAGE Left 09/09/2017   Procedure: DRAINAGE OF PLEURAL EFFUSION;  Surgeon: [name], MD;  Location: [facility];  Service: Thoracic;  Laterality: Left;   POSTERIOR CERVICAL FUSION/FORAMINOTOMY N/A 06/24/2014   Procedure: Posterior Cervical Three-Seven Fusion with Lateral Mass Fixation;  Surgeon: Erline Levine, MD;  Location: [facility];  Service: Neurosurgery;  Laterality: N/A;  C3-C7 posterior cervical fusion with lateral mass fixation  SHOULDER SURGERY Bilateral    VIDEO ASSISTED THORACOSCOPY (VATS)/DECORTICATION Left 09/09/2017   Procedure: VIDEO ASSISTED THORACOSCOPY (VATS)/DECORTICATION;  Surgeon: [name], MD;  Location: [facility];  Service: Thoracic;  Laterality: Left;   VIDEO BRONCHOSCOPY  09/09/2017   Procedure: VIDEO BRONCHOSCOPY;  Surgeon: [name], MD;  Location: [facility];  Service: Thoracic;;    There were no vitals filed for this visit.   Subjective Assessment - 04/17/21 1707     Subjective Pt. reports he did his exercises this morning, but they made his pain worse.  He went from 1/10 waking up to 4/10 after exercises.  He reports he can hardly do any exercises because it makes him hurt "no bike, no stretches, no stretchy things."    Pertinent History Parkinson's disease, frequent falls, chronic LBP, history of multiple back/neck surgeries and  fusions.    Limitations Sitting;Reading    How long can you sit comfortably? 1 hour    How long can you stand comfortably? 5 minutes    How long can you walk comfortably? 600 steps 2-3x/day    Diagnostic tests Chest Xray 08/17/20 - Musculoskeletal: A chronic posterolateral tenth right rib fracture  is seen. Additional chronic deformity involving the first left rib  is noted.    Patient Stated Goals to get rid of pain so can do more during the day    Currently in Pain? Yes    Pain Score 4     Pain Location Back    Pain Orientation Left;Upper    Pain Onset Other (comment)   year ago                              [facility] Adult PT Treatment/Exercise - 04/17/21 0001       Exercises   Exercises Other Exercises    Other Exercises  upper back stretches starting with foam roller, poorly tolerated, tolerated rolled towel much better.  seated upper trunk rotation - very little movement.  Shouler rolls- instructed to perform retro not foward.  Rhomboid stretch.   Attempted UBE for warm-up but reports ROM limitations in L elbow so unable.  All movements very slow and guarded, VC and demo throughout needed     Modalities   Modalities Ultrasound      Ultrasound   Ultrasound Location L side thoracic paraspinals T10-12    Ultrasound Parameters 3.3 mHz, 1 w/cm2, 50%    Ultrasound Goals Pain      Manual Therapy   Manual Therapy Soft tissue mobilization    Manual therapy comments trigger point release to L thoracic paraspinals                    PT Education - 04/17/21 1718     Education Details continue to perform HEP as tolerated, try shorter sets if too fatiguing, roll shoulders backwards not fowards    Person(s) Educated Patient    Methods Explanation;Demonstration    Comprehension Verbalized understanding              PT Short Term Goals - 04/14/21 1116       PT SHORT TERM GOAL #1   Title Pt. will be compliant with initial HEP for scapular  strengthening to decrease pain    Time 2    Period Weeks    Status New    Target Date 04/28/21  PT Long Term Goals - 04/14/21 1117       PT LONG TERM GOAL #1   Title Pt. will report no more than 3/10 L thoracic pain with sitting for 2 hours to be able to go out to lunch    Baseline 4/10 at rest increases to 8/10 with prolonged sitting, stays inside all the time    Time 6    Period Weeks    Status New    Target Date 05/26/21      PT LONG TERM GOAL #2   Title Pt. will be compliant with advanced HEP for postural strengthening to improve outcomes.    Time 6    Period Weeks    Status New    Target Date 05/26/21      PT LONG TERM GOAL #3   Title Pt. will demonstrate improved functional strength as demonstrated by improved sit to stand time by 5 seconds to decrease fall risk.    Baseline 30 seconds    Time 6    Period Weeks    Status New    Target Date 05/26/21      PT LONG TERM GOAL #4   Title Pt. will report 50% improvement in thoracic pain to improve QOL    Time 6    Period Weeks    Status New    Target Date 05/26/21                   Plan - 04/17/21 1719     Clinical Impression Statement Aaron Schmidt has very poor tolerance to exercises, reporting increased pain with most movements.  He was willing to try thoracic extension stretches while seated, but continues to demonstrate significant hypomobility of thoracic spine.  He was also very limited with thoracic rotation, and resistant to trying other exercises due to pain.  He was able to tolerate trigger point release to upper back with some relief, and further pain relief with [place].  We again discussed dry needling risks and concerns.  He reported decreased pain after interventions today.  He will be seeing pain management next week and will likely get more cortisone shots to area; he has also been prescribed lidocaine patches but has not yet tried them due to concerns about side effects.  He would  benefit from skilled physical therapy to decrease pain, improve mobility and QOL.    Personal Factors and Comorbidities Age;Comorbidity 3+    Comorbidities Parkinson's, arthritis, chronic pain, multiple back surgeries/fusions, CKD, falls    Examination-Activity Limitations Sit;Stand;Bend;Lift;Sleep    Examination-Participation Restrictions Interpersonal Relationship;Community Activity    Stability/Clinical Decision Making Evolving/Moderate complexity    Rehab Potential Fair    PT Frequency 2x / week    PT Duration 6 weeks    PT Treatment/Interventions ADLs/Self Care Home Management;Cryotherapy;Electrical Stimulation;Moist Heat;Ultrasound;Gait training;Functional mobility training;Therapeutic activities;Therapeutic exercise;Balance training;Neuromuscular re-education;Patient/family education;Manual techniques;Passive range of motion;Dry needling;Taping;Spinal Manipulations;Joint Manipulations    PT Next Visit Plan review exercises for scapular strengthening, thoracic mobility, manual therapy/modalities to L thoracic spine    Consulted and Agree with Plan of Care Patient             Patient will benefit from skilled therapeutic intervention in order to improve the following deficits and impairments:  Decreased endurance, Decreased mobility, Hypomobility, Increased muscle spasms, Decreased range of motion, Decreased activity tolerance, Decreased safety awareness, Decreased strength, Impaired flexibility, Postural dysfunction, Pain  Visit Diagnosis: Pain in thoracic spine  Muscle weakness (generalized)  Other muscle spasm  Abnormal posture  Other abnormalities of gait and mobility     Problem List Patient Active Problem List   Diagnosis Date Noted   Moderate mitral regurgitation 01/12/2021   Anemia due to chronic blood loss 01/10/2021   Benign prostatic hyperplasia 01/10/2021   Benign prostatic hyperplasia with lower urinary tract symptoms 01/10/2021   Chronic kidney disease,  stage 3 unspecified ([place]) 01/10/2021   Peripheral edema 01/10/2021   Enlarged prostate 01/10/2021   Hardening of the aorta (main artery of the heart) ([place]) 01/10/2021   Hypoalbuminemia 01/10/2021   Impaired fasting glucose 01/10/2021   Iron deficiency anemia 01/10/2021   Kidney stone 01/10/2021   Mitral valve disorder 01/10/2021   Osteopenia 01/10/2021   Other specified disorders of kidney and ureter 01/10/2021   Peripheral neuralgia 01/10/2021   Sciatica 01/10/2021   Personal history of colonic polyps 01/10/2021   Personal history of other diseases of the respiratory system 01/10/2021   Pure hypercholesterolemia 01/10/2021   Thrombocytopenia ([place]) 01/10/2021   Tubular adenoma of colon 01/10/2021   Unspecified abnormal finding in specimens from other organs, systems and tissues 01/10/2021   Edema 01/10/2021   Old myocardial infarction 01/10/2021   Osteoarthrosis 01/10/2021   Intermittent tremor 01/10/2021   Arthritis    History of kidney stones    Hypertension    Parkinson's disease ([place])    Occasional tremors 07/26/2020   Chronic bilateral low back pain 07/26/2020   Sensorineural hearing loss (SNHL) of both ears 06/15/2020   Muscle pain 03/01/2020   Degenerative lumbar spinal stenosis 09/22/2019   Body mass index (BMI) 25.0-25.9, adult 08/31/2019   Scoliosis concern 08/31/2019   Lumbar stenosis with neurogenic claudication 08/31/2019   Spinal stenosis, lumbar region without neurogenic claudication 08/19/2019   Hypotension 06/23/2019   Myocarditis ([place]) 05/19/2019   CAD (coronary artery disease) 04/20/2019   Obesity 04/06/2019   Malnutrition of moderate degree 03/23/2019   NSTEMI (non-ST elevated myocardial infarction) ([place]) 03/21/2019   Myocardial infarction ([place]) 03/21/2019   Anemia 09/21/2017   Symptomatic anemia 09/20/2017   Protein-calorie malnutrition, severe 09/11/2017   Empyema lung ([place]) 09/09/2017   SIRS (systemic inflammatory response syndrome) ([place])  09/05/2017   Elevated troponin 09/05/2017   Normochromic normocytic anemia 09/05/2017   Hyponatremia 09/05/2017   Acute kidney injury superimposed on chronic kidney disease ([place]) 09/05/2017   Chronic pain 09/05/2017   Pleural effusion, left 09/04/2017   Pleural effusion on left 08/19/2017   Sinusitis, chronic 08/19/2017   Fall in home 07/10/2017   Murmur 01/08/2017   Skin ulcer of toe of left foot, limited to breakdown of skin ([place]) 09/04/2016   Displacement of cervical intervertebral disc 07/19/2014   Cervical myelopathy with cervical radiculopathy ([place]) 06/24/2014   LBBB (left bundle branch block) 06/21/2014   Hyperlipidemia 06/21/2014   Preoperative cardiovascular examination 06/21/2014   Degenerative cervical spinal stenosis 05/26/2014   Cervical spondylosis without myelopathy 05/12/2014   Neck pain 03/03/2014   Shoulder joint pain 12/01/2013   Spinal stenosis of lumbar region 08/10/2013   Chronic low back pain 07/29/2013   Lumbar radiculopathy 07/29/2013   Lumbar post-laminectomy syndrome 07/15/2013   Obstructive sleep apnea 09/30/2010   Sleep apnea 2012   Essential hypertension 08/29/2010    Rennie Natter PT, DPT 04/17/2021, 5:24 PM  [facility] [address]  [place], [place], [address] Phone: [phone]   Fax:  [phone]  Name: Aaron Schmidt MRN: [id number] Date of Birth: Sep 26, 1932

## 2021-04-19 ENCOUNTER — Ambulatory Visit: Payer: Medicare Other | Admitting: Physical Therapy

## 2021-04-19 ENCOUNTER — Other Ambulatory Visit: Payer: Self-pay

## 2021-04-19 DIAGNOSIS — M62838 Other muscle spasm: Secondary | ICD-10-CM

## 2021-04-19 DIAGNOSIS — R293 Abnormal posture: Secondary | ICD-10-CM

## 2021-04-19 DIAGNOSIS — R2689 Other abnormalities of gait and mobility: Secondary | ICD-10-CM

## 2021-04-19 DIAGNOSIS — M6281 Muscle weakness (generalized): Secondary | ICD-10-CM

## 2021-04-19 DIAGNOSIS — M546 Pain in thoracic spine: Secondary | ICD-10-CM

## 2021-04-19 NOTE — Therapy (Signed)
[facility] [address]  [place], [place], [address] Phone: [phone]   Fax:  [phone]  Physical Therapy Treatment  Patient Details  Name: Aaron Schmidt MRN: [id number] Date of Birth: 31-Oct-1932 Referring Provider (PT): Tat, Wells Guiles   Encounter Date: 04/19/2021   PT End of Session - 04/19/21 1016     Visit Number 3    Number of Visits 10    Date for PT Re-Evaluation 05/26/21    Authorization Type [employer] Medicare - has had 10 previous PT visits, so KX needed after visit 10    Progress Note Due on Visit 10    PT Start Time 1016    PT Stop Time 1101    PT Time Calculation (min) 45 min    Activity Tolerance Patient limited by pain    Behavior During Therapy [facility] for tasks assessed/performed             Past Medical History:  Diagnosis Date   Acute kidney injury superimposed on chronic kidney disease ([place]) 09/05/2017   Anemia 09/21/2017   Anemia due to chronic blood loss 01/10/2021   Arthritis    Degeneration spine & stenosis   Benign prostatic hyperplasia 01/10/2021   Benign prostatic hyperplasia with lower urinary tract symptoms 01/10/2021   Body mass index (BMI) 25.0-25.9, adult 08/31/2019   CAD (coronary artery disease) 04/20/2019   Cervical myelopathy with cervical radiculopathy ([place]) 06/24/2014   Cervical spondylosis without myelopathy 05/12/2014   Chronic bilateral low back pain 07/26/2020   Chronic kidney disease, stage 3 unspecified ([place]) 01/10/2021   Chronic low back pain 07/29/2013   Chronic pain 09/05/2017   Degenerative cervical spinal stenosis 05/26/2014   Degenerative lumbar spinal stenosis 09/22/2019   Displacement of cervical intervertebral disc 07/19/2014   Edema 01/10/2021   Elevated troponin 09/05/2017   Empyema lung ([place]) 09/09/2017   Enlarged prostate 01/10/2021   Essential hypertension 08/29/2010   Qualifier: Diagnosis of  By: Elsworth Soho MD, Leanna Sato     Fall in home 07/10/2017   Hardening of  the aorta (main artery of the heart) ([place]) 01/10/2021   History of kidney stones    Hyperlipidemia 06/21/2014   Hypertension    has a histroy of   Hypoalbuminemia 01/10/2021   Hyponatremia 09/05/2017   Hypotension    Impaired fasting glucose 01/10/2021   Intermittent tremor 01/10/2021   Iron deficiency anemia 01/10/2021   Kidney stone 01/10/2021   LBBB (left bundle branch block) 06/21/2014   Lumbar post-laminectomy syndrome 07/15/2013   Lumbar radiculopathy 07/29/2013   Lumbar stenosis with neurogenic claudication 08/31/2019   Malnutrition of moderate degree 03/23/2019   Mitral valve disorder 01/10/2021   Murmur 01/08/2017   Muscle pain 03/01/2020   Myocardial infarction ([place]) 03/21/2019   Neck pain 03/03/2014   Normochromic normocytic anemia 09/05/2017   NSTEMI (non-ST elevated myocardial infarction) ([place]) 03/21/2019   Obesity 04/06/2019   Obstructive sleep apnea 09/30/2010   PSG (180 lbs ) >> severe obstructive sleep apnea with AHi 38/h & desatns to 82%  Started on auto 5-15  >> changed to 8 cm Download reviewed 1/21 - 11/23/10 >> residual events of 6/h , avg pr 8 cm, leak + with nasal pillows     Occasional tremors 07/26/2020   Old myocardial infarction 01/10/2021   Osteoarthrosis 01/10/2021   Osteopenia 01/10/2021   Other specified disorders of kidney and ureter 01/10/2021   Parkinson's disease ([place])    Peripheral edema 01/10/2021  Peripheral neuralgia 01/10/2021   Personal history of colonic polyps 01/10/2021   Personal history of other diseases of the respiratory system 01/10/2021   Pleural effusion on left 08/19/2017   Acute symptoms since 08/13/17 > CTa  08/13/17  neg pe/ extensively loculated effusion - L thoracentesis 08/20/2017 :  220 cc with glucose < 20 and WBC 5,338 mostly Poly, LDH 688 > cyt neg >Referrd to T surgery > VATS 09/09/17 c/w empyema   Pleural effusion, left 09/04/2017   Preoperative cardiovascular examination 06/21/2014   Protein-calorie malnutrition, severe 09/11/2017   Pure  hypercholesterolemia 01/10/2021   Sciatica 01/10/2021   Scoliosis concern 08/31/2019   Sensorineural hearing loss (SNHL) of both ears 06/15/2020   Last Assessment & Plan:  Formatting of this note might be different from the original. Concern over worsening hearing. Chronic history of sensorineural hearing loss bilaterally.  Has hearing aids currently.  He feels his hearing aids are not working as well as they historically have.  Recently had them checked. EXAM shows normal external canals and tympanic membranes bilaterally. AUDIOGRAM Shows B   Shoulder joint pain 12/01/2013   Sinusitis, chronic 08/19/2017   CT sinus  08/20/2017 >>> Normally aerated paranasal sinuses.  Patent sinus drainage pathways.   SIRS (systemic inflammatory response syndrome) ([place]) 09/05/2017   Skin ulcer of toe of left foot, limited to breakdown of skin ([place]) 09/04/2016   Sleep apnea 2012   used CPAP 2 yrs. ago, feels he sleeps better w/o, no longer using    Spinal stenosis of lumbar region 08/10/2013   Spinal stenosis, lumbar region without neurogenic claudication 08/19/2019   Symptomatic anemia 09/20/2017   Thrombocytopenia ([place]) 01/10/2021   Tubular adenoma of colon 01/10/2021   Unspecified abnormal finding in specimens from other organs, systems and tissues 01/10/2021    Past Surgical History:  Procedure Laterality Date   ANTERIOR LAT LUMBAR FUSION N/A 09/22/2019   Procedure: Lumbar one-two, Lumbar two-three Anterolateral lumbar interbody fusion with pedicle screw fixation and exploration of adjacent fusion;  Surgeon: Erline Levine, MD;  Location: [facility];  Service: Neurosurgery;  Laterality: N/A;   ARM NEUROPLASTY     at 14 yrs. of age- fell off tractor- had fracture & repair *& later- 1990's had  transplantation of a nerve at the elbow   BACK SURGERY     x4 back surgery x2 fusion -   CARDIAC CATHETERIZATION  03/23/2019   CARPAL TUNNEL RELEASE Right    COLONOSCOPY     ESOPHAGOGASTRODUODENOSCOPY (EGD) WITH PROPOFOL N/A  09/23/2017   Procedure: ESOPHAGOGASTRODUODENOSCOPY (EGD) WITH PROPOFOL;  Surgeon: Laurence Spates, MD;  Location: [facility];  Service: Endoscopy;  Laterality: N/A;   LEFT HEART CATH AND CORONARY ANGIOGRAPHY N/A 03/23/2019   Procedure: LEFT HEART CATH AND CORONARY ANGIOGRAPHY;  Surgeon: Burnell Blanks, MD;  Location: [facility];  Service: Cardiovascular;  Laterality: N/A;   LUMBAR PERCUTANEOUS PEDICLE SCREW 2 LEVEL N/A 09/22/2019   Procedure: LUMBAR PERCUTANEOUS PEDICLE SCREW TWO LEVEL;  Surgeon: Erline Levine, MD;  Location: [facility];  Service: Neurosurgery;  Laterality: N/A;   PLEURAL EFFUSION DRAINAGE Left 09/09/2017   Procedure: DRAINAGE OF PLEURAL EFFUSION;  Surgeon: [name], MD;  Location: [facility];  Service: Thoracic;  Laterality: Left;   POSTERIOR CERVICAL FUSION/FORAMINOTOMY N/A 06/24/2014   Procedure: Posterior Cervical Three-Seven Fusion with Lateral Mass Fixation;  Surgeon: Erline Levine, MD;  Location: [facility];  Service: Neurosurgery;  Laterality: N/A;  C3-C7 posterior cervical fusion with lateral mass fixation  SHOULDER SURGERY Bilateral    VIDEO ASSISTED THORACOSCOPY (VATS)/DECORTICATION Left 09/09/2017   Procedure: VIDEO ASSISTED THORACOSCOPY (VATS)/DECORTICATION;  Surgeon: [name], MD;  Location: [facility];  Service: Thoracic;  Laterality: Left;   VIDEO BRONCHOSCOPY  09/09/2017   Procedure: VIDEO BRONCHOSCOPY;  Surgeon: [name], MD;  Location: [facility];  Service: Thoracic;;    There were no vitals filed for this visit.                      [place] Adult PT Treatment/Exercise - 04/19/21 1016       Exercises   Other Exercises  Pt refusing to attempt any warm-up exercises stating L UE and low back prevents him from attempting NuStep, recumbent bike or UBE      Manual Therapy   Manual Therapy Soft tissue mobilization;Myofascial release;Scapular mobilization    Manual therapy comments skilled palpation and  monitoring during DN    Soft tissue mobilization STM/DTM to L thoracic paraspinals, rhomboids, subscapularis, lower infraspintus (avoiding area where cyst present), teres group & lats    Myofascial Release manual TPR  to L thoracic paraspinals, rhomboids & subscapularis    Scapular Mobilization manual L subscpaular release              Trigger Point Dry Needling - 04/19/21 1016     Consent Given? Yes    Education Handout Provided Yes    Muscles Treated Upper Quadrant Infraspinatus;Teres major;Teres minor;Latissimus dorsi   Left   Infraspinatus Response Twitch response elicited;Palpable increased muscle length    Latissimus dorsi Response Twitch response elicited;Palpable increased muscle length    Teres major Response Twitch response elicited;Palpable increased muscle length    Teres minor Response Twitch response elicited;Palpable increased muscle length                  PT Education - 04/19/21 1025     Education Details DN rational, procedure, outcomes, potential side effects, and recommended post-treatment exercises/activity    Person(s) Educated Patient    Methods Explanation;Handout    Comprehension Verbalized understanding;Need further instruction              PT Short Term Goals - 04/19/21 1101       PT SHORT TERM GOAL #1   Title Pt. will be compliant with initial HEP for scapular strengthening to decrease pain    Status On-going    Target Date 04/28/21               PT Long Term Goals - 04/19/21 1101       PT LONG TERM GOAL #1   Title Pt. will report no more than 3/10 L thoracic pain with sitting for 2 hours to be able to go out to lunch    Baseline 4/10 at rest increases to 8/10 with prolonged sitting, stays inside all the time    Status On-going    Target Date 05/26/21      PT LONG TERM GOAL #2   Title Pt. will be compliant with advanced HEP for postural strengthening to improve outcomes.    Status On-going    Target Date 05/26/21       PT LONG TERM GOAL #3   Title Pt. will demonstrate improved functional strength as demonstrated by improved sit to stand time by 5 seconds to decrease fall risk.    Baseline 30 seconds    Status On-going    Target Date 05/26/21  PT LONG TERM GOAL #4   Title Pt. will report 50% improvement in thoracic pain to improve QOL    Status On-going    Target Date 05/26/21                   Plan - 04/19/21 1100     Clinical Impression Statement Spyridon remains resistant to most exercises, refusing to attempt any sort of warm-up and wanting only to focus on DN. After explanation of DN rational, procedures, outcomes, and potential side effects, including precautions with DN over the lung fields, patient verbalized consent to DN treatment in conjunction with manual STM/DTM and TPR to reduce ttp/muscle tension. Muscles treated with DN include L lower infraspinatus, teres group and lats with pt in supine and R side lying using parallel technique and bony backdrop to ensure safety with DN over the lung fields. DN produced normal response with good twitches elicited resulting in palpable reduction in pain/ttp and muscle tension. Pt educated to expect mild to moderate muscle soreness for up to 24-48 hrs and instructed to continue prescribed home exercise program and current activity level with pt verbalizing understanding of theses instructions. DN deferred for rhomboids, thoracic paraspinals and medial subscapularis today due to inability to safely position pt in way as to ensure safe DN over the lung fields - addressed these muscles with manual therapy and if pt able to assume prone positioning on next visit, may consider DN as indicated.    Personal Factors and Comorbidities Age;Comorbidity 3+    Comorbidities Parkinson's, arthritis, chronic pain, multiple back surgeries/fusions, CKD, falls    Examination-Activity Limitations Sit;Stand;Bend;Lift;Sleep    Examination-Participation Restrictions  Interpersonal Relationship;Community Activity    Stability/Clinical Decision Making --    Rehab Potential Fair    PT Frequency 2x / week    PT Duration 6 weeks    PT Treatment/Interventions ADLs/Self Care Home Management;Cryotherapy;Electrical Stimulation;Moist Heat;Ultrasound;Gait training;Functional mobility training;Therapeutic activities;Therapeutic exercise;Balance training;Neuromuscular re-education;Patient/family education;Manual techniques;Passive range of motion;Dry needling;Taping;Spinal Manipulations;Joint Manipulations    PT Next Visit Plan assess response to DN, review exercises for scapular strengthening, thoracic mobility, manual therapy including potential DN as indicated and modalities to L thoracic spine    Consulted and Agree with Plan of Care Patient             Patient will benefit from skilled therapeutic intervention in order to improve the following deficits and impairments:  Decreased endurance, Decreased mobility, Hypomobility, Increased muscle spasms, Decreased range of motion, Decreased activity tolerance, Decreased safety awareness, Decreased strength, Impaired flexibility, Postural dysfunction, Pain  Visit Diagnosis: Pain in thoracic spine  Muscle weakness (generalized)  Other muscle spasm  Abnormal posture  Other abnormalities of gait and mobility     Problem List Patient Active Problem List   Diagnosis Date Noted   Moderate mitral regurgitation 01/12/2021   Anemia due to chronic blood loss 01/10/2021   Benign prostatic hyperplasia 01/10/2021   Benign prostatic hyperplasia with lower urinary tract symptoms 01/10/2021   Chronic kidney disease, stage 3 unspecified ([place]) 01/10/2021   Peripheral edema 01/10/2021   Enlarged prostate 01/10/2021   Hardening of the aorta (main artery of the heart) ([place]) 01/10/2021   Hypoalbuminemia 01/10/2021   Impaired fasting glucose 01/10/2021   Iron deficiency anemia 01/10/2021   Kidney stone 01/10/2021    Mitral valve disorder 01/10/2021   Osteopenia 01/10/2021   Other specified disorders of kidney and ureter 01/10/2021   Peripheral neuralgia 01/10/2021   Sciatica 01/10/2021   Personal history of colonic  polyps 01/10/2021   Personal history of other diseases of the respiratory system 01/10/2021   Pure hypercholesterolemia 01/10/2021   Thrombocytopenia ([place]) 01/10/2021   Tubular adenoma of colon 01/10/2021   Unspecified abnormal finding in specimens from other organs, systems and tissues 01/10/2021   Edema 01/10/2021   Old myocardial infarction 01/10/2021   Osteoarthrosis 01/10/2021   Intermittent tremor 01/10/2021   Arthritis    History of kidney stones    Hypertension    Parkinson's disease ([place])    Occasional tremors 07/26/2020   Chronic bilateral low back pain 07/26/2020   Sensorineural hearing loss (SNHL) of both ears 06/15/2020   Muscle pain 03/01/2020   Degenerative lumbar spinal stenosis 09/22/2019   Body mass index (BMI) 25.0-25.9, adult 08/31/2019   Scoliosis concern 08/31/2019   Lumbar stenosis with neurogenic claudication 08/31/2019   Spinal stenosis, lumbar region without neurogenic claudication 08/19/2019   Hypotension 06/23/2019   Myocarditis ([place]) 05/19/2019   CAD (coronary artery disease) 04/20/2019   Obesity 04/06/2019   Malnutrition of moderate degree 03/23/2019   NSTEMI (non-ST elevated myocardial infarction) ([place]) 03/21/2019   Myocardial infarction ([place]) 03/21/2019   Anemia 09/21/2017   Symptomatic anemia 09/20/2017   Protein-calorie malnutrition, severe 09/11/2017   Empyema lung ([place]) 09/09/2017   SIRS (systemic inflammatory response syndrome) ([place]) 09/05/2017   Elevated troponin 09/05/2017   Normochromic normocytic anemia 09/05/2017   Hyponatremia 09/05/2017   Acute kidney injury superimposed on chronic kidney disease ([place]) 09/05/2017   Chronic pain 09/05/2017   Pleural effusion, left 09/04/2017   Pleural effusion on left 08/19/2017   Sinusitis,  chronic 08/19/2017   Fall in home 07/10/2017   Murmur 01/08/2017   Skin ulcer of toe of left foot, limited to breakdown of skin ([place]) 09/04/2016   Displacement of cervical intervertebral disc 07/19/2014   Cervical myelopathy with cervical radiculopathy ([place]) 06/24/2014   LBBB (left bundle branch block) 06/21/2014   Hyperlipidemia 06/21/2014   Preoperative cardiovascular examination 06/21/2014   Degenerative cervical spinal stenosis 05/26/2014   Cervical spondylosis without myelopathy 05/12/2014   Neck pain 03/03/2014   Shoulder joint pain 12/01/2013   Spinal stenosis of lumbar region 08/10/2013   Chronic low back pain 07/29/2013   Lumbar radiculopathy 07/29/2013   Lumbar post-laminectomy syndrome 07/15/2013   Obstructive sleep apnea 09/30/2010   Sleep apnea 2012   Essential hypertension 08/29/2010    Percival Spanish, PT, MPT 04/19/2021, 2:31 PM  [facility] [address]  [place], [place], [address] Phone: [phone]   Fax:  [phone]  Name: SNEIJDER STREY MRN: [id number] Date of Birth: 1933/08/23

## 2021-04-19 NOTE — Patient Instructions (Signed)

## 2021-04-28 ENCOUNTER — Ambulatory Visit: Payer: Medicare Other | Attending: Neurology | Admitting: Physical Therapy

## 2021-04-28 ENCOUNTER — Other Ambulatory Visit: Payer: Self-pay

## 2021-04-28 ENCOUNTER — Encounter: Payer: Self-pay | Admitting: Physical Therapy

## 2021-04-28 DIAGNOSIS — M6281 Muscle weakness (generalized): Secondary | ICD-10-CM | POA: Insufficient documentation

## 2021-04-28 DIAGNOSIS — M546 Pain in thoracic spine: Secondary | ICD-10-CM | POA: Diagnosis present

## 2021-04-28 DIAGNOSIS — R293 Abnormal posture: Secondary | ICD-10-CM | POA: Diagnosis present

## 2021-04-28 DIAGNOSIS — R2689 Other abnormalities of gait and mobility: Secondary | ICD-10-CM | POA: Insufficient documentation

## 2021-04-28 DIAGNOSIS — M62838 Other muscle spasm: Secondary | ICD-10-CM | POA: Diagnosis present

## 2021-04-28 NOTE — Patient Instructions (Signed)
    Access Code: [email or web address] URL: [email or web address] Date: 04/28/2021 Prepared by: Annie Paras  Exercises Standing Bilateral Low Shoulder Row with Anchored Resistance - 1 x daily - 7 x weekly - 2 sets - 10 reps - 3-5 sec hold Seated Shoulder Row with Anchored Resistance - 1 x daily - 7 x weekly - 2 sets - 10 reps - 3-5 hold hold Scapular Retraction with Resistance Advanced - 1 x daily - 7 x weekly - 2 sets - 10 reps - 3-5 sec hold Seated Shoulder Extension and Scapular Retraction with Resistance - 1 x daily - 7 x weekly - 2 sets - 10 reps - 3-5 sec hold

## 2021-04-28 NOTE — Therapy (Addendum)
[facility] [address]  [place], [place], [address] Phone: [phone]   Fax:  [phone]  Physical Therapy Treatment  Patient Details  Name: Aaron Schmidt MRN: [id number] Date of Birth: Oct 28, 1932 Referring Provider (PT): Tat, [place] Guiles   Encounter Date: 04/28/2021   PT End of Session - 04/28/21 1021     Visit Number 4    Number of Visits 10    Date for PT Re-Evaluation 05/26/21    Authorization Type [employer] Medicare - has had 10 previous PT visits, so KX needed after visit 10    Progress Note Due on Visit 10    PT Start Time 1021    PT Stop Time 1102    PT Time Calculation (min) 41 min    Activity Tolerance Patient limited by pain    Behavior During Therapy [facility] for tasks assessed/performed             Past Medical History:  Diagnosis Date   Acute kidney injury superimposed on chronic kidney disease ([place]) 09/05/2017   Anemia 09/21/2017   Anemia due to chronic blood loss 01/10/2021   Arthritis    Degeneration spine & stenosis   Benign prostatic hyperplasia 01/10/2021   Benign prostatic hyperplasia with lower urinary tract symptoms 01/10/2021   Body mass index (BMI) 25.0-25.9, adult 08/31/2019   CAD (coronary artery disease) 04/20/2019   Cervical myelopathy with cervical radiculopathy ([place]) 06/24/2014   Cervical spondylosis without myelopathy 05/12/2014   Chronic bilateral low back pain 07/26/2020   Chronic kidney disease, stage 3 unspecified ([place]) 01/10/2021   Chronic low back pain 07/29/2013   Chronic pain 09/05/2017   Degenerative cervical spinal stenosis 05/26/2014   Degenerative lumbar spinal stenosis 09/22/2019   Displacement of cervical intervertebral disc 07/19/2014   Edema 01/10/2021   Elevated troponin 09/05/2017   Empyema lung ([place]) 09/09/2017   Enlarged prostate 01/10/2021   Essential hypertension 08/29/2010   Qualifier: Diagnosis of  By: Elsworth Soho MD, Leanna Sato     Fall in home 07/10/2017   Hardening of  the aorta (main artery of the heart) ([place]) 01/10/2021   History of kidney stones    Hyperlipidemia 06/21/2014   Hypertension    has a histroy of   Hypoalbuminemia 01/10/2021   Hyponatremia 09/05/2017   Hypotension    Impaired fasting glucose 01/10/2021   Intermittent tremor 01/10/2021   Iron deficiency anemia 01/10/2021   Kidney stone 01/10/2021   LBBB (left bundle branch block) 06/21/2014   Lumbar post-laminectomy syndrome 07/15/2013   Lumbar radiculopathy 07/29/2013   Lumbar stenosis with neurogenic claudication 08/31/2019   Malnutrition of moderate degree 03/23/2019   Mitral valve disorder 01/10/2021   Murmur 01/08/2017   Muscle pain 03/01/2020   Myocardial infarction ([place]) 03/21/2019   Neck pain 03/03/2014   Normochromic normocytic anemia 09/05/2017   NSTEMI (non-ST elevated myocardial infarction) ([place]) 03/21/2019   Obesity 04/06/2019   Obstructive sleep apnea 09/30/2010   PSG (180 lbs ) >> severe obstructive sleep apnea with AHi 38/h & desatns to 82%  Started on auto 5-15  >> changed to 8 cm Download reviewed 1/21 - 11/23/10 >> residual events of 6/h , avg pr 8 cm, leak + with nasal pillows     Occasional tremors 07/26/2020   Old myocardial infarction 01/10/2021   Osteoarthrosis 01/10/2021   Osteopenia 01/10/2021   Other specified disorders of kidney and ureter 01/10/2021   Parkinson's disease ([place])    Peripheral edema 01/10/2021  Peripheral neuralgia 01/10/2021   Personal history of colonic polyps 01/10/2021   Personal history of other diseases of the respiratory system 01/10/2021   Pleural effusion on left 08/19/2017   Acute symptoms since 08/13/17 > CTa  08/13/17  neg pe/ extensively loculated effusion - L thoracentesis 08/20/2017 :  220 cc with glucose < 20 and WBC 5,338 mostly Poly, LDH 688 > cyt neg >Referrd to T surgery > VATS 09/09/17 c/w empyema   Pleural effusion, left 09/04/2017   Preoperative cardiovascular examination 06/21/2014   Protein-calorie malnutrition, severe 09/11/2017   Pure  hypercholesterolemia 01/10/2021   Sciatica 01/10/2021   Scoliosis concern 08/31/2019   Sensorineural hearing loss (SNHL) of both ears 06/15/2020   Last Assessment & Plan:  Formatting of this note might be different from the original. Concern over worsening hearing. Chronic history of sensorineural hearing loss bilaterally.  Has hearing aids currently.  He feels his hearing aids are not working as well as they historically have.  Recently had them checked. EXAM shows normal external canals and tympanic membranes bilaterally. AUDIOGRAM Shows B   Shoulder joint pain 12/01/2013   Sinusitis, chronic 08/19/2017   CT sinus  08/20/2017 >>> Normally aerated paranasal sinuses.  Patent sinus drainage pathways.   SIRS (systemic inflammatory response syndrome) ([place]) 09/05/2017   Skin ulcer of toe of left foot, limited to breakdown of skin ([place]) 09/04/2016   Sleep apnea 2012   used CPAP 2 yrs. ago, feels he sleeps better w/o, no longer using    Spinal stenosis of lumbar region 08/10/2013   Spinal stenosis, lumbar region without neurogenic claudication 08/19/2019   Symptomatic anemia 09/20/2017   Thrombocytopenia ([place]) 01/10/2021   Tubular adenoma of colon 01/10/2021   Unspecified abnormal finding in specimens from other organs, systems and tissues 01/10/2021    Past Surgical History:  Procedure Laterality Date   ANTERIOR LAT LUMBAR FUSION N/A 09/22/2019   Procedure: Lumbar one-two, Lumbar two-three Anterolateral lumbar interbody fusion with pedicle screw fixation and exploration of adjacent fusion;  Surgeon: Erline Levine, MD;  Location: [facility];  Service: Neurosurgery;  Laterality: N/A;   ARM NEUROPLASTY     at 14 yrs. of age- fell off tractor- had fracture & repair *& later- 1990's had  transplantation of a nerve at the elbow   BACK SURGERY     x4 back surgery x2 fusion -   CARDIAC CATHETERIZATION  03/23/2019   CARPAL TUNNEL RELEASE Right    COLONOSCOPY     ESOPHAGOGASTRODUODENOSCOPY (EGD) WITH PROPOFOL N/A  09/23/2017   Procedure: ESOPHAGOGASTRODUODENOSCOPY (EGD) WITH PROPOFOL;  Surgeon: Laurence Spates, MD;  Location: [facility];  Service: Endoscopy;  Laterality: N/A;   LEFT HEART CATH AND CORONARY ANGIOGRAPHY N/A 03/23/2019   Procedure: LEFT HEART CATH AND CORONARY ANGIOGRAPHY;  Surgeon: Burnell Blanks, MD;  Location: [facility];  Service: Cardiovascular;  Laterality: N/A;   LUMBAR PERCUTANEOUS PEDICLE SCREW 2 LEVEL N/A 09/22/2019   Procedure: LUMBAR PERCUTANEOUS PEDICLE SCREW TWO LEVEL;  Surgeon: Erline Levine, MD;  Location: [facility];  Service: Neurosurgery;  Laterality: N/A;   PLEURAL EFFUSION DRAINAGE Left 09/09/2017   Procedure: DRAINAGE OF PLEURAL EFFUSION;  Surgeon: [name], MD;  Location: [facility];  Service: Thoracic;  Laterality: Left;   POSTERIOR CERVICAL FUSION/FORAMINOTOMY N/A 06/24/2014   Procedure: Posterior Cervical Three-Seven Fusion with Lateral Mass Fixation;  Surgeon: Erline Levine, MD;  Location: [facility];  Service: Neurosurgery;  Laterality: N/A;  C3-C7 posterior cervical fusion with lateral mass fixation  SHOULDER SURGERY Bilateral    VIDEO ASSISTED THORACOSCOPY (VATS)/DECORTICATION Left 09/09/2017   Procedure: VIDEO ASSISTED THORACOSCOPY (VATS)/DECORTICATION;  Surgeon: [name], MD;  Location: [facility];  Service: Thoracic;  Laterality: Left;   VIDEO BRONCHOSCOPY  09/09/2017   Procedure: VIDEO BRONCHOSCOPY;  Surgeon: [name], MD;  Location: [facility];  Service: Thoracic;;    There were no vitals filed for this visit.   Subjective Assessment - 04/28/21 1024     Subjective Pt reports his lower back has been bothering him all week but the upper back where he was needled is doing better. He has been trying to do the HEP 1x/day but hase been lmited by lower back.    Pertinent History Parkinson's disease, frequent falls, chronic LBP, history of multiple back/neck surgeries and fusions.    Currently in Pain? Yes    Pain Score 3      Pain Location Back    Pain Orientation Left;Upper    Pain Descriptors / Indicators Aching    Pain Type Chronic pain    Pain Score 4    Pain Location Back    Pain Orientation Lower    Pain Descriptors / Indicators Spasm    Pain Type Chronic pain                               [facility] Adult PT Treatment/Exercise - 04/28/21 1021       Shoulder Exercises: Seated   Extension Both;10 reps;Strengthening;Theraband    Theraband Level (Shoulder Extension) Level 1 (Yellow)    Extension Limitations cues for upright posture & scap retraction    Retraction Both;10 reps;AROM;Strengthening    Theraband Level (Shoulder Retraction) --    Retraction Limitations scap retraction & depression    Row Both;10 reps;Strengthening;Theraband    Theraband Level (Shoulder Row) Level 1 (Yellow)    Row Limitations cues for upright posture & scap retraction      Shoulder Exercises: Stretch   Cross Chest Stretch 1 rep;30 seconds    Cross Chest Stretch Limitations L rhomboid/posterior capsule stretch      Manual Therapy   Manual Therapy Soft tissue mobilization;Myofascial release;Scapular mobilization    Manual therapy comments skilled palpation and monitoring during DN    Soft tissue mobilization STM/DTM to L thoracic paraspinals, rhomboids, subscapularis, teres group & lats    Myofascial Release manual TPR  to L thoracic paraspinals, rhomboids & subscapularis    Scapular Mobilization manual L subscpaular release              Trigger Point Dry Needling - 04/28/21 1021     Consent Given? Yes    Muscles Treated Upper Quadrant Teres major;Teres minor;Latissimus dorsi    Latissimus dorsi Response Twitch response elicited;Palpable increased muscle length    Teres major Response Twitch response elicited;Palpable increased muscle length    Teres minor Response Twitch response elicited;Palpable increased muscle length                  PT Education - 04/28/21 1058      Education Details HEP update - yellow TB rows & retraction - Access Code: [email or web address]    Person(s) Educated Patient    Methods Explanation;Demonstration;Verbal cues;Handout    Comprehension Verbalized understanding;Verbal cues required;Returned demonstration;Need further instruction              PT Short Term Goals - 04/28/21 1027       PT SHORT TERM  GOAL #1   Title Pt. will be compliant with initial HEP for scapular strengthening to decrease pain    Status Achieved   04/28/21              PT Long Term Goals - 04/19/21 1101       PT LONG TERM GOAL #1   Title Pt. will report no more than 3/10 L thoracic pain with sitting for 2 hours to be able to go out to lunch    Baseline 4/10 at rest increases to 8/10 with prolonged sitting, stays inside all the time    Status On-going    Target Date 05/26/21      PT LONG TERM GOAL #2   Title Pt. will be compliant with advanced HEP for postural strengthening to improve outcomes.    Status On-going    Target Date 05/26/21      PT LONG TERM GOAL #3   Title Pt. will demonstrate improved functional strength as demonstrated by improved sit to stand time by 5 seconds to decrease fall risk.    Baseline 30 seconds    Status On-going    Target Date 05/26/21      PT LONG TERM GOAL #4   Title Pt. will report 50% improvement in thoracic pain to improve QOL    Status On-going    Target Date 05/26/21                   Plan - 04/28/21 1245     Clinical Impression Statement Aaron Schmidt remains resistant to attempt any sort of warm-up but does report he has been trying to complete the HEP daily, noting best relief with scapular retraction and unliteral rhomboid/posterior capsule stretch. He was willing to attempt addition of yellow TB resistance to scap retraction strengthening - exercises completed in sitting during therapy session but pt inquiring about completing exercises in standing at home, therefore both options provide in HEP  instructions. Manual therapy and DN continuing to target increased muscle tension and TPs in L thoracic paraspinals and periscapular muscles. DN still deferred for rhomboids, thoracic paraspinals and medial subscapularis today due to inability to safely position pt in way as to ensure safe DN over the lung fields - addressed these muscles with manual therapy as pt did not feel he would be able to tolerate prone positioning necessary. If pt able to assume prone positioning on next visit, may consider DN for medial scapular muscles and thoracic paraspinals as indicated.    Comorbidities Parkinson's, arthritis, chronic pain, multiple back surgeries/fusions, CKD, falls    Rehab Potential Fair    PT Frequency 2x / week    PT Duration 6 weeks    PT Treatment/Interventions ADLs/Self Care Home Management;Cryotherapy;Electrical Stimulation;Moist Heat;Ultrasound;Gait training;Functional mobility training;Therapeutic activities;Therapeutic exercise;Balance training;Neuromuscular re-education;Patient/family education;Manual techniques;Passive range of motion;Dry needling;Taping;Spinal Manipulations;Joint Manipulations    PT Next Visit Plan review & progress exercises for scapular strengthening, thoracic mobility, manual therapy including potential DN as indicated and modalities to L thoracic spine    PT Home Exercise Plan Access Code: [email or web address] (8/5)    Consulted and Agree with Plan of Care Patient             Patient will benefit from skilled therapeutic intervention in order to improve the following deficits and impairments:  Decreased endurance, Decreased mobility, Hypomobility, Increased muscle spasms, Decreased range of motion, Decreased activity tolerance, Decreased safety awareness, Decreased strength, Impaired flexibility, Postural dysfunction, Pain  Visit Diagnosis: Pain in thoracic spine  Muscle weakness (generalized)  Other muscle spasm  Abnormal posture  Other abnormalities of gait and  mobility     Problem List Patient Active Problem List   Diagnosis Date Noted   Moderate mitral regurgitation 01/12/2021   Anemia due to chronic blood loss 01/10/2021   Benign prostatic hyperplasia 01/10/2021   Benign prostatic hyperplasia with lower urinary tract symptoms 01/10/2021   Chronic kidney disease, stage 3 unspecified ([place]) 01/10/2021   Peripheral edema 01/10/2021   Enlarged prostate 01/10/2021   Hardening of the aorta (main artery of the heart) ([place]) 01/10/2021   Hypoalbuminemia 01/10/2021   Impaired fasting glucose 01/10/2021   Iron deficiency anemia 01/10/2021   Kidney stone 01/10/2021   Mitral valve disorder 01/10/2021   Osteopenia 01/10/2021   Other specified disorders of kidney and ureter 01/10/2021   Peripheral neuralgia 01/10/2021   Sciatica 01/10/2021   Personal history of colonic polyps 01/10/2021   Personal history of other diseases of the respiratory system 01/10/2021   Pure hypercholesterolemia 01/10/2021   Thrombocytopenia ([place]) 01/10/2021   Tubular adenoma of colon 01/10/2021   Unspecified abnormal finding in specimens from other organs, systems and tissues 01/10/2021   Edema 01/10/2021   Old myocardial infarction 01/10/2021   Osteoarthrosis 01/10/2021   Intermittent tremor 01/10/2021   Arthritis    History of kidney stones    Hypertension    Parkinson's disease ([place])    Occasional tremors 07/26/2020   Chronic bilateral low back pain 07/26/2020   Sensorineural hearing loss (SNHL) of both ears 06/15/2020   Muscle pain 03/01/2020   Degenerative lumbar spinal stenosis 09/22/2019   Body mass index (BMI) 25.0-25.9, adult 08/31/2019   Scoliosis concern 08/31/2019   Lumbar stenosis with neurogenic claudication 08/31/2019   Spinal stenosis, lumbar region without neurogenic claudication 08/19/2019   Hypotension 06/23/2019   Myocarditis ([place]) 05/19/2019   CAD (coronary artery disease) 04/20/2019   Obesity 04/06/2019   Malnutrition of moderate degree  03/23/2019   NSTEMI (non-ST elevated myocardial infarction) ([place]) 03/21/2019   Myocardial infarction ([place]) 03/21/2019   Anemia 09/21/2017   Symptomatic anemia 09/20/2017   Protein-calorie malnutrition, severe 09/11/2017   Empyema lung ([place]) 09/09/2017   SIRS (systemic inflammatory response syndrome) ([place]) 09/05/2017   Elevated troponin 09/05/2017   Normochromic normocytic anemia 09/05/2017   Hyponatremia 09/05/2017   Acute kidney injury superimposed on chronic kidney disease ([place]) 09/05/2017   Chronic pain 09/05/2017   Pleural effusion, left 09/04/2017   Pleural effusion on left 08/19/2017   Sinusitis, chronic 08/19/2017   Fall in home 07/10/2017   Murmur 01/08/2017   Skin ulcer of toe of left foot, limited to breakdown of skin ([place]) 09/04/2016   Displacement of cervical intervertebral disc 07/19/2014   Cervical myelopathy with cervical radiculopathy ([place]) 06/24/2014   LBBB (left bundle branch block) 06/21/2014   Hyperlipidemia 06/21/2014   Preoperative cardiovascular examination 06/21/2014   Degenerative cervical spinal stenosis 05/26/2014   Cervical spondylosis without myelopathy 05/12/2014   Neck pain 03/03/2014   Shoulder joint pain 12/01/2013   Spinal stenosis of lumbar region 08/10/2013   Chronic low back pain 07/29/2013   Lumbar radiculopathy 07/29/2013   Lumbar post-laminectomy syndrome 07/15/2013   Obstructive sleep apnea 09/30/2010   Sleep apnea 2012   Essential hypertension 08/29/2010    Aaron Schmidt, PT, MPT 04/28/2021, 12:55 PM  [facility] [address]  [place], [place], [address] Phone: [phone]   Fax:  [phone]  Name: Aaron Schmidt MRN:  [id number] Date of Birth: 1933-08-07

## 2021-05-01 ENCOUNTER — Encounter: Payer: Self-pay | Admitting: Physical Therapy

## 2021-05-01 ENCOUNTER — Other Ambulatory Visit: Payer: Self-pay

## 2021-05-01 ENCOUNTER — Ambulatory Visit: Payer: Medicare Other | Admitting: Physical Therapy

## 2021-05-01 DIAGNOSIS — R293 Abnormal posture: Secondary | ICD-10-CM

## 2021-05-01 DIAGNOSIS — R2689 Other abnormalities of gait and mobility: Secondary | ICD-10-CM

## 2021-05-01 DIAGNOSIS — M546 Pain in thoracic spine: Secondary | ICD-10-CM | POA: Diagnosis not present

## 2021-05-01 DIAGNOSIS — M62838 Other muscle spasm: Secondary | ICD-10-CM

## 2021-05-01 DIAGNOSIS — M6281 Muscle weakness (generalized): Secondary | ICD-10-CM

## 2021-05-01 NOTE — Therapy (Signed)
[facility] [address]  [place], [place], [address] Phone: [phone]   Fax:  [phone]  Physical Therapy Treatment  Patient Details  Name: Aaron Schmidt MRN: [id number] Date of Birth: 03/24/1933 Referring Provider (PT): Tat, Wells Guiles   Encounter Date: 05/01/2021   PT End of Session - 05/01/21 1020     Visit Number 5    Number of Visits 10    Date for PT Re-Evaluation 05/26/21    Authorization Type [employer] Medicare - has had 10 previous PT visits, so KX needed after visit 10    Progress Note Due on Visit 10    PT Start Time 1020    PT Stop Time 1101    PT Time Calculation (min) 41 min    Activity Tolerance Patient limited by pain    Behavior During Therapy [facility] for tasks assessed/performed             Past Medical History:  Diagnosis Date   Acute kidney injury superimposed on chronic kidney disease ([place]) 09/05/2017   Anemia 09/21/2017   Anemia due to chronic blood loss 01/10/2021   Arthritis    Degeneration spine & stenosis   Benign prostatic hyperplasia 01/10/2021   Benign prostatic hyperplasia with lower urinary tract symptoms 01/10/2021   Body mass index (BMI) 25.0-25.9, adult 08/31/2019   CAD (coronary artery disease) 04/20/2019   Cervical myelopathy with cervical radiculopathy ([place]) 06/24/2014   Cervical spondylosis without myelopathy 05/12/2014   Chronic bilateral low back pain 07/26/2020   Chronic kidney disease, stage 3 unspecified ([place]) 01/10/2021   Chronic low back pain 07/29/2013   Chronic pain 09/05/2017   Degenerative cervical spinal stenosis 05/26/2014   Degenerative lumbar spinal stenosis 09/22/2019   Displacement of cervical intervertebral disc 07/19/2014   Edema 01/10/2021   Elevated troponin 09/05/2017   Empyema lung ([place]) 09/09/2017   Enlarged prostate 01/10/2021   Essential hypertension 08/29/2010   Qualifier: Diagnosis of  By: Elsworth Soho MD, Leanna Sato     Fall in home 07/10/2017   Hardening of  the aorta (main artery of the heart) ([place]) 01/10/2021   History of kidney stones    Hyperlipidemia 06/21/2014   Hypertension    has a histroy of   Hypoalbuminemia 01/10/2021   Hyponatremia 09/05/2017   Hypotension    Impaired fasting glucose 01/10/2021   Intermittent tremor 01/10/2021   Iron deficiency anemia 01/10/2021   Kidney stone 01/10/2021   LBBB (left bundle branch block) 06/21/2014   Lumbar post-laminectomy syndrome 07/15/2013   Lumbar radiculopathy 07/29/2013   Lumbar stenosis with neurogenic claudication 08/31/2019   Malnutrition of moderate degree 03/23/2019   Mitral valve disorder 01/10/2021   Murmur 01/08/2017   Muscle pain 03/01/2020   Myocardial infarction ([place]) 03/21/2019   Neck pain 03/03/2014   Normochromic normocytic anemia 09/05/2017   NSTEMI (non-ST elevated myocardial infarction) ([place]) 03/21/2019   Obesity 04/06/2019   Obstructive sleep apnea 09/30/2010   PSG (180 lbs ) >> severe obstructive sleep apnea with AHi 38/h & desatns to 82%  Started on auto 5-15  >> changed to 8 cm Download reviewed 1/21 - 11/23/10 >> residual events of 6/h , avg pr 8 cm, leak + with nasal pillows     Occasional tremors 07/26/2020   Old myocardial infarction 01/10/2021   Osteoarthrosis 01/10/2021   Osteopenia 01/10/2021   Other specified disorders of kidney and ureter 01/10/2021   Parkinson's disease ([place])    Peripheral edema 01/10/2021  Peripheral neuralgia 01/10/2021   Personal history of colonic polyps 01/10/2021   Personal history of other diseases of the respiratory system 01/10/2021   Pleural effusion on left 08/19/2017   Acute symptoms since 08/13/17 > CTa  08/13/17  neg pe/ extensively loculated effusion - L thoracentesis 08/20/2017 :  220 cc with glucose < 20 and WBC 5,338 mostly Poly, LDH 688 > cyt neg >Referrd to T surgery > VATS 09/09/17 c/w empyema   Pleural effusion, left 09/04/2017   Preoperative cardiovascular examination 06/21/2014   Protein-calorie malnutrition, severe 09/11/2017   Pure  hypercholesterolemia 01/10/2021   Sciatica 01/10/2021   Scoliosis concern 08/31/2019   Sensorineural hearing loss (SNHL) of both ears 06/15/2020   Last Assessment & Plan:  Formatting of this note might be different from the original. Concern over worsening hearing. Chronic history of sensorineural hearing loss bilaterally.  Has hearing aids currently.  He feels his hearing aids are not working as well as they historically have.  Recently had them checked. EXAM shows normal external canals and tympanic membranes bilaterally. AUDIOGRAM Shows B   Shoulder joint pain 12/01/2013   Sinusitis, chronic 08/19/2017   CT sinus  08/20/2017 >>> Normally aerated paranasal sinuses.  Patent sinus drainage pathways.   SIRS (systemic inflammatory response syndrome) ([place]) 09/05/2017   Skin ulcer of toe of left foot, limited to breakdown of skin ([place]) 09/04/2016   Sleep apnea 2012   used CPAP 2 yrs. ago, feels he sleeps better w/o, no longer using    Spinal stenosis of lumbar region 08/10/2013   Spinal stenosis, lumbar region without neurogenic claudication 08/19/2019   Symptomatic anemia 09/20/2017   Thrombocytopenia ([place]) 01/10/2021   Tubular adenoma of colon 01/10/2021   Unspecified abnormal finding in specimens from other organs, systems and tissues 01/10/2021    Past Surgical History:  Procedure Laterality Date   ANTERIOR LAT LUMBAR FUSION N/A 09/22/2019   Procedure: Lumbar one-two, Lumbar two-three Anterolateral lumbar interbody fusion with pedicle screw fixation and exploration of adjacent fusion;  Surgeon: Erline Levine, MD;  Location: [facility];  Service: Neurosurgery;  Laterality: N/A;   ARM NEUROPLASTY     at 14 yrs. of age- fell off tractor- had fracture & repair *& later- 1990's had  transplantation of a nerve at the elbow   BACK SURGERY     x4 back surgery x2 fusion -   CARDIAC CATHETERIZATION  03/23/2019   CARPAL TUNNEL RELEASE Right    COLONOSCOPY     ESOPHAGOGASTRODUODENOSCOPY (EGD) WITH PROPOFOL N/A  09/23/2017   Procedure: ESOPHAGOGASTRODUODENOSCOPY (EGD) WITH PROPOFOL;  Surgeon: Laurence Spates, MD;  Location: [facility];  Service: Endoscopy;  Laterality: N/A;   LEFT HEART CATH AND CORONARY ANGIOGRAPHY N/A 03/23/2019   Procedure: LEFT HEART CATH AND CORONARY ANGIOGRAPHY;  Surgeon: Burnell Blanks, MD;  Location: [facility];  Service: Cardiovascular;  Laterality: N/A;   LUMBAR PERCUTANEOUS PEDICLE SCREW 2 LEVEL N/A 09/22/2019   Procedure: LUMBAR PERCUTANEOUS PEDICLE SCREW TWO LEVEL;  Surgeon: Erline Levine, MD;  Location: [facility];  Service: Neurosurgery;  Laterality: N/A;   PLEURAL EFFUSION DRAINAGE Left 09/09/2017   Procedure: DRAINAGE OF PLEURAL EFFUSION;  Surgeon: [name], MD;  Location: [facility];  Service: Thoracic;  Laterality: Left;   POSTERIOR CERVICAL FUSION/FORAMINOTOMY N/A 06/24/2014   Procedure: Posterior Cervical Three-Seven Fusion with Lateral Mass Fixation;  Surgeon: Erline Levine, MD;  Location: [facility];  Service: Neurosurgery;  Laterality: N/A;  C3-C7 posterior cervical fusion with lateral mass fixation  SHOULDER SURGERY Bilateral    VIDEO ASSISTED THORACOSCOPY (VATS)/DECORTICATION Left 09/09/2017   Procedure: VIDEO ASSISTED THORACOSCOPY (VATS)/DECORTICATION;  Surgeon: [name], MD;  Location: [facility];  Service: Thoracic;  Laterality: Left;   VIDEO BRONCHOSCOPY  09/09/2017   Procedure: VIDEO BRONCHOSCOPY;  Surgeon: [name], MD;  Location: [facility];  Service: Thoracic;;    There were no vitals filed for this visit.   Subjective Assessment - 05/01/21 1025     Subjective Pt reports he tried the new TB exercises in standing at home with good tolerance.    Pertinent History Parkinson's disease, frequent falls, chronic LBP, history of multiple back/neck surgeries and fusions.    Currently in Pain? Yes    Pain Score 3     Pain Location Back    Pain Orientation Left;Upper    Pain Descriptors / Indicators Aching    Pain Score  5    Pain Location Back    Pain Orientation Lower;Left;Right   L>R   Pain Descriptors / Indicators Sharp;Jabbing    Pain Type Chronic pain                               [facility] Adult PT Treatment/Exercise - 05/01/21 1020       Manual Therapy   Manual Therapy Soft tissue mobilization;Myofascial release;Scapular mobilization    Manual therapy comments skilled palpation and monitoring during DN   pt positioned in sitting leaning foward over elevated hi/low table with pillows for comfort/positioning   Soft tissue mobilization STM/DTM to L LS, thoracic paraspinals, rhomboids, middle/lower traps, infraspinatus, subscapularis, teres group & lats    Myofascial Release manual TPR  to L LS thoracic paraspinals, rhomboids, middle/lower traps, infraspinatus & subscapularis    Scapular Mobilization manual L subscpaular release; L medial/lateral scapular glide & rotation              Trigger Point Dry Needling - 05/01/21 1020     Consent Given? Yes    Muscles Treated Upper Quadrant Rhomboids;Infraspinatus;Subscapularis;Middle trapezius;Lower trapezius   Left   Rhomboids Response Twitch response elicited;Palpable increased muscle length    Infraspinatus Response Twitch response elicited;Palpable increased muscle length   lower infraspinatus taking care to avoid cyst   Subscapularis Response Twitch response elicited;Palpable increased muscle length    Lower trapezius Response Twitch response elicited;Palpable increased muscle length    Middle trapezius Response Twitch response elicited;Palpable increased muscle length                    PT Short Term Goals - 04/28/21 1027       PT SHORT TERM GOAL #1   Title Pt. will be compliant with initial HEP for scapular strengthening to decrease pain    Status Achieved   04/28/21              PT Long Term Goals - 04/19/21 1101       PT LONG TERM GOAL #1   Title Pt. will report no more than 3/10 L thoracic pain with  sitting for 2 hours to be able to go out to lunch    Baseline 4/10 at rest increases to 8/10 with prolonged sitting, stays inside all the time    Status On-going    Target Date 05/26/21      PT LONG TERM GOAL #2   Title Pt. will be compliant with advanced HEP for postural strengthening to improve outcomes.  Status On-going    Target Date 05/26/21      PT LONG TERM GOAL #3   Title Pt. will demonstrate improved functional strength as demonstrated by improved sit to stand time by 5 seconds to decrease fall risk.    Baseline 30 seconds    Status On-going    Target Date 05/26/21      PT LONG TERM GOAL #4   Title Pt. will report 50% improvement in thoracic pain to improve QOL    Status On-going    Target Date 05/26/21                   Plan - 05/01/21 1101     Clinical Impression Statement Aaron Schmidt reports better compliance with HEP including new yellow TB resisted rows and scap retraction/extension with no issues noted. Upper back pain unchanged from last visit and pt noting lower back pain still more limiting than upper back currently. Able to successfully position pt to allow safe access for DN to medial scapular muscle today, therefore performed DN to rhomboids, middle & lower traps and subscapularis in addition to infraspinatus today - good twitch responses elicited with improved muscle tension noted following DN and MT. Encouraged pt to continue with stretches and postural strengthening exercises to promote further normalization of muscle tension.    Comorbidities Parkinson's, arthritis, chronic pain, multiple back surgeries/fusions, CKD, falls    Rehab Potential Fair    PT Frequency 2x / week    PT Duration 6 weeks    PT Treatment/Interventions ADLs/Self Care Home Management;Cryotherapy;Electrical Stimulation;Moist Heat;Ultrasound;Gait training;Functional mobility training;Therapeutic activities;Therapeutic exercise;Balance training;Neuromuscular re-education;Patient/family  education;Manual techniques;Passive range of motion;Dry needling;Taping;Spinal Manipulations;Joint Manipulations    PT Next Visit Plan review & progress exercises for scapular strengthening, thoracic mobility, manual therapy including further DN as indicated (pecs?) and modalities to L thoracic spine PRN    PT Home Exercise Plan Access Code: [email or web address] (8/5)    Consulted and Agree with Plan of Care Patient             Patient will benefit from skilled therapeutic intervention in order to improve the following deficits and impairments:  Decreased endurance, Decreased mobility, Hypomobility, Increased muscle spasms, Decreased range of motion, Decreased activity tolerance, Decreased safety awareness, Decreased strength, Impaired flexibility, Postural dysfunction, Pain  Visit Diagnosis: Pain in thoracic spine  Muscle weakness (generalized)  Other muscle spasm  Abnormal posture  Other abnormalities of gait and mobility     Problem List Patient Active Problem List   Diagnosis Date Noted   Moderate mitral regurgitation 01/12/2021   Anemia due to chronic blood loss 01/10/2021   Benign prostatic hyperplasia 01/10/2021   Benign prostatic hyperplasia with lower urinary tract symptoms 01/10/2021   Chronic kidney disease, stage 3 unspecified ([place]) 01/10/2021   Peripheral edema 01/10/2021   Enlarged prostate 01/10/2021   Hardening of the aorta (main artery of the heart) ([place]) 01/10/2021   Hypoalbuminemia 01/10/2021   Impaired fasting glucose 01/10/2021   Iron deficiency anemia 01/10/2021   Kidney stone 01/10/2021   Mitral valve disorder 01/10/2021   Osteopenia 01/10/2021   Other specified disorders of kidney and ureter 01/10/2021   Peripheral neuralgia 01/10/2021   Sciatica 01/10/2021   Personal history of colonic polyps 01/10/2021   Personal history of other diseases of the respiratory system 01/10/2021   Pure hypercholesterolemia 01/10/2021   Thrombocytopenia ([place]) 01/10/2021    Tubular adenoma of colon 01/10/2021   Unspecified abnormal finding in specimens from other organs, systems and tissues 01/10/2021  Edema 01/10/2021   Old myocardial infarction 01/10/2021   Osteoarthrosis 01/10/2021   Intermittent tremor 01/10/2021   Arthritis    History of kidney stones    Hypertension    Parkinson's disease ([place])    Occasional tremors 07/26/2020   Chronic bilateral low back pain 07/26/2020   Sensorineural hearing loss (SNHL) of both ears 06/15/2020   Muscle pain 03/01/2020   Degenerative lumbar spinal stenosis 09/22/2019   Body mass index (BMI) 25.0-25.9, adult 08/31/2019   Scoliosis concern 08/31/2019   Lumbar stenosis with neurogenic claudication 08/31/2019   Spinal stenosis, lumbar region without neurogenic claudication 08/19/2019   Hypotension 06/23/2019   Myocarditis ([place]) 05/19/2019   CAD (coronary artery disease) 04/20/2019   Obesity 04/06/2019   Malnutrition of moderate degree 03/23/2019   NSTEMI (non-ST elevated myocardial infarction) ([place]) 03/21/2019   Myocardial infarction ([place]) 03/21/2019   Anemia 09/21/2017   Symptomatic anemia 09/20/2017   Protein-calorie malnutrition, severe 09/11/2017   Empyema lung ([place]) 09/09/2017   SIRS (systemic inflammatory response syndrome) ([place]) 09/05/2017   Elevated troponin 09/05/2017   Normochromic normocytic anemia 09/05/2017   Hyponatremia 09/05/2017   Acute kidney injury superimposed on chronic kidney disease ([place]) 09/05/2017   Chronic pain 09/05/2017   Pleural effusion, left 09/04/2017   Pleural effusion on left 08/19/2017   Sinusitis, chronic 08/19/2017   Fall in home 07/10/2017   Murmur 01/08/2017   Skin ulcer of toe of left foot, limited to breakdown of skin ([place]) 09/04/2016   Displacement of cervical intervertebral disc 07/19/2014   Cervical myelopathy with cervical radiculopathy ([place]) 06/24/2014   LBBB (left bundle branch block) 06/21/2014   Hyperlipidemia 06/21/2014   Preoperative cardiovascular  examination 06/21/2014   Degenerative cervical spinal stenosis 05/26/2014   Cervical spondylosis without myelopathy 05/12/2014   Neck pain 03/03/2014   Shoulder joint pain 12/01/2013   Spinal stenosis of lumbar region 08/10/2013   Chronic low back pain 07/29/2013   Lumbar radiculopathy 07/29/2013   Lumbar post-laminectomy syndrome 07/15/2013   Obstructive sleep apnea 09/30/2010   Sleep apnea 2012   Essential hypertension 08/29/2010    Percival Spanish, PT, MPT 05/01/2021, 12:28 PM  [facility] [address]  [place], [place], [address] Phone: [phone]   Fax:  [phone]  Name: Aaron Schmidt MRN: [id number] Date of Birth: [birth year]

## 2021-05-11 ENCOUNTER — Other Ambulatory Visit: Payer: Self-pay

## 2021-05-11 ENCOUNTER — Ambulatory Visit: Payer: Medicare Other | Admitting: Physical Therapy

## 2021-05-11 DIAGNOSIS — R293 Abnormal posture: Secondary | ICD-10-CM

## 2021-05-11 DIAGNOSIS — M546 Pain in thoracic spine: Secondary | ICD-10-CM

## 2021-05-11 DIAGNOSIS — M62838 Other muscle spasm: Secondary | ICD-10-CM

## 2021-05-11 DIAGNOSIS — M6281 Muscle weakness (generalized): Secondary | ICD-10-CM

## 2021-05-11 DIAGNOSIS — R2689 Other abnormalities of gait and mobility: Secondary | ICD-10-CM

## 2021-05-11 NOTE — Therapy (Signed)
[facility] [address]  [place], [place], [address] Phone: [phone]   Fax:  [phone]  Physical Therapy Treatment  Patient Details  Name: Aaron Schmidt MRN: [id number] Date of Birth: 1933/09/02 Referring Provider (PT): Tat, Wells Guiles   Encounter Date: 05/11/2021   PT End of Session - 05/11/21 1016     Visit Number 6    Number of Visits 10    Date for PT Re-Evaluation 05/26/21    Authorization Type [employer] Medicare - has had 10 previous PT visits, so KX needed after visit 10    Progress Note Due on Visit 10    PT Start Time 1018    PT Stop Time 1059    PT Time Calculation (min) 41 min    Activity Tolerance Patient tolerated treatment well    Behavior During Therapy [facility] for tasks assessed/performed             Past Medical History:  Diagnosis Date   Acute kidney injury superimposed on chronic kidney disease ([place]) 09/05/2017   Anemia 09/21/2017   Anemia due to chronic blood loss 01/10/2021   Arthritis    Degeneration spine & stenosis   Benign prostatic hyperplasia 01/10/2021   Benign prostatic hyperplasia with lower urinary tract symptoms 01/10/2021   Body mass index (BMI) 25.0-25.9, adult 08/31/2019   CAD (coronary artery disease) 04/20/2019   Cervical myelopathy with cervical radiculopathy ([place]) 06/24/2014   Cervical spondylosis without myelopathy 05/12/2014   Chronic bilateral low back pain 07/26/2020   Chronic kidney disease, stage 3 unspecified ([place]) 01/10/2021   Chronic low back pain 07/29/2013   Chronic pain 09/05/2017   Degenerative cervical spinal stenosis 05/26/2014   Degenerative lumbar spinal stenosis 09/22/2019   Displacement of cervical intervertebral disc 07/19/2014   Edema 01/10/2021   Elevated troponin 09/05/2017   Empyema lung ([place]) 09/09/2017   Enlarged prostate 01/10/2021   Essential hypertension 08/29/2010   Qualifier: Diagnosis of  By: Elsworth Soho MD, Leanna Sato     Fall in home 07/10/2017    Hardening of the aorta (main artery of the heart) ([place]) 01/10/2021   History of kidney stones    Hyperlipidemia 06/21/2014   Hypertension    has a histroy of   Hypoalbuminemia 01/10/2021   Hyponatremia 09/05/2017   Hypotension    Impaired fasting glucose 01/10/2021   Intermittent tremor 01/10/2021   Iron deficiency anemia 01/10/2021   Kidney stone 01/10/2021   LBBB (left bundle branch block) 06/21/2014   Lumbar post-laminectomy syndrome 07/15/2013   Lumbar radiculopathy 07/29/2013   Lumbar stenosis with neurogenic claudication 08/31/2019   Malnutrition of moderate degree 03/23/2019   Mitral valve disorder 01/10/2021   Murmur 01/08/2017   Muscle pain 03/01/2020   Myocardial infarction ([place]) 03/21/2019   Neck pain 03/03/2014   Normochromic normocytic anemia 09/05/2017   NSTEMI (non-ST elevated myocardial infarction) ([place]) 03/21/2019   Obesity 04/06/2019   Obstructive sleep apnea 09/30/2010   PSG (180 lbs ) >> severe obstructive sleep apnea with AHi 38/h & desatns to 82%  Started on auto 5-15  >> changed to 8 cm Download reviewed 1/21 - 11/23/10 >> residual events of 6/h , avg pr 8 cm, leak + with nasal pillows     Occasional tremors 07/26/2020   Old myocardial infarction 01/10/2021   Osteoarthrosis 01/10/2021   Osteopenia 01/10/2021   Other specified disorders of kidney and ureter 01/10/2021   Parkinson's disease ([place])    Peripheral edema 01/10/2021  Peripheral neuralgia 01/10/2021   Personal history of colonic polyps 01/10/2021   Personal history of other diseases of the respiratory system 01/10/2021   Pleural effusion on left 08/19/2017   Acute symptoms since 08/13/17 > CTa  08/13/17  neg pe/ extensively loculated effusion - L thoracentesis 08/20/2017 :  220 cc with glucose < 20 and WBC 5,338 mostly Poly, LDH 688 > cyt neg >Referrd to T surgery > VATS 09/09/17 c/w empyema   Pleural effusion, left 09/04/2017   Preoperative cardiovascular examination 06/21/2014   Protein-calorie malnutrition, severe  09/11/2017   Pure hypercholesterolemia 01/10/2021   Sciatica 01/10/2021   Scoliosis concern 08/31/2019   Sensorineural hearing loss (SNHL) of both ears 06/15/2020   Last Assessment & Plan:  Formatting of this note might be different from the original. Concern over worsening hearing. Chronic history of sensorineural hearing loss bilaterally.  Has hearing aids currently.  He feels his hearing aids are not working as well as they historically have.  Recently had them checked. EXAM shows normal external canals and tympanic membranes bilaterally. AUDIOGRAM Shows B   Shoulder joint pain 12/01/2013   Sinusitis, chronic 08/19/2017   CT sinus  08/20/2017 >>> Normally aerated paranasal sinuses.  Patent sinus drainage pathways.   SIRS (systemic inflammatory response syndrome) ([place]) 09/05/2017   Skin ulcer of toe of left foot, limited to breakdown of skin ([place]) 09/04/2016   Sleep apnea 2012   used CPAP 2 yrs. ago, feels he sleeps better w/o, no longer using    Spinal stenosis of lumbar region 08/10/2013   Spinal stenosis, lumbar region without neurogenic claudication 08/19/2019   Symptomatic anemia 09/20/2017   Thrombocytopenia ([place]) 01/10/2021   Tubular adenoma of colon 01/10/2021   Unspecified abnormal finding in specimens from other organs, systems and tissues 01/10/2021    Past Surgical History:  Procedure Laterality Date   ANTERIOR LAT LUMBAR FUSION N/A 09/22/2019   Procedure: Lumbar one-two, Lumbar two-three Anterolateral lumbar interbody fusion with pedicle screw fixation and exploration of adjacent fusion;  Surgeon: Erline Levine, MD;  Location: [facility];  Service: Neurosurgery;  Laterality: N/A;   ARM NEUROPLASTY     at 14 yrs. of age- fell off tractor- had fracture & repair *& later- 1990's had  transplantation of a nerve at the elbow   BACK SURGERY     x4 back surgery x2 fusion -   CARDIAC CATHETERIZATION  03/23/2019   CARPAL TUNNEL RELEASE Right    COLONOSCOPY     ESOPHAGOGASTRODUODENOSCOPY  (EGD) WITH PROPOFOL N/A 09/23/2017   Procedure: ESOPHAGOGASTRODUODENOSCOPY (EGD) WITH PROPOFOL;  Surgeon: Laurence Spates, MD;  Location: [facility];  Service: Endoscopy;  Laterality: N/A;   LEFT HEART CATH AND CORONARY ANGIOGRAPHY N/A 03/23/2019   Procedure: LEFT HEART CATH AND CORONARY ANGIOGRAPHY;  Surgeon: Burnell Blanks, MD;  Location: [facility];  Service: Cardiovascular;  Laterality: N/A;   LUMBAR PERCUTANEOUS PEDICLE SCREW 2 LEVEL N/A 09/22/2019   Procedure: LUMBAR PERCUTANEOUS PEDICLE SCREW TWO LEVEL;  Surgeon: Erline Levine, MD;  Location: [facility];  Service: Neurosurgery;  Laterality: N/A;   PLEURAL EFFUSION DRAINAGE Left 09/09/2017   Procedure: DRAINAGE OF PLEURAL EFFUSION;  Surgeon: [name], MD;  Location: [facility];  Service: Thoracic;  Laterality: Left;   POSTERIOR CERVICAL FUSION/FORAMINOTOMY N/A 06/24/2014   Procedure: Posterior Cervical Three-Seven Fusion with Lateral Mass Fixation;  Surgeon: Erline Levine, MD;  Location: [facility];  Service: Neurosurgery;  Laterality: N/A;  C3-C7 posterior cervical fusion with lateral mass fixation  SHOULDER SURGERY Bilateral    VIDEO ASSISTED THORACOSCOPY (VATS)/DECORTICATION Left 09/09/2017   Procedure: VIDEO ASSISTED THORACOSCOPY (VATS)/DECORTICATION;  Surgeon: [name], MD;  Location: [facility];  Service: Thoracic;  Laterality: Left;   VIDEO BRONCHOSCOPY  09/09/2017   Procedure: VIDEO BRONCHOSCOPY;  Surgeon: [name], MD;  Location: [facility];  Service: Thoracic;;    There were no vitals filed for this visit.   Subjective Assessment - 05/11/21 1018     Subjective Pt reports he got "a new pair of kicks and they catch when I walk". He notes increased soreness for several days following the last DN but once this resolved, the pain has been better.    Pertinent History Parkinson's disease, frequent falls, chronic LBP, history of multiple back/neck surgeries and fusions.    How long can you sit  comfortably? 1.5 hour    How long can you stand comfortably? 5 minutes - mostly limited by chronic LBP    Diagnostic tests Chest Xray 08/17/20 - Musculoskeletal: A chronic posterolateral tenth right rib fracture  is seen. Additional chronic deformity involving the first left rib  is noted.    Patient Stated Goals to get rid of pain so can do more during the day    Currently in Pain? Yes    Pain Score 2     Pain Location Back    Pain Orientation Left;Upper    Pain Descriptors / Indicators Aching    Pain Type Chronic pain                [facility] PT Assessment - 05/11/21 1016       Transfers   Five time sit to stand comments  20.56 sec                           [facility] Adult PT Treatment/Exercise - 05/11/21 1016       Manual Therapy   Manual Therapy Soft tissue mobilization;Myofascial release;Scapular mobilization    Manual therapy comments skilled palpation and monitoring during DN   pt positioned in sitting leaning foward over elevated hi/low table with pillows for comfort/positioning   Soft tissue mobilization STM/DTM to L LS, thoracic paraspinals, rhomboids, middle/lower traps, infraspinatus, subscapularis, teres group & lats    Myofascial Release manual TPR  to L LS thoracic paraspinals, rhomboids, middle/lower traps, infraspinatus & subscapularis    Scapular Mobilization manual L subscpaular release; L medial/lateral scapular glide & rotation              Trigger Point Dry Needling - 05/11/21 1016     Consent Given? Yes    Education Handout Provided Previously provided    Muscles Treated Upper Quadrant Rhomboids;Infraspinatus;Subscapularis;Middle trapezius;Teres major;Teres minor;Latissimus dorsi   Left   Rhomboids Response Twitch response elicited;Palpable increased muscle length    Infraspinatus Response Twitch response elicited;Palpable increased muscle length   lower infraspinatus taking care to avoid cyst   Subscapularis Response Twitch response  elicited;Palpable increased muscle length    Latissimus dorsi Response Twitch response elicited;Palpable increased muscle length    Teres major Response Twitch response elicited;Palpable increased muscle length    Teres minor Response Twitch response elicited;Palpable increased muscle length    Lower trapezius Response Twitch response elicited;Palpable increased muscle length    Middle trapezius Response Twitch response elicited;Palpable increased muscle length                    PT Short Term Goals -  04/28/21 1027       PT SHORT TERM GOAL #1   Title Pt. will be compliant with initial HEP for scapular strengthening to decrease pain    Status Achieved   04/28/21              PT Long Term Goals - 05/11/21 1022       PT LONG TERM GOAL #1   Title Pt. will report no more than 3/10 L thoracic pain with sitting for 2 hours to be able to go out to lunch    Baseline 4/10 at rest increases to 8/10 with prolonged sitting, stays inside all the time    Status Partially Met   05/11/21 - pain currently 2/10 with sitting tolerance increased to 1.5 hrs   Target Date 05/26/21      PT LONG TERM GOAL #2   Title Pt. will be compliant with advanced HEP for postural strengthening to improve outcomes.    Status Partially Met   05/11/21 - Met for current HEP   Target Date 05/26/21      PT LONG TERM GOAL #3   Title Pt. will demonstrate improved functional strength as demonstrated by improved sit to stand time by 5 seconds to decrease fall risk.    Baseline 30 seconds    Status Achieved   05/11/21 - 5xSTS = 20.56 sec     PT LONG TERM GOAL #4   Title Pt. will report 50% improvement in thoracic pain to improve QOL    Status Partially Met   05/11/21 - Pt reporting 40% reduction in pain   Target Date 05/26/21                   Plan - 05/11/21 1021     Clinical Impression Statement Aaron Schmidt reports 40% improvement in pain with improved sitting tolerance up to 1.5 hrs, allowing him to  sit through a meal at the table w/o limitation due to thoracic back pain. He reports daily performance of HEP and denies need for review or progression, stating he increases the resistance by increasing the tension on the yellow theraband. He has demonstrated progress toward all LTGs with LTG #3 now met. He continues to note benefit from DN with continued areas of increased muscle tension and TTP addressed today with good twitch responses elicited. Anticipate pt should be ready to transition to HEP at end of current POC as he declines PT offers to address LBP or deficits related to Parkinson's.    Comorbidities Parkinson's, arthritis, chronic pain, multiple back surgeries/fusions, CKD, falls    Rehab Potential Fair    PT Frequency 2x / week    PT Duration 6 weeks    PT Treatment/Interventions ADLs/Self Care Home Management;Cryotherapy;Electrical Stimulation;Moist Heat;Ultrasound;Gait training;Functional mobility training;Therapeutic activities;Therapeutic exercise;Balance training;Neuromuscular re-education;Patient/family education;Manual techniques;Passive range of motion;Dry needling;Taping;Spinal Manipulations;Joint Manipulations    PT Next Visit Plan review & progress exercises for scapular strengthening, thoracic mobility, manual therapy including further DN as indicated (pecs?) and modalities to L thoracic spine PRN    PT Home Exercise Plan Access Code: [email or web address] (8/5)    Consulted and Agree with Plan of Care Patient             Patient will benefit from skilled therapeutic intervention in order to improve the following deficits and impairments:  Decreased endurance, Decreased mobility, Hypomobility, Increased muscle spasms, Decreased range of motion, Decreased activity tolerance, Decreased safety awareness, Decreased strength, Impaired flexibility, Postural dysfunction, Pain  Visit Diagnosis: Pain in  thoracic spine  Muscle weakness (generalized)  Other muscle spasm  Abnormal  posture  Other abnormalities of gait and mobility     Problem List Patient Active Problem List   Diagnosis Date Noted   Moderate mitral regurgitation 01/12/2021   Anemia due to chronic blood loss 01/10/2021   Benign prostatic hyperplasia 01/10/2021   Benign prostatic hyperplasia with lower urinary tract symptoms 01/10/2021   Chronic kidney disease, stage 3 unspecified ([place]) 01/10/2021   Peripheral edema 01/10/2021   Enlarged prostate 01/10/2021   Hardening of the aorta (main artery of the heart) ([place]) 01/10/2021   Hypoalbuminemia 01/10/2021   Impaired fasting glucose 01/10/2021   Iron deficiency anemia 01/10/2021   Kidney stone 01/10/2021   Mitral valve disorder 01/10/2021   Osteopenia 01/10/2021   Other specified disorders of kidney and ureter 01/10/2021   Peripheral neuralgia 01/10/2021   Sciatica 01/10/2021   Personal history of colonic polyps 01/10/2021   Personal history of other diseases of the respiratory system 01/10/2021   Pure hypercholesterolemia 01/10/2021   Thrombocytopenia ([place]) 01/10/2021   Tubular adenoma of colon 01/10/2021   Unspecified abnormal finding in specimens from other organs, systems and tissues 01/10/2021   Edema 01/10/2021   Old myocardial infarction 01/10/2021   Osteoarthrosis 01/10/2021   Intermittent tremor 01/10/2021   Arthritis    History of kidney stones    Hypertension    Parkinson's disease ([place])    Occasional tremors 07/26/2020   Chronic bilateral low back pain 07/26/2020   Sensorineural hearing loss (SNHL) of both ears 06/15/2020   Muscle pain 03/01/2020   Degenerative lumbar spinal stenosis 09/22/2019   Body mass index (BMI) 25.0-25.9, adult 08/31/2019   Scoliosis concern 08/31/2019   Lumbar stenosis with neurogenic claudication 08/31/2019   Spinal stenosis, lumbar region without neurogenic claudication 08/19/2019   Hypotension 06/23/2019   Myocarditis ([place]) 05/19/2019   CAD (coronary artery disease) 04/20/2019   Obesity  04/06/2019   Malnutrition of moderate degree 03/23/2019   NSTEMI (non-ST elevated myocardial infarction) ([place]) 03/21/2019   Myocardial infarction ([place]) 03/21/2019   Anemia 09/21/2017   Symptomatic anemia 09/20/2017   Protein-calorie malnutrition, severe 09/11/2017   Empyema lung ([place]) 09/09/2017   SIRS (systemic inflammatory response syndrome) ([place]) 09/05/2017   Elevated troponin 09/05/2017   Normochromic normocytic anemia 09/05/2017   Hyponatremia 09/05/2017   Acute kidney injury superimposed on chronic kidney disease ([place]) 09/05/2017   Chronic pain 09/05/2017   Pleural effusion, left 09/04/2017   Pleural effusion on left 08/19/2017   Sinusitis, chronic 08/19/2017   Fall in home 07/10/2017   Murmur 01/08/2017   Skin ulcer of toe of left foot, limited to breakdown of skin ([place]) 09/04/2016   Displacement of cervical intervertebral disc 07/19/2014   Cervical myelopathy with cervical radiculopathy ([place]) 06/24/2014   LBBB (left bundle branch block) 06/21/2014   Hyperlipidemia 06/21/2014   Preoperative cardiovascular examination 06/21/2014   Degenerative cervical spinal stenosis 05/26/2014   Cervical spondylosis without myelopathy 05/12/2014   Neck pain 03/03/2014   Shoulder joint pain 12/01/2013   Spinal stenosis of lumbar region 08/10/2013   Chronic low back pain 07/29/2013   Lumbar radiculopathy 07/29/2013   Lumbar post-laminectomy syndrome 07/15/2013   Obstructive sleep apnea 09/30/2010   Sleep apnea 2012   Essential hypertension 08/29/2010    Percival Spanish, PT, MPT 05/11/2021, 1:41 PM  [facility] [address]  [place], [place], [address] Phone: [phone]   Fax:  [phone]  Name: Aaron Schmidt MRN: [id number] Date of Birth: 1932/10/12

## 2021-05-18 ENCOUNTER — Ambulatory Visit: Payer: Medicare Other | Admitting: Physical Therapy

## 2021-05-18 ENCOUNTER — Other Ambulatory Visit: Payer: Self-pay

## 2021-05-18 ENCOUNTER — Encounter: Payer: Self-pay | Admitting: Physical Therapy

## 2021-05-18 DIAGNOSIS — R293 Abnormal posture: Secondary | ICD-10-CM

## 2021-05-18 DIAGNOSIS — M6281 Muscle weakness (generalized): Secondary | ICD-10-CM

## 2021-05-18 DIAGNOSIS — M62838 Other muscle spasm: Secondary | ICD-10-CM

## 2021-05-18 DIAGNOSIS — M546 Pain in thoracic spine: Secondary | ICD-10-CM | POA: Diagnosis not present

## 2021-05-18 DIAGNOSIS — R2689 Other abnormalities of gait and mobility: Secondary | ICD-10-CM

## 2021-05-18 NOTE — Therapy (Signed)
[facility] [address]  [place], [place], [address] Phone: [phone]   Fax:  [phone]  Physical Therapy Treatment  Patient Details  Name: Aaron Schmidt MRN: [id number] Date of Birth: March 04, 1933 Referring Provider (PT): Tat, Wells Guiles   Encounter Date: 05/18/2021   PT End of Session - 05/18/21 1022     Visit Number 7    Number of Visits 10    Date for PT Re-Evaluation 05/26/21    Authorization Type [employer] Medicare - has had 10 previous PT visits, so KX needed after visit 10    Progress Note Due on Visit 10    PT Start Time 1022    PT Stop Time 1101    PT Time Calculation (min) 39 min    Activity Tolerance Patient tolerated treatment well    Behavior During Therapy [facility] for tasks assessed/performed             Past Medical History:  Diagnosis Date   Acute kidney injury superimposed on chronic kidney disease ([place]) 09/05/2017   Anemia 09/21/2017   Anemia due to chronic blood loss 01/10/2021   Arthritis    Degeneration spine & stenosis   Benign prostatic hyperplasia 01/10/2021   Benign prostatic hyperplasia with lower urinary tract symptoms 01/10/2021   Body mass index (BMI) 25.0-25.9, adult 08/31/2019   CAD (coronary artery disease) 04/20/2019   Cervical myelopathy with cervical radiculopathy ([place]) 06/24/2014   Cervical spondylosis without myelopathy 05/12/2014   Chronic bilateral low back pain 07/26/2020   Chronic kidney disease, stage 3 unspecified ([place]) 01/10/2021   Chronic low back pain 07/29/2013   Chronic pain 09/05/2017   Degenerative cervical spinal stenosis 05/26/2014   Degenerative lumbar spinal stenosis 09/22/2019   Displacement of cervical intervertebral disc 07/19/2014   Edema 01/10/2021   Elevated troponin 09/05/2017   Empyema lung ([place]) 09/09/2017   Enlarged prostate 01/10/2021   Essential hypertension 08/29/2010   Qualifier: Diagnosis of  By: Elsworth Soho MD, Leanna Sato     Fall in home 07/10/2017    Hardening of the aorta (main artery of the heart) ([place]) 01/10/2021   History of kidney stones    Hyperlipidemia 06/21/2014   Hypertension    has a histroy of   Hypoalbuminemia 01/10/2021   Hyponatremia 09/05/2017   Hypotension    Impaired fasting glucose 01/10/2021   Intermittent tremor 01/10/2021   Iron deficiency anemia 01/10/2021   Kidney stone 01/10/2021   LBBB (left bundle branch block) 06/21/2014   Lumbar post-laminectomy syndrome 07/15/2013   Lumbar radiculopathy 07/29/2013   Lumbar stenosis with neurogenic claudication 08/31/2019   Malnutrition of moderate degree 03/23/2019   Mitral valve disorder 01/10/2021   Murmur 01/08/2017   Muscle pain 03/01/2020   Myocardial infarction ([place]) 03/21/2019   Neck pain 03/03/2014   Normochromic normocytic anemia 09/05/2017   NSTEMI (non-ST elevated myocardial infarction) ([place]) 03/21/2019   Obesity 04/06/2019   Obstructive sleep apnea 09/30/2010   PSG (180 lbs ) >> severe obstructive sleep apnea with AHi 38/h & desatns to 82%  Started on auto 5-15  >> changed to 8 cm Download reviewed 1/21 - 11/23/10 >> residual events of 6/h , avg pr 8 cm, leak + with nasal pillows     Occasional tremors 07/26/2020   Old myocardial infarction 01/10/2021   Osteoarthrosis 01/10/2021   Osteopenia 01/10/2021   Other specified disorders of kidney and ureter 01/10/2021   Parkinson's disease ([place])    Peripheral edema 01/10/2021  Peripheral neuralgia 01/10/2021   Personal history of colonic polyps 01/10/2021   Personal history of other diseases of the respiratory system 01/10/2021   Pleural effusion on left 08/19/2017   Acute symptoms since 08/13/17 > CTa  08/13/17  neg pe/ extensively loculated effusion - L thoracentesis 08/20/2017 :  220 cc with glucose < 20 and WBC 5,338 mostly Poly, LDH 688 > cyt neg >Referrd to T surgery > VATS 09/09/17 c/w empyema   Pleural effusion, left 09/04/2017   Preoperative cardiovascular examination 06/21/2014   Protein-calorie malnutrition, severe  09/11/2017   Pure hypercholesterolemia 01/10/2021   Sciatica 01/10/2021   Scoliosis concern 08/31/2019   Sensorineural hearing loss (SNHL) of both ears 06/15/2020   Last Assessment & Plan:  Formatting of this note might be different from the original. Concern over worsening hearing. Chronic history of sensorineural hearing loss bilaterally.  Has hearing aids currently.  He feels his hearing aids are not working as well as they historically have.  Recently had them checked. EXAM shows normal external canals and tympanic membranes bilaterally. AUDIOGRAM Shows B   Shoulder joint pain 12/01/2013   Sinusitis, chronic 08/19/2017   CT sinus  08/20/2017 >>> Normally aerated paranasal sinuses.  Patent sinus drainage pathways.   SIRS (systemic inflammatory response syndrome) ([place]) 09/05/2017   Skin ulcer of toe of left foot, limited to breakdown of skin ([place]) 09/04/2016   Sleep apnea 2012   used CPAP 2 yrs. ago, feels he sleeps better w/o, no longer using    Spinal stenosis of lumbar region 08/10/2013   Spinal stenosis, lumbar region without neurogenic claudication 08/19/2019   Symptomatic anemia 09/20/2017   Thrombocytopenia ([place]) 01/10/2021   Tubular adenoma of colon 01/10/2021   Unspecified abnormal finding in specimens from other organs, systems and tissues 01/10/2021    Past Surgical History:  Procedure Laterality Date   ANTERIOR LAT LUMBAR FUSION N/A 09/22/2019   Procedure: Lumbar one-two, Lumbar two-three Anterolateral lumbar interbody fusion with pedicle screw fixation and exploration of adjacent fusion;  Surgeon: Erline Levine, MD;  Location: [facility];  Service: Neurosurgery;  Laterality: N/A;   ARM NEUROPLASTY     at 14 yrs. of age- fell off tractor- had fracture & repair *& later- 1990's had  transplantation of a nerve at the elbow   BACK SURGERY     x4 back surgery x2 fusion -   CARDIAC CATHETERIZATION  03/23/2019   CARPAL TUNNEL RELEASE Right    COLONOSCOPY     ESOPHAGOGASTRODUODENOSCOPY  (EGD) WITH PROPOFOL N/A 09/23/2017   Procedure: ESOPHAGOGASTRODUODENOSCOPY (EGD) WITH PROPOFOL;  Surgeon: Laurence Spates, MD;  Location: [facility];  Service: Endoscopy;  Laterality: N/A;   LEFT HEART CATH AND CORONARY ANGIOGRAPHY N/A 03/23/2019   Procedure: LEFT HEART CATH AND CORONARY ANGIOGRAPHY;  Surgeon: Burnell Blanks, MD;  Location: [facility];  Service: Cardiovascular;  Laterality: N/A;   LUMBAR PERCUTANEOUS PEDICLE SCREW 2 LEVEL N/A 09/22/2019   Procedure: LUMBAR PERCUTANEOUS PEDICLE SCREW TWO LEVEL;  Surgeon: Erline Levine, MD;  Location: [facility];  Service: Neurosurgery;  Laterality: N/A;   PLEURAL EFFUSION DRAINAGE Left 09/09/2017   Procedure: DRAINAGE OF PLEURAL EFFUSION;  Surgeon: [name], MD;  Location: [facility];  Service: Thoracic;  Laterality: Left;   POSTERIOR CERVICAL FUSION/FORAMINOTOMY N/A 06/24/2014   Procedure: Posterior Cervical Three-Seven Fusion with Lateral Mass Fixation;  Surgeon: Erline Levine, MD;  Location: [facility];  Service: Neurosurgery;  Laterality: N/A;  C3-C7 posterior cervical fusion with lateral mass fixation  SHOULDER SURGERY Bilateral    VIDEO ASSISTED THORACOSCOPY (VATS)/DECORTICATION Left 09/09/2017   Procedure: VIDEO ASSISTED THORACOSCOPY (VATS)/DECORTICATION;  Surgeon: [name], MD;  Location: [facility];  Service: Thoracic;  Laterality: Left;   VIDEO BRONCHOSCOPY  09/09/2017   Procedure: VIDEO BRONCHOSCOPY;  Surgeon: [name], MD;  Location: [facility];  Service: Thoracic;;    There were no vitals filed for this visit.   Subjective Assessment - 05/18/21 1024     Subjective Pt reports "the hot spot on my back is getting better" - only minimal pain recently.    Pertinent History Parkinson's disease, frequent falls, chronic LBP, history of multiple back/neck surgeries and fusions.    How long can you stand comfortably? --    Diagnostic tests Chest Xray 08/17/20 - Musculoskeletal: A chronic posterolateral  tenth right rib fracture  is seen. Additional chronic deformity involving the first left rib  is noted.    Patient Stated Goals to get rid of pain so can do more during the day    Currently in Pain? Yes    Pain Score 2     Pain Location Back    Pain Orientation Left;Upper    Pain Descriptors / Indicators Aching    Pain Type Chronic pain                               [facility] Adult PT Treatment/Exercise - 05/18/21 1022       Shoulder Exercises: Seated   Horizontal ABduction Both;5 reps;Strengthening;Theraband    Theraband Level (Shoulder Horizontal ABduction) Level 1 (Yellow)    Horizontal ABduction Limitations deferred d/t limited tolerance on L    External Rotation Both;12 reps;Strengthening;Theraband    Theraband Level (Shoulder External Rotation) Level 1 (Yellow)    External Rotation Limitations cues for scap retraction      Shoulder Exercises: Standing   Extension Both;12 reps;Strengthening;Theraband    Theraband Level (Shoulder Extension) Level 2 (Red)    Extension Limitations cues for upright posture & scap retraction    Row Both;12 reps;Strengthening;Theraband    Theraband Level (Shoulder Row) Level 2 (Red)    Row Limitations cues for upright posture & scap retraction      Manual Therapy   Manual Therapy Soft tissue mobilization;Myofascial release;Scapular mobilization    Manual therapy comments skilled palpation and monitoring during DN   pt positioned in sitting leaning foward over elevated hi/low table with pillows for comfort/positioning   Soft tissue mobilization STM/DTM to L LS, thoracic paraspinals, rhomboids, middle/lower traps, infraspinatus, subscapularis, teres group & lats    Myofascial Release manual TPR  to L LS, infraspinatus & teres group    Scapular Mobilization manual L subscpaular release; L medial/lateral scapular glide & rotation              Trigger Point Dry Needling - 05/18/21 1022     Consent Given? Yes    Muscles Treated  Head and Neck Levator scapulae   Left   Muscles Treated Upper Quadrant Infraspinatus;Teres major;Teres minor   Left   Levator Scapulae Response Twitch response elicited;Palpable increased muscle length    Infraspinatus Response Twitch response elicited;Palpable increased muscle length   lower infraspinatus taking care to avoid cyst   Teres major Response Twitch response elicited;Palpable increased muscle length    Teres minor Response Twitch response elicited;Palpable increased muscle length  PT Short Term Goals - 04/28/21 1027       PT SHORT TERM GOAL #1   Title Pt. will be compliant with initial HEP for scapular strengthening to decrease pain    Status Achieved   04/28/21              PT Long Term Goals - 05/11/21 1022       PT LONG TERM GOAL #1   Title Pt. will report no more than 3/10 L thoracic pain with sitting for 2 hours to be able to go out to lunch    Baseline 4/10 at rest increases to 8/10 with prolonged sitting, stays inside all the time    Status Partially Met   05/11/21 - pain currently 2/10 with sitting tolerance increased to 1.5 hrs   Target Date 05/26/21      PT LONG TERM GOAL #2   Title Pt. will be compliant with advanced HEP for postural strengthening to improve outcomes.    Status Partially Met   05/11/21 - Met for current HEP   Target Date 05/26/21      PT LONG TERM GOAL #3   Title Pt. will demonstrate improved functional strength as demonstrated by improved sit to stand time by 5 seconds to decrease fall risk.    Baseline 30 seconds    Status Achieved   05/11/21 - 5xSTS = 20.56 sec     PT LONG TERM GOAL #4   Title Pt. will report 50% improvement in thoracic pain to improve QOL    Status Partially Met   05/11/21 - Pt reporting 40% reduction in pain   Target Date 05/26/21                   Plan - 05/18/21 1101     Clinical Impression Statement Elmar reports continued reduction in pain following initial  post-treatment soreness after DN last session. Only 3 spots TTP with taut bands or TPs identified today - left LS, infraspinatus and teres group. All addressed with MT incorporating DN with good twitch responses elicited. He is nearing the end of his current certification and continues to refuse PT interventions beyond basic exercises along with MT and DN for upper back pain, including refusing exercises and activities to address deficits related to Parkinson's disease. Thus, we discussed his progress and potential readiness for transition to his HEP as of next visit. HEP review with resistance progressed to red TB and yellow TB B shoulder ER + scap retraction added to further improve posture and scapular muscle stability. Will plan for final goal assessment and HEP review PRN next session, and anticipate transition to HEP +/- 30-day hold.    Comorbidities Parkinson's, arthritis, chronic pain, multiple back surgeries/fusions, CKD, falls    Rehab Potential Fair    PT Frequency 2x / week    PT Duration 6 weeks    PT Treatment/Interventions ADLs/Self Care Home Management;Cryotherapy;Electrical Stimulation;Moist Heat;Ultrasound;Gait training;Functional mobility training;Therapeutic activities;Therapeutic exercise;Balance training;Neuromuscular re-education;Patient/family education;Manual techniques;Passive range of motion;Dry needling;Taping;Spinal Manipulations;Joint Manipulations    PT Next Visit Plan goal assessment & FOTO; anticipate transition to HEP +/- 30-day hold    PT Home Exercise Plan Access Code: [email or web address] (8/5, updated 8/25)    Consulted and Agree with Plan of Care Patient             Patient will benefit from skilled therapeutic intervention in order to improve the following deficits and impairments:  Decreased endurance, Decreased mobility, Hypomobility, Increased muscle spasms, Decreased range  of motion, Decreased activity tolerance, Decreased safety awareness, Decreased strength, Impaired  flexibility, Postural dysfunction, Pain  Visit Diagnosis: Pain in thoracic spine  Muscle weakness (generalized)  Other muscle spasm  Abnormal posture  Other abnormalities of gait and mobility     Problem List Patient Active Problem List   Diagnosis Date Noted   Moderate mitral regurgitation 01/12/2021   Anemia due to chronic blood loss 01/10/2021   Benign prostatic hyperplasia 01/10/2021   Benign prostatic hyperplasia with lower urinary tract symptoms 01/10/2021   Chronic kidney disease, stage 3 unspecified ([place]) 01/10/2021   Peripheral edema 01/10/2021   Enlarged prostate 01/10/2021   Hardening of the aorta (main artery of the heart) ([place]) 01/10/2021   Hypoalbuminemia 01/10/2021   Impaired fasting glucose 01/10/2021   Iron deficiency anemia 01/10/2021   Kidney stone 01/10/2021   Mitral valve disorder 01/10/2021   Osteopenia 01/10/2021   Other specified disorders of kidney and ureter 01/10/2021   Peripheral neuralgia 01/10/2021   Sciatica 01/10/2021   Personal history of colonic polyps 01/10/2021   Personal history of other diseases of the respiratory system 01/10/2021   Pure hypercholesterolemia 01/10/2021   Thrombocytopenia ([place]) 01/10/2021   Tubular adenoma of colon 01/10/2021   Unspecified abnormal finding in specimens from other organs, systems and tissues 01/10/2021   Edema 01/10/2021   Old myocardial infarction 01/10/2021   Osteoarthrosis 01/10/2021   Intermittent tremor 01/10/2021   Arthritis    History of kidney stones    Hypertension    Parkinson's disease ([place])    Occasional tremors 07/26/2020   Chronic bilateral low back pain 07/26/2020   Sensorineural hearing loss (SNHL) of both ears 06/15/2020   Muscle pain 03/01/2020   Degenerative lumbar spinal stenosis 09/22/2019   Body mass index (BMI) 25.0-25.9, adult 08/31/2019   Scoliosis concern 08/31/2019   Lumbar stenosis with neurogenic claudication 08/31/2019   Spinal stenosis, lumbar region without  neurogenic claudication 08/19/2019   Hypotension 06/23/2019   Myocarditis ([place]) 05/19/2019   CAD (coronary artery disease) 04/20/2019   Obesity 04/06/2019   Malnutrition of moderate degree 03/23/2019   NSTEMI (non-ST elevated myocardial infarction) ([place]) 03/21/2019   Myocardial infarction ([place]) 03/21/2019   Anemia 09/21/2017   Symptomatic anemia 09/20/2017   Protein-calorie malnutrition, severe 09/11/2017   Empyema lung ([place]) 09/09/2017   SIRS (systemic inflammatory response syndrome) ([place]) 09/05/2017   Elevated troponin 09/05/2017   Normochromic normocytic anemia 09/05/2017   Hyponatremia 09/05/2017   Acute kidney injury superimposed on chronic kidney disease ([place]) 09/05/2017   Chronic pain 09/05/2017   Pleural effusion, left 09/04/2017   Pleural effusion on left 08/19/2017   Sinusitis, chronic 08/19/2017   Fall in home 07/10/2017   Murmur 01/08/2017   Skin ulcer of toe of left foot, limited to breakdown of skin ([place]) 09/04/2016   Displacement of cervical intervertebral disc 07/19/2014   Cervical myelopathy with cervical radiculopathy ([place]) 06/24/2014   LBBB (left bundle branch block) 06/21/2014   Hyperlipidemia 06/21/2014   Preoperative cardiovascular examination 06/21/2014   Degenerative cervical spinal stenosis 05/26/2014   Cervical spondylosis without myelopathy 05/12/2014   Neck pain 03/03/2014   Shoulder joint pain 12/01/2013   Spinal stenosis of lumbar region 08/10/2013   Chronic low back pain 07/29/2013   Lumbar radiculopathy 07/29/2013   Lumbar post-laminectomy syndrome 07/15/2013   Obstructive sleep apnea 09/30/2010   Sleep apnea 2012   Essential hypertension 08/29/2010    Percival Spanish, PT, MPT 05/18/2021, 8:06 PM  [facility] [phone]  [address]  [place], [place], [address] Phone: [phone]   Fax:  [phone]  Name: JAMARQUES PINEDO MRN: [id number] Date of Birth: [birth year]

## 2021-05-26 ENCOUNTER — Ambulatory Visit: Payer: Medicare Other | Attending: Neurology | Admitting: Physical Therapy

## 2021-05-26 ENCOUNTER — Encounter: Payer: Self-pay | Admitting: Physical Therapy

## 2021-05-26 ENCOUNTER — Other Ambulatory Visit: Payer: Self-pay

## 2021-05-26 DIAGNOSIS — M62838 Other muscle spasm: Secondary | ICD-10-CM | POA: Diagnosis present

## 2021-05-26 DIAGNOSIS — M6281 Muscle weakness (generalized): Secondary | ICD-10-CM | POA: Diagnosis present

## 2021-05-26 DIAGNOSIS — R2689 Other abnormalities of gait and mobility: Secondary | ICD-10-CM

## 2021-05-26 DIAGNOSIS — M546 Pain in thoracic spine: Secondary | ICD-10-CM | POA: Diagnosis present

## 2021-05-26 DIAGNOSIS — R293 Abnormal posture: Secondary | ICD-10-CM | POA: Diagnosis present

## 2021-05-26 NOTE — Therapy (Signed)
[facility] [address]  [place], [place], [address] Phone: [phone]   Fax:  [phone]  Physical Therapy Treatment / Discharge Summary  Patient Details  Name: Aaron Schmidt MRN: [id number] Date of Birth: [birth year] Referring Provider (PT): Alonza Bogus, MD (PD) & Judson Roch Ardis Rowan, PA-C (spine)   Encounter Date: 05/26/2021   PT End of Session - 05/26/21 1023     Visit Number 8    Number of Visits 10    Date for PT Re-Evaluation 05/26/21    Authorization Type [employer] Medicare - has had 10 previous PT visits, so KX needed after visit 10    Progress Note Due on Visit 10    PT Start Time 1023    PT Stop Time 1101    PT Time Calculation (min) 38 min    Activity Tolerance Patient tolerated treatment well    Behavior During Therapy [facility] for tasks assessed/performed             Past Medical History:  Diagnosis Date   Acute kidney injury superimposed on chronic kidney disease ([place]) 09/05/2017   Anemia 09/21/2017   Anemia due to chronic blood loss 01/10/2021   Arthritis    Degeneration spine & stenosis   Benign prostatic hyperplasia 01/10/2021   Benign prostatic hyperplasia with lower urinary tract symptoms 01/10/2021   Body mass index (BMI) 25.0-25.9, adult 08/31/2019   CAD (coronary artery disease) 04/20/2019   Cervical myelopathy with cervical radiculopathy ([place]) 06/24/2014   Cervical spondylosis without myelopathy 05/12/2014   Chronic bilateral low back pain 07/26/2020   Chronic kidney disease, stage 3 unspecified ([place]) 01/10/2021   Chronic low back pain 07/29/2013   Chronic pain 09/05/2017   Degenerative cervical spinal stenosis 05/26/2014   Degenerative lumbar spinal stenosis 09/22/2019   Displacement of cervical intervertebral disc 07/19/2014   Edema 01/10/2021   Elevated troponin 09/05/2017   Empyema lung ([place]) 09/09/2017   Enlarged prostate 01/10/2021   Essential hypertension 08/29/2010   Qualifier: Diagnosis  of  By: Elsworth Soho MD, Leanna Sato     Fall in home 07/10/2017   Hardening of the aorta (main artery of the heart) ([place]) 01/10/2021   History of kidney stones    Hyperlipidemia 06/21/2014   Hypertension    has a histroy of   Hypoalbuminemia 01/10/2021   Hyponatremia 09/05/2017   Hypotension    Impaired fasting glucose 01/10/2021   Intermittent tremor 01/10/2021   Iron deficiency anemia 01/10/2021   Kidney stone 01/10/2021   LBBB (left bundle branch block) 06/21/2014   Lumbar post-laminectomy syndrome 07/15/2013   Lumbar radiculopathy 07/29/2013   Lumbar stenosis with neurogenic claudication 08/31/2019   Malnutrition of moderate degree 03/23/2019   Mitral valve disorder 01/10/2021   Murmur 01/08/2017   Muscle pain 03/01/2020   Myocardial infarction ([place]) 03/21/2019   Neck pain 03/03/2014   Normochromic normocytic anemia 09/05/2017   NSTEMI (non-ST elevated myocardial infarction) ([place]) 03/21/2019   Obesity 04/06/2019   Obstructive sleep apnea 09/30/2010   PSG (180 lbs ) >> severe obstructive sleep apnea with AHi 38/h & desatns to 82%  Started on auto 5-15  >> changed to 8 cm Download reviewed 1/21 - 11/23/10 >> residual events of 6/h , avg pr 8 cm, leak + with nasal pillows     Occasional tremors 07/26/2020   Old myocardial infarction 01/10/2021   Osteoarthrosis 01/10/2021   Osteopenia 01/10/2021   Other specified disorders of kidney and ureter 01/10/2021  Parkinson's disease ([place])    Peripheral edema 01/10/2021   Peripheral neuralgia 01/10/2021   Personal history of colonic polyps 01/10/2021   Personal history of other diseases of the respiratory system 01/10/2021   Pleural effusion on left 08/19/2017   Acute symptoms since 08/13/17 > CTa  08/13/17  neg pe/ extensively loculated effusion - L thoracentesis 08/20/2017 :  220 cc with glucose < 20 and WBC 5,338 mostly Poly, LDH 688 > cyt neg >Referrd to T surgery > VATS 09/09/17 c/w empyema   Pleural effusion, left 09/04/2017   Preoperative cardiovascular examination  06/21/2014   Protein-calorie malnutrition, severe 09/11/2017   Pure hypercholesterolemia 01/10/2021   Sciatica 01/10/2021   Scoliosis concern 08/31/2019   Sensorineural hearing loss (SNHL) of both ears 06/15/2020   Last Assessment & Plan:  Formatting of this note might be different from the original. Concern over worsening hearing. Chronic history of sensorineural hearing loss bilaterally.  Has hearing aids currently.  He feels his hearing aids are not working as well as they historically have.  Recently had them checked. EXAM shows normal external canals and tympanic membranes bilaterally. AUDIOGRAM Shows B   Shoulder joint pain 12/01/2013   Sinusitis, chronic 08/19/2017   CT sinus  08/20/2017 >>> Normally aerated paranasal sinuses.  Patent sinus drainage pathways.   SIRS (systemic inflammatory response syndrome) ([place]) 09/05/2017   Skin ulcer of toe of left foot, limited to breakdown of skin ([place]) 09/04/2016   Sleep apnea 2012   used CPAP 2 yrs. ago, feels he sleeps better w/o, no longer using    Spinal stenosis of lumbar region 08/10/2013   Spinal stenosis, lumbar region without neurogenic claudication 08/19/2019   Symptomatic anemia 09/20/2017   Thrombocytopenia ([place]) 01/10/2021   Tubular adenoma of colon 01/10/2021   Unspecified abnormal finding in specimens from other organs, systems and tissues 01/10/2021    Past Surgical History:  Procedure Laterality Date   ANTERIOR LAT LUMBAR FUSION N/A 09/22/2019   Procedure: Lumbar one-two, Lumbar two-three Anterolateral lumbar interbody fusion with pedicle screw fixation and exploration of adjacent fusion;  Surgeon: Erline Levine, MD;  Location: [facility];  Service: Neurosurgery;  Laterality: N/A;   ARM NEUROPLASTY     at 14 yrs. of age- fell off tractor- had fracture & repair *& later- 1990's had  transplantation of a nerve at the elbow   BACK SURGERY     x4 back surgery x2 fusion -   CARDIAC CATHETERIZATION  03/23/2019   CARPAL TUNNEL RELEASE Right     COLONOSCOPY     ESOPHAGOGASTRODUODENOSCOPY (EGD) WITH PROPOFOL N/A 09/23/2017   Procedure: ESOPHAGOGASTRODUODENOSCOPY (EGD) WITH PROPOFOL;  Surgeon: Laurence Spates, MD;  Location: [facility];  Service: Endoscopy;  Laterality: N/A;   LEFT HEART CATH AND CORONARY ANGIOGRAPHY N/A 03/23/2019   Procedure: LEFT HEART CATH AND CORONARY ANGIOGRAPHY;  Surgeon: Burnell Blanks, MD;  Location: [facility];  Service: Cardiovascular;  Laterality: N/A;   LUMBAR PERCUTANEOUS PEDICLE SCREW 2 LEVEL N/A 09/22/2019   Procedure: LUMBAR PERCUTANEOUS PEDICLE SCREW TWO LEVEL;  Surgeon: Erline Levine, MD;  Location: [facility];  Service: Neurosurgery;  Laterality: N/A;   PLEURAL EFFUSION DRAINAGE Left 09/09/2017   Procedure: DRAINAGE OF PLEURAL EFFUSION;  Surgeon: [name], MD;  Location: [facility];  Service: Thoracic;  Laterality: Left;   POSTERIOR CERVICAL FUSION/FORAMINOTOMY N/A 06/24/2014   Procedure: Posterior Cervical Three-Seven Fusion with Lateral Mass Fixation;  Surgeon: Erline Levine, MD;  Location: [facility];  Service: Neurosurgery;  Laterality:  N/A;  C3-C7 posterior cervical fusion with lateral mass fixation   SHOULDER SURGERY Bilateral    VIDEO ASSISTED THORACOSCOPY (VATS)/DECORTICATION Left 09/09/2017   Procedure: VIDEO ASSISTED THORACOSCOPY (VATS)/DECORTICATION;  Surgeon: [name], MD;  Location: [facility];  Service: Thoracic;  Laterality: Left;   VIDEO BRONCHOSCOPY  09/09/2017   Procedure: VIDEO BRONCHOSCOPY;  Surgeon: [name], MD;  Location: [facility];  Service: Thoracic;;    There were no vitals filed for this visit.   Subjective Assessment - 05/26/21 1026     Subjective Pt reporting pain has remained about the same with less benefit noted from latest DN last session. He continues to decline PT to address the deficits related to his Parkinson's disease.    Pertinent History Parkinson's disease, frequent falls, chronic LBP, history of multiple back/neck  surgeries and fusions.    How long can you sit comfortably? 1.5 hour    How long can you stand comfortably? 5 minutes - mostly limited by chronic LBP    How long can you walk comfortably? 600 steps with rollator to mailbox & back 2-3x/day    Diagnostic tests Chest Xray 08/17/20 - Musculoskeletal: A chronic posterolateral tenth right rib fracture  is seen. Additional chronic deformity involving the first left rib  is noted.    Patient Stated Goals to get rid of pain so can do more during the day    Currently in Pain? Yes    Pain Score 2     Pain Location Back    Pain Orientation Left;Upper    Pain Descriptors / Indicators Dull;Aching    Pain Type Chronic pain    Pain Frequency Constant                [facility] PT Assessment - 05/26/21 1023       Assessment   Medical Diagnosis Parkinson's Disease & Lumbar post-laminectomy syndrome    Referring Provider (PT) Alonza Bogus, MD (PD) & Thane Edu, PA-C (spine)    Next MD Visit 08/07/21   Dr. Carles Collet     AROM   Right Shoulder Flexion 137 Degrees    Right Shoulder ABduction 116 Degrees    Left Shoulder Flexion 122 Degrees    Left Shoulder ABduction 116 Degrees      Strength   Right Shoulder Flexion 4+/5    Right Shoulder ABduction 4+/5    Right Shoulder Internal Rotation 4/5    Right Shoulder External Rotation 4/5    Left Shoulder Flexion 4+/5    Left Shoulder ABduction 4+/5   pain noted at "hot spot" (medial scapula)   Left Shoulder Internal Rotation 4/5    Left Shoulder External Rotation 4/5      Transfers   Five time sit to stand comments  20.56 sec   as of 05/11/21 - pt refusing to retest today                          [facility] Adult PT Treatment/Exercise - 05/26/21 1023       Self-Care   Self-Care Posture    Posture Reviewed good sitting posture ensuring hips fully back in seat, with weight on backs of thighs rather than tailbone as well as use of chair back or towel/pillow support for neutral lumbar  lordosis to promote better spinal alignment including better scapular and postural muscle engagement.      Manual Therapy   Manual Therapy Soft tissue mobilization;Myofascial release;Scapular mobilization  Soft tissue mobilization STM/DTM to L rhomboids & middle/lower traps    Myofascial Release manual TPR  to L rhomboids & middle/lower traps    Scapular Mobilization manual L subscapular release; L medial/lateral scapular glide & rotation                      PT Short Term Goals - 04/28/21 1027       PT SHORT TERM GOAL #1   Title Pt. will be compliant with initial HEP for scapular strengthening to decrease pain    Status Achieved   04/28/21              PT Long Term Goals - 05/26/21 1030       PT LONG TERM GOAL #1   Title Pt. will report no more than 3/10 L thoracic pain with sitting for 2 hours to be able to go out to lunch    Baseline 4/10 at rest increases to 8/10 with prolonged sitting, stays inside all the time    Status Partially Met   05/26/21 - pain currently 2/10 with sitting tolerance increased to 1.5 hrs - able to sit through a meal in the dining facilities at [facility] but has not tried to go out to lunch yet     PT LONG TERM GOAL #2   Title Pt. will be compliant with advanced HEP for postural strengthening to improve outcomes.    Status Achieved   05/26/21     PT LONG TERM GOAL #3   Title Pt. will demonstrate improved functional strength as demonstrated by improved sit to stand time by 5 seconds to decrease fall risk.    Baseline 30 seconds    Status Achieved   05/11/21 - 5xSTS = 20.56 sec     PT LONG TERM GOAL #4   Title Pt. will report 50% improvement in thoracic pain to improve QOL    Status Achieved   05/26/21 - Pt reporting 60% reduction in pain                  Plan - 05/26/21 1101     Clinical Impression Statement Aaron Schmidt reports a 60% improvement in upper back pain since start of PT with recent pain typically only 2/10 and sitting  tolerance improved to 1.5 hours allowing him to sit through a meal in the dining facilities at [employer]. He appears to have reached a plateau with the benefit from the DN and refuses PT attempts to further progress stretching or strengthening exercises for upper back, but is independent with his existing HEP. We have reviewed postural awareness, especially with sitting posture, to reduce muscle strain for decreased upper and lower back pain, and he acknowledges understanding. He also has repeatedly declined PT attempts to initiate exercises and activities to address deficits related to Parkinson's disease as indicated in PT referral from Dr Tat due to fear of increasing his LBP. All goals met with exception of LTG #1 only partially met with sitting tolerance limited to 1.5 hrs rather than 2 hrs but pain levels <3/10 - pt appears to have plateaued with progress on this goal. Given pt's reluctance to address deficits related to Parkinson's and current plateau with progress related to upper back pain, will proceed with D/C from PT at this time. Aaron Schmidt is aware that he may request a new referral from Dr. Carles Collet if he were to decide he would like to pursue PT for his Parkinson's disease.    Comorbidities  Parkinson's, arthritis, chronic pain, multiple back surgeries/fusions, CKD, falls    Rehab Potential Fair    PT Treatment/Interventions ADLs/Self Care Home Management;Cryotherapy;Electrical Stimulation;Moist Heat;Ultrasound;Gait training;Functional mobility training;Therapeutic activities;Therapeutic exercise;Balance training;Neuromuscular re-education;Patient/family education;Manual techniques;Passive range of motion;Dry needling;Taping;Spinal Manipulations;Joint Manipulations    PT Next Visit Plan Discharge from PT with transition to HEP    PT Home Exercise Plan Access Code: [email or web address] (8/5, updated 8/25)    Consulted and Agree with Plan of Care Patient             Patient will benefit from skilled  therapeutic intervention in order to improve the following deficits and impairments:  Decreased endurance, Decreased mobility, Hypomobility, Increased muscle spasms, Decreased range of motion, Decreased activity tolerance, Decreased safety awareness, Decreased strength, Impaired flexibility, Postural dysfunction, Pain  Visit Diagnosis: Pain in thoracic spine  Muscle weakness (generalized)  Other muscle spasm  Abnormal posture  Other abnormalities of gait and mobility     Problem List Patient Active Problem List   Diagnosis Date Noted   Moderate mitral regurgitation 01/12/2021   Anemia due to chronic blood loss 01/10/2021   Benign prostatic hyperplasia 01/10/2021   Benign prostatic hyperplasia with lower urinary tract symptoms 01/10/2021   Chronic kidney disease, stage 3 unspecified ([place]) 01/10/2021   Peripheral edema 01/10/2021   Enlarged prostate 01/10/2021   Hardening of the aorta (main artery of the heart) ([place]) 01/10/2021   Hypoalbuminemia 01/10/2021   Impaired fasting glucose 01/10/2021   Iron deficiency anemia 01/10/2021   Kidney stone 01/10/2021   Mitral valve disorder 01/10/2021   Osteopenia 01/10/2021   Other specified disorders of kidney and ureter 01/10/2021   Peripheral neuralgia 01/10/2021   Sciatica 01/10/2021   Personal history of colonic polyps 01/10/2021   Personal history of other diseases of the respiratory system 01/10/2021   Pure hypercholesterolemia 01/10/2021   Thrombocytopenia ([place]) 01/10/2021   Tubular adenoma of colon 01/10/2021   Unspecified abnormal finding in specimens from other organs, systems and tissues 01/10/2021   Edema 01/10/2021   Old myocardial infarction 01/10/2021   Osteoarthrosis 01/10/2021   Intermittent tremor 01/10/2021   Arthritis    History of kidney stones    Hypertension    Parkinson's disease ([place])    Occasional tremors 07/26/2020   Chronic bilateral low back pain 07/26/2020   Sensorineural hearing loss (SNHL) of  both ears 06/15/2020   Muscle pain 03/01/2020   Degenerative lumbar spinal stenosis 09/22/2019   Body mass index (BMI) 25.0-25.9, adult 08/31/2019   Scoliosis concern 08/31/2019   Lumbar stenosis with neurogenic claudication 08/31/2019   Spinal stenosis, lumbar region without neurogenic claudication 08/19/2019   Hypotension 06/23/2019   Myocarditis ([place]) 05/19/2019   CAD (coronary artery disease) 04/20/2019   Obesity 04/06/2019   Malnutrition of moderate degree 03/23/2019   NSTEMI (non-ST elevated myocardial infarction) ([place]) 03/21/2019   Myocardial infarction ([place]) 03/21/2019   Anemia 09/21/2017   Symptomatic anemia 09/20/2017   Protein-calorie malnutrition, severe 09/11/2017   Empyema lung ([place]) 09/09/2017   SIRS (systemic inflammatory response syndrome) ([place]) 09/05/2017   Elevated troponin 09/05/2017   Normochromic normocytic anemia 09/05/2017   Hyponatremia 09/05/2017   Acute kidney injury superimposed on chronic kidney disease ([place]) 09/05/2017   Chronic pain 09/05/2017   Pleural effusion, left 09/04/2017   Pleural effusion on left 08/19/2017   Sinusitis, chronic 08/19/2017   Fall in home 07/10/2017   Murmur 01/08/2017   Skin ulcer of toe of left foot, limited to breakdown of skin ([place]) 09/04/2016   Displacement of  cervical intervertebral disc 07/19/2014   Cervical myelopathy with cervical radiculopathy ([facility]) 06/24/2014   LBBB (left bundle branch block) 06/21/2014   Hyperlipidemia 06/21/2014   Preoperative cardiovascular examination 06/21/2014   Degenerative cervical spinal stenosis 05/26/2014   Cervical spondylosis without myelopathy 05/12/2014   Neck pain 03/03/2014   Shoulder joint pain 12/01/2013   Spinal stenosis of lumbar region 08/10/2013   Chronic low back pain 07/29/2013   Lumbar radiculopathy 07/29/2013   Lumbar post-laminectomy syndrome 07/15/2013   Obstructive sleep apnea 09/30/2010   Sleep apnea 2012   Essential hypertension 08/29/2010    PHYSICAL  THERAPY DISCHARGE SUMMARY  Visits from Start of Care: 8  Current functional level related to goals / functional outcomes:   Refer to above clinical impression.    Remaining deficits:   As above.   Education / Equipment:   HEP   Patient agrees to discharge. Patient goals were mostly met. Patient is being discharged due to  reaching a plateau with current therapy for upper back pain and patient not interested in pursuing PT for Parkinson's related deficits.   Percival Spanish, PT, MPT 05/26/2021, 1:19 PM  [facility] [address]  [place], [place], [address] Phone: [phone]   Fax:  [phone]  Name: YOSHI MANCILLAS MRN: [id number] Date of Birth: 1933-05-26

## 2021-06-19 ENCOUNTER — Other Ambulatory Visit: Payer: Self-pay | Admitting: Neurology

## 2021-07-07 ENCOUNTER — Encounter: Payer: Self-pay | Admitting: Physical Therapy

## 2021-07-07 ENCOUNTER — Ambulatory Visit: Payer: Medicare Other | Attending: Neurosurgery | Admitting: Physical Therapy

## 2021-07-07 ENCOUNTER — Other Ambulatory Visit: Payer: Self-pay

## 2021-07-07 DIAGNOSIS — R293 Abnormal posture: Secondary | ICD-10-CM | POA: Insufficient documentation

## 2021-07-07 DIAGNOSIS — M546 Pain in thoracic spine: Secondary | ICD-10-CM | POA: Insufficient documentation

## 2021-07-07 DIAGNOSIS — R2689 Other abnormalities of gait and mobility: Secondary | ICD-10-CM | POA: Insufficient documentation

## 2021-07-07 DIAGNOSIS — G8929 Other chronic pain: Secondary | ICD-10-CM | POA: Diagnosis present

## 2021-07-07 DIAGNOSIS — M6281 Muscle weakness (generalized): Secondary | ICD-10-CM | POA: Insufficient documentation

## 2021-07-07 DIAGNOSIS — M62838 Other muscle spasm: Secondary | ICD-10-CM | POA: Diagnosis present

## 2021-07-07 DIAGNOSIS — M545 Low back pain, unspecified: Secondary | ICD-10-CM | POA: Diagnosis present

## 2021-07-07 NOTE — Patient Instructions (Addendum)
    Access Code: [email or web address] URL: [email or web address] Date: 07/07/2021 Prepared by: Annie Paras  Exercises Seated Flexion Stretch with Swiss Ball - 2-3 x daily - 7 x weekly - 3 reps - 30 sec hold Seated Thoracic Flexion and Rotation with Swiss Ball - 2-3 x daily - 7 x weekly - 2 sets - 3 reps - 30 sec hold Seated Quadratus Lumborum Stretch in Chair - 2-3 x daily - 7 x weekly - 3 reps - 30 sec hold Seated Trunk Rotation Stretch - 2-3 x daily - 7 x weekly - 3 reps - 30 sec hold Seated Trunk Rotation - 1 x daily - 7 x weekly - 2 sets - 10 reps - 3 sec hold

## 2021-07-07 NOTE — Therapy (Signed)
[facility] [address]  [place], [place], [address] Phone: [phone]   Fax:  [phone]  Physical Therapy Evaluation  Patient Details  Name: Aaron Schmidt MRN: [id number] Date of Birth: 04-19-1933 Referring Provider (PT): Orma Render, MD   Encounter Date: 07/07/2021   PT End of Session - 07/07/21 0934     Visit Number 1    Number of Visits 4    Date for PT Re-Evaluation 08/11/21    Authorization Type UHC Medicare    PT Start Time 0934    PT Stop Time 1024    PT Time Calculation (min) 50 min    Activity Tolerance Patient tolerated treatment well;Patient limited by pain    Behavior During Therapy [facility] for tasks assessed/performed             Past Medical History:  Diagnosis Date   Acute kidney injury superimposed on chronic kidney disease ([place]) 09/05/2017   Anemia 09/21/2017   Anemia due to chronic blood loss 01/10/2021   Arthritis    Degeneration spine & stenosis   Benign prostatic hyperplasia 01/10/2021   Benign prostatic hyperplasia with lower urinary tract symptoms 01/10/2021   Body mass index (BMI) 25.0-25.9, adult 08/31/2019   CAD (coronary artery disease) 04/20/2019   Cervical myelopathy with cervical radiculopathy ([place]) 06/24/2014   Cervical spondylosis without myelopathy 05/12/2014   Chronic bilateral low back pain 07/26/2020   Chronic kidney disease, stage 3 unspecified ([place]) 01/10/2021   Chronic low back pain 07/29/2013   Chronic pain 09/05/2017   Degenerative cervical spinal stenosis 05/26/2014   Degenerative lumbar spinal stenosis 09/22/2019   Displacement of cervical intervertebral disc 07/19/2014   Edema 01/10/2021   Elevated troponin 09/05/2017   Empyema lung ([place]) 09/09/2017   Enlarged prostate 01/10/2021   Essential hypertension 08/29/2010   Qualifier: Diagnosis of  By: Elsworth Soho MD, Leanna Sato     Fall in home 07/10/2017   Hardening of the aorta (main artery of the heart) ([place]) 01/10/2021    History of kidney stones    Hyperlipidemia 06/21/2014   Hypertension    has a histroy of   Hypoalbuminemia 01/10/2021   Hyponatremia 09/05/2017   Hypotension    Impaired fasting glucose 01/10/2021   Intermittent tremor 01/10/2021   Iron deficiency anemia 01/10/2021   Kidney stone 01/10/2021   LBBB (left bundle branch block) 06/21/2014   Lumbar post-laminectomy syndrome 07/15/2013   Lumbar radiculopathy 07/29/2013   Lumbar stenosis with neurogenic claudication 08/31/2019   Malnutrition of moderate degree 03/23/2019   Mitral valve disorder 01/10/2021   Murmur 01/08/2017   Muscle pain 03/01/2020   Myocardial infarction ([place]) 03/21/2019   Neck pain 03/03/2014   Normochromic normocytic anemia 09/05/2017   NSTEMI (non-ST elevated myocardial infarction) ([place]) 03/21/2019   Obesity 04/06/2019   Obstructive sleep apnea 09/30/2010   PSG (180 lbs ) >> severe obstructive sleep apnea with AHi 38/h & desatns to 82%  Started on auto 5-15  >> changed to 8 cm Download reviewed 1/21 - 11/23/10 >> residual events of 6/h , avg pr 8 cm, leak + with nasal pillows     Occasional tremors 07/26/2020   Old myocardial infarction 01/10/2021   Osteoarthrosis 01/10/2021   Osteopenia 01/10/2021   Other specified disorders of kidney and ureter 01/10/2021   Parkinson's disease ([place])    Peripheral edema 01/10/2021   Peripheral neuralgia 01/10/2021   Personal history of colonic polyps 01/10/2021   Personal history of other diseases  of the respiratory system 01/10/2021   Pleural effusion on left 08/19/2017   Acute symptoms since 08/13/17 > CTa  08/13/17  neg pe/ extensively loculated effusion - L thoracentesis 08/20/2017 :  220 cc with glucose < 20 and WBC 5,338 mostly Poly, LDH 688 > cyt neg >Referrd to T surgery > VATS 09/09/17 c/w empyema   Pleural effusion, left 09/04/2017   Preoperative cardiovascular examination 06/21/2014   Protein-calorie malnutrition, severe 09/11/2017   Pure hypercholesterolemia 01/10/2021   Sciatica 01/10/2021    Scoliosis concern 08/31/2019   Sensorineural hearing loss (SNHL) of both ears 06/15/2020   Last Assessment & Plan:  Formatting of this note might be different from the original. Concern over worsening hearing. Chronic history of sensorineural hearing loss bilaterally.  Has hearing aids currently.  He feels his hearing aids are not working as well as they historically have.  Recently had them checked. EXAM shows normal external canals and tympanic membranes bilaterally. AUDIOGRAM Shows B   Shoulder joint pain 12/01/2013   Sinusitis, chronic 08/19/2017   CT sinus  08/20/2017 >>> Normally aerated paranasal sinuses.  Patent sinus drainage pathways.   SIRS (systemic inflammatory response syndrome) ([place]) 09/05/2017   Skin ulcer of toe of left foot, limited to breakdown of skin ([place]) 09/04/2016   Sleep apnea 2012   used CPAP 2 yrs. ago, feels he sleeps better w/o, no longer using    Spinal stenosis of lumbar region 08/10/2013   Spinal stenosis, lumbar region without neurogenic claudication 08/19/2019   Symptomatic anemia 09/20/2017   Thrombocytopenia ([place]) 01/10/2021   Tubular adenoma of colon 01/10/2021   Unspecified abnormal finding in specimens from other organs, systems and tissues 01/10/2021    Past Surgical History:  Procedure Laterality Date   ANTERIOR LAT LUMBAR FUSION N/A 09/22/2019   Procedure: Lumbar one-two, Lumbar two-three Anterolateral lumbar interbody fusion with pedicle screw fixation and exploration of adjacent fusion;  Surgeon: Erline Levine, MD;  Location: [facility];  Service: Neurosurgery;  Laterality: N/A;   ARM NEUROPLASTY     at 14 yrs. of age- fell off tractor- had fracture & repair *& later- 1990's had  transplantation of a nerve at the elbow   BACK SURGERY     x4 back surgery x2 fusion -   CARDIAC CATHETERIZATION  03/23/2019   CARPAL TUNNEL RELEASE Right    COLONOSCOPY     ESOPHAGOGASTRODUODENOSCOPY (EGD) WITH PROPOFOL N/A 09/23/2017   Procedure: ESOPHAGOGASTRODUODENOSCOPY  (EGD) WITH PROPOFOL;  Surgeon: Laurence Spates, MD;  Location: [facility];  Service: Endoscopy;  Laterality: N/A;   LEFT HEART CATH AND CORONARY ANGIOGRAPHY N/A 03/23/2019   Procedure: LEFT HEART CATH AND CORONARY ANGIOGRAPHY;  Surgeon: Burnell Blanks, MD;  Location: [facility];  Service: Cardiovascular;  Laterality: N/A;   LUMBAR PERCUTANEOUS PEDICLE SCREW 2 LEVEL N/A 09/22/2019   Procedure: LUMBAR PERCUTANEOUS PEDICLE SCREW TWO LEVEL;  Surgeon: Erline Levine, MD;  Location: [facility];  Service: Neurosurgery;  Laterality: N/A;   PLEURAL EFFUSION DRAINAGE Left 09/09/2017   Procedure: DRAINAGE OF PLEURAL EFFUSION;  Surgeon: [name], MD;  Location: [facility];  Service: Thoracic;  Laterality: Left;   POSTERIOR CERVICAL FUSION/FORAMINOTOMY N/A 06/24/2014   Procedure: Posterior Cervical Three-Seven Fusion with Lateral Mass Fixation;  Surgeon: Erline Levine, MD;  Location: [facility];  Service: Neurosurgery;  Laterality: N/A;  C3-C7 posterior cervical fusion with lateral mass fixation   SHOULDER SURGERY Bilateral    VIDEO ASSISTED THORACOSCOPY (VATS)/DECORTICATION Left 09/09/2017   Procedure: VIDEO ASSISTED  THORACOSCOPY (VATS)/DECORTICATION;  Surgeon: [name], MD;  Location: [facility];  Service: Thoracic;  Laterality: Left;   VIDEO BRONCHOSCOPY  09/09/2017   Procedure: VIDEO BRONCHOSCOPY;  Surgeon: [name], MD;  Location: [facility];  Service: Thoracic;;    There were no vitals filed for this visit.    Subjective Assessment - 07/07/21 0941     Subjective Pt reports he fell back into his recliner on 06/15/21 jarring his back and flaring up the "hot spot" in the L scapular area previously treated in PT with DN. This has susbsided some since the fall but also noting increased R sided-LBP for the past few days. Pt reports he had been completing his prior HEP daily until the R side of his back started hurting.    Pertinent History Parkinson's disease, frequent  falls, chronic LBP, history of multiple back/neck surgeries and fusions.    How long can you sit comfortably? <10 minutes    Patient Stated Goals "to get the 'hot spot' so I can stand & sit better"    Currently in Pain? Yes    Pain Score 3     Pain Location Scapula    Pain Orientation Left    Pain Descriptors / Indicators Burning    Pain Type Acute pain;Chronic pain    Pain Radiating Towards n/a    Pain Onset 1 to 4 weeks ago    Aggravating Factors  sitting or standing    Pain Relieving Factors reclining    Pain Score 1   "jab" with certain movements up to 9/10 which "takes my breath away"   Pain Location Back    Pain Orientation Right;Mid;Lower;Lateral    Pain Descriptors / Indicators Jabbing    Pain Type Acute pain    Pain Radiating Towards n/a    Pain Onset In the past 7 days    Aggravating Factors  sit <> stand, bending over    Pain Relieving Factors reclining or layin down, holding still    Effect of Pain on Daily Activities cause him to have to stop mid-motion                [facility] PT Assessment - 07/07/21 0934       Assessment   Medical Diagnosis L upper and R mid/low back pain    Referring Provider (PT) Orma Render, MD    Onset Date/Surgical Date 06/15/21    Hand Dominance Right    Next MD Visit none scheduled    Prior Therapy recent PT for same/similar problem      Precautions   Precautions Fall      Restrictions   Weight Bearing Restrictions No      Balance Screen   Has the patient fallen in the past 6 months Yes    How many times? 1    Has the patient had a decrease in activity level because of a fear of falling?  Yes    Is the patient reluctant to leave their home because of a fear of falling?  No      Home Environment   Living Environment Private residence    Living Arrangements Spouse/significant other    Type of [place] entry    [place] - single  point;Grab bars - toilet;Grab bars - tub/shower;Shower seat      Prior Function   Level of Independence Independent;Needs assistance  with ADLs   wife helps him tie his shoes   Vocation Retired    Leisure mostly sedentary      Cognition   Overall Cognitive Status Within Functional Limits for tasks assessed      Posture/Postural Control   Posture/Postural Control Postural limitations    Postural Limitations Rounded Shoulders;Increased thoracic kyphosis;Decreased lumbar lordosis;Flexed trunk      AROM   Overall AROM Comments moderately to severely limited thoracic and lumbar motion in most planes; B shoulder ROM WFL and consistent with motion at time of recent D/C form PT      Strength   Right Shoulder Flexion 4+/5    Right Shoulder ABduction 4+/5    Right Shoulder Internal Rotation 4/5    Right Shoulder External Rotation 4/5    Left Shoulder Flexion 4/5    Left Shoulder ABduction 4/5    Left Shoulder Internal Rotation 4-/5    Left Shoulder External Rotation 4-/5    Right Hip Flexion 4-/5   pain in R side of back   Right Hip ABduction 4/5    Right Hip ADduction 4/5    Left Hip Flexion 4/5    Left Hip ABduction 4/5    Left Hip ADduction 4/5      Palpation   Palpation comment palpable TPs in L mid/lower thoracic multifidus, rhomboids, lower traps & infraspinatus; increased muscle tension and palpable TPs in R thoraclumbar paraspinals & QL                        Objective measurements completed on examination: See above findings.       [place] Adult PT Treatment/Exercise - 07/07/21 0934       Lumbar Exercises: Stretches   Standing Side Bend Right;Left;2 reps;30 seconds    Standing Side Bend Limitations seated QL stretch    Quadruped Mid Back Stretch 2 reps;30 seconds    Quadruped Mid Back Stretch Limitations seated 3-way prayer stretch with cane    Other Lumbar Stretch Exercise Seated R/L trunk rotation stretch using arm of chair x 30 sec      Lumbar  Exercises: Seated   Other Seated Lumbar Exercises B trunk rotation with arms crossed over chest x 5                     PT Education - 07/07/21 1020     Education Details PT eval findings, anticipated POC & initial HEP - Access Code: [email or web address]    Person(s) Educated Patient    Methods Explanation;Demonstration;Verbal cues;Tactile cues;Handout    Comprehension Verbalized understanding;Verbal cues required;Tactile cues required;Returned demonstration;Need further instruction                 PT Long Term Goals - 07/07/21 1024       PT LONG TERM GOAL #1   Title Patient will be independent with ongoing/advanced HEP for self-management at home    Status New    Target Date 08/11/21      PT LONG TERM GOAL #2   Title Patient to report reduction in frequency and intensity of thoracolumbar back pain by >/= 50% to allow for improved QOL    Status New    Target Date 08/11/21      PT LONG TERM GOAL #3   Title Patient will report ability to sit for up to 1 hr w/o limitation due to back pain to allow patient to sit through meals at ILF  Status New    Target Date 08/11/21      PT LONG TERM GOAL #4   Title Patient will be able to transition sit <> stand w/o limitation due to back pain >/= 4/10    Status New    Target Date 08/11/21                    Plan - 07/07/21 1024     Clinical Impression Statement Lenford is an 87 y/o male with Parkinson's disease who presents to OP PT with acute on chronic L upper and R mid/lower back pain s/p a fall on 06/15/21. Pt reports a posterior LOB where he landed hard in his recliner jarring his back and exacerbating his L upper back/thoracic pain recently addressed in PT just over 1 month ago as well as his chronic mid and low back pain from post-laminectomy syndrome. He reports increased pain with flexion movements as well as sit <> stand transfers. Deficits include postural abnormalities, significantly limited thoracolumbar AROM,  increased muscle tension with multiple TPs, and decreased proximal UE/LE strength. Rah[place] will benefit from skilled PT to address above deficits and improve functional ROM and strength with decreased pain to increase tolerance for ADLs and daily activities. Prior PT referral from [place], DO at time of last PT episode had recommended PT to address balance and mobility deficits related to Parkinson's disease but pt had refused at the time - given recent fall, discussed possibility of expanding scope of PT to address balance and safety with mobility to reduce risk for further falls but pt remains unwilling to do so at this time.    Personal Factors and Comorbidities Age;Comorbidity 3+;Fitness;Past/Current Experience;Social Background    Comorbidities Parkinson's, arthritis, chronic pain, multiple back surgeries/fusions, CKD, falls    Examination-Activity Limitations Bend;Sit;Stand;Transfers;Locomotion Level    Examination-Participation Restrictions Community Activity;Interpersonal Relationship    Stability/Clinical Decision Making Evolving/Moderate complexity    Clinical Decision Making Moderate    Rehab Potential Fair    PT Frequency 1x / week    PT Duration 4 weeks    PT Treatment/Interventions ADLs/Self Care Home Management;Cryotherapy;Electrical Stimulation;Iontophoresis 4mg /ml Dexamethasone;Moist Heat;Ultrasound;DME Instruction;Gait training;Stair training;Functional mobility training;Therapeutic activities;Therapeutic exercise;Balance training;Neuromuscular re-education;Patient/family education;Manual techniques;Passive range of motion;Dry needling;Energy conservation;Taping;Spinal Manipulations;Joint Manipulations    PT Next Visit Plan review initial HEP as well as former HEP for scapular strengthening and thoracic mobility - progress as tolerated, manual therapy including potential DN as indicated and modalities to L thoracic spine & R thoracolumbar spine/flank PRN    PT Home Exercise Plan  Access Code: [email or web address] (10/14)    Consulted and Agree with Plan of Care Patient             Patient will benefit from skilled therapeutic intervention in order to improve the following deficits and impairments:  Abnormal gait, Cardiopulmonary status limiting activity, Decreased activity tolerance, Decreased balance, Decreased coordination, Decreased endurance, Decreased knowledge of use of DME, Decreased mobility, Decreased range of motion, Decreased strength, Difficulty walking, Hypomobility, Increased fascial restricitons, Increased muscle spasms, Impaired perceived functional ability, Impaired flexibility, Improper body mechanics, Postural dysfunction, Pain  Visit Diagnosis: Pain in thoracic spine  Chronic right-sided low back pain without sciatica  Other muscle spasm  Abnormal posture  Muscle weakness (generalized)  Other abnormalities of gait and mobility     Problem List Patient Active Problem List   Diagnosis Date Noted   Moderate mitral regurgitation 01/12/2021   Anemia due to chronic blood loss 01/10/2021   Benign prostatic hyperplasia  01/10/2021   Benign prostatic hyperplasia with lower urinary tract symptoms 01/10/2021   Chronic kidney disease, stage 3 unspecified ([place]) 01/10/2021   Peripheral edema 01/10/2021   Enlarged prostate 01/10/2021   Hardening of the aorta (main artery of the heart) ([place]) 01/10/2021   Hypoalbuminemia 01/10/2021   Impaired fasting glucose 01/10/2021   Iron deficiency anemia 01/10/2021   Kidney stone 01/10/2021   Mitral valve disorder 01/10/2021   Osteopenia 01/10/2021   Other specified disorders of kidney and ureter 01/10/2021   Peripheral neuralgia 01/10/2021   Sciatica 01/10/2021   Personal history of colonic polyps 01/10/2021   Personal history of other diseases of the respiratory system 01/10/2021   Pure hypercholesterolemia 01/10/2021   Thrombocytopenia ([place]) 01/10/2021   Tubular adenoma of colon 01/10/2021   Unspecified  abnormal finding in specimens from other organs, systems and tissues 01/10/2021   Edema 01/10/2021   Old myocardial infarction 01/10/2021   Osteoarthrosis 01/10/2021   Intermittent tremor 01/10/2021   Arthritis    History of kidney stones    Hypertension    Parkinson's disease ([place])    Occasional tremors 07/26/2020   Chronic bilateral low back pain 07/26/2020   Sensorineural hearing loss (SNHL) of both ears 06/15/2020   Muscle pain 03/01/2020   Degenerative lumbar spinal stenosis 09/22/2019   Body mass index (BMI) 25.0-25.9, adult 08/31/2019   Scoliosis concern 08/31/2019   Lumbar stenosis with neurogenic claudication 08/31/2019   Spinal stenosis, lumbar region without neurogenic claudication 08/19/2019   Hypotension 06/23/2019   Myocarditis ([place]) 05/19/2019   CAD (coronary artery disease) 04/20/2019   Obesity 04/06/2019   Malnutrition of moderate degree 03/23/2019   NSTEMI (non-ST elevated myocardial infarction) ([place]) 03/21/2019   Myocardial infarction ([place]) 03/21/2019   Anemia 09/21/2017   Symptomatic anemia 09/20/2017   Protein-calorie malnutrition, severe 09/11/2017   Empyema lung ([place]) 09/09/2017   SIRS (systemic inflammatory response syndrome) ([place]) 09/05/2017   Elevated troponin 09/05/2017   Normochromic normocytic anemia 09/05/2017   Hyponatremia 09/05/2017   Acute kidney injury superimposed on chronic kidney disease ([place]) 09/05/2017   Chronic pain 09/05/2017   Pleural effusion, left 09/04/2017   Pleural effusion on left 08/19/2017   Sinusitis, chronic 08/19/2017   Fall in home 07/10/2017   Murmur 01/08/2017   Skin ulcer of toe of left foot, limited to breakdown of skin ([place]) 09/04/2016   Displacement of cervical intervertebral disc 07/19/2014   Cervical myelopathy with cervical radiculopathy ([place]) 06/24/2014   LBBB (left bundle branch block) 06/21/2014   Hyperlipidemia 06/21/2014   Preoperative cardiovascular examination 06/21/2014   Degenerative cervical spinal  stenosis 05/26/2014   Cervical spondylosis without myelopathy 05/12/2014   Neck pain 03/03/2014   Shoulder joint pain 12/01/2013   Spinal stenosis of lumbar region 08/10/2013   Chronic low back pain 07/29/2013   Lumbar radiculopathy 07/29/2013   Lumbar post-laminectomy syndrome 07/15/2013   Obstructive sleep apnea 09/30/2010   Sleep apnea 2012   Essential hypertension 08/29/2010    Percival Spanish, PT 07/07/2021, 12:54 PM  [facility] [address]  [place], [place], [address] Phone: [phone]   Fax:  [phone]  Name: BUNNIE LEDERMAN MRN: [id number] Date of Birth: 1933/07/08

## 2021-07-13 ENCOUNTER — Ambulatory Visit: Payer: Medicare Other | Admitting: Physical Therapy

## 2021-07-13 ENCOUNTER — Ambulatory Visit: Payer: Medicare Other | Admitting: Cardiology

## 2021-07-17 ENCOUNTER — Encounter: Payer: Medicare Other | Admitting: Physical Therapy

## 2021-07-28 ENCOUNTER — Other Ambulatory Visit: Payer: Self-pay

## 2021-07-28 ENCOUNTER — Ambulatory Visit: Payer: Medicare Other | Attending: Neurosurgery | Admitting: Physical Therapy

## 2021-07-28 ENCOUNTER — Encounter: Payer: Self-pay | Admitting: Physical Therapy

## 2021-07-28 DIAGNOSIS — R2689 Other abnormalities of gait and mobility: Secondary | ICD-10-CM | POA: Diagnosis present

## 2021-07-28 DIAGNOSIS — M545 Low back pain, unspecified: Secondary | ICD-10-CM | POA: Insufficient documentation

## 2021-07-28 DIAGNOSIS — G8929 Other chronic pain: Secondary | ICD-10-CM | POA: Diagnosis present

## 2021-07-28 DIAGNOSIS — M62838 Other muscle spasm: Secondary | ICD-10-CM

## 2021-07-28 DIAGNOSIS — M546 Pain in thoracic spine: Secondary | ICD-10-CM | POA: Diagnosis present

## 2021-07-28 DIAGNOSIS — M6281 Muscle weakness (generalized): Secondary | ICD-10-CM

## 2021-07-28 DIAGNOSIS — R293 Abnormal posture: Secondary | ICD-10-CM

## 2021-07-28 NOTE — Therapy (Signed)
[facility] [address]  [place], [place], [address] Phone: [phone]   Fax:  [phone]  Physical Therapy Treatment  Patient Details  Name: Aaron Schmidt MRN: [id number] Date of Birth: 01-10-1933 Referring Provider (PT): Orma Render, MD   Encounter Date: 07/28/2021   PT End of Session - 07/28/21 1108     Visit Number 2    Number of Visits 4    Date for PT Re-Evaluation 08/11/21    Authorization Type [employer] Medicare    PT Start Time 1106   Pt arrived late   PT Stop Time 1145    PT Time Calculation (min) 39 min    Activity Tolerance Patient tolerated treatment well    Behavior During Therapy [facility] for tasks assessed/performed             Past Medical History:  Diagnosis Date   Acute kidney injury superimposed on chronic kidney disease ([place]) 09/05/2017   Anemia 09/21/2017   Anemia due to chronic blood loss 01/10/2021   Arthritis    Degeneration spine & stenosis   Benign prostatic hyperplasia 01/10/2021   Benign prostatic hyperplasia with lower urinary tract symptoms 01/10/2021   Body mass index (BMI) 25.0-25.9, adult 08/31/2019   CAD (coronary artery disease) 04/20/2019   Cervical myelopathy with cervical radiculopathy ([place]) 06/24/2014   Cervical spondylosis without myelopathy 05/12/2014   Chronic bilateral low back pain 07/26/2020   Chronic kidney disease, stage 3 unspecified ([place]) 01/10/2021   Chronic low back pain 07/29/2013   Chronic pain 09/05/2017   Degenerative cervical spinal stenosis 05/26/2014   Degenerative lumbar spinal stenosis 09/22/2019   Displacement of cervical intervertebral disc 07/19/2014   Edema 01/10/2021   Elevated troponin 09/05/2017   Empyema lung ([place]) 09/09/2017   Enlarged prostate 01/10/2021   Essential hypertension 08/29/2010   Qualifier: Diagnosis of  By: Elsworth Soho MD, Leanna Sato     Fall in home 07/10/2017   Hardening of the aorta (main artery of the heart) ([place]) 01/10/2021   History of  kidney stones    Hyperlipidemia 06/21/2014   Hypertension    has a histroy of   Hypoalbuminemia 01/10/2021   Hyponatremia 09/05/2017   Hypotension    Impaired fasting glucose 01/10/2021   Intermittent tremor 01/10/2021   Iron deficiency anemia 01/10/2021   Kidney stone 01/10/2021   LBBB (left bundle branch block) 06/21/2014   Lumbar post-laminectomy syndrome 07/15/2013   Lumbar radiculopathy 07/29/2013   Lumbar stenosis with neurogenic claudication 08/31/2019   Malnutrition of moderate degree 03/23/2019   Mitral valve disorder 01/10/2021   Murmur 01/08/2017   Muscle pain 03/01/2020   Myocardial infarction ([place]) 03/21/2019   Neck pain 03/03/2014   Normochromic normocytic anemia 09/05/2017   NSTEMI (non-ST elevated myocardial infarction) ([place]) 03/21/2019   Obesity 04/06/2019   Obstructive sleep apnea 09/30/2010   PSG (180 lbs ) >> severe obstructive sleep apnea with AHi 38/h & desatns to 82%  Started on auto 5-15  >> changed to 8 cm Download reviewed 1/21 - 11/23/10 >> residual events of 6/h , avg pr 8 cm, leak + with nasal pillows     Occasional tremors 07/26/2020   Old myocardial infarction 01/10/2021   Osteoarthrosis 01/10/2021   Osteopenia 01/10/2021   Other specified disorders of kidney and ureter 01/10/2021   Parkinson's disease ([place])    Peripheral edema 01/10/2021   Peripheral neuralgia 01/10/2021   Personal history of colonic polyps 01/10/2021   Personal history of other  diseases of the respiratory system 01/10/2021   Pleural effusion on left 08/19/2017   Acute symptoms since 08/13/17 > CTa  08/13/17  neg pe/ extensively loculated effusion - L thoracentesis 08/20/2017 :  220 cc with glucose < 20 and WBC 5,338 mostly Poly, LDH 688 > cyt neg >Referrd to T surgery > VATS 09/09/17 c/w empyema   Pleural effusion, left 09/04/2017   Preoperative cardiovascular examination 06/21/2014   Protein-calorie malnutrition, severe 09/11/2017   Pure hypercholesterolemia 01/10/2021   Sciatica 01/10/2021   Scoliosis  concern 08/31/2019   Sensorineural hearing loss (SNHL) of both ears 06/15/2020   Last Assessment & Plan:  Formatting of this note might be different from the original. Concern over worsening hearing. Chronic history of sensorineural hearing loss bilaterally.  Has hearing aids currently.  He feels his hearing aids are not working as well as they historically have.  Recently had them checked. EXAM shows normal external canals and tympanic membranes bilaterally. AUDIOGRAM Shows B   Shoulder joint pain 12/01/2013   Sinusitis, chronic 08/19/2017   CT sinus  08/20/2017 >>> Normally aerated paranasal sinuses.  Patent sinus drainage pathways.   SIRS (systemic inflammatory response syndrome) ([place]) 09/05/2017   Skin ulcer of toe of left foot, limited to breakdown of skin ([place]) 09/04/2016   Sleep apnea 2012   used CPAP 2 yrs. ago, feels he sleeps better w/o, no longer using    Spinal stenosis of lumbar region 08/10/2013   Spinal stenosis, lumbar region without neurogenic claudication 08/19/2019   Symptomatic anemia 09/20/2017   Thrombocytopenia ([place]) 01/10/2021   Tubular adenoma of colon 01/10/2021   Unspecified abnormal finding in specimens from other organs, systems and tissues 01/10/2021    Past Surgical History:  Procedure Laterality Date   ANTERIOR LAT LUMBAR FUSION N/A 09/22/2019   Procedure: Lumbar one-two, Lumbar two-three Anterolateral lumbar interbody fusion with pedicle screw fixation and exploration of adjacent fusion;  Surgeon: Erline Levine, MD;  Location: [facility];  Service: Neurosurgery;  Laterality: N/A;   ARM NEUROPLASTY     at 14 yrs. of age- fell off tractor- had fracture & repair *& later- 1990's had  transplantation of a nerve at the elbow   BACK SURGERY     x4 back surgery x2 fusion -   CARDIAC CATHETERIZATION  03/23/2019   CARPAL TUNNEL RELEASE Right    COLONOSCOPY     ESOPHAGOGASTRODUODENOSCOPY (EGD) WITH PROPOFOL N/A 09/23/2017   Procedure: ESOPHAGOGASTRODUODENOSCOPY (EGD) WITH  PROPOFOL;  Surgeon: Laurence Spates, MD;  Location: [facility];  Service: Endoscopy;  Laterality: N/A;   LEFT HEART CATH AND CORONARY ANGIOGRAPHY N/A 03/23/2019   Procedure: LEFT HEART CATH AND CORONARY ANGIOGRAPHY;  Surgeon: Burnell Blanks, MD;  Location: [facility];  Service: Cardiovascular;  Laterality: N/A;   LUMBAR PERCUTANEOUS PEDICLE SCREW 2 LEVEL N/A 09/22/2019   Procedure: LUMBAR PERCUTANEOUS PEDICLE SCREW TWO LEVEL;  Surgeon: Erline Levine, MD;  Location: [facility];  Service: Neurosurgery;  Laterality: N/A;   PLEURAL EFFUSION DRAINAGE Left 09/09/2017   Procedure: DRAINAGE OF PLEURAL EFFUSION;  Surgeon: [name], MD;  Location: [facility];  Service: Thoracic;  Laterality: Left;   POSTERIOR CERVICAL FUSION/FORAMINOTOMY N/A 06/24/2014   Procedure: Posterior Cervical Three-Seven Fusion with Lateral Mass Fixation;  Surgeon: Erline Levine, MD;  Location: [facility];  Service: Neurosurgery;  Laterality: N/A;  C3-C7 posterior cervical fusion with lateral mass fixation   SHOULDER SURGERY Bilateral    VIDEO ASSISTED THORACOSCOPY (VATS)/DECORTICATION Left 09/09/2017   Procedure: VIDEO  ASSISTED THORACOSCOPY (VATS)/DECORTICATION;  Surgeon: [name], MD;  Location: [facility];  Service: Thoracic;  Laterality: Left;   VIDEO BRONCHOSCOPY  09/09/2017   Procedure: VIDEO BRONCHOSCOPY;  Surgeon: [name], MD;  Location: [facility];  Service: Thoracic;;    There were no vitals filed for this visit.   Subjective Assessment - 07/28/21 1111     Subjective Pt reports he missed his last 2 PT appts because the pain was too bad. He reports someone from MD office put him on a 12 day dose pack of prednisone which has helped some but not as much as when previously used - still get "sharp jabs" of pain up to 10/10 when he tries to bend fwd at times.    Pertinent History Parkinson's disease, frequent falls, chronic LBP, history of multiple back/neck surgeries and fusions.     Patient Stated Goals "to get the 'hot spot' so I can stand & sit better"    Currently in Pain? Yes    Pain Score 4    up to 10/10 when he gets the "sharp jabs"   Pain Location Scapula    Pain Orientation Left    Pain Descriptors / Indicators Aching;Burning;Sharp;Jabbing    Pain Type Acute pain;Chronic pain    Pain Frequency Constant    Pain Score 4   up to 10/10 when he gets the sharp jabs   Pain Location Back    Pain Orientation Mid;Lower;Left;Lateral    Pain Descriptors / Indicators Aching;Burning;Sharp;Jabbing    Pain Type Acute pain;Chronic pain    Pain Frequency Constant                               [facility] Adult PT Treatment/Exercise - 07/28/21 1106       Manual Therapy   Manual Therapy Soft tissue mobilization;Myofascial release;Scapular mobilization    Manual therapy comments skilled palpation and monitoring during DN   pt positioned in sitting leaning foward over elevated hi/low table with pillows for comfort/positioning   Soft tissue mobilization STM/DTM to L thoracic paraspinals, rhomboids, middle/lower traps, infraspinatus, subscapularis, teres group & lats    Myofascial Release manual TPR  to L LS thoracic paraspinals, rhomboids, middle/lower traps, infraspinatus & subscapularis    Scapular Mobilization manual L subscpaular release; L medial/lateral scapular glide & rotation              Trigger Point Dry Needling - 07/28/21 1106     Consent Given? Yes    Education Handout Provided Previously provided    Muscles Treated Upper Quadrant Infraspinatus;Subscapularis;Latissimus dorsi;Teres major;Teres minor;Rhomboids;Middle trapezius;Lower trapezius   Left   Rhomboids Response Twitch response elicited;Palpable increased muscle length    Infraspinatus Response Twitch response elicited;Palpable increased muscle length   lower infraspinatus taking care to avoid cyst   Subscapularis Response Twitch response elicited;Palpable increased muscle length     Latissimus dorsi Response Twitch response elicited;Palpable increased muscle length    Teres major Response Twitch response elicited;Palpable increased muscle length    Teres minor Response Twitch response elicited;Palpable increased muscle length    Lower trapezius Response Twitch response elicited;Palpable increased muscle length    Middle trapezius Response Twitch response elicited;Palpable increased muscle length                     PT Short Term Goals - 04/28/21 1027       PT SHORT TERM GOAL #1   Title  Pt. will be compliant with initial HEP for scapular strengthening to decrease pain    Status Achieved   04/28/21              PT Long Term Goals - 07/07/21 1024       PT LONG TERM GOAL #1   Title Patient will be independent with ongoing/advanced HEP for self-management at home    Status New    Target Date 08/11/21      PT LONG TERM GOAL #2   Title Patient to report reduction in frequency and intensity of thoracolumbar back pain by >/= 50% to allow for improved QOL    Status New    Target Date 08/11/21      PT LONG TERM GOAL #3   Title Patient will report ability to sit for up to 1 hr w/o limitation due to back pain to allow patient to sit through meals at [place]    Target Date 08/11/21      PT LONG TERM GOAL #4   Title Patient will be able to transition sit <> stand w/o limitation due to back pain >/= 4/10    Status New    Target Date 08/11/21                   Plan - 07/28/21 1145     Clinical Impression Statement Larenzo reports he missed the last 2 PT visits due to increased pain and was prescribed a 12-day prednisone dose pack which he has just completed - some relief noted from prednisone but not as effective as previous times this has been prescribed. He reports limited compliance with HEP but admits to not completing the exercises as frequently as prescribed - he denies need for review and refuses warm-up, wanting to just focus on  DN and manual therapy. Reviewed explanation of DN rational, procedures, outcomes and potential side effects, including precautions with DN over the lung fields, and patient verbalized consent to DN treatment in conjunction with manual STM/DTM and TPR to reduce ttp/muscle tension. Muscles treated include Infraspinatus, Subscapularis, Latissimus dorsi, Teres major, Teres minor, Rhomboids, Middle and Lower trapezius with pt positioned in sitting leaning foward over elevated hi/low table with pillows for comfort/positioning as he is unable to tolerate normal prone positioning for these muscles. Due to limited positional tolerance, we were unable to attempt any DN to thoracolumbar paraspinals. DN produced normal response with good twitches elicited resulting in palpable reduction in pain/ttp and muscle tension. Pt reminded to expect mild to moderate muscle soreness for up to 24-48 hrs and instructed to continue prescribed home exercise program and current activity level with pt verbalizing understanding of theses instructions.    Comorbidities Parkinson's, arthritis, chronic pain, multiple back surgeries/fusions, CKD, falls    Rehab Potential Fair    PT Frequency 1x / week    PT Duration 4 weeks    PT Treatment/Interventions ADLs/Self Care Home Management;Cryotherapy;Electrical Stimulation;Iontophoresis 4mg /ml Dexamethasone;Moist Heat;Ultrasound;DME Instruction;Gait training;Stair training;Functional mobility training;Therapeutic activities;Therapeutic exercise;Balance training;Neuromuscular re-education;Patient/family education;Manual techniques;Passive range of motion;Dry needling;Energy conservation;Taping;Spinal Manipulations;Joint Manipulations    PT Next Visit Plan review initial HEP as well as former HEP for scapular strengthening and thoracic mobility - progress as tolerated, manual therapy including potential DN as indicated and modalities to L thoracic spine & B thoracolumbar spine/flank PRN    PT Home  Exercise Plan Access Code: [email or web address] (10/14)    Consulted and Agree with Plan of Care Patient  Patient will benefit from skilled therapeutic intervention in order to improve the following deficits and impairments:  Abnormal gait, Cardiopulmonary status limiting activity, Decreased activity tolerance, Decreased balance, Decreased coordination, Decreased endurance, Decreased knowledge of use of DME, Decreased mobility, Decreased range of motion, Decreased strength, Difficulty walking, Hypomobility, Increased fascial restricitons, Increased muscle spasms, Impaired perceived functional ability, Impaired flexibility, Improper body mechanics, Postural dysfunction, Pain  Visit Diagnosis: Pain in thoracic spine  Chronic right-sided low back pain without sciatica  Other muscle spasm  Abnormal posture  Muscle weakness (generalized)     Problem List Patient Active Problem List   Diagnosis Date Noted   Moderate mitral regurgitation 01/12/2021   Anemia due to chronic blood loss 01/10/2021   Benign prostatic hyperplasia 01/10/2021   Benign prostatic hyperplasia with lower urinary tract symptoms 01/10/2021   Chronic kidney disease, stage 3 unspecified ([place]) 01/10/2021   Peripheral edema 01/10/2021   Enlarged prostate 01/10/2021   Hardening of the aorta (main artery of the heart) ([place]) 01/10/2021   Hypoalbuminemia 01/10/2021   Impaired fasting glucose 01/10/2021   Iron deficiency anemia 01/10/2021   Kidney stone 01/10/2021   Mitral valve disorder 01/10/2021   Osteopenia 01/10/2021   Other specified disorders of kidney and ureter 01/10/2021   Peripheral neuralgia 01/10/2021   Sciatica 01/10/2021   Personal history of colonic polyps 01/10/2021   Personal history of other diseases of the respiratory system 01/10/2021   Pure hypercholesterolemia 01/10/2021   Thrombocytopenia ([place]) 01/10/2021   Tubular adenoma of colon 01/10/2021   Unspecified abnormal finding in specimens from  other organs, systems and tissues 01/10/2021   Edema 01/10/2021   Old myocardial infarction 01/10/2021   Osteoarthrosis 01/10/2021   Intermittent tremor 01/10/2021   Arthritis    History of kidney stones    Hypertension    Parkinson's disease ([place])    Occasional tremors 07/26/2020   Chronic bilateral low back pain 07/26/2020   Sensorineural hearing loss (SNHL) of both ears 06/15/2020   Muscle pain 03/01/2020   Degenerative lumbar spinal stenosis 09/22/2019   Body mass index (BMI) 25.0-25.9, adult 08/31/2019   Scoliosis concern 08/31/2019   Lumbar stenosis with neurogenic claudication 08/31/2019   Spinal stenosis, lumbar region without neurogenic claudication 08/19/2019   Hypotension 06/23/2019   Myocarditis ([place]) 05/19/2019   CAD (coronary artery disease) 04/20/2019   Obesity 04/06/2019   Malnutrition of moderate degree 03/23/2019   NSTEMI (non-ST elevated myocardial infarction) ([place]) 03/21/2019   Myocardial infarction ([place]) 03/21/2019   Anemia 09/21/2017   Symptomatic anemia 09/20/2017   Protein-calorie malnutrition, severe 09/11/2017   Empyema lung ([place]) 09/09/2017   SIRS (systemic inflammatory response syndrome) ([place]) 09/05/2017   Elevated troponin 09/05/2017   Normochromic normocytic anemia 09/05/2017   Hyponatremia 09/05/2017   Acute kidney injury superimposed on chronic kidney disease ([place]) 09/05/2017   Chronic pain 09/05/2017   Pleural effusion, left 09/04/2017   Pleural effusion on left 08/19/2017   Sinusitis, chronic 08/19/2017   Fall in home 07/10/2017   Murmur 01/08/2017   Skin ulcer of toe of left foot, limited to breakdown of skin ([place]) 09/04/2016   Displacement of cervical intervertebral disc 07/19/2014   Cervical myelopathy with cervical radiculopathy ([place]) 06/24/2014   LBBB (left bundle branch block) 06/21/2014   Hyperlipidemia 06/21/2014   Preoperative cardiovascular examination 06/21/2014   Degenerative cervical spinal stenosis 05/26/2014   Cervical  spondylosis without myelopathy 05/12/2014   Neck pain 03/03/2014   Shoulder joint pain 12/01/2013   Spinal stenosis of lumbar region 08/10/2013  Chronic low back pain 07/29/2013   Lumbar radiculopathy 07/29/2013   Lumbar post-laminectomy syndrome 07/15/2013   Obstructive sleep apnea 09/30/2010   Sleep apnea 2012   Essential hypertension 08/29/2010    Percival Spanish, PT 07/28/2021, 12:24 PM  [facility] [address]  [place], [place], [address] Phone: [phone]   Fax:  [phone]  Name: DUFFY DANTONIO MRN: [id number] Date of Birth: Feb 21, 1933

## 2021-07-31 ENCOUNTER — Ambulatory Visit: Payer: Medicare Other | Admitting: Cardiology

## 2021-07-31 ENCOUNTER — Other Ambulatory Visit: Payer: Self-pay

## 2021-07-31 ENCOUNTER — Encounter: Payer: Self-pay | Admitting: Cardiology

## 2021-07-31 VITALS — BP 116/60 | HR 72 | Ht 70.0 in | Wt 152.0 lb

## 2021-07-31 DIAGNOSIS — I2510 Atherosclerotic heart disease of native coronary artery without angina pectoris: Secondary | ICD-10-CM | POA: Diagnosis not present

## 2021-07-31 DIAGNOSIS — G20 Parkinson's disease: Secondary | ICD-10-CM | POA: Diagnosis not present

## 2021-07-31 DIAGNOSIS — I10 Essential (primary) hypertension: Secondary | ICD-10-CM | POA: Diagnosis not present

## 2021-07-31 DIAGNOSIS — I447 Left bundle-branch block, unspecified: Secondary | ICD-10-CM | POA: Diagnosis not present

## 2021-07-31 DIAGNOSIS — I340 Nonrheumatic mitral (valve) insufficiency: Secondary | ICD-10-CM

## 2021-07-31 NOTE — Progress Notes (Signed)
Cardiology Office Note:    Date:  07/31/2021   ID:  ERRIK Schmidt, DOB 11-May-1933, MRN [id number]  PCP:  Javier Glazier, MD  Cardiologist:  Jenne Campus, MD    Referring MD: Javier Glazier, MD   Chief Complaint  Patient presents with   Follow-up  I am doing fine but to have Parkinson  History of Present Illness:    Aaron Schmidt is a 87 y.o. male with past medical history significant for essential hypertension, chronic left bundle branch block, obstructive sleep apnea on CPAP, recently recognized Parkinson's disease.  In 2020 he came to hospital because of chest pain confusion he was find to have elevation of troponin cardiac catheterization performed which showed no evidence of CAD ejection fraction on echocardiogram 45 to 50% with moderate concentric left ventricle hypertrophy Apparently echocardiogram in 2018 showed normal function.  Also recent echocardiogram shows segmental wall motion abnormalities. He comes today to my office for follow-up.  Overall complain of having chronic back pain as well as parkinsonian tiredness and fatigue.  There is no shortness of breath no swelling of lower extremities there is no paroxysmal nocturnal dyspnea.  Past Medical History:  Diagnosis Date   Acute kidney injury superimposed on chronic kidney disease ([place]) 09/05/2017   Anemia 09/21/2017   Anemia due to chronic blood loss 01/10/2021   Arthritis    Degeneration spine & stenosis   Benign prostatic hyperplasia 01/10/2021   Benign prostatic hyperplasia with lower urinary tract symptoms 01/10/2021   Body mass index (BMI) 25.0-25.9, adult 08/31/2019   CAD (coronary artery disease) 04/20/2019   Cervical myelopathy with cervical radiculopathy (HCC) 06/24/2014   Cervical spondylosis without myelopathy 05/12/2014   Chronic bilateral low back pain 07/26/2020   Chronic kidney disease, stage 3 unspecified ([place]) 01/10/2021   Chronic low back pain 07/29/2013   Chronic pain 09/05/2017   Degenerative  cervical spinal stenosis 05/26/2014   Degenerative lumbar spinal stenosis 09/22/2019   Displacement of cervical intervertebral disc 07/19/2014   Edema 01/10/2021   Elevated troponin 09/05/2017   Empyema lung ([place]) 09/09/2017   Enlarged prostate 01/10/2021   Essential hypertension 08/29/2010   Qualifier: Diagnosis of  By: Elsworth Soho MD, Leanna Sato     Fall in home 07/10/2017   Hardening of the aorta (main artery of the heart) ([place]) 01/10/2021   History of kidney stones    Hyperlipidemia 06/21/2014   Hypertension    has a histroy of   Hypoalbuminemia 01/10/2021   Hyponatremia 09/05/2017   Hypotension    Impaired fasting glucose 01/10/2021   Intermittent tremor 01/10/2021   Iron deficiency anemia 01/10/2021   Kidney stone 01/10/2021   LBBB (left bundle branch block) 06/21/2014   Lumbar post-laminectomy syndrome 07/15/2013   Lumbar radiculopathy 07/29/2013   Lumbar stenosis with neurogenic claudication 08/31/2019   Malnutrition of moderate degree 03/23/2019   Mitral valve disorder 01/10/2021   Murmur 01/08/2017   Muscle pain 03/01/2020   Myocardial infarction ([place]) 03/21/2019   Neck pain 03/03/2014   Normochromic normocytic anemia 09/05/2017   NSTEMI (non-ST elevated myocardial infarction) ([place]) 03/21/2019   Obesity 04/06/2019   Obstructive sleep apnea 09/30/2010   PSG (180 lbs ) >> severe obstructive sleep apnea with AHi 38/h & desatns to 82%  Started on auto 5-15  >> changed to 8 cm Download reviewed 1/21 - 11/23/10 >> residual events of 6/h , avg pr 8 cm, leak + with nasal pillows     Occasional tremors 07/26/2020   Old myocardial  infarction 01/10/2021   Osteoarthrosis 01/10/2021   Osteopenia 01/10/2021   Other specified disorders of kidney and ureter 01/10/2021   Parkinson's disease ([place])    Peripheral edema 01/10/2021   Peripheral neuralgia 01/10/2021   Personal history of colonic polyps 01/10/2021   Personal history of other diseases of the respiratory system 01/10/2021   Pleural effusion on left 08/19/2017    Acute symptoms since 08/13/17 > CTa  08/13/17  neg pe/ extensively loculated effusion - L thoracentesis 08/20/2017 :  220 cc with glucose < 20 and WBC 5,338 mostly Poly, LDH 688 > cyt neg >Referrd to T surgery > VATS 09/09/17 c/w empyema   Pleural effusion, left 09/04/2017   Preoperative cardiovascular examination 06/21/2014   Protein-calorie malnutrition, severe 09/11/2017   Pure hypercholesterolemia 01/10/2021   Sciatica 01/10/2021   Scoliosis concern 08/31/2019   Sensorineural hearing loss (SNHL) of both ears 06/15/2020   Last Assessment & Plan:  Formatting of this note might be different from the original. Concern over worsening hearing. Chronic history of sensorineural hearing loss bilaterally.  Has hearing aids currently.  He feels his hearing aids are not working as well as they historically have.  Recently had them checked. EXAM shows normal external canals and tympanic membranes bilaterally. AUDIOGRAM Shows B   Shoulder joint pain 12/01/2013   Sinusitis, chronic 08/19/2017   CT sinus  08/20/2017 >>> Normally aerated paranasal sinuses.  Patent sinus drainage pathways.   SIRS (systemic inflammatory response syndrome) ([place]) 09/05/2017   Skin ulcer of toe of left foot, limited to breakdown of skin ([place]) 09/04/2016   Sleep apnea 2012   used CPAP 2 yrs. ago, feels he sleeps better w/o, no longer using    Spinal stenosis of lumbar region 08/10/2013   Spinal stenosis, lumbar region without neurogenic claudication 08/19/2019   Symptomatic anemia 09/20/2017   Thrombocytopenia ([place]) 01/10/2021   Tubular adenoma of colon 01/10/2021   Unspecified abnormal finding in specimens from other organs, systems and tissues 01/10/2021    Past Surgical History:  Procedure Laterality Date   ANTERIOR LAT LUMBAR FUSION N/A 09/22/2019   Procedure: Lumbar one-two, Lumbar two-three Anterolateral lumbar interbody fusion with pedicle screw fixation and exploration of adjacent fusion;  Surgeon: Erline Levine, MD;   Location: [facility];  Service: Neurosurgery;  Laterality: N/A;   ARM NEUROPLASTY     at 14 yrs. of age- fell off tractor- had fracture & repair *& later- 1990's had  transplantation of a nerve at the elbow   BACK SURGERY     x4 back surgery x2 fusion -   CARDIAC CATHETERIZATION  03/23/2019   CARPAL TUNNEL RELEASE Right    COLONOSCOPY     ESOPHAGOGASTRODUODENOSCOPY (EGD) WITH PROPOFOL N/A 09/23/2017   Procedure: ESOPHAGOGASTRODUODENOSCOPY (EGD) WITH PROPOFOL;  Surgeon: Laurence Spates, MD;  Location: [facility];  Service: Endoscopy;  Laterality: N/A;   LEFT HEART CATH AND CORONARY ANGIOGRAPHY N/A 03/23/2019   Procedure: LEFT HEART CATH AND CORONARY ANGIOGRAPHY;  Surgeon: Burnell Blanks, MD;  Location: [facility];  Service: Cardiovascular;  Laterality: N/A;   LUMBAR PERCUTANEOUS PEDICLE SCREW 2 LEVEL N/A 09/22/2019   Procedure: LUMBAR PERCUTANEOUS PEDICLE SCREW TWO LEVEL;  Surgeon: Erline Levine, MD;  Location: [facility];  Service: Neurosurgery;  Laterality: N/A;   PLEURAL EFFUSION DRAINAGE Left 09/09/2017   Procedure: DRAINAGE OF PLEURAL EFFUSION;  Surgeon: [name], MD;  Location: [facility];  Service: Thoracic;  Laterality: Left;   POSTERIOR CERVICAL FUSION/FORAMINOTOMY N/A 06/24/2014   Procedure: Posterior  Cervical Three-Seven Fusion with Lateral Mass Fixation;  Surgeon: Erline Levine, MD;  Location: [facility];  Service: Neurosurgery;  Laterality: N/A;  C3-C7 posterior cervical fusion with lateral mass fixation   SHOULDER SURGERY Bilateral    VIDEO ASSISTED THORACOSCOPY (VATS)/DECORTICATION Left 09/09/2017   Procedure: VIDEO ASSISTED THORACOSCOPY (VATS)/DECORTICATION;  Surgeon: [name], MD;  Location: [facility];  Service: Thoracic;  Laterality: Left;   VIDEO BRONCHOSCOPY  09/09/2017   Procedure: VIDEO BRONCHOSCOPY;  Surgeon: [name], MD;  Location: [facility];  Service: Thoracic;;    Current Medications: Current Meds  Medication Sig   aspirin EC 81  MG tablet Take 81 mg by mouth daily.   carbidopa-levodopa (SINEMET IR) 25-100 MG tablet TAKE 1 TABLET BY MOUTH 3  TIMES DAILY AT 7AM, 11AM  AND 4PM (Patient taking differently: Take 1 tablet by mouth 3 (three) times daily.)   Cholecalciferol (VITAMIN D) 2000 UNITS CAPS Take 2,000 Units by mouth daily.   docusate sodium (COLACE) 100 MG capsule Take 600 mg by mouth daily.   finasteride (PROSCAR) 5 MG tablet Take 5 mg by mouth daily.   fluocinonide cream (LIDEX) 1.19 % Apply 1 application topically daily as needed (itching).   HYDROmorphone (DILAUDID) 2 MG tablet Take 2 tablets (4 mg total) by mouth 4 (four) times daily. (Patient taking differently: Take 4 mg by mouth See admin instructions. Take 1 tab every 5 hours as needed for pain do not exceed more than 5 per day)   L-Lysine 500 MG CAPS Take 500 mg by mouth daily.   losartan (COZAAR) 25 MG tablet Take 1 tablet (25 mg total) by mouth daily.   mineral oil liquid Take 108 mLs by mouth daily in the afternoon.    Multiple Vitamin (MULTIVITAMIN WITH MINERALS) TABS tablet Take 1 tablet by mouth daily. Mature mens 50+/Unknown Strength   nortriptyline (PAMELOR) 25 MG capsule Take 25 mg by mouth at bedtime.    OVER THE COUNTER MEDICATION Take 3 tablets by mouth daily. Perdiem 15 mg   polyethylene glycol (MIRALAX / GLYCOLAX) packet Take 17 g by mouth daily.   Potassium Chloride ER 20 MEQ TBCR Take 20 mEq by mouth daily.   pyridOXINE (VITAMIN B-6) 100 MG tablet Take 100 mg by mouth daily.   tiZANidine (ZANAFLEX) 2 MG tablet Take 4 mg by mouth 3 (three) times daily.   vitamin B-12 (CYANOCOBALAMIN) 1000 MCG tablet Take 1,000 mcg by mouth daily.    vitamin C (ASCORBIC ACID) 500 MG tablet Take 500 mg by mouth daily.     Allergies:   Baclofen, Doxazosin mesylate, and Diazepam   Social History   Socioeconomic History   Marital status: Married    Spouse name: Not on file   Number of children: Not on file   Years of education: Not on file   Highest  education level: Not on file  Occupational History   Occupation: Retired    Comment: Worked for [employer] and retired in [place] Use   Smoking status: Former    Types: Pipe    Quit date: 09/24/1978    Years since quitting: 42.8   Smokeless tobacco: Never  Vaping Use   Vaping Use: Never used  Substance and Sexual Activity   Alcohol use: Yes    Comment: 3 drinks/day - gin or vodka    Drug use: No   Sexual activity: Not on file  Other Topics Concern   Not on file  Social History Narrative  Right Handed    Lives in a one story home - on the 2nd floor with elevators   Social Determinants of Health   Financial Resource Strain: Not on file  Food Insecurity: Not on file  Transportation Needs: Not on file  Physical Activity: Not on file  Stress: Not on file  Social Connections: Not on file     Family History: The patient's family history includes Leukemia in his son; Pancreatic cancer in his mother. ROS:   Please see the history of present illness.    All 14 point review of systems negative except as described per history of present illness  EKGs/Labs/Other Studies Reviewed:      Recent Labs: 09/23/2020: B Natriuretic Peptide 79.5; BUN 18; Creatinine, Ser 0.87; Hemoglobin 11.4; Platelets 165; Potassium 4.0; Sodium 140  Recent Lipid Panel    Component Value Date/Time   CHOL 139 03/22/2019 1200   TRIG 72 03/22/2019 1200   HDL 66 03/22/2019 1200   CHOLHDL 2.1 03/22/2019 1200   VLDL 14 03/22/2019 1200   LDLCALC 59 03/22/2019 1200    Physical Exam:    VS:  BP 116/60 (BP Location: Left Arm, Patient Position: Sitting)   Pulse 72   Ht 5\' 10"  (1.778 m)   Wt 152 lb (68.9 kg)   SpO2 97%   BMI 21.81 kg/m     Wt Readings from Last 3 Encounters:  07/31/21 152 lb (68.9 kg)  01/25/21 148 lb (67.1 kg)  01/12/21 151 lb 1.9 oz (68.5 kg)     GEN:  Well nourished, well developed in no acute distress HEENT: Normal NECK: No JVD; No carotid bruits LYMPHATICS: No  lymphadenopathy CARDIAC: RRR, holosystolic murmur grade 2/6 to 3/6 best heard at the apex, no rubs, no gallops RESPIRATORY:  Clear to auscultation without rales, wheezing or rhonchi  ABDOMEN: Soft, non-tender, non-distended MUSCULOSKELETAL:  No edema; No deformity  SKIN: Warm and dry LOWER EXTREMITIES: no swelling NEUROLOGIC:  Alert and oriented x 3 PSYCHIATRIC:  Normal affect   ASSESSMENT:    1. LBBB (left bundle branch block)   2. Coronary artery disease involving native coronary artery of native heart without angina pectoris   3. Primary hypertension   4. Parkinson's disease ([place])   5. Nonrheumatic mitral valve regurgitation    PLAN:    In order of problems listed above:  Left bundle branch block which is chronic noted on the EKG again.  I will schedule him to have echocardiogram to reassess left ventricle ejection fraction. Coronary disease with cardiac catheterization in 2020 showing nonobstructive disease. Parkinson's disease which is recently discovered disease.  He is being managed by neurology for it. Mitral regurgitation I will schedule him to have echocardiogram to assess leak from the valve and degree of it. History of cardiomyopathy with ejection fraction 45% with recent normalization.  Again echocardiogram will be rechecked Dyslipidemia I do have fasting blood profile from last year with LDL 53 HDL 73 offering fasting lipid profile he declined he said he is tired walking to the doctor's office.   Medication Adjustments/Labs and Tests Ordered: Current medicines are reviewed at length with the patient today.  Concerns regarding medicines are outlined above.  No orders of the defined types were placed in this encounter.  Medication changes: No orders of the defined types were placed in this encounter.   Signed, Park Liter, MD, [facility] 07/31/2021 2:37 PM    [place]

## 2021-07-31 NOTE — Patient Instructions (Signed)
Medication Instructions:  Your physician recommends that you continue on your current medications as directed. Please refer to the Current Medication list given to you today.  *If you need a refill on your cardiac medications before your next appointment, please call your pharmacy*   Lab Work: None If you have labs (blood work) drawn today and your tests are completely normal, you will receive your results only by: [place] (if you have MyChart) OR A paper copy in the mail If you have any lab test that is abnormal or we need to change your treatment, we will call you to review the results.   Testing/Procedures: Your physician has requested that you have an echocardiogram. Echocardiography is a painless test that uses sound waves to create images of your heart. It provides your doctor with information about the size and shape of your heart and how well your heart's chambers and valves are working. This procedure takes approximately one hour. There are no restrictions for this procedure.    Follow-Up: At [facility], you and your health needs are our priority.  As part of our continuing mission to provide you with exceptional heart care, we have created designated Provider Care Teams.  These Care Teams include your primary Cardiologist (physician) and Advanced Practice Providers (APPs -  Physician Assistants and Nurse Practitioners) who all work together to provide you with the care you need, when you need it.  We recommend signing up for the patient portal called "MyChart".  Sign up information is provided on this After Visit Summary.  MyChart is used to connect with patients for Virtual Visits (Telemedicine).  Patients are able to view lab/test results, encounter notes, upcoming appointments, etc.  Non-urgent messages can be sent to your provider as well.   To learn more about what you can do with MyChart, go to [email or web address].    Your next appointment:   1 year(s)  The  format for your next appointment:   In Person  Provider:   Jenne Campus, MD    Other Instructions  Echocardiogram An echocardiogram is a test that uses sound waves (ultrasound) to produce images of the heart. Images from an echocardiogram can provide important information about: Heart size and shape. The size and thickness and movement of your heart's walls. Heart muscle function and strength. Heart valve function or if you have stenosis. Stenosis is when the heart valves are too narrow. If blood is flowing backward through the heart valves (regurgitation). A tumor or infectious growth around the heart valves. Areas of heart muscle that are not working well because of poor blood flow or injury from a heart attack. Aneurysm detection. An aneurysm is a weak or damaged part of an artery wall. The wall bulges out from the normal force of blood pumping through the body. Tell a health care provider about: Any allergies you have. All medicines you are taking, including vitamins, herbs, eye drops, creams, and over-the-counter medicines. Any blood disorders you have. Any surgeries you have had. Any medical conditions you have. Whether you are pregnant or may be pregnant. What are the risks? Generally, this is a safe test. However, problems may occur, including an allergic reaction to dye (contrast) that may be used during the test. What happens before the test? No specific preparation is needed. You may eat and drink normally. What happens during the test?  You will take off your clothes from the waist up and put on a hospital gown. Electrodes or electrocardiogram (ECG)patches may be  placed on your chest. The electrodes or patches are then connected to a device that monitors your heart rate and rhythm. You will lie down on a table for an ultrasound exam. A gel will be applied to your chest to help sound waves pass through your skin. A handheld device, called a transducer, will be  pressed against your chest and moved over your heart. The transducer produces sound waves that travel to your heart and bounce back (or "echo" back) to the transducer. These sound waves will be captured in real-time and changed into images of your heart that can be viewed on a video monitor. The images will be recorded on a computer and reviewed by your health care provider. You may be asked to change positions or hold your breath for a short time. This makes it easier to get different views or better views of your heart. In some cases, you may receive contrast through an IV in one of your veins. This can improve the quality of the pictures from your heart. The procedure may vary among health care providers and hospitals. What can I expect after the test? You may return to your normal, everyday life, including diet, activities, and medicines, unless your health care provider tells you not to do that. Follow these instructions at home: It is up to you to get the results of your test. Ask your health care provider, or the department that is doing the test, when your results will be ready. Keep all follow-up visits. This is important. Summary An echocardiogram is a test that uses sound waves (ultrasound) to produce images of the heart. Images from an echocardiogram can provide important information about the size and shape of your heart, heart muscle function, heart valve function, and other possible heart problems. You do not need to do anything to prepare before this test. You may eat and drink normally. After the echocardiogram is completed, you may return to your normal, everyday life, unless your health care provider tells you not to do that. This information is not intended to replace advice given to you by your health care provider. Make sure you discuss any questions you have with your health care provider. Document Revised: 05/24/2021 Document Reviewed: 05/03/2020 Elsevier Patient Education   2022 [employer].

## 2021-07-31 NOTE — Addendum Note (Signed)
Addended by: Senaida Ores on: 07/31/2021 02:41 PM   Modules accepted: Orders

## 2021-08-04 NOTE — Progress Notes (Signed)
Assessment/Plan:   1.  Parkinsons Disease   -increase  carbidopa/levodopa 25/100, 2 tablet/2 tablet/1 tablet  -in PT but they are only working on his back/pain.  Sent referral for PT to [facility].     2.  Chronic low back pain  -On chronic Dilaudid.  PDMP reviewed.  Uses number 150/month.  I do have concerns about this in his age group with the diagnosis of Parkinson's disease.  Long discussion with him about this.   He is sleeping all day per wife and she agrees that it is likely that narcotics.    3.  eds  -Continue to suspect related to chronic narcotic therapy and the fact he sleeps all day and up all night (day/night reversal).    4.  Depression  -related primarily to pain issues.      Subjective:   Aaron Schmidt was seen today in follow up for Parkinsons disease.  Patient is with his wife who supplements the history.  He has had a couple falls since our last visit.  With the last one, he was getting out of the recliner and fell back into the recliner and felt he hurt his back.  He cannot remember details of any other fall.  Wife states balance isn't good.  He is in PT at [facility].  They are doing dry needling on the back.   No lightheadedness or near syncope.  Last visit, we did talk about EDS.  I thought it was related to chronic opioid use.  PDMP again reviewed.  Patient on #150 hydromorphone per month.  Current prescribed movement disorder medications: carbidopa/levodopa 25/100 tid    PREVIOUS MEDICATIONS: Sinemet  ALLERGIES:   Allergies  Allergen Reactions   Baclofen Other (See Comments)    CONFUSION, BODY TREMORS   Doxazosin Mesylate Other (See Comments)    Other reaction(s): low BP   Diazepam Other (See Comments)    "makes goofy" Other reaction(s): confusion    CURRENT MEDICATIONS:  Outpatient Encounter Medications as of 08/07/2021  Medication Sig   aspirin EC 81 MG tablet Take 81 mg by mouth daily.   carbidopa-levodopa (SINEMET IR) 25-100 MG  tablet TAKE 1 TABLET BY MOUTH 3  TIMES DAILY AT 7AM, 11AM  AND 4PM (Patient taking differently: Take 1 tablet by mouth 3 (three) times daily.)   Cholecalciferol (VITAMIN D) 2000 UNITS CAPS Take 2,000 Units by mouth daily.   docusate sodium (COLACE) 100 MG capsule Take 600 mg by mouth daily.   finasteride (PROSCAR) 5 MG tablet Take 5 mg by mouth daily.   fluocinonide cream (LIDEX) 0.86 % Apply 1 application topically daily as needed (itching).   HYDROmorphone (DILAUDID) 2 MG tablet Take 2 tablets (4 mg total) by mouth 4 (four) times daily. (Patient taking differently: Take 4 mg by mouth See admin instructions. Take 1 tab every 5 hours as needed for pain do not exceed more than 5 per day)   L-Lysine 500 MG CAPS Take 500 mg by mouth daily.   losartan (COZAAR) 25 MG tablet Take 1 tablet (25 mg total) by mouth daily.   mineral oil liquid Take 108 mLs by mouth daily in the afternoon.    Multiple Vitamin (MULTIVITAMIN WITH MINERALS) TABS tablet Take 1 tablet by mouth daily. Mature mens 50+/Unknown Strength   nortriptyline (PAMELOR) 25 MG capsule Take 25 mg by mouth at bedtime.    OVER THE COUNTER MEDICATION Take 3 tablets by mouth daily. Perdiem 15 mg   polyethylene  glycol (MIRALAX / GLYCOLAX) packet Take 17 g by mouth daily.   Potassium Chloride ER 20 MEQ TBCR Take 20 mEq by mouth daily.   pyridOXINE (VITAMIN B-6) 100 MG tablet Take 100 mg by mouth daily.   tiZANidine (ZANAFLEX) 2 MG tablet Take 4 mg by mouth 3 (three) times daily.   vitamin B-12 (CYANOCOBALAMIN) 1000 MCG tablet Take 1,000 mcg by mouth daily.    vitamin C (ASCORBIC ACID) 500 MG tablet Take 500 mg by mouth daily.   No facility-administered encounter medications on file as of 08/07/2021.    Objective:   PHYSICAL EXAMINATION:    VITALS:   Vitals:   08/07/21 1128  BP: (!) 100/56  Pulse: 92  SpO2: 97%  Weight: 152 lb 3.2 oz (69 kg)  Height: 5\' 10"  (1.778 m)     GEN:  The patient appears stated age and is in NAD. HEENT:   Normocephalic, atraumatic.  The mucous membranes are moist. The superficial temporal arteries are without ropiness or tenderness. CV:  RRR Lungs:  CTAB Neck/HEME:  There are no carotid bruits bilaterally.  Neurological examination:  Orientation: The patient is alert and oriented x3. Cranial nerves: There is good facial symmetry with facial hypomimia. The speech is fluent and clear. Soft palate rises symmetrically and there is no tongue deviation. Hearing is intact to conversational tone. Sensation: Sensation is intact to light touch throughout Motor: Strength is at least antigravity x4.  Movement examination: Tone: There is mod increased tone in the L>RUE Abnormal movements: none Coordination:  There is mild decremation with RAM's, mostly with toe and heel taps on the L Gait and Station: Patient pushes off of the chair to arise.  The patient's stride length is decreased with decreased arm on the L.   I have reviewed and interpreted the following labs independently    Chemistry      Component Value Date/Time   NA 140 09/23/2020 1920   NA 142 06/30/2019 0905   K 4.0 09/23/2020 1920   CL 105 09/23/2020 1920   CO2 28 09/23/2020 1920   BUN 18 09/23/2020 1920   BUN 23 06/30/2019 0905   CREATININE 0.87 09/23/2020 1920      Component Value Date/Time   CALCIUM 9.3 09/23/2020 1920   ALKPHOS 69 07/21/2020 2119   AST 56 (H) 07/21/2020 2119   ALT 20 07/21/2020 2119   BILITOT 1.1 07/21/2020 2119       Lab Results  Component Value Date   WBC 6.7 09/23/2020   HGB 11.4 (L) 09/23/2020   HCT 34.7 (L) 09/23/2020   MCV 96.9 09/23/2020   PLT 165 09/23/2020    Lab Results  Component Value Date   TSH 0.731 09/21/2017     Total time spent on today's visit was 25 minutes, including both face-to-face time and nonface-to-face time.  Time included that spent on review of records (prior notes available to me/labs/imaging if pertinent), discussing treatment and goals, answering patient's  questions and coordinating care.  Cc:  Javier Glazier, MD

## 2021-08-07 ENCOUNTER — Ambulatory Visit: Payer: Medicare Other | Admitting: Neurology

## 2021-08-07 ENCOUNTER — Other Ambulatory Visit: Payer: Self-pay

## 2021-08-07 ENCOUNTER — Encounter: Payer: Self-pay | Admitting: Neurology

## 2021-08-07 VITALS — BP 100/56 | HR 92 | Ht 70.0 in | Wt 152.2 lb

## 2021-08-07 DIAGNOSIS — G20 Parkinson's disease: Secondary | ICD-10-CM

## 2021-08-07 MED ORDER — CARBIDOPA-LEVODOPA 25-100 MG PO TABS: ORAL_TABLET | ORAL | 1 refills | Status: DC

## 2021-08-07 NOTE — Patient Instructions (Addendum)
Increase carbidopa/levodopa 25/100 to 2 tablets at 8am/2 tablets at noon/1 at [employer] for [employer] over Parkinson's Group November 2022  [place] over [employer] Group :   [employer] Over Parkinson's Patient Education Group will be Wednesday, November 9th-*Hybrid meting*- in person at [place] location and via [facility] at 2:00 pm.   Upcoming [employer] Meetings:  2nd Wednesdays of the month at 2 pm:  November 9th, December 14th [place] at [email or web address] if interested in participating in this online group Parkinson's Care Partners Group:    3rd Mondays, Contact Misty Paladino Atypical Parkinsonian Patient Group:   4th Wednesdays, [place] If you are interested in participating in these online groups with Misty, please contact her directly for how to join those meetings.  Her contact information is [email or web address].   [place]:  [email or web address] PD Health at Home continues:  Mindfulness Mondays, Expert Briefing Tuesdays, Wellness Wednesdays, Take Time Thursdays, Fitness Fridays -Listings for June 2022 are on the website Upcoming Webinar:  Expert Briefing:  Let's Talk about Dementia.  Wednesday, November 2nd  at 1 pm. Register for Armed forces operational officer) at [email or web address]  Please check out their website to sign up for emails and see their full online offerings  [place]:  [email or web address]  Upcoming Webinar:   2022 in Review:  Progress Toward Better Treatment and Prevention.  Thursday, November 17th at 12 noon Check out additional information on their website to see their full online offerings  [place]:  [email or web address] Upcoming Webinar:  NOH (Neurogenic Orthostatic Hypotension):  What it is, How to Know if you Have it, and How to Manage it.   Tuesday, November 15th at 3 pm.  Webinar Series:  Living with Parkinson's Meetup.   Third Thursdays of each month at 3 pm; next one is November 17th Care Partner Monthly Meetup.  With Robin Searing Phinney.  First Tuesday of each month, 2 pm Joy Breaks:  First Wednesday of each month, 2-3 pm. There will be art, doodling, making, crafting, listening, laughing, stories, and everything in between. No art experience necessary. No supplies required. Just show up for joy!  Register on their website. Check out additional information to Live Well Today on their website  Parkinson and Movement Disorders (PMD) Alliance:  www.[email or web address] [employer] Online:  Online Education Events Sign up for emails, which are sent weekly to give you updates on programming and online offerings  Parkinson's Association of the [place]:  www.[email or web address] Information on online support groups, education events, and online exercises including Yoga, Parkinson's exercises and more-LOTS of information on links to PD resources and online events Virtual Support Group through Parkinson's Association of the [place]; next one is scheduled for Wednesday, November 2nd  at 2 pm.  (These are typically scheduled for the 1st Wednesday of the month at 2 pm).  Visit website for details.  Additional links for movement activities: Parkinson's DRUMMING Classes/Music Therapy with Doylene Canning:  This is a returning class and it's FREE!  2nd Mondays, continuing November 14th.  Contact *Misty Taylor-Paladino at [email or web address] or Doylene Canning at [phone] or [email or web address]  PWR! Moves Classes at [place].  Wednesdays 10 and 11 am.  Contact Amy Marriott, PT [email or web address] if interested. Here is a link to the PWR!Moves classes on Zoom from [place] - Daily Mon-Sat at 10:00. Via Zoom, FREE and open to all.  There is also a link below via Facebook  if you  use that platform. [email or web address] Parkinson's Wellness Recovery (PWR! Moves)  www.[email or web address] Info on the PWR! Virtual Experience:  You will have access to our expertise through self-assessment, guided plans that start with the PD-specific fundamentals, educational content, tips, Q&A with an expert, and a growing Art therapist of PD-specific pre-recorded and live exercise classes of varying types and intensity - both physical and cognitive! If that is not enough, we offer 1:1 wellness consultations (in-person or virtual) to personalize your PWR! Research scientist (medical).  [place] Fridays:  As part of the PD Health @ Home program, this free video series focuses each week on one aspect of fitness designed to support people living with Parkinson's.  These weekly videos highlight the [place] recent fitness guidelines for people with Parkinson's disease.  [employer]ollywood[employer]dk Dance for PD website is offering free, live-stream classes throughout the week, as well as links to [employer] of classes:  [email or web address] Dance for Parkinson's Class:  [place].  Free offering for people with Parkinson's and care partners; virtual class.  For more information, contact [phone] or email Ruffin Frederick at [email or web address] Virtual dance and Pilates for Parkinson's classes: Click on the Community Tab> Parkinson's Movement Initiative Tab.  To register for classes and for more information, visit [email or web address] and click the "community" tab.  [employer] Parkinson's Cycling Classes  [employer]: 1pm on Fridays-Live classes at [employer] ([employer] at  [email or web address] or [phone]) [employer]: Virtual Classes Mondays and Thursdays Jeanette Caprice classes Tuesday, Wednesday and Thursday (contact [place] at [email or web address]  or [phone]) [place] Varied levels of classes are offered Mondays, Tuesdays and Thursdays at [employer].  To observe a class or for more information, call [phone] or email [email or web address] [employer]: Online Caregiver Education Opportunities:  [email or web address]-[email or web address]/[email or web address]/[email or web address]-group.  You may also contact Vickki Muff at [email or web address] or [phone].   *Multiple opportunities below, as November is Caregiver Month!* A Guide to Physical and Mental Fitness for Family Caregivers.  Wednesday, November 9th, 12:30-2 pm at [facility] of You!  Tuesday, November 15th, 11:30-12:45.  [facility] [place] 03-30-1992 Understanding Care Options and Advanced Care Planning.  Monday, November 21st, 4:30-5:45.  [place], [place] above to register. Well-Spring Navigator:  [address] program, a free service to help individuals and families through the journey of determining care for older adults.  The "Navigator" is a [employer], Education officer, museum, who will speak with a prospective client and/or loved ones to provide an assessment of the situation and a set of recommendations for a personalized care plan -- all free of charge, and whether [facility] [employer] offers the needed service or not. If the need is not a service we provide, we are well-connected with reputable programs in town that we can refer you to.  www.well-[email or web address] or to speak with the Navigator, call [phone].

## 2021-08-09 ENCOUNTER — Ambulatory Visit: Payer: Medicare Other | Admitting: Physical Therapy

## 2021-08-09 ENCOUNTER — Other Ambulatory Visit: Payer: Self-pay

## 2021-08-09 DIAGNOSIS — M6281 Muscle weakness (generalized): Secondary | ICD-10-CM

## 2021-08-09 DIAGNOSIS — R293 Abnormal posture: Secondary | ICD-10-CM

## 2021-08-09 DIAGNOSIS — M546 Pain in thoracic spine: Secondary | ICD-10-CM | POA: Diagnosis not present

## 2021-08-09 DIAGNOSIS — M545 Low back pain, unspecified: Secondary | ICD-10-CM

## 2021-08-09 DIAGNOSIS — M62838 Other muscle spasm: Secondary | ICD-10-CM

## 2021-08-09 DIAGNOSIS — G8929 Other chronic pain: Secondary | ICD-10-CM

## 2021-08-09 DIAGNOSIS — R2689 Other abnormalities of gait and mobility: Secondary | ICD-10-CM

## 2021-08-09 NOTE — Therapy (Signed)
[facility] [address]  [place], [place], [address] Phone: [phone]   Fax:  [phone]  Physical Therapy Treatment / Discharge Summary  Patient Details  Name: Aaron Schmidt MRN: [id number] Date of Birth: 01/01/1933 Referring Provider (PT): Orma Render, MD   Encounter Date: 08/09/2021   PT End of Session - 08/09/21 1104     Visit Number 3    Number of Visits 4    Date for PT Re-Evaluation 08/11/21    Authorization Type [employer] Medicare    PT Start Time 1104    PT Stop Time 1150    PT Time Calculation (min) 46 min    Activity Tolerance Patient tolerated treatment well    Behavior During Therapy [facility] for tasks assessed/performed             Past Medical History:  Diagnosis Date   Acute kidney injury superimposed on chronic kidney disease ([place]) 09/05/2017   Anemia 09/21/2017   Anemia due to chronic blood loss 01/10/2021   Arthritis    Degeneration spine & stenosis   Benign prostatic hyperplasia 01/10/2021   Benign prostatic hyperplasia with lower urinary tract symptoms 01/10/2021   Body mass index (BMI) 25.0-25.9, adult 08/31/2019   CAD (coronary artery disease) 04/20/2019   Cervical myelopathy with cervical radiculopathy ([place]) 06/24/2014   Cervical spondylosis without myelopathy 05/12/2014   Chronic bilateral low back pain 07/26/2020   Chronic kidney disease, stage 3 unspecified ([place]) 01/10/2021   Chronic low back pain 07/29/2013   Chronic pain 09/05/2017   Degenerative cervical spinal stenosis 05/26/2014   Degenerative lumbar spinal stenosis 09/22/2019   Displacement of cervical intervertebral disc 07/19/2014   Edema 01/10/2021   Elevated troponin 09/05/2017   Empyema lung ([place]) 09/09/2017   Enlarged prostate 01/10/2021   Essential hypertension 08/29/2010   Qualifier: Diagnosis of  By: Aaron Soho MD, Leanna Sato     Fall in home 07/10/2017   Hardening of the aorta (main artery of the heart) ([place]) 01/10/2021    History of kidney stones    Hyperlipidemia 06/21/2014   Hypertension    has a histroy of   Hypoalbuminemia 01/10/2021   Hyponatremia 09/05/2017   Hypotension    Impaired fasting glucose 01/10/2021   Intermittent tremor 01/10/2021   Iron deficiency anemia 01/10/2021   Kidney stone 01/10/2021   LBBB (left bundle branch block) 06/21/2014   Lumbar post-laminectomy syndrome 07/15/2013   Lumbar radiculopathy 07/29/2013   Lumbar stenosis with neurogenic claudication 08/31/2019   Malnutrition of moderate degree 03/23/2019   Mitral valve disorder 01/10/2021   Murmur 01/08/2017   Muscle pain 03/01/2020   Myocardial infarction ([place]) 03/21/2019   Neck pain 03/03/2014   Normochromic normocytic anemia 09/05/2017   NSTEMI (non-ST elevated myocardial infarction) ([place]) 03/21/2019   Obesity 04/06/2019   Obstructive sleep apnea 09/30/2010   PSG (180 lbs ) >> severe obstructive sleep apnea with AHi 38/h & desatns to 82%  Started on auto 5-15  >> changed to 8 cm Download reviewed 1/21 - 11/23/10 >> residual events of 6/h , avg pr 8 cm, leak + with nasal pillows     Occasional tremors 07/26/2020   Old myocardial infarction 01/10/2021   Osteoarthrosis 01/10/2021   Osteopenia 01/10/2021   Other specified disorders of kidney and ureter 01/10/2021   Parkinson's disease ([place])    Peripheral edema 01/10/2021   Peripheral neuralgia 01/10/2021   Personal history of colonic polyps 01/10/2021   Personal history of other diseases  of the respiratory system 01/10/2021   Pleural effusion on left 08/19/2017   Acute symptoms since 08/13/17 > CTa  08/13/17  neg pe/ extensively loculated effusion - L thoracentesis 08/20/2017 :  220 cc with glucose < 20 and WBC 5,338 mostly Poly, LDH 688 > cyt neg >Referrd to T surgery > VATS 09/09/17 c/w empyema   Pleural effusion, left 09/04/2017   Preoperative cardiovascular examination 06/21/2014   Protein-calorie malnutrition, severe 09/11/2017   Pure hypercholesterolemia 01/10/2021   Sciatica 01/10/2021    Scoliosis concern 08/31/2019   Sensorineural hearing loss (SNHL) of both ears 06/15/2020   Last Assessment & Plan:  Formatting of this note might be different from the original. Concern over worsening hearing. Chronic history of sensorineural hearing loss bilaterally.  Has hearing aids currently.  He feels his hearing aids are not working as well as they historically have.  Recently had them checked. EXAM shows normal external canals and tympanic membranes bilaterally. AUDIOGRAM Shows B   Shoulder joint pain 12/01/2013   Sinusitis, chronic 08/19/2017   CT sinus  08/20/2017 >>> Normally aerated paranasal sinuses.  Patent sinus drainage pathways.   SIRS (systemic inflammatory response syndrome) ([place]) 09/05/2017   Skin ulcer of toe of left foot, limited to breakdown of skin ([place]) 09/04/2016   Sleep apnea 2012   used CPAP 2 yrs. ago, feels he sleeps better w/o, no longer using    Spinal stenosis of lumbar region 08/10/2013   Spinal stenosis, lumbar region without neurogenic claudication 08/19/2019   Symptomatic anemia 09/20/2017   Thrombocytopenia ([place]) 01/10/2021   Tubular adenoma of colon 01/10/2021   Unspecified abnormal finding in specimens from other organs, systems and tissues 01/10/2021    Past Surgical History:  Procedure Laterality Date   ANTERIOR LAT LUMBAR FUSION N/A 09/22/2019   Procedure: Lumbar one-two, Lumbar two-three Anterolateral lumbar interbody fusion with pedicle screw fixation and exploration of adjacent fusion;  Surgeon: Erline Levine, MD;  Location: [facility];  Service: Neurosurgery;  Laterality: N/A;   ARM NEUROPLASTY     at 14 yrs. of age- fell off tractor- had fracture & repair *& later- 1990's had  transplantation of a nerve at the elbow   BACK SURGERY     x4 back surgery x2 fusion -   CARDIAC CATHETERIZATION  03/23/2019   CARPAL TUNNEL RELEASE Right    COLONOSCOPY     ESOPHAGOGASTRODUODENOSCOPY (EGD) WITH PROPOFOL N/A 09/23/2017   Procedure: ESOPHAGOGASTRODUODENOSCOPY  (EGD) WITH PROPOFOL;  Surgeon: Laurence Spates, MD;  Location: [facility];  Service: Endoscopy;  Laterality: N/A;   LEFT HEART CATH AND CORONARY ANGIOGRAPHY N/A 03/23/2019   Procedure: LEFT HEART CATH AND CORONARY ANGIOGRAPHY;  Surgeon: Burnell Blanks, MD;  Location: [facility];  Service: Cardiovascular;  Laterality: N/A;   LUMBAR PERCUTANEOUS PEDICLE SCREW 2 LEVEL N/A 09/22/2019   Procedure: LUMBAR PERCUTANEOUS PEDICLE SCREW TWO LEVEL;  Surgeon: Erline Levine, MD;  Location: [facility];  Service: Neurosurgery;  Laterality: N/A;   PLEURAL EFFUSION DRAINAGE Left 09/09/2017   Procedure: DRAINAGE OF PLEURAL EFFUSION;  Surgeon: [name], MD;  Location: [facility];  Service: Thoracic;  Laterality: Left;   POSTERIOR CERVICAL FUSION/FORAMINOTOMY N/A 06/24/2014   Procedure: Posterior Cervical Three-Seven Fusion with Lateral Mass Fixation;  Surgeon: Erline Levine, MD;  Location: [facility];  Service: Neurosurgery;  Laterality: N/A;  C3-C7 posterior cervical fusion with lateral mass fixation   SHOULDER SURGERY Bilateral    VIDEO ASSISTED THORACOSCOPY (VATS)/DECORTICATION Left 09/09/2017   Procedure: VIDEO ASSISTED  THORACOSCOPY (VATS)/DECORTICATION;  Surgeon: [name], MD;  Location: [facility];  Service: Thoracic;  Laterality: Left;   VIDEO BRONCHOSCOPY  09/09/2017   Procedure: VIDEO BRONCHOSCOPY;  Surgeon: [name], MD;  Location: [facility];  Service: Thoracic;;    There were no vitals filed for this visit.   Subjective Assessment - 08/09/21 1108     Subjective Pt saw Dr. Carles Collet on Monday and new PT referral received to address his Parkinson's - pt now in agreement to at least try PT for this so will schedule eval. He notes onoing "hot spot" pain aggravated by riding in the car as well as LBP still aggravated by bending over. He reports he is doing the HEP but not sure that it is helping.    Pertinent History Parkinson's disease, frequent falls, chronic LBP, history of  multiple back/neck surgeries and fusions.    Patient Stated Goals "to get the 'hot spot' so I can stand & sit better"    Currently in Pain? Yes    Pain Score 2     Pain Location Scapula    Pain Orientation Left    Pain Descriptors / Indicators Burning    Pain Type Acute pain;Chronic pain    Pain Frequency Intermittent    Aggravating Factors  riding in car    Pain Score 4    Pain Location Back    Pain Orientation Lower;Mid;Left    Pain Descriptors / Indicators Sharp;Jabbing    Pain Type Acute pain;Chronic pain    Pain Frequency Constant    Aggravating Factors  bending over                               [facility] Adult PT Treatment/Exercise - 08/09/21 1104       Lumbar Exercises: Stretches   Quadruped Mid Back Stretch 2 reps;30 seconds    Quadruped Mid Back Stretch Limitations seated 3-way prayer stretch with cane      Lumbar Exercises: Seated   Other Seated Lumbar Exercises B trunk rotation with arms crossed over chest x 5    Other Seated Lumbar Exercises Abdominal bracing pressing hands into pillow on lap 10 x 5" - cues to increase hold time focusing on abd activation w/o holding his breath      Shoulder Exercises: Seated   Extension Both;10 reps;Strengthening;Theraband    Theraband Level (Shoulder Extension) Level 1 (Yellow)    Extension Limitations cues for scap retraction and abdominal bracing with repeated cues to slow pace for better control and muscle activation    Retraction Both;10 reps;AROM;Strengthening    Retraction Limitations scap retraction & depression w/o resistance    Row Both;10 reps;Strengthening;Theraband    Theraband Level (Shoulder Row) Level 1 (Yellow)    Row Limitations cues for scap retraction and abdominal bracing with repeated cues to slow pace for better control and muscle activation      Shoulder Exercises: Standing   Extension Both;10 reps;Strengthening;Theraband    Theraband Level (Shoulder Extension) Level 1 (Yellow)     Extension Limitations VC & TC for scap retraction and abdominal bracing with repeated cues to slow pace for better control and muscle activation    Row Both;10 reps;Strengthening;Theraband    Theraband Level (Shoulder Row) Level 2 (Red)    Row Limitations VC & TC for scap retraction and abdominal bracing with repeated cues to slow pace for better control and muscle activation      Manual  Therapy   Manual Therapy Soft tissue mobilization;Myofascial release    Manual therapy comments skilled palpation and monitoring during DN   pt positioned in sitting leaning foward over elevated hi/low table with pillows for comfort/positioning   Soft tissue mobilization STM/DTM to L thoracic paraspinals, rhomboids, middle/lower traps, infraspinatus, subscapularis, teres group & lats    Myofascial Release manual TPR  to L rhomboids, middle/lower traps, infraspinatus, subscapularis, teres group & lats              Trigger Point Dry Needling - 08/09/21 1104     Consent Given? Yes    Muscles Treated Upper Quadrant Infraspinatus;Subscapularis;Latissimus dorsi;Teres major;Teres minor;Middle trapezius;Lower trapezius   Left   Dry Needling Comments pt positioned in sitting leaning foward over elevated hi/low table with pillows for comfort/positioning    Infraspinatus Response Twitch response elicited;Palpable increased muscle length   lower infraspinatus taking care to avoid cyst   Subscapularis Response Twitch response elicited;Palpable increased muscle length    Latissimus dorsi Response Twitch response elicited;Palpable increased muscle length    Teres major Response Twitch response elicited;Palpable increased muscle length    Teres minor Response Twitch response elicited;Palpable increased muscle length    Lower trapezius Response Twitch response elicited;Palpable increased muscle length    Middle trapezius Response Twitch response elicited;Palpable increased muscle length                         PT Long Term Goals - 08/09/21 1125       PT LONG TERM GOAL #1   Title Patient will be independent with ongoing/advanced HEP for self-management at home    Status Partially Met   08/09/21 - cues needed for pacing to properly engage all muscles     PT LONG TERM GOAL #2   Title Patient to report reduction in frequency and intensity of thoracolumbar back pain by >/= 50% to allow for improved QOL    Status Not Met   08/09/21 - pt reporting 20% improvement in pain     PT LONG TERM GOAL #3   Title Patient will report ability to sit for up to 1 hr w/o limitation due to back pain to allow patient to sit through meals at [facility]    Status Achieved   08/09/21 - able to sit for 1-1.5 hrs     PT LONG TERM GOAL #4   Title Patient will be able to transition sit <> stand w/o limitation due to back pain >/= 4/10    Status Partially Met   08/09/21 - pain up to 5/10 pain with sit to stand, bettter when he scoots to edge of seat before attempting to stand                  Plan - 08/09/21 1150     Clinical Impression Statement Ibraham reports 20% reduction in pain since start of PT but still has brief occurrences of sharp jabbing pain with some activities and movements. He remains predominantly sedentary and admits to limited performance of HEP due to pain but when HEP reviewed he can perform exercises w/o increased pain when using proper technique including engaging abdominal muscles to provide core stability, with pain only occurring when he starts to rush through the movements and does not properly engage the targeted and stabilizing muscles. Progress observed toward goals, but only some of goals met or partially met at this time. Correll' thoracolumbar will likely benefit more from mobility and exercises  to reduce some of his stiffness and rigidity from his Parkinson's disease. Given plan to proceed with PT eval for Parkinson's disease, will discharge from PT for this episode but  will incorporate exercises, manual therapy and modalities for thoracolumbar back pain into Parkinson's episode as indicated.    Comorbidities Parkinson's, arthritis, chronic pain, multiple back surgeries/fusions, CKD, falls    Rehab Potential Fair    PT Treatment/Interventions ADLs/Self Care Home Management;Cryotherapy;Electrical Stimulation;Iontophoresis 30m/ml Dexamethasone;Moist Heat;Ultrasound;DME Instruction;Gait training;Stair training;Functional mobility training;Therapeutic activities;Therapeutic exercise;Balance training;Neuromuscular re-education;Patient/family education;Manual techniques;Passive range of motion;Dry needling;Energy conservation;Taping;Spinal Manipulations;Joint Manipulations    PT Next Visit Plan Discharge from PT for current episode; PT eval for Parkinson's disease pending & will incorporate exercises, manual therapy and modalities for thoracolumbar back pain into Parkinson's episode as indicated.    PT Home Exercise Plan Access Code: [email or web address])    Consulted and Agree with Plan of Care Patient             Patient will benefit from skilled therapeutic intervention in order to improve the following deficits and impairments:  Abnormal gait, Cardiopulmonary status limiting activity, Decreased activity tolerance, Decreased balance, Decreased coordination, Decreased endurance, Decreased knowledge of use of DME, Decreased mobility, Decreased range of motion, Decreased strength, Difficulty walking, Hypomobility, Increased fascial restricitons, Increased muscle spasms, Impaired perceived functional ability, Impaired flexibility, Improper body mechanics, Postural dysfunction, Pain  Visit Diagnosis: Pain in thoracic spine  Chronic right-sided low back pain without sciatica  Other muscle spasm  Abnormal posture  Muscle weakness (generalized)  Other abnormalities of gait and mobility     Problem List Patient Active Problem List   Diagnosis Date Noted   Mitral  regurgitation 01/12/2021   Anemia due to chronic blood loss 01/10/2021   Benign prostatic hyperplasia 01/10/2021   Benign prostatic hyperplasia with lower urinary tract symptoms 01/10/2021   Chronic kidney disease, stage 3 unspecified ([place] 01/10/2021   Peripheral edema 01/10/2021   Enlarged prostate 01/10/2021   Hardening of the aorta (main artery of the heart) ([place] 01/10/2021   Hypoalbuminemia 01/10/2021   Impaired fasting glucose 01/10/2021   Iron deficiency anemia 01/10/2021   Kidney stone 01/10/2021   Mitral valve disorder 01/10/2021   Osteopenia 01/10/2021   Other specified disorders of kidney and ureter 01/10/2021   Peripheral neuralgia 01/10/2021   Sciatica 01/10/2021   Personal history of colonic polyps 01/10/2021   Personal history of other diseases of the respiratory system 01/10/2021   Pure hypercholesterolemia 01/10/2021   Thrombocytopenia ([place] 01/10/2021   Tubular adenoma of colon 01/10/2021   Unspecified abnormal finding in specimens from other organs, systems and tissues 01/10/2021   Edema 01/10/2021   Old myocardial infarction 01/10/2021   Osteoarthrosis 01/10/2021   Intermittent tremor 01/10/2021   Arthritis    History of kidney stones    Hypertension    Parkinson's disease ([place]    Occasional tremors 07/26/2020   Chronic bilateral low back pain 07/26/2020   Sensorineural hearing loss (SNHL) of both ears 06/15/2020   Muscle pain 03/01/2020   Degenerative lumbar spinal stenosis 09/22/2019   Body mass index (BMI) 25.0-25.9, adult 08/31/2019   Scoliosis concern 08/31/2019   Lumbar stenosis with neurogenic claudication 08/31/2019   Spinal stenosis, lumbar region without neurogenic claudication 08/19/2019   Hypotension 06/23/2019   Myocarditis ([place] 05/19/2019   CAD (coronary artery disease) 04/20/2019   Obesity 04/06/2019   Malnutrition of moderate degree 03/23/2019   NSTEMI (non-ST elevated myocardial infarction) ([place] 03/21/2019   Myocardial infarction  ([place] 03/21/2019  Anemia 09/21/2017   Symptomatic anemia 09/20/2017   Protein-calorie malnutrition, severe 09/11/2017   Empyema lung ([place]) 09/09/2017   SIRS (systemic inflammatory response syndrome) ([place]) 09/05/2017   Elevated troponin 09/05/2017   Normochromic normocytic anemia 09/05/2017   Hyponatremia 09/05/2017   Acute kidney injury superimposed on chronic kidney disease ([place]) 09/05/2017   Chronic pain 09/05/2017   Pleural effusion, left 09/04/2017   Pleural effusion on left 08/19/2017   Sinusitis, chronic 08/19/2017   Fall in home 07/10/2017   Murmur 01/08/2017   Skin ulcer of toe of left foot, limited to breakdown of skin ([place]) 09/04/2016   Displacement of cervical intervertebral disc 07/19/2014   Cervical myelopathy with cervical radiculopathy ([facility]) 06/24/2014   LBBB (left bundle branch block) 06/21/2014   Hyperlipidemia 06/21/2014   Preoperative cardiovascular examination 06/21/2014   Degenerative cervical spinal stenosis 05/26/2014   Cervical spondylosis without myelopathy 05/12/2014   Neck pain 03/03/2014   Shoulder joint pain 12/01/2013   Spinal stenosis of lumbar region 08/10/2013   Chronic low back pain 07/29/2013   Lumbar radiculopathy 07/29/2013   Lumbar post-laminectomy syndrome 07/15/2013   Obstructive sleep apnea 09/30/2010   Sleep apnea 2012   Essential hypertension 08/29/2010    PHYSICAL THERAPY DISCHARGE SUMMARY  Visits from Start of Care: 3  Current functional level related to goals / functional outcomes:   Refer to above clinical impression.    Remaining deficits:   Chronic thoracolumbar back pain with impaired mobility and sedentary lifestyle.   Mobility and gait deficits related to Parkinson's disease with associated high fall risk - PT eval pending from referral from Dr. Wells Guiles Tat.   Education / Equipment:   HEP, Biomedical scientist education   Patient agrees to discharge. Patient goals were partially met. Patient is being  discharged due to  new referral with PT eval pending for Parkinson's disease.   Percival Spanish, PT 08/09/2021, 12:51 PM  [facility] [address]  [place], [place], [address] Phone: [phone]   Fax:  [phone]  Name: ANURAG SCARFO MRN: [id number] Date of Birth: 1933-08-14

## 2021-08-21 ENCOUNTER — Ambulatory Visit (HOSPITAL_BASED_OUTPATIENT_CLINIC_OR_DEPARTMENT_OTHER)
Admission: RE | Admit: 2021-08-21 | Discharge: 2021-08-21 | Disposition: A | Discharge status: Home / Self Care | Payer: Medicare Other | Source: Ambulatory Visit | Attending: Cardiology | Admitting: Cardiology

## 2021-08-21 ENCOUNTER — Other Ambulatory Visit: Payer: Self-pay

## 2021-08-21 DIAGNOSIS — I447 Left bundle-branch block, unspecified: Secondary | ICD-10-CM | POA: Insufficient documentation

## 2021-08-21 DIAGNOSIS — I340 Nonrheumatic mitral (valve) insufficiency: Secondary | ICD-10-CM | POA: Diagnosis present

## 2021-08-21 DIAGNOSIS — I10 Essential (primary) hypertension: Secondary | ICD-10-CM | POA: Diagnosis present

## 2021-08-21 DIAGNOSIS — I2510 Atherosclerotic heart disease of native coronary artery without angina pectoris: Secondary | ICD-10-CM | POA: Insufficient documentation

## 2021-08-21 DIAGNOSIS — G20 Parkinson's disease: Secondary | ICD-10-CM | POA: Diagnosis present

## 2021-08-21 LAB — ECHOCARDIOGRAM COMPLETE
AR max vel: 3.42 cm2
AV Area VTI: 2.96 cm2
AV Area mean vel: 3.49 cm2
AV Mean grad: 3 mmHg
AV Peak grad: 5 mmHg
Ao pk vel: 1.12 m/s
Area-P 1/2: 2.21 cm2
Calc EF: 66.6 %
MV VTI: 2.15 cm2
S' Lateral: 3.1 cm
Single Plane A2C EF: 66.1 %
Single Plane A4C EF: 66.8 %

## 2021-08-23 ENCOUNTER — Telehealth: Payer: Self-pay | Admitting: Cardiology

## 2021-08-23 NOTE — Telephone Encounter (Signed)
Patient wife informed of results per dpr.

## 2021-08-23 NOTE — Telephone Encounter (Signed)
Patient's wife returned call for test results.   °

## 2021-09-22 ENCOUNTER — Ambulatory Visit: Payer: Medicare Other | Attending: Neurology | Admitting: Physical Therapy

## 2021-09-22 ENCOUNTER — Encounter: Payer: Self-pay | Admitting: Physical Therapy

## 2021-09-22 ENCOUNTER — Other Ambulatory Visit: Payer: Self-pay

## 2021-09-22 DIAGNOSIS — M6281 Muscle weakness (generalized): Secondary | ICD-10-CM | POA: Diagnosis present

## 2021-09-22 DIAGNOSIS — R2689 Other abnormalities of gait and mobility: Secondary | ICD-10-CM | POA: Insufficient documentation

## 2021-09-22 DIAGNOSIS — G20 Parkinson's disease: Secondary | ICD-10-CM | POA: Insufficient documentation

## 2021-09-22 DIAGNOSIS — R293 Abnormal posture: Secondary | ICD-10-CM | POA: Insufficient documentation

## 2021-09-22 DIAGNOSIS — R2681 Unsteadiness on feet: Secondary | ICD-10-CM | POA: Insufficient documentation

## 2021-09-22 NOTE — Therapy (Signed)
[facility] [address]  [place], [place], [address] Phone: [phone]   Fax:  [phone]  Physical Therapy Evaluation  Patient Details  Name: Aaron Schmidt MRN: [id number] Date of Birth: [birth year] Referring Provider (PT): Alonza Bogus, DO   Encounter Date: 09/22/2021   PT End of Session - 09/22/21 1024     Visit Number 1    Number of Visits 12    Date for PT Re-Evaluation 11/03/21    Authorization Type [employer] Medicare    PT Start Time 1024   Pt arrived late   PT Stop Time 1110    PT Time Calculation (min) 46 min    Activity Tolerance Patient tolerated treatment well    Behavior During Therapy [facility] for tasks assessed/performed             Past Medical History:  Diagnosis Date   Acute kidney injury superimposed on chronic kidney disease ([place]) 09/05/2017   Anemia 09/21/2017   Anemia due to chronic blood loss 01/10/2021   Arthritis    Degeneration spine & stenosis   Benign prostatic hyperplasia 01/10/2021   Benign prostatic hyperplasia with lower urinary tract symptoms 01/10/2021   Body mass index (BMI) 25.0-25.9, adult 08/31/2019   CAD (coronary artery disease) 04/20/2019   Cervical myelopathy with cervical radiculopathy ([place]) 06/24/2014   Cervical spondylosis without myelopathy 05/12/2014   Chronic bilateral low back pain 07/26/2020   Chronic kidney disease, stage 3 unspecified ([place]) 01/10/2021   Chronic low back pain 07/29/2013   Chronic pain 09/05/2017   Degenerative cervical spinal stenosis 05/26/2014   Degenerative lumbar spinal stenosis 09/22/2019   Displacement of cervical intervertebral disc 07/19/2014   Edema 01/10/2021   Elevated troponin 09/05/2017   Empyema lung ([place]) 09/09/2017   Enlarged prostate 01/10/2021   Essential hypertension 08/29/2010   Qualifier: Diagnosis of  By: Elsworth Soho MD, Leanna Sato     Fall in home 07/10/2017   Hardening of the aorta (main artery of the heart) ([place]) 01/10/2021   History  of kidney stones    Hyperlipidemia 06/21/2014   Hypertension    has a histroy of   Hypoalbuminemia 01/10/2021   Hyponatremia 09/05/2017   Hypotension    Impaired fasting glucose 01/10/2021   Intermittent tremor 01/10/2021   Iron deficiency anemia 01/10/2021   Kidney stone 01/10/2021   LBBB (left bundle branch block) 06/21/2014   Lumbar post-laminectomy syndrome 07/15/2013   Lumbar radiculopathy 07/29/2013   Lumbar stenosis with neurogenic claudication 08/31/2019   Malnutrition of moderate degree 03/23/2019   Mitral valve disorder 01/10/2021   Murmur 01/08/2017   Muscle pain 03/01/2020   Myocardial infarction ([place]) 03/21/2019   Neck pain 03/03/2014   Normochromic normocytic anemia 09/05/2017   NSTEMI (non-ST elevated myocardial infarction) ([place]) 03/21/2019   Obesity 04/06/2019   Obstructive sleep apnea 09/30/2010   PSG (180 lbs ) >> severe obstructive sleep apnea with AHi 38/h & desatns to 82%  Started on auto 5-15  >> changed to 8 cm Download reviewed 1/21 - 11/23/10 >> residual events of 6/h , avg pr 8 cm, leak + with nasal pillows     Occasional tremors 07/26/2020   Old myocardial infarction 01/10/2021   Osteoarthrosis 01/10/2021   Osteopenia 01/10/2021   Other specified disorders of kidney and ureter 01/10/2021   Parkinson's disease ([place])    Peripheral edema 01/10/2021   Peripheral neuralgia 01/10/2021   Personal history of colonic polyps 01/10/2021   Personal history of other  diseases of the respiratory system 01/10/2021   Pleural effusion on left 08/19/2017   Acute symptoms since 08/13/17 > CTa  08/13/17  neg pe/ extensively loculated effusion - L thoracentesis 08/20/2017 :  220 cc with glucose < 20 and WBC 5,338 mostly Poly, LDH 688 > cyt neg >Referrd to T surgery > VATS 09/09/17 c/w empyema   Pleural effusion, left 09/04/2017   Preoperative cardiovascular examination 06/21/2014   Protein-calorie malnutrition, severe 09/11/2017   Pure hypercholesterolemia 01/10/2021   Sciatica 01/10/2021   Scoliosis  concern 08/31/2019   Sensorineural hearing loss (SNHL) of both ears 06/15/2020   Last Assessment & Plan:  Formatting of this note might be different from the original. Concern over worsening hearing. Chronic history of sensorineural hearing loss bilaterally.  Has hearing aids currently.  He feels his hearing aids are not working as well as they historically have.  Recently had them checked. EXAM shows normal external canals and tympanic membranes bilaterally. AUDIOGRAM Shows B   Shoulder joint pain 12/01/2013   Sinusitis, chronic 08/19/2017   CT sinus  08/20/2017 >>> Normally aerated paranasal sinuses.  Patent sinus drainage pathways.   SIRS (systemic inflammatory response syndrome) ([place]) 09/05/2017   Skin ulcer of toe of left foot, limited to breakdown of skin ([place]) 09/04/2016   Sleep apnea 2012   used CPAP 2 yrs. ago, feels he sleeps better w/o, no longer using    Spinal stenosis of lumbar region 08/10/2013   Spinal stenosis, lumbar region without neurogenic claudication 08/19/2019   Symptomatic anemia 09/20/2017   Thrombocytopenia ([place]) 01/10/2021   Tubular adenoma of colon 01/10/2021   Unspecified abnormal finding in specimens from other organs, systems and tissues 01/10/2021    Past Surgical History:  Procedure Laterality Date   ANTERIOR LAT LUMBAR FUSION N/A 09/22/2019   Procedure: Lumbar one-two, Lumbar two-three Anterolateral lumbar interbody fusion with pedicle screw fixation and exploration of adjacent fusion;  Surgeon: Erline Levine, MD;  Location: [facility];  Service: Neurosurgery;  Laterality: N/A;   ARM NEUROPLASTY     at 14 yrs. of age- fell off tractor- had fracture & repair *& later- 1990's had  transplantation of a nerve at the elbow   BACK SURGERY     x4 back surgery x2 fusion -   CARDIAC CATHETERIZATION  03/23/2019   CARPAL TUNNEL RELEASE Right    COLONOSCOPY     ESOPHAGOGASTRODUODENOSCOPY (EGD) WITH PROPOFOL N/A 09/23/2017   Procedure: ESOPHAGOGASTRODUODENOSCOPY (EGD) WITH  PROPOFOL;  Surgeon: Laurence Spates, MD;  Location: [facility];  Service: Endoscopy;  Laterality: N/A;   LEFT HEART CATH AND CORONARY ANGIOGRAPHY N/A 03/23/2019   Procedure: LEFT HEART CATH AND CORONARY ANGIOGRAPHY;  Surgeon: Burnell Blanks, MD;  Location: [facility];  Service: Cardiovascular;  Laterality: N/A;   LUMBAR PERCUTANEOUS PEDICLE SCREW 2 LEVEL N/A 09/22/2019   Procedure: LUMBAR PERCUTANEOUS PEDICLE SCREW TWO LEVEL;  Surgeon: Erline Levine, MD;  Location: [facility];  Service: Neurosurgery;  Laterality: N/A;   PLEURAL EFFUSION DRAINAGE Left 09/09/2017   Procedure: DRAINAGE OF PLEURAL EFFUSION;  Surgeon: [name], MD;  Location: [facility];  Service: Thoracic;  Laterality: Left;   POSTERIOR CERVICAL FUSION/FORAMINOTOMY N/A 06/24/2014   Procedure: Posterior Cervical Three-Seven Fusion with Lateral Mass Fixation;  Surgeon: Erline Levine, MD;  Location: [facility];  Service: Neurosurgery;  Laterality: N/A;  C3-C7 posterior cervical fusion with lateral mass fixation   SHOULDER SURGERY Bilateral    VIDEO ASSISTED THORACOSCOPY (VATS)/DECORTICATION Left 09/09/2017   Procedure: VIDEO  ASSISTED THORACOSCOPY (VATS)/DECORTICATION;  Surgeon: [name], MD;  Location: [facility];  Service: Thoracic;  Laterality: Left;   VIDEO BRONCHOSCOPY  09/09/2017   Procedure: VIDEO BRONCHOSCOPY;  Surgeon: [name], MD;  Location: [facility];  Service: Thoracic;;    There were no vitals filed for this visit.    Subjective Assessment - 09/22/21 1029     Subjective Pt reports diagnosis of Parkinson's disease ~1 yr ago. His biggest concern is related to his balance but also notes tremor in his L leg. Only 1 fall in the past 6 months. Uses rollator with ambulating around [place] but presents to PT with SPC.    Pertinent History Parkinson's disease, frequent falls, chronic LBP, history of multiple back/neck surgeries and fusions.    How long can you walk comfortably? limited by  fatigue usually ~600 ft    Currently in Pain? Yes    Pain Score 1     Pain Location Back    Pain Orientation Lower    Pain Type Chronic pain    Pain Radiating Towards none currently but occasionally down L leg to toes    Pain Frequency Intermittent                [facility] PT Assessment - 09/22/21 1024       Assessment   Medical Diagnosis Parkinson's disease    Referring Provider (PT) Wells Guiles Tat, DO    Onset Date/Surgical Date --   diagnosis ~1 yr ago   Hand Dominance Right    Next MD Visit 02/06/22    Prior Therapy recent PT x 2 episodes for L upper and R mid/low back pain      Precautions   Precautions Fall      Restrictions   Weight Bearing Restrictions No      Balance Screen   Has the patient fallen in the past 6 months Yes    How many times? 1    Has the patient had a decrease in activity level because of a fear of falling?  No    Is the patient reluctant to leave their home because of a fear of falling?  No      Home Environment   Living Environment Private residence    Living Arrangements Spouse/significant other    Type of Home Independent living facility   [place] entry    [place] One level    [place] - 4 wheels;Walker - 2 wheels;Cane - single point;Shower seat;Grab bars - tub/shower;Grab bars - toilet      Prior Function   Level of Independence Independent;Needs assistance with ADLs   wife helps him tie his shoes   Vocation Retired    Leisure mostly sedentary      Cognition   Overall Cognitive Status Within Functional Limits for tasks assessed      Posture/Postural Control   Posture/Postural Control Postural limitations    Postural Limitations Rounded Shoulders;Increased thoracic kyphosis;Decreased lumbar lordosis;Flexed trunk      AROM   Overall AROM Comments moderately to severely limited thoracic and lumbar motion in most planes; B shoulder ROM WFL and consistent with motion at time of recent D/C form PT       Strength   Overall Strength Comments tested in sitting    Strength Assessment Site Hip;Knee;Ankle    Right/Left Hip Right;Left    Right Hip Flexion 4-/5    Right Hip Extension 4-/5    Right Hip  ABduction 4/5    Right Hip ADduction 4-/5    Left Hip Flexion 4-/5    Left Hip Extension 4-/5    Left Hip ABduction 4/5    Left Hip ADduction 4-/5    Right/Left Knee Right;Left    Right Knee Flexion 4-/5    Right Knee Extension 4/5    Left Knee Flexion 4-/5    Left Knee Extension 4-/5    Right/Left Ankle Right;Left    Right Ankle Dorsiflexion 3/5    Left Ankle Dorsiflexion 3+/5      Ambulation/Gait   Assistive device None;Straight cane;Rollator    Gait Pattern Step-through pattern;Decreased stride length;Shuffle;Decreased hip/knee flexion - right;Decreased hip/knee flexion - left;Poor foot clearance - left;Poor foot clearance - right;Trunk flexed    Ambulation Surface Level;Indoor    Gait velocity 2.22 ft/sec w/o AD, 2.18 ft/sec with SPC, 2.73 ft/sec with rollator      Standardized Balance Assessment   Standardized Balance Assessment Berg Balance Test;Timed Up and Go Test;Five Times Sit to Stand;10 meter walk test    Five times sit to stand comments  25.65 sec w/o UE assist   >16 sec indicates high risk for falls in patient's with PD   10 Meter Walk 14.75 sec w/o AD, 15.07 sec with SPC, 12.00 sec with rollator      Berg Balance Test   Sit to Stand Able to stand without using hands and stabilize independently    Standing Unsupported Able to stand safely 2 minutes    Sitting with Back Unsupported but Feet Supported on Floor or Stool Able to sit safely and securely 2 minutes    Stand to Sit Sits safely with minimal use of hands    Transfers Able to transfer safely, minor use of hands    Standing Unsupported with Eyes Closed Able to stand 10 seconds with supervision    Standing Unsupported with Feet Together Able to place feet together independently and stand for 1 minute with supervision    2-3 inches btw feet   From Standing, Reach Forward with Outstretched Arm Can reach forward >5 cm safely (2")    From Standing Position, Pick up Object from Floor Able to pick up shoe, needs supervision    From Standing Position, Turn to Look Behind Over each Shoulder Needs assist to keep from losing balance and falling   unable d/t h/o back fusions   Turn 360 Degrees Needs close supervision or verbal cueing    Standing Unsupported, Alternately Place Feet on Step/Stool Able to complete >2 steps/needs minimal assist    Standing Unsupported, One Foot in Front Able to take small step independently and hold 30 seconds    Standing on One Leg Tries to lift leg/unable to hold 3 seconds but remains standing independently    Total Score 36    Berg comment: < 36 high risk for falls (close to 100%)      Timed Up and Go Test   Normal TUG (seconds) 17.9    TUG Comments Normal: >13.5 sec indicates high fall risk   >16 sec significantly associated with increased risk for falls in patients with PD                       Objective measurements completed on examination: See above findings.                  PT Short Term Goals - 09/22/21 1110       PT  SHORT TERM GOAL #1   Title Patient will be independent with initial HEP    Status New    Target Date 10/13/21               PT Long Term Goals - 09/22/21 1110       PT LONG TERM GOAL #1   Title Patient will be independent with ongoing/advanced HEP for self-management at home    Status New    Target Date 11/03/21      PT LONG TERM GOAL #2   Title Patient will demonstrate improved B LE strength to >/= 4/5 for improved stability and ease of mobility    Status New    Target Date 11/03/21      PT LONG TERM GOAL #3   Title Patient will ambulate with improved upright posture, increased stride length, improved foot clearance and increased gait speed to >/= 2.62 ft/sec with or w/o SPC and >/= 3.0 ft/sec with rollator to  increase safety with community ambulation    Baseline 2.22 ft/sec w/o AD, 2.18 ft/sec with SPC, 2.73 ft/sec with rollator    Status New    Target Date 11/03/21      PT LONG TERM GOAL #4   Title Patient will improve 5x STS time to </= 20 seconds for improved efficiency and safety with transfers    Baseline 5xSTS = 26.65 sec    Status New    Target Date 11/03/21      PT LONG TERM GOAL #5   Title Patient will demonstrate decreased TUG time to </= 14 sec to decrease risk for falls with transitional mobility    Baseline TUG = 17.9 sec    Status New    Target Date 11/03/21      PT LONG TERM GOAL #6   Title Patient will improve Berg score to >/= 45/56 to improve safety and stability with ADLs in standing and reduce risk for falls    Baseline Berg = 36/56    Status New    Target Date 11/03/21                    Plan - 09/22/21 1110     Clinical Impression Statement Aaron Schmidt is an 87 y/o male referred to OP PT for Parkinson's disease. He was initially diagnosed ~1 year ago but this is his first PT encounter related to the PD diagnosis due to pt refusal to address Parkinsons in prior PT episode for back pain despite orders from Dr. Carles Collet at the time. His biggest concerns related to the Parkinsons disease are his balance and the tremor in his L LE, but only one fall reported in the past 6 months. He demonstrates a forward flexed posture in standing and during gait with gait pattern demonstrating slight shuffling gait with decreased hip and knee flexion, decreased stride length and decreased gait speed, esp w/o AD or with cane where gait speeds indicate only limited community level ambulation (2.22 ft/sec w/o AD, 2.18 ft/sec with SPC, 2.73 ft/sec with rollator). He reports he typically uses his rollator when ambulating around the [facility] independent living community but mostly use the cane if leaving the community. Trunk ROM very limited due to h/o prior back surgeries including fusions with  decreased LE flexibility also evident. Mild to moderate LE weakness present. Standardized balance testing indicating that he is at or above the threshold for a high fall risk with 5xSTS = 26.65 sec, TUG = 17.9 sec and Berg =  36/56. Aaron Schmidt will benefit from skilled PT for stretching/flexibility along with core and LE stability/strength training to improve posture and balance, as well as balance and dynamic gait training incorporating PWR! Moves as able to improve safety and decrease risk for falls.    Personal Factors and Comorbidities Age;Comorbidity 3+;Fitness;Past/Current Experience;Social Background;Behavior Pattern    Comorbidities Parkinson's, arthritis, chronic pain, multiple back surgeries/fusions, CKD, falls    Examination-Activity Limitations Bend;Sit;Stand;Transfers;Locomotion Level;Bed Mobility    Examination-Participation Restrictions Community Activity;Interpersonal Relationship    Stability/Clinical Decision Making Evolving/Moderate complexity    Clinical Decision Making Moderate    Rehab Potential Fair    PT Frequency 2x / week    PT Duration 6 weeks    PT Treatment/Interventions ADLs/Self Care Home Management;Cryotherapy;Electrical Stimulation;Iontophoresis 4mg /ml Dexamethasone;Moist Heat;Ultrasound;DME Instruction;Gait training;Stair training;Functional mobility training;Therapeutic activities;Therapeutic exercise;Balance training;Neuromuscular re-education;Patient/family education;Manual techniques;Passive range of motion;Dry needling;Energy conservation;Taping    PT Next Visit Plan Create initial HEP for LE/core flexibility and strengthening    Consulted and Agree with Plan of Care Patient             Patient will benefit from skilled therapeutic intervention in order to improve the following deficits and impairments:  Abnormal gait, Cardiopulmonary status limiting activity, Decreased activity tolerance, Decreased balance, Decreased coordination, Decreased endurance, Decreased  knowledge of use of DME, Decreased mobility, Decreased range of motion, Decreased strength, Difficulty walking, Hypomobility, Increased fascial restricitons, Increased muscle spasms, Impaired perceived functional ability, Impaired flexibility, Improper body mechanics, Postural dysfunction, Pain  Visit Diagnosis: Other abnormalities of gait and mobility  Abnormal posture  Unsteadiness on feet  Muscle weakness (generalized)     Problem List Patient Active Problem List   Diagnosis Date Noted   Mitral regurgitation 01/12/2021   Anemia due to chronic blood loss 01/10/2021   Benign prostatic hyperplasia 01/10/2021   Benign prostatic hyperplasia with lower urinary tract symptoms 01/10/2021   Chronic kidney disease, stage 3 unspecified ([place]) 01/10/2021   Peripheral edema 01/10/2021   Enlarged prostate 01/10/2021   Hardening of the aorta (main artery of the heart) ([place]) 01/10/2021   Hypoalbuminemia 01/10/2021   Impaired fasting glucose 01/10/2021   Iron deficiency anemia 01/10/2021   Kidney stone 01/10/2021   Mitral valve disorder 01/10/2021   Osteopenia 01/10/2021   Other specified disorders of kidney and ureter 01/10/2021   Peripheral neuralgia 01/10/2021   Sciatica 01/10/2021   Personal history of colonic polyps 01/10/2021   Personal history of other diseases of the respiratory system 01/10/2021   Pure hypercholesterolemia 01/10/2021   Thrombocytopenia ([place]) 01/10/2021   Tubular adenoma of colon 01/10/2021   Unspecified abnormal finding in specimens from other organs, systems and tissues 01/10/2021   Edema 01/10/2021   Old myocardial infarction 01/10/2021   Osteoarthrosis 01/10/2021   Intermittent tremor 01/10/2021   Arthritis    History of kidney stones    Hypertension    Parkinson's disease ([place])    Occasional tremors 07/26/2020   Chronic bilateral low back pain 07/26/2020   Sensorineural hearing loss (SNHL) of both ears 06/15/2020   Muscle pain 03/01/2020    Degenerative lumbar spinal stenosis 09/22/2019   Body mass index (BMI) 25.0-25.9, adult 08/31/2019   Scoliosis concern 08/31/2019   Lumbar stenosis with neurogenic claudication 08/31/2019   Spinal stenosis, lumbar region without neurogenic claudication 08/19/2019   Hypotension 06/23/2019   Myocarditis ([place]) 05/19/2019   CAD (coronary artery disease) 04/20/2019   Obesity 04/06/2019   Malnutrition of moderate degree 03/23/2019   NSTEMI (non-ST elevated myocardial infarction) ([place]) 03/21/2019  Myocardial infarction ([place]) 03/21/2019   Anemia 09/21/2017   Symptomatic anemia 09/20/2017   Protein-calorie malnutrition, severe 09/11/2017   Empyema lung ([place]) 09/09/2017   SIRS (systemic inflammatory response syndrome) ([place]) 09/05/2017   Elevated troponin 09/05/2017   Normochromic normocytic anemia 09/05/2017   Hyponatremia 09/05/2017   Acute kidney injury superimposed on chronic kidney disease ([place]) 09/05/2017   Chronic pain 09/05/2017   Pleural effusion, left 09/04/2017   Pleural effusion on left 08/19/2017   Sinusitis, chronic 08/19/2017   Fall in home 07/10/2017   Murmur 01/08/2017   Skin ulcer of toe of left foot, limited to breakdown of skin ([place]) 09/04/2016   Displacement of cervical intervertebral disc 07/19/2014   Cervical myelopathy with cervical radiculopathy ([facility]) 06/24/2014   LBBB (left bundle branch block) 06/21/2014   Hyperlipidemia 06/21/2014   Preoperative cardiovascular examination 06/21/2014   Degenerative cervical spinal stenosis 05/26/2014   Cervical spondylosis without myelopathy 05/12/2014   Neck pain 03/03/2014   Shoulder joint pain 12/01/2013   Spinal stenosis of lumbar region 08/10/2013   Chronic low back pain 07/29/2013   Lumbar radiculopathy 07/29/2013   Lumbar post-laminectomy syndrome 07/15/2013   Obstructive sleep apnea 09/30/2010   Sleep apnea 2012   Essential hypertension 08/29/2010    Percival Spanish, PT 09/22/2021, 12:49 PM  [facility] [address]  [place], [place], [address] Phone: [phone]   Fax:  [phone]  Name: Aaron Schmidt MRN: [id number] Date of Birth: [birth year]

## 2021-10-02 ENCOUNTER — Other Ambulatory Visit: Payer: Self-pay

## 2021-10-02 ENCOUNTER — Ambulatory Visit: Payer: Medicare Other | Attending: Neurology | Admitting: Physical Therapy

## 2021-10-02 ENCOUNTER — Encounter: Payer: Self-pay | Admitting: Physical Therapy

## 2021-10-02 DIAGNOSIS — M6281 Muscle weakness (generalized): Secondary | ICD-10-CM

## 2021-10-02 DIAGNOSIS — R2681 Unsteadiness on feet: Secondary | ICD-10-CM

## 2021-10-02 DIAGNOSIS — R293 Abnormal posture: Secondary | ICD-10-CM | POA: Diagnosis present

## 2021-10-02 DIAGNOSIS — R2689 Other abnormalities of gait and mobility: Secondary | ICD-10-CM | POA: Diagnosis not present

## 2021-10-02 NOTE — Patient Instructions (Signed)
° °  Access Code: [email or web address] URL: [email or web address] Date: 10/02/2021 Prepared by: Annie Paras  Exercises Seated Hamstring Stretch - 2-3 x daily - 7 x weekly - 3 reps - 30 sec hold Seated Hip Flexor Stretch - 2-3 x daily - 7 x weekly - 3 reps - 30 sec hold

## 2021-10-02 NOTE — Therapy (Signed)
[facility] [address]  [place], [place], [address] Phone: [phone]   Fax:  [phone]  Physical Therapy Treatment  Patient Details  Name: Aaron Schmidt MRN: [id number] Date of Birth: 07-Jan-1933 Referring Provider (PT): Alonza Bogus, DO   Encounter Date: 10/02/2021   PT End of Session - 10/02/21 1112     Visit Number 2    Number of Visits 12    Date for PT Re-Evaluation 11/03/21    Authorization Type [employer] Medicare    PT Start Time 1112   Pt arrived late   PT Stop Time 1147    PT Time Calculation (min) 35 min    Activity Tolerance Patient tolerated treatment well    Behavior During Therapy [facility] for tasks assessed/performed             Past Medical History:  Diagnosis Date   Acute kidney injury superimposed on chronic kidney disease ([place]) 09/05/2017   Anemia 09/21/2017   Anemia due to chronic blood loss 01/10/2021   Arthritis    Degeneration spine & stenosis   Benign prostatic hyperplasia 01/10/2021   Benign prostatic hyperplasia with lower urinary tract symptoms 01/10/2021   Body mass index (BMI) 25.0-25.9, adult 08/31/2019   CAD (coronary artery disease) 04/20/2019   Cervical myelopathy with cervical radiculopathy ([place]) 06/24/2014   Cervical spondylosis without myelopathy 05/12/2014   Chronic bilateral low back pain 07/26/2020   Chronic kidney disease, stage 3 unspecified ([place]) 01/10/2021   Chronic low back pain 07/29/2013   Chronic pain 09/05/2017   Degenerative cervical spinal stenosis 05/26/2014   Degenerative lumbar spinal stenosis 09/22/2019   Displacement of cervical intervertebral disc 07/19/2014   Edema 01/10/2021   Elevated troponin 09/05/2017   Empyema lung ([place]) 09/09/2017   Enlarged prostate 01/10/2021   Essential hypertension 08/29/2010   Qualifier: Diagnosis of  By: Elsworth Soho MD, Leanna Sato     Fall in home 07/10/2017   Hardening of the aorta (main artery of the heart) ([place]) 01/10/2021   History of  kidney stones    Hyperlipidemia 06/21/2014   Hypertension    has a histroy of   Hypoalbuminemia 01/10/2021   Hyponatremia 09/05/2017   Hypotension    Impaired fasting glucose 01/10/2021   Intermittent tremor 01/10/2021   Iron deficiency anemia 01/10/2021   Kidney stone 01/10/2021   LBBB (left bundle branch block) 06/21/2014   Lumbar post-laminectomy syndrome 07/15/2013   Lumbar radiculopathy 07/29/2013   Lumbar stenosis with neurogenic claudication 08/31/2019   Malnutrition of moderate degree 03/23/2019   Mitral valve disorder 01/10/2021   Murmur 01/08/2017   Muscle pain 03/01/2020   Myocardial infarction ([place]) 03/21/2019   Neck pain 03/03/2014   Normochromic normocytic anemia 09/05/2017   NSTEMI (non-ST elevated myocardial infarction) ([place]) 03/21/2019   Obesity 04/06/2019   Obstructive sleep apnea 09/30/2010   PSG (180 lbs ) >> severe obstructive sleep apnea with AHi 38/h & desatns to 82%  Started on auto 5-15  >> changed to 8 cm Download reviewed 1/21 - 11/23/10 >> residual events of 6/h , avg pr 8 cm, leak + with nasal pillows     Occasional tremors 07/26/2020   Old myocardial infarction 01/10/2021   Osteoarthrosis 01/10/2021   Osteopenia 01/10/2021   Other specified disorders of kidney and ureter 01/10/2021   Parkinson's disease ([place])    Peripheral edema 01/10/2021   Peripheral neuralgia 01/10/2021   Personal history of colonic polyps 01/10/2021   Personal history of other  diseases of the respiratory system 01/10/2021   Pleural effusion on left 08/19/2017   Acute symptoms since 08/13/17 > CTa  08/13/17  neg pe/ extensively loculated effusion - L thoracentesis 08/20/2017 :  220 cc with glucose < 20 and WBC 5,338 mostly Poly, LDH 688 > cyt neg >Referrd to T surgery > VATS 09/09/17 c/w empyema   Pleural effusion, left 09/04/2017   Preoperative cardiovascular examination 06/21/2014   Protein-calorie malnutrition, severe 09/11/2017   Pure hypercholesterolemia 01/10/2021   Sciatica 01/10/2021   Scoliosis  concern 08/31/2019   Sensorineural hearing loss (SNHL) of both ears 06/15/2020   Last Assessment & Plan:  Formatting of this note might be different from the original. Concern over worsening hearing. Chronic history of sensorineural hearing loss bilaterally.  Has hearing aids currently.  He feels his hearing aids are not working as well as they historically have.  Recently had them checked. EXAM shows normal external canals and tympanic membranes bilaterally. AUDIOGRAM Shows B   Shoulder joint pain 12/01/2013   Sinusitis, chronic 08/19/2017   CT sinus  08/20/2017 >>> Normally aerated paranasal sinuses.  Patent sinus drainage pathways.   SIRS (systemic inflammatory response syndrome) ([place]) 09/05/2017   Skin ulcer of toe of left foot, limited to breakdown of skin ([place]) 09/04/2016   Sleep apnea 2012   used CPAP 2 yrs. ago, feels he sleeps better w/o, no longer using    Spinal stenosis of lumbar region 08/10/2013   Spinal stenosis, lumbar region without neurogenic claudication 08/19/2019   Symptomatic anemia 09/20/2017   Thrombocytopenia ([place]) 01/10/2021   Tubular adenoma of colon 01/10/2021   Unspecified abnormal finding in specimens from other organs, systems and tissues 01/10/2021    Past Surgical History:  Procedure Laterality Date   ANTERIOR LAT LUMBAR FUSION N/A 09/22/2019   Procedure: Lumbar one-two, Lumbar two-three Anterolateral lumbar interbody fusion with pedicle screw fixation and exploration of adjacent fusion;  Surgeon: Erline Levine, MD;  Location: [facility];  Service: Neurosurgery;  Laterality: N/A;   ARM NEUROPLASTY     at 14 yrs. of age- fell off tractor- had fracture & repair *& later- 1990's had  transplantation of a nerve at the elbow   BACK SURGERY     x4 back surgery x2 fusion -   CARDIAC CATHETERIZATION  03/23/2019   CARPAL TUNNEL RELEASE Right    COLONOSCOPY     ESOPHAGOGASTRODUODENOSCOPY (EGD) WITH PROPOFOL N/A 09/23/2017   Procedure: ESOPHAGOGASTRODUODENOSCOPY (EGD) WITH  PROPOFOL;  Surgeon: Laurence Spates, MD;  Location: [facility];  Service: Endoscopy;  Laterality: N/A;   LEFT HEART CATH AND CORONARY ANGIOGRAPHY N/A 03/23/2019   Procedure: LEFT HEART CATH AND CORONARY ANGIOGRAPHY;  Surgeon: Burnell Blanks, MD;  Location: [facility];  Service: Cardiovascular;  Laterality: N/A;   LUMBAR PERCUTANEOUS PEDICLE SCREW 2 LEVEL N/A 09/22/2019   Procedure: LUMBAR PERCUTANEOUS PEDICLE SCREW TWO LEVEL;  Surgeon: Erline Levine, MD;  Location: [facility];  Service: Neurosurgery;  Laterality: N/A;   PLEURAL EFFUSION DRAINAGE Left 09/09/2017   Procedure: DRAINAGE OF PLEURAL EFFUSION;  Surgeon: [name], MD;  Location: [facility];  Service: Thoracic;  Laterality: Left;   POSTERIOR CERVICAL FUSION/FORAMINOTOMY N/A 06/24/2014   Procedure: Posterior Cervical Three-Seven Fusion with Lateral Mass Fixation;  Surgeon: Erline Levine, MD;  Location: [facility];  Service: Neurosurgery;  Laterality: N/A;  C3-C7 posterior cervical fusion with lateral mass fixation   SHOULDER SURGERY Bilateral    VIDEO ASSISTED THORACOSCOPY (VATS)/DECORTICATION Left 09/09/2017   Procedure: VIDEO  ASSISTED THORACOSCOPY (VATS)/DECORTICATION;  Surgeon: [name], MD;  Location: [facility];  Service: Thoracic;  Laterality: Left;   VIDEO BRONCHOSCOPY  09/09/2017   Procedure: VIDEO BRONCHOSCOPY;  Surgeon: [name], MD;  Location: [facility];  Service: Thoracic;;    There were no vitals filed for this visit.   Subjective Assessment - 10/02/21 1119     Subjective Pt reports increased LBP today.    Pertinent History Parkinson's disease, frequent falls, chronic LBP, history of multiple back/neck surgeries and fusions.    How long can you walk comfortably? limited by fatigue usually ~600 ft    Currently in Pain? Yes    Pain Score 5     Pain Location Back    Pain Orientation Lower;Left    Pain Descriptors / Indicators Burning    Pain Type Chronic pain                                [facility] Adult PT Treatment/Exercise - 10/02/21 1112       Lumbar Exercises: Stretches   Passive Hamstring Stretch Right;Left;2 reps;30 seconds    Passive Hamstring Stretch Limitations seated hip hinge    Hip Flexor Stretch Right;Left;2 reps;30 seconds    Hip Flexor Stretch Limitations seated lunge position    Quadruped Mid Back Stretch 2 reps;30 seconds    Quadruped Mid Back Stretch Limitations seated 3-way prayer stretch with cane      Lumbar Exercises: Aerobic   Recumbent Bike L1 x 6 min               [facility]) - 10/02/21 1112     PWR! exercises Moves in sitting    PWR! Up x10    PWR! Rock x7   limited by c/o sharp pain in low back   PWR! Twist x10   using orange boomwhackers for extended reach   PWR! Step x6   limited by c/o sharp pain in low back                 PT Education - 10/02/21 1146     Education Details Initial HEP - Access Code: [email or web address]    Person(s) Educated Patient    Methods Explanation;Demonstration;Verbal cues;Tactile cues;Handout    Comprehension Verbalized understanding;Verbal cues required;Tactile cues required;Returned demonstration;Need further instruction              PT Short Term Goals - 10/02/21 1120       PT SHORT TERM GOAL #1   Title Patient will be independent with initial HEP    Status On-going    Target Date 10/13/21               PT Long Term Goals - 10/02/21 1120       PT LONG TERM GOAL #1   Title Patient will be independent with ongoing/advanced HEP for self-management at home    Status On-going    Target Date 11/03/21      PT LONG TERM GOAL #2   Title Patient will demonstrate improved B LE strength to >/= 4/5 for improved stability and ease of mobility    Status On-going    Target Date 11/03/21      PT LONG TERM GOAL #3   Title Patient will ambulate with improved upright posture, increased stride length, improved foot clearance and increased gait speed to >/=  2.62 ft/sec with or w/o SPC and >/= 3.0 ft/sec with  rollator to increase safety with community ambulation    Baseline 2.22 ft/sec w/o AD, 2.18 ft/sec with SPC, 2.73 ft/sec with rollator    Status On-going    Target Date 11/03/21      PT LONG TERM GOAL #4   Title Patient will improve 5x STS time to </= 20 seconds for improved efficiency and safety with transfers    Baseline 5xSTS = 26.65 sec    Status On-going    Target Date 11/03/21      PT LONG TERM GOAL #5   Title Patient will demonstrate decreased TUG time to </= 14 sec to decrease risk for falls with transitional mobility    Baseline TUG = 17.9 sec    Status On-going    Target Date 11/03/21      PT LONG TERM GOAL #6   Title Patient will improve Berg score to >/= 45/56 to improve safety and stability with ADLs in standing and reduce risk for falls    Baseline Berg = 36/56    Status On-going    Target Date 11/03/21                   Plan - 10/02/21 1122     Clinical Impression Statement Aaron Schmidt reports increased LBP today which limited some attempts at exercises/stretches as well as introduction of seated PWR! Moves. He was able to tolerate some review of his lumbar stretches as well as introduction of seated HS and hip flexor stretches w/o c/o increased LBP, therefore HEP handout provided for new stretches. Session limited today due to pt late arrival but will continue to attempt to gently increase mobility with emphasis on PD related movement patterns.    Comorbidities Parkinson's, arthritis, chronic pain, multiple back surgeries/fusions, CKD, falls    Rehab Potential Fair    PT Frequency 2x / week    PT Duration 6 weeks    PT Treatment/Interventions ADLs/Self Care Home Management;Cryotherapy;Electrical Stimulation;Iontophoresis 4mg /ml Dexamethasone;Moist Heat;Ultrasound;DME Instruction;Gait training;Stair training;Functional mobility training;Therapeutic activities;Therapeutic exercise;Balance training;Neuromuscular  re-education;Patient/family education;Manual techniques;Passive range of motion;Dry needling;Energy conservation;Taping    PT Next Visit Plan Gently progress LE/core flexibility and strengthening, incorporating PWR! Moves and balance activities as tolerated    PT Home Exercise Plan Access Code: [email or web address] (1/9)    Consulted and Agree with Plan of Care Patient             Patient will benefit from skilled therapeutic intervention in order to improve the following deficits and impairments:  Abnormal gait, Cardiopulmonary status limiting activity, Decreased activity tolerance, Decreased balance, Decreased coordination, Decreased endurance, Decreased knowledge of use of DME, Decreased mobility, Decreased range of motion, Decreased strength, Difficulty walking, Hypomobility, Increased fascial restricitons, Increased muscle spasms, Impaired perceived functional ability, Impaired flexibility, Improper body mechanics, Postural dysfunction, Pain  Visit Diagnosis: Other abnormalities of gait and mobility  Abnormal posture  Unsteadiness on feet  Muscle weakness (generalized)     Problem List Patient Active Problem List   Diagnosis Date Noted   Mitral regurgitation 01/12/2021   Anemia due to chronic blood loss 01/10/2021   Benign prostatic hyperplasia 01/10/2021   Benign prostatic hyperplasia with lower urinary tract symptoms 01/10/2021   Chronic kidney disease, stage 3 unspecified ([place]) 01/10/2021   Peripheral edema 01/10/2021   Enlarged prostate 01/10/2021   Hardening of the aorta (main artery of the heart) ([place]) 01/10/2021   Hypoalbuminemia 01/10/2021   Impaired fasting glucose 01/10/2021   Iron deficiency anemia 01/10/2021   Kidney stone 01/10/2021   Mitral  valve disorder 01/10/2021   Osteopenia 01/10/2021   Other specified disorders of kidney and ureter 01/10/2021   Peripheral neuralgia 01/10/2021   Sciatica 01/10/2021   Personal history of colonic polyps 01/10/2021   Personal  history of other diseases of the respiratory system 01/10/2021   Pure hypercholesterolemia 01/10/2021   Thrombocytopenia ([place]) 01/10/2021   Tubular adenoma of colon 01/10/2021   Unspecified abnormal finding in specimens from other organs, systems and tissues 01/10/2021   Edema 01/10/2021   Old myocardial infarction 01/10/2021   Osteoarthrosis 01/10/2021   Intermittent tremor 01/10/2021   Arthritis    History of kidney stones    Hypertension    Parkinson's disease ([place])    Occasional tremors 07/26/2020   Chronic bilateral low back pain 07/26/2020   Sensorineural hearing loss (SNHL) of both ears 06/15/2020   Muscle pain 03/01/2020   Degenerative lumbar spinal stenosis 09/22/2019   Body mass index (BMI) 25.0-25.9, adult 08/31/2019   Scoliosis concern 08/31/2019   Lumbar stenosis with neurogenic claudication 08/31/2019   Spinal stenosis, lumbar region without neurogenic claudication 08/19/2019   Hypotension 06/23/2019   Myocarditis ([place]) 05/19/2019   CAD (coronary artery disease) 04/20/2019   Obesity 04/06/2019   Malnutrition of moderate degree 03/23/2019   NSTEMI (non-ST elevated myocardial infarction) ([place]) 03/21/2019   Myocardial infarction ([place]) 03/21/2019   Anemia 09/21/2017   Symptomatic anemia 09/20/2017   Protein-calorie malnutrition, severe 09/11/2017   Empyema lung ([place]) 09/09/2017   SIRS (systemic inflammatory response syndrome) ([place]) 09/05/2017   Elevated troponin 09/05/2017   Normochromic normocytic anemia 09/05/2017   Hyponatremia 09/05/2017   Acute kidney injury superimposed on chronic kidney disease ([place]) 09/05/2017   Chronic pain 09/05/2017   Pleural effusion, left 09/04/2017   Pleural effusion on left 08/19/2017   Sinusitis, chronic 08/19/2017   Fall in home 07/10/2017   Murmur 01/08/2017   Skin ulcer of toe of left foot, limited to breakdown of skin ([place]) 09/04/2016   Displacement of cervical intervertebral disc 07/19/2014   Cervical myelopathy with  cervical radiculopathy ([place]) 06/24/2014   LBBB (left bundle branch block) 06/21/2014   Hyperlipidemia 06/21/2014   Preoperative cardiovascular examination 06/21/2014   Degenerative cervical spinal stenosis 05/26/2014   Cervical spondylosis without myelopathy 05/12/2014   Neck pain 03/03/2014   Shoulder joint pain 12/01/2013   Spinal stenosis of lumbar region 08/10/2013   Chronic low back pain 07/29/2013   Lumbar radiculopathy 07/29/2013   Lumbar post-laminectomy syndrome 07/15/2013   Obstructive sleep apnea 09/30/2010   Sleep apnea 2012   Essential hypertension 08/29/2010    Percival Spanish, PT 10/02/2021, 12:13 PM  [facility] [address]  [place], [place], [address] Phone: [phone]   Fax:  [phone]  Name: KEONDRE MARKSON MRN: [id number] Date of Birth: [birth year]

## 2021-10-06 ENCOUNTER — Other Ambulatory Visit: Payer: Self-pay

## 2021-10-06 ENCOUNTER — Encounter: Payer: Self-pay | Admitting: Physical Therapy

## 2021-10-06 ENCOUNTER — Ambulatory Visit: Payer: Medicare Other | Admitting: Physical Therapy

## 2021-10-06 DIAGNOSIS — R2689 Other abnormalities of gait and mobility: Secondary | ICD-10-CM | POA: Diagnosis not present

## 2021-10-06 DIAGNOSIS — R2681 Unsteadiness on feet: Secondary | ICD-10-CM

## 2021-10-06 DIAGNOSIS — R293 Abnormal posture: Secondary | ICD-10-CM

## 2021-10-06 DIAGNOSIS — M6281 Muscle weakness (generalized): Secondary | ICD-10-CM

## 2021-10-06 NOTE — Therapy (Signed)
[facility] [address]  [place], [place], [address] Phone: [phone]   Fax:  [phone]  Physical Therapy Treatment  Patient Details  Name: Aaron Schmidt MRN: [id number] Date of Birth: [birth year] Referring Provider (PT): Alonza Bogus, DO   Encounter Date: 10/06/2021   PT End of Session - 10/06/21 1103     Visit Number 3    Number of Visits 12    Date for PT Re-Evaluation 11/03/21    Authorization Type [employer] Medicare    PT Start Time 1103    PT Stop Time 1144    PT Time Calculation (min) 41 min    Activity Tolerance Patient tolerated treatment well    Behavior During Therapy [facility] for tasks assessed/performed             Past Medical History:  Diagnosis Date   Acute kidney injury superimposed on chronic kidney disease ([place]) 09/05/2017   Anemia 09/21/2017   Anemia due to chronic blood loss 01/10/2021   Arthritis    Degeneration spine & stenosis   Benign prostatic hyperplasia 01/10/2021   Benign prostatic hyperplasia with lower urinary tract symptoms 01/10/2021   Body mass index (BMI) 25.0-25.9, adult 08/31/2019   CAD (coronary artery disease) 04/20/2019   Cervical myelopathy with cervical radiculopathy ([place]) 06/24/2014   Cervical spondylosis without myelopathy 05/12/2014   Chronic bilateral low back pain 07/26/2020   Chronic kidney disease, stage 3 unspecified ([place]) 01/10/2021   Chronic low back pain 07/29/2013   Chronic pain 09/05/2017   Degenerative cervical spinal stenosis 05/26/2014   Degenerative lumbar spinal stenosis 09/22/2019   Displacement of cervical intervertebral disc 07/19/2014   Edema 01/10/2021   Elevated troponin 09/05/2017   Empyema lung ([place]) 09/09/2017   Enlarged prostate 01/10/2021   Essential hypertension 08/29/2010   Qualifier: Diagnosis of  By: Elsworth Soho MD, Leanna Sato     Fall in home 07/10/2017   Hardening of the aorta (main artery of the heart) ([place]) 01/10/2021   History of kidney stones     Hyperlipidemia 06/21/2014   Hypertension    has a histroy of   Hypoalbuminemia 01/10/2021   Hyponatremia 09/05/2017   Hypotension    Impaired fasting glucose 01/10/2021   Intermittent tremor 01/10/2021   Iron deficiency anemia 01/10/2021   Kidney stone 01/10/2021   LBBB (left bundle branch block) 06/21/2014   Lumbar post-laminectomy syndrome 07/15/2013   Lumbar radiculopathy 07/29/2013   Lumbar stenosis with neurogenic claudication 08/31/2019   Malnutrition of moderate degree 03/23/2019   Mitral valve disorder 01/10/2021   Murmur 01/08/2017   Muscle pain 03/01/2020   Myocardial infarction ([place]) 03/21/2019   Neck pain 03/03/2014   Normochromic normocytic anemia 09/05/2017   NSTEMI (non-ST elevated myocardial infarction) ([place]) 03/21/2019   Obesity 04/06/2019   Obstructive sleep apnea 09/30/2010   PSG (180 lbs ) >> severe obstructive sleep apnea with AHi 38/h & desatns to 82%  Started on auto 5-15  >> changed to 8 cm Download reviewed 1/21 - 11/23/10 >> residual events of 6/h , avg pr 8 cm, leak + with nasal pillows     Occasional tremors 07/26/2020   Old myocardial infarction 01/10/2021   Osteoarthrosis 01/10/2021   Osteopenia 01/10/2021   Other specified disorders of kidney and ureter 01/10/2021   Parkinson's disease ([place])    Peripheral edema 01/10/2021   Peripheral neuralgia 01/10/2021   Personal history of colonic polyps 01/10/2021   Personal history of other diseases of the respiratory  system 01/10/2021   Pleural effusion on left 08/19/2017   Acute symptoms since 08/13/17 > CTa  08/13/17  neg pe/ extensively loculated effusion - L thoracentesis 08/20/2017 :  220 cc with glucose < 20 and WBC 5,338 mostly Poly, LDH 688 > cyt neg >Referrd to T surgery > VATS 09/09/17 c/w empyema   Pleural effusion, left 09/04/2017   Preoperative cardiovascular examination 06/21/2014   Protein-calorie malnutrition, severe 09/11/2017   Pure hypercholesterolemia 01/10/2021   Sciatica 01/10/2021   Scoliosis concern 08/31/2019    Sensorineural hearing loss (SNHL) of both ears 06/15/2020   Last Assessment & Plan:  Formatting of this note might be different from the original. Concern over worsening hearing. Chronic history of sensorineural hearing loss bilaterally.  Has hearing aids currently.  He feels his hearing aids are not working as well as they historically have.  Recently had them checked. EXAM shows normal external canals and tympanic membranes bilaterally. AUDIOGRAM Shows B   Shoulder joint pain 12/01/2013   Sinusitis, chronic 08/19/2017   CT sinus  08/20/2017 >>> Normally aerated paranasal sinuses.  Patent sinus drainage pathways.   SIRS (systemic inflammatory response syndrome) ([place]) 09/05/2017   Skin ulcer of toe of left foot, limited to breakdown of skin ([place]) 09/04/2016   Sleep apnea 2012   used CPAP 2 yrs. ago, feels he sleeps better w/o, no longer using    Spinal stenosis of lumbar region 08/10/2013   Spinal stenosis, lumbar region without neurogenic claudication 08/19/2019   Symptomatic anemia 09/20/2017   Thrombocytopenia ([place]) 01/10/2021   Tubular adenoma of colon 01/10/2021   Unspecified abnormal finding in specimens from other organs, systems and tissues 01/10/2021    Past Surgical History:  Procedure Laterality Date   ANTERIOR LAT LUMBAR FUSION N/A 09/22/2019   Procedure: Lumbar one-two, Lumbar two-three Anterolateral lumbar interbody fusion with pedicle screw fixation and exploration of adjacent fusion;  Surgeon: Erline Levine, MD;  Location: [facility];  Service: Neurosurgery;  Laterality: N/A;   ARM NEUROPLASTY     at 14 yrs. of age- fell off tractor- had fracture & repair *& later- 1990's had  transplantation of a nerve at the elbow   BACK SURGERY     x4 back surgery x2 fusion -   CARDIAC CATHETERIZATION  03/23/2019   CARPAL TUNNEL RELEASE Right    COLONOSCOPY     ESOPHAGOGASTRODUODENOSCOPY (EGD) WITH PROPOFOL N/A 09/23/2017   Procedure: ESOPHAGOGASTRODUODENOSCOPY (EGD) WITH PROPOFOL;  Surgeon:  Laurence Spates, MD;  Location: [facility];  Service: Endoscopy;  Laterality: N/A;   LEFT HEART CATH AND CORONARY ANGIOGRAPHY N/A 03/23/2019   Procedure: LEFT HEART CATH AND CORONARY ANGIOGRAPHY;  Surgeon: Burnell Blanks, MD;  Location: [facility];  Service: Cardiovascular;  Laterality: N/A;   LUMBAR PERCUTANEOUS PEDICLE SCREW 2 LEVEL N/A 09/22/2019   Procedure: LUMBAR PERCUTANEOUS PEDICLE SCREW TWO LEVEL;  Surgeon: Erline Levine, MD;  Location: [facility];  Service: Neurosurgery;  Laterality: N/A;   PLEURAL EFFUSION DRAINAGE Left 09/09/2017   Procedure: DRAINAGE OF PLEURAL EFFUSION;  Surgeon: [name], MD;  Location: [facility];  Service: Thoracic;  Laterality: Left;   POSTERIOR CERVICAL FUSION/FORAMINOTOMY N/A 06/24/2014   Procedure: Posterior Cervical Three-Seven Fusion with Lateral Mass Fixation;  Surgeon: Erline Levine, MD;  Location: [facility];  Service: Neurosurgery;  Laterality: N/A;  C3-C7 posterior cervical fusion with lateral mass fixation   SHOULDER SURGERY Bilateral    VIDEO ASSISTED THORACOSCOPY (VATS)/DECORTICATION Left 09/09/2017   Procedure: VIDEO ASSISTED THORACOSCOPY (VATS)/DECORTICATION;  Surgeon: [name], MD;  Location: [facility];  Service: Thoracic;  Laterality: Left;   VIDEO BRONCHOSCOPY  09/09/2017   Procedure: VIDEO BRONCHOSCOPY;  Surgeon: [name], MD;  Location: [facility];  Service: Thoracic;;    There were no vitals filed for this visit.   Subjective Assessment - 10/06/21 1103     Subjective Pt reports he was unable to find a chair that worked to complete the hip flexor stretch at home.    Pertinent History Parkinson's disease, frequent falls, chronic LBP, history of multiple back/neck surgeries and fusions.    Currently in Pain? Yes    Pain Score 3     Pain Location Back    Pain Orientation Lower    Pain Descriptors / Indicators Other (Comment)   "pulling"   Pain Type Chronic pain                                [facility] Adult PT Treatment/Exercise - 10/06/21 1103       Lumbar Exercises: Stretches   Passive Hamstring Stretch Right;Left;2 reps;30 seconds    Passive Hamstring Stretch Limitations seated hip hinge    Hip Flexor Stretch Right;Left;2 reps;30 seconds    Hip Flexor Stretch Limitations standing fwd lunge with upright torso - pt noting more stretch in his calf than his hip; hooklying on wedge with 2 pillow and knee slighyly flexed over edge of mat table - limited tolerance    Quadruped Mid Back Stretch 2 reps;30 seconds    Quadruped Mid Back Stretch Limitations seated 3-way prayer stretch with cane      Lumbar Exercises: Aerobic   Nustep L3 x 4 min (RUE/B LE to promote reciprocal movement patterns - unable to use L arm as he does not feel like he reach out far enough with it)   discontinued at pt request because he states is was pulling on his L leg and if he kept going he would "end up in the hospital tomorrow"     Manual Therapy   Manual Therapy Soft tissue mobilization    Soft tissue mobilization STM/DTM and XFM to B distal hip flexors in sitting, supine and mod thomas position               [facility]) - 10/06/21 1103     PWR! exercises Moves in sitting    PWR! Up x10    PWR! Rock x8   stopped at pt request   PWR! Twist x10   using orange boomwhackers for extended reach   PWR! Step x7   stopped at pt request                   PT Short Term Goals - 10/02/21 1120       PT SHORT TERM GOAL #1   Title Patient will be independent with initial HEP    Status On-going    Target Date 10/13/21               PT Long Term Goals - 10/02/21 1120       PT LONG TERM GOAL #1   Title Patient will be independent with ongoing/advanced HEP for self-management at home    Status On-going    Target Date 11/03/21      PT LONG TERM GOAL #2   Title Patient will demonstrate improved B LE strength to >/= 4/5 for improved stability and ease  of mobility    Status On-going    Target Date 11/03/21      PT LONG TERM GOAL #3   Title Patient will ambulate with improved upright posture, increased stride length, improved foot clearance and increased gait speed to >/= 2.62 ft/sec with or w/o SPC and >/= 3.0 ft/sec with rollator to increase safety with community ambulation    Baseline 2.22 ft/sec w/o AD, 2.18 ft/sec with SPC, 2.73 ft/sec with rollator    Status On-going    Target Date 11/03/21      PT LONG TERM GOAL #4   Title Patient will improve 5x STS time to </= 20 seconds for improved efficiency and safety with transfers    Baseline 5xSTS = 26.65 sec    Status On-going    Target Date 11/03/21      PT LONG TERM GOAL #5   Title Patient will demonstrate decreased TUG time to </= 14 sec to decrease risk for falls with transitional mobility    Baseline TUG = 17.9 sec    Status On-going    Target Date 11/03/21      PT LONG TERM GOAL #6   Title Patient will improve Berg score to >/= 45/56 to improve safety and stability with ADLs in standing and reduce risk for falls    Baseline Berg = 36/56    Status On-going    Target Date 11/03/21                   Plan - 10/06/21 1159     Clinical Impression Statement Aaron Schmidt reports he was unable to find a chair at home that would work for the hip flexor stretch but denied any issues with the HS stretch. Attempted NuStep for warm-up today in place of recumbent bike to promote increased reciprocal motion but limited tolerance due to multiple issues: unable to use L arm due to elbow flexion contracture then requesting to stop due to c/o L LE pulling, stating he would end up in the hospital tomorrow if he kept going. Reviewed HS stretch with no concerns other than limited motion but pt reporting effective stretch. Attempted alternatives for hip flexor stretch including standing fwd weight shift/lunge and hooklying mod Thomas but pt with poor tolerance or ineffective stretch reported for  both and he is unable to attempt any prone versions of hip flexor stretch due to inability to achieve prone position. STM performed to hip flexors while in supine and mod Thomas position with extreme tightness and taut bands noted. Pt reports better tolerance for supine position in bed at home, therefore requested he try allowing his knee to flex slightly over the edge of the bed to see if he would be able to tolerate the stretch at home. Pt continues to self-limit attempts at seated PWR! Moves due to c/o low back pain.    Comorbidities Parkinson's, arthritis, chronic pain, multiple back surgeries/fusions, CKD, falls    Rehab Potential Fair    PT Frequency 2x / week    PT Duration 6 weeks    PT Treatment/Interventions ADLs/Self Care Home Management;Cryotherapy;Electrical Stimulation;Iontophoresis 4mg /ml Dexamethasone;Moist Heat;Ultrasound;DME Instruction;Gait training;Stair training;Functional mobility training;Therapeutic activities;Therapeutic exercise;Balance training;Neuromuscular re-education;Patient/family education;Manual techniques;Passive range of motion;Dry needling;Energy conservation;Taping    PT Next Visit Plan Gently progress LE/core flexibility and strengthening, incorporating PWR! Moves and balance activities as tolerated    PT Home Exercise Plan Access Code: [email or web address] (1/9)    Consulted and Agree with Plan of Care Patient  Patient will benefit from skilled therapeutic intervention in order to improve the following deficits and impairments:  Abnormal gait, Cardiopulmonary status limiting activity, Decreased activity tolerance, Decreased balance, Decreased coordination, Decreased endurance, Decreased knowledge of use of DME, Decreased mobility, Decreased range of motion, Decreased strength, Difficulty walking, Hypomobility, Increased fascial restricitons, Increased muscle spasms, Impaired perceived functional ability, Impaired flexibility, Improper body mechanics, Postural  dysfunction, Pain  Visit Diagnosis: Other abnormalities of gait and mobility  Abnormal posture  Unsteadiness on feet  Muscle weakness (generalized)     Problem List Patient Active Problem List   Diagnosis Date Noted   Mitral regurgitation 01/12/2021   Anemia due to chronic blood loss 01/10/2021   Benign prostatic hyperplasia 01/10/2021   Benign prostatic hyperplasia with lower urinary tract symptoms 01/10/2021   Chronic kidney disease, stage 3 unspecified ([place]) 01/10/2021   Peripheral edema 01/10/2021   Enlarged prostate 01/10/2021   Hardening of the aorta (main artery of the heart) ([place]) 01/10/2021   Hypoalbuminemia 01/10/2021   Impaired fasting glucose 01/10/2021   Iron deficiency anemia 01/10/2021   Kidney stone 01/10/2021   Mitral valve disorder 01/10/2021   Osteopenia 01/10/2021   Other specified disorders of kidney and ureter 01/10/2021   Peripheral neuralgia 01/10/2021   Sciatica 01/10/2021   Personal history of colonic polyps 01/10/2021   Personal history of other diseases of the respiratory system 01/10/2021   Pure hypercholesterolemia 01/10/2021   Thrombocytopenia ([place]) 01/10/2021   Tubular adenoma of colon 01/10/2021   Unspecified abnormal finding in specimens from other organs, systems and tissues 01/10/2021   Edema 01/10/2021   Old myocardial infarction 01/10/2021   Osteoarthrosis 01/10/2021   Intermittent tremor 01/10/2021   Arthritis    History of kidney stones    Hypertension    Parkinson's disease ([place])    Occasional tremors 07/26/2020   Chronic bilateral low back pain 07/26/2020   Sensorineural hearing loss (SNHL) of both ears 06/15/2020   Muscle pain 03/01/2020   Degenerative lumbar spinal stenosis 09/22/2019   Body mass index (BMI) 25.0-25.9, adult 08/31/2019   Scoliosis concern 08/31/2019   Lumbar stenosis with neurogenic claudication 08/31/2019   Spinal stenosis, lumbar region without neurogenic claudication 08/19/2019   Hypotension  06/23/2019   Myocarditis ([place]) 05/19/2019   CAD (coronary artery disease) 04/20/2019   Obesity 04/06/2019   Malnutrition of moderate degree 03/23/2019   NSTEMI (non-ST elevated myocardial infarction) ([place]) 03/21/2019   Myocardial infarction ([place]) 03/21/2019   Anemia 09/21/2017   Symptomatic anemia 09/20/2017   Protein-calorie malnutrition, severe 09/11/2017   Empyema lung ([place]) 09/09/2017   SIRS (systemic inflammatory response syndrome) ([place]) 09/05/2017   Elevated troponin 09/05/2017   Normochromic normocytic anemia 09/05/2017   Hyponatremia 09/05/2017   Acute kidney injury superimposed on chronic kidney disease ([place]) 09/05/2017   Chronic pain 09/05/2017   Pleural effusion, left 09/04/2017   Pleural effusion on left 08/19/2017   Sinusitis, chronic 08/19/2017   Fall in home 07/10/2017   Murmur 01/08/2017   Skin ulcer of toe of left foot, limited to breakdown of skin ([place]) 09/04/2016   Displacement of cervical intervertebral disc 07/19/2014   Cervical myelopathy with cervical radiculopathy ([place]) 06/24/2014   LBBB (left bundle branch block) 06/21/2014   Hyperlipidemia 06/21/2014   Preoperative cardiovascular examination 06/21/2014   Degenerative cervical spinal stenosis 05/26/2014   Cervical spondylosis without myelopathy 05/12/2014   Neck pain 03/03/2014   Shoulder joint pain 12/01/2013   Spinal stenosis of lumbar region 08/10/2013   Chronic low back pain 07/29/2013  Lumbar radiculopathy 07/29/2013   Lumbar post-laminectomy syndrome 07/15/2013   Obstructive sleep apnea 09/30/2010   Sleep apnea 2012   Essential hypertension 08/29/2010    Percival Spanish, PT 10/06/2021, 12:23 PM  [facility] [address]  [place], [place], [address] Phone: [phone]   Fax:  [phone]  Name: Aaron Schmidt MRN: [id number] Date of Birth: [birth year]

## 2021-10-12 ENCOUNTER — Encounter: Payer: Self-pay | Admitting: Physical Therapy

## 2021-10-12 ENCOUNTER — Ambulatory Visit: Payer: Medicare Other | Admitting: Physical Therapy

## 2021-10-12 ENCOUNTER — Other Ambulatory Visit: Payer: Self-pay

## 2021-10-12 DIAGNOSIS — R2681 Unsteadiness on feet: Secondary | ICD-10-CM

## 2021-10-12 DIAGNOSIS — R2689 Other abnormalities of gait and mobility: Secondary | ICD-10-CM

## 2021-10-12 DIAGNOSIS — M6281 Muscle weakness (generalized): Secondary | ICD-10-CM

## 2021-10-12 DIAGNOSIS — R293 Abnormal posture: Secondary | ICD-10-CM

## 2021-10-12 NOTE — Patient Instructions (Signed)
Access Code: [email or web address] URL: [email or web address] Date: 10/12/2021 Prepared by: Annie Paras  Exercises Seated Hamstring Stretch - 2-3 x daily - 7 x weekly - 3 reps - 30 sec hold Seated Hip Flexor Stretch - 2-3 x daily - 7 x weekly - 3 reps - 30 sec hold  Patient Education [employer]

## 2021-10-12 NOTE — Therapy (Signed)
[facility] [address]  [place], [place], [address] Phone: [phone]   Fax:  [phone]  Physical Therapy Treatment  Patient Details  Name: Aaron Schmidt MRN: [id number] Date of Birth: [birth year] Referring Provider (PT): Alonza Bogus, DO   Encounter Date: 10/12/2021   PT End of Session - 10/12/21 1109     Visit Number 4    Number of Visits 12    Date for PT Re-Evaluation 11/03/21    Authorization Type U[employer] Medicare    PT Start Time 1109    PT Stop Time 1149    PT Time Calculation (min) 40 min    Activity Tolerance Patient tolerated treatment well    Behavior During Therapy [facility] for tasks assessed/performed             Past Medical History:  Diagnosis Date   Acute kidney injury superimposed on chronic kidney disease ([place]) 09/05/2017   Anemia 09/21/2017   Anemia due to chronic blood loss 01/10/2021   Arthritis    Degeneration spine & stenosis   Benign prostatic hyperplasia 01/10/2021   Benign prostatic hyperplasia with lower urinary tract symptoms 01/10/2021   Body mass index (BMI) 25.0-25.9, adult 08/31/2019   CAD (coronary artery disease) 04/20/2019   Cervical myelopathy with cervical radiculopathy ([place]) 06/24/2014   Cervical spondylosis without myelopathy 05/12/2014   Chronic bilateral low back pain 07/26/2020   Chronic kidney disease, stage 3 unspecified ([place]) 01/10/2021   Chronic low back pain 07/29/2013   Chronic pain 09/05/2017   Degenerative cervical spinal stenosis 05/26/2014   Degenerative lumbar spinal stenosis 09/22/2019   Displacement of cervical intervertebral disc 07/19/2014   Edema 01/10/2021   Elevated troponin 09/05/2017   Empyema lung ([place]) 09/09/2017   Enlarged prostate 01/10/2021   Essential hypertension 08/29/2010   Qualifier: Diagnosis of  By: Elsworth Soho MD, Leanna Sato     Fall in home 07/10/2017   Hardening of the aorta (main artery of the heart) ([place]) 01/10/2021   History of kidney stones     Hyperlipidemia 06/21/2014   Hypertension    has a histroy of   Hypoalbuminemia 01/10/2021   Hyponatremia 09/05/2017   Hypotension    Impaired fasting glucose 01/10/2021   Intermittent tremor 01/10/2021   Iron deficiency anemia 01/10/2021   Kidney stone 01/10/2021   LBBB (left bundle branch block) 06/21/2014   Lumbar post-laminectomy syndrome 07/15/2013   Lumbar radiculopathy 07/29/2013   Lumbar stenosis with neurogenic claudication 08/31/2019   Malnutrition of moderate degree 03/23/2019   Mitral valve disorder 01/10/2021   Murmur 01/08/2017   Muscle pain 03/01/2020   Myocardial infarction ([place]) 03/21/2019   Neck pain 03/03/2014   Normochromic normocytic anemia 09/05/2017   NSTEMI (non-ST elevated myocardial infarction) ([place]) 03/21/2019   Obesity 04/06/2019   Obstructive sleep apnea 09/30/2010   PSG (180 lbs ) >> severe obstructive sleep apnea with AHi 38/h & desatns to 82%  Started on auto 5-15  >> changed to 8 cm Download reviewed 1/21 - 11/23/10 >> residual events of 6/h , avg pr 8 cm, leak + with nasal pillows     Occasional tremors 07/26/2020   Old myocardial infarction 01/10/2021   Osteoarthrosis 01/10/2021   Osteopenia 01/10/2021   Other specified disorders of kidney and ureter 01/10/2021   Parkinson's disease ([place])    Peripheral edema 01/10/2021   Peripheral neuralgia 01/10/2021   Personal history of colonic polyps 01/10/2021   Personal history of other diseases of the respiratory  system 01/10/2021   Pleural effusion on left 08/19/2017   Acute symptoms since 08/13/17 > CTa  08/13/17  neg pe/ extensively loculated effusion - L thoracentesis 08/20/2017 :  220 cc with glucose < 20 and WBC 5,338 mostly Poly, LDH 688 > cyt neg >Referrd to T surgery > VATS 09/09/17 c/w empyema   Pleural effusion, left 09/04/2017   Preoperative cardiovascular examination 06/21/2014   Protein-calorie malnutrition, severe 09/11/2017   Pure hypercholesterolemia 01/10/2021   Sciatica 01/10/2021   Scoliosis concern 08/31/2019    Sensorineural hearing loss (SNHL) of both ears 06/15/2020   Last Assessment & Plan:  Formatting of this note might be different from the original. Concern over worsening hearing. Chronic history of sensorineural hearing loss bilaterally.  Has hearing aids currently.  He feels his hearing aids are not working as well as they historically have.  Recently had them checked. EXAM shows normal external canals and tympanic membranes bilaterally. AUDIOGRAM Shows B   Shoulder joint pain 12/01/2013   Sinusitis, chronic 08/19/2017   CT sinus  08/20/2017 >>> Normally aerated paranasal sinuses.  Patent sinus drainage pathways.   SIRS (systemic inflammatory response syndrome) ([place]) 09/05/2017   Skin ulcer of toe of left foot, limited to breakdown of skin ([place]) 09/04/2016   Sleep apnea 2012   used CPAP 2 yrs. ago, feels he sleeps better w/o, no longer using    Spinal stenosis of lumbar region 08/10/2013   Spinal stenosis, lumbar region without neurogenic claudication 08/19/2019   Symptomatic anemia 09/20/2017   Thrombocytopenia ([place]) 01/10/2021   Tubular adenoma of colon 01/10/2021   Unspecified abnormal finding in specimens from other organs, systems and tissues 01/10/2021    Past Surgical History:  Procedure Laterality Date   ANTERIOR LAT LUMBAR FUSION N/A 09/22/2019   Procedure: Lumbar one-two, Lumbar two-three Anterolateral lumbar interbody fusion with pedicle screw fixation and exploration of adjacent fusion;  Surgeon: Erline Levine, MD;  Location: [facility];  Service: Neurosurgery;  Laterality: N/A;   ARM NEUROPLASTY     at 14 yrs. of age- fell off tractor- had fracture & repair *& later- 1990's had  transplantation of a nerve at the elbow   BACK SURGERY     x4 back surgery x2 fusion -   CARDIAC CATHETERIZATION  03/23/2019   CARPAL TUNNEL RELEASE Right    COLONOSCOPY     ESOPHAGOGASTRODUODENOSCOPY (EGD) WITH PROPOFOL N/A 09/23/2017   Procedure: ESOPHAGOGASTRODUODENOSCOPY (EGD) WITH PROPOFOL;  Surgeon:  Laurence Spates, MD;  Location: [facility];  Service: Endoscopy;  Laterality: N/A;   LEFT HEART CATH AND CORONARY ANGIOGRAPHY N/A 03/23/2019   Procedure: LEFT HEART CATH AND CORONARY ANGIOGRAPHY;  Surgeon: Burnell Blanks, MD;  Location: [facility];  Service: Cardiovascular;  Laterality: N/A;   LUMBAR PERCUTANEOUS PEDICLE SCREW 2 LEVEL N/A 09/22/2019   Procedure: LUMBAR PERCUTANEOUS PEDICLE SCREW TWO LEVEL;  Surgeon: Erline Levine, MD;  Location: [facility];  Service: Neurosurgery;  Laterality: N/A;   PLEURAL EFFUSION DRAINAGE Left 09/09/2017   Procedure: DRAINAGE OF PLEURAL EFFUSION;  Surgeon: [name], MD;  Location: [facility];  Service: Thoracic;  Laterality: Left;   POSTERIOR CERVICAL FUSION/FORAMINOTOMY N/A 06/24/2014   Procedure: Posterior Cervical Three-Seven Fusion with Lateral Mass Fixation;  Surgeon: Erline Levine, MD;  Location: [facility];  Service: Neurosurgery;  Laterality: N/A;  C3-C7 posterior cervical fusion with lateral mass fixation   SHOULDER SURGERY Bilateral    VIDEO ASSISTED THORACOSCOPY (VATS)/DECORTICATION Left 09/09/2017   Procedure: VIDEO ASSISTED THORACOSCOPY (VATS)/DECORTICATION;  Surgeon: [name], MD;  [facility];  Service: Thoracic;  Laterality: Left;   VIDEO BRONCHOSCOPY  09/09/2017   Procedure: VIDEO BRONCHOSCOPY;  Surgeon: [name], MD;  Location: [facility];  Service: Thoracic;;    There were no vitals filed for this visit.   Subjective Assessment - 10/12/21 1110     Subjective Pt reports the "hot spot" on his back is flared up today after sitting for 1.5 hrs in a straight back chair while at a lecture at [facility] yesterday.    Pertinent History Parkinson's disease, frequent falls, chronic LBP, history of multiple back/neck surgeries and fusions.    Currently in Pain? Yes    Pain Score 4     Pain Location Back    Pain Orientation Lower    Pain Descriptors / Indicators Other (Comment)   "hot spot"   Pain Type  Chronic pain                               [facility] Adult PT Treatment/Exercise - 10/12/21 1109       Lumbar Exercises: Aerobic   Nustep L4 x 4 min (RUE/B LE to promote reciprocal movement patterns - unable to use L arm as he does not feel like he reach out far enough with it)   pt noitng L leg feels weak after using NuStep     Manual Therapy   Manual Therapy Soft tissue mobilization;Myofascial release    Soft tissue mobilization STM/DTM to L thoracic paraspinals, rhomboids, middle/lower traps &  subscapularis    Myofascial Release manual TPR to L lower thoracic paraspinals, rhomboids, middle/lower traps & subscapularis                 Balance Exercises - 10/12/21 1109       OTAGO PROGRAM   Head Movements Standing;5 reps   UE support on counter   Neck Movements --   deferreed d/t h/o cervical fusion   Back Extension Standing;5 reps   UE support on counter   Trunk Movements Standing;5 reps   UE support on counter   Ankle Movements Sitting;10 reps    Knee Extensor 10 reps    Knee Flexor 10 reps    Hip ABductor 10 reps    Ankle Plantorflexors --   10 reps, support   Ankle Dorsiflexors --   10 reps, support   Knee Bends 10 reps, support    Backwards Walking Support    Walking and Turning Around Research scientist (physical sciences) Walking Assistive device                PT Education - 10/12/21 1149     Education Details Initiation of [employer] Program    Person(s) Educated Patient    Methods Explanation;Demonstration;Verbal cues;Tactile cues;Handout    Comprehension Verbalized understanding;Verbal cues required;Tactile cues required;Returned demonstration;Need further instruction              PT Short Term Goals - 10/12/21 1145       PT SHORT TERM GOAL #1   Title Patient will be independent with initial HEP    Status Partially Met   10/12/21 - Met for stretches but pt reporting seated PWR! Moves are making his back worse   Target  Date 10/13/21               PT Long Term Goals - 10/02/21 1120  PT LONG TERM GOAL #1   Title Patient will be independent with ongoing/advanced HEP for self-management at home    Status On-going    Target Date 11/03/21      PT LONG TERM GOAL #2   Title Patient will demonstrate improved B LE strength to >/= 4/5 for improved stability and ease of mobility    Status On-going    Target Date 11/03/21      PT LONG TERM GOAL #3   Title Patient will ambulate with improved upright posture, increased stride length, improved foot clearance and increased gait speed to >/= 2.62 ft/sec with or w/o SPC and >/= 3.0 ft/sec with rollator to increase safety with community ambulation    Baseline 2.22 ft/sec w/o AD, 2.18 ft/sec with SPC, 2.73 ft/sec with rollator    Status On-going    Target Date 11/03/21      PT LONG TERM GOAL #4   Title Patient will improve 5x STS time to </= 20 seconds for improved efficiency and safety with transfers    Baseline 5xSTS = 26.65 sec    Status On-going    Target Date 11/03/21      PT LONG TERM GOAL #5   Title Patient will demonstrate decreased TUG time to </= 14 sec to decrease risk for falls with transitional mobility    Baseline TUG = 17.9 sec    Status On-going    Target Date 11/03/21      PT LONG TERM GOAL #6   Title Patient will improve Berg score to >/= 45/56 to improve safety and stability with ADLs in standing and reduce risk for falls    Baseline Berg = 36/56    Status On-going    Target Date 11/03/21                   Plan - 10/12/21 1149     Clinical Impression Statement Aaron Schmidt reports he feels that therapy is making his back pain worse, in particular when he attempts the PWR! Moves in sitting, but feels the stretches are somewhat helpful - STG partially met. Deferred PWR! Moves from HEP and shifted focus to [facility] to encourage gentle postural corrections, LE strengthening and balance activities to improve  stability and decrease fall risk hopefully w/o irritating his back in the process. [place] booklet provided with desired activities highlighted and notes made with modifications/adjustments to ensure good safety and minimize irritation for his back. Unable to complete full training in all desired [place] activities, therefore requested pt bring booklet back with him on next visit for further training as well as review as needed. Last few minutes of visit spent addressing the hot spot in his back and periscapular muscles near the inferior angle of his L scapula.    Comorbidities Parkinson's, arthritis, chronic pain, multiple back surgeries/fusions, CKD, falls    Rehab Potential Fair    PT Frequency 2x / week    PT Duration 6 weeks    PT Treatment/Interventions ADLs/Self Care Home Management;Cryotherapy;Electrical Stimulation;Iontophoresis 37m/ml Dexamethasone;Moist Heat;Ultrasound;DME Instruction;Gait training;Stair training;Functional mobility training;Therapeutic activities;Therapeutic exercise;Balance training;Neuromuscular re-education;Patient/family education;Manual techniques;Passive range of motion;Dry needling;Energy conservation;Taping    PT Next Visit Plan Review and complete training in [place]; gently progress LE/core flexibility and strengthening, incorporating balance activities +/- PWR! Moves as tolerated    PT Home Exercise Plan Access Code: [email or web address](1/9);  [place]program (1/19)    Consulted and Agree with Plan of Care Patient  Patient will benefit from skilled therapeutic intervention in order to improve the following deficits and impairments:  Abnormal gait, Cardiopulmonary status limiting activity, Decreased activity tolerance, Decreased balance, Decreased coordination, Decreased endurance, Decreased knowledge of use of DME, Decreased mobility, Decreased range of motion, Decreased strength, Difficulty walking, Hypomobility, Increased fascial restricitons, Increased  muscle spasms, Impaired perceived functional ability, Impaired flexibility, Improper body mechanics, Postural dysfunction, Pain  Visit Diagnosis: Other abnormalities of gait and mobility  Abnormal posture  Unsteadiness on feet  Muscle weakness (generalized)     Problem List Patient Active Problem List   Diagnosis Date Noted   Mitral regurgitation 01/12/2021   Anemia due to chronic blood loss 01/10/2021   Benign prostatic hyperplasia 01/10/2021   Benign prostatic hyperplasia with lower urinary tract symptoms 01/10/2021   Chronic kidney disease, stage 3 unspecified ([place]) 01/10/2021   Peripheral edema 01/10/2021   Enlarged prostate 01/10/2021   Hardening of the aorta (main artery of the heart) ([place]) 01/10/2021   Hypoalbuminemia 01/10/2021   Impaired fasting glucose 01/10/2021   Iron deficiency anemia 01/10/2021   Kidney stone 01/10/2021   Mitral valve disorder 01/10/2021   Osteopenia 01/10/2021   Other specified disorders of kidney and ureter 01/10/2021   Peripheral neuralgia 01/10/2021   Sciatica 01/10/2021   Personal history of colonic polyps 01/10/2021   Personal history of other diseases of the respiratory system 01/10/2021   Pure hypercholesterolemia 01/10/2021   Thrombocytopenia ([place]) 01/10/2021   Tubular adenoma of colon 01/10/2021   Unspecified abnormal finding in specimens from other organs, systems and tissues 01/10/2021   Edema 01/10/2021   Old myocardial infarction 01/10/2021   Osteoarthrosis 01/10/2021   Intermittent tremor 01/10/2021   Arthritis    History of kidney stones    Hypertension    Parkinson's disease ([place])    Occasional tremors 07/26/2020   Chronic bilateral low back pain 07/26/2020   Sensorineural hearing loss (SNHL) of both ears 06/15/2020   Muscle pain 03/01/2020   Degenerative lumbar spinal stenosis 09/22/2019   Body mass index (BMI) 25.0-25.9, adult 08/31/2019   Scoliosis concern 08/31/2019   Lumbar stenosis with neurogenic  claudication 08/31/2019   Spinal stenosis, lumbar region without neurogenic claudication 08/19/2019   Hypotension 06/23/2019   Myocarditis ([place]) 05/19/2019   CAD (coronary artery disease) 04/20/2019   Obesity 04/06/2019   Malnutrition of moderate degree 03/23/2019   NSTEMI (non-ST elevated myocardial infarction) ([place]) 03/21/2019   Myocardial infarction ([place]) 03/21/2019   Anemia 09/21/2017   Symptomatic anemia 09/20/2017   Protein-calorie malnutrition, severe 09/11/2017   Empyema lung ([place]) 09/09/2017   SIRS (systemic inflammatory response syndrome) ([place]) 09/05/2017   Elevated troponin 09/05/2017   Normochromic normocytic anemia 09/05/2017   Hyponatremia 09/05/2017   Acute kidney injury superimposed on chronic kidney disease ([place]) 09/05/2017   Chronic pain 09/05/2017   Pleural effusion, left 09/04/2017   Pleural effusion on left 08/19/2017   Sinusitis, chronic 08/19/2017   Fall in home 07/10/2017   Murmur 01/08/2017   Skin ulcer of toe of left foot, limited to breakdown of skin ([place]) 09/04/2016   Displacement of cervical intervertebral disc 07/19/2014   Cervical myelopathy with cervical radiculopathy ([place]) 06/24/2014   LBBB (left bundle branch block) 06/21/2014   Hyperlipidemia 06/21/2014   Preoperative cardiovascular examination 06/21/2014   Degenerative cervical spinal stenosis 05/26/2014   Cervical spondylosis without myelopathy 05/12/2014   Neck pain 03/03/2014   Shoulder joint pain 12/01/2013   Spinal stenosis of lumbar region 08/10/2013   Chronic low back pain 07/29/2013  Lumbar radiculopathy 07/29/2013   Lumbar post-laminectomy syndrome 07/15/2013   Obstructive sleep apnea 09/30/2010   Sleep apnea 2012   Essential hypertension 08/29/2010    Percival Spanish, PT 10/12/2021, 12:17 PM  [facility] [address]  [place], [place], [address] Phone: [phone]   Fax:  [phone]  Name: Aaron Schmidt MRN: [id number] Date of Birth: [birth year]

## 2021-10-16 ENCOUNTER — Ambulatory Visit: Payer: Medicare Other | Admitting: Physical Therapy

## 2021-10-16 ENCOUNTER — Encounter: Payer: Self-pay | Admitting: Physical Therapy

## 2021-10-16 ENCOUNTER — Other Ambulatory Visit: Payer: Self-pay

## 2021-10-16 DIAGNOSIS — R2681 Unsteadiness on feet: Secondary | ICD-10-CM

## 2021-10-16 DIAGNOSIS — R2689 Other abnormalities of gait and mobility: Secondary | ICD-10-CM | POA: Diagnosis not present

## 2021-10-16 DIAGNOSIS — R293 Abnormal posture: Secondary | ICD-10-CM

## 2021-10-16 DIAGNOSIS — M6281 Muscle weakness (generalized): Secondary | ICD-10-CM

## 2021-10-16 NOTE — Therapy (Signed)
[facility] [address]  [place], [place], [address] Phone: [phone]   Fax:  [phone]  Physical Therapy Treatment  Patient Details  Name: Aaron Schmidt MRN: [id number] Date of Birth: 1932-11-26 Referring Provider (PT): Alonza Bogus, DO   Encounter Date: 10/16/2021   PT End of Session - 10/16/21 1105     Visit Number 5    Number of Visits 12    Date for PT Re-Evaluation 11/03/21    Authorization Type [employer] Medicare    PT Start Time 1105   Pt arrived late   PT Stop Time 1146    PT Time Calculation (min) 41 min    Activity Tolerance Patient tolerated treatment well    Behavior During Therapy [facility] for tasks assessed/performed             Past Medical History:  Diagnosis Date   Acute kidney injury superimposed on chronic kidney disease ([place]) 09/05/2017   Anemia 09/21/2017   Anemia due to chronic blood loss 01/10/2021   Arthritis    Degeneration spine & stenosis   Benign prostatic hyperplasia 01/10/2021   Benign prostatic hyperplasia with lower urinary tract symptoms 01/10/2021   Body mass index (BMI) 25.0-25.9, adult 08/31/2019   CAD (coronary artery disease) 04/20/2019   Cervical myelopathy with cervical radiculopathy ([place]) 06/24/2014   Cervical spondylosis without myelopathy 05/12/2014   Chronic bilateral low back pain 07/26/2020   Chronic kidney disease, stage 3 unspecified ([place]) 01/10/2021   Chronic low back pain 07/29/2013   Chronic pain 09/05/2017   Degenerative cervical spinal stenosis 05/26/2014   Degenerative lumbar spinal stenosis 09/22/2019   Displacement of cervical intervertebral disc 07/19/2014   Edema 01/10/2021   Elevated troponin 09/05/2017   Empyema lung ([place]) 09/09/2017   Enlarged prostate 01/10/2021   Essential hypertension 08/29/2010   Qualifier: Diagnosis of  By: Elsworth Soho MD, Leanna Sato     Fall in home 07/10/2017   Hardening of the aorta (main artery of the heart) ([place]) 01/10/2021   History of  kidney stones    Hyperlipidemia 06/21/2014   Hypertension    has a histroy of   Hypoalbuminemia 01/10/2021   Hyponatremia 09/05/2017   Hypotension    Impaired fasting glucose 01/10/2021   Intermittent tremor 01/10/2021   Iron deficiency anemia 01/10/2021   Kidney stone 01/10/2021   LBBB (left bundle branch block) 06/21/2014   Lumbar post-laminectomy syndrome 07/15/2013   Lumbar radiculopathy 07/29/2013   Lumbar stenosis with neurogenic claudication 08/31/2019   Malnutrition of moderate degree 03/23/2019   Mitral valve disorder 01/10/2021   Murmur 01/08/2017   Muscle pain 03/01/2020   Myocardial infarction ([place]) 03/21/2019   Neck pain 03/03/2014   Normochromic normocytic anemia 09/05/2017   NSTEMI (non-ST elevated myocardial infarction) ([place]) 03/21/2019   Obesity 04/06/2019   Obstructive sleep apnea 09/30/2010   PSG (180 lbs ) >> severe obstructive sleep apnea with AHi 38/h & desatns to 82%  Started on auto 5-15  >> changed to 8 cm Download reviewed 1/21 - 11/23/10 >> residual events of 6/h , avg pr 8 cm, leak + with nasal pillows     Occasional tremors 07/26/2020   Old myocardial infarction 01/10/2021   Osteoarthrosis 01/10/2021   Osteopenia 01/10/2021   Other specified disorders of kidney and ureter 01/10/2021   Parkinson's disease ([place])    Peripheral edema 01/10/2021   Peripheral neuralgia 01/10/2021   Personal history of colonic polyps 01/10/2021   Personal history of other  diseases of the respiratory system 01/10/2021   Pleural effusion on left 08/19/2017   Acute symptoms since 08/13/17 > CTa  08/13/17  neg pe/ extensively loculated effusion - L thoracentesis 08/20/2017 :  220 cc with glucose < 20 and WBC 5,338 mostly Poly, LDH 688 > cyt neg >Referrd to T surgery > VATS 09/09/17 c/w empyema   Pleural effusion, left 09/04/2017   Preoperative cardiovascular examination 06/21/2014   Protein-calorie malnutrition, severe 09/11/2017   Pure hypercholesterolemia 01/10/2021   Sciatica 01/10/2021   Scoliosis  concern 08/31/2019   Sensorineural hearing loss (SNHL) of both ears 06/15/2020   Last Assessment & Plan:  Formatting of this note might be different from the original. Concern over worsening hearing. Chronic history of sensorineural hearing loss bilaterally.  Has hearing aids currently.  He feels his hearing aids are not working as well as they historically have.  Recently had them checked. EXAM shows normal external canals and tympanic membranes bilaterally. AUDIOGRAM Shows B   Shoulder joint pain 12/01/2013   Sinusitis, chronic 08/19/2017   CT sinus  08/20/2017 >>> Normally aerated paranasal sinuses.  Patent sinus drainage pathways.   SIRS (systemic inflammatory response syndrome) ([place]) 09/05/2017   Skin ulcer of toe of left foot, limited to breakdown of skin ([place]) 09/04/2016   Sleep apnea 2012   used CPAP 2 yrs. ago, feels he sleeps better w/o, no longer using    Spinal stenosis of lumbar region 08/10/2013   Spinal stenosis, lumbar region without neurogenic claudication 08/19/2019   Symptomatic anemia 09/20/2017   Thrombocytopenia ([place]) 01/10/2021   Tubular adenoma of colon 01/10/2021   Unspecified abnormal finding in specimens from other organs, systems and tissues 01/10/2021    Past Surgical History:  Procedure Laterality Date   ANTERIOR LAT LUMBAR FUSION N/A 09/22/2019   Procedure: Lumbar one-two, Lumbar two-three Anterolateral lumbar interbody fusion with pedicle screw fixation and exploration of adjacent fusion;  Surgeon: Erline Levine, MD;  Location: [facility];  Service: Neurosurgery;  Laterality: N/A;   ARM NEUROPLASTY     at 14 yrs. of age- fell off tractor- had fracture & repair *& later- 1990's had  transplantation of a nerve at the elbow   BACK SURGERY     x4 back surgery x2 fusion -   CARDIAC CATHETERIZATION  03/23/2019   CARPAL TUNNEL RELEASE Right    COLONOSCOPY     ESOPHAGOGASTRODUODENOSCOPY (EGD) WITH PROPOFOL N/A 09/23/2017   Procedure: ESOPHAGOGASTRODUODENOSCOPY (EGD) WITH  PROPOFOL;  Surgeon: Laurence Spates, MD;  Location: [facility];  Service: Endoscopy;  Laterality: N/A;   LEFT HEART CATH AND CORONARY ANGIOGRAPHY N/A 03/23/2019   Procedure: LEFT HEART CATH AND CORONARY ANGIOGRAPHY;  Surgeon: Burnell Blanks, MD;  Location: [facility];  Service: Cardiovascular;  Laterality: N/A;   LUMBAR PERCUTANEOUS PEDICLE SCREW 2 LEVEL N/A 09/22/2019   Procedure: LUMBAR PERCUTANEOUS PEDICLE SCREW TWO LEVEL;  Surgeon: Erline Levine, MD;  Location: [facility];  Service: Neurosurgery;  Laterality: N/A;   PLEURAL EFFUSION DRAINAGE Left 09/09/2017   Procedure: DRAINAGE OF PLEURAL EFFUSION;  Surgeon: [name], MD;  Location: [facility];  Service: Thoracic;  Laterality: Left;   POSTERIOR CERVICAL FUSION/FORAMINOTOMY N/A 06/24/2014   Procedure: Posterior Cervical Three-Seven Fusion with Lateral Mass Fixation;  Surgeon: Erline Levine, MD;  Location: [facility];  Service: Neurosurgery;  Laterality: N/A;  C3-C7 posterior cervical fusion with lateral mass fixation   SHOULDER SURGERY Bilateral    VIDEO ASSISTED THORACOSCOPY (VATS)/DECORTICATION Left 09/09/2017   Procedure: VIDEO  ASSISTED THORACOSCOPY (VATS)/DECORTICATION;  Surgeon: [name], MD;  Location: [facility];  Service: Thoracic;  Laterality: Left;   VIDEO BRONCHOSCOPY  09/09/2017   Procedure: VIDEO BRONCHOSCOPY;  Surgeon: [name], MD;  Location: [facility];  Service: Thoracic;;    There were no vitals filed for this visit.   Subjective Assessment - 10/16/21 1106     Subjective Pt reports he attempted the [place] exercises 1x since last visit - notes increased LBP following home performance.    Pertinent History Parkinson's disease, frequent falls, chronic LBP, history of multiple back/neck surgeries and fusions.    Patient Stated Goals "to get the 'hot spot' so I can stand & sit better"    Currently in Pain? Yes    Pain Score 6     Pain Location Back    Pain Orientation Lower    Pain  Descriptors / Indicators Tiring   "hard to describe"   Pain Type Chronic pain                               [facility] Adult PT Treatment/Exercise - 10/16/21 1105       Lumbar Exercises: Aerobic   Nustep L4 x 4 min (RUE/B LE to promote reciprocal movement patterns - unable to use L arm as he does not feel like he reach out far enough with it)   pt noitng L leg feels tired after using NuStep                Balance Exercises - 10/16/21 1105       OTAGO PROGRAM   Head Movements Sitting;5 reps    Ankle Movements Sitting;10 reps   heel resting on floor rather than knee extended with foot in air to reduce low back strain   Knee Extensor 5 reps   2 sets   Knee Flexor 5 reps   2 sets   Hip ABductor 10 reps    Ankle Plantorflexors --   10 reps, support   Ankle Dorsiflexors --   10 reps, support   Knee Bends 10 reps, support    Tandem Stance 10 seconds, support    Tandem Walk Support    One Leg Stand 10 seconds, support    Heel Walking Support   deferred from HEP   Toe Walk Support    Heel Toe Walking Backward --   Level C=Support   Sit to Stand --   5 reps each - 2 hand support, 1 hand support, no suppport - pt feeling most comfortable with 1 hand support                 PT Short Term Goals - 10/12/21 1145       PT SHORT TERM GOAL #1   Title Patient will be independent with initial HEP    Status Partially Met   10/12/21 - Met for stretches but pt reporting seated PWR! Moves are making his back worse   Target Date 10/13/21               PT Long Term Goals - 10/02/21 1120       PT LONG TERM GOAL #1   Title Patient will be independent with ongoing/advanced HEP for self-management at home    Status On-going    Target Date 11/03/21      PT LONG TERM GOAL #2   Title Patient will demonstrate improved B LE strength  to >/= 4/5 for improved stability and ease of mobility    Status On-going    Target Date 11/03/21      PT LONG TERM GOAL #3    Title Patient will ambulate with improved upright posture, increased stride length, improved foot clearance and increased gait speed to >/= 2.62 ft/sec with or w/o SPC and >/= 3.0 ft/sec with rollator to increase safety with community ambulation    Baseline 2.22 ft/sec w/o AD, 2.18 ft/sec with SPC, 2.73 ft/sec with rollator    Status On-going    Target Date 11/03/21      PT LONG TERM GOAL #4   Title Patient will improve 5x STS time to </= 20 seconds for improved efficiency and safety with transfers    Baseline 5xSTS = 26.65 sec    Status On-going    Target Date 11/03/21      PT LONG TERM GOAL #5   Title Patient will demonstrate decreased TUG time to </= 14 sec to decrease risk for falls with transitional mobility    Baseline TUG = 17.9 sec    Status On-going    Target Date 11/03/21      PT LONG TERM GOAL #6   Title Patient will improve Berg score to >/= 45/56 to improve safety and stability with ADLs in standing and reduce risk for falls    Baseline Berg = 36/56    Status On-going    Target Date 11/03/21                   Plan - 10/16/21 1146     Clinical Impression Statement Aaron Schmidt reports he attempted the highlighted [place] Program activities once since last PT visit and as with everything else he feels that they are making his back pain worse. Reviewed initial [place] activities and provided cues/modifications as needed to lessen low back strain, emphasizing need for slower more controlled movements rather than forceful impulsive movements. Completed training in latter portion of [place] balance activities with only some activities highlighted for inclusion in HEP due to either c/o increased LBP or safety/stability with performance. Attempted discussion with pt to identify areas of concerns/issues related to mobility within ILF but limited input/feedback received from pt other than c/o limited walking tolerance due to LBP even when using rollator, likely due to forward flexed  posture - will plan for trial of gait with walking/hiking poles next visit to promote more upright posture for decreased low back strain.    Comorbidities Parkinson's, arthritis, chronic pain, multiple back surgeries/fusions, CKD, falls    Rehab Potential Fair    PT Frequency 2x / week    PT Duration 6 weeks    PT Treatment/Interventions ADLs/Self Care Home Management;Cryotherapy;Electrical Stimulation;Iontophoresis 44m/ml Dexamethasone;Moist Heat;Ultrasound;DME Instruction;Gait training;Stair training;Functional mobility training;Therapeutic activities;Therapeutic exercise;Balance training;Neuromuscular re-education;Patient/family education;Manual techniques;Passive range of motion;Dry needling;Energy conservation;Taping    PT Next Visit Plan Review [place] program PRN; trial of gait with hiking poles to promote improved upright posture and reciprocal movement; gently progress LE/core flexibility and strengthening, incorporating balance activities +/- PWR! Moves as tolerated    PT Home Exercise Plan Access Code: [email or web address](1/9);  [place] (1/19)    Consulted and Agree with Plan of Care Patient             Patient will benefit from skilled therapeutic intervention in order to improve the following deficits and impairments:  Abnormal gait, Cardiopulmonary status limiting activity, Decreased activity tolerance, Decreased balance, Decreased coordination, Decreased endurance, Decreased knowledge of  use of DME, Decreased mobility, Decreased range of motion, Decreased strength, Difficulty walking, Hypomobility, Increased fascial restricitons, Increased muscle spasms, Impaired perceived functional ability, Impaired flexibility, Improper body mechanics, Postural dysfunction, Pain  Visit Diagnosis: Other abnormalities of gait and mobility  Abnormal posture  Unsteadiness on feet  Muscle weakness (generalized)     Problem List Patient Active Problem List   Diagnosis Date Noted   Mitral  regurgitation 01/12/2021   Anemia due to chronic blood loss 01/10/2021   Benign prostatic hyperplasia 01/10/2021   Benign prostatic hyperplasia with lower urinary tract symptoms 01/10/2021   Chronic kidney disease, stage 3 unspecified ([place]) 01/10/2021   Peripheral edema 01/10/2021   Enlarged prostate 01/10/2021   Hardening of the aorta (main artery of the heart) ([place]) 01/10/2021   Hypoalbuminemia 01/10/2021   Impaired fasting glucose 01/10/2021   Iron deficiency anemia 01/10/2021   Kidney stone 01/10/2021   Mitral valve disorder 01/10/2021   Osteopenia 01/10/2021   Other specified disorders of kidney and ureter 01/10/2021   Peripheral neuralgia 01/10/2021   Sciatica 01/10/2021   Personal history of colonic polyps 01/10/2021   Personal history of other diseases of the respiratory system 01/10/2021   Pure hypercholesterolemia 01/10/2021   Thrombocytopenia ([place]) 01/10/2021   Tubular adenoma of colon 01/10/2021   Unspecified abnormal finding in specimens from other organs, systems and tissues 01/10/2021   Edema 01/10/2021   Old myocardial infarction 01/10/2021   Osteoarthrosis 01/10/2021   Intermittent tremor 01/10/2021   Arthritis    History of kidney stones    Hypertension    Parkinson's disease ([place])    Occasional tremors 07/26/2020   Chronic bilateral low back pain 07/26/2020   Sensorineural hearing loss (SNHL) of both ears 06/15/2020   Muscle pain 03/01/2020   Degenerative lumbar spinal stenosis 09/22/2019   Body mass index (BMI) 25.0-25.9, adult 08/31/2019   Scoliosis concern 08/31/2019   Lumbar stenosis with neurogenic claudication 08/31/2019   Spinal stenosis, lumbar region without neurogenic claudication 08/19/2019   Hypotension 06/23/2019   Myocarditis ([place]) 05/19/2019   CAD (coronary artery disease) 04/20/2019   Obesity 04/06/2019   Malnutrition of moderate degree 03/23/2019   NSTEMI (non-ST elevated myocardial infarction) ([place]) 03/21/2019   Myocardial infarction  ([place]) 03/21/2019   Anemia 09/21/2017   Symptomatic anemia 09/20/2017   Protein-calorie malnutrition, severe 09/11/2017   Empyema lung ([place]) 09/09/2017   SIRS (systemic inflammatory response syndrome) ([place]) 09/05/2017   Elevated troponin 09/05/2017   Normochromic normocytic anemia 09/05/2017   Hyponatremia 09/05/2017   Acute kidney injury superimposed on chronic kidney disease ([place]) 09/05/2017   Chronic pain 09/05/2017   Pleural effusion, left 09/04/2017   Pleural effusion on left 08/19/2017   Sinusitis, chronic 08/19/2017   Fall in home 07/10/2017   Murmur 01/08/2017   Skin ulcer of toe of left foot, limited to breakdown of skin ([place]) 09/04/2016   Displacement of cervical intervertebral disc 07/19/2014   Cervical myelopathy with cervical radiculopathy ([place]) 06/24/2014   LBBB (left bundle branch block) 06/21/2014   Hyperlipidemia 06/21/2014   Preoperative cardiovascular examination 06/21/2014   Degenerative cervical spinal stenosis 05/26/2014   Cervical spondylosis without myelopathy 05/12/2014   Neck pain 03/03/2014   Shoulder joint pain 12/01/2013   Spinal stenosis of lumbar region 08/10/2013   Chronic low back pain 07/29/2013   Lumbar radiculopathy 07/29/2013   Lumbar post-laminectomy syndrome 07/15/2013   Obstructive sleep apnea 09/30/2010   Sleep apnea 2012   Essential hypertension 08/29/2010    Percival Spanish, PT 10/16/2021, 12:20  PM  [facility] [address]  [place], [place], [address] Phone: [phone]   Fax:  [phone]  Name: Aaron Schmidt MRN: [id number] Date of Birth: 02-08-1933

## 2021-10-20 ENCOUNTER — Ambulatory Visit: Payer: Medicare Other | Admitting: Physical Therapy

## 2021-10-20 ENCOUNTER — Other Ambulatory Visit: Payer: Self-pay

## 2021-10-20 ENCOUNTER — Encounter: Payer: Self-pay | Admitting: Physical Therapy

## 2021-10-20 DIAGNOSIS — R2689 Other abnormalities of gait and mobility: Secondary | ICD-10-CM | POA: Diagnosis not present

## 2021-10-20 DIAGNOSIS — R293 Abnormal posture: Secondary | ICD-10-CM

## 2021-10-20 DIAGNOSIS — M6281 Muscle weakness (generalized): Secondary | ICD-10-CM

## 2021-10-20 DIAGNOSIS — R2681 Unsteadiness on feet: Secondary | ICD-10-CM

## 2021-10-20 NOTE — Therapy (Signed)
[facility] [address]  [place], [place], [address] Phone: [phone]   Fax:  [phone]  Physical Therapy Treatment  Patient Details  Name: Aaron Schmidt MRN: [id number] Date of Birth: [birth year] Referring Provider (PT): Alonza Bogus, DO   Encounter Date: 10/20/2021   PT End of Session - 10/20/21 1105     Visit Number 6    Number of Visits 12    Date for PT Re-Evaluation 11/03/21    Authorization Type [employer] Medicare    PT Start Time 1105    PT Stop Time 1151    PT Time Calculation (min) 46 min    Activity Tolerance Patient tolerated treatment well    Behavior During Therapy [facility] for tasks assessed/performed             Past Medical History:  Diagnosis Date   Acute kidney injury superimposed on chronic kidney disease ([place]) 09/05/2017   Anemia 09/21/2017   Anemia due to chronic blood loss 01/10/2021   Arthritis    Degeneration spine & stenosis   Benign prostatic hyperplasia 01/10/2021   Benign prostatic hyperplasia with lower urinary tract symptoms 01/10/2021   Body mass index (BMI) 25.0-25.9, adult 08/31/2019   CAD (coronary artery disease) 04/20/2019   Cervical myelopathy with cervical radiculopathy ([place]) 06/24/2014   Cervical spondylosis without myelopathy 05/12/2014   Chronic bilateral low back pain 07/26/2020   Chronic kidney disease, stage 3 unspecified ([place]) 01/10/2021   Chronic low back pain 07/29/2013   Chronic pain 09/05/2017   Degenerative cervical spinal stenosis 05/26/2014   Degenerative lumbar spinal stenosis 09/22/2019   Displacement of cervical intervertebral disc 07/19/2014   Edema 01/10/2021   Elevated troponin 09/05/2017   Empyema lung ([place]) 09/09/2017   Enlarged prostate 01/10/2021   Essential hypertension 08/29/2010   Qualifier: Diagnosis of  By: Elsworth Soho MD, Leanna Sato     Fall in home 07/10/2017   Hardening of the aorta (main artery of the heart) ([place]) 01/10/2021   History of kidney stones     Hyperlipidemia 06/21/2014   Hypertension    has a histroy of   Hypoalbuminemia 01/10/2021   Hyponatremia 09/05/2017   Hypotension    Impaired fasting glucose 01/10/2021   Intermittent tremor 01/10/2021   Iron deficiency anemia 01/10/2021   Kidney stone 01/10/2021   LBBB (left bundle branch block) 06/21/2014   Lumbar post-laminectomy syndrome 07/15/2013   Lumbar radiculopathy 07/29/2013   Lumbar stenosis with neurogenic claudication 08/31/2019   Malnutrition of moderate degree 03/23/2019   Mitral valve disorder 01/10/2021   Murmur 01/08/2017   Muscle pain 03/01/2020   Myocardial infarction ([place]) 03/21/2019   Neck pain 03/03/2014   Normochromic normocytic anemia 09/05/2017   NSTEMI (non-ST elevated myocardial infarction) ([place]) 03/21/2019   Obesity 04/06/2019   Obstructive sleep apnea 09/30/2010   PSG (180 lbs ) >> severe obstructive sleep apnea with AHi 38/h & desatns to 82%  Started on auto 5-15  >> changed to 8 cm Download reviewed 1/21 - 11/23/10 >> residual events of 6/h , avg pr 8 cm, leak + with nasal pillows     Occasional tremors 07/26/2020   Old myocardial infarction 01/10/2021   Osteoarthrosis 01/10/2021   Osteopenia 01/10/2021   Other specified disorders of kidney and ureter 01/10/2021   Parkinson's disease ([place])    Peripheral edema 01/10/2021   Peripheral neuralgia 01/10/2021   Personal history of colonic polyps 01/10/2021   Personal history of other diseases of the respiratory  system 01/10/2021   Pleural effusion on left 08/19/2017   Acute symptoms since 08/13/17 > CTa  08/13/17  neg pe/ extensively loculated effusion - L thoracentesis 08/20/2017 :  220 cc with glucose < 20 and WBC 5,338 mostly Poly, LDH 688 > cyt neg >Referrd to T surgery > VATS 09/09/17 c/w empyema   Pleural effusion, left 09/04/2017   Preoperative cardiovascular examination 06/21/2014   Protein-calorie malnutrition, severe 09/11/2017   Pure hypercholesterolemia 01/10/2021   Sciatica 01/10/2021   Scoliosis concern 08/31/2019    Sensorineural hearing loss (SNHL) of both ears 06/15/2020   Last Assessment & Plan:  Formatting of this note might be different from the original. Concern over worsening hearing. Chronic history of sensorineural hearing loss bilaterally.  Has hearing aids currently.  He feels his hearing aids are not working as well as they historically have.  Recently had them checked. EXAM shows normal external canals and tympanic membranes bilaterally. AUDIOGRAM Shows B   Shoulder joint pain 12/01/2013   Sinusitis, chronic 08/19/2017   CT sinus  08/20/2017 >>> Normally aerated paranasal sinuses.  Patent sinus drainage pathways.   SIRS (systemic inflammatory response syndrome) ([place]) 09/05/2017   Skin ulcer of toe of left foot, limited to breakdown of skin ([place]) 09/04/2016   Sleep apnea 2012   used CPAP 2 yrs. ago, feels he sleeps better w/o, no longer using    Spinal stenosis of lumbar region 08/10/2013   Spinal stenosis, lumbar region without neurogenic claudication 08/19/2019   Symptomatic anemia 09/20/2017   Thrombocytopenia ([place]) 01/10/2021   Tubular adenoma of colon 01/10/2021   Unspecified abnormal finding in specimens from other organs, systems and tissues 01/10/2021    Past Surgical History:  Procedure Laterality Date   ANTERIOR LAT LUMBAR FUSION N/A 09/22/2019   Procedure: Lumbar one-two, Lumbar two-three Anterolateral lumbar interbody fusion with pedicle screw fixation and exploration of adjacent fusion;  Surgeon: Erline Levine, MD;  Location: [facility];  Service: Neurosurgery;  Laterality: N/A;   ARM NEUROPLASTY     at 14 yrs. of age- fell off tractor- had fracture & repair *& later- 1990's had  transplantation of a nerve at the elbow   BACK SURGERY     x4 back surgery x2 fusion -   CARDIAC CATHETERIZATION  03/23/2019   CARPAL TUNNEL RELEASE Right    COLONOSCOPY     ESOPHAGOGASTRODUODENOSCOPY (EGD) WITH PROPOFOL N/A 09/23/2017   Procedure: ESOPHAGOGASTRODUODENOSCOPY (EGD) WITH PROPOFOL;  Surgeon:  Laurence Spates, MD;  Location: [facility];  Service: Endoscopy;  Laterality: N/A;   LEFT HEART CATH AND CORONARY ANGIOGRAPHY N/A 03/23/2019   Procedure: LEFT HEART CATH AND CORONARY ANGIOGRAPHY;  Surgeon: Burnell Blanks, MD;  Location: [facility];  Service: Cardiovascular;  Laterality: N/A;   LUMBAR PERCUTANEOUS PEDICLE SCREW 2 LEVEL N/A 09/22/2019   Procedure: LUMBAR PERCUTANEOUS PEDICLE SCREW TWO LEVEL;  Surgeon: Erline Levine, MD;  Location: [facility];  Service: Neurosurgery;  Laterality: N/A;   PLEURAL EFFUSION DRAINAGE Left 09/09/2017   Procedure: DRAINAGE OF PLEURAL EFFUSION;  Surgeon: [name], MD;  Location: [facility];  Service: Thoracic;  Laterality: Left;   POSTERIOR CERVICAL FUSION/FORAMINOTOMY N/A 06/24/2014   Procedure: Posterior Cervical Three-Seven Fusion with Lateral Mass Fixation;  Surgeon: Erline Levine, MD;  Location: [facility];  Service: Neurosurgery;  Laterality: N/A;  C3-C7 posterior cervical fusion with lateral mass fixation   SHOULDER SURGERY Bilateral    VIDEO ASSISTED THORACOSCOPY (VATS)/DECORTICATION Left 09/09/2017   Procedure: VIDEO ASSISTED THORACOSCOPY (VATS)/DECORTICATION;  Surgeon: [name], MD;  Location: [facility];  Service: Thoracic;  Laterality: Left;   VIDEO BRONCHOSCOPY  09/09/2017   Procedure: VIDEO BRONCHOSCOPY;  Surgeon: [name], MD;  Location: [facility];  Service: Thoracic;;    There were no vitals filed for this visit.   Subjective Assessment - 10/20/21 1107     Subjective Pt reports increasing upper and low back pain with attempts at [facility] and other HEP exercises. Reports he saw the pain management specialist this week who states that she could write another referral for the DN if it would be helpful.    Pertinent History Parkinson's disease, frequent falls, chronic LBP, history of multiple back/neck surgeries and fusions.    Patient Stated Goals "to get the 'hot spot' so I can stand & sit better"     Currently in Pain? Yes    Pain Score 7     Pain Location Back    Pain Orientation Upper    Pain Descriptors / Indicators Aching   "hot spot"   Pain Type Chronic pain    Pain Frequency Constant    Pain Score 4    Pain Location Back    Pain Orientation Lower    Pain Descriptors / Indicators Sharp;Jabbing    Pain Type Chronic pain    Pain Frequency Constant                [facility] PT Assessment - 10/20/21 1105       Assessment   Medical Diagnosis Parkinson's disease    Referring Provider (PT) Alonza Bogus, DO    Onset Date/Surgical Date 06/15/21    Next MD Visit 02/06/22      Ambulation/Gait   Assistive device None;Straight cane;Rollator    Gait Pattern Step-through pattern;Decreased stride length;Shuffle;Decreased hip/knee flexion - right;Decreased hip/knee flexion - left;Poor foot clearance - left;Poor foot clearance - right;Trunk flexed    Gait velocity 2.33 ft/sec w/o AD, 2.34 ft/sec with SPC, 2.80 ft/sec with rollator      Standardized Balance Assessment   10 Meter Walk 14.05 sec w/o AD, 14.03 sec with SPC, 11.72 sec with rollator      Berg Balance Test   Sit to Stand Able to stand without using hands and stabilize independently    Standing Unsupported Able to stand safely 2 minutes    Sitting with Back Unsupported but Feet Supported on Floor or Stool Able to sit safely and securely 2 minutes    Stand to Sit Sits safely with minimal use of hands    Transfers Able to transfer safely, minor use of hands    Standing Unsupported with Eyes Closed Able to stand 10 seconds safely    Standing Unsupported with Feet Together Able to place feet together independently and stand 1 minute safely   2-3 inches btw feet   From Standing, Reach Forward with Outstretched Arm Can reach forward >12 cm safely (5")    From Standing Position, Pick up Object from Floor Able to pick up shoe, needs supervision    From Standing Position, Turn to Look Behind Over each Shoulder Needs assist to keep  from losing balance and falling   unable d/t h/o back fusions   Turn 360 Degrees Able to turn 360 degrees safely but slowly    Standing Unsupported, Alternately Place Feet on Step/Stool Able to stand independently and complete 8 steps >20 seconds    Standing Unsupported, One Foot in Front Able to take small step independently and hold 30  seconds    Standing on One Leg Tries to lift leg/unable to hold 3 seconds but remains standing independently    Total Score 42    Berg comment: 37-45 significant fall risk (>80%)      Timed Up and Go Test   Normal TUG (seconds) 14.44                           [facility] Adult PT Treatment/Exercise - 10/20/21 1105       Manual Therapy   Manual Therapy Soft tissue mobilization;Myofascial release    Soft tissue mobilization STM/DTM to L thoracic paraspinals, rhomboids, middle/lower traps &  subscapularis    Myofascial Release manual TPR to L lower thoracic paraspinals, rhomboids, middle/lower traps & subscapularis                       PT Short Term Goals - 10/20/21 1119       PT SHORT TERM GOAL #1   Title Patient will be independent with initial HEP    Status Achieved   10/20/21   Target Date 10/13/21               PT Long Term Goals - 10/20/21 1138       PT LONG TERM GOAL #1   Title Patient will be independent with ongoing/advanced HEP for self-management at home    Status On-going    Target Date 11/03/21      PT LONG TERM GOAL #2   Title Patient will demonstrate improved B LE strength to >/= 4/5 for improved stability and ease of mobility    Status On-going    Target Date 11/03/21      PT LONG TERM GOAL #3   Title Patient will ambulate with improved upright posture, increased stride length, improved foot clearance and increased gait speed to >/= 2.62 ft/sec with or w/o SPC and >/= 3.0 ft/sec with rollator to increase safety with community ambulation    Baseline 2.22 ft/sec w/o AD, 2.18 ft/sec with SPC, 2.73  ft/sec with rollator (09/22/21) ; 2.33 ft/sec w/o AD, 2.34 ft/sec with SPC, 2.80 ft/sec with rollator (10/20/21)    Status On-going   10/20/21 - Gait speed improving with improved foot clearance and stride length evident when using SPC or rollator but not consistently with [facility]   Target Date 11/03/21      PT LONG TERM GOAL #4   Title Patient will improve 5x STS time to </= 20 seconds for improved efficiency and safety with transfers    Baseline 5xSTS = 26.65 sec    Status On-going    Target Date 11/03/21      PT LONG TERM GOAL #5   Title Patient will demonstrate decreased TUG time to </= 14 sec to decrease risk for falls with transitional mobility    Baseline TUG = 17.9 sec (09/22/21); 14.44 sec (10/20/21)    Status Partially Met   10/20/21 - TUG time reduced by 3.5 sec   Target Date 11/03/21      PT LONG TERM GOAL #6   Title Patient will improve Berg score to >/= 45/56 to improve safety and stability with ADLs in standing and reduce risk for falls    Baseline Berg = 36/56 (09/22/21); 42/56 (10/20/21)    Status Partially Met   10/20/21 - Berg improved by 6 points   Target Date 11/03/21  Plan - 10/20/21 1151     Clinical Impression Statement Aaron Schmidt has demonstrated progress towards his goals with balance related standardized testing reveals improvements across all tests with the exception of 5xSTS deferred due pt c/o of increased upper and lower back pain. He continues to report increased upper and lower back pain limiting several of attempted activities during PT sessions and keeping him from completing recommended exercises at home as frequently as intended. He admits to having stopped working on the previously issued HEP to address his back pain issues, therefore recommended he take a break from new exercises and resume back HEP to see if this will reduce his back pain and improve his activity tolerance for new activities/exercises.    Comorbidities Parkinson's,  arthritis, chronic pain, multiple back surgeries/fusions, CKD, falls    Rehab Potential Fair    PT Frequency 2x / week    PT Duration 6 weeks    PT Treatment/Interventions ADLs/Self Care Home Management;Cryotherapy;Electrical Stimulation;Iontophoresis 30m/ml Dexamethasone;Moist Heat;Ultrasound;DME Instruction;Gait training;Stair training;Functional mobility training;Therapeutic activities;Therapeutic exercise;Balance training;Neuromuscular re-education;Patient/family education;Manual techniques;Passive range of motion;Dry needling;Energy conservation;Taping    PT Next Visit Plan Review and update back related HEP from prior PT episode; review [facility]; trial of gait with hiking poles to promote improved upright posture and reciprocal movement; gently progress LE/core flexibility and strengthening, incorporating balance activities +/- PWR! Moves as tolerated    PT Home Exercise Plan Access Code: [email or web address](1/9);  OHarrahprogram (1/19)    Consulted and Agree with Plan of Care Patient             Patient will benefit from skilled therapeutic intervention in order to improve the following deficits and impairments:  Abnormal gait, Cardiopulmonary status limiting activity, Decreased activity tolerance, Decreased balance, Decreased coordination, Decreased endurance, Decreased knowledge of use of DME, Decreased mobility, Decreased range of motion, Decreased strength, Difficulty walking, Hypomobility, Increased fascial restricitons, Increased muscle spasms, Impaired perceived functional ability, Impaired flexibility, Improper body mechanics, Postural dysfunction, Pain  Visit Diagnosis: Other abnormalities of gait and mobility  Abnormal posture  Unsteadiness on feet  Muscle weakness (generalized)     Problem List Patient Active Problem List   Diagnosis Date Noted   Mitral regurgitation 01/12/2021   Anemia due to chronic blood loss 01/10/2021   Benign prostatic hyperplasia 01/10/2021    Benign prostatic hyperplasia with lower urinary tract symptoms 01/10/2021   Chronic kidney disease, stage 3 unspecified ([place] 01/10/2021   Peripheral edema 01/10/2021   Enlarged prostate 01/10/2021   Hardening of the aorta (main artery of the heart) ([place] 01/10/2021   Hypoalbuminemia 01/10/2021   Impaired fasting glucose 01/10/2021   Iron deficiency anemia 01/10/2021   Kidney stone 01/10/2021   Mitral valve disorder 01/10/2021   Osteopenia 01/10/2021   Other specified disorders of kidney and ureter 01/10/2021   Peripheral neuralgia 01/10/2021   Sciatica 01/10/2021   Personal history of colonic polyps 01/10/2021   Personal history of other diseases of the respiratory system 01/10/2021   Pure hypercholesterolemia 01/10/2021   Thrombocytopenia ([place] 01/10/2021   Tubular adenoma of colon 01/10/2021   Unspecified abnormal finding in specimens from other organs, systems and tissues 01/10/2021   Edema 01/10/2021   Old myocardial infarction 01/10/2021   Osteoarthrosis 01/10/2021   Intermittent tremor 01/10/2021   Arthritis    History of kidney stones    Hypertension    Parkinson's disease ([place]    Occasional tremors 07/26/2020   Chronic bilateral low back pain 07/26/2020   Sensorineural hearing loss (SNHL)  of both ears 06/15/2020   Muscle pain 03/01/2020   Degenerative lumbar spinal stenosis 09/22/2019   Body mass index (BMI) 25.0-25.9, adult 08/31/2019   Scoliosis concern 08/31/2019   Lumbar stenosis with neurogenic claudication 08/31/2019   Spinal stenosis, lumbar region without neurogenic claudication 08/19/2019   Hypotension 06/23/2019   Myocarditis ([place]) 05/19/2019   CAD (coronary artery disease) 04/20/2019   Obesity 04/06/2019   Malnutrition of moderate degree 03/23/2019   NSTEMI (non-ST elevated myocardial infarction) ([place]) 03/21/2019   Myocardial infarction ([place]) 03/21/2019   Anemia 09/21/2017   Symptomatic anemia 09/20/2017   Protein-calorie malnutrition, severe  09/11/2017   Empyema lung ([place]) 09/09/2017   SIRS (systemic inflammatory response syndrome) ([place]) 09/05/2017   Elevated troponin 09/05/2017   Normochromic normocytic anemia 09/05/2017   Hyponatremia 09/05/2017   Acute kidney injury superimposed on chronic kidney disease ([place]) 09/05/2017   Chronic pain 09/05/2017   Pleural effusion, left 09/04/2017   Pleural effusion on left 08/19/2017   Sinusitis, chronic 08/19/2017   Fall in home 07/10/2017   Murmur 01/08/2017   Skin ulcer of toe of left foot, limited to breakdown of skin ([place]) 09/04/2016   Displacement of cervical intervertebral disc 07/19/2014   Cervical myelopathy with cervical radiculopathy ([place]) 06/24/2014   LBBB (left bundle branch block) 06/21/2014   Hyperlipidemia 06/21/2014   Preoperative cardiovascular examination 06/21/2014   Degenerative cervical spinal stenosis 05/26/2014   Cervical spondylosis without myelopathy 05/12/2014   Neck pain 03/03/2014   Shoulder joint pain 12/01/2013   Spinal stenosis of lumbar region 08/10/2013   Chronic low back pain 07/29/2013   Lumbar radiculopathy 07/29/2013   Lumbar post-laminectomy syndrome 07/15/2013   Obstructive sleep apnea 09/30/2010   Sleep apnea 2012   Essential hypertension 08/29/2010    Aaron Schmidt, PT 10/20/2021, 12:49 PM  [facility] [address]  [place], [place], [address] Phone: [phone]   Fax:  [phone]  Name: Aaron Schmidt MRN: [id number] Date of Birth: May 11, 1933

## 2021-10-23 ENCOUNTER — Ambulatory Visit: Payer: Medicare Other | Admitting: Physical Therapy

## 2021-10-23 ENCOUNTER — Other Ambulatory Visit: Payer: Self-pay

## 2021-10-23 DIAGNOSIS — R293 Abnormal posture: Secondary | ICD-10-CM

## 2021-10-23 DIAGNOSIS — R2689 Other abnormalities of gait and mobility: Secondary | ICD-10-CM

## 2021-10-23 DIAGNOSIS — M6281 Muscle weakness (generalized): Secondary | ICD-10-CM

## 2021-10-23 DIAGNOSIS — R2681 Unsteadiness on feet: Secondary | ICD-10-CM

## 2021-10-23 NOTE — Therapy (Signed)
[facility] [address]  [place], [place], [address] Phone: [phone]   Fax:  [phone]  Physical Therapy Treatment  Patient Details  Name: Aaron Schmidt MRN: [id number] Date of Birth: July 21, 1933 Referring Provider (PT): Alonza Bogus, DO   Encounter Date: 10/23/2021   PT End of Session - 10/23/21 1102     Visit Number 7    Number of Visits 12    Date for PT Re-Evaluation 11/03/21    Authorization Type [employer] Medicare    PT Start Time 6073    PT Stop Time 7106    PT Time Calculation (min) 42 min    Activity Tolerance Patient tolerated treatment well    Behavior During Therapy [facility] for tasks assessed/performed             Past Medical History:  Diagnosis Date   Acute kidney injury superimposed on chronic kidney disease ([place]) 09/05/2017   Anemia 09/21/2017   Anemia due to chronic blood loss 01/10/2021   Arthritis    Degeneration spine & stenosis   Benign prostatic hyperplasia 01/10/2021   Benign prostatic hyperplasia with lower urinary tract symptoms 01/10/2021   Body mass index (BMI) 25.0-25.9, adult 08/31/2019   CAD (coronary artery disease) 04/20/2019   Cervical myelopathy with cervical radiculopathy ([place]) 06/24/2014   Cervical spondylosis without myelopathy 05/12/2014   Chronic bilateral low back pain 07/26/2020   Chronic kidney disease, stage 3 unspecified ([place]) 01/10/2021   Chronic low back pain 07/29/2013   Chronic pain 09/05/2017   Degenerative cervical spinal stenosis 05/26/2014   Degenerative lumbar spinal stenosis 09/22/2019   Displacement of cervical intervertebral disc 07/19/2014   Edema 01/10/2021   Elevated troponin 09/05/2017   Empyema lung ([place]) 09/09/2017   Enlarged prostate 01/10/2021   Essential hypertension 08/29/2010   Qualifier: Diagnosis of  By: Elsworth Soho MD, Leanna Sato     Fall in home 07/10/2017   Hardening of the aorta (main artery of the heart) ([place]) 01/10/2021   History of kidney stones     Hyperlipidemia 06/21/2014   Hypertension    has a histroy of   Hypoalbuminemia 01/10/2021   Hyponatremia 09/05/2017   Hypotension    Impaired fasting glucose 01/10/2021   Intermittent tremor 01/10/2021   Iron deficiency anemia 01/10/2021   Kidney stone 01/10/2021   LBBB (left bundle branch block) 06/21/2014   Lumbar post-laminectomy syndrome 07/15/2013   Lumbar radiculopathy 07/29/2013   Lumbar stenosis with neurogenic claudication 08/31/2019   Malnutrition of moderate degree 03/23/2019   Mitral valve disorder 01/10/2021   Murmur 01/08/2017   Muscle pain 03/01/2020   Myocardial infarction ([place]) 03/21/2019   Neck pain 03/03/2014   Normochromic normocytic anemia 09/05/2017   NSTEMI (non-ST elevated myocardial infarction) ([place]) 03/21/2019   Obesity 04/06/2019   Obstructive sleep apnea 09/30/2010   PSG (180 lbs ) >> severe obstructive sleep apnea with AHi 38/h & desatns to 82%  Started on auto 5-15  >> changed to 8 cm Download reviewed 1/21 - 11/23/10 >> residual events of 6/h , avg pr 8 cm, leak + with nasal pillows     Occasional tremors 07/26/2020   Old myocardial infarction 01/10/2021   Osteoarthrosis 01/10/2021   Osteopenia 01/10/2021   Other specified disorders of kidney and ureter 01/10/2021   Parkinson's disease ([place])    Peripheral edema 01/10/2021   Peripheral neuralgia 01/10/2021   Personal history of colonic polyps 01/10/2021   Personal history of other diseases of the respiratory  system 01/10/2021   Pleural effusion on left 08/19/2017   Acute symptoms since 08/13/17 > CTa  08/13/17  neg pe/ extensively loculated effusion - L thoracentesis 08/20/2017 :  220 cc with glucose < 20 and WBC 5,338 mostly Poly, LDH 688 > cyt neg >Referrd to T surgery > VATS 09/09/17 c/w empyema   Pleural effusion, left 09/04/2017   Preoperative cardiovascular examination 06/21/2014   Protein-calorie malnutrition, severe 09/11/2017   Pure hypercholesterolemia 01/10/2021   Sciatica 01/10/2021   Scoliosis concern 08/31/2019    Sensorineural hearing loss (SNHL) of both ears 06/15/2020   Last Assessment & Plan:  Formatting of this note might be different from the original. Concern over worsening hearing. Chronic history of sensorineural hearing loss bilaterally.  Has hearing aids currently.  He feels his hearing aids are not working as well as they historically have.  Recently had them checked. EXAM shows normal external canals and tympanic membranes bilaterally. AUDIOGRAM Shows B   Shoulder joint pain 12/01/2013   Sinusitis, chronic 08/19/2017   CT sinus  08/20/2017 >>> Normally aerated paranasal sinuses.  Patent sinus drainage pathways.   SIRS (systemic inflammatory response syndrome) ([place]) 09/05/2017   Skin ulcer of toe of left foot, limited to breakdown of skin ([place]) 09/04/2016   Sleep apnea 2012   used CPAP 2 yrs. ago, feels he sleeps better w/o, no longer using    Spinal stenosis of lumbar region 08/10/2013   Spinal stenosis, lumbar region without neurogenic claudication 08/19/2019   Symptomatic anemia 09/20/2017   Thrombocytopenia ([place]) 01/10/2021   Tubular adenoma of colon 01/10/2021   Unspecified abnormal finding in specimens from other organs, systems and tissues 01/10/2021    Past Surgical History:  Procedure Laterality Date   ANTERIOR LAT LUMBAR FUSION N/A 09/22/2019   Procedure: Lumbar one-two, Lumbar two-three Anterolateral lumbar interbody fusion with pedicle screw fixation and exploration of adjacent fusion;  Surgeon: Erline Levine, MD;  Location: [facility];  Service: Neurosurgery;  Laterality: N/A;   ARM NEUROPLASTY     at 14 yrs. of age- fell off tractor- had fracture & repair *& later- 1990's had  transplantation of a nerve at the elbow   BACK SURGERY     x4 back surgery x2 fusion -   CARDIAC CATHETERIZATION  03/23/2019   CARPAL TUNNEL RELEASE Right    COLONOSCOPY     ESOPHAGOGASTRODUODENOSCOPY (EGD) WITH PROPOFOL N/A 09/23/2017   Procedure: ESOPHAGOGASTRODUODENOSCOPY (EGD) WITH PROPOFOL;  Surgeon:  Laurence Spates, MD;  Location: [facility];  Service: Endoscopy;  Laterality: N/A;   LEFT HEART CATH AND CORONARY ANGIOGRAPHY N/A 03/23/2019   Procedure: LEFT HEART CATH AND CORONARY ANGIOGRAPHY;  Surgeon: Burnell Blanks, MD;  Location: [facility];  Service: Cardiovascular;  Laterality: N/A;   LUMBAR PERCUTANEOUS PEDICLE SCREW 2 LEVEL N/A 09/22/2019   Procedure: LUMBAR PERCUTANEOUS PEDICLE SCREW TWO LEVEL;  Surgeon: Erline Levine, MD;  Location: [facility];  Service: Neurosurgery;  Laterality: N/A;   PLEURAL EFFUSION DRAINAGE Left 09/09/2017   Procedure: DRAINAGE OF PLEURAL EFFUSION;  Surgeon: [name], MD;  Location: [facility];  Service: Thoracic;  Laterality: Left;   POSTERIOR CERVICAL FUSION/FORAMINOTOMY N/A 06/24/2014   Procedure: Posterior Cervical Three-Seven Fusion with Lateral Mass Fixation;  Surgeon: Erline Levine, MD;  Location: [facility];  Service: Neurosurgery;  Laterality: N/A;  C3-C7 posterior cervical fusion with lateral mass fixation   SHOULDER SURGERY Bilateral    VIDEO ASSISTED THORACOSCOPY (VATS)/DECORTICATION Left 09/09/2017   Procedure: VIDEO ASSISTED THORACOSCOPY (VATS)/DECORTICATION;  Surgeon: [name], MD;  [facility];  Service: Thoracic;  Laterality: Left;   VIDEO BRONCHOSCOPY  09/09/2017   Procedure: VIDEO BRONCHOSCOPY;  Surgeon: [name], MD;  Location: [facility];  Service: Thoracic;;    There were no vitals filed for this visit.   Subjective Assessment - 10/23/21 1116     Subjective Pt reports he did not do any exercises ove the weekend other than the HS stretches, fwd lean stretch with his cane and seated LAQ due to ongoing back pain.    Pertinent History Parkinson's disease, frequent falls, chronic LBP, history of multiple back/neck surgeries and fusions.    Patient Stated Goals "to get the 'hot spot' so I can stand & sit better"    Currently in Pain? Yes    Pain Score 7     Pain Location Back   medial scapula    Pain Orientation Mid;Upper;Left    Pain Descriptors / Indicators Aching   "hot spot"   Pain Type Chronic pain    Pain Score 7    Pain Location Back    Pain Orientation Lower    Pain Descriptors / Indicators Sharp;Jabbing    Pain Type Chronic pain                               [facility] Adult PT Treatment/Exercise - 10/23/21 1102       Ambulation/Gait   Ambulation Distance (Feet) 540 Feet   6 laps around gym   Assistive device Other (Comment)   hiking poles - Bilateral x 4 laps, R only x 2 laps   Gait Pattern Step-through pattern;Decreased stride length;Decreased hip/knee flexion - right;Decreased hip/knee flexion - left;Poor foot clearance - left;Poor foot clearance - right;Trunk flexed      Lumbar Exercises: Stretches   Standing Side Bend Right;2 reps;30 seconds    Standing Side Bend Limitations seated left QL stretch    Quadruped Mid Back Stretch 2 reps;30 seconds   each position   Quadruped Mid Back Stretch Limitations seated 3-way prayer stretch with cane    Other Lumbar Stretch Exercise Seated R/L trunk rotation stretch using arm of chair x 30 sec    Other Lumbar Stretch Exercise B rhomboid/upper back stretch 3 x 30 sec      Shoulder Exercises: Seated   Extension Both;10 reps;Strengthening;Theraband    Theraband Level (Shoulder Extension) Level 1 ([place])    Extension Limitations cues for abdominal bracing and scap retraction into pool noodle along back of chair, with repeated cues to slow pace for better control and muscle activation    Retraction Both;10 reps;AROM;Strengthening    Retraction Limitations scap retraction & upper trunk extension into pool noodle along back of chair    Row Both;10 reps;Strengthening;Theraband    Theraband Level (Shoulder Row) Level 1 ([place]ellow)    Row Limitations cues for abdominal bracing and scap retraction into pool noodle along back of chair, with repeated cues to slow pace for better control and muscle activation      Manual  Therapy   Manual Therapy Soft tissue mobilization;Myofascial release;Scapular mobilization    Soft tissue mobilization STM/DTM to L thoracic & lumbar paraspinals, QL, rhomboids, middle/lower traps & subscapularis    Myofascial Release manual TPR to L lower thoracic paraspinals, rhomboids, middle/lower traps & subscapularis    Scapular Mobilization manual L subscpaular release; L medial/lateral scapular glide & rotation  PT Short Term Goals - 10/20/21 1119       PT SHORT TERM GOAL #1   Title Patient will be independent with initial HEP    Status Achieved   10/20/21   Target Date 10/13/21               PT Long Term Goals - 10/20/21 1138       PT LONG TERM GOAL #1   Title Patient will be independent with ongoing/advanced HEP for self-management at home    Status On-going    Target Date 11/03/21      PT LONG TERM GOAL #2   Title Patient will demonstrate improved B LE strength to >/= 4/5 for improved stability and ease of mobility    Status On-going    Target Date 11/03/21      PT LONG TERM GOAL #3   Title Patient will ambulate with improved upright posture, increased stride length, improved foot clearance and increased gait speed to >/= 2.62 ft/sec with or w/o SPC and >/= 3.0 ft/sec with rollator to increase safety with community ambulation    Baseline 2.22 ft/sec w/o AD, 2.18 ft/sec with SPC, 2.73 ft/sec with rollator (09/22/21) ; 2.33 ft/sec w/o AD, 2.34 ft/sec with SPC, 2.80 ft/sec with rollator (10/20/21)    Status On-going   10/20/21 - Gait speed improving with improved foot clearance and stride length evident when using SPC or rollator but not consistently with [facility]   Target Date 11/03/21      PT LONG TERM GOAL #4   Title Patient will improve 5x STS time to </= 20 seconds for improved efficiency and safety with transfers    Baseline 5xSTS = 26.65 sec    Status On-going    Target Date 11/03/21      PT LONG TERM GOAL #5   Title Patient  will demonstrate decreased TUG time to </= 14 sec to decrease risk for falls with transitional mobility    Baseline TUG = 17.9 sec (09/22/21); 14.44 sec (10/20/21)    Status Partially Met   10/20/21 - TUG time reduced by 3.5 sec   Target Date 11/03/21      PT LONG TERM GOAL #6   Title Patient will improve Berg score to >/= 45/56 to improve safety and stability with ADLs in standing and reduce risk for falls    Baseline Berg = 36/56 (09/22/21); 42/56 (10/20/21)    Status Partially Met   10/20/21 - Berg improved by 6 points   Target Date 11/03/21                   Plan - 10/23/21 1144     Clinical Impression Statement Contrell reports minimal exercise performance over the weekend (other than seated HS, 3-way low back stretch and seated LAQ) due to c/o increased back pain and did not attempt exercises prior back HEP as requested to attempt to reduce back pain interference with exercises and daily mobility. Pt reporting cardio machines also irritate his back, therefore opted to complete warm-up with walking, introducing trial of hiking/walking poles to promote more upright posture and reciprocal movement patterns. Pt c/o that he does not feel steady with 2 poles due to uncomfortable with sequencing, therefore decreased to single pole on R more reminiscent of his cane but still allowing for better upright posture. Reviewed prior back HEP including stretches and postural strengthening to help alleviate abnormal muscle tension and promote proper muscle activation for improved back support. Session concluded  with MT for STM/DTM/MFR to L sided periscapular and low back musculature with pt noting less pain by end of session. Encouraged pt to continue with back exercise program to help keep pain from escalating again.    Comorbidities Parkinson's, arthritis, chronic pain, multiple back surgeries/fusions, CKD, falls    Rehab Potential Fair    PT Frequency 2x / week    PT Duration 6 weeks    PT  Treatment/Interventions ADLs/Self Care Home Management;Cryotherapy;Electrical Stimulation;Iontophoresis 4mg /ml Dexamethasone;Moist Heat;Ultrasound;DME Instruction;Gait training;Stair training;Functional mobility training;Therapeutic activities;Therapeutic exercise;Balance training;Neuromuscular re-education;Patient/family education;Manual techniques;Passive range of motion;Dry needling;Energy conservation;Taping    PT Next Visit Plan review [place] program PRN; trial of gait with hiking poles to promote improved upright posture and reciprocal movement; gently progress LE/core flexibility and strengthening, incorporating balance activities +/- PWR! Moves as tolerated    PT Home Exercise Plan Access Code: [email or web address] (1/9);  [place] program (1/19)    Consulted and Agree with Plan of Care Patient             Patient will benefit from skilled therapeutic intervention in order to improve the following deficits and impairments:  Abnormal gait, Cardiopulmonary status limiting activity, Decreased activity tolerance, Decreased balance, Decreased coordination, Decreased endurance, Decreased knowledge of use of DME, Decreased mobility, Decreased range of motion, Decreased strength, Difficulty walking, Hypomobility, Increased fascial restricitons, Increased muscle spasms, Impaired perceived functional ability, Impaired flexibility, Improper body mechanics, Postural dysfunction, Pain  Visit Diagnosis: Other abnormalities of gait and mobility  Abnormal posture  Unsteadiness on feet  Muscle weakness (generalized)     Problem List Patient Active Problem List   Diagnosis Date Noted   Mitral regurgitation 01/12/2021   Anemia due to chronic blood loss 01/10/2021   Benign prostatic hyperplasia 01/10/2021   Benign prostatic hyperplasia with lower urinary tract symptoms 01/10/2021   Chronic kidney disease, stage 3 unspecified ([place]) 01/10/2021   Peripheral edema 01/10/2021   Enlarged prostate 01/10/2021    Hardening of the aorta (main artery of the heart) ([place]) 01/10/2021   Hypoalbuminemia 01/10/2021   Impaired fasting glucose 01/10/2021   Iron deficiency anemia 01/10/2021   Kidney stone 01/10/2021   Mitral valve disorder 01/10/2021   Osteopenia 01/10/2021   Other specified disorders of kidney and ureter 01/10/2021   Peripheral neuralgia 01/10/2021   Sciatica 01/10/2021   Personal history of colonic polyps 01/10/2021   Personal history of other diseases of the respiratory system 01/10/2021   Pure hypercholesterolemia 01/10/2021   Thrombocytopenia ([place]) 01/10/2021   Tubular adenoma of colon 01/10/2021   Unspecified abnormal finding in specimens from other organs, systems and tissues 01/10/2021   Edema 01/10/2021   Old myocardial infarction 01/10/2021   Osteoarthrosis 01/10/2021   Intermittent tremor 01/10/2021   Arthritis    History of kidney stones    Hypertension    Parkinson's disease ([place])    Occasional tremors 07/26/2020   Chronic bilateral low back pain 07/26/2020   Sensorineural hearing loss (SNHL) of both ears 06/15/2020   Muscle pain 03/01/2020   Degenerative lumbar spinal stenosis 09/22/2019   Body mass index (BMI) 25.0-25.9, adult 08/31/2019   Scoliosis concern 08/31/2019   Lumbar stenosis with neurogenic claudication 08/31/2019   Spinal stenosis, lumbar region without neurogenic claudication 08/19/2019   Hypotension 06/23/2019   Myocarditis ([place]) 05/19/2019   CAD (coronary artery disease) 04/20/2019   Obesity 04/06/2019   Malnutrition of moderate degree 03/23/2019   NSTEMI (non-ST elevated myocardial infarction) ([place]) 03/21/2019   Myocardial infarction ([place]) 03/21/2019   Anemia 09/21/2017  Symptomatic anemia 09/20/2017   Protein-calorie malnutrition, severe 09/11/2017   Empyema lung ([place]) 09/09/2017   SIRS (systemic inflammatory response syndrome) ([facility]) 09/05/2017   Elevated troponin 09/05/2017   Normochromic normocytic anemia 09/05/2017   Hyponatremia  09/05/2017   Acute kidney injury superimposed on chronic kidney disease ([place]) 09/05/2017   Chronic pain 09/05/2017   Pleural effusion, left 09/04/2017   Pleural effusion on left 08/19/2017   Sinusitis, chronic 08/19/2017   Fall in home 07/10/2017   Murmur 01/08/2017   Skin ulcer of toe of left foot, limited to breakdown of skin ([place]) 09/04/2016   Displacement of cervical intervertebral disc 07/19/2014   Cervical myelopathy with cervical radiculopathy ([facility]) 06/24/2014   LBBB (left bundle branch block) 06/21/2014   Hyperlipidemia 06/21/2014   Preoperative cardiovascular examination 06/21/2014   Degenerative cervical spinal stenosis 05/26/2014   Cervical spondylosis without myelopathy 05/12/2014   Neck pain 03/03/2014   Shoulder joint pain 12/01/2013   Spinal stenosis of lumbar region 08/10/2013   Chronic low back pain 07/29/2013   Lumbar radiculopathy 07/29/2013   Lumbar post-laminectomy syndrome 07/15/2013   Obstructive sleep apnea 09/30/2010   Sleep apnea 2012   Essential hypertension 08/29/2010    Percival Spanish, PT 10/23/2021, 12:06 PM  [facility] [address]  [place], [place], [address] Phone: [phone]   Fax:  [phone]  Name: SIDHANT HELDERMAN MRN: [id number] Date of Birth: January 14, 1933

## 2021-10-27 ENCOUNTER — Ambulatory Visit: Payer: Medicare Other | Attending: Neurology | Admitting: Physical Therapy

## 2021-10-27 ENCOUNTER — Other Ambulatory Visit: Payer: Self-pay

## 2021-10-27 ENCOUNTER — Encounter: Payer: Self-pay | Admitting: Physical Therapy

## 2021-10-27 DIAGNOSIS — R293 Abnormal posture: Secondary | ICD-10-CM

## 2021-10-27 DIAGNOSIS — R2689 Other abnormalities of gait and mobility: Secondary | ICD-10-CM

## 2021-10-27 DIAGNOSIS — M6281 Muscle weakness (generalized): Secondary | ICD-10-CM

## 2021-10-27 DIAGNOSIS — R2681 Unsteadiness on feet: Secondary | ICD-10-CM | POA: Diagnosis present

## 2021-10-27 NOTE — Therapy (Signed)
[facility] [address]  [place], [place], [address] Phone: [phone]   Fax:  [phone]  Physical Therapy Treatment / Discharge Summary  Patient Details  Name: Aaron Schmidt MRN: [id number] Date of Birth: August 08, 1933 Referring Provider (PT): Alonza Bogus, DO   Encounter Date: 10/27/2021   PT End of Session - 10/27/21 1102     Visit Number 8    Number of Visits 12    Date for PT Re-Evaluation 11/03/21    Authorization Type [employer] Medicare    PT Start Time 1102    PT Stop Time 1140    PT Time Calculation (min) 38 min    Activity Tolerance Patient limited by pain    Behavior During Therapy [facility] for tasks assessed/performed             Past Medical History:  Diagnosis Date   Acute kidney injury superimposed on chronic kidney disease ([place]) 09/05/2017   Anemia 09/21/2017   Anemia due to chronic blood loss 01/10/2021   Arthritis    Degeneration spine & stenosis   Benign prostatic hyperplasia 01/10/2021   Benign prostatic hyperplasia with lower urinary tract symptoms 01/10/2021   Body mass index (BMI) 25.0-25.9, adult 08/31/2019   CAD (coronary artery disease) 04/20/2019   Cervical myelopathy with cervical radiculopathy ([place]) 06/24/2014   Cervical spondylosis without myelopathy 05/12/2014   Chronic bilateral low back pain 07/26/2020   Chronic kidney disease, stage 3 unspecified ([place]) 01/10/2021   Chronic low back pain 07/29/2013   Chronic pain 09/05/2017   Degenerative cervical spinal stenosis 05/26/2014   Degenerative lumbar spinal stenosis 09/22/2019   Displacement of cervical intervertebral disc 07/19/2014   Edema 01/10/2021   Elevated troponin 09/05/2017   Empyema lung ([place]) 09/09/2017   Enlarged prostate 01/10/2021   Essential hypertension 08/29/2010   Qualifier: Diagnosis of  By: Elsworth Soho MD, Leanna Sato     Fall in home 07/10/2017   Hardening of the aorta (main artery of the heart) ([place]) 01/10/2021   History of kidney  stones    Hyperlipidemia 06/21/2014   Hypertension    has a histroy of   Hypoalbuminemia 01/10/2021   Hyponatremia 09/05/2017   Hypotension    Impaired fasting glucose 01/10/2021   Intermittent tremor 01/10/2021   Iron deficiency anemia 01/10/2021   Kidney stone 01/10/2021   LBBB (left bundle branch block) 06/21/2014   Lumbar post-laminectomy syndrome 07/15/2013   Lumbar radiculopathy 07/29/2013   Lumbar stenosis with neurogenic claudication 08/31/2019   Malnutrition of moderate degree 03/23/2019   Mitral valve disorder 01/10/2021   Murmur 01/08/2017   Muscle pain 03/01/2020   Myocardial infarction ([place]) 03/21/2019   Neck pain 03/03/2014   Normochromic normocytic anemia 09/05/2017   NSTEMI (non-ST elevated myocardial infarction) ([place]) 03/21/2019   Obesity 04/06/2019   Obstructive sleep apnea 09/30/2010   PSG (180 lbs ) >> severe obstructive sleep apnea with AHi 38/h & desatns to 82%  Started on auto 5-15  >> changed to 8 cm Download reviewed 1/21 - 11/23/10 >> residual events of 6/h , avg pr 8 cm, leak + with nasal pillows     Occasional tremors 07/26/2020   Old myocardial infarction 01/10/2021   Osteoarthrosis 01/10/2021   Osteopenia 01/10/2021   Other specified disorders of kidney and ureter 01/10/2021   Parkinson's disease ([place])    Peripheral edema 01/10/2021   Peripheral neuralgia 01/10/2021   Personal history of colonic polyps 01/10/2021   Personal history of other diseases  of the respiratory system 01/10/2021   Pleural effusion on left 08/19/2017   Acute symptoms since 08/13/17 > CTa  08/13/17  neg pe/ extensively loculated effusion - L thoracentesis 08/20/2017 :  220 cc with glucose < 20 and WBC 5,338 mostly Poly, LDH 688 > cyt neg >Referrd to T surgery > VATS 09/09/17 c/w empyema   Pleural effusion, left 09/04/2017   Preoperative cardiovascular examination 06/21/2014   Protein-calorie malnutrition, severe 09/11/2017   Pure hypercholesterolemia 01/10/2021   Sciatica 01/10/2021   Scoliosis concern  08/31/2019   Sensorineural hearing loss (SNHL) of both ears 06/15/2020   Last Assessment & Plan:  Formatting of this note might be different from the original. Concern over worsening hearing. Chronic history of sensorineural hearing loss bilaterally.  Has hearing aids currently.  He feels his hearing aids are not working as well as they historically have.  Recently had them checked. EXAM shows normal external canals and tympanic membranes bilaterally. AUDIOGRAM Shows B   Shoulder joint pain 12/01/2013   Sinusitis, chronic 08/19/2017   CT sinus  08/20/2017 >>> Normally aerated paranasal sinuses.  Patent sinus drainage pathways.   SIRS (systemic inflammatory response syndrome) ([place]) 09/05/2017   Skin ulcer of toe of left foot, limited to breakdown of skin ([place]) 09/04/2016   Sleep apnea 2012   used CPAP 2 yrs. ago, feels he sleeps better w/o, no longer using    Spinal stenosis of lumbar region 08/10/2013   Spinal stenosis, lumbar region without neurogenic claudication 08/19/2019   Symptomatic anemia 09/20/2017   Thrombocytopenia ([place]) 01/10/2021   Tubular adenoma of colon 01/10/2021   Unspecified abnormal finding in specimens from other organs, systems and tissues 01/10/2021    Past Surgical History:  Procedure Laterality Date   ANTERIOR LAT LUMBAR FUSION N/A 09/22/2019   Procedure: Lumbar one-two, Lumbar two-three Anterolateral lumbar interbody fusion with pedicle screw fixation and exploration of adjacent fusion;  Surgeon: Erline Levine, MD;  Location: [facility];  Service: Neurosurgery;  Laterality: N/A;   ARM NEUROPLASTY     at 14 yrs. of age- fell off tractor- had fracture & repair *& later- 1990's had  transplantation of a nerve at the elbow   BACK SURGERY     x4 back surgery x2 fusion -   CARDIAC CATHETERIZATION  03/23/2019   CARPAL TUNNEL RELEASE Right    COLONOSCOPY     ESOPHAGOGASTRODUODENOSCOPY (EGD) WITH PROPOFOL N/A 09/23/2017   Procedure: ESOPHAGOGASTRODUODENOSCOPY (EGD) WITH PROPOFOL;   Surgeon: Laurence Spates, MD;  Location: [facility];  Service: Endoscopy;  Laterality: N/A;   LEFT HEART CATH AND CORONARY ANGIOGRAPHY N/A 03/23/2019   Procedure: LEFT HEART CATH AND CORONARY ANGIOGRAPHY;  Surgeon: Burnell Blanks, MD;  Location: [facility];  Service: Cardiovascular;  Laterality: N/A;   LUMBAR PERCUTANEOUS PEDICLE SCREW 2 LEVEL N/A 09/22/2019   Procedure: LUMBAR PERCUTANEOUS PEDICLE SCREW TWO LEVEL;  Surgeon: Erline Levine, MD;  Location: [facility];  Service: Neurosurgery;  Laterality: N/A;   PLEURAL EFFUSION DRAINAGE Left 09/09/2017   Procedure: DRAINAGE OF PLEURAL EFFUSION;  Surgeon: [name], MD;  Location: [facility];  Service: Thoracic;  Laterality: Left;   POSTERIOR CERVICAL FUSION/FORAMINOTOMY N/A 06/24/2014   Procedure: Posterior Cervical Three-Seven Fusion with Lateral Mass Fixation;  Surgeon: Erline Levine, MD;  Location: [facility];  Service: Neurosurgery;  Laterality: N/A;  C3-C7 posterior cervical fusion with lateral mass fixation   SHOULDER SURGERY Bilateral    VIDEO ASSISTED THORACOSCOPY (VATS)/DECORTICATION Left 09/09/2017   Procedure: VIDEO ASSISTED  THORACOSCOPY (VATS)/DECORTICATION;  Surgeon: [name], MD;  Location: [facility];  Service: Thoracic;  Laterality: Left;   VIDEO BRONCHOSCOPY  09/09/2017   Procedure: VIDEO BRONCHOSCOPY;  Surgeon: [name], MD;  Location: [facility];  Service: Thoracic;;    There were no vitals filed for this visit.   Subjective Assessment - 10/27/21 1106     Subjective Pt reports the "hot spot" in his upper back is better today but the low back remains very painful.    Pertinent History Parkinson's disease, frequent falls, chronic LBP, history of multiple back/neck surgeries and fusions.    Currently in Pain? Yes    Pain Score 3     Pain Location Back   medial scapula   Pain Orientation Mid;Upper;Left    Pain Descriptors / Indicators Discomfort    Pain Type Chronic pain    Pain Score 8    8.5/10   Pain Location Back    Pain Orientation Lower    Pain Descriptors / Indicators Sharp;Jabbing    Pain Type Chronic pain                [facility] PT Assessment - 10/27/21 1102       Assessment   Medical Diagnosis Parkinson's disease    Referring Provider (PT) Wells Guiles Tat, DO    Onset Date/Surgical Date --   diagnosis ~1 yr ago   Next MD Visit 02/06/22      Strength   Overall Strength Comments tested in sitting - limited effort with some motions d/t LBP    Right Hip Flexion 4-/5    Right Hip Extension 4-/5    Right Hip ABduction 4/5    Right Hip ADduction 4/5    Left Hip Flexion 4-/5    Left Hip Extension 4-/5    Left Hip ABduction 4/5    Left Hip ADduction 4/5    Right Knee Flexion 4-/5    Right Knee Extension 4/5    Left Knee Flexion 4-/5    Left Knee Extension 4/5    Right Ankle Dorsiflexion 3+/5    Left Ankle Dorsiflexion 3+/5      Ambulation/Gait   Assistive device Straight cane    Gait Pattern Step-through pattern;Decreased stride length;Decreased hip/knee flexion - right;Decreased hip/knee flexion - left;Poor foot clearance - left;Poor foot clearance - right;Trunk flexed    Gait velocity 2.33 ft/sec w/o AD, 2.34 ft/sec with SPC, 2.80 ft/sec with rollator      Standardized Balance Assessment   Standardized Balance Assessment --   all standardized balance assessment testing as of 10/20/21 - pt refused testing today d/t increased LBP and he feels that motions necessary for testing make the pain worse   10 Meter Walk 14.05 sec w/o AD, 14.03 sec with SPC, 11.72 sec with rollator      Berg Balance Test   Sit to Stand Able to stand without using hands and stabilize independently    Standing Unsupported Able to stand safely 2 minutes    Sitting with Back Unsupported but Feet Supported on Floor or Stool Able to sit safely and securely 2 minutes    Stand to Sit Sits safely with minimal use of hands    Transfers Able to transfer safely, minor use of hands    Standing  Unsupported with Eyes Closed Able to stand 10 seconds safely    Standing Unsupported with Feet Together Able to place feet together independently and stand 1 minute safely   2-3 inches btw feet  From Standing, Reach Forward with Outstretched Arm Can reach forward >12 cm safely (5")    From Standing Position, Pick up Object from [place] to pick up shoe, needs supervision    From Standing Position, Turn to Look Behind Over each Shoulder Needs assist to keep from losing balance and falling   unable d/t h/o back fusions   Turn 360 Degrees Able to turn 360 degrees safely but slowly    Standing Unsupported, Alternately Place Feet on Step/Stool Able to stand independently and complete 8 steps >20 seconds    Standing Unsupported, One Foot in [place] to take small step independently and hold 30 seconds    Standing on One Leg Tries to lift leg/unable to hold 3 seconds but remains standing independently    Total Score 42    Berg comment: 37-45 significant fall risk (>80%)      Timed Up and Go Test   Normal TUG (seconds) 14.44                           [facility] Adult PT Treatment/Exercise - 10/27/21 1102       Ambulation/Gait   Ambulation Distance (Feet) 270 Feet      Lumbar Exercises: Stretches   Passive Hamstring Stretch Right;Left;3 reps;30 seconds    Passive Hamstring Stretch Limitations seated hip hinge    Quadruped Mid Back Stretch 3 reps;30 seconds   each position   Quadruped Mid Back Stretch Limitations seated 3-way prayer stretch with cane      Lumbar Exercises: Seated   Long Arc Quad on Chair Both;10 reps;Strengthening    LAQ on Chair Weights (lbs) cues for TrA activation    Hip Flexion on Ball Both;10 reps;Strengthening    Hip Flexion on Ball Limitations cues for TrA activation   seated on mat table   Other Seated Lumbar Exercises TrA isometric 10 x 5"      Shoulder Exercises: Seated   Extension Both;10 reps;Strengthening;Theraband    Theraband Level (Shoulder  Extension) Level 1 ([place])    Extension Limitations cues for abdominal bracing and scap retraction into pool noodle along back of chair, with repeated cues to slow pace for better control and muscle activation    Retraction Both;10 reps;AROM;Strengthening    Retraction Limitations scap retraction & upper trunk extension into pool noodle along back of chair    Row Both;10 reps;Strengthening;Theraband    Theraband Level (Shoulder Row) Level 1 ([place])    Row Limitations cues for abdominal bracing and scap retraction into pool noodle along back of chair, with repeated cues to slow pace for better control and muscle activation                       PT Short Term Goals - 10/20/21 1119       PT SHORT TERM GOAL #1   Title Patient will be independent with initial HEP    Status Achieved   10/20/21   Target Date 10/13/21               PT Long Term Goals - 10/27/21 1120       PT LONG TERM GOAL #1   Title Patient will be independent with ongoing/advanced HEP for self-management at home    Status Partially Met   10/27/21 - met for exercises focused on managing back pain but unable to tolerate mobility focused exercises   Target Date 11/03/21  PT LONG TERM GOAL #2   Title Patient will demonstrate improved B LE strength to >/= 4/5 for improved stability and ease of mobility    Status Partially Met    Target Date 11/03/21      PT LONG TERM GOAL #3   Title Patient will ambulate with improved upright posture, increased stride length, improved foot clearance and increased gait speed to >/= 2.62 ft/sec with or w/o SPC and >/= 3.0 ft/sec with rollator to increase safety with community ambulation    Baseline 2.22 ft/sec w/o AD, 2.18 ft/sec with SPC, 2.73 ft/sec with rollator (09/22/21) ; 2.33 ft/sec w/o AD, 2.34 ft/sec with SPC, 2.80 ft/sec with rollator (10/20/21)    Status On-going   10/20/21 - Gait speed improving with improved foot clearance and stride length evident when using  SPC or rollator but not consistently with SPC; 10/27/21 - Pt refusing further testing/assessment   Target Date 11/03/21      PT LONG TERM GOAL #4   Title Patient will improve 5x STS time to </= 20 seconds for improved efficiency and safety with transfers    Baseline 5xSTS = 26.65 sec    Status Not Met   10/27/21 - Pt refusing further testing/assessment d/t c/o increased LBP with STS transitions   Target Date 11/03/21      PT LONG TERM GOAL #5   Title Patient will demonstrate decreased TUG time to </= 14 sec to decrease risk for falls with transitional mobility    Baseline TUG = 17.9 sec (09/22/21); 14.44 sec (10/20/21)    Status Partially Met   10/20/21 - TUG time reduced by 3.5 sec;  10/27/21 - Pt refusing further testing/assessment   Target Date 11/03/21      PT LONG TERM GOAL #6   Title Patient will improve Berg score to >/= 45/56 to improve safety and stability with ADLs in standing and reduce risk for falls    Baseline Berg = 36/56 (09/22/21); 42/56 (10/20/21)    Status Partially Met   10/20/21 - Berg improved by 6 points;  10/27/21 - Pt refusing further testing/assessment   Target Date 11/03/21                   Plan - 10/27/21 1140     Clinical Impression Statement Will reports improvement in upper back pain near scapula following last visit but continues to report increased LBP which prevents him from doing most exercises at home and limits tolerance for therapeutic activities during therapy sessions. Attempted more core/lumbopelvic focused exercises today but limited as pt unwilling to try supine exercises, limited ability to perform exercises in sitting, and states standing exercise make the pain worse. He declines any thermal or estim modalities stating no benefit from these in the past. Dirk wishing to discontinue PT at this point as he feels that it only makes his back pain worse. Highlighted best exercises to continue with HEP that seem to be better tolerated and kept his  upper back pain better controlled w/o increasing his LBP. Standardized balance testing for goal assessment deferred today as pt reports the movements required only make his LBP worse, but testing as of 10/20/21 did demonstrate progress on most standardized tests so some goals partially met.    Comorbidities Parkinson's, arthritis, chronic pain, multiple back surgeries/fusions, CKD, falls    PT Treatment/Interventions ADLs/Self Care Home Management;Cryotherapy;Electrical Stimulation;Iontophoresis 3m/ml Dexamethasone;Moist Heat;Ultrasound;DME Instruction;Gait training;Stair training;Functional mobility training;Therapeutic activities;Therapeutic exercise;Balance training;Neuromuscular re-education;Patient/family education;Manual techniques;Passive range of motion;Dry needling;Energy conservation;Taping  PT Next Visit Plan Discharge from PT    PT Home Exercise Plan Access Code: [email or web address] (1/9);  [place] program (1/19)    Consulted and Agree with Plan of Care Patient             Patient will benefit from skilled therapeutic intervention in order to improve the following deficits and impairments:  Abnormal gait, Cardiopulmonary status limiting activity, Decreased activity tolerance, Decreased balance, Decreased coordination, Decreased endurance, Decreased knowledge of use of DME, Decreased mobility, Decreased range of motion, Decreased strength, Difficulty walking, Hypomobility, Increased fascial restricitons, Increased muscle spasms, Impaired perceived functional ability, Impaired flexibility, Improper body mechanics, Postural dysfunction, Pain  Visit Diagnosis: Other abnormalities of gait and mobility  Abnormal posture  Unsteadiness on feet  Muscle weakness (generalized)     Problem List Patient Active Problem List   Diagnosis Date Noted   Mitral regurgitation 01/12/2021   Anemia due to chronic blood loss 01/10/2021   Benign prostatic hyperplasia 01/10/2021   Benign prostatic hyperplasia  with lower urinary tract symptoms 01/10/2021   Chronic kidney disease, stage 3 unspecified ([place]) 01/10/2021   Peripheral edema 01/10/2021   Enlarged prostate 01/10/2021   Hardening of the aorta (main artery of the heart) ([place]) 01/10/2021   Hypoalbuminemia 01/10/2021   Impaired fasting glucose 01/10/2021   Iron deficiency anemia 01/10/2021   Kidney stone 01/10/2021   Mitral valve disorder 01/10/2021   Osteopenia 01/10/2021   Other specified disorders of kidney and ureter 01/10/2021   Peripheral neuralgia 01/10/2021   Sciatica 01/10/2021   Personal history of colonic polyps 01/10/2021   Personal history of other diseases of the respiratory system 01/10/2021   Pure hypercholesterolemia 01/10/2021   Thrombocytopenia ([place]) 01/10/2021   Tubular adenoma of colon 01/10/2021   Unspecified abnormal finding in specimens from other organs, systems and tissues 01/10/2021   Edema 01/10/2021   Old myocardial infarction 01/10/2021   Osteoarthrosis 01/10/2021   Intermittent tremor 01/10/2021   Arthritis    History of kidney stones    Hypertension    Parkinson's disease ([place])    Occasional tremors 07/26/2020   Chronic bilateral low back pain 07/26/2020   Sensorineural hearing loss (SNHL) of both ears 06/15/2020   Muscle pain 03/01/2020   Degenerative lumbar spinal stenosis 09/22/2019   Body mass index (BMI) 25.0-25.9, adult 08/31/2019   Scoliosis concern 08/31/2019   Lumbar stenosis with neurogenic claudication 08/31/2019   Spinal stenosis, lumbar region without neurogenic claudication 08/19/2019   Hypotension 06/23/2019   Myocarditis ([place]) 05/19/2019   CAD (coronary artery disease) 04/20/2019   Obesity 04/06/2019   Malnutrition of moderate degree 03/23/2019   NSTEMI (non-ST elevated myocardial infarction) ([place]) 03/21/2019   Myocardial infarction ([place]) 03/21/2019   Anemia 09/21/2017   Symptomatic anemia 09/20/2017   Protein-calorie malnutrition, severe 09/11/2017   Empyema lung ([place])  09/09/2017   SIRS (systemic inflammatory response syndrome) ([place]) 09/05/2017   Elevated troponin 09/05/2017   Normochromic normocytic anemia 09/05/2017   Hyponatremia 09/05/2017   Acute kidney injury superimposed on chronic kidney disease ([place]) 09/05/2017   Chronic pain 09/05/2017   Pleural effusion, left 09/04/2017   Pleural effusion on left 08/19/2017   Sinusitis, chronic 08/19/2017   Fall in home 07/10/2017   Murmur 01/08/2017   Skin ulcer of toe of left foot, limited to breakdown of skin ([place]) 09/04/2016   Displacement of cervical intervertebral disc 07/19/2014   Cervical myelopathy with cervical radiculopathy ([place]) 06/24/2014   LBBB (left bundle branch block) 06/21/2014   Hyperlipidemia 06/21/2014  Preoperative cardiovascular examination 06/21/2014   Degenerative cervical spinal stenosis 05/26/2014   Cervical spondylosis without myelopathy 05/12/2014   Neck pain 03/03/2014   Shoulder joint pain 12/01/2013   Spinal stenosis of lumbar region 08/10/2013   Chronic low back pain 07/29/2013   Lumbar radiculopathy 07/29/2013   Lumbar post-laminectomy syndrome 07/15/2013   Obstructive sleep apnea 09/30/2010   Sleep apnea 2012   Essential hypertension 08/29/2010     PHYSICAL THERAPY DISCHARGE SUMMARY  Visits from Start of Care: 8  Current functional level related to goals / functional outcomes:   Refer to above clinical impression and goal assessment.   Remaining deficits:   LBP limiting functional mobility and activity tolerance. Pt reporting no benefit from exercise attempts to address LBP.   Education / Equipment:   HEP; [place] fall prevention program   Patient agrees to discharge. Patient goals were partially met. Patient is being discharged due to the patient's request.   Percival Spanish, PT 10/27/2021, 12:04 PM  [facility] [address]  [place], [place], [address] Phone: [phone]   Fax:   [phone]  Name: FERGUS THRONE MRN: [id number] Date of Birth: Feb 17, 1933

## 2021-11-01 ENCOUNTER — Ambulatory Visit: Payer: Medicare Other | Admitting: Physical Therapy

## 2021-12-27 ENCOUNTER — Other Ambulatory Visit: Payer: Self-pay | Admitting: Cardiology

## 2022-02-02 NOTE — Progress Notes (Signed)
? ? ?Assessment/Plan:  ? ?1.  Parkinsons Disease  ? -Continue carbidopa/levodopa 25/100, 2 tablet/2 tablet/1 tablet.  Looks better on increased med ? ? ? ?2.  Chronic low back pain ? -On chronic Dilaudid.  PDMP reviewed.  Remain concerned about the amount of Dilaudid, although it has slightly decreased, using about 135 tablets/month now (from 150).  This is still quite a bit in this age group and likely still contributing to daytime hypersomnolence and day/night reversal. ? ?3.  eds ? -Continue to suspect related to chronic narcotic therapy and the fact he sleeps all day and up all night (day/night reversal).   ? ?4.  Depression ? -related primarily to pain issues.   ? ?5.  Dizziness ? -May be related to lowering of blood pressure with Parkinson's.  On losartan.  Told him to talk with prescribing physician.  May also be related to the Dilaudid. ? ?Subjective:  ? ?Aaron Schmidt was seen today in follow up for Parkinsons disease.  Patient is with his wife who supplements the history.  He has had a couple falls since our last visit.  I sent a referral last visit for physical therapy for Parkinson's disease, but I got back a note from physical therapy that he was discharged because patient reported that it made his chronic back pain worse.  No falls.  No near syncope.  Still seeing pain management for chronic pain and on dilaudid.  Wife states some dizziness but its chronic, before increase in levodopa.  On losartan ? ?Current prescribed movement disorder medications: ?carbidopa/levodopa 25/100, 2 tablets/2 tablet / 1 tablet (increased from 1 tablet 3 times per day) ? ? ?PREVIOUS MEDICATIONS: Sinemet ? ?ALLERGIES:   ?Allergies  ?Allergen Reactions  ? Baclofen Other (See Comments)  ?  CONFUSION, BODY TREMORS  ? Doxazosin Mesylate Other (See Comments)  ?  Other reaction(s): low BP  ? Diazepam Other (See Comments)  ?  "makes goofy" ?Other reaction(s): confusion  ? ? ?CURRENT MEDICATIONS:  ?Outpatient Encounter  Medications as of 02/06/2022  ?Medication Sig  ? aspirin EC 81 MG tablet Take 81 mg by mouth daily.  ? carbidopa-levodopa (SINEMET IR) 25-100 MG tablet 2 tablets at 8am/2 tablets at noon/1 at 4pm  ? Cholecalciferol (VITAMIN D) 2000 UNITS CAPS Take 2,000 Units by mouth daily.  ? docusate sodium (COLACE) 100 MG capsule Take 600 mg by mouth daily.  ? finasteride (PROSCAR) 5 MG tablet Take 5 mg by mouth daily.  ? fluocinonide cream (LIDEX) 6.06 % Apply 1 application topically daily as needed (itching).  ? HYDROmorphone (DILAUDID) 2 MG tablet Take 2 tablets (4 mg total) by mouth 4 (four) times daily. (Patient taking differently: Take 4 mg by mouth See admin instructions. Take 1 tab every 5 hours as needed for pain do not exceed more than 5 per day)  ? L-Lysine 500 MG CAPS Take 500 mg by mouth daily.  ? losartan (COZAAR) 25 MG tablet Take 1 tablet by mouth once daily  ? mineral oil liquid Take 108 mLs by mouth daily in the afternoon.   ? Multiple Vitamin (MULTIVITAMIN WITH MINERALS) TABS tablet Take 1 tablet by mouth daily. Mature mens 50+/Unknown Strength  ? nortriptyline (PAMELOR) 25 MG capsule Take 25 mg by mouth at bedtime.   ? OVER THE COUNTER MEDICATION Take 3 tablets by mouth daily. Perdiem 15 mg  ? polyethylene glycol (MIRALAX / GLYCOLAX) packet Take 17 g by mouth daily.  ? Potassium Chloride ER 20 MEQ TBCR Take  20 mEq by mouth daily.  ? pyridOXINE (VITAMIN B-6) 100 MG tablet Take 100 mg by mouth daily.  ? tamsulosin (FLOMAX) 0.4 MG CAPS capsule Take 0.4 mg by mouth daily.  ? tiZANidine (ZANAFLEX) 2 MG tablet Take 4 mg by mouth 3 (three) times daily.  ? vitamin B-12 (CYANOCOBALAMIN) 1000 MCG tablet Take 1,000 mcg by mouth daily.   ? vitamin C (ASCORBIC ACID) 500 MG tablet Take 500 mg by mouth daily.  ? ?No facility-administered encounter medications on file as of 02/06/2022.  ? ? ?Objective:  ? ?PHYSICAL EXAMINATION:   ? ?VITALS:   ?Vitals:  ? 02/06/22 1119  ?BP: 115/72  ?Pulse: 70  ?SpO2: 97%  ?Weight: 151 lb 3.2  oz (68.6 kg)  ?Height: '5\' 10"'$  (1.778 m)  ? ? ? ? ?GEN:  The patient appears stated age and is in NAD. ?HEENT:  Normocephalic, atraumatic.  The mucous membranes are moist. The superficial temporal arteries are without ropiness or tenderness. ?CV:  RRR ?Lungs:  CTAB ?Neck/HEME:  There are no carotid bruits bilaterally. ? ?Neurological examination: ? ?Orientation: The patient is alert and oriented x3. ?Cranial nerves: There is good facial symmetry with facial hypomimia. The speech is fluent and clear. Soft palate rises symmetrically and there is no tongue deviation. Hearing is intact to conversational tone. ?Sensation: Sensation is intact to light touch throughout ?Motor: Strength is at least antigravity x4. ? ?Movement examination: ?Tone: There is normal tone in the UE/LE (marked improvement) ?Abnormal movements: none ?Coordination:  There is no decremation, with any form of RAMS, including alternating supination and pronation of the forearm, hand opening and closing, finger taps, heel taps and toe taps. ?Gait and Station: Patient pushes off of the chair to arise.  The patient's stride length is better.  He ambulates with cane ? ?I have reviewed and interpreted the following labs independently ? ?  Chemistry   ?   ?Component Value Date/Time  ? NA 140 09/23/2020 1920  ? NA 142 06/30/2019 0905  ? K 4.0 09/23/2020 1920  ? CL 105 09/23/2020 1920  ? CO2 28 09/23/2020 1920  ? BUN 18 09/23/2020 1920  ? BUN 23 06/30/2019 0905  ? CREATININE 0.87 09/23/2020 1920  ?    ?Component Value Date/Time  ? CALCIUM 9.3 09/23/2020 1920  ? ALKPHOS 69 07/21/2020 2119  ? AST 56 (H) 07/21/2020 2119  ? ALT 20 07/21/2020 2119  ? BILITOT 1.1 07/21/2020 2119  ?  ? ? ? ?Lab Results  ?Component Value Date  ? WBC 6.7 09/23/2020  ? HGB 11.4 (L) 09/23/2020  ? HCT 34.7 (L) 09/23/2020  ? MCV 96.9 09/23/2020  ? PLT 165 09/23/2020  ? ? ?Lab Results  ?Component Value Date  ? TSH 0.731 09/21/2017  ? ? ? ?Total time spent on today's visit was 21 minutes,  including both face-to-face time and nonface-to-face time.  Time included that spent on review of records (prior notes available to me/labs/imaging if pertinent), discussing treatment and goals, answering patient's questions and coordinating care. ? ?Cc:  Javier Glazier, MD ? ?

## 2022-02-06 ENCOUNTER — Encounter: Payer: Self-pay | Admitting: Neurology

## 2022-02-06 ENCOUNTER — Ambulatory Visit: Payer: Medicare Other | Admitting: Neurology

## 2022-02-06 VITALS — BP 115/72 | HR 70 | Ht 70.0 in | Wt 151.2 lb

## 2022-02-06 DIAGNOSIS — R42 Dizziness and giddiness: Secondary | ICD-10-CM

## 2022-02-06 DIAGNOSIS — G20 Parkinson's disease: Secondary | ICD-10-CM | POA: Diagnosis not present

## 2022-02-06 MED ORDER — CARBIDOPA-LEVODOPA 25-100 MG PO TABS: ORAL_TABLET | ORAL | 2 refills | Status: DC

## 2022-02-06 NOTE — Patient Instructions (Signed)
Local and Online Resources for Power over Parkinson's Group ?May 2023 ? ?LOCAL  PARKINSON'S GROUPS  ?Power over Parkinson's Group:   ?Power Over Parkinson's Patient Education Group will be Wednesday, May 10th-*Hybrid meting*- in person at [facility] location and via [facility] at 2:00 pm.   ?Upcoming Power over Parkinson's Meetings:  2nd Wednesdays of the month at 2 pm:  May 10th, June 14th, July 12th ?Contact Amy Marriott at [email or web address] if interested in participating in this group ?Parkinson's Care Partners Group:    3rd Mondays, Contact Misty Paladino ?Atypical Parkinsonian Patient Group:   4th Wednesdays, Contact Misty Paladino ?If you are interested in participating in these groups with Misty, please contact her directly for how to join those meetings.  Her contact information is [email or web address].   ? ?LOCAL EVENTS AND NEW OFFERINGS ?Moving Day [place]:  Saturday, May 6th, 9:30 am at [place], [place], [place]. Participate in Moving Day as a way to ?honor loved ones, raise funds, fight Parkinson's disease, and celebrate movement.?  Register today at [email or web address] ?[place]!  Play [place]!  Join [place] for home game for a fun evening to bring awareness of Parkinson's and raise funds for our Movement Disorder Funds. Rescheduled to May 11th  6:30 pm [place]. To purchase tickets:  [email or web address] ?Parkinson's T-shirts for sale!  Designed by a local group member, with funds going to [place].  $25.00  Contact Misty to purchase  ?New PWR! Moves [employer] Instructor-Led Class offering at [employer]!  Wednesdays 1-2 pm, starting April 12th.   Contact Bryson Dames, Acupuncturist at [employer].  [email or web address] [email or web address] ? ?ONLINE EDUCATION AND SUPPORT ?[place]:  www.[email or web address] ?PD Health at Home continues:  Mindfulness  Mondays, Wellness Wednesdays, Fitness Fridays  ?Upcoming Education:  ?Understanding Gene and Cell-Based Therapies in Parkinson's.  Wednesday, May 10th at 1:00 pm ?Additional Education offerings virtually through their website-upcoming topics include Palliative Care/Hospice and PD, Sleep and PD ?Register for expert briefings Cytogeneticist) at [email or web address] ?Please check out their website to sign up for emails and see their full online offerings ? ? ?[place]:  [email or web address]  ?Third Thursday Webinars:  On the third Thursday of every month at 12 p.m. ET, join our free live webinars to learn about various aspects of living with Parkinson's disease and our work to speed medical breakthroughs. ?Upcoming Webinar: Get Moving: Exercising for a Healthy Brain.  Thursday, May 18th  at  12 noon. ?Check out additional information on their website to see their full online offerings ? ?[place]:  [email or web address] ?Upcoming Webinar:   Stay tuned ?Webinar Series:  Living with Parkinson's Meetup.   Third Thursdays each month, 3 pm ?Care Partner Monthly Meetup.  With Robin Searing Phinney.  First Tuesday of each month, 2 pm ?Check out additional information to Live Well Today on their website ? ?Parkinson and Movement Disorders (PMD) Alliance:  www.pmdalliance.org ?[employer] Online:  Online Education Events ?Sign up for emails, which are sent weekly to give you updates on programming and online offerings ? ?Parkinson's Association of [place]:  www.[email or web address] ?Information on online support groups, education events, and online exercises including Yoga, Parkinson's exercises and more-LOTS of information on links to PD resources and online events ?Virtual Support Group through [employer] of the [place]; next one is scheduled for Wednesday, May 3rd at 2 pm. (These are typically  scheduled for the 1st Wednesday of the month at 2 pm).  Visit website for details. ?MOVEMENT AND  EXERCISE OPPORTUNITIES ?Parkinson's DRUMMING Classes/Music Therapy with Doylene Canning:  This is a returning class and it's FREE!  2nd Mondays, continuing May 8th, 11:00 at the [place].  Contact *Misty Taylor-Paladino at [email or web address] or Doylene Canning at [phone] or [email or web address]  ?PWR! Moves Classes at [place].  Wednesdays 10 and 11 am.   Contact Amy Marriott, PT [email or web address] if interested. ?NEW PWR! Moves Class offering at [employer].  Wednesdays 1-2 pm, starting April 12th.  Contact Bryson Dames, Acupuncturist at [employer].  [email or web address] ?Here is a link to the PWR!Moves classes on Zoom from [place] - Daily Mon-Sat at 10:00. Via Zoom, FREE and open to all.  There is also a link below via Facebook if you use that platform. ? ?[email or web address].si ?[email or web address]://[email or web address].no ? ?Parkinson's Wellness Recovery (PWR! Moves)  www.[email or web address] ?Info on the PWR! Virtual Experience:  You will have access to our expertise through self-assessment, guided plans that start with the PD-specific fundamentals, educational content, tips, Q&A with an expert, and a growing Art therapist of PD-specific pre-recorded and live exercise classes of varying types and intensity - both physical and cognitive! If that is not enough, we offer 1:1 wellness consultations (in-person or virtual) to personalize your PWR! Research scientist (medical).  ?[employer] Fridays:  ?As part of the PD Health @ Home program, this free video series focuses each week on one aspect of fitness designed to support people living with  Parkinson's.  These weekly videos highlight the [place] recent fitness guidelines for people with Parkinson's disease. ?www.KVTVnet.com.cy ?Dance for PD website is offering free, live-stream classes throughout the week, as well as links to [employer] of classes:  https://danceforparkinsons.org/ ?Virtual dance and Pilates for Parkinson's classes: Click on the Community Tab> Parkinson's Movement Initiative Tab.  To register for classes and for more information, visit www.[email or web address].co.za and click the ?community? tab.  ?[employer] Parkinson's Cycling Classes  ?[facility]:  Thursdays @ Noon-Live classes at [employer] ([employer] at [email or web address] or [phone]) ?[employer] [employer]: Virtual Classes Mondays and Thursdays Jeanette Caprice classes Tuesday, Wednesday and Thursday (contact [place] at [email or web address]  or [phone]) ?[employer] ?Varied levels of classes are offered Mondays, Tuesdays and Thursdays at [employer].  ?Stretching with Verdis Frederickson weekly class is also offered for people with Parkinson's ?To observe a class or for more information, call [phone] or email Hezzie Bump at [email or web address] ?ADDITIONAL SUPPORT AND RESOURCES ?[employer]:Online Caregiver Education Opportunities:  www.well-springsolutions.org/caregiver-education/caregiver-support-group.  You may also contact Vickki Muff at [email or web address] or [phone].    ?Well-Spring Navigator:  Just1Navigator program, a free service to help individuals and families through the journey of determining care for older adults.  The ?Navigator? is a [phone], Education officer, museum, who will speak with a prospective client and/or loved ones to provide an assessment of the situation and a set of recommendations for a personalized care plan -- all free of charge, and whether [employer]  offers the needed service or not. If the need is not a service we provide, we are well-connected with reputable programs in town that we can refer you to.  www.well-springsolutions.org or to speak with the Navigator,

## 2022-04-04 ENCOUNTER — Other Ambulatory Visit: Payer: Self-pay | Admitting: Cardiology

## 2022-07-06 ENCOUNTER — Other Ambulatory Visit: Payer: Self-pay | Admitting: Cardiology

## 2022-08-06 ENCOUNTER — Other Ambulatory Visit: Payer: Self-pay | Admitting: Cardiology

## 2022-08-30 NOTE — Progress Notes (Signed)
Assessment/Plan:   1.  Parkinsons Disease   -Increase carbidopa/levodopa 25/100, 2 tablet/2 tablet/2 tablet.    -add carbidopa/levodopa 25/100 CR at bed  -told him to not change/add medication without talking to me   2.  Chronic low back pain  -On chronic Dilaudid.  PDMP reviewed.  Remain concerned about the amount of Dilaudid, although it has slowly decreased with the course of time, going from 150 tablets/month to 135 tablets/month and currently at about 120 tablets/month.  Pt c/o pain today being disabling and f/u with Dr. Reatha Armour.  His inability to exercise does create issues with the Parkinsons Disease.  3.  eds  -Continue to suspect related to chronic narcotic therapy and the fact he sleeps all day and up all night (day/night reversal).    4.  Depression  -related primarily to pain issues.     Subjective:   Aaron Schmidt was seen today in follow up for Parkinsons disease.  Patient is with his wife who supplements the history.  States that he is not doing well.  "I'm coming apart inside."  States that back pain is worse.  On prednisone from dr Reatha Armour and it "took away some of the really, really sharp stabbing pain but otherwise it didn't help."  On 4 dilauded per day.   Has not had any falls but balance is "bad."  "I almost fall a hundred times a day."   No near syncopal episodes.  No hallucinations.  Current prescribed movement disorder medications: carbidopa/levodopa 25/100, 2 tablets/2 tablet / 1 tablet  (states that he sometimes takes another before bedtime if "I am shaky.")   PREVIOUS MEDICATIONS: Sinemet  ALLERGIES:   Allergies  Allergen Reactions   Baclofen Other (See Comments)    CONFUSION, BODY TREMORS   Doxazosin Mesylate Other (See Comments)    Other reaction(s): low BP   Diazepam Other (See Comments)    "makes goofy" Other reaction(s): confusion    CURRENT MEDICATIONS:  Outpatient Encounter Medications as of 09/03/2022  Medication Sig   aspirin EC 81  MG tablet Take 81 mg by mouth daily.   Carbidopa-Levodopa ER (SINEMET CR) 25-100 MG tablet controlled release Take 1 tablet by mouth at bedtime.   Cholecalciferol (VITAMIN D) 2000 UNITS CAPS Take 2,000 Units by mouth daily.   docusate sodium (COLACE) 100 MG capsule Take 600 mg by mouth daily.   finasteride (PROSCAR) 5 MG tablet Take 5 mg by mouth daily.   fluocinonide cream (LIDEX) 8.88 % Apply 1 application topically daily as needed (itching).   HYDROmorphone (DILAUDID) 2 MG tablet Take 2 tablets (4 mg total) by mouth 4 (four) times daily. (Patient taking differently: Take 4 mg by mouth See admin instructions. Take 1 tab every 5 hours as needed for pain do not exceed more than 5 per day)   L-Lysine 500 MG CAPS Take 500 mg by mouth daily.   losartan (COZAAR) 25 MG tablet Take 1 tablet (25 mg total) by mouth daily. Patient needs appointment for further refills. 1 st attempt   mineral oil liquid Take 108 mLs by mouth daily in the afternoon.    Multiple Vitamin (MULTIVITAMIN WITH MINERALS) TABS tablet Take 1 tablet by mouth daily. Mature mens 50+/Unknown Strength   nortriptyline (PAMELOR) 25 MG capsule Take 25 mg by mouth at bedtime.    OVER THE COUNTER MEDICATION Take 3 tablets by mouth daily. Perdiem 15 mg   polyethylene glycol (MIRALAX / GLYCOLAX) packet Take 17 g by mouth daily.  Potassium Chloride ER 20 MEQ TBCR Take 20 mEq by mouth daily.   pyridOXINE (VITAMIN B-6) 100 MG tablet Take 100 mg by mouth daily.   tamsulosin (FLOMAX) 0.4 MG CAPS capsule Take 0.4 mg by mouth daily.   tiZANidine (ZANAFLEX) 2 MG tablet Take 4 mg by mouth 3 (three) times daily.   vitamin B-12 (CYANOCOBALAMIN) 1000 MCG tablet Take 1,000 mcg by mouth daily.    vitamin C (ASCORBIC ACID) 500 MG tablet Take 500 mg by mouth daily.   [DISCONTINUED] carbidopa-levodopa (SINEMET IR) 25-100 MG tablet 2 tablets at 8am/2 tablets at noon/1 at 4pm   carbidopa-levodopa (SINEMET IR) 25-100 MG tablet 2 tablets at 8am/2 tablets at  noon/2 at 4pm   No facility-administered encounter medications on file as of 09/03/2022.    Objective:   PHYSICAL EXAMINATION:    VITALS:   Vitals:   09/03/22 1105  BP: 110/62  Pulse: 71  SpO2: 98%  Weight: 155 lb (70.3 kg)  Height: 5' 10.5" (1.791 m)       GEN:  The patient appears stated age and is in NAD. HEENT:  Normocephalic, atraumatic.  The mucous membranes are moist. The superficial temporal arteries are without ropiness or tenderness. CV:  RRR Lungs:  CTAB Neck/HEME:  There are no carotid bruits bilaterally.  Neurological examination:  Orientation: The patient is alert and oriented x3. Cranial nerves: There is good facial symmetry with facial hypomimia. The speech is fluent and clear. Soft palate rises symmetrically and there is no tongue deviation. Hearing is intact to conversational tone. Sensation: Sensation is intact to light touch throughout Motor: Strength is at least antigravity x4.  Movement examination: Tone: There is mild increased tone on the L (he is due for medication) Abnormal movements: none Coordination:  There is mild decremation bilaterally, L>R, LE>UE Gait and Station: Patient pushes off of the chair to arise.  The patient's stride length is decreased and he c/o pain with ambulation  I have reviewed and interpreted the following labs independently    Chemistry      Component Value Date/Time   NA 140 09/23/2020 1920   NA 142 06/30/2019 0905   K 4.0 09/23/2020 1920   CL 105 09/23/2020 1920   CO2 28 09/23/2020 1920   BUN 18 09/23/2020 1920   BUN 23 06/30/2019 0905   CREATININE 0.87 09/23/2020 1920      Component Value Date/Time   CALCIUM 9.3 09/23/2020 1920   ALKPHOS 69 07/21/2020 2119   AST 56 (H) 07/21/2020 2119   ALT 20 07/21/2020 2119   BILITOT 1.1 07/21/2020 2119       Lab Results  Component Value Date   WBC 6.7 09/23/2020   HGB 11.4 (L) 09/23/2020   HCT 34.7 (L) 09/23/2020   MCV 96.9 09/23/2020   PLT 165 09/23/2020     Lab Results  Component Value Date   TSH 0.731 09/21/2017     Total time spent on today's visit was 23 minutes, including both face-to-face time and nonface-to-face time.  Time included that spent on review of records (prior notes available to me/labs/imaging if pertinent), discussing treatment and goals, answering patient's questions and coordinating care.  Cc:  Javier Glazier, MD

## 2022-09-03 ENCOUNTER — Ambulatory Visit: Payer: Medicare Other | Admitting: Neurology

## 2022-09-03 ENCOUNTER — Encounter: Payer: Self-pay | Admitting: Neurology

## 2022-09-03 VITALS — BP 110/62 | HR 71 | Ht 70.5 in | Wt 155.0 lb

## 2022-09-03 DIAGNOSIS — M545 Low back pain, unspecified: Secondary | ICD-10-CM | POA: Diagnosis not present

## 2022-09-03 DIAGNOSIS — G8929 Other chronic pain: Secondary | ICD-10-CM

## 2022-09-03 DIAGNOSIS — G20A1 Parkinson's disease without dyskinesia, without mention of fluctuations: Secondary | ICD-10-CM | POA: Diagnosis not present

## 2022-09-03 MED ORDER — CARBIDOPA-LEVODOPA ER 25-100 MG PO TBCR: 1.0000 | EXTENDED_RELEASE_TABLET | Freq: Every day | ORAL | 1 refills | Status: DC

## 2022-09-03 MED ORDER — CARBIDOPA-LEVODOPA 25-100 MG PO TABS: ORAL_TABLET | ORAL | 1 refills | Status: DC

## 2022-09-03 NOTE — Patient Instructions (Addendum)
Take carbidopa/levodopa 25/100, 2 at 7am/11am/4pm ADD carbidopa/levodopa 25/100, ONE at bedtime  Local and Online Resources for Power over Parkinson's Group  December 2023    LOCAL  PARKINSON'S GROUPS   Power over Parkinson's Group:    Power Over Parkinson's Patient Education Group will be Wednesday, December 13th-*Hybrid meting*- in person at [facility] location and via [facility], 2:00-3:00 pm.   Starting in November, Power over [employer] and Care Partner Groups will meet together, with plans for separate break out session for caregivers (*this will be evolving over the next few months) Upcoming Power over Parkinson's Meetings/Care Partner Support:  2nd Wednesdays of the month at 2 pm:   December 13th, January 10th  [place] at [email or web address] if interested in participating in this group    [place] Party!  Wednesday, December 6th, 4:00-5:00 pm.  [facility] and Fitness.  RSVP to Garnetta Buddy at [phone] or [email or web address] New PWR! Moves Dynegy Instructor-Led Classes offering at [employer]!  TUESDAYS and Wednesdays 1-2 pm.   Contact Vonna Kotyk at  [email or web address]  or [phone] (Tuesday classes are modified for chair and standing only) Dance for Parkinson 's classes will be on Tuesdays 9:30am-10:30am starting October 3-December 12 with a break the week of November 21st. Located in the [employer] , in the first floor of the [employer] ([place] To register:  [email or web address] or [phone]  Drumming for Parkinson's will be held on 2nd and 4th Mondays at 11:00 am.   Located at the [place]  [place] at [email or web address] or [phone]  Through support from the [place] for Parkinson's  classes are free for both patients and caregivers.    [employer] Parkinson's Tai Chi Class, Mondays at 11 am.  Call [phone] for details   [place]:  [email or web address]  PD Health at Home continues:  Mindfulness Mondays, Wellness Wednesdays, Fitness Fridays   Upcoming Education:    Eating and Feeling Well through the [place]. Wednesday, Dec. 6th,  1-2 pm  Hospital Safety.  Wednesday, Dec. 13th, 1-2 pm Register for expert briefings (webinars) at [email or web address]  Please check out their website to sign up for emails and see their full online offerings      [place]:  [email or web address]   Third Thursday Webinars:  On the third Thursday of every month at 12 p.m. ET, join our free live webinars to learn about various aspects of living with Parkinson's disease and our work to speed medical breakthroughs.  Upcoming Webinar:  Tools for Diagnosing and Visualizing Parkinson's Disease.  Thursday, December 21st at 12 noon. Check out additional information on their website to see their full online offerings    [facility]:  [email or web address]  Upcoming Webinar:   Stay tuned  Webinar Series:  Living with Parkinson's Meetup.   Third Thursdays each month, 3 pm  Care Partner Monthly Meetup.  With Robin Searing Phinney.  First Tuesday of each month, 2 pm  Check out additional information to Live Well Today on their website    Parkinson and Movement Disorders (PMD) Alliance:  www.[email or web address]  [employer]:  Online Education Events  Sign up for emails, which are sent weekly to give you updates on programming and online offerings    Parkinson's Association of the [place]:  www.[email or web address]  Information on online support groups, education  events, and online exercises including Yoga, Parkinson's exercises and more-LOTS of  information on links to PD resources and online events  Virtual Support Group through Parkinson's Association of the [place]; next one is scheduled for Wednesday, December 6th  at 2 pm.  (These are typically scheduled for the 1st Wednesday of the month at 2 pm).  Visit website for details.   MOVEMENT AND EXERCISE OPPORTUNITIES  PWR! Moves Classes at [place].  Wednesdays 10 and 11 am.   Contact Amy Marriott, PT [email or web address] if interested.  NEW PWR! Moves Class offerings at [employer].  *TUESDAYS* and Wednesdays 1-2 pm.    Contact Vonna Kotyk at  [email or web address]    Parkinson's Wellness Recovery (PWR! Moves)  www.[email or web address]  Info on the PWR! Virtual Experience:  You will have access to our expertise?through self-assessment, guided plans that start with the PD-specific fundamentals, educational content, tips, Q&A with an expert, and a growing Art therapist of PD-specific pre-recorded and live exercise classes of varying types and intensity - both physical and cognitive! If that is not enough, we offer 1:1 wellness consultations (in-person or virtual) to personalize your PWR! Research scientist (medical).   [facility] Fridays:   As part of the PD Health @ Home program, this free video series focuses each week on one aspect of fitness designed to support people living with Parkinson's.? These weekly videos highlight the [place] fitness guidelines for people with Parkinson's disease.  [email or web address]   Dance for PD website is offering free, live-stream classes throughout the week, as well as links to [employer] of classes:  [email or web address]  Virtual dance and Pilates for Parkinson's classes: Click on the Community Tab> Parkinson's Movement Initiative Tab.  To register for classes and for more information, visit [email or web address] and click the "community"  tab.   [employer] Parkinson's Cycling Classes   [employer]:  Thursdays @ Noon-Live classes at [employer] ([employer] at [email or web address]?or ([phone])  [employer]: Virtual Classes Mondays and Thursdays Jeanette Caprice classes Tuesday, Wednesday and Thursday (contact [place] at [email or web address] ?or [phone])  [place]  Varied levels of classes are offered Tuesdays and Thursdays at [employer].   Stretching with Verdis Frederickson weekly class is also offered for people with Parkinson's  To observe a class or for more information, call [phone] or email Hezzie Bump at [email or web address]   ADDITIONAL SUPPORT AND RESOURCES  [employer]:Online Caregiver Education Opportunities:  www.well-springsolutions.org/caregiver-education/caregiver-support-group.  You may also contact Vickki Muff at [email or web address] or [phone].     Well-Spring Navigator:  Just1Navigator program, a?free service to help individuals and families through the journey of determining care for older adults.  The "Navigator" is a Education officer, museum, Arnell Asal, who will speak with a prospective client and/or loved ones to provide an assessment of the situation and a set of recommendations for a personalized care plan -- all free of charge, and whether?Well[employer]Spring [employer] offers the needed service or not. If the need is not a service we provide, we are well-connected with reputable programs in town that we can refer you to.  www.well-springsolutions.org or to speak with the Navigator, call [phone].

## 2022-09-09 ENCOUNTER — Other Ambulatory Visit: Payer: Self-pay | Admitting: Cardiology

## 2022-09-19 ENCOUNTER — Encounter: Payer: Self-pay | Admitting: Neurology

## 2022-09-25 ENCOUNTER — Telehealth: Payer: Self-pay | Admitting: Neurology

## 2022-09-25 NOTE — Telephone Encounter (Signed)
Pt called in wanting to see if he can add 2 carbidopa-levodopa pills around 8:00 PM?

## 2022-09-25 NOTE — Telephone Encounter (Signed)
Pt called an informed that Dr Tat stated No further increase in carbidopa/levodopa. That  He can try taking 1 levodopa at 4pm (instead of 2) and then another later in the evening (maybe at 7) and then the carbidopa/levodopa 25/100 at bed.  It doesn't help the spasm from his back and he is on a LOT of narcotic medication. Pt stated that he had already started the increase of 2/2/2/2/1 pt was advised not to do that he needed to take his carbidopa as directed 2/2/1/1/1 pt stated that he will take it as directed,

## 2022-09-25 NOTE — Telephone Encounter (Signed)
Pt called he stated that he has spasms in his left leg and at times in his right leg he said he has had 6 back surgery's in his life and they get worse at night. He said that his shakes get bad between the 4pm and the night dose of carbidopa levodopa and he needs something

## 2022-09-26 ENCOUNTER — Other Ambulatory Visit (HOSPITAL_BASED_OUTPATIENT_CLINIC_OR_DEPARTMENT_OTHER): Payer: Self-pay | Admitting: Neurological Surgery

## 2022-09-26 ENCOUNTER — Ambulatory Visit: Payer: Medicare Other | Admitting: Cardiology

## 2022-09-26 DIAGNOSIS — R600 Localized edema: Secondary | ICD-10-CM | POA: Insufficient documentation

## 2022-09-26 DIAGNOSIS — M48062 Spinal stenosis, lumbar region with neurogenic claudication: Secondary | ICD-10-CM

## 2022-10-01 ENCOUNTER — Other Ambulatory Visit: Payer: Self-pay | Admitting: Cardiology

## 2022-10-06 ENCOUNTER — Ambulatory Visit (HOSPITAL_BASED_OUTPATIENT_CLINIC_OR_DEPARTMENT_OTHER)
Admission: RE | Admit: 2022-10-06 | Discharge: 2022-10-06 | Disposition: A | Discharge status: Home / Self Care | Payer: Medicare Other | Source: Ambulatory Visit | Attending: Neurological Surgery | Admitting: Neurological Surgery

## 2022-10-06 DIAGNOSIS — M48062 Spinal stenosis, lumbar region with neurogenic claudication: Secondary | ICD-10-CM | POA: Insufficient documentation

## 2022-10-16 ENCOUNTER — Telehealth: Payer: Self-pay

## 2022-10-16 NOTE — Telephone Encounter (Signed)
   [facility] Pre-operative Risk Assessment    Request for surgical clearance:  What type of surgery is being performed? Thoracic Fusion(levels not specified).  When is this surgery scheduled?  TBD  What type of clearance is required (medical clearance vs. Pharmacy clearance to hold med vs. Both)? Both  Are there any medications that need to be held prior to surgery and how long?Not Specified however the patient is on Aspirin 59m   Practice name and name of physician performing surgery? [facility]    What is your office phone number: [phone]   7.   What is your office fax number: [phone].   Anesthesia type (None, local, MAC, general) ? General Anesthesia    JBasil DessPrevatt 10/16/2022, 11:26 AM  _________________________________________________________________   (provider comments below)

## 2022-10-16 NOTE — Telephone Encounter (Signed)
   Name: Aaron Schmidt  DOB: 18-Oct-1932  MRN: [id number]  Primary Cardiologist: Berniece Salines, DO  Chart reviewed as part of pre-operative protocol coverage. The patient has an upcoming visit scheduled with Dr. Agustin Cree on 10/31/2022 at which time clearance can be addressed in case there are any issues that would impact surgical recommendations.  Thoracic fusion is not scheduled until TBD as below. I added preop FYI to appointment note so that provider is aware to address at time of outpatient visit.  Per office protocol the cardiology provider should forward their finalized clearance decision and recommendations regarding antiplatelet therapy to the requesting party below.   I will route this message as FYI to requesting party and remove this message from the preop box as separate preop APP input not needed at this time.   Please call with any questions.  Lenna Sciara, NP  10/16/2022, 11:41 AM

## 2022-10-17 ENCOUNTER — Other Ambulatory Visit: Payer: Self-pay | Admitting: Neurological Surgery

## 2022-10-23 NOTE — Progress Notes (Signed)
Surgical Instructions    Your procedure is scheduled on Thursday February 8th.  Report to [facility] Main Entrance "A" at 5:30 A.M., then check in with the Admitting office.  Call this number if you have problems the morning of surgery:  [phone]   If you have any questions prior to your surgery date call [phone]: Open Monday-Friday 8am-4pm If you experience any cold or flu symptoms such as cough, fever, chills, shortness of breath, etc. between now and your scheduled surgery, please notify us at the above number     Remember:  Do not eat or drink after midnight the night before your surgery      Take these medicines the morning of surgery with A SIP OF WATER:  carbidopa-levodopa (SINEMET IR) 25-100 MG tablet  finasteride (PROSCAR) 5 MG tablet  pregabalin (LYRICA) 150 MG capsule  tamsulosin (FLOMAX) 0.4 MG CAPS capsule    IF NEEDED HYDROmorphone (DILAUDID) 2 MG tablet  Polyethyl Glycol-Propyl Glycol (LUBRICANT EYE DROPS) 0.4-0.3 % SOLN  tiZANidine (ZANAFLEX) 2 MG tablet   Follow your surgeon's instructions on when to stop Aspirin.  If no instructions were given by your surgeon then you will need to call the office to get those instructions.    As of today, STOP taking any Aspirin (unless otherwise instructed by your surgeon) Aleve, Naproxen, Ibuprofen, Motrin, Advil, Goody's, BC's, all herbal medications, fish oil, and all vitamins.           Do not wear jewelry . Do not wear lotions, powders, cologne or deodorant. Do not shave 48 hours prior to surgery.  Men may shave face and neck. Do not bring valuables to the hospital. Do not wear nail polish  [facility] is not responsible for any belongings or valuables.    Do NOT Smoke (Tobacco/Vaping)  24 hours prior to your procedure  If you use a CPAP at night, you may bring your mask for your overnight stay.   Contacts, glasses, hearing aids, dentures or partials may not be worn into surgery, please bring cases for  these belongings   For patients admitted to the hospital, discharge time will be determined by your treatment team.   Patients discharged the day of surgery will not be allowed to drive home, and someone needs to stay with them for 24 hours.   SURGICAL WAITING ROOM VISITATION Patients having surgery or a procedure may have no more than 2 support people in the waiting area - these visitors may rotate.   Children under the age of 22 must have an adult with them who is not the patient. If the patient needs to stay at the hospital during part of their recovery, the visitor guidelines for inpatient rooms apply. Pre-op nurse will coordinate an appropriate time for 1 support person to accompany patient in pre-op.  This support person may not rotate.   Please refer to [email or web address] for the visitor guidelines for Inpatients (after your surgery is over and you are in a regular room).    Special instructions:    Oral Hygiene is also important to reduce your risk of infection.  Remember - BRUSH YOUR TEETH THE MORNING OF SURGERY WITH YOUR REGULAR TOOTHPASTE   [place]- Preparing For Surgery  Before surgery, you can play an important role. Because skin is not sterile, your skin needs to be as free of germs as possible. You can reduce the number of germs on your skin by washing with CHG (chlorahexidine gluconate) Soap before surgery.  CHG  is an antiseptic cleaner which kills germs and bonds with the skin to continue killing germs even after washing.     Please do not use if you have an allergy to CHG or antibacterial soaps. If your skin becomes reddened/irritated stop using the CHG.  Do not shave (including legs and underarms) for at least 48 hours prior to first CHG shower. It is OK to shave your face.  Please follow these instructions carefully.     Shower the NIGHT BEFORE SURGERY and the MORNING OF SURGERY with CHG Soap.   If you chose  to wash your hair, wash your hair first as usual with your normal shampoo. After you shampoo, rinse your hair and body thoroughly to remove the shampoo.  Then [employer] and genitals (private parts) with your normal soap and rinse thoroughly to remove soap.  After that Use CHG Soap as you would any other liquid soap. You can apply CHG directly to the skin and wash gently with a scrungie or a clean washcloth.   Apply the CHG Soap to your body ONLY FROM THE NECK DOWN.  Do not use on open wounds or open sores. Avoid contact with your eyes, ears, mouth and genitals (private parts). Wash Face and genitals (private parts)  with your normal soap.   Wash thoroughly, paying special attention to the area where your surgery will be performed.  Thoroughly rinse your body with warm water from the neck down.  DO NOT shower/wash with your normal soap after using and rinsing off the CHG Soap.  Pat yourself dry with a CLEAN TOWEL.  Wear CLEAN PAJAMAS to bed the night before surgery  Place CLEAN SHEETS on your bed the night before your surgery  DO NOT SLEEP WITH PETS.   Day of Surgery:  Take a shower with CHG soap. Wear Clean/Comfortable clothing the morning of surgery Do not apply any deodorants/lotions.   Remember to brush your teeth WITH YOUR REGULAR TOOTHPASTE.    If you received a COVID test during your pre-op visit, it is requested that you wear a mask when out in public, stay away from anyone that may not be feeling well, and notify your surgeon if you develop symptoms. If you have been in contact with anyone that has tested positive in the last 10 days, please notify your surgeon.    Please read over the following fact sheets that you were given.

## 2022-10-24 ENCOUNTER — Encounter (HOSPITAL_COMMUNITY)
Admission: RE | Admit: 2022-10-24 | Discharge: 2022-10-24 | Disposition: A | Discharge status: Home / Self Care | Payer: Medicare Other | Source: Ambulatory Visit | Attending: Neurological Surgery | Admitting: Neurological Surgery

## 2022-10-24 ENCOUNTER — Encounter (HOSPITAL_COMMUNITY): Payer: Self-pay

## 2022-10-24 ENCOUNTER — Other Ambulatory Visit: Payer: Self-pay

## 2022-10-24 VITALS — BP 118/61 | HR 76 | Temp 97.5°F | Resp 18 | Ht 70.0 in | Wt 158.7 lb

## 2022-10-24 DIAGNOSIS — Z01818 Encounter for other preprocedural examination: Secondary | ICD-10-CM | POA: Diagnosis present

## 2022-10-24 DIAGNOSIS — I10 Essential (primary) hypertension: Secondary | ICD-10-CM | POA: Diagnosis not present

## 2022-10-24 LAB — TYPE AND SCREEN
ABO/RH(D): A POS
Antibody Screen: NEGATIVE

## 2022-10-24 LAB — SURGICAL PCR SCREEN
MRSA, PCR: NEGATIVE
Staphylococcus aureus: NEGATIVE

## 2022-10-24 NOTE — Progress Notes (Signed)
Surgical Instructions    Your procedure is scheduled on Thursday February 8th.  Report to [facility] Main Entrance "A" at 5:30 A.M., then check in with the Admitting office.  Call this number if you have problems the morning of surgery:  [phone]   If you have any questions prior to your surgery date call [phone]: Open Monday-Friday 8am-4pm If you experience any cold or flu symptoms such as cough, fever, chills, shortness of breath, etc. between now and your scheduled surgery, please notify us at the above number     Remember:  Do not eat or drink after midnight the night before your surgery      Take these medicines the morning of surgery with A SIP OF WATER:  carbidopa-levodopa (SINEMET IR) 25-100 MG tablet  finasteride (PROSCAR) 5 MG tablet  pregabalin (LYRICA) 150 MG capsule  tamsulosin (FLOMAX) 0.4 MG CAPS capsule    IF NEEDED HYDROmorphone (DILAUDID) 2 MG tablet  Polyethyl Glycol-Propyl Glycol (LUBRICANT EYE DROPS) 0.4-0.3 % SOLN  tiZANidine (ZANAFLEX) 2 MG tablet   Follow your surgeon's instructions on when to stop Aspirin.  If no instructions were given by your surgeon then you will need to call the office to get those instructions.    As of today, STOP taking any Aleve, Naproxen, Ibuprofen, Motrin, Advil, Goody's, BC's, all herbal medications, fish oil, and all vitamins.             [place] is not responsible for any belongings or valuables.    Do NOT Smoke (Tobacco/Vaping)  24 hours prior to your procedure  If you use a CPAP at night, you may bring your mask for your overnight stay.   Contacts, glasses, hearing aids, dentures or partials may not be worn into surgery, please bring cases for these belongings   For patients admitted to the hospital, discharge time will be determined by your treatment team.   Patients discharged the day of surgery will not be allowed to drive home, and someone needs to stay with them for 24 hours.   SURGICAL WAITING  ROOM VISITATION Patients having surgery or a procedure may have no more than 2 support people in the waiting area - these visitors may rotate.   Children under the age of 29 must have an adult with them who is not the patient. If the patient needs to stay at the hospital during part of their recovery, the visitor guidelines for inpatient rooms apply. Pre-op nurse will coordinate an appropriate time for 1 support person to accompany patient in pre-op.  This support person may not rotate.   Please refer to [email or web address] for the visitor guidelines for Inpatients (after your surgery is over and you are in a regular room).    Special instructions:    Oral Hygiene is also important to reduce your risk of infection.  Remember - BRUSH YOUR TEETH THE MORNING OF SURGERY WITH YOUR REGULAR TOOTHPASTE   [place]- Preparing For Surgery  Before surgery, you can play an important role. Because skin is not sterile, your skin needs to be as free of germs as possible. You can reduce the number of germs on your skin by washing with CHG (chlorahexidine gluconate) Soap before surgery.  CHG is an antiseptic cleaner which kills germs and bonds with the skin to continue killing germs even after washing.     Please do not use if you have an allergy to CHG or antibacterial soaps. If your skin becomes reddened/irritated stop using the CHG.  Do not shave (including legs and underarms) for at least 48 hours prior to first CHG shower. It is OK to shave your face.  Please follow these instructions carefully.     Shower the NIGHT BEFORE SURGERY and the MORNING OF SURGERY with CHG Soap.   If you chose to wash your hair, wash your hair first as usual with your normal shampoo. After you shampoo, rinse your hair and body thoroughly to remove the shampoo.  Then [employer] and genitals (private parts) with your normal soap and rinse thoroughly to remove soap.  After  that Use CHG Soap as you would any other liquid soap. You can apply CHG directly to the skin and wash gently with a scrungie or a clean washcloth.   Apply the CHG Soap to your body ONLY FROM THE NECK DOWN.  Do not use on open wounds or open sores. Avoid contact with your eyes, ears, mouth and genitals (private parts). Wash Face and genitals (private parts)  with your normal soap.   Wash thoroughly, paying special attention to the area where your surgery will be performed.  Thoroughly rinse your body with warm water from the neck down.  DO NOT shower/wash with your normal soap after using and rinsing off the CHG Soap.  Pat yourself dry with a CLEAN TOWEL.  Wear CLEAN PAJAMAS to bed the night before surgery  Place CLEAN SHEETS on your bed the night before your surgery  DO NOT SLEEP WITH PETS.   Day of Surgery:  Take a shower with CHG soap. Wear Clean/Comfortable clothing the morning of surgery Do not wear jewelry . Do not wear lotions, powders, cologne or deodorant. Do not shave 48 hours prior to surgery.  Men may shave face and neck. Do not bring valuables to the hospital. Do not wear nail polish Remember to brush your teeth WITH YOUR REGULAR TOOTHPASTE.    If you received a COVID test during your pre-op visit, it is requested that you wear a mask when out in public, stay away from anyone that may not be feeling well, and notify your surgeon if you develop symptoms. If you have been in contact with anyone that has tested positive in the last 10 days, please notify your surgeon.    Please read over the following fact sheets that you were given.

## 2022-10-24 NOTE — Progress Notes (Addendum)
PCP - Dr. Merilynn Finland Cardiologist - Dr. Agustin Cree Nephrology: Dr. Donato Heinz. Note under media tab from July 2023  PPM/ICD - denies  Chest x-ray - n/a EKG - 10/24/2022 Stress Test - denies ECHO - 08/21/2021 Cardiac Cath - 03/23/2019  Sleep Study - 15 years ago. Positive for sleep apnea CPAP - Does not wear his CPAP  Fasting Blood Sugar - non-diabetic  Last dose of GLP1 agonist-  non-diabetic  Aspirin Instructions: Told to stop ASA 1 week prior to surgery. Patient stopped taking ASA 10/17/2022  ERAS Protcol - NO; NPO  COVID TEST- denies   Anesthesia review: Yes. LBBB, heart murmer, HTN.  Patient is scheduled to see Dr. Agustin Cree, Cardiologist, on 10/31/2022 for cardiac clearance for surgery. Informed Myra Gianotti, PA and Karoline Caldwell, [place] for possible face to face visit with patient during PAT.  Since patient will be seeing cardiology on 2/7, face to face not needed by Anesthesia PA.   Patient denies shortness of breath, fever, cough and chest pain at PAT appointment   All instructions explained to the patient, with a verbal understanding of the material. Patient agrees to go over the instructions while at home for a better understanding. Patient also instructed to self quarantine after being tested for COVID-19. The opportunity to ask questions was provided.

## 2022-10-25 NOTE — Progress Notes (Addendum)
Anesthesia Chart Review:  Case: [id number] Date/Time: 11/01/22 0715   Procedure: OPEN THORACOLUMBAR DECOMP,DISCECTOMY, INSTRUMENTATION AND FUSION; EXPLORE L1-3 FUSION   Anesthesia type: General   Pre-op diagnosis: THORACIC SPINAL STENOSIS   Location: [place] ROOM 26 / [place]   Surgeons: Dawley, Theodoro Doing, DO       DISCUSSION: Patient is an 89 year old male scheduled for the above procedure.  History includes former smoker (quit 09/24/78), HTN, HLD, CAD (NSTEMI, EF 45-50%->no CAD by 03/23/19 LHC, possibly related to mild myocarditis; LVEF 60-65% 08/21/21), murmur/MV disorder (mild-moderate MR, mild MS 08/21/21 echo), LBBB, Parkinson's  disease, OSA (severe 09/2010 does not use CPAP), impaired fasting glucose, CKD (stage 3), BPH, anemia, thrombocytopenia, peripheral edema, empyema (s/p left VATS/decortication 09/09/17), spinal surgery (L3-4 anterolateral decompression/plating 10/06/09, L3-4 posterolateral arthrodesis 07/20/10; C3-7 posterior fusion 06/18/14; L1-3 ALIF 09/22/19).  Last neurology visit with Dr. Carles Collet was on 09/03/22. Carbidopa/levodopa increased. Six month follow-up planned.  Last ASA 10/17/22. He had CBC and CMP on 10/11/12 (See [facility]). H/H 10.0/30.2, PLT 168, Cr 0.98, glucose 88.  He is scheduled for preoperative cardiology evaluation by Dr. Agustin Cree on 10/31/22.   ADDENDUM 10/31/22 4:07 PM: Preoperative cardiology evaluation by Dr. Agustin Cree today. Echo ordered and reviewed. He wrote, "I reviewed his echocardiogram, left ventricular ejection fraction is preserved, there is myxomatous mitral valve with possibly flail posterior leaflet of the mitral valve however regurgitation is unchanged as compared to prior echocardiogram with mild to moderate which is directed anteriorly, left atrium is significantly enlarged.  I do not see any evidence of significant mitral stenosis.  Therefore taking all decision on the consideration he should be a reasonable candidate for surgery as scheduled.   Will need to pay special attention to fluid status.  He need to be meticulously monitored from hemodynamic point of view.  Will need to avoid excessive fluids."  VS: BP 118/61   Pulse 76   Temp (!) 36.4 C (Oral)   Resp 18   Ht '5\' 10"'$  (1.778 m)   Wt 72 kg   SpO2 100%   BMI 22.77 kg/m    PROVIDERS: Javier Glazier, MD is PCP  Jenne Campus, MD is cardiologist Tat, Wells Guiles, DO is neurologist Donato Heinz, MD is nephrologist    LABS: He had CBC with diff and CMP on 10/11/22 through [facility] (see Care Everywhere). Results include WBC 5.7, H/H 10.0/30.2, PLT 168K, Na 139, K 4.2, BUN 23, Cr 0.98, glucose 88, Calcium 9.3, AST 22, ALT 4.  (all labs ordered are listed, but only abnormal results are displayed)  Labs Reviewed  SURGICAL PCR SCREEN  TYPE AND SCREEN    IMAGES: MRI L-spine 10/06/12: IMPRESSION: 1. At T12-L1 there is a large right paracentral disc extrusion with cephalad migration of disc material fluid hand possible small epidural hematoma component with the overall area measuring 15 x 9 x 27 mm and resulting in mass effect on the right intraspinal T12 nerve root. Mild bilateral facet arthropathy. Moderate spinal stenosis. Moderate right foraminal stenosis. No left foraminal stenosis. 2. Posterior lumbar fusion from L1 through S1 as described above. No foraminal or central canal stenosis.    EKG: 10/24/22: Normal sinus rhythm Possible Left atrial enlargement Left axis deviation Left bundle branch block Abnormal ECG When compared with ECG of 21-Jul-2020 21:04, PREVIOUS ECG IS PRESENT No significant change since last tracing Confirmed by Kirk Ruths [phone]) on 10/24/2022 1:48:48 PM   CV: Echo 10/31/22: IMPRESSIONS   1. Left ventricular ejection  fraction, by estimation, is 55 to 60%. The  left ventricle has normal function. The left ventricle has no regional  wall motion abnormalities. Left ventricular diastolic parameters are  indeterminate.    2. Right ventricular systolic function is normal. The right ventricular  size is normal.   3. Left atrial size was moderately dilated.   4. Possibly flailed posterior leaflet of MV. Unchange as compared to old  echo. The mitral valve is myxomatous. Moderate mitral valve regurgitation.  No evidence of mitral stenosis.   5. The aortic valve is normal in structure. Aortic valve regurgitation is  not visualized. Aortic valve sclerosis/calcification is present, without  any evidence of aortic stenosis.   6. The inferior vena cava is normal in size with greater than 50%  respiratory variability, suggesting right atrial pressure of 3 mmHg.  - Comparison 08/11/21: LVEF 60-65%, cannot rule out mild MV flail leaflet, mild-moderate MR, Mild MS, mild late systolic prolapse of the middle scallop of  the posterior leaflet of the mitral valve.    Cardiac cath 03/23/19: 1. No angiographic evidence of CAD Recommendations: No further ischemic workup.    Past Medical History:  Diagnosis Date   Acute kidney injury superimposed on chronic kidney disease ([place]) 09/05/2017   Anemia 09/21/2017   Anemia due to chronic blood loss 01/10/2021   Arthritis    Degeneration spine & stenosis   Benign prostatic hyperplasia 01/10/2021   Benign prostatic hyperplasia with lower urinary tract symptoms 01/10/2021   Body mass index (BMI) 25.0-25.9, adult 08/31/2019   CAD (coronary artery disease) 04/20/2019   Cervical myelopathy with cervical radiculopathy (HCC) 06/24/2014   Cervical spondylosis without myelopathy 05/12/2014   Chronic bilateral low back pain 07/26/2020   Chronic kidney disease, stage 3 unspecified ([place]) 01/10/2021   Chronic low back pain 07/29/2013   Chronic pain 09/05/2017   Degenerative cervical spinal stenosis 05/26/2014   Degenerative lumbar spinal stenosis 09/22/2019   Displacement of cervical intervertebral disc 07/19/2014   Edema 01/10/2021   Elevated troponin 09/05/2017   Empyema lung ([place]) 09/09/2017    Enlarged prostate 01/10/2021   Essential hypertension 08/29/2010   Qualifier: Diagnosis of  By: Elsworth Soho MD, Leanna Sato     Fall in home 07/10/2017   Hardening of the aorta (main artery of the heart) ([place]) 01/10/2021   History of kidney stones    Hyperlipidemia 06/21/2014   Hypertension    has a histroy of   Hypoalbuminemia 01/10/2021   Hyponatremia 09/05/2017   Hypotension    Impaired fasting glucose 01/10/2021   Intermittent tremor 01/10/2021   Iron deficiency anemia 01/10/2021   Kidney stone 01/10/2021   LBBB (left bundle branch block) 06/21/2014   Lumbar post-laminectomy syndrome 07/15/2013   Lumbar radiculopathy 07/29/2013   Lumbar stenosis with neurogenic claudication 08/31/2019   Malnutrition of moderate degree 03/23/2019   Mitral valve disorder 01/10/2021   Murmur 01/08/2017   Muscle pain 03/01/2020   Myocardial infarction ([place]) 03/21/2019   Neck pain 03/03/2014   Normochromic normocytic anemia 09/05/2017   NSTEMI (non-ST elevated myocardial infarction) ([place]) 03/21/2019   Obesity 04/06/2019   Obstructive sleep apnea 09/30/2010   PSG (180 lbs ) >> severe obstructive sleep apnea with AHi 38/h & desatns to 82%  Started on auto 5-15  >> changed to 8 cm Download reviewed 1/21 - 11/23/10 >> residual events of 6/h , avg pr 8 cm, leak + with nasal pillows     Occasional tremors 07/26/2020   Old myocardial infarction 01/10/2021  Osteoarthrosis 01/10/2021   Osteopenia 01/10/2021   Other specified disorders of kidney and ureter 01/10/2021   Parkinson's disease    Peripheral edema 01/10/2021   Peripheral neuralgia 01/10/2021   Personal history of colonic polyps 01/10/2021   Personal history of other diseases of the respiratory system 01/10/2021   Pleural effusion on left 08/19/2017   Acute symptoms since 08/13/17 > CTa  08/13/17  neg pe/ extensively loculated effusion - L thoracentesis 08/20/2017 :  220 cc with glucose < 20 and WBC 5,338 mostly Poly, LDH 688 > cyt neg >Referrd to T surgery > VATS 09/09/17 c/w  empyema   Pleural effusion, left 09/04/2017   Preoperative cardiovascular examination 06/21/2014   Protein-calorie malnutrition, severe 09/11/2017   Pure hypercholesterolemia 01/10/2021   Sciatica 01/10/2021   Scoliosis concern 08/31/2019   Sensorineural hearing loss (SNHL) of both ears 06/15/2020   Last Assessment & Plan:  Formatting of this note might be different from the original. Concern over worsening hearing. Chronic history of sensorineural hearing loss bilaterally.  Has hearing aids currently.  He feels his hearing aids are not working as well as they historically have.  Recently had them checked. EXAM shows normal external canals and tympanic membranes bilaterally. AUDIOGRAM Shows B   Shoulder joint pain 12/01/2013   Sinusitis, chronic 08/19/2017   CT sinus  08/20/2017 >>> Normally aerated paranasal sinuses.  Patent sinus drainage pathways.   SIRS (systemic inflammatory response syndrome) ([place]) 09/05/2017   Skin ulcer of toe of left foot, limited to breakdown of skin ([place]) 09/04/2016   Sleep apnea 2012   used CPAP 2 yrs. ago, feels he sleeps better w/o, no longer using    Spinal stenosis of lumbar region 08/10/2013   Spinal stenosis, lumbar region without neurogenic claudication 08/19/2019   Symptomatic anemia 09/20/2017   Thrombocytopenia ([place]) 01/10/2021   Tubular adenoma of colon 01/10/2021   Unspecified abnormal finding in specimens from other organs, systems and tissues 01/10/2021    Past Surgical History:  Procedure Laterality Date   ANTERIOR LAT LUMBAR FUSION N/A 09/22/2019   Procedure: Lumbar one-two, Lumbar two-three Anterolateral lumbar interbody fusion with pedicle screw fixation and exploration of adjacent fusion;  Surgeon: Erline Levine, MD;  Location: [facility];  Service: Neurosurgery;  Laterality: N/A;   ARM NEUROPLASTY     at 14 yrs. of age- fell off tractor- had fracture & repair *& later- 1990's had  transplantation of a nerve at the elbow   BACK SURGERY     x4 back  surgery x2 fusion -   CARDIAC CATHETERIZATION  03/23/2019   CARPAL TUNNEL RELEASE Right    COLONOSCOPY     ESOPHAGOGASTRODUODENOSCOPY (EGD) WITH PROPOFOL N/A 09/23/2017   Procedure: ESOPHAGOGASTRODUODENOSCOPY (EGD) WITH PROPOFOL;  Surgeon: Laurence Spates, MD;  Location: [facility];  Service: Endoscopy;  Laterality: N/A;   LEFT HEART CATH AND CORONARY ANGIOGRAPHY N/A 03/23/2019   Procedure: LEFT HEART CATH AND CORONARY ANGIOGRAPHY;  Surgeon: Burnell Blanks, MD;  Location: [facility];  Service: Cardiovascular;  Laterality: N/A;   LUMBAR PERCUTANEOUS PEDICLE SCREW 2 LEVEL N/A 09/22/2019   Procedure: LUMBAR PERCUTANEOUS PEDICLE SCREW TWO LEVEL;  Surgeon: Erline Levine, MD;  Location: [facility];  Service: Neurosurgery;  Laterality: N/A;   PLEURAL EFFUSION DRAINAGE Left 09/09/2017   Procedure: DRAINAGE OF PLEURAL EFFUSION;  Surgeon: [name], MD;  Location: [facility];  Service: Thoracic;  Laterality: Left;   POSTERIOR CERVICAL FUSION/FORAMINOTOMY N/A 06/24/2014   Procedure: Posterior Cervical Three-Seven Fusion with Lateral  Mass Fixation;  Surgeon: Erline Levine, MD;  Location: [facility];  Service: Neurosurgery;  Laterality: N/A;  C3-C7 posterior cervical fusion with lateral mass fixation   SHOULDER SURGERY Bilateral    VIDEO ASSISTED THORACOSCOPY (VATS)/DECORTICATION Left 09/09/2017   Procedure: VIDEO ASSISTED THORACOSCOPY (VATS)/DECORTICATION;  Surgeon: [name], MD;  Location: [facility];  Service: Thoracic;  Laterality: Left;   VIDEO BRONCHOSCOPY  09/09/2017   Procedure: VIDEO BRONCHOSCOPY;  Surgeon: [name], MD;  Location: [facility];  Service: Thoracic;;    MEDICATIONS:  aspirin EC 81 MG tablet   carbidopa-levodopa (SINEMET IR) 25-100 MG tablet   Carbidopa-Levodopa ER (SINEMET CR) 25-100 MG tablet controlled release   docusate sodium (COLACE) 100 MG capsule   finasteride (PROSCAR) 5 MG tablet   fluocinonide cream (LIDEX) 0.05 %   furosemide (LASIX)  20 MG tablet   HYDROmorphone (DILAUDID) 2 MG tablet   L-LYSINE PO   losartan (COZAAR) 25 MG tablet   mineral oil liquid   Multiple Minerals-Vitamins (CAL MAG ZINC +D3 PO)   Multiple Vitamin (MULTIVITAMIN WITH MINERALS) TABS tablet   nortriptyline (PAMELOR) 25 MG capsule   Polyethyl Glycol-Propyl Glycol (LUBRICANT EYE DROPS) 0.4-0.3 % SOLN   pregabalin (LYRICA) 150 MG capsule   pyridOXINE (VITAMIN B-6) 100 MG tablet   Sennosides 15 MG TABS   simvastatin (ZOCOR) 40 MG tablet   SUPER B COMPLEX/C PO   tamsulosin (FLOMAX) 0.4 MG CAPS capsule   tiZANidine (ZANAFLEX) 2 MG tablet   traZODone (DESYREL) 150 MG tablet   vitamin B-12 (CYANOCOBALAMIN) 1000 MCG tablet   vitamin C (ASCORBIC ACID) 500 MG tablet   VITAMIN D PO   No current facility-administered medications for this encounter.    Myra Gianotti, PA-C Surgical Short Stay/Anesthesiology [facility] Phone [phone] [facility] Phone [phone] 10/25/2022 7:07 PM

## 2022-10-26 ENCOUNTER — Other Ambulatory Visit: Payer: Self-pay | Admitting: Cardiology

## 2022-10-30 DIAGNOSIS — R7301 Impaired fasting glucose: Secondary | ICD-10-CM | POA: Insufficient documentation

## 2022-10-30 DIAGNOSIS — N2889 Other specified disorders of kidney and ureter: Secondary | ICD-10-CM | POA: Insufficient documentation

## 2022-10-30 DIAGNOSIS — R899 Unspecified abnormal finding in specimens from other organs, systems and tissues: Secondary | ICD-10-CM | POA: Insufficient documentation

## 2022-10-30 DIAGNOSIS — I252 Old myocardial infarction: Secondary | ICD-10-CM | POA: Insufficient documentation

## 2022-10-31 ENCOUNTER — Encounter: Payer: Self-pay | Admitting: Cardiology

## 2022-10-31 ENCOUNTER — Other Ambulatory Visit: Payer: Self-pay | Admitting: Cardiology

## 2022-10-31 ENCOUNTER — Other Ambulatory Visit: Payer: Self-pay | Admitting: Neurological Surgery

## 2022-10-31 ENCOUNTER — Ambulatory Visit (HOSPITAL_BASED_OUTPATIENT_CLINIC_OR_DEPARTMENT_OTHER)
Admission: RE | Admit: 2022-10-31 | Discharge: 2022-10-31 | Disposition: A | Discharge status: Home / Self Care | Payer: Medicare Other | Source: Ambulatory Visit | Attending: Cardiology | Admitting: Cardiology

## 2022-10-31 ENCOUNTER — Ambulatory Visit: Payer: Medicare Other | Attending: Cardiology | Admitting: Cardiology

## 2022-10-31 VITALS — BP 102/50 | HR 76 | Ht 70.0 in | Wt 150.0 lb

## 2022-10-31 DIAGNOSIS — I340 Nonrheumatic mitral (valve) insufficiency: Secondary | ICD-10-CM | POA: Diagnosis not present

## 2022-10-31 DIAGNOSIS — I447 Left bundle-branch block, unspecified: Secondary | ICD-10-CM

## 2022-10-31 DIAGNOSIS — G4733 Obstructive sleep apnea (adult) (pediatric): Secondary | ICD-10-CM

## 2022-10-31 DIAGNOSIS — R0609 Other forms of dyspnea: Secondary | ICD-10-CM

## 2022-10-31 DIAGNOSIS — I10 Essential (primary) hypertension: Secondary | ICD-10-CM

## 2022-10-31 LAB — ECHOCARDIOGRAM COMPLETE
Area-P 1/2: 4.71 cm2
Height: 70 in
MV M vel: 3.83 m/s
MV Peak grad: 58.7 mmHg
MV VTI: 2.12 cm2
S' Lateral: 3.6 cm
Weight: 2400 oz

## 2022-10-31 NOTE — Anesthesia Preprocedure Evaluation (Addendum)
Anesthesia Evaluation  Patient identified by MRN, date of birth, ID band Patient awake    Reviewed: Allergy & Precautions, H&P , NPO status , Patient's Chart, lab work & pertinent test results  Airway Mallampati: III  TM Distance: <3 FB Neck ROM: Limited    Dental no notable dental hx.    Pulmonary sleep apnea , former smoker   Pulmonary exam normal breath sounds clear to auscultation       Cardiovascular hypertension, + CAD and + Past MI  Normal cardiovascular exam Rhythm:Regular Rate:Normal  Echo 10/31/22: IMPRESSIONS   1. Left ventricular ejection fraction, by estimation, is 55 to 60%. The  left ventricle has normal function. The left ventricle has no regional  wall motion abnormalities. Left ventricular diastolic parameters are  indeterminate.   2. Right ventricular systolic function is normal. The right ventricular  size is normal.   3. Left atrial size was moderately dilated.   4. Possibly flailed posterior leaflet of MV. Unchange as compared to old  echo. The mitral valve is myxomatous. Moderate mitral valve regurgitation.  No evidence of mitral stenosis.   5. The aortic valve is normal in structure. Aortic valve regurgitation is  not visualized. Aortic valve sclerosis/calcification is present, without  any evidence of aortic stenosis.   6. The inferior vena cava is normal in size with greater than 50%  respiratory variability, suggesting right atrial pressure of 3 mmHg.     Neuro/Psych Parkinsons dz  Neuromuscular disease  negative psych ROS   GI/Hepatic negative GI ROS, Neg liver ROS,,,  Endo/Other  negative endocrine ROS    Renal/GU Renal disease  negative genitourinary   Musculoskeletal negative musculoskeletal ROS (+)    Abdominal   Peds negative pediatric ROS (+)  Hematology negative hematology ROS (+)   Anesthesia Other Findings   Reproductive/Obstetrics negative OB ROS                              Anesthesia Physical Anesthesia Plan  ASA: 3  Anesthesia Plan: General   Post-op Pain Management:    Induction: Intravenous  PONV Risk Score and Plan: 2 and Ondansetron, Dexamethasone and Treatment may vary due to age or medical condition  Airway Management Planned: Oral ETT  Additional Equipment: Arterial line  Intra-op Plan:   Post-operative Plan: Extubation in OR  Informed Consent: I have reviewed the patients History and Physical, chart, labs and discussed the procedure including the risks, benefits and alternatives for the proposed anesthesia with the patient or authorized representative who has indicated his/her understanding and acceptance.     Dental advisory given  Plan Discussed with: CRNA and Surgeon  Anesthesia Plan Comments: (PAT note written by Myra Gianotti, PA-C.  )        Anesthesia Quick Evaluation

## 2022-10-31 NOTE — Patient Instructions (Signed)
Medication Instructions:  Your physician recommends that you continue on your current medications as directed. Please refer to the Current Medication list given to you today.  *If you need a refill on your cardiac medications before your next appointment, please call your pharmacy*   Lab Work: None Ordered If you have labs (blood work) drawn today and your tests are completely normal, you will receive your results only by: [employer] (if you have MyChart) OR A paper copy in the mail If you have any lab test that is abnormal or we need to change your treatment, we will call you to review the results.   Testing/Procedures: Your physician has requested that you have an echocardiogram. Echocardiography is a painless test that uses sound waves to create images of your heart. It provides your doctor with information about the size and shape of your heart and how well your heart's chambers and valves are working. This procedure takes approximately one hour. There are no restrictions for this procedure. Please do NOT wear cologne, perfume, aftershave, or lotions (deodorant is allowed). Please arrive 15 minutes prior to your appointment time.    Follow-Up: At [facility], you and your health needs are our priority.  As part of our continuing mission to provide you with exceptional heart care, we have created designated Provider Care Teams.  These Care Teams include your primary Cardiologist (physician) and Advanced Practice Providers (APPs -  Physician Assistants and Nurse Practitioners) who all work together to provide you with the care you need, when you need it.  We recommend signing up for the patient portal called "MyChart".  Sign up information is provided on this After Visit Summary.  MyChart is used to connect with patients for Virtual Visits (Telemedicine).  Patients are able to view lab/test results, encounter notes, upcoming appointments, etc.  Non-urgent messages can be sent to your  provider as well.   To learn more about what you can do with MyChart, go to [email or web address].    Your next appointment:   6 month(s)  The format for your next appointment:   In Person  Provider:   Jenne Campus, MD    Other Instructions NA   This visit was accompanied by Enrigue Catena.

## 2022-10-31 NOTE — Progress Notes (Signed)
Cardiology Office Note:    Date:  10/31/2022   ID:  Aaron Schmidt, DOB 07-17-1933, MRN [id number]  PCP:  Aaron Glazier, MD  Cardiologist:  Aaron Campus, MD    Referring MD: Aaron Glazier, MD   Chief Complaint  Patient presents with   CLearance 11/01/2022    Aaron Schmidt  Fusion T12-L1    History of Present Illness:    Aaron Schmidt is a 89 y.o. male with past medical history significant for essential hypertension, chronic left bundle branch block, obstructive sleep apnea on CPAP mask, parking son, in 2020 he came to hospital because of confusion he was found to have elevated troponin cardiac catheterization has been performed which showed normal coronaries, echocardiogram last time done showed moderate concentric LVH and mildly diminished ejection fraction, mild to moderate MR mild MS.  He was referred to [place] because he needed surgery tomorrow actually for his back.  Overall he is living very sedentary lifestyle.  He does have herniated disc and that is why surgery is needed.  Denies have any chest pain tightness squeezing pressure burning chest no palpitation dizziness but again his ability to exercise is very limited.  Past Medical History:  Diagnosis Date   Acute kidney injury superimposed on chronic kidney disease ([place]) 09/05/2017   Anemia 09/21/2017   Anemia due to chronic blood loss 01/10/2021   Arthritis    Degeneration spine & stenosis   Benign prostatic hyperplasia 01/10/2021   Benign prostatic hyperplasia with lower urinary tract symptoms 01/10/2021   Body mass index (BMI) 25.0-25.9, adult 08/31/2019   CAD (coronary artery disease) 04/20/2019   Cervical myelopathy with cervical radiculopathy (HCC) 06/24/2014   Cervical spondylosis without myelopathy 05/12/2014   Chronic bilateral low back pain 07/26/2020   Chronic kidney disease, stage 3 unspecified ([place]) 01/10/2021   Chronic low back pain 07/29/2013   Chronic pain 09/05/2017   Degenerative cervical spinal  stenosis 05/26/2014   Degenerative lumbar spinal stenosis 09/22/2019   Displacement of cervical intervertebral disc 07/19/2014   Edema 01/10/2021   Elevated troponin 09/05/2017   Empyema lung ([place]) 09/09/2017   Enlarged prostate 01/10/2021   Essential hypertension 08/29/2010   Qualifier: Diagnosis of  By: Aaron Soho MD, Aaron Schmidt     Fall in home 07/10/2017   Hardening of the aorta (main artery of the heart) ([place]) 01/10/2021   History of kidney stones    Hyperlipidemia 06/21/2014   Hypertension    has a histroy of   Hypoalbuminemia 01/10/2021   Hyponatremia 09/05/2017   Hypotension    Impaired fasting glucose 01/10/2021   Intermittent tremor 01/10/2021   Iron deficiency anemia 01/10/2021   Kidney stone 01/10/2021   LBBB (left bundle branch block) 06/21/2014   Lumbar post-laminectomy syndrome 07/15/2013   Lumbar radiculopathy 07/29/2013   Lumbar stenosis with neurogenic claudication 08/31/2019   Malnutrition of moderate degree 03/23/2019   Mitral valve disorder 01/10/2021   Murmur 01/08/2017   Muscle pain 03/01/2020   Myocardial infarction ([place]) 03/21/2019   Neck pain 03/03/2014   Normochromic normocytic anemia 09/05/2017   NSTEMI (non-ST elevated myocardial infarction) ([place]) 03/21/2019   Obesity 04/06/2019   Obstructive sleep apnea 09/30/2010   PSG (180 lbs ) >> severe obstructive sleep apnea with AHi 38/h & desatns to 82%  Started on auto 5-15  >> changed to 8 cm Download reviewed 1/21 - 11/23/10 >> residual events of 6/h , avg pr 8 cm, leak + with nasal pillows     Occasional  tremors 07/26/2020   Old myocardial infarction 01/10/2021   Osteoarthrosis 01/10/2021   Osteopenia 01/10/2021   Other specified disorders of kidney and ureter 01/10/2021   Parkinson's disease    Peripheral edema 01/10/2021   Peripheral neuralgia 01/10/2021   Personal history of colonic polyps 01/10/2021   Personal history of other diseases of the respiratory system 01/10/2021   Pleural effusion on left 08/19/2017   Acute symptoms since  08/13/17 > CTa  08/13/17  neg pe/ extensively loculated effusion - L thoracentesis 08/20/2017 :  220 cc with glucose < 20 and WBC 5,338 mostly Poly, LDH 688 > cyt neg >Referrd to T surgery > VATS 09/09/17 c/w empyema   Pleural effusion, left 09/04/2017   Preoperative cardiovascular examination 06/21/2014   Protein-calorie malnutrition, severe 09/11/2017   Pure hypercholesterolemia 01/10/2021   Sciatica 01/10/2021   Scoliosis concern 08/31/2019   Sensorineural hearing loss (SNHL) of both ears 06/15/2020   Last Assessment & Plan:  Formatting of this note might be different from the original. Concern over worsening hearing. Chronic history of sensorineural hearing loss bilaterally.  Has hearing aids currently.  He feels his hearing aids are not working as well as they historically have.  Recently had them checked. EXAM shows normal external canals and tympanic membranes bilaterally. AUDIOGRAM Shows B   Shoulder joint pain 12/01/2013   Sinusitis, chronic 08/19/2017   CT sinus  08/20/2017 >>> Normally aerated paranasal sinuses.  Patent sinus drainage pathways.   SIRS (systemic inflammatory response syndrome) ([place]) 09/05/2017   Skin ulcer of toe of left foot, limited to breakdown of skin ([place]) 09/04/2016   Sleep apnea 2012   used CPAP 2 yrs. ago, feels he sleeps better w/o, no longer using    Spinal stenosis of lumbar region 08/10/2013   Spinal stenosis, lumbar region without neurogenic claudication 08/19/2019   Symptomatic anemia 09/20/2017   Thrombocytopenia ([place]) 01/10/2021   Tubular adenoma of colon 01/10/2021   Unspecified abnormal finding in specimens from other organs, systems and tissues 01/10/2021    Past Surgical History:  Procedure Laterality Date   ANTERIOR LAT LUMBAR FUSION N/A 09/22/2019   Procedure: Lumbar one-two, Lumbar two-three Anterolateral lumbar interbody fusion with pedicle screw fixation and exploration of adjacent fusion;  Surgeon: Aaron Levine, MD;  Location: [facility];  Service:  Neurosurgery;  Laterality: N/A;   ARM NEUROPLASTY     at 14 yrs. of age- fell off tractor- had fracture & repair *& later- 1990's had  transplantation of a nerve at the elbow   BACK SURGERY     x4 back surgery x2 fusion -   CARDIAC CATHETERIZATION  03/23/2019   CARPAL TUNNEL RELEASE Right    COLONOSCOPY     ESOPHAGOGASTRODUODENOSCOPY (EGD) WITH PROPOFOL N/A 09/23/2017   Procedure: ESOPHAGOGASTRODUODENOSCOPY (EGD) WITH PROPOFOL;  Surgeon: Laurence Spates, MD;  Location: [facility];  Service: Endoscopy;  Laterality: N/A;   LEFT HEART CATH AND CORONARY ANGIOGRAPHY N/A 03/23/2019   Procedure: LEFT HEART CATH AND CORONARY ANGIOGRAPHY;  Surgeon: Burnell Blanks, MD;  Location: [facility];  Service: Cardiovascular;  Laterality: N/A;   LUMBAR PERCUTANEOUS PEDICLE SCREW 2 LEVEL N/A 09/22/2019   Procedure: LUMBAR PERCUTANEOUS PEDICLE SCREW TWO LEVEL;  Surgeon: Aaron Levine, MD;  Location: [facility];  Service: Neurosurgery;  Laterality: N/A;   PLEURAL EFFUSION DRAINAGE Left 09/09/2017   Procedure: DRAINAGE OF PLEURAL EFFUSION;  Surgeon: [name], MD;  Location: [facility];  Service: Thoracic;  Laterality: Left;   POSTERIOR CERVICAL FUSION/FORAMINOTOMY N/A  06/24/2014   Procedure: Posterior Cervical Three-Seven Fusion with Lateral Mass Fixation;  Surgeon: Aaron Levine, MD;  Location: [facility];  Service: Neurosurgery;  Laterality: N/A;  C3-C7 posterior cervical fusion with lateral mass fixation   SHOULDER SURGERY Bilateral    VIDEO ASSISTED THORACOSCOPY (VATS)/DECORTICATION Left 09/09/2017   Procedure: VIDEO ASSISTED THORACOSCOPY (VATS)/DECORTICATION;  Surgeon: [name], MD;  Location: [facility];  Service: Thoracic;  Laterality: Left;   VIDEO BRONCHOSCOPY  09/09/2017   Procedure: VIDEO BRONCHOSCOPY;  Surgeon: [name], MD;  Location: [facility];  Service: Thoracic;;    Current Medications: Current Meds  Medication Sig   aspirin EC 81 MG tablet Take 81 mg by  mouth daily.   carbidopa-levodopa (SINEMET IR) 25-100 MG tablet 2 tablets at 8am/2 tablets at noon/2 at 4pm (Patient taking differently: Take 1-2 tablets by mouth See admin instructions. Take 2 tablets by mouth at 0700, take 2 tablets by mouth at 1100, take 1 tablet by mouth at 1600 & take 1 tablet by at 1900.)   Carbidopa-Levodopa ER (SINEMET CR) 25-100 MG tablet controlled release Take 1 tablet by mouth at bedtime.   docusate sodium (COLACE) 100 MG capsule Take 600 mg by mouth in the morning.   finasteride (PROSCAR) 5 MG tablet Take 5 mg by mouth in the morning.   fluocinonide cream (LIDEX) 0.99 % Apply 1 application topically daily as needed (itching).   furosemide (LASIX) 20 MG tablet Take 20 mg by mouth every Monday, Wednesday, and Friday. In the morning   HYDROmorphone (DILAUDID) 2 MG tablet Take 2 tablets (4 mg total) by mouth 4 (four) times daily. (Patient taking differently: Take 2 mg by mouth every 5 (five) hours as needed for moderate pain or severe pain.)   L-LYSINE PO Take 1 capsule by mouth in the morning.   losartan (COZAAR) 25 MG tablet Take 1 tablet (25 mg total) by mouth daily. Patient must keep appointment for 10/31/22 for further refills. 3 rd/final attempt (Patient taking differently: Take 25 mg by mouth every evening. Patient must keep appointment for 10/31/22 for further refills. 3 rd/final attempt)   mineral oil liquid Take 90 mLs by mouth daily in the afternoon. 6 tablespoons in the afternoon.   Multiple Minerals-Vitamins (CAL MAG ZINC +D3 PO) Take 1 tablet by mouth in the morning.   Multiple Vitamin (MULTIVITAMIN WITH MINERALS) TABS tablet Take 1 tablet by mouth in the morning. Mature mens 50+   nortriptyline (PAMELOR) 25 MG capsule Take 25 mg by mouth at bedtime.    Polyethyl Glycol-Propyl Glycol (LUBRICANT EYE DROPS) 0.4-0.3 % SOLN Place 1-2 drops into both eyes 3 (three) times daily as needed (dry/irritated eyes.).   pregabalin (LYRICA) 150 MG capsule Take 150 mg by mouth in  the morning.   pyridOXINE (VITAMIN B-6) 100 MG tablet Take 100 mg by mouth in the morning.   Sennosides 15 MG TABS Take 45 mg by mouth in the morning. Perdiem Stimulant Laxative Tablets   simvastatin (ZOCOR) 40 MG tablet Take 40 mg by mouth at bedtime.   SUPER B COMPLEX/C PO Take 1 capsule by mouth in the morning.   tamsulosin (FLOMAX) 0.4 MG CAPS capsule Take 0.8 mg by mouth in the morning.   tiZANidine (ZANAFLEX) 2 MG tablet Take 2 mg by mouth every 8 (eight) hours.   traZODone (DESYREL) 150 MG tablet Take 150 mg by mouth at bedtime.   vitamin B-12 (CYANOCOBALAMIN) 1000 MCG tablet Take 1,000 mcg by mouth in the morning.  vitamin C (ASCORBIC ACID) 500 MG tablet Take 500 mg by mouth in the morning.   VITAMIN D PO Take 2,000 Units by mouth in the morning.     Allergies:   Baclofen, Doxazosin mesylate, and Diazepam   Social History   Socioeconomic History   Marital status: Married    Spouse name: Regino Schultze   Number of children: Not on file   Years of education: Not on file   Highest education level: Not on file  Occupational History   Occupation: Retired    Comment: Worked for [employer] and retired in [place] Use   Smoking status: Former    Types: Pipe    Quit date: 09/24/1978    Years since quitting: 44.1   Smokeless tobacco: Never  Vaping Use   Vaping Use: Never used  Substance and Sexual Activity   Alcohol use: Yes    Comment: 3 drinks/day - gin or vodka; quit 2 years ago   Drug use: No   Sexual activity: Not on file  Other Topics Concern   Not on file  Social History Narrative   Right Handed    Lives in a one story home - on the 2nd floor with elevators   Social Determinants of Health   Financial Resource Strain: Not on file  Food Insecurity: Not on file  Transportation Needs: Not on file  Physical Activity: Not on file  Stress: Not on file  Social Connections: Not on file     Family History: The patient's family history includes Leukemia in his son; Pancreatic  cancer in his mother. ROS:   Please see the history of present illness.    All 14 point review of systems negative except as described per history of present illness  EKGs/Labs/Other Studies Reviewed:      Recent Labs: No results found for requested labs within last 365 days.  Recent Lipid Panel    Component Value Date/Time   CHOL 139 03/22/2019 1200   TRIG 72 03/22/2019 1200   HDL 66 03/22/2019 1200   CHOLHDL 2.1 03/22/2019 1200   VLDL 14 03/22/2019 1200   LDLCALC 59 03/22/2019 1200    Physical Exam:    VS:  BP (!) 102/50 (BP Location: Left Arm, Patient Position: Sitting)   Pulse 76   Ht '5\' 10"'$  (1.778 m)   Wt 150 lb (68 kg)   SpO2 96%   BMI 21.52 kg/m     Wt Readings from Last 3 Encounters:  10/31/22 150 lb (68 kg)  10/24/22 158 lb 11.2 oz (72 kg)  09/03/22 155 lb (70.3 kg)     GEN:  Well nourished, well developed in no acute distress HEENT: Normal NECK: No JVD; No carotid bruits LYMPHATICS: No lymphadenopathy CARDIAC: RRR, no murmurs, no rubs, no gallops RESPIRATORY:  Clear to auscultation without rales, wheezing or rhonchi  ABDOMEN: Soft, non-tender, non-distended MUSCULOSKELETAL:  No edema; No deformity  SKIN: Warm and dry LOWER EXTREMITIES: no swelling NEUROLOGIC:  Alert and oriented x 3 PSYCHIATRIC:  Normal affect   ASSESSMENT:    1. Dyspnea on exertion   2. LBBB (left bundle branch block)   3. Nonrheumatic mitral valve regurgitation   4. Essential hypertension   5. Obstructive sleep apnea    PLAN:    In order of problems listed above:  Dyspnea on exertion does not have any but does not do anything.  Cardiovascular preop evaluation will require to repeat his echocardiogram which we will do in about half an  hour.  If echocardiogram will not show any gross abnormality and then should be fine from cardiac point of view to proceed with surgery. Left bundle branch block which is chronic finding.  Unchanged, will continue monitoring. Nonrheumatic  mitral valve regurgitation again echocardiogram to be done to reassess this lesion. Obstructive sleep apnea that being addressed by internal medicine team. Dyslipidemia I did review his K PN which show me his LDL of 53 HDL 73.  Good cholesterol control continue present management. Cardiovascular preop evaluation, he will have echocardiogram to today if echocardiogram show no gross abnormality should be acceptable candidate to proceed with surgery as scheduled tomorrow.  I reviewed his echocardiogram, left ventricular ejection fraction is preserved, there is myxomatous mitral valve with possibly flail posterior leaflet of the mitral valve however regurgitation is unchanged as compared to prior echocardiogram with mild to moderate which is directed anteriorly, left atrium is significantly enlarged.  I do not see any evidence of significant mitral stenosis.  Therefore taking all decision on the consideration he should be a reasonable candidate for surgery as scheduled.  Will need to pay special attention to fluid status.  He need to be meticulously monitored from hemodynamic point of view.  Will need to avoid excessive fluids.  Medication Adjustments/Labs and Tests Ordered: Current medicines are reviewed at length with the patient today.  Concerns regarding medicines are outlined above.  Orders Placed This Encounter  Procedures   EKG 12-Lead   ECHOCARDIOGRAM COMPLETE   Medication changes: No orders of the defined types were placed in this encounter.   Signed, Park Liter, MD, [facility] 10/31/2022 12:05 PM    [place]

## 2022-11-01 ENCOUNTER — Encounter (HOSPITAL_COMMUNITY): Payer: Self-pay | Admitting: Neurological Surgery

## 2022-11-01 ENCOUNTER — Inpatient Hospital Stay (HOSPITAL_COMMUNITY): Payer: Medicare Other

## 2022-11-01 ENCOUNTER — Inpatient Hospital Stay (HOSPITAL_COMMUNITY): Payer: Medicare Other | Admitting: Registered Nurse

## 2022-11-01 ENCOUNTER — Encounter (HOSPITAL_COMMUNITY)
Admission: RE | Disposition: A | Discharge status: Skilled Nursing Facility | Payer: Self-pay | Source: Home / Self Care | Attending: Neurological Surgery

## 2022-11-01 ENCOUNTER — Inpatient Hospital Stay (HOSPITAL_COMMUNITY)
Admission: RE | Admit: 2022-11-01 | Discharge: 2022-11-07 | DRG: 460 | Disposition: A | Discharge status: Skilled Nursing Facility | Payer: Medicare Other | Attending: Neurological Surgery | Admitting: Neurological Surgery

## 2022-11-01 ENCOUNTER — Other Ambulatory Visit: Payer: Self-pay

## 2022-11-01 ENCOUNTER — Inpatient Hospital Stay (HOSPITAL_COMMUNITY): Payer: Medicare Other | Admitting: Vascular Surgery

## 2022-11-01 DIAGNOSIS — Z806 Family history of leukemia: Secondary | ICD-10-CM | POA: Diagnosis not present

## 2022-11-01 DIAGNOSIS — I252 Old myocardial infarction: Secondary | ICD-10-CM

## 2022-11-01 DIAGNOSIS — M4804 Spinal stenosis, thoracic region: Secondary | ICD-10-CM | POA: Diagnosis not present

## 2022-11-01 DIAGNOSIS — M5105 Intervertebral disc disorders with myelopathy, thoracolumbar region: Secondary | ICD-10-CM | POA: Diagnosis present

## 2022-11-01 DIAGNOSIS — I10 Essential (primary) hypertension: Secondary | ICD-10-CM | POA: Diagnosis not present

## 2022-11-01 DIAGNOSIS — I959 Hypotension, unspecified: Secondary | ICD-10-CM | POA: Diagnosis not present

## 2022-11-01 DIAGNOSIS — M5106 Intervertebral disc disorders with myelopathy, lumbar region: Secondary | ICD-10-CM | POA: Diagnosis present

## 2022-11-01 DIAGNOSIS — E7800 Pure hypercholesterolemia, unspecified: Secondary | ICD-10-CM | POA: Diagnosis present

## 2022-11-01 DIAGNOSIS — Z87891 Personal history of nicotine dependence: Secondary | ICD-10-CM

## 2022-11-01 DIAGNOSIS — M4714 Other spondylosis with myelopathy, thoracic region: Secondary | ICD-10-CM | POA: Diagnosis present

## 2022-11-01 DIAGNOSIS — G20A1 Parkinson's disease without dyskinesia, without mention of fluctuations: Secondary | ICD-10-CM | POA: Diagnosis present

## 2022-11-01 DIAGNOSIS — Z981 Arthrodesis status: Secondary | ICD-10-CM

## 2022-11-01 DIAGNOSIS — N401 Enlarged prostate with lower urinary tract symptoms: Secondary | ICD-10-CM | POA: Diagnosis present

## 2022-11-01 DIAGNOSIS — Z800 Family history of malignant neoplasm of digestive organs: Secondary | ICD-10-CM

## 2022-11-01 DIAGNOSIS — Z974 Presence of external hearing-aid: Secondary | ICD-10-CM | POA: Diagnosis not present

## 2022-11-01 DIAGNOSIS — H903 Sensorineural hearing loss, bilateral: Secondary | ICD-10-CM | POA: Diagnosis present

## 2022-11-01 DIAGNOSIS — I129 Hypertensive chronic kidney disease with stage 1 through stage 4 chronic kidney disease, or unspecified chronic kidney disease: Secondary | ICD-10-CM | POA: Diagnosis present

## 2022-11-01 DIAGNOSIS — Z79899 Other long term (current) drug therapy: Secondary | ICD-10-CM | POA: Diagnosis not present

## 2022-11-01 DIAGNOSIS — I2510 Atherosclerotic heart disease of native coronary artery without angina pectoris: Secondary | ICD-10-CM | POA: Diagnosis present

## 2022-11-01 DIAGNOSIS — M4805 Spinal stenosis, thoracolumbar region: Secondary | ICD-10-CM | POA: Diagnosis present

## 2022-11-01 DIAGNOSIS — G4733 Obstructive sleep apnea (adult) (pediatric): Secondary | ICD-10-CM | POA: Diagnosis present

## 2022-11-01 DIAGNOSIS — Z888 Allergy status to other drugs, medicaments and biological substances status: Secondary | ICD-10-CM | POA: Diagnosis not present

## 2022-11-01 DIAGNOSIS — G8929 Other chronic pain: Secondary | ICD-10-CM | POA: Diagnosis present

## 2022-11-01 DIAGNOSIS — I739 Peripheral vascular disease, unspecified: Secondary | ICD-10-CM | POA: Diagnosis present

## 2022-11-01 HISTORY — PX: BACK SURGERY: SHX140

## 2022-11-01 SURGERY — THORACIC FUSION 1 LEVEL
Anesthesia: General

## 2022-11-01 MED ORDER — ONDANSETRON HCL 4 MG/2ML IJ SOLN
INTRAMUSCULAR | Status: AC
  Filled 2022-11-01: qty 2

## 2022-11-01 MED ORDER — CHLORHEXIDINE GLUCONATE CLOTH 2 % EX PADS: 6.0000 | MEDICATED_PAD | Freq: Once | CUTANEOUS | Status: DC

## 2022-11-01 MED ORDER — FENTANYL CITRATE (PF) 250 MCG/5ML IJ SOLN
INTRAMUSCULAR | Status: DC | PRN
  Administered 2022-11-01 (×2): 50 ug via INTRAVENOUS
  Administered 2022-11-01: 100 ug via INTRAVENOUS

## 2022-11-01 MED ORDER — BACITRACIN ZINC 500 UNIT/GM EX OINT
TOPICAL_OINTMENT | CUTANEOUS | Status: AC
  Filled 2022-11-01: qty 28.35

## 2022-11-01 MED ORDER — MENTHOL 3 MG MT LOZG: 1.0000 | LOZENGE | OROMUCOSAL | Status: DC | PRN

## 2022-11-01 MED ORDER — PHENYLEPHRINE 80 MCG/ML (10ML) SYRINGE FOR IV PUSH (FOR BLOOD PRESSURE SUPPORT)
PREFILLED_SYRINGE | INTRAVENOUS | Status: AC
  Filled 2022-11-01: qty 10

## 2022-11-01 MED ORDER — ONDANSETRON HCL 4 MG PO TABS: 4.0000 mg | ORAL_TABLET | Freq: Four times a day (QID) | ORAL | Status: DC | PRN

## 2022-11-01 MED ORDER — THROMBIN 20000 UNITS EX SOLR
CUTANEOUS | Status: AC
  Filled 2022-11-01: qty 20000

## 2022-11-01 MED ORDER — METHOCARBAMOL 1000 MG/10ML IJ SOLN: 500.0000 mg | Freq: Four times a day (QID) | INTRAVENOUS | Status: DC | PRN

## 2022-11-01 MED ORDER — CARBIDOPA-LEVODOPA 25-100 MG PO TABS
1.0000 | ORAL_TABLET | ORAL | Status: DC
  Administered 2022-11-01 – 2022-11-06 (×12): 1 via ORAL
  Filled 2022-11-01 (×14): qty 1

## 2022-11-01 MED ORDER — TRAZODONE HCL 50 MG PO TABS
150.0000 mg | ORAL_TABLET | Freq: Every day | ORAL | Status: DC
  Administered 2022-11-01 – 2022-11-06 (×6): 150 mg via ORAL
  Filled 2022-11-01 (×6): qty 1

## 2022-11-01 MED ORDER — PHENYLEPHRINE 80 MCG/ML (10ML) SYRINGE FOR IV PUSH (FOR BLOOD PRESSURE SUPPORT)
PREFILLED_SYRINGE | INTRAVENOUS | Status: DC | PRN
  Administered 2022-11-01: 160 ug via INTRAVENOUS
  Administered 2022-11-01: 80 ug via INTRAVENOUS
  Administered 2022-11-01 (×4): 160 ug via INTRAVENOUS

## 2022-11-01 MED ORDER — LIDOCAINE 2% (20 MG/ML) 5 ML SYRINGE
INTRAMUSCULAR | Status: AC
  Filled 2022-11-01: qty 5

## 2022-11-01 MED ORDER — SIMVASTATIN 20 MG PO TABS
40.0000 mg | ORAL_TABLET | Freq: Every day | ORAL | Status: DC
  Administered 2022-11-01 – 2022-11-06 (×6): 40 mg via ORAL
  Filled 2022-11-01 (×6): qty 2

## 2022-11-01 MED ORDER — SUCCINYLCHOLINE CHLORIDE 200 MG/10ML IV SOSY
PREFILLED_SYRINGE | INTRAVENOUS | Status: DC | PRN
  Administered 2022-11-01: 120 mg via INTRAVENOUS

## 2022-11-01 MED ORDER — ONDANSETRON HCL 4 MG/2ML IJ SOLN: 4.0000 mg | Freq: Once | INTRAMUSCULAR | Status: DC | PRN

## 2022-11-01 MED ORDER — OXYCODONE HCL 5 MG PO TABS
10.0000 mg | ORAL_TABLET | ORAL | Status: DC | PRN
  Administered 2022-11-01 – 2022-11-04 (×6): 10 mg via ORAL
  Filled 2022-11-01 (×6): qty 2

## 2022-11-01 MED ORDER — NORTRIPTYLINE HCL 25 MG PO CAPS
25.0000 mg | ORAL_CAPSULE | Freq: Every day | ORAL | Status: DC
  Administered 2022-11-01 – 2022-11-06 (×6): 25 mg via ORAL
  Filled 2022-11-01 (×7): qty 1

## 2022-11-01 MED ORDER — ALBUMIN HUMAN 5 % IV SOLN: INTRAVENOUS | Status: DC | PRN

## 2022-11-01 MED ORDER — 0.9 % SODIUM CHLORIDE (POUR BTL) OPTIME
TOPICAL | Status: DC | PRN
  Administered 2022-11-01: 1000 mL

## 2022-11-01 MED ORDER — DOCUSATE SODIUM 100 MG PO CAPS
100.0000 mg | ORAL_CAPSULE | Freq: Two times a day (BID) | ORAL | Status: DC
  Administered 2022-11-01 – 2022-11-07 (×12): 100 mg via ORAL
  Filled 2022-11-01 (×13): qty 1

## 2022-11-01 MED ORDER — CEFAZOLIN SODIUM-DEXTROSE 2-4 GM/100ML-% IV SOLN
2.0000 g | Freq: Three times a day (TID) | INTRAVENOUS | Status: AC
  Administered 2022-11-01 (×2): 2 g via INTRAVENOUS
  Filled 2022-11-01 (×2): qty 100

## 2022-11-01 MED ORDER — PHENYLEPHRINE HCL-NACL 20-0.9 MG/250ML-% IV SOLN
INTRAVENOUS | Status: DC | PRN
  Administered 2022-11-01: 35 ug/min via INTRAVENOUS

## 2022-11-01 MED ORDER — EPHEDRINE 5 MG/ML INJ
INTRAVENOUS | Status: AC
  Filled 2022-11-01: qty 5

## 2022-11-01 MED ORDER — HYDROMORPHONE HCL 1 MG/ML IJ SOLN
INTRAMUSCULAR | Status: AC
  Filled 2022-11-01: qty 1

## 2022-11-01 MED ORDER — TIZANIDINE HCL 4 MG PO TABS
2.0000 mg | ORAL_TABLET | Freq: Three times a day (TID) | ORAL | Status: DC
  Administered 2022-11-01 – 2022-11-07 (×16): 2 mg via ORAL
  Filled 2022-11-01 (×18): qty 1

## 2022-11-01 MED ORDER — SUGAMMADEX SODIUM 200 MG/2ML IV SOLN
INTRAVENOUS | Status: DC | PRN
  Administered 2022-11-01: 300 mg via INTRAVENOUS

## 2022-11-01 MED ORDER — PROPOFOL 10 MG/ML IV BOLUS
INTRAVENOUS | Status: AC
  Filled 2022-11-01: qty 20

## 2022-11-01 MED ORDER — BUPIVACAINE LIPOSOME 1.3 % IJ SUSP
INTRAMUSCULAR | Status: AC
  Filled 2022-11-01: qty 20

## 2022-11-01 MED ORDER — ONDANSETRON HCL 4 MG/2ML IJ SOLN
4.0000 mg | Freq: Four times a day (QID) | INTRAMUSCULAR | Status: DC | PRN
  Administered 2022-11-06: 4 mg via INTRAVENOUS
  Filled 2022-11-01: qty 2

## 2022-11-01 MED ORDER — TAMSULOSIN HCL 0.4 MG PO CAPS
0.8000 mg | ORAL_CAPSULE | Freq: Every morning | ORAL | Status: DC
  Administered 2022-11-02 – 2022-11-07 (×6): 0.8 mg via ORAL
  Filled 2022-11-01 (×6): qty 2

## 2022-11-01 MED ORDER — LOSARTAN POTASSIUM 50 MG PO TABS
25.0000 mg | ORAL_TABLET | Freq: Every day | ORAL | Status: DC
  Administered 2022-11-01 – 2022-11-03 (×3): 25 mg via ORAL
  Filled 2022-11-01 (×3): qty 1

## 2022-11-01 MED ORDER — SODIUM CHLORIDE 0.9 % IV SOLN
250.0000 mL | INTRAVENOUS | Status: DC
  Administered 2022-11-01: 250 mL via INTRAVENOUS

## 2022-11-01 MED ORDER — CARBIDOPA-LEVODOPA 25-100 MG PO TABS
2.0000 | ORAL_TABLET | ORAL | Status: DC
  Administered 2022-11-02 – 2022-11-07 (×12): 2 via ORAL
  Filled 2022-11-01 (×12): qty 2

## 2022-11-01 MED ORDER — HYDROMORPHONE HCL 1 MG/ML IJ SOLN
0.5000 mg | INTRAMUSCULAR | Status: DC | PRN
  Administered 2022-11-02 (×3): 0.5 mg via INTRAVENOUS
  Filled 2022-11-01 (×3): qty 0.5

## 2022-11-01 MED ORDER — DEXAMETHASONE SODIUM PHOSPHATE 10 MG/ML IJ SOLN
INTRAMUSCULAR | Status: AC
  Filled 2022-11-01: qty 1

## 2022-11-01 MED ORDER — CHLORHEXIDINE GLUCONATE 0.12 % MT SOLN
OROMUCOSAL | Status: AC
  Administered 2022-11-01: 15 mL via OROMUCOSAL
  Filled 2022-11-01: qty 15

## 2022-11-01 MED ORDER — LIDOCAINE-EPINEPHRINE 1 %-1:100000 IJ SOLN
INTRAMUSCULAR | Status: DC | PRN
  Administered 2022-11-01: 10 mL

## 2022-11-01 MED ORDER — ROCURONIUM BROMIDE 10 MG/ML (PF) SYRINGE
PREFILLED_SYRINGE | INTRAVENOUS | Status: DC | PRN
  Administered 2022-11-01 (×3): 20 mg via INTRAVENOUS
  Administered 2022-11-01: 30 mg via INTRAVENOUS

## 2022-11-01 MED ORDER — ORAL CARE MOUTH RINSE: 15.0000 mL | Freq: Once | OROMUCOSAL | Status: AC

## 2022-11-01 MED ORDER — VASOPRESSIN 20 UNIT/ML IV SOLN
INTRAVENOUS | Status: AC
  Filled 2022-11-01: qty 1

## 2022-11-01 MED ORDER — FUROSEMIDE 20 MG PO TABS
20.0000 mg | ORAL_TABLET | ORAL | Status: DC
  Administered 2022-11-02 – 2022-11-07 (×3): 20 mg via ORAL
  Filled 2022-11-01 (×3): qty 1

## 2022-11-01 MED ORDER — DEXAMETHASONE SODIUM PHOSPHATE 10 MG/ML IJ SOLN
INTRAMUSCULAR | Status: DC | PRN
  Administered 2022-11-01: 10 mg via INTRAVENOUS

## 2022-11-01 MED ORDER — PREGABALIN 75 MG PO CAPS
150.0000 mg | ORAL_CAPSULE | Freq: Every morning | ORAL | Status: DC
  Administered 2022-11-02 – 2022-11-07 (×6): 150 mg via ORAL
  Filled 2022-11-01 (×6): qty 2

## 2022-11-01 MED ORDER — BUPIVACAINE-EPINEPHRINE (PF) 0.25% -1:200000 IJ SOLN
INTRAMUSCULAR | Status: AC
  Filled 2022-11-01: qty 30

## 2022-11-01 MED ORDER — FENTANYL CITRATE (PF) 250 MCG/5ML IJ SOLN
INTRAMUSCULAR | Status: AC
  Filled 2022-11-01: qty 5

## 2022-11-01 MED ORDER — THROMBIN 5000 UNITS EX SOLR
OROMUCOSAL | Status: DC | PRN
  Administered 2022-11-01: 5 mL via TOPICAL

## 2022-11-01 MED ORDER — HYDROMORPHONE HCL 2 MG PO TABS
2.0000 mg | ORAL_TABLET | ORAL | Status: DC | PRN
  Administered 2022-11-01 – 2022-11-05 (×2): 2 mg via ORAL
  Filled 2022-11-01 (×2): qty 1

## 2022-11-01 MED ORDER — PROPOFOL 10 MG/ML IV BOLUS
INTRAVENOUS | Status: DC | PRN
  Administered 2022-11-01: 25 ug/kg/min via INTRAVENOUS
  Administered 2022-11-01: 100 mg via INTRAVENOUS

## 2022-11-01 MED ORDER — METHOCARBAMOL 500 MG PO TABS
500.0000 mg | ORAL_TABLET | Freq: Four times a day (QID) | ORAL | Status: DC | PRN
  Administered 2022-11-01 – 2022-11-07 (×7): 500 mg via ORAL
  Filled 2022-11-01 (×7): qty 1

## 2022-11-01 MED ORDER — ROCURONIUM BROMIDE 10 MG/ML (PF) SYRINGE
PREFILLED_SYRINGE | INTRAVENOUS | Status: AC
  Filled 2022-11-01: qty 10

## 2022-11-01 MED ORDER — LIDOCAINE-EPINEPHRINE 1 %-1:100000 IJ SOLN
INTRAMUSCULAR | Status: AC
  Filled 2022-11-01: qty 1

## 2022-11-01 MED ORDER — SODIUM CHLORIDE 0.9 % IV SOLN: INTRAVENOUS | Status: DC | PRN

## 2022-11-01 MED ORDER — CEFAZOLIN SODIUM-DEXTROSE 2-4 GM/100ML-% IV SOLN
INTRAVENOUS | Status: AC
  Filled 2022-11-01: qty 100

## 2022-11-01 MED ORDER — THROMBIN 20000 UNITS EX SOLR
CUTANEOUS | Status: DC | PRN
  Administered 2022-11-01: 20 mL via TOPICAL

## 2022-11-01 MED ORDER — ACETAMINOPHEN 650 MG RE SUPP: 650.0000 mg | RECTAL | Status: DC | PRN

## 2022-11-01 MED ORDER — ACETAMINOPHEN 325 MG PO TABS
650.0000 mg | ORAL_TABLET | ORAL | Status: DC | PRN
  Administered 2022-11-01 – 2022-11-07 (×10): 650 mg via ORAL
  Filled 2022-11-01 (×10): qty 2

## 2022-11-01 MED ORDER — OXYCODONE HCL 5 MG PO TABS
5.0000 mg | ORAL_TABLET | ORAL | Status: DC | PRN
  Administered 2022-11-01 – 2022-11-07 (×9): 5 mg via ORAL
  Filled 2022-11-01 (×11): qty 1

## 2022-11-01 MED ORDER — FINASTERIDE 5 MG PO TABS
5.0000 mg | ORAL_TABLET | Freq: Every morning | ORAL | Status: DC
  Administered 2022-11-02 – 2022-11-07 (×6): 5 mg via ORAL
  Filled 2022-11-01 (×7): qty 1

## 2022-11-01 MED ORDER — MIDAZOLAM HCL 2 MG/2ML IJ SOLN
INTRAMUSCULAR | Status: AC
  Filled 2022-11-01: qty 2

## 2022-11-01 MED ORDER — HYDROMORPHONE HCL 1 MG/ML IJ SOLN
0.2500 mg | INTRAMUSCULAR | Status: DC | PRN
  Administered 2022-11-01 (×2): 0.25 mg via INTRAVENOUS

## 2022-11-01 MED ORDER — CARBIDOPA-LEVODOPA ER 25-100 MG PO TBCR
1.0000 | EXTENDED_RELEASE_TABLET | Freq: Every day | ORAL | Status: DC
  Administered 2022-11-01 – 2022-11-05 (×5): 1 via ORAL
  Filled 2022-11-01 (×6): qty 1

## 2022-11-01 MED ORDER — CHLORHEXIDINE GLUCONATE 0.12 % MT SOLN: 15.0000 mL | Freq: Once | OROMUCOSAL | Status: AC

## 2022-11-01 MED ORDER — CEFAZOLIN SODIUM-DEXTROSE 2-4 GM/100ML-% IV SOLN
2.0000 g | INTRAVENOUS | Status: AC
  Administered 2022-11-01: 2 g via INTRAVENOUS

## 2022-11-01 MED ORDER — LACTATED RINGERS IV SOLN: INTRAVENOUS | Status: DC

## 2022-11-01 MED ORDER — ONDANSETRON HCL 4 MG/2ML IJ SOLN
INTRAMUSCULAR | Status: DC | PRN
  Administered 2022-11-01: 4 mg via INTRAVENOUS

## 2022-11-01 MED ORDER — SUCCINYLCHOLINE CHLORIDE 200 MG/10ML IV SOSY
PREFILLED_SYRINGE | INTRAVENOUS | Status: AC
  Filled 2022-11-01: qty 10

## 2022-11-01 MED ORDER — BUPIVACAINE LIPOSOME 1.3 % IJ SUSP
INTRAMUSCULAR | Status: DC | PRN
  Administered 2022-11-01: 20 mL

## 2022-11-01 MED ORDER — PHENOL 1.4 % MT LIQD: 1.0000 | OROMUCOSAL | Status: DC | PRN

## 2022-11-01 MED ORDER — THROMBIN 5000 UNITS EX SOLR
CUTANEOUS | Status: AC
  Filled 2022-11-01: qty 5000

## 2022-11-01 MED ORDER — LIDOCAINE 2% (20 MG/ML) 5 ML SYRINGE
INTRAMUSCULAR | Status: DC | PRN
  Administered 2022-11-01: 100 mg via INTRAVENOUS

## 2022-11-01 MED ORDER — SODIUM CHLORIDE 0.9% FLUSH: 3.0000 mL | INTRAVENOUS | Status: DC | PRN

## 2022-11-01 MED ORDER — SODIUM CHLORIDE 0.9% FLUSH
3.0000 mL | Freq: Two times a day (BID) | INTRAVENOUS | Status: DC
  Administered 2022-11-01 – 2022-11-06 (×9): 3 mL via INTRAVENOUS

## 2022-11-01 MED ORDER — EPHEDRINE SULFATE-NACL 50-0.9 MG/10ML-% IV SOSY
PREFILLED_SYRINGE | INTRAVENOUS | Status: DC | PRN
  Administered 2022-11-01: 5 mg via INTRAVENOUS
  Administered 2022-11-01: 10 mg via INTRAVENOUS
  Administered 2022-11-01 (×2): 5 mg via INTRAVENOUS

## 2022-11-01 SURGICAL SUPPLY — 74 items
ADH SKN CLS APL DERMABOND .7 (GAUZE/BANDAGES/DRESSINGS) ×1
BAG BANDED W/RUBBER/TAPE 36X54 (MISCELLANEOUS) ×2 IMPLANT
BAG COUNTER SPONGE SURGICOUNT (BAG) ×1 IMPLANT
BAG EQP BAND 135X91 W/RBR TAPE (MISCELLANEOUS)
BAG SPNG CNTER NS LX DISP (BAG) ×2
BAND INSRT 18 STRL LF DISP RB (MISCELLANEOUS)
BAND RUBBER #18 3X1/16 STRL (MISCELLANEOUS) IMPLANT
BASKET BONE COLLECTION (BASKET) IMPLANT
BUR CARBIDE MATCH 3.0 (BURR) ×1 IMPLANT
CNTNR URN SCR LID CUP LEK RST (MISCELLANEOUS) ×1 IMPLANT
CONT SPEC 4OZ STRL OR WHT (MISCELLANEOUS) ×1
COVER BACK TABLE 60X90IN (DRAPES) ×1 IMPLANT
DERMABOND ADVANCED .7 DNX12 (GAUZE/BANDAGES/DRESSINGS) ×1 IMPLANT
DRAIN JACKSON RD 7FR 3/32 (WOUND CARE) IMPLANT
DRAPE C-ARM 42X72 X-RAY (DRAPES) ×1 IMPLANT
DRAPE LAPAROTOMY 100X72X124 (DRAPES) ×1 IMPLANT
DRAPE MICROSCOPE SLANT 54X150 (MISCELLANEOUS) IMPLANT
DRAPE SURG 17X23 STRL (DRAPES) ×1 IMPLANT
DRSG OPSITE 4X5.5 SM (GAUZE/BANDAGES/DRESSINGS) ×1 IMPLANT
DRSG OPSITE POSTOP 4X6 (GAUZE/BANDAGES/DRESSINGS) IMPLANT
DRSG TELFA 3X8 NADH STRL (GAUZE/BANDAGES/DRESSINGS) IMPLANT
DURAPREP 26ML APPLICATOR (WOUND CARE) ×1 IMPLANT
ELECT BLADE INSULATED 4IN (ELECTROSURGICAL) ×1
ELECT REM PT RETURN 9FT ADLT (ELECTROSURGICAL) ×1
ELECTRODE BLADE INSULATED 4IN (ELECTROSURGICAL) ×1 IMPLANT
ELECTRODE REM PT RTRN 9FT ADLT (ELECTROSURGICAL) ×1 IMPLANT
EVACUATOR 1/8 PVC DRAIN (DRAIN) IMPLANT
GAUZE 4X4 16PLY ~~LOC~~+RFID DBL (SPONGE) IMPLANT
GAUZE PAD ABD 8X10 STRL (GAUZE/BANDAGES/DRESSINGS) ×1 IMPLANT
GAUZE SPONGE 4X4 12PLY STRL (GAUZE/BANDAGES/DRESSINGS) ×1 IMPLANT
GLOVE BIOGEL PI IND STRL 7.5 (GLOVE) IMPLANT
GLOVE BIOGEL PI IND STRL 8 (GLOVE) ×2 IMPLANT
GLOVE ECLIPSE 8.0 STRL XLNG CF (GLOVE) ×2 IMPLANT
GLOVE SURG ENC MOIS LTX SZ8 (GLOVE) ×1 IMPLANT
GLOVE SURG SS PI 6.5 STRL IVOR (GLOVE) IMPLANT
GLOVE SURG UNDER POLY LF SZ8.5 (GLOVE) ×1 IMPLANT
GOWN STRL REUS W/ TWL LRG LVL3 (GOWN DISPOSABLE) IMPLANT
GOWN STRL REUS W/ TWL XL LVL3 (GOWN DISPOSABLE) ×2 IMPLANT
GOWN STRL REUS W/TWL 2XL LVL3 (GOWN DISPOSABLE) IMPLANT
GOWN STRL REUS W/TWL LRG LVL3 (GOWN DISPOSABLE) ×1
GOWN STRL REUS W/TWL XL LVL3 (GOWN DISPOSABLE) ×3
KIT BASIN OR (CUSTOM PROCEDURE TRAY) ×1 IMPLANT
KIT POSITION SURG JACKSON T1 (MISCELLANEOUS) ×1 IMPLANT
KIT TURNOVER KIT B (KITS) ×1 IMPLANT
MARKER SKIN DUAL TIP RULER LAB (MISCELLANEOUS) ×1 IMPLANT
MILL BONE PREP (MISCELLANEOUS) IMPLANT
NDL HYPO 21X1.5 ECLIPSE (NEEDLE) ×1 IMPLANT
NDL HYPO 25X1 1.5 SAFETY (NEEDLE) ×1 IMPLANT
NEEDLE HYPO 21X1.5 ECLIPSE (NEEDLE) ×1 IMPLANT
NEEDLE HYPO 25X1 1.5 SAFETY (NEEDLE) ×1 IMPLANT
NS IRRIG 1000ML POUR BTL (IV SOLUTION) ×1 IMPLANT
PACK LAMINECTOMY NEURO (CUSTOM PROCEDURE TRAY) ×1 IMPLANT
PAD ARMBOARD 7.5X6 YLW CONV (MISCELLANEOUS) ×3 IMPLANT
PATTIES SURGICAL .5 X.5 (GAUZE/BANDAGES/DRESSINGS) IMPLANT
PATTIES SURGICAL .5 X1 (DISPOSABLE) IMPLANT
PATTIES SURGICAL 1X1 (DISPOSABLE) IMPLANT
ROD RELINE 0-0 CON M 5.0/6.0MM (Rod) IMPLANT
ROD RELINE TI LORD 5.5X70 (Rod) IMPLANT
ROD RELINE-O LORDOTIC 5.5X60MM (Rod) IMPLANT
SCREW LOCK RELINE 5.5 TULIP (Screw) IMPLANT
SCREW PA RELINE 7.5X50 (Screw) IMPLANT
SPIKE FLUID TRANSFER (MISCELLANEOUS) ×1 IMPLANT
SPONGE SURGIFOAM ABS GEL 100 (HEMOSTASIS) ×1 IMPLANT
SPONGE T-LAP 4X18 ~~LOC~~+RFID (SPONGE) IMPLANT
STAPLER VISISTAT 35W (STAPLE) ×1 IMPLANT
SUT VIC AB 0 CT1 18XCR BRD8 (SUTURE) ×1 IMPLANT
SUT VIC AB 0 CT1 8-18 (SUTURE) ×1
SUT VIC AB 2-0 CP2 18 (SUTURE) ×1 IMPLANT
SUT VIC AB 3-0 SH 8-18 (SUTURE) ×1 IMPLANT
SYR 20ML LL LF (SYRINGE) ×1 IMPLANT
TOWEL GREEN STERILE (TOWEL DISPOSABLE) IMPLANT
TOWEL GREEN STERILE FF (TOWEL DISPOSABLE) IMPLANT
TRAY FOLEY MTR SLVR 16FR STAT (SET/KITS/TRAYS/PACK) ×1 IMPLANT
WATER STERILE IRR 1000ML POUR (IV SOLUTION) ×1 IMPLANT

## 2022-11-01 NOTE — Anesthesia Procedure Notes (Signed)
Procedure Name: Intubation Date/Time: 11/01/2022 7:47 AM  Performed by: Ester Rink, CRNAPre-anesthesia Checklist: Patient identified, Emergency Drugs available, Suction available and Patient being monitored Patient Re-evaluated:Patient Re-evaluated prior to induction Oxygen Delivery Method: Circle system utilized Preoxygenation: Pre-oxygenation with 100% oxygen Induction Type: IV induction Ventilation: Mask ventilation without difficulty Laryngoscope Size: Glidescope and 4 Grade View: Grade I Tube type: Oral Tube size: 7.5 mm Number of attempts: 1 Airway Equipment and Method: Oral airway and Rigid stylet Placement Confirmation: ETT inserted through vocal cords under direct vision, positive ETCO2 and breath sounds checked- equal and bilateral Secured at: 23 cm Tube secured with: Tape Dental Injury: Teeth and Oropharynx as per pre-operative assessment

## 2022-11-01 NOTE — Op Note (Signed)
Providing Compassionate, Quality Care - Together  Date of service: 11/01/2022  PREOP DIAGNOSIS:  T12-L1 large herniated nucleus pulposus with severe canal stenosis  POSTOP DIAGNOSIS: Same  PROCEDURE: T12-L1 posterior lateral instrumentation and fusion Placement of bilateral T12 pedicle screws, NuVasive 7.5 x 50 mm bilaterally Exploration of fusion L1-L3 T12-L1 bilateral laminectomy, with right T12-L1 facetectomy for decompression of spinal cord and T12 nerve root, with T12-L1 herniated Discectomy Intraoperative use of autograft, same incision Intraoperative use of microscope dissection  SURGEON: Dr. Pieter Partridge C. Latesa Fratto, DO  ASSISTANT: None  ANESTHESIA: General Endotracheal  EBL: 150 cc  SPECIMENS: None  DRAINS: HMV  COMPLICATIONS: None  CONDITION: hemodynamically stable  HISTORY: RONDALE Schmidt is a 89 y.o. male with a history of L1-S1 fusion, with progressive complaints of severe worsening low back pain, with lower extremity claudication.  He progressed to then requiring a wheelchair in order for mobilization and had diffuse weakness in his lower extremities.  MRI of the lumbar spine revealed a very large T12-L1 rightward disc protrusion with superior migration causing severe canal stenosis.  Given this, I recommended surgical intervention in the form of a T12-L1 decompression, facetectomy with extension of fusion to T12.  We discussed all risks, benefits and expected outcomes.  Informed consent was obtained and witnessed.  He did obtain cardiac clearance.  PROCEDURE IN DETAIL: The patient was brought to the operating room. After induction of general anesthesia, the patient was positioned on the operative table in the prone position. All pressure points were meticulously padded. Skin incision was then marked out and prepped and draped in the usual sterile fashion. Physician driven timeout was performed.  Using 10 blade, midline incision was carried over the T12-L2 spinous  processes.  Bovie electrocautery was used to expose the T12 lamina transverse processes, T12-L1 facet joints via subperiosteal dissection bilaterally.  The L1-L2 pedicle screws and rods were exposed.  The L1-L2 region appeared fused.  The rods between the L1 and L2 pedicle screws were cleared of soft tissue with Bovie electrocautery and Le[place]ell.  We then sterilely draped the microscope and brought into the field for the remainder the procedure for microdissection.  A full bilateral laminectomy was performed with a high-speed drill of T12, with a full right facetectomy at T12-L1 to the ligamentum flavum.  Autograft was saved for later use.  The ligamentum flavum was identified and gently elevated from the epidural space and removed in totality with Kerrison rongeurs.  The thecal sac was identified and noted to be tented over the large rightward disc protrusion.  Using a transforaminal approach, the large disc protrusion was identified, using bipolar electrocautery, epidural hemostasis was achieved with bipolar cautery and annulotomy was performed.  There were multiple very large disc fragments that were gently removed with a series of micro curettes and pituitary rongeurs from the lateral recess and the superior migration of the disc herniation all the way up to the T12 pedicle.  Once all fragments were removed, the thecal sac appeared pulsatile and more relaxed.  Epidural hemostasis was achieved with Surgifoam.  Using a ball-tipped probe, the exiting nerve root and common dural tube was explored and noted to be adequately decompressed.  I then decorticated with a high-speed drill the T12-L1 facet joint on the left.  The T12 transverse processes were then removed with Leksell rongeur.  Using bilateral pedicle probes, access to the T12 pedicles.  Using a 6.5 mm tap, the trajectories were tapped and verified to be appropriate with AP fluoroscopy.  Using a ball-tipped probe there were bony borders in all directions.   The above listed pedicle screws were placed at T12 with appropriate bony purchase.  We then placed side-to-side W connectors between the L1 and L2 pedicle screws and then measured contoured and placed bilateral rods from T12 to the L1-L2 W connectors.  Setscrews were placed and final tightened to the manufacturer's recommendation.  A mixture of autograft was then placed along the posterior lateral gutters bilaterally.  Epidural hemostasis was noted to be excellent.  The wound was copiously irrigated and noted be hemostatic.  A Hemovac was tunneled laterally and placed in the epidural space.  Retractors were taken of the wound, wound was closed in layers.  Muscle and fascia were closed with 0 Vicryl, dermis closed with 2-0 and 3-0 Vicryl.  Skin was closed with skin glue.  Sterile dressing was applied.  At the end of the case all sponge, needle, and instrument counts were correct. The patient was then transferred to the stretcher, extubated, and taken to the post-anesthesia care unit in stable hemodynamic condition.

## 2022-11-01 NOTE — Anesthesia Postprocedure Evaluation (Signed)
Anesthesia Post Note  Patient: Aaron Schmidt  Procedure(s) Performed: OPEN THORACOLUMBAR DECOMPRESSION,DISCECTOMY, INSTRUMENTATION AND FUSION THORACIC TWELVE -LUMBAR ONE; EXPLORE LUMBAR ONE-THREE FUSION     Patient location during evaluation: PACU Anesthesia Type: General Level of consciousness: awake and alert Pain management: pain level controlled Vital Signs Assessment: post-procedure vital signs reviewed and stable Respiratory status: spontaneous breathing, nonlabored ventilation, respiratory function stable and patient connected to nasal cannula oxygen Cardiovascular status: blood pressure returned to baseline and stable Postop Assessment: no apparent nausea or vomiting Anesthetic complications: no  There were no known notable events for this encounter.  Last Vitals:  Vitals:   11/01/22 1130 11/01/22 1145  BP: (!) 126/58 (!) 120/58  Pulse: 80 80  Resp: 10 15  Temp:  36.8 C  SpO2: 100% 100%    Last Pain:  Vitals:   11/01/22 1145  PainSc: Asleep                 Sye Schroepfer S

## 2022-11-01 NOTE — Anesthesia Procedure Notes (Signed)
Arterial Line Insertion Start/End2/04/2023 7:30 AM, 11/01/2022 7:35 AM Performed by: Lance Coon, CRNA, CRNA  Patient location: Pre-op. Preanesthetic checklist: patient identified, IV checked, site marked, risks and benefits discussed, surgical consent, monitors and equipment checked, pre-op evaluation, timeout performed and anesthesia consent Lidocaine 1% used for infiltration Right, radial was placed Catheter size: 20 G Hand hygiene performed  and maximum sterile barriers used   Attempts: 1 Procedure performed without using ultrasound guided technique. Following insertion, dressing applied and Biopatch. Post procedure assessment: normal and unchanged

## 2022-11-01 NOTE — Transfer of Care (Signed)
Immediate Anesthesia Transfer of Care Note  Patient: Aaron Schmidt  Procedure(s) Performed: OPEN THORACOLUMBAR DECOMPRESSION,DISCECTOMY, INSTRUMENTATION AND FUSION THORACIC TWELVE -LUMBAR ONE; EXPLORE LUMBAR ONE-THREE FUSION  Patient Location: PACU  Anesthesia Type:General  Level of Consciousness: drowsy and patient cooperative  Airway & Oxygen Therapy: Patient connected to face mask oxygen  Post-op Assessment: Report given to RN and Post -op Vital signs reviewed and stable  Post vital signs: Reviewed and stable  Last Vitals:  Vitals Value Taken Time  BP    Temp    Pulse 76 11/01/22 1047  Resp 19 11/01/22 1047  SpO2 100 % 11/01/22 1047  Vitals shown include unvalidated device data.  Last Pain:  Vitals:   11/01/22 0617  PainSc: 7       Patients Stated Pain Goal: 5 (09/81/19 1478)  Complications: There were no known notable events for this encounter.

## 2022-11-01 NOTE — H&P (Signed)
Providing Compassionate, Quality Care - Together  NEUROSURGERY HISTORY & PHYSICAL   Aaron Schmidt is an 89 y.o. male.   Chief Complaint: Low back pain, difficulty walking HPI: This is an 89 year old male with a history of an L1-S1 fusion, with complaints of worsening severe low back pain, "popping sound" as well as bilateral lower extremity weakness and difficulty standing or walking for more than a period of few minutes.  MRI revealed large disc protrusion with severe stenosis at T12-L1.  There is superior migration of the disc material.  Despite pain control, he continued to have significant bilateral lower extremity claudication and severe low back pain.  He presents today for surgical intervention.  Past Medical History:  Diagnosis Date   Acute kidney injury superimposed on chronic kidney disease ([place]) 09/05/2017   Anemia 09/21/2017   Anemia due to chronic blood loss 01/10/2021   Arthritis    Degeneration spine & stenosis   Benign prostatic hyperplasia 01/10/2021   Benign prostatic hyperplasia with lower urinary tract symptoms 01/10/2021   Body mass index (BMI) 25.0-25.9, adult 08/31/2019   CAD (coronary artery disease) 04/20/2019   Cervical myelopathy with cervical radiculopathy (HCC) 06/24/2014   Cervical spondylosis without myelopathy 05/12/2014   Chronic bilateral low back pain 07/26/2020   Chronic kidney disease, stage 3 unspecified ([place]) 01/10/2021   Chronic low back pain 07/29/2013   Chronic pain 09/05/2017   Degenerative cervical spinal stenosis 05/26/2014   Degenerative lumbar spinal stenosis 09/22/2019   Displacement of cervical intervertebral disc 07/19/2014   Edema 01/10/2021   Elevated troponin 09/05/2017   Empyema lung ([place]) 09/09/2017   Enlarged prostate 01/10/2021   Essential hypertension 08/29/2010   Qualifier: Diagnosis of  By: Elsworth Soho MD, Leanna Sato     Fall in home 07/10/2017   Hardening of the aorta (main artery of the heart) ([place]) 01/10/2021   History of kidney  stones    Hyperlipidemia 06/21/2014   Hypertension    has a histroy of   Hypoalbuminemia 01/10/2021   Hyponatremia 09/05/2017   Hypotension    Impaired fasting glucose 01/10/2021   Intermittent tremor 01/10/2021   Iron deficiency anemia 01/10/2021   Kidney stone 01/10/2021   LBBB (left bundle branch block) 06/21/2014   Lumbar post-laminectomy syndrome 07/15/2013   Lumbar radiculopathy 07/29/2013   Lumbar stenosis with neurogenic claudication 08/31/2019   Malnutrition of moderate degree 03/23/2019   Mitral valve disorder 01/10/2021   Murmur 01/08/2017   Muscle pain 03/01/2020   Myocardial infarction ([place]) 03/21/2019   Neck pain 03/03/2014   Normochromic normocytic anemia 09/05/2017   NSTEMI (non-ST elevated myocardial infarction) ([place]) 03/21/2019   Obesity 04/06/2019   Obstructive sleep apnea 09/30/2010   PSG (180 lbs ) >> severe obstructive sleep apnea with AHi 38/h & desatns to 82%  Started on auto 5-15  >> changed to 8 cm Download reviewed 1/21 - 11/23/10 >> residual events of 6/h , avg pr 8 cm, leak + with nasal pillows     Occasional tremors 07/26/2020   Old myocardial infarction 01/10/2021   Osteoarthrosis 01/10/2021   Osteopenia 01/10/2021   Other specified disorders of kidney and ureter 01/10/2021   Parkinson's disease    Peripheral edema 01/10/2021   Peripheral neuralgia 01/10/2021   Personal history of colonic polyps 01/10/2021   Personal history of other diseases of the respiratory system 01/10/2021   Pleural effusion on left 08/19/2017   Acute symptoms since 08/13/17 > CTa  08/13/17  neg pe/ extensively loculated effusion -  L thoracentesis 08/20/2017 :  220 cc with glucose < 20 and WBC 5,338 mostly Poly, LDH 688 > cyt neg >Referrd to T surgery > VATS 09/09/17 c/w empyema   Pleural effusion, left 09/04/2017   Preoperative cardiovascular examination 06/21/2014   Protein-calorie malnutrition, severe 09/11/2017   Pure hypercholesterolemia 01/10/2021   Sciatica 01/10/2021   Scoliosis concern  08/31/2019   Sensorineural hearing loss (SNHL) of both ears 06/15/2020   Last Assessment & Plan:  Formatting of this note might be different from the original. Concern over worsening hearing. Chronic history of sensorineural hearing loss bilaterally.  Has hearing aids currently.  He feels his hearing aids are not working as well as they historically have.  Recently had them checked. EXAM shows normal external canals and tympanic membranes bilaterally. AUDIOGRAM Shows B   Shoulder joint pain 12/01/2013   Sinusitis, chronic 08/19/2017   CT sinus  08/20/2017 >>> Normally aerated paranasal sinuses.  Patent sinus drainage pathways.   SIRS (systemic inflammatory response syndrome) ([place]) 09/05/2017   Skin ulcer of toe of left foot, limited to breakdown of skin ([place]) 09/04/2016   Sleep apnea 2012   used CPAP 2 yrs. ago, feels he sleeps better w/o, no longer using    Spinal stenosis of lumbar region 08/10/2013   Spinal stenosis, lumbar region without neurogenic claudication 08/19/2019   Symptomatic anemia 09/20/2017   Thrombocytopenia ([place]) 01/10/2021   Tubular adenoma of colon 01/10/2021   Unspecified abnormal finding in specimens from other organs, systems and tissues 01/10/2021    Past Surgical History:  Procedure Laterality Date   ANTERIOR LAT LUMBAR FUSION N/A 09/22/2019   Procedure: Lumbar one-two, Lumbar two-three Anterolateral lumbar interbody fusion with pedicle screw fixation and exploration of adjacent fusion;  Surgeon: Erline Levine, MD;  Location: [facility];  Service: Neurosurgery;  Laterality: N/A;   ARM NEUROPLASTY     at 14 yrs. of age- fell off tractor- had fracture & repair *& later- 1990's had  transplantation of a nerve at the elbow   BACK SURGERY     x4 back surgery x2 fusion -   CARDIAC CATHETERIZATION  03/23/2019   CARPAL TUNNEL RELEASE Right    COLONOSCOPY     ESOPHAGOGASTRODUODENOSCOPY (EGD) WITH PROPOFOL N/A 09/23/2017   Procedure: ESOPHAGOGASTRODUODENOSCOPY (EGD) WITH PROPOFOL;   Surgeon: Laurence Spates, MD;  Location: [facility];  Service: Endoscopy;  Laterality: N/A;   LEFT HEART CATH AND CORONARY ANGIOGRAPHY N/A 03/23/2019   Procedure: LEFT HEART CATH AND CORONARY ANGIOGRAPHY;  Surgeon: Burnell Blanks, MD;  Location: [facility];  Service: Cardiovascular;  Laterality: N/A;   LUMBAR PERCUTANEOUS PEDICLE SCREW 2 LEVEL N/A 09/22/2019   Procedure: LUMBAR PERCUTANEOUS PEDICLE SCREW TWO LEVEL;  Surgeon: Erline Levine, MD;  Location: [facility];  Service: Neurosurgery;  Laterality: N/A;   PLEURAL EFFUSION DRAINAGE Left 09/09/2017   Procedure: DRAINAGE OF PLEURAL EFFUSION;  Surgeon: [name], MD;  Location: [facility];  Service: Thoracic;  Laterality: Left;   POSTERIOR CERVICAL FUSION/FORAMINOTOMY N/A 06/24/2014   Procedure: Posterior Cervical Three-Seven Fusion with Lateral Mass Fixation;  Surgeon: Erline Levine, MD;  Location: [facility];  Service: Neurosurgery;  Laterality: N/A;  C3-C7 posterior cervical fusion with lateral mass fixation   SHOULDER SURGERY Bilateral    VIDEO ASSISTED THORACOSCOPY (VATS)/DECORTICATION Left 09/09/2017   Procedure: VIDEO ASSISTED THORACOSCOPY (VATS)/DECORTICATION;  Surgeon: [name], MD;  Location: [facility];  Service: Thoracic;  Laterality: Left;   VIDEO BRONCHOSCOPY  09/09/2017   Procedure: VIDEO BRONCHOSCOPY;  Surgeon: [name], MD;  Location: [facility];  Service: Thoracic;;    Family History  Problem Relation Age of Onset   Pancreatic cancer Mother    Leukemia Son    Social History:  reports that he quit smoking about 44 years ago. His smoking use included pipe. He has never used smokeless tobacco. He reports current alcohol use. He reports that he does not use drugs.  Allergies:  Allergies  Allergen Reactions   Baclofen Other (See Comments)    CONFUSION, BODY TREMORS   Doxazosin Mesylate Other (See Comments)    Other reaction(s): low BP   Diazepam Other (See Comments)    "makes  goofy" Other reaction(s): confusion    Medications Prior to Admission  Medication Sig Dispense Refill   aspirin EC 81 MG tablet Take 81 mg by mouth daily.     carbidopa-levodopa (SINEMET IR) 25-100 MG tablet 2 tablets at 8am/2 tablets at noon/2 at 4pm (Patient taking differently: Take 1-2 tablets by mouth See admin instructions. Take 2 tablets by mouth at 0700, take 2 tablets by mouth at 1100, take 1 tablet by mouth at 1600 & take 1 tablet by at 1900.) 540 tablet 1   Carbidopa-Levodopa ER (SINEMET CR) 25-100 MG tablet controlled release Take 1 tablet by mouth at bedtime. 90 tablet 1   docusate sodium (COLACE) 100 MG capsule Take 600 mg by mouth in the morning.     finasteride (PROSCAR) 5 MG tablet Take 5 mg by mouth in the morning.     fluocinonide cream (LIDEX) 9.32 % Apply 1 application topically daily as needed (itching).     furosemide (LASIX) 20 MG tablet Take 20 mg by mouth every Monday, Wednesday, and Friday. In the morning     HYDROmorphone (DILAUDID) 2 MG tablet Take 2 tablets (4 mg total) by mouth 4 (four) times daily. (Patient taking differently: Take 2 mg by mouth every 5 (five) hours as needed for moderate pain or severe pain.) 60 tablet 0   L-LYSINE PO Take 1 capsule by mouth in the morning.     losartan (COZAAR) 25 MG tablet Take 1 tablet (25 mg total) by mouth daily. 90 tablet 3   mineral oil liquid Take 90 mLs by mouth daily in the afternoon. 6 tablespoons in the afternoon.     Multiple Minerals-Vitamins (CAL MAG ZINC +D3 PO) Take 1 tablet by mouth in the morning.     Multiple Vitamin (MULTIVITAMIN WITH MINERALS) TABS tablet Take 1 tablet by mouth in the morning. Mature mens 50+     nortriptyline (PAMELOR) 25 MG capsule Take 25 mg by mouth at bedtime.      Polyethyl Glycol-Propyl Glycol (LUBRICANT EYE DROPS) 0.4-0.3 % SOLN Place 1-2 drops into both eyes 3 (three) times daily as needed (dry/irritated eyes.).     pregabalin (LYRICA) 150 MG capsule Take 150 mg by mouth in the  morning.     pyridOXINE (VITAMIN B-6) 100 MG tablet Take 100 mg by mouth in the morning.     Sennosides 15 MG TABS Take 45 mg by mouth in the morning. Perdiem Stimulant Laxative Tablets     simvastatin (ZOCOR) 40 MG tablet Take 40 mg by mouth at bedtime.     SUPER B COMPLEX/C PO Take 1 capsule by mouth in the morning.     tamsulosin (FLOMAX) 0.4 MG CAPS capsule Take 0.8 mg by mouth in the morning.     tiZANidine (ZANAFLEX) 2 MG tablet Take 2 mg by mouth  every 8 (eight) hours.     traZODone (DESYREL) 150 MG tablet Take 150 mg by mouth at bedtime.     vitamin B-12 (CYANOCOBALAMIN) 1000 MCG tablet Take 1,000 mcg by mouth in the morning.     vitamin C (ASCORBIC ACID) 500 MG tablet Take 500 mg by mouth in the morning.     VITAMIN D PO Take 2,000 Units by mouth in the morning.      No results found for this or any previous visit (from the past 48 hour(s)). ECHOCARDIOGRAM COMPLETE  Result Date: 10/31/2022    ECHOCARDIOGRAM REPORT   Patient Name:   Aaron Schmidt Date of Exam: 10/31/2022 Medical Rec #:  [id number]       Height:       70.0 in Accession #:    [id number]      Weight:       150.0 lb Date of Birth:  1932-12-16        BSA:          1.847 m Patient Age:    32 years        BP:           102/50 mmHg Patient Gender: M               HR:           78 bpm. Exam Location:  [facility] Procedure: 2D Echo, Cardiac Doppler and Color Doppler Indications:    Dyspnea on exertion [R06.09 (ICD-10-CM)]  History:        Patient has prior history of Echocardiogram examinations, most                 recent 08/21/2021. Arrythmias:LBBB; Risk Factors:Hypertension.  Sonographer:    Philipp Deputy RDCS Referring Phys: [id number] [place]  Sonographer Comments: Restricted mobility. Global longitudinal strain was attempted. IMPRESSIONS  1. Left ventricular ejection fraction, by estimation, is 55 to 60%. The left ventricle has normal function. The left ventricle has no regional wall motion abnormalities. Left ventricular  diastolic parameters are indeterminate.  2. Right ventricular systolic function is normal. The right ventricular size is normal.  3. Left atrial size was moderately dilated.  4. Possibly flailed posterior leaflet of MV. Unchange as compared to old echo. The mitral valve is myxomatous. Moderate mitral valve regurgitation. No evidence of mitral stenosis.  5. The aortic valve is normal in structure. Aortic valve regurgitation is not visualized. Aortic valve sclerosis/calcification is present, without any evidence of aortic stenosis.  6. The inferior vena cava is normal in size with greater than 50% respiratory variability, suggesting right atrial pressure of 3 mmHg. FINDINGS  Left Ventricle: Left ventricular ejection fraction, by estimation, is 55 to 60%. The left ventricle has normal function. The left ventricle has no regional wall motion abnormalities. The left ventricular internal cavity size was normal in size. There is  no left ventricular hypertrophy. Left ventricular diastolic parameters are indeterminate. Right Ventricle: The right ventricular size is normal. No increase in right ventricular wall thickness. Right ventricular systolic function is normal. Left Atrium: Left atrial size was moderately dilated. Right Atrium: Right atrial size was normal in size. Pericardium: There is no evidence of pericardial effusion. Mitral Valve: Possibly flailed posterior leaflet of MV. Unchange as compared to old echo. The mitral valve is myxomatous. There is moderate thickening of the mitral valve leaflet(s). Moderate mitral valve regurgitation, with anteriorly-directed jet. No evidence of mitral valve stenosis. MV peak gradient, 17.8 mmHg. The mean  mitral valve gradient is 10.0 mmHg. Tricuspid Valve: The tricuspid valve is normal in structure. Tricuspid valve regurgitation is mild . No evidence of tricuspid stenosis. Aortic Valve: The aortic valve is normal in structure. Aortic valve regurgitation is not visualized. Aortic  valve sclerosis/calcification is present, without any evidence of aortic stenosis. Pulmonic Valve: The pulmonic valve was normal in structure. Pulmonic valve regurgitation is not visualized. No evidence of pulmonic stenosis. Aorta: The aortic root is normal in size and structure. Venous: The inferior vena cava is normal in size with greater than 50% respiratory variability, suggesting right atrial pressure of 3 mmHg. IAS/Shunts: No atrial level shunt detected by color flow Doppler.  LEFT VENTRICLE PLAX 2D LVIDd:         4.90 cm   Diastology LVIDs:         3.60 cm   LV e' medial:   11.20 cm/s LV PW:         1.00 cm   LV E/e' medial: 17.0 LV IVS:        1.30 cm LVOT diam:     2.10 cm LV SV:         82 LV SV Index:   45 LVOT Area:     3.46 cm  RIGHT VENTRICLE             IVC RV Basal diam:  3.10 cm     IVC diam: 2.30 cm RV Mid diam:    2.60 cm RV S prime:     14.50 cm/s TAPSE (M-mode): 3.1 cm LEFT ATRIUM             Index        RIGHT ATRIUM           Index LA diam:        3.50 cm 1.89 cm/m   RA Area:     12.20 cm LA Vol (A2C):   65.2 ml 35.30 ml/m  RA Volume:   21.70 ml  11.75 ml/m LA Vol (A4C):   84.9 ml 45.96 ml/m LA Biplane Vol: 73.9 ml 40.01 ml/m  AORTIC VALVE LVOT Vmax:   126.00 cm/s LVOT Vmean:  79.500 cm/s LVOT VTI:    0.238 m  AORTA Ao Root diam: 3.60 cm Ao Asc diam:  3.40 cm MITRAL VALVE MV Area (PHT): 4.71 cm     SHUNTS MV Area VTI:   2.12 cm     Systemic VTI:  0.24 m MV Peak grad:  17.8 mmHg    Systemic Diam: 2.10 cm MV Mean grad:  10.0 mmHg MV Vmax:       2.11 m/s MV Vmean:      146.0 cm/s MV Decel Time: 161 msec MR Peak grad: 58.7 mmHg MR Vmax:      383.00 cm/s MV E velocity: 190.00 cm/s Jenne Campus MD Electronically signed by Jenne Campus MD Signature Date/Time: 10/31/2022/1:34:19 PM    Final     ROS All pertinent positives and negatives listed HPI above  Blood pressure 137/71, pulse 85, temperature 97.6 F (36.4 C), resp. rate 18, height '5\' 10"'$  (1.778 m), weight 68 kg, SpO2 100  %. Physical Exam  Awake alert Orient x 3, no acute distress PERRLA Cranial nerves II through XII intact Bilateral upper extremities full strength Bilateral lower extremities 3/5 Sensory intact to light touch Nonlabored breathing Speech fluent and appropriate  Assessment/Plan 89 year old male with  T12-L1 herniated nucleus pulposus with severe stenosis and neurogenic claudication  -OR today for T12-L1 decompression,  discectomy, instrumentation and extension posterolateral fusion with exploration of fusion L1-3.  All risks, benefits and expected outcomes were discussed and agreed upon as well as alternatives to treatment.  Cardiac clearance was obtained.  Informed consent was obtained and witnessed.   Thank you for allowing me to participate in this patient's care.  Please do not hesitate to call with questions or concerns.   Elwin Sleight, [facility] Cell: [phone]

## 2022-11-02 LAB — BASIC METABOLIC PANEL
Anion gap: 6 (ref 5–15)
BUN: 18 mg/dL (ref 8–23)
CO2: 30 mmol/L (ref 22–32)
Calcium: 8.5 mg/dL — ABNORMAL LOW (ref 8.9–10.3)
Chloride: 99 mmol/L (ref 98–111)
Creatinine, Ser: 0.9 mg/dL (ref 0.61–1.24)
GFR, Estimated: >60 mL/min (ref >60)
Glucose, Bld: 96 mg/dL (ref 70–99)
Potassium: 3.8 mmol/L (ref 3.5–5.1)
Sodium: 135 mmol/L (ref 135–145)

## 2022-11-02 LAB — CBC
HCT: 26 % — ABNORMAL LOW (ref 39.0–52.0)
Hemoglobin: 8.1 g/dL — ABNORMAL LOW (ref 13.0–17.0)
MCH: 29.5 pg (ref 26.0–34.0)
MCHC: 31.2 g/dL (ref 30.0–36.0)
MCV: 94.5 fL (ref 80.0–100.0)
Platelets: 148 10*3/uL — ABNORMAL LOW (ref 150–400)
RBC: 2.75 MIL/uL — ABNORMAL LOW (ref 4.22–5.81)
RDW: 13.4 % (ref 11.5–15.5)
WBC: 9.3 10*3/uL (ref 4.0–10.5)
nRBC: 0 % (ref 0.0–0.2)

## 2022-11-02 NOTE — Plan of Care (Signed)

## 2022-11-02 NOTE — Progress Notes (Signed)
   Providing Compassionate, Quality Care - Together  NEUROSURGERY PROGRESS NOTE   S: No issues overnight.   O: EXAM:  BP (!) 92/46 (BP Location: Left Arm)   Pulse 74   Temp 97.8 F (36.6 C) (Oral)   Resp 16   Ht '5\' 10"'$  (1.778 m)   Wt 68 kg   SpO2 100%   BMI 21.52 kg/m   Awake, alert, oriented x3 PERRL Speech fluent, appropriate  CNs grossly intact  5/5 BUE 4/5 BLE   ASSESSMENT:  89 y.o. male with   T12-L1 large herniated nucleus pulposus with severe stenosis  Status post T12-L1 decompression, extension instrumentation and fusion  PLAN: -PT/OT-will need rehab, holding a spot at [facility]    Thank you for allowing me to participate in this patient's care.  Please do not hesitate to call with questions or concerns.   Elwin Sleight, [facility] Cell: [phone]

## 2022-11-02 NOTE — Evaluation (Signed)
Physical Therapy Evaluation Patient Details Name: Aaron Schmidt MRN: [id number] DOB: 1933-07-03 Today's Date: 11/02/2022  History of Present Illness  Pt is an 89 y.o. male who presented 11/01/22 for T12-L1 decompression, extension instrumentation and fusion. PMHx: HTN, CAD, MI, OA, anemia, CKD stage 3, HLD, OSA, Parkinson's disease, peripheral neuralgia   Clinical Impression  Pt presents with condition above and deficits mentioned below, see PT Problem List. PTA, he was mod I supporting himself with walls or furniture for household mobility and using a cane vs rollator for community mobility, with his wife intermittently pushing him in a transport chair. Pt resides at [place] with his wife. Currently, pt reports bil entire lower extremity numbness (L>R) and demonstrates gross weakness and incoordination in his bil lower extremities. He also has baseline L UE ROM deficits. Pt is currently requiring min-modA for bed mobility, modA to transfer to stand, and min-modA to ambulate short distances in the room with a RW. He is at high risk for falls, and would benefit from short-term rehab at a SNF upon d/c. Pt reports they were arranging for him to d/c to [facility] for rehab. Will continue to follow acutely.     Recommendations for follow up therapy are one component of a multi-disciplinary discharge planning process, led by the attending physician.  Recommendations may be updated based on patient status, additional functional criteria and insurance authorization.  Follow Up Recommendations Skilled nursing-short term rehab (<3 hours/day) (at [facility]) Can patient physically be transported by private vehicle: Yes    Assistance Recommended at Discharge Frequent or constant Supervision/Assistance  Patient can return home with the following  A lot of help with walking and/or transfers;A lot of help with bathing/dressing/bathroom;Assistance with cooking/housework;Assist for transportation     Equipment Recommendations Other (comment) (defer to next venue of care)  Recommendations for Other Services       Functional Status Assessment Patient has had a recent decline in their functional status and demonstrates the ability to make significant improvements in function in a reasonable and predictable amount of time.     Precautions / Restrictions Precautions Precautions: Fall;Back Precaution Booklet Issued: Yes (comment) Precaution Comments: reviewed spinal precautions Required Braces or Orthoses:  (no brace needed) Restrictions Weight Bearing Restrictions: No      Mobility  Bed Mobility Overal bed mobility: Needs Assistance Bed Mobility: Rolling, Sidelying to Sit Rolling: Min assist Sidelying to sit: Mod assist, HOB elevated       General bed mobility comments: Cues to log roll for spinal precautions compliance, minA to guide R UE to L bed rail and rotate hips. ModA to bring legs off EOB and ascend trunk to sit up, cuing pt to get UEs under his trunk to push up.    Transfers Overall transfer level: Needs assistance Equipment used: Rolling walker (2 wheels) Transfers: Sit to/from Stand, Bed to chair/wheelchair/BSC Sit to Stand: Mod assist   Step pivot transfers: Min assist       General transfer comment: Pt with posterior bias, needing modA to shift weight anteriorly and power up to stand from EOB. MinA for stability with stand step pivot to L bed > recliner with RW.    Ambulation/Gait Ambulation/Gait assistance: Min assist, Mod assist Gait Distance (Feet): 11 Feet Assistive device: Rolling walker (2 wheels) Gait Pattern/deviations: Step-to pattern, Decreased step length - right, Decreased step length - left, Decreased stride length, Decreased dorsiflexion - right, Decreased dorsiflexion - left, Knee flexed in stance - right, Knee flexed in stance -  left, Shuffle, Trunk flexed, Narrow base of support Gait velocity: reduced Gait velocity interpretation: <1.31  ft/sec, indicative of household ambulator   General Gait Details: Pt with slow, small, shuffling steps and Parkinsonian gait pattern with narrow BOS. Pt bouncing at knees when stepping posteriorly, needing up to modA to maintain balance.  Stairs            Wheelchair Mobility    Modified Rankin (Stroke Patients Only)       Balance Overall balance assessment: Needs assistance Sitting-balance support: No upper extremity supported, Feet supported Sitting balance-Leahy Scale: Fair Sitting balance - Comments: Static sitting EOB with supervision for safety   Standing balance support: Bilateral upper extremity supported, During functional activity, Reliant on assistive device for balance Standing balance-Leahy Scale: Poor Standing balance comment: Reliant on RW and min-modA                             Pertinent Vitals/Pain Pain Assessment Pain Assessment: Faces Faces Pain Scale: Hurts little more Pain Location: back Pain Descriptors / Indicators: Discomfort, Grimacing, Operative site guarding Pain Intervention(s): Limited activity within patient's tolerance, Monitored during session, Repositioned, Premedicated before session    Home Living Family/patient expects to be discharged to:: Assisted living ([facility]) Living Arrangements: Spouse/significant other               Home Equipment: Conservation officer, nature (2 wheels);Rollator (4 wheels);Cane - quad;Cane - single point;Shower seat - built in;Grab bars - toilet;Grab bars - tub/shower;Transport chair Additional Comments: Level entry with elevator access to their apartment, wife is available 24/7, walk-in shower with comfort height toilets    Prior Function Prior Level of Function : Needs assist             Mobility Comments: Mod I holding onto walls/furniture for support in home, used rollator vs cane outside with wife intermittently pushing pt in transport chair as needed ADLs Comments: Wife assists with  managing shoes and intermittently assists with donning pants and depends depending on how well his L UE is moving (hx of injury since childhood limiting ROM).     Hand Dominance        Extremity/Trunk Assessment   Upper Extremity Assessment Upper Extremity Assessment: Defer to OT evaluation (hx of L UE ROM limitations due to childhood injury)    Lower Extremity Assessment Lower Extremity Assessment: RLE deficits/detail;LLE deficits/detail;Generalized weakness RLE Deficits / Details: MMT scores of 4 knee extension, 4 ankle dorsiflexion; reports slight numbness throughout entire leg (worse on L than R though); gross incoordination noted RLE Sensation: decreased light touch RLE Coordination: decreased gross motor LLE Deficits / Details: MMT scores of 4 knee extension, 4- ankle dorsiflexion; reports slight numbness throughout entire leg (worse on L than R though); gross incoordination noted LLE Sensation: decreased light touch LLE Coordination: decreased gross motor    Cervical / Trunk Assessment Cervical / Trunk Assessment: Back Surgery  Communication   Communication: HOH (has hearing aids)  Cognition Arousal/Alertness: Awake/alert Behavior During Therapy: WFL for tasks assessed/performed Overall Cognitive Status: Within Functional Limits for tasks assessed                                 General Comments: Slow processing but could be due to being [facility]. Likely at baseline        General Comments General comments (skin integrity, edema, etc.): VSS on RA  Exercises     Assessment/Plan    PT Assessment Patient needs continued PT services  PT Problem List Decreased strength;Decreased activity tolerance;Decreased balance;Decreased mobility;Decreased coordination;Impaired sensation       PT Treatment Interventions DME instruction;Gait training;Functional mobility training;Therapeutic activities;Therapeutic exercise;Balance training;Neuromuscular  re-education;Patient/family education    PT Goals (Current goals can be found in the Care Plan section)  Acute Rehab PT Goals Patient Stated Goal: to go to SNF at [facility] PT Goal Formulation: With patient Time For Goal Achievement: 11/16/22 Potential to Achieve Goals: Good    Frequency Min 5X/week     Co-evaluation               AM-PAC PT "6 Clicks" Mobility  Outcome Measure Help needed turning from your back to your side while in a flat bed without using bedrails?: A Little Help needed moving from lying on your back to sitting on the side of a flat bed without using bedrails?: A Lot Help needed moving to and from a bed to a chair (including a wheelchair)?: A Little Help needed standing up from a chair using your arms (e.g., wheelchair or bedside chair)?: A Lot Help needed to walk in hospital room?: Total Help needed climbing 3-5 steps with a railing? : Total 6 Click Score: 12    End of Session Equipment Utilized During Treatment: Gait belt Activity Tolerance: Patient tolerated treatment well Patient left: in chair;with call bell/phone within reach;with chair alarm set   PT Visit Diagnosis: Unsteadiness on feet (R26.81);Other abnormalities of gait and mobility (R26.89);Muscle weakness (generalized) (M62.81);Difficulty in walking, not elsewhere classified (R26.2);Pain Pain - part of body:  (back)    Time: [id number] PT Time Calculation (min) (ACUTE ONLY): 33 min   Charges:   PT Evaluation $PT Eval Moderate Complexity: 1 Mod PT Treatments $Therapeutic Activity: 8-22 mins        Moishe Spice, PT, DPT Acute Rehabilitation Services  Office: [phone]   Orvan Falconer 11/02/2022, 11:22 AM

## 2022-11-02 NOTE — Evaluation (Signed)
Occupational Therapy Evaluation Patient Details Name: Aaron Schmidt MRN: [id number] DOB: 1933-02-25 Today's Date: 11/02/2022   History of Present Illness 89 y.o. male who presented 11/01/22 for T12-L1 decompression, extension instrumentation and fusion. PMHx: HTN, CAD, MI, OA, anemia, CKD stage 3, HLD, OSA, Parkinson's disease, peripheral neuralgia   Clinical Impression   Patient is s/p T12-L1 surgery resulting in functional limitations due to the deficits listed below (see OT problem list). Pt at baseline with wife (A) and living in indep living at [place]. Pt plans to d/c to skilled portion of facility at d/c. Pt currently with immediate posterior lean with static standing. Pt educated on back precautions with bed mobility and basic transfers.  Patient will benefit from skilled OT acutely to increase independence and safety with ADLS to allow discharge SNF.       Recommendations for follow up therapy are one component of a multi-disciplinary discharge planning process, led by the attending physician.  Recommendations may be updated based on patient status, additional functional criteria and insurance authorization.   Follow Up Recommendations  Skilled nursing-short term rehab (<3 hours/day) ([facility])     Assistance Recommended at Discharge Set up Supervision/Assistance  Patient can return home with the following Two people to help with walking and/or transfers;Two people to help with bathing/dressing/bathroom    Functional Status Assessment  Patient has had a recent decline in their functional status and demonstrates the ability to make significant improvements in function in a reasonable and predictable amount of time.  Equipment Recommendations  BSC/3in1;Other (comment) (RW)    Recommendations for Other Services       Precautions / Restrictions Precautions Precautions: Fall;Back Precaution Booklet Issued: Yes (comment) Precaution Comments: reviewed spinal precautions for  adls Required Braces or Orthoses:  (no brace needed) Restrictions Weight Bearing Restrictions: No      Mobility Bed Mobility Overal bed mobility: Needs Assistance Bed Mobility: Rolling, Supine to Sit, Sit to Supine Rolling: Min assist   Supine to sit: Mod assist Sit to supine: Mod assist   General bed mobility comments: pt requires (A) to roll onto side with pad. pt expressed concerns for hearing aides due to one of them needing piece fixed by OT. pt requires (A) to elevate trunk from surface with L UE. pt requires total (A) with bil LE to lift on and off bed surface. pt static sitting min (A) initially    Transfers Overall transfer level: Needs assistance Equipment used: Rolling walker (2 wheels) Transfers: Sit to/from Stand, Bed to chair/wheelchair/BSC Sit to Stand: Mod assist     Step pivot transfers: Min assist     General transfer comment: Pt with posterior bias, needing modA to shift weight anteriorly and power up to stand from EOB. MinA for stability with stand step pivot to L bed > recliner with RW. pt required x2 sit<>Stand to achieve standing      Balance Overall balance assessment: Needs assistance Sitting-balance support: No upper extremity supported, Feet supported Sitting balance-Leahy Scale: Fair     Standing balance support: Bilateral upper extremity supported, During functional activity, Reliant on assistive device for balance Standing balance-Leahy Scale: Poor                             ADL either performed or assessed with clinical judgement   ADL Overall ADL's : Needs assistance/impaired Eating/Feeding: Set up   Grooming: Wash/dry hands   Upper Body Bathing: Moderate assistance   Lower Body  Bathing: Maximal assistance   Upper Body Dressing : Moderate assistance   Lower Body Dressing: Moderate assistance;Maximal assistance                 General ADL Comments: pt requires (A) for all adls at thi stime     Vision  Baseline Vision/History: 1 Wears glasses       Perception     Praxis      Pertinent Vitals/Pain Pain Assessment Pain Assessment: Faces Faces Pain Scale: Hurts little more Pain Location: back Pain Descriptors / Indicators: Discomfort, Grimacing, Operative site guarding Pain Intervention(s): Monitored during session, Premedicated before session, Repositioned     Hand Dominance Right   Extremity/Trunk Assessment Upper Extremity Assessment Upper Extremity Assessment: Generalized weakness;LUE deficits/detail LUE Deficits / Details: pt reports L UE is useless but pt is able to initiate transfer   Lower Extremity Assessment Lower Extremity Assessment: Defer to PT evaluation   Cervical / Trunk Assessment Cervical / Trunk Assessment: Back Surgery   Communication Communication Communication: HOH (has hearing aids)   Cognition Arousal/Alertness: Awake/alert Behavior During Therapy: WFL for tasks assessed/performed Overall Cognitive Status: Within Functional Limits for tasks assessed                                 General Comments: Slow processing but could be due to being [facility]. Likely at baseline     General Comments  VSS, pt with BP MAP 71-73 throughout session    Exercises     Shoulder Instructions      Home Living Family/patient expects to be discharged to:: Assisted living ([facility]) Living Arrangements: Spouse/significant other                           Home Equipment: Rolling Walker (2 wheels);Rollator (4 wheels);Cane - quad;Cane - single point;Shower seat - built in;Grab bars - toilet;Grab bars - tub/shower;Transport chair   Additional Comments: Level entry with elevator access to their apartment, wife is available 24/7, walk-in shower with comfort height toilets. pt resident of [facility] indep living      Prior Functioning/Environment Prior Level of Function : Needs assist             Mobility Comments: Mod I holding onto  walls/furniture for support in home, used rollator vs cane outside with wife intermittently pushing pt in transport chair as needed ADLs Comments: Wife assists with managing shoes and intermittently assists with donning pants and depends depending on how well his L UE is moving (hx of injury since childhood limiting ROM).        OT Problem List: Decreased strength;Decreased activity tolerance;Impaired balance (sitting and/or standing);Decreased safety awareness;Decreased knowledge of use of DME or AE;Decreased knowledge of precautions      OT Treatment/Interventions: Self-care/ADL training;Therapeutic exercise;DME and/or AE instruction;Energy conservation;Therapeutic activities;Patient/family education;Balance training;Neuromuscular education    OT Goals(Current goals can be found in the care plan section) Acute Rehab OT Goals Patient Stated Goal: to fix hearing aide- OT was able to fix issue and get device back in patients ear correctly. OT Goal Formulation: With patient/family Time For Goal Achievement: 11/16/22 Potential to Achieve Goals: Good  OT Frequency: Min 2X/week    Co-evaluation              AM-PAC OT "6 Clicks" Daily Activity     Outcome Measure Help from another person eating meals?: A Little Help from  another person taking care of personal grooming?: A Little Help from another person toileting, which includes using toliet, bedpan, or urinal?: A Lot Help from another person bathing (including washing, rinsing, drying)?: A Lot Help from another person to put on and taking off regular upper body clothing?: A Little Help from another person to put on and taking off regular lower body clothing?: A Lot 6 Click Score: 15   End of Session Equipment Utilized During Treatment: Rolling walker (2 wheels) Nurse Communication: Mobility status;Precautions  Activity Tolerance: Patient tolerated treatment well Patient left: in bed;with call bell/phone within reach;with restraints  reapplied  OT Visit Diagnosis: Unsteadiness on feet (R26.81);Muscle weakness (generalized) (M62.81)                Time: [id number] OT Time Calculation (min): 18 min Charges:  OT General Charges $OT Visit: 1 Visit OT Evaluation $OT Eval Moderate Complexity: 1 Mod   Brynn, OTR/L  Acute Rehabilitation Services Office: [phone] .   Jeri Modena 11/02/2022, 3:17 PM

## 2022-11-03 MED ORDER — SODIUM CHLORIDE 0.9 % IV SOLN: INTRAVENOUS | Status: DC

## 2022-11-03 MED ORDER — SODIUM CHLORIDE 0.9 % IV BOLUS
1000.0000 mL | Freq: Once | INTRAVENOUS | Status: AC
  Administered 2022-11-03: 1000 mL via INTRAVENOUS

## 2022-11-03 NOTE — Progress Notes (Signed)
NEUROSURGERY PROGRESS NOTE  Doing well. Complains of appropriate back soreness. No leg pain No numbness, tingling or weakness Ambulating and voiding well Good strength and sensation Incision CDI  Temp:  [97.6 F (36.4 C)-98.7 F (37.1 C)] 98 F (36.7 C) (02/09 2318) Pulse Rate:  [74-88] 88 (02/09 1904) Resp:  [15-16] 16 (02/09 2318) BP: (90-121)/(45-98) 112/45 (02/09 1904) SpO2:  [94 %-100 %] 98 % (02/09 1904)  Plan: Continue therapy for now. Awaiting [facility]  Aaron Chiquito, NP 11/03/2022 3:23 AM

## 2022-11-03 NOTE — Progress Notes (Addendum)
Pt continuing to be hypotensive despite fluid bolus. Order from Glenford Peers, NP, to discontinue Losartan and initiate maintenance fluids.  Justice Rocher, RN

## 2022-11-03 NOTE — Progress Notes (Signed)
Pt became hypotensive while working with physical therapy around 0900. This RN made aware after session and went to bedside. BP 74/40 on my arrival. Pt having difficulty staying awake, but is oriented, and denies other symptoms. Verbal order from Glenford Peers, NP, for 1L NS bolus over 1 hour. Per PT, this is the first time pt has become hypotensive with therapies.   Justice Rocher, RN

## 2022-11-03 NOTE — Progress Notes (Signed)
Physical Therapy Treatment Patient Details Name: Aaron Schmidt MRN: [id number] DOB: 08/04/1933 Today's Date: 11/03/2022   History of Present Illness Pt is an 89 y.o. male who presented 11/01/22 for T12-L1 decompression, extension instrumentation and fusion. PMHx: HTN, CAD, MI, OA, anemia, CKD stage 3, HLD, OSA, Parkinson's disease, peripheral neuralgia    PT Comments    Treatment session limited by orthostatic hypotension. Assisted pt to edge of bed with moderate assist. Once sitting on edge of bed, pt complaining of dizziness. BP measured 81/54 (65). Worked on a few seated exercises for BLE strengthening. BP then re-measured to be 77/37 (51). Quickly assisted pt back into supine. Supine BP 89/46 (59). RN notified. Will continue to progress as tolerated.     Recommendations for follow up therapy are one component of a multi-disciplinary discharge planning process, led by the attending physician.  Recommendations may be updated based on patient status, additional functional criteria and insurance authorization.  Follow Up Recommendations  Skilled nursing-short term rehab (<3 hours/day) (at [facility]) Can patient physically be transported by private vehicle: Yes   Assistance Recommended at Discharge Frequent or constant Supervision/Assistance  Patient can return home with the following A lot of help with walking and/or transfers;A lot of help with bathing/dressing/bathroom;Assistance with cooking/housework;Assist for transportation   Equipment Recommendations  Other (comment) (defer to next venue of care)    Recommendations for Other Services       Precautions / Restrictions Precautions Precautions: Fall;Back;Other (comment) Precaution Booklet Issued: Yes (comment) Precaution Comments: watch BP Required Braces or Orthoses:  (no brace needed) Restrictions Weight Bearing Restrictions: No     Mobility  Bed Mobility Overal bed mobility: Needs Assistance Bed Mobility: Rolling,  Sidelying to Sit Rolling: Mod assist Sidelying to sit: Mod assist, HOB elevated       General bed mobility comments: Cues for log roll technique, pt initiating reaching over for rail. Heavy modA and use of bed pad to bring hips into sidelying position, assist for BLE's off edge of bed and trunk to upright    Transfers                   General transfer comment: deferred due to hypotension    Ambulation/Gait                   Stairs             Wheelchair Mobility    Modified Rankin (Stroke Patients Only)       Balance Overall balance assessment: Needs assistance Sitting-balance support: Feet supported, Bilateral upper extremity supported Sitting balance-Leahy Scale: Poor Sitting balance - Comments: reliant on BUE support                                    Cognition Arousal/Alertness: Awake/alert Behavior During Therapy: WFL for tasks assessed/performed Overall Cognitive Status: Within Functional Limits for tasks assessed                                 General Comments: Slow processing but could be due to being [facility]. Likely at baseline        Exercises General Exercises - Lower Extremity Long Arc Quad: Both, AAROM, 10 reps, Seated Hip ABduction/ADduction: Both, 10 reps, Seated Heel Raises: Both, 10 reps, Seated    General Comments  Pertinent Vitals/Pain Pain Assessment Pain Assessment: Faces Faces Pain Scale: Hurts even more Pain Location: back Pain Descriptors / Indicators: Discomfort, Grimacing, Operative site guarding Pain Intervention(s): Limited activity within patient's tolerance, Monitored during session, Premedicated before session    Home Living                          Prior Function            PT Goals (current goals can now be found in the care plan section) Acute Rehab PT Goals Patient Stated Goal: to go to SNF at [facility] PT Goal Formulation: With patient Time For  Goal Achievement: 11/16/22 Potential to Achieve Goals: Good Progress towards PT goals: Not progressing toward goals - comment (due to BP)    Frequency    Min 5X/week      PT Plan Current plan remains appropriate    Co-evaluation              AM-PAC PT "6 Clicks" Mobility   Outcome Measure  Help needed turning from your back to your side while in a flat bed without using bedrails?: A Lot Help needed moving from lying on your back to sitting on the side of a flat bed without using bedrails?: A Lot Help needed moving to and from a bed to a chair (including a wheelchair)?: A Little Help needed standing up from a chair using your arms (e.g., wheelchair or bedside chair)?: A Lot Help needed to walk in hospital room?: Total Help needed climbing 3-5 steps with a railing? : Total 6 Click Score: 11    End of Session   Activity Tolerance: Other (comment) (limited by hypotension) Patient left: with call bell/phone within reach;in bed;with bed alarm set Nurse Communication: Mobility status;Other (comment) (BP) PT Visit Diagnosis: Unsteadiness on feet (R26.81);Other abnormalities of gait and mobility (R26.89);Muscle weakness (generalized) (M62.81);Difficulty in walking, not elsewhere classified (R26.2);Pain Pain - part of body:  (back)     Time: [id number] PT Time Calculation (min) (ACUTE ONLY): 23 min  Charges:  $Therapeutic Activity: 23-37 mins                     Aaron Schmidt, PT, DPT Acute Rehabilitation Services Office [phone]    Aaron Schmidt 11/03/2022, 10:07 AM

## 2022-11-04 LAB — CBC
HCT: 29.2 % — ABNORMAL LOW (ref 39.0–52.0)
Hemoglobin: 9.4 g/dL — ABNORMAL LOW (ref 13.0–17.0)
MCH: 30.1 pg (ref 26.0–34.0)
MCHC: 32.2 g/dL (ref 30.0–36.0)
MCV: 93.6 fL (ref 80.0–100.0)
Platelets: 145 10*3/uL — ABNORMAL LOW (ref 150–400)
RBC: 3.12 MIL/uL — ABNORMAL LOW (ref 4.22–5.81)
RDW: 13.4 % (ref 11.5–15.5)
WBC: 8.2 10*3/uL (ref 4.0–10.5)
nRBC: 0 % (ref 0.0–0.2)

## 2022-11-04 LAB — BASIC METABOLIC PANEL
Anion gap: 7 (ref 5–15)
BUN: 16 mg/dL (ref 8–23)
CO2: 27 mmol/L (ref 22–32)
Calcium: 8.5 mg/dL — ABNORMAL LOW (ref 8.9–10.3)
Chloride: 103 mmol/L (ref 98–111)
Creatinine, Ser: 1.01 mg/dL (ref 0.61–1.24)
GFR, Estimated: >60 mL/min (ref >60)
Glucose, Bld: 200 mg/dL — ABNORMAL HIGH (ref 70–99)
Potassium: 3.9 mmol/L (ref 3.5–5.1)
Sodium: 137 mmol/L (ref 135–145)

## 2022-11-04 MED ORDER — SODIUM CHLORIDE 0.9 % IV SOLN: INTRAVENOUS | Status: DC

## 2022-11-04 MED ORDER — SODIUM CHLORIDE 0.9 % IV SOLN: INTRAVENOUS | Status: AC

## 2022-11-04 MED ORDER — POLYETHYLENE GLYCOL 3350 17 G PO PACK
17.0000 g | PACK | Freq: Every day | ORAL | Status: DC
  Administered 2022-11-04 – 2022-11-07 (×4): 17 g via ORAL
  Filled 2022-11-04 (×4): qty 1

## 2022-11-04 MED ORDER — KETOROLAC TROMETHAMINE 15 MG/ML IJ SOLN
15.0000 mg | Freq: Three times a day (TID) | INTRAMUSCULAR | Status: AC
  Administered 2022-11-04 – 2022-11-05 (×4): 15 mg via INTRAVENOUS
  Filled 2022-11-04 (×4): qty 1

## 2022-11-04 MED ORDER — BISACODYL 10 MG RE SUPP
10.0000 mg | Freq: Every day | RECTAL | Status: DC | PRN
  Filled 2022-11-04: qty 1

## 2022-11-04 MED ORDER — FLEET ENEMA 7-19 GM/118ML RE ENEM
1.0000 | ENEMA | Freq: Every day | RECTAL | Status: DC | PRN
  Administered 2022-11-05: 1 via RECTAL
  Filled 2022-11-04 (×2): qty 1

## 2022-11-04 NOTE — Progress Notes (Signed)
NEUROSURGERY PROGRESS NOTE  Doing well. Complains of appropriate back soreness. Good strength and sensation Incision CDI Has had some hypotension episodes. I do think this is related to his pain medication and muscle relaxers. Continue maintenance fluids for now. Try to avoid narcotics as much as possible  Temp:  [97.9 F (36.6 C)-98.2 F (36.8 C)] 98 F (36.7 C) (02/11 0817) Pulse Rate:  [62-86] 62 (02/11 0817) Resp:  [14-16] 16 (02/11 0817) BP: (74-126)/(37-68) 82/43 (02/11 0817) SpO2:  [90 %-100 %] 95 % (02/11 0817)   Eleonore Chiquito, NP 11/04/2022 8:58 AM

## 2022-11-04 NOTE — Progress Notes (Signed)
Occupational Therapy Treatment Patient Details Name: Aaron Schmidt MRN: [id number] DOB: 1932/10/26 Today's Date: 11/04/2022   History of present illness Pt is an 89 y.o. male who presented 11/01/22 for T12-L1 decompression, extension instrumentation and fusion. PMHx: HTN, CAD, MI, OA, anemia, CKD stage 3, HLD, OSA, Parkinson's disease, peripheral neuralgia   OT comments  Pt. Seen for skilled OT treatment.  BPs more stable and pt. Feeling better per his report.   Eager and agreeable for oob.  Bed mobility and short distance in room ambulation to recliner mod with heavy support to offset post. Lob.  Cues for hand placement and sequencing for safe transfer to recliner.  Agree with current d/c recommendations as pt. Will benefit from cont. Therapies as he is a fall risk with post. Lob x1 during session today.  cont. With current poc.   BP during session:  Supine to sit: 118/52-70 Sit to stand: 119/89-98 Seated after activity: 122/53-72   Recommendations for follow up therapy are one component of a multi-disciplinary discharge planning process, led by the attending physician.  Recommendations may be updated based on patient status, additional functional criteria and insurance authorization.    Follow Up Recommendations  Skilled nursing-short term rehab (<3 hours/day)     Assistance Recommended at Discharge Set up Supervision/Assistance  Patient can return home with the following  Two people to help with walking and/or transfers;Two people to help with bathing/dressing/bathroom   Equipment Recommendations  BSC/3in1;Other (comment)    Recommendations for Other Services      Precautions / Restrictions Precautions Precautions: Fall;Back;Other (comment) Precaution Comments: watch BP       Mobility Bed Mobility Overal bed mobility: Needs Assistance Bed Mobility: Rolling, Sidelying to Sit Rolling: Mod assist Sidelying to sit: Mod assist, HOB elevated       General bed mobility  comments: Cues for log roll technique, pt initiating reaching over for rail. Heavy modA and use of bed pad to bring hips into sidelying position, assist for BLE's off edge of bed and trunk to upright    Transfers Overall transfer level: Needs assistance Equipment used: Rolling walker (2 wheels) Transfers: Sit to/from Stand, Bed to chair/wheelchair/BSC Sit to Stand: Mod assist     Step pivot transfers: Mod assist     General transfer comment: slow scissored gait, post. lob x1 what required intervention to correct     Balance                                           ADL either performed or assessed with clinical judgement   ADL Overall ADL's : Needs assistance/impaired                         Toilet Transfer: Moderate assistance;Ambulation;Rolling walker (2 wheels) Toilet Transfer Details (indicate cue type and reason): simulated during in room transfer form eob approx. 6 steps to walker with a turn to the right before sitting down, cues for hand placement and increased time to reach for arm rests and sit with control         Functional mobility during ADLs: Moderate assistance;Cueing for safety;Cueing for sequencing;Rolling walker (2 wheels) General ADL Comments: moves slow, cues required for sequencing and safety. post. lob x1 during ambulation that required therapist asst. support to correct and regain balance    Extremity/Trunk Assessment  Vision       Perception     Praxis      Cognition Arousal/Alertness: Awake/alert Behavior During Therapy: WFL for tasks assessed/performed Overall Cognitive Status: Within Functional Limits for tasks assessed                                 General Comments: Slow processing but could be due to being [facility]. Likely at baseline        Exercises      Shoulder Instructions       General Comments      Pertinent Vitals/ Pain       Pain Assessment Pain Assessment:  No/denies pain  Home Living                                          Prior Functioning/Environment              Frequency  Min 2X/week        Progress Toward Goals  OT Goals(current goals can now be found in the care plan section)  Progress towards OT goals: Progressing toward goals     Plan Discharge plan remains appropriate    Co-evaluation                 AM-PAC OT "6 Clicks" Daily Activity     Outcome Measure   Help from another person eating meals?: A Little Help from another person taking care of personal grooming?: A Little Help from another person toileting, which includes using toliet, bedpan, or urinal?: A Lot Help from another person bathing (including washing, rinsing, drying)?: A Lot Help from another person to put on and taking off regular upper body clothing?: A Little Help from another person to put on and taking off regular lower body clothing?: A Lot 6 Click Score: 15    End of Session Equipment Utilized During Treatment: Rolling walker (2 wheels);Gait belt  OT Visit Diagnosis: Unsteadiness on feet (R26.81);Muscle weakness (generalized) (M62.81)   Activity Tolerance Patient tolerated treatment well   Patient Left in chair;with call bell/phone within reach   Nurse Communication Other (comment) (reviewed updated bps with rn and that pt. was up in the chair)        Time: [id number] OT Time Calculation (min): 19 min  Charges: OT General Charges $OT Visit: 1 Visit OT Treatments $Self Care/Home Management : 8-22 mins  Sonia Baller, COTA/L Acute Rehabilitation [phone]   Clearnce Sorrel [place]-COTA/L 11/04/2022, 11:50 AM

## 2022-11-05 NOTE — NC FL2 (Signed)
[place] LEVEL OF CARE FORM     IDENTIFICATION  Patient Name: Aaron Schmidt Birthdate: 30-Aug-1933 Sex: male Admission Date (Current Location): 11/01/2022  [facility] and [place] Number:  Herbalist and Address:  [facility], [place] [address], [place], [place] [address]      Provider Number: [id number]  Attending Physician Name and Address:  Dawley, Theodoro Doing, DO  Relative Name and Phone Number:  Nikolis, [place] (Spouse)  [phone] (Mobile)    Current Level of Care: Hospital Recommended Level of Care: [place] Prior Approval Number:    Date Approved/Denied:   PASRR Number: [id number]  Discharge Plan: SNF    Current Diagnoses: Patient Active Problem List   Diagnosis Date Noted   Thoracic myelopathy 11/01/2022   Left kidney mass 10/30/2022   History of MI (myocardial infarction) 10/30/2022   Elevated fasting glucose 10/30/2022   Abnormal laboratory test 10/30/2022   Bilateral leg edema 09/26/2022   Mitral regurgitation 01/12/2021   Anemia due to chronic blood loss 01/10/2021   Benign prostatic hyperplasia 01/10/2021   Benign prostatic hyperplasia with lower urinary tract symptoms 01/10/2021   Chronic kidney disease, stage 3 unspecified ([place]) 01/10/2021   Peripheral edema 01/10/2021   Enlarged prostate 01/10/2021   Hardening of the aorta (main artery of the heart) ([place]) 01/10/2021   Hypoalbuminemia 01/10/2021   Impaired fasting glucose 01/10/2021   Iron deficiency anemia 01/10/2021   Kidney stone 01/10/2021   Mitral valve disorder 01/10/2021   Osteopenia 01/10/2021   Other specified disorders of kidney and ureter 01/10/2021   Peripheral neuralgia 01/10/2021   Sciatica 01/10/2021   Personal history of colonic polyps 01/10/2021   Personal history of other diseases of the respiratory system 01/10/2021   Pure hypercholesterolemia 01/10/2021   Thrombocytopenia ([place]) 01/10/2021   Tubular adenoma of colon  01/10/2021   Unspecified abnormal finding in specimens from other organs, systems and tissues 01/10/2021   Edema 01/10/2021   Old myocardial infarction 01/10/2021   Osteoarthrosis 01/10/2021   Intermittent tremor 01/10/2021   Arthritis    History of kidney stones    Hypertension    Parkinson's disease    Occasional tremors 07/26/2020   Chronic bilateral low back pain 07/26/2020   Sensorineural hearing loss (SNHL) of both ears 06/15/2020   Muscle pain 03/01/2020   Degenerative lumbar spinal stenosis 09/22/2019   Body mass index (BMI) 25.0-25.9, adult 08/31/2019   Scoliosis concern 08/31/2019   Lumbar stenosis with neurogenic claudication 08/31/2019   Spinal stenosis, lumbar region without neurogenic claudication 08/19/2019   Hypotension 06/23/2019   Myocarditis ([place]) 05/19/2019   CAD (coronary artery disease) 04/20/2019   Obesity 04/06/2019   Malnutrition of moderate degree 03/23/2019   NSTEMI (non-ST elevated myocardial infarction) ([place]) 03/21/2019   Myocardial infarction ([place]) 03/21/2019   Anemia 09/21/2017   Symptomatic anemia 09/20/2017   Protein-calorie malnutrition, severe 09/11/2017   Empyema lung ([place]) 09/09/2017   SIRS (systemic inflammatory response syndrome) ([place]) 09/05/2017   Elevated troponin 09/05/2017   Normochromic normocytic anemia 09/05/2017   Hyponatremia 09/05/2017   Acute kidney injury superimposed on chronic kidney disease ([place]) 09/05/2017   Chronic pain 09/05/2017   Pleural effusion, left 09/04/2017   Pleural effusion on left 08/19/2017   Sinusitis, chronic 08/19/2017   Fall in home 07/10/2017   Murmur 01/08/2017   Skin ulcer of toe of left foot, limited to breakdown of skin ([place]) 09/04/2016   Displacement of cervical intervertebral disc 07/19/2014   Cervical  myelopathy with cervical radiculopathy (HCC) 06/24/2014   LBBB (left bundle branch block) 06/21/2014   Hyperlipidemia 06/21/2014   Preoperative cardiovascular examination 06/21/2014    Degenerative cervical spinal stenosis 05/26/2014   Cervical spondylosis without myelopathy 05/12/2014   Neck pain 03/03/2014   Shoulder joint pain 12/01/2013   Spinal stenosis of lumbar region 08/10/2013   Chronic low back pain 07/29/2013   Lumbar radiculopathy 07/29/2013   Lumbar post-laminectomy syndrome 07/15/2013   Obstructive sleep apnea 09/30/2010   Sleep apnea 2012   Essential hypertension 08/29/2010    Orientation RESPIRATION BLADDER Height & Weight     Self, Time, Situation, Place  Normal Incontinent, External catheter Weight: 150 lb (68 kg) Height:  5' 10"$  (177.8 cm)  BEHAVIORAL SYMPTOMS/MOOD NEUROLOGICAL BOWEL NUTRITION STATUS      Continent Diet (see d/c summary)  AMBULATORY STATUS COMMUNICATION OF NEEDS Skin   Extensive Assist Verbally Surgical wounds (Incision, back)                       Personal Care Assistance Level of Assistance  Bathing, Feeding, Dressing Bathing Assistance: Limited assistance Feeding assistance: Independent Dressing Assistance: Limited assistance     Functional Limitations Info  Sight, Hearing, Speech Sight Info: Adequate Hearing Info: Impaired Speech Info: Adequate    SPECIAL CARE FACTORS FREQUENCY  OT (By licensed OT), PT (By licensed PT)     PT Frequency: 5x/week OT Frequency: 5x/week            Contractures Contractures Info: Not present    Additional Factors Info  Code Status, Allergies Code Status Info: Full code Allergies Info: Doxazosin Mesylate, baclofen, diazepam           Current Medications (11/05/2022):  This is the current hospital active medication list Current Facility-Administered Medications  Medication Dose Route Frequency Provider Last Rate Last Admin   0.9 %  sodium chloride infusion  250 mL Intravenous Continuous Dawley, Troy C, DO 1 mL/hr at 11/04/22 1200 Infusion Verify at 11/04/22 1200   0.9 %  sodium chloride infusion   Intravenous Continuous Viona Gilmore D, NP 50 mL/hr at 11/05/22  0507 Rate Change at 11/05/22 0507   acetaminophen (TYLENOL) tablet 650 mg  650 mg Oral Q4H PRN Dawley, Troy C, DO   650 mg at 11/05/22 [id number]   Or   acetaminophen (TYLENOL) suppository 650 mg  650 mg Rectal Q4H PRN Dawley, Troy C, DO       bisacodyl (DULCOLAX) suppository 10 mg  10 mg Rectal Daily PRN Viona Gilmore D, NP       carbidopa-levodopa (SINEMET IR) 25-100 MG per tablet immediate release 1 tablet  1 tablet Oral 2 times per day Dawley, Troy C, DO   1 tablet at 11/04/22 1851   carbidopa-levodopa (SINEMET IR) 25-100 MG per tablet immediate release 2 tablet  2 tablet Oral 2 times per day Dawley, Troy C, DO   2 tablet at 11/05/22 0504   Carbidopa-Levodopa ER (SINEMET CR) 25-100 MG tablet controlled release 1 tablet  1 tablet Oral QHS Dawley, Troy C, DO   1 tablet at 11/04/22 2029   docusate sodium (COLACE) capsule 100 mg  100 mg Oral BID Dawley, Troy C, DO   100 mg at 11/05/22 0843   finasteride (PROSCAR) tablet 5 mg  5 mg Oral q AM Dawley, Troy C, DO   5 mg at 11/05/22 0503   furosemide (LASIX) tablet 20 mg  20 mg Oral Q M,W,F Dawley, Troy C, DO  20 mg at 11/05/22 0843   HYDROmorphone (DILAUDID) tablet 2 mg  2 mg Oral Q5H PRN Dawley, Troy C, DO   2 mg at 11/05/22 0843   ketorolac (TORADOL) 15 MG/ML injection 15 mg  15 mg Intravenous Q8H Bergman, Meghan D, NP   15 mg at 11/05/22 0507   lactated ringers infusion   Intravenous Continuous Dawley, Theodoro Doing, DO   New Bag at 11/01/22 1014   menthol-cetylpyridinium (CEPACOL) lozenge 3 mg  1 lozenge Oral PRN Dawley, Troy C, DO       Or   phenol (CHLORASEPTIC) mouth spray 1 spray  1 spray Mouth/Throat PRN Dawley, Troy C, DO       methocarbamol (ROBAXIN) tablet 500 mg  500 mg Oral Q6H PRN Dawley, Troy C, DO   500 mg at 11/04/22 0554   Or   methocarbamol (ROBAXIN) 500 mg in dextrose 5 % 50 mL IVPB  500 mg Intravenous Q6H PRN Dawley, Troy C, DO       nortriptyline (PAMELOR) capsule 25 mg  25 mg Oral QHS Dawley, Troy C, DO   25 mg at 11/04/22 2032    ondansetron (ZOFRAN) tablet 4 mg  4 mg Oral Q6H PRN Dawley, Troy C, DO       Or   ondansetron (ZOFRAN) injection 4 mg  4 mg Intravenous Q6H PRN Dawley, Troy C, DO       oxyCODONE (Oxy IR/ROXICODONE) immediate release tablet 5 mg  5 mg Oral Q3H PRN Dawley, Troy C, DO   5 mg at 11/01/22 1840   polyethylene glycol (MIRALAX / GLYCOLAX) packet 17 g  17 g Oral Daily Bergman, Meghan D, NP   17 g at 11/05/22 0843   pregabalin (LYRICA) capsule 150 mg  150 mg Oral q AM Dawley, Troy C, DO   150 mg at 11/05/22 0503   simvastatin (ZOCOR) tablet 40 mg  40 mg Oral QHS Dawley, Troy C, DO   40 mg at 11/04/22 2029   sodium chloride flush (NS) 0.9 % injection 3 mL  3 mL Intravenous Q12H Dawley, Troy C, DO   3 mL at 11/05/22 0844   sodium chloride flush (NS) 0.9 % injection 3 mL  3 mL Intravenous PRN Dawley, Troy C, DO       sodium phosphate (FLEET) 7-19 GM/118ML enema 1 enema  1 enema Rectal Daily PRN Viona Gilmore D, NP       tamsulosin (FLOMAX) capsule 0.8 mg  0.8 mg Oral q AM Dawley, Troy C, DO   0.8 mg at 11/05/22 0503   tiZANidine (ZANAFLEX) tablet 2 mg  2 mg Oral Q8H Dawley, Troy C, DO   2 mg at 11/05/22 0503   traZODone (DESYREL) tablet 150 mg  150 mg Oral QHS Dawley, Troy C, DO   150 mg at 11/04/22 2030     Discharge Medications: Please see discharge summary for a list of discharge medications.  Relevant Imaging Results:  Relevant Lab Results:   Additional Information SSN: [place] Fate, [place]

## 2022-11-05 NOTE — TOC Progression Note (Signed)
Transition of Care [facility]) - Progression Note    Patient Details  Name: Aaron Schmidt MRN: [id number] Date of Birth: 08-29-1933  Transition of Care [facility]) CM/SW [place], [place] Phone Number: 11/05/2022, 2:56 PM  Clinical Narrative:     Insurance authorization pending  # [id number]  TOC will continue to follow and assist with discharge planning.   Thurmond Butts, MSW, LCSW Clinical Social Worker    Expected Discharge Plan: Skilled Nursing Facility Barriers to Discharge: Ship broker, Continued Medical Work up  Expected Discharge Plan and [place] arrangements for the past 2 months: [place]                                       Social Determinants of Health (SDOH) Interventions SDOH Screenings   Food Insecurity: No Food Insecurity (11/01/2022)  Housing: Low Risk  (11/01/2022)  Transportation Needs: No Transportation Needs (11/01/2022)  Utilities: Not At Risk (11/01/2022)  Tobacco Use: Medium Risk (11/01/2022)    Readmission Risk Interventions     No data to display

## 2022-11-05 NOTE — Care Management Important Message (Signed)
Important Message  Patient Details  Name: Aaron Schmidt MRN: [id number] Date of Birth: 1932/10/08   Medicare Important Message Given:  Yes     [name] Montine Circle 11/05/2022, 2:54 PM

## 2022-11-05 NOTE — Progress Notes (Signed)
Inpatient Rehab Admissions Coordinator:   Consult received and chart reviewed.  PT/OT recommending SNF rehab, and would not be able to get insurance approval with SNF recs.  Pt agreeable to SNF per CSW documentation.  Will sign off at this time.   Shann Medal, PT, DPT Admissions Coordinator [phone] 11/05/22  11:48 AM

## 2022-11-05 NOTE — TOC Initial Note (Addendum)
Transition of Care [facility]) - Initial/Assessment Note    Patient Details  Name: Aaron Schmidt MRN: [id number] Date of Birth: [birth year]  Transition of Care [facility]) CM/SW Contact:    Bethann Berkshire, [place] Phone Number: 11/05/2022, 10:40 AM  Clinical Narrative:                  CSW met with pt and discussed PT rec for short ter rehab at [facility]. Pt lives at [place]. He wants to go to their SNF for rehab. CSW explains medicare coverage and insurance auth process. Pt does not have any additional questions. FL2 completed and referral sent to [facility] in hub. TOC will follow to assist with eventual DC to SNF.   CSW contacted Wilhemena Durie SNF liaison, and is informed she is off until Wednesday. Beatriz Chancellor will be the [facility] contact instead, [phone].   CSW called and left voicemail with [facility] ([facility]) requesting call back.   1335: CSW received callback from [place] is not in network with [facility]. [facility] will check to see if pt's [facility] medicare plan has out of network benefits. If pt's plan doesn't have out of network benefits, [facility] would still be able to accept pt under his "grace days" since pt is a resident at their [place].   1435: Francisco called CSW and explained that pt does not have out of network benefits but spoke further with his admissions staff and [facility] is requesting [facility] initiates SNF auth with pt's [employer] and request a "Gap exceptions." This CSW notified primary 4np CSW who will follow up.    Expected Discharge Plan: Skilled Nursing Facility Barriers to Discharge: Ship broker, Continued Medical Work up   Patient Goals and CMS Choice            Expected Discharge Plan and Services       Living arrangements for the past 2 months: [place]                                      Prior Living Arrangements/Services Living arrangements for the past 2 months: [place] Lives with::  Spouse Patient language and need for interpreter reviewed:: Yes        Need for Family Participation in Patient Care: No (Comment) Care giver support system in place?: Yes (comment)   Criminal Activity/Legal Involvement Pertinent to Current Situation/Hospitalization: No - Comment as needed  Activities of Daily Living Home Assistive Devices/Equipment: Cane (specify quad or straight), Eyeglasses, Hearing aid, Wheelchair ADL Screening (condition at time of admission) Patient's cognitive ability adequate to safely complete daily activities?: Yes Is the patient deaf or have difficulty hearing?: Yes Does the patient have difficulty seeing, even when wearing glasses/contacts?: Yes Does the patient have difficulty concentrating, remembering, or making decisions?: No Patient able to express need for assistance with ADLs?: No Does the patient have difficulty dressing or bathing?: Yes Independently performs ADLs?: Yes (appropriate for developmental age) Does the patient have difficulty walking or climbing stairs?: Yes Weakness of Legs: Both Weakness of Arms/Hands: Both  Permission Sought/Granted                  Emotional Assessment Appearance:: Appears stated age Attitude/Demeanor/Rapport: Engaged Affect (typically observed): Accepting Orientation: : Oriented to Self, Oriented to Place, Oriented to  Time, Oriented to Situation Alcohol / Substance Use: Not Applicable Psych Involvement: No (comment)  Admission diagnosis:  Thoracic  myelopathy [M47.14] Patient Active Problem List   Diagnosis Date Noted   Thoracic myelopathy 11/01/2022   Left kidney mass 10/30/2022   History of MI (myocardial infarction) 10/30/2022   Elevated fasting glucose 10/30/2022   Abnormal laboratory test 10/30/2022   Bilateral leg edema 09/26/2022   Mitral regurgitation 01/12/2021   Anemia due to chronic blood loss 01/10/2021   Benign prostatic hyperplasia 01/10/2021   Benign prostatic hyperplasia with  lower urinary tract symptoms 01/10/2021   Chronic kidney disease, stage 3 unspecified ([place]) 01/10/2021   Peripheral edema 01/10/2021   Enlarged prostate 01/10/2021   Hardening of the aorta (main artery of the heart) ([place]) 01/10/2021   Hypoalbuminemia 01/10/2021   Impaired fasting glucose 01/10/2021   Iron deficiency anemia 01/10/2021   Kidney stone 01/10/2021   Mitral valve disorder 01/10/2021   Osteopenia 01/10/2021   Other specified disorders of kidney and ureter 01/10/2021   Peripheral neuralgia 01/10/2021   Sciatica 01/10/2021   Personal history of colonic polyps 01/10/2021   Personal history of other diseases of the respiratory system 01/10/2021   Pure hypercholesterolemia 01/10/2021   Thrombocytopenia ([place]) 01/10/2021   Tubular adenoma of colon 01/10/2021   Unspecified abnormal finding in specimens from other organs, systems and tissues 01/10/2021   Edema 01/10/2021   Old myocardial infarction 01/10/2021   Osteoarthrosis 01/10/2021   Intermittent tremor 01/10/2021   Arthritis    History of kidney stones    Hypertension    Parkinson's disease    Occasional tremors 07/26/2020   Chronic bilateral low back pain 07/26/2020   Sensorineural hearing loss (SNHL) of both ears 06/15/2020   Muscle pain 03/01/2020   Degenerative lumbar spinal stenosis 09/22/2019   Body mass index (BMI) 25.0-25.9, adult 08/31/2019   Scoliosis concern 08/31/2019   Lumbar stenosis with neurogenic claudication 08/31/2019   Spinal stenosis, lumbar region without neurogenic claudication 08/19/2019   Hypotension 06/23/2019   Myocarditis ([place]) 05/19/2019   CAD (coronary artery disease) 04/20/2019   Obesity 04/06/2019   Malnutrition of moderate degree 03/23/2019   NSTEMI (non-ST elevated myocardial infarction) ([place]) 03/21/2019   Myocardial infarction ([place]) 03/21/2019   Anemia 09/21/2017   Symptomatic anemia 09/20/2017   Protein-calorie malnutrition, severe 09/11/2017   Empyema lung ([place]) 09/09/2017    SIRS (systemic inflammatory response syndrome) ([place]) 09/05/2017   Elevated troponin 09/05/2017   Normochromic normocytic anemia 09/05/2017   Hyponatremia 09/05/2017   Acute kidney injury superimposed on chronic kidney disease ([place]) 09/05/2017   Chronic pain 09/05/2017   Pleural effusion, left 09/04/2017   Pleural effusion on left 08/19/2017   Sinusitis, chronic 08/19/2017   Fall in home 07/10/2017   Murmur 01/08/2017   Skin ulcer of toe of left foot, limited to breakdown of skin ([place]) 09/04/2016   Displacement of cervical intervertebral disc 07/19/2014   Cervical myelopathy with cervical radiculopathy ([place]) 06/24/2014   LBBB (left bundle branch block) 06/21/2014   Hyperlipidemia 06/21/2014   Preoperative cardiovascular examination 06/21/2014   Degenerative cervical spinal stenosis 05/26/2014   Cervical spondylosis without myelopathy 05/12/2014   Neck pain 03/03/2014   Shoulder joint pain 12/01/2013   Spinal stenosis of lumbar region 08/10/2013   Chronic low back pain 07/29/2013   Lumbar radiculopathy 07/29/2013   Lumbar post-laminectomy syndrome 07/15/2013   Obstructive sleep apnea 09/30/2010   Sleep apnea 2012   Essential hypertension 08/29/2010   PCP:  Javier Glazier, MD Pharmacy:   [facility]  [place] [address] Phone: [phone] Fax: [phone]  [place], [place] [address] Phone: [phone] Fax: [place], [place] [address] Phone: [phone] Fax: [phone]     Social Determinants of Health (SDOH) Social History: [place]: No Food Insecurity (11/01/2022)  Housing: Low Risk  (11/01/2022)  Transportation Needs: No Transportation Needs (11/01/2022)  Utilities: Not At Risk (11/01/2022)   Tobacco Use: Medium Risk (11/01/2022)   SDOH Interventions:     Readmission Risk Interventions     No data to display

## 2022-11-05 NOTE — Progress Notes (Signed)
   Providing Compassionate, Quality Care - Together  NEUROSURGERY PROGRESS NOTE   S: No issues overnight.   O: EXAM:  BP 125/69 (BP Location: Left Arm)   Pulse 72   Temp 97.9 F (36.6 C) (Oral)   Resp 14   Ht '5\' 10"'$  (1.778 m)   Wt 68 kg   SpO2 99%   BMI 21.52 kg/m   Awake, alert, oriented x3 PERRL Speech fluent, appropriate  CNs grossly intact  5/5 BUE 4/5 BLE    ASSESSMENT:  89 y.o. male with    T12-L1 large herniated nucleus pulposus with severe stenosis   Status post T12-L1 decompression, extension instrumentation and fusion   PLAN: -PT/Ot-rehab eval, [facility] preference     Thank you for allowing me to participate in this patient's care.  Please do not hesitate to call with questions or concerns.   Elwin Sleight, [facility] Cell: [phone]

## 2022-11-05 NOTE — Progress Notes (Signed)
Physical Therapy Treatment Patient Details Name: Aaron Schmidt MRN: [id number] DOB: 12/09/1932 Today's Date: 11/05/2022   History of Present Illness Pt is an 89 y.o. male who presented 11/01/22 for T12-L1 decompression, extension instrumentation and fusion. PMHx: HTN, CAD, MI, OA, anemia, CKD stage 3, HLD, OSA, Parkinson's disease, peripheral neuralgia    PT Comments    Patient progressing with mobility and able to tolerate in room ambulation today with drop in BP though not 20 mmHg.  Ambulates slowly with shuffling gait and able to tolerate without lightheadedness, though did complain of weakness.  He sat in the chair for some BADL's and needed increased time and some A for L hearing aide after washing his face.  PT will continue to follow.  He remains appropriate for STSNF level rehab at d/c.    Recommendations for follow up therapy are one component of a multi-disciplinary discharge planning process, led by the attending physician.  Recommendations may be updated based on patient status, additional functional criteria and insurance authorization.  Follow Up Recommendations  Skilled nursing-short term rehab (<3 hours/day) Can patient physically be transported by private vehicle: Yes   Assistance Recommended at Discharge    Patient can return home with the following A lot of help with walking and/or transfers;A lot of help with bathing/dressing/bathroom;Assistance with cooking/housework;Assist for transportation   Equipment Recommendations  Other (comment) (defer to next venue)    Recommendations for Other Services       Precautions / Restrictions       Mobility  Bed Mobility Overal bed mobility: Needs Assistance Bed Mobility: Rolling, Sidelying to Sit Rolling: Min guard Sidelying to sit: Min assist       General bed mobility comments: cues for technique, assist for lines and lifting trunk    Transfers Overall transfer level: Needs assistance Equipment used: Rolling walker  (2 wheels) Transfers: Sit to/from Stand Sit to Stand: Mod assist           General transfer comment: assist for anterior weight shift, posterior lean with lifting help to stand (despite cues to scoot closer to EOB to increased anterior weight shift)    Ambulation/Gait Ambulation/Gait assistance: Min assist Gait Distance (Feet): 20 Feet Assistive device: Rolling walker (2 wheels) Gait Pattern/deviations: Step-through pattern, Decreased stride length, Step-to pattern, Shuffle       General Gait Details: short shuffling steps, mild imbalance with heavy UE reliance   Stairs             Wheelchair Mobility    Modified Rankin (Stroke Patients Only)       Balance Overall balance assessment: Needs assistance Sitting-balance support: Feet supported Sitting balance-Leahy Scale: Fair Sitting balance - Comments: sitting without UE support on EOB, seated in chair performing teeth brushing, washing face   Standing balance support: Bilateral upper extremity supported, Reliant on assistive device for balance Standing balance-Leahy Scale: Poor                              Cognition Arousal/Alertness: Awake/alert Behavior During Therapy: Flat affect Overall Cognitive Status: Within Functional Limits for tasks assessed                                 General Comments: Slow processing but could be due to being [facility]. Likely at baseline        Exercises      General  Comments General comments (skin integrity, edema, etc.): VSS with seated SBP 123, standing 108 and mild c/o weakness, no lightheadedness      Pertinent Vitals/Pain Pain Assessment Pain Assessment: 0-10 Pain Score: 8  Pain Location: back Pain Descriptors / Indicators: Discomfort, Grimacing, Operative site guarding Pain Intervention(s): Monitored during session, Premedicated before session, Patient requesting pain meds-RN notified    Home Living                           Prior Function            PT Goals (current goals can now be found in the care plan section) Progress towards PT goals: Progressing toward goals    Frequency    Min 5X/week      PT Plan Current plan remains appropriate    Co-evaluation              AM-PAC PT "6 Clicks" Mobility   Outcome Measure  Help needed turning from your back to your side while in a flat bed without using bedrails?: A Little Help needed moving from lying on your back to sitting on the side of a flat bed without using bedrails?: A Little Help needed moving to and from a bed to a chair (including a wheelchair)?: A Lot Help needed standing up from a chair using your arms (e.g., wheelchair or bedside chair)?: A Lot Help needed to walk in hospital room?: A Lot Help needed climbing 3-5 steps with a railing? : Total 6 Click Score: 13    End of Session Equipment Utilized During Treatment: Gait belt Activity Tolerance: Patient tolerated treatment well Patient left: in chair;with chair alarm set;with call bell/phone within reach   PT Visit Diagnosis: Unsteadiness on feet (R26.81);Other abnormalities of gait and mobility (R26.89);Muscle weakness (generalized) (M62.81);Difficulty in walking, not elsewhere classified (R26.2);Pain Pain - part of body:  (back)     Time: [id number] PT Time Calculation (min) (ACUTE ONLY): 35 min  Charges:  $Gait Training: 8-22 mins $Therapeutic Activity: 8-22 mins                     Magda Kiel, PT Acute Rehabilitation Services Office:[phone] 11/05/2022    Reginia Naas 11/05/2022, 2:00 PM

## 2022-11-06 NOTE — Progress Notes (Signed)
Physical Therapy Treatment Patient Details Name: Aaron Schmidt MRN: [id number] DOB: [birth year] Today's Date: 11/06/2022   History of Present Illness Pt is an 89 y.o. male who presented 11/01/22 for T12-L1 decompression, extension instrumentation and fusion. PMHx: HTN, CAD, MI, OA, anemia, CKD stage 3, HLD, OSA, Parkinson's disease, peripheral neuralgia    PT Comments    Patient moving slowly and with a lot of effort.  He was, however, able to stand from EOB with less assist and less posterior lean today.  Still needing encouragement to push distance a little further, but seemed to tolerate with minimal c/o fatigue.  Blood pressure stable today as well.  PT will continue to follow.    Recommendations for follow up therapy are one component of a multi-disciplinary discharge planning process, led by the attending physician.  Recommendations may be updated based on patient status, additional functional criteria and insurance authorization.  Follow Up Recommendations  Skilled nursing-short term rehab (<3 hours/day) Can patient physically be transported by private vehicle: Yes   Assistance Recommended at Discharge Frequent or constant Supervision/Assistance  Patient can return home with the following A lot of help with bathing/dressing/bathroom;Assistance with cooking/housework;Assist for transportation;A little help with walking and/or transfers   Equipment Recommendations  Other (comment) (TBA)    Recommendations for Other Services       Precautions / Restrictions Precautions Precautions: Fall;Back     Mobility  Bed Mobility Overal bed mobility: Needs Assistance Bed Mobility: Rolling, Sidelying to Sit, Sit to Sidelying Rolling: Min assist Sidelying to sit: Min assist     Sit to sidelying: Min assist General bed mobility comments: assist for trunk to sit, for feet and cues for positioning to supine    Transfers Overall transfer level: Needs assistance Equipment used: Rolling  walker (2 wheels) Transfers: Sit to/from Stand Sit to Stand: Min assist           General transfer comment: cues for anterior weight shift for scooting closer to EOB, (limited due to felt scooted onto testicles), but min A to rise with mild elevation to bed    Ambulation/Gait Ambulation/Gait assistance: Min assist Gait Distance (Feet): 40 Feet Assistive device: Rolling walker (2 wheels) Gait Pattern/deviations: Step-through pattern, Step-to pattern, Decreased stride length, Trunk flexed, Decreased dorsiflexion - right       General Gait Details: short shuffling steps, cues for forward gaze and upright posture as much as tolerated, increased time and very short steps with turns   Chief Strategy Officer    Modified Rankin (Stroke Patients Only)       Balance Overall balance assessment: Needs assistance   Sitting balance-Leahy Scale: Fair     Standing balance support: Bilateral upper extremity supported, Reliant on assistive device for balance Standing balance-Leahy Scale: Poor                              Cognition Arousal/Alertness: Awake/alert Behavior During Therapy: Flat affect Overall Cognitive Status: Within Functional Limits for tasks assessed                                          Exercises      General Comments General comments (skin integrity, edema, etc.): Seated BP 125/57, denied symptoms of lightheadedness      Pertinent  Vitals/Pain Pain Assessment Faces Pain Scale: Hurts even more Pain Location: back Pain Descriptors / Indicators: Discomfort, Grimacing, Operative site guarding Pain Intervention(s): Monitored during session, Repositioned    Home Living                          Prior Function            PT Goals (current goals can now be found in the care plan section) Progress towards PT goals: Progressing toward goals    Frequency    Min 5X/week      PT Plan  Current plan remains appropriate    Co-evaluation              AM-PAC PT "6 Clicks" Mobility   Outcome Measure  Help needed turning from your back to your side while in a flat bed without using bedrails?: A Little Help needed moving from lying on your back to sitting on the side of a flat bed without using bedrails?: A Little Help needed moving to and from a bed to a chair (including a wheelchair)?: A Little Help needed standing up from a chair using your arms (e.g., wheelchair or bedside chair)?: A Little Help needed to walk in hospital room?: A Little Help needed climbing 3-5 steps with a railing? : Total 6 Click Score: 16    End of Session Equipment Utilized During Treatment: Gait belt Activity Tolerance: Patient tolerated treatment well Patient left: in bed;with call bell/phone within reach;with family/visitor present   PT Visit Diagnosis: Unsteadiness on feet (R26.81);Other abnormalities of gait and mobility (R26.89);Muscle weakness (generalized) (M62.81);Difficulty in walking, not elsewhere classified (R26.2);Pain Pain - part of body:  (back)     Time: [id number] PT Time Calculation (min) (ACUTE ONLY): 27 min  Charges:  $Gait Training: 8-22 mins $Therapeutic Activity: 8-22 mins                     Aaron Schmidt, PT Acute Rehabilitation Services [phone] 11/06/2022    Reginia Naas 11/06/2022, 5:23 PM

## 2022-11-06 NOTE — TOC Progression Note (Signed)
Transition of Care [facility]) - Progression Note    Patient Details  Name: Aaron Schmidt MRN: [id number] Date of Birth: 10-27-1932  Transition [facility] Care [facility]) CM/SW Contact  Vinie Sill, [place] Phone Number: 11/06/2022, 1:50 PM  Clinical Narrative:     CSW informed [facility]- anticipate discharge tomorrow.   Thurmond Butts, MSW, LCSW Clinical Social Worker    Expected Discharge Plan: Skilled Nursing Facility Barriers to Discharge: Continued Medical Work up (patient can admit to SNF tomorrow)  Expected Discharge Plan and Services       Living arrangements for the past 2 months: [place]                                       Social Determinants of Health (SDOH) Interventions SDOH Screenings   Food Insecurity: No Food Insecurity (11/01/2022)  Housing: Low Risk  (11/01/2022)  Transportation Needs: No Transportation Needs (11/01/2022)  Utilities: Not At Risk (11/01/2022)  Tobacco Use: Medium Risk (11/01/2022)    Readmission Risk Interventions     No data to display

## 2022-11-06 NOTE — Progress Notes (Signed)
   Providing Compassionate, Quality Care - Together  NEUROSURGERY PROGRESS NOTE   S: No issues overnight. Had BM w enema  O: EXAM:  BP 127/62 (BP Location: Left Arm)   Pulse 71   Temp 98.1 F (36.7 C) (Oral)   Resp 17   Ht '5\' 10"'$  (1.778 m)   Wt 68 kg   SpO2 95%   BMI 21.52 kg/m   Awake, alert, oriented x3 PERRL Speech fluent, appropriate  CNs grossly intact  5/5 BUE 4/5 BLE    ASSESSMENT:  89 y.o. male with    T12-L1 large herniated nucleus pulposus with severe stenosis   Status post T12-L1 decompression, extension instrumentation and fusion   PLAN: -SNF placement tomorrow     Thank you for allowing me to participate in this patient's care.  Please do not hesitate to call with questions or concerns.   Elwin Sleight, [facility] Cell: [phone]

## 2022-11-06 NOTE — TOC Progression Note (Signed)
Transition of Care [facility]) - Progression Note    Patient Details  Name: Aaron Schmidt MRN: [id number] Date of Birth: 1933/07/05  Transition of Care [facility]) CM/SW [place], [place] Phone Number: 11/06/2022, 11:29 AM  Clinical Narrative:     Damien Fusi authorization for SNF # [id number]  Reference # [id number] from 02/13-02/15  Thurmond Butts, MSW, LCSW Clinical Social Worker    Expected Discharge Plan: Skilled Nursing Facility Barriers to Discharge: Insurance Authorization, Continued Medical Work up  Expected Discharge Plan and [place] arrangements for the past 2 months: [place]                                       Social Determinants of Health (SDOH) Interventions SDOH Screenings   Food Insecurity: No Food Insecurity (11/01/2022)  Housing: Low Risk  (11/01/2022)  Transportation Needs: No Transportation Needs (11/01/2022)  Utilities: Not At Risk (11/01/2022)  Tobacco Use: Medium Risk (11/01/2022)    Readmission Risk Interventions     No data to display

## 2022-11-06 NOTE — Progress Notes (Signed)
Mobility Specialist Progress Note    11/06/22 1235  Mobility  Activity Transferred to/from [facility];Ambulated with assistance in room  Level of Assistance Moderate assist, patient does 50-74%  Assistive Device Front wheel walker  Distance Ambulated (ft) 5 ft  Activity Response Tolerated fair  Mobility Referral Yes  $Mobility charge 1 Mobility   Pre-Mobility: 71 HR, 127/67 (86) BP, 95% SpO2 Post-Mobility: 81 HR, 97% SpO2  Pt received in bed and agreeable. Pt had liquid BM on BSC. Pt c/o feeling off balance and required assist to correct posterior lean. Left in chair with call bell in reach and chair alarm on.   Hildred Alamin Mobility Specialist  Please Contact via SecureChat or Rehab Office at [phone]

## 2022-11-07 MED ORDER — HYDROMORPHONE HCL 2 MG PO TABS: 2.0000 mg | ORAL_TABLET | ORAL | 0 refills | Status: DC | PRN

## 2022-11-07 MED ORDER — ASPIRIN EC 81 MG PO TBEC: 81.0000 mg | DELAYED_RELEASE_TABLET | Freq: Every day | ORAL | 11 refills | Status: DC

## 2022-11-07 MED ORDER — OXYCODONE HCL 5 MG PO TABS: 5.0000 mg | ORAL_TABLET | Freq: Four times a day (QID) | ORAL | 0 refills | Status: DC | PRN

## 2022-11-07 MED ORDER — METHOCARBAMOL 500 MG PO TABS: 500.0000 mg | ORAL_TABLET | Freq: Four times a day (QID) | ORAL | 2 refills | Status: DC | PRN

## 2022-11-07 NOTE — Progress Notes (Signed)
Attempted to call report to [place]  3 times.  No one available to take report at this time. Name and contact information left with Izora Gala. PTAR on unit to transport patient. Wife at bedside and aware of discharge plan.  Discharge paper work place in packet  and given to [employer].

## 2022-11-07 NOTE — TOC Transition Note (Signed)
Transition of Care [facility]) - CM/SW Discharge Note   Patient Details  Name: Aaron Schmidt MRN: [id number] Date of Birth: 10-16-1932  Transition of Care [facility]) CM/SW Contact:  Vinie Sill, LCSW Phone Number: 11/07/2022, 12:09 PM   Clinical Narrative:     Patient will Discharge to: [facility] Discharge Date: 11/07/22 Family Notified: LVM for spouse Transport By: Corey Harold  Per MD patient is ready for discharge. RN, patient, and facility notified of discharge. Discharge Summary sent to facility. RN given number for [phone], Room 104. Ambulance transport requested for patient.   Clinical Social Worker signing off.  Thurmond Butts, MSW, LCSW Clinical Social Worker    Final next level of care: Skilled Nursing Facility Barriers to Discharge: Barriers Resolved   Patient Goals and CMS Choice      Discharge Placement                Patient chooses bed at:  [facility]) Patient to be transferred to facility by: [place] Name of family member notified: left voice message for spouse Patient and family notified of of transfer: 11/07/22  Discharge Plan and Services Additional resources added to the After Visit Summary for                                       Social Determinants of Health (SDOH) Interventions SDOH Screenings   Food Insecurity: No Food Insecurity (11/01/2022)  Housing: Low Risk  (11/01/2022)  Transportation Needs: No Transportation Needs (11/01/2022)  Utilities: Not At Risk (11/01/2022)  Tobacco Use: Medium Risk (11/01/2022)     Readmission Risk Interventions     No data to display

## 2022-11-07 NOTE — Discharge Summary (Signed)
Physician Discharge Summary  Patient ID: Aaron Schmidt MRN: [id number] DOB/AGE: [birth year] 89 y.o.  Admit date: 11/01/2022 Discharge date: 11/07/2022  Admission Diagnoses:  T12-L1 HNP with cord compression  Discharge Diagnoses:  Same Principal Problem:   Thoracic myelopathy   Discharged Condition: Stable  Hospital Course:  Aaron Schmidt is a 89 y.o. male Who underwent T12-L1 decompression, diskectomy, and extension fusion T12-L1 for severe thoracic stenosis.  He tolerated the surgery well.  Postoperatively he was monitored and had a normal postoperative course.  PT/OT evaluated him and recommended rehab placement at a nursing facility.  He was improving appropriately, able to stand and walk further.  He was having normal bowel bladder function, tolerating a normal diet.  His pain was controlled on oral medication.  Treatments: Surgery -   T12-L1 decompression, diskectomy, extension fusion to T12  Discharge Exam: Blood pressure 132/83, pulse 83, temperature 98.1 F (36.7 C), temperature source Oral, resp. rate 17, height 5' 10"$  (1.778 m), weight 68 kg, SpO2 93 %. Awake, alert, oriented Speech fluent, appropriate CN grossly intact 5/5 BUE 4+/5 BLE Wound c/d/i  Disposition: Discharge disposition: 03-Skilled Nursing Facility       Discharge Instructions     Incentive spirometry RT   Complete by: As directed       Allergies as of 11/07/2022       Reactions   Baclofen Other (See Comments)   CONFUSION, BODY TREMORS   Doxazosin Mesylate Other (See Comments)   Other reaction(s): low BP   Diazepam Other (See Comments)   "makes goofy" Other reaction(s): confusion        Medication List     TAKE these medications    ascorbic acid 500 MG tablet Commonly known as: VITAMIN C Take 500 mg by mouth in the morning.   aspirin EC 81 MG tablet Take 1 tablet (81 mg total) by mouth daily. Start taking on: November 16, 2022 What changed: These instructions start on  November 16, 2022. If you are unsure what to do until then, ask your doctor or other care provider.   CAL MAG ZINC +D3 PO Take 1 tablet by mouth in the morning.   Carbidopa-Levodopa ER 25-100 MG tablet controlled release Commonly known as: SINEMET CR Take 1 tablet by mouth at bedtime. What changed: Another medication with the same name was changed. Make sure you understand how and when to take each.   carbidopa-levodopa 25-100 MG tablet Commonly known as: SINEMET IR 2 tablets at 8am/2 tablets at noon/2 at 4pm What changed:  how much to take how to take this when to take this additional instructions   cyanocobalamin 1000 MCG tablet Commonly known as: VITAMIN B12 Take 1,000 mcg by mouth in the morning.   docusate sodium 100 MG capsule Commonly known as: COLACE Take 600 mg by mouth in the morning.   finasteride 5 MG tablet Commonly known as: PROSCAR Take 5 mg by mouth in the morning.   fluocinonide cream 0.05 % Commonly known as: LIDEX Apply 1 application topically daily as needed (itching).   furosemide 20 MG tablet Commonly known as: LASIX Take 20 mg by mouth every Monday, Wednesday, and Friday. In the morning   HYDROmorphone 2 MG tablet Commonly known as: DILAUDID Take 1 tablet (2 mg total) by mouth every 5 (five) hours as needed for moderate pain or severe pain.   L-LYSINE PO Take 1 capsule by mouth in the morning.   losartan 25 MG tablet Commonly known as: COZAAR  Take 1 tablet (25 mg total) by mouth daily.   Lubricant Eye Drops 0.4-0.3 % Soln Generic drug: Polyethyl Glycol-Propyl Glycol Place 1-2 drops into both eyes 3 (three) times daily as needed (dry/irritated eyes.).   methocarbamol 500 MG tablet Commonly known as: ROBAXIN Take 1 tablet (500 mg total) by mouth every 6 (six) hours as needed for muscle spasms.   mineral oil liquid Take 90 mLs by mouth daily in the afternoon. 6 tablespoons in the afternoon.   multivitamin with minerals Tabs tablet Take  1 tablet by mouth in the morning. Mature mens 50+   nortriptyline 25 MG capsule Commonly known as: PAMELOR Take 25 mg by mouth at bedtime.   oxyCODONE 5 MG immediate release tablet Commonly known as: Oxy IR/ROXICODONE Take 1 tablet (5 mg total) by mouth every 6 (six) hours as needed for severe pain ((score 4 to 6)).   pregabalin 150 MG capsule Commonly known as: LYRICA Take 150 mg by mouth in the morning.   pyridOXINE 100 MG tablet Commonly known as: VITAMIN B6 Take 100 mg by mouth in the morning.   Sennosides 15 MG Tabs Take 45 mg by mouth in the morning. Perdiem Stimulant Laxative Tablets   simvastatin 40 MG tablet Commonly known as: ZOCOR Take 40 mg by mouth at bedtime.   SUPER B COMPLEX/C PO Take 1 capsule by mouth in the morning.   tamsulosin 0.4 MG Caps capsule Commonly known as: FLOMAX Take 0.8 mg by mouth in the morning.   tiZANidine 2 MG tablet Commonly known as: ZANAFLEX Take 2 mg by mouth every 8 (eight) hours.   traZODone 150 MG tablet Commonly known as: DESYREL Take 150 mg by mouth at bedtime.   VITAMIN D PO Take 2,000 Units by mouth in the morning.        Follow-up Information     Mihika Surrette C, DO Follow up in 2 week(s).   Contact information: [address] [place] [address] [phone]                 Signed: Theodoro Doing Chancy Smigiel 11/07/2022, 11:03 AM

## 2022-11-07 NOTE — Progress Notes (Signed)
Mobility Specialist Progress Note    11/07/22 0953  Mobility  Activity Ambulated with assistance in hallway  Level of Assistance +2 (takes two people) (chair follow)  Assistive Device Front wheel walker  Distance Ambulated (ft) 290 ft (145+145)  Activity Response Tolerated well  Mobility Referral Yes  $Mobility charge 1 Mobility   Pre-Mobility: 72 HR, 110/57 (72) BP, 95% SpO2 During Mobility: 89 HR Post-Mobility: 81 HR, 122/56 (76) BP, 92% SpO2  Pt received in bed and agreeable. C/o 10/10 pain that decreased to 8/10 by the end of the session. Pt modA+1 to stand from EOB. Took x1 seated rest break. Pt minA+1 to stand from chair. Left in chair with call bell in reach.   Hildred Alamin Mobility Specialist  Please Psychologist, sport and exercise or Rehab Office at [phone]

## 2022-11-30 ENCOUNTER — Emergency Department (HOSPITAL_COMMUNITY)
Admission: EM | Admit: 2022-11-30 | Discharge: 2022-11-30 | Disposition: A | Discharge status: Home / Self Care | Payer: Medicare Other | Attending: Emergency Medicine | Admitting: Emergency Medicine

## 2022-11-30 ENCOUNTER — Other Ambulatory Visit: Payer: Self-pay

## 2022-11-30 ENCOUNTER — Encounter (HOSPITAL_COMMUNITY): Payer: Self-pay

## 2022-11-30 DIAGNOSIS — M545 Low back pain, unspecified: Secondary | ICD-10-CM | POA: Diagnosis not present

## 2022-11-30 DIAGNOSIS — Z7982 Long term (current) use of aspirin: Secondary | ICD-10-CM | POA: Insufficient documentation

## 2022-11-30 DIAGNOSIS — R531 Weakness: Secondary | ICD-10-CM | POA: Diagnosis present

## 2022-11-30 DIAGNOSIS — Z8739 Personal history of other diseases of the musculoskeletal system and connective tissue: Secondary | ICD-10-CM | POA: Insufficient documentation

## 2022-11-30 DIAGNOSIS — E876 Hypokalemia: Secondary | ICD-10-CM | POA: Insufficient documentation

## 2022-11-30 DIAGNOSIS — Z8669 Personal history of other diseases of the nervous system and sense organs: Secondary | ICD-10-CM

## 2022-11-30 DIAGNOSIS — G20C Parkinsonism, unspecified: Secondary | ICD-10-CM | POA: Insufficient documentation

## 2022-11-30 LAB — CBC
HCT: 31.5 % — ABNORMAL LOW (ref 39.0–52.0)
Hemoglobin: 10.3 g/dL — ABNORMAL LOW (ref 13.0–17.0)
MCH: 28.9 pg (ref 26.0–34.0)
MCHC: 32.7 g/dL (ref 30.0–36.0)
MCV: 88.5 fL (ref 80.0–100.0)
Platelets: 225 10*3/uL (ref 150–400)
RBC: 3.56 MIL/uL — ABNORMAL LOW (ref 4.22–5.81)
RDW: 13.1 % (ref 11.5–15.5)
WBC: 9.5 10*3/uL (ref 4.0–10.5)
nRBC: 0 % (ref 0.0–0.2)

## 2022-11-30 LAB — URINALYSIS, ROUTINE W REFLEX MICROSCOPIC
Bacteria, UA: NONE SEEN
Bilirubin Urine: NEGATIVE
Glucose, UA: NEGATIVE mg/dL
Hgb urine dipstick: NEGATIVE
Ketones, ur: 20 mg/dL — AB
Leukocytes,Ua: NEGATIVE
Nitrite: NEGATIVE
Protein, ur: 30 mg/dL — AB
Specific Gravity, Urine: 1.019 (ref 1.005–1.030)
pH: 5 (ref 5.0–8.0)

## 2022-11-30 LAB — BASIC METABOLIC PANEL
Anion gap: 11 (ref 5–15)
BUN: 19 mg/dL (ref 8–23)
CO2: 25 mmol/L (ref 22–32)
Calcium: 9.2 mg/dL (ref 8.9–10.3)
Chloride: 104 mmol/L (ref 98–111)
Creatinine, Ser: 0.85 mg/dL (ref 0.61–1.24)
GFR, Estimated: >60 mL/min (ref >60)
Glucose, Bld: 107 mg/dL — ABNORMAL HIGH (ref 70–99)
Potassium: 3.4 mmol/L — ABNORMAL LOW (ref 3.5–5.1)
Sodium: 140 mmol/L (ref 135–145)

## 2022-11-30 MED ORDER — POTASSIUM CHLORIDE CRYS ER 20 MEQ PO TBCR
40.0000 meq | EXTENDED_RELEASE_TABLET | Freq: Once | ORAL | Status: AC
  Administered 2022-11-30: 40 meq via ORAL
  Filled 2022-11-30: qty 2

## 2022-11-30 MED ORDER — SODIUM CHLORIDE 0.9 % IV BOLUS
500.0000 mL | Freq: Once | INTRAVENOUS | Status: AC
  Administered 2022-11-30: 500 mL via INTRAVENOUS

## 2022-11-30 MED ORDER — OXYCODONE-ACETAMINOPHEN 5-325 MG PO TABS
1.0000 | ORAL_TABLET | Freq: Once | ORAL | Status: AC
  Administered 2022-11-30: 1 via ORAL
  Filled 2022-11-30: qty 1

## 2022-11-30 NOTE — ED Triage Notes (Signed)
Pt BIB [facility] from [facility] independent facility d/t generalized weakness that  started today. Pt has parkinson at baseline, but is usually able to ambulate independently. Today, pt experiences leg spasms and is unable to ambulate independently by  himself. Pt has chronic back pain. A&O X4.

## 2022-11-30 NOTE — ED Provider Notes (Signed)
[facility] Provider Note   CSN: [id number] Arrival date & time: 11/30/22  1207     History  Chief Complaint  Patient presents with   Weakness    Aaron Schmidt is a 89 y.o. male.  Patient with hx Parkinsons, chronic back pain, c/o spasms of pain to low back and leg area worse w movements and attempting to get up and walk. Denies recent trauma or fall. No saddle area or leg numbness. No weakness. No problems w normal bowel and bladder function. Denies fever or chills. No dysuria or hematuria. No abd or pelvic pain.   The history is provided by the patient, medical records and the EMS personnel.  Weakness Associated symptoms: no chest pain, no dysuria, no fever, no shortness of breath and no vomiting        Home Medications Prior to Admission medications   Medication Sig Start Date End Date Taking? Authorizing Provider  aspirin EC 81 MG tablet Take 1 tablet (81 mg total) by mouth daily. 11/16/22   Dawley, Troy C, DO  carbidopa-levodopa (SINEMET IR) 25-100 MG tablet 2 tablets at 8am/2 tablets at noon/2 at 4pm Patient taking differently: Take 1-2 tablets by mouth See admin instructions. Take 2 tablets by mouth at 0700, take 2 tablets by mouth at 1100, take 1 tablet by mouth at 1600 & take 1 tablet by at 1900. 09/03/22   Tat, Eustace Quail, DO  Carbidopa-Levodopa ER (SINEMET CR) 25-100 MG tablet controlled release Take 1 tablet by mouth at bedtime. 09/03/22   Tat, Eustace Quail, DO  docusate sodium (COLACE) 100 MG capsule Take 600 mg by mouth in the morning.    [provider]  finasteride (PROSCAR) 5 MG tablet Take 5 mg by mouth in the morning.    [provider]  fluocinonide cream (LIDEX) [id number] % Apply 1 application topically daily as needed (itching).    [provider]  furosemide (LASIX) 20 MG tablet Take 20 mg by mouth every Monday, Wednesday, and Friday. In the morning 09/26/22   [provider]   HYDROmorphone (DILAUDID) 2 MG tablet Take 1 tablet (2 mg total) by mouth every 5 (five) hours as needed for moderate pain or severe pain. 11/07/22   Dawley, Troy C, DO  L-LYSINE PO Take 1 capsule by mouth in the morning.    [provider]  losartan (COZAAR) 25 MG tablet Take 1 tablet (25 mg total) by mouth daily. 10/31/22   Park Liter, MD  methocarbamol (ROBAXIN) 500 MG tablet Take 1 tablet (500 mg total) by mouth every 6 (six) hours as needed for muscle spasms. 11/07/22   Dawley, Troy C, DO  mineral oil liquid Take 90 mLs by mouth daily in the afternoon. 6 tablespoons in the afternoon.    [provider]  Multiple Minerals-Vitamins (CAL MAG ZINC +D3 PO) Take 1 tablet by mouth in the morning.    [provider]  Multiple Vitamin (MULTIVITAMIN WITH MINERALS) TABS tablet Take 1 tablet by mouth in the morning. Mature mens 50+    [provider]  nortriptyline (PAMELOR) 25 MG capsule Take 25 mg by mouth at bedtime.     [provider]  oxyCODONE (OXY IR/ROXICODONE) 5 MG immediate release tablet Take 1 tablet (5 mg total) by mouth every 6 (six) hours as needed for severe pain ((score 4 to 6)). 11/07/22   Dawley, Troy C, DO  Polyethyl Glycol-Propyl Glycol (LUBRICANT EYE DROPS) 0.4-0.3 %  SOLN Place 1-2 drops into both eyes 3 (three) times daily as needed (dry/irritated eyes.).    [provider]  pregabalin (LYRICA) 150 MG capsule Take 150 mg by mouth in the morning.    [provider]  pyridOXINE (VITAMIN B-6) 100 MG tablet Take 100 mg by mouth in the morning.    [provider]  Sennosides 15 MG TABS Take 45 mg by mouth in the morning. Perdiem Stimulant Laxative Tablets    [provider]  simvastatin (ZOCOR) 40 MG tablet Take 40 mg by mouth at bedtime.    [provider]  SUPER B COMPLEX/C PO Take 1 capsule by mouth in the morning.    [provider]  tamsulosin (FLOMAX) 0.4 MG CAPS capsule Take 0.8  mg by mouth in the morning.    [provider]  tiZANidine (ZANAFLEX) 2 MG tablet Take 2 mg by mouth every 8 (eight) hours.    [provider]  traZODone (DESYREL) 150 MG tablet Take 150 mg by mouth at bedtime.    [provider]  vitamin B-12 (CYANOCOBALAMIN) 1000 MCG tablet Take 1,000 mcg by mouth in the morning.    [provider]  vitamin C (ASCORBIC ACID) 500 MG tablet Take 500 mg by mouth in the morning.    [provider]  VITAMIN D PO Take 2,000 Units by mouth in the morning.    [provider]      Allergies    Baclofen, Doxazosin mesylate, and Diazepam    Review of Systems   Review of Systems  Constitutional:  Negative for chills and fever.  HENT:  Negative for sore throat.   Respiratory:  Negative for shortness of breath.   Cardiovascular:  Negative for chest pain.  Gastrointestinal:  Negative for vomiting.  Genitourinary:  Negative for dysuria.  Musculoskeletal:  Positive for back pain. Negative for neck pain.  Skin:  Negative for rash.  Neurological:  Positive for weakness. Negative for numbness.    Physical Exam Updated Vital Signs BP (!) 142/75   Pulse 74   Temp 98.2 F (36.8 C) (Oral)   Resp 15   SpO2 100%  Physical Exam Vitals and nursing note reviewed.  Constitutional:      Appearance: Normal appearance. He is well-developed.  HENT:     Head: Atraumatic.     Nose: Nose normal.     Mouth/Throat:     Mouth: Mucous membranes are moist.  Eyes:     General: No scleral icterus.    Conjunctiva/sclera: Conjunctivae normal.  Neck:     Trachea: No tracheal deviation.  Cardiovascular:     Rate and Rhythm: Normal rate and regular rhythm.     Pulses: Normal pulses.     Heart sounds: Normal heart sounds. No murmur heard.    No friction rub. No gallop.  Pulmonary:     Effort: Pulmonary effort is normal. No accessory muscle usage or respiratory distress.     Breath sounds: Normal breath sounds.  Abdominal:      General: Bowel sounds are normal. There is no distension.     Palpations: Abdomen is soft. There is no mass.     Tenderness: There is no abdominal tenderness.  Genitourinary:    Comments: No cva tenderness. Musculoskeletal:        General: No swelling.     Cervical back: Neck supple.     Comments: T/L/S spine non tender, aligned. Lumbar muscular tenderness.   Skin:  General: Skin is warm and dry.     Findings: No rash.  Neurological:     Mental Status: He is alert.     Comments: Alert, speech clear. Motor/sens grossly intact bil. Stre 5/5.   Psychiatric:        Mood and Affect: Mood normal.     ED Results / Procedures / Treatments   Labs (all labs ordered are listed, but only abnormal results are displayed) Results for orders placed or performed during the hospital encounter of 11/30/22  CBC  Result Value Ref Range   WBC 9.5 4.0 - 10.5 K/uL   RBC 3.56 (L) 4.22 - 5.81 MIL/uL   Hemoglobin 10.3 (L) 13.0 - 17.0 g/dL   HCT 31.5 (L) 39.0 - 52.0 %   MCV 88.5 80.0 - 100.0 fL   MCH 28.9 26.0 - 34.0 pg   MCHC 32.7 30.0 - 36.0 g/dL   RDW 13.1 11.5 - 15.5 %   Platelets 225 150 - 400 K/uL   nRBC 0.0 0.0 - 0.2 %  Basic metabolic panel  Result Value Ref Range   Sodium 140 135 - 145 mmol/L   Potassium 3.4 (L) 3.5 - 5.1 mmol/L   Chloride 104 98 - 111 mmol/L   CO2 25 22 - 32 mmol/L   Glucose, Bld 107 (H) 70 - 99 mg/dL   BUN 19 8 - 23 mg/dL   Creatinine, Ser 0.85 0.61 - 1.24 mg/dL   Calcium 9.2 8.9 - 10.3 mg/dL   GFR, Estimated >60 >60 mL/min   Anion gap 11 5 - 15  Urinalysis, Routine w reflex microscopic -Urine, Clean Catch  Result Value Ref Range   Color, Urine YELLOW YELLOW   APPearance CLEAR CLEAR   Specific Gravity, Urine 1.019 1.005 - 1.030   pH 5.0 5.0 - 8.0   Glucose, UA NEGATIVE NEGATIVE mg/dL   Hgb urine dipstick NEGATIVE NEGATIVE   Bilirubin Urine NEGATIVE NEGATIVE   Ketones, ur 20 (A) NEGATIVE mg/dL   Protein, ur 30 (A) NEGATIVE mg/dL   Nitrite NEGATIVE  NEGATIVE   Leukocytes,Ua NEGATIVE NEGATIVE   RBC / HPF 0-5 0 - 5 RBC/hpf   WBC, UA 0-5 0 - 5 WBC/hpf   Bacteria, UA NONE SEEN NONE SEEN   Squamous Epithelial / HPF 0-5 0 - 5 /HPF   Mucus PRESENT    Ca Oxalate Crys, UA PRESENT    DG THORACOLUMABAR SPINE  Result Date: 11/01/2022 CLINICAL DATA:  Posterior fusion of T12-L1. EXAM: THORACOLUMBAR SPINE 1V COMPARISON:  August 07, 2022. FINDINGS: Single intraoperative fluoroscopic image of thoracolumbar spine was noted. This demonstrates the patient be interval surgical posterior fusion of T12-L1. IMPRESSION: Fluoroscopic guidance provided during surgical posterior fusion of T12-L1. Electronically Signed   By: Marijo Conception M.D.   On: 11/01/2022 10:35   DG C-Arm 1-60 Min-No Report  Result Date: 11/01/2022 Fluoroscopy was utilized by the requesting physician.  No radiographic interpretation.   DG C-Arm 1-60 Min-No Report  Result Date: 11/01/2022 Fluoroscopy was utilized by the requesting physician.  No radiographic interpretation.   DG C-Arm 1-60 Min-No Report  Result Date: 11/01/2022 Fluoroscopy was utilized by the requesting physician.  No radiographic interpretation.     EKG None  Radiology No results found.  Procedures Procedures    Medications Ordered in ED Medications  oxyCODONE-acetaminophen (PERCOCET/ROXICET) 5-325 MG per tablet 1 tablet (1 tablet Oral Given 11/30/22 1236)  potassium chloride SA (KLOR-CON M) CR tablet 40 mEq (40 mEq Oral Given 11/30/22 1407)  ED Course/ Medical Decision Making/ A&P                             Medical Decision Making Problems Addressed: Bilateral low back pain without sciatica, unspecified chronicity: acute illness or injury    Details: Acute on chronic Generalized weakness: acute illness or injury with systemic symptoms that poses a threat to life or bodily functions History of degenerative disc disease: chronic illness or injury History of Parkinson's disease: chronic illness or  injury with exacerbation, progression, or side effects of treatment that poses a threat to life or bodily functions History of spinal stenosis: chronic illness or injury Hypokalemia: acute illness or injury  Amount and/or Complexity of Data Reviewed External Data Reviewed: notes. Labs: ordered. Decision-making details documented in ED Course.  Risk Prescription drug management. Decision regarding hospitalization.   Iv ns. Continuous pulse ox and cardiac monitoring. Labs ordered/sent.   Differential diagnosis includes anemia, dehydration, uti, muscle spasm/pain, lumbar pain,  radicular pain . Dispo decision including potential need for admission considered - will get labs and reassess.   Reviewed nursing notes and prior charts for additional history. External reports reviewed. Additional history from: EMS.   Cardiac monitor: sinus rhythm, rate 84.  Labs reviewed/interpreted by me - wbc normal. Hgb c/w prior. Chem normal w exc k sl low. Kcl po. Ua neg for uti.    Po fluids/food.   Recheck pt resting comfortably, no distress.   Pt currently appears comfortable and stable for d/c.   Rec pcp f/u.  Return precautions provided.              Final Clinical Impression(s) / ED Diagnoses Final diagnoses:  None    Rx / DC Orders ED Discharge Orders     None         Lajean Saver, MD 11/30/22 1500

## 2022-11-30 NOTE — Discharge Instructions (Addendum)
It was our pleasure to provide your ER care today - we hope that you feel better.  Make sure to drink plenty of fluids and stay well hydrated.  Take your pain medication as need.   Overall your lab tests look good/normal - your potassium level is slightly low - eat plenty of fruits and vegetables, and follow up with your doctor.   Follow up closely with your doctor in the coming week.  Return to ER if worse, new symptoms, fevers, new/severe pain, chest pain, trouble breathing, or other emergency concern.

## 2022-12-20 ENCOUNTER — Other Ambulatory Visit (HOSPITAL_BASED_OUTPATIENT_CLINIC_OR_DEPARTMENT_OTHER): Payer: Self-pay | Admitting: Neurological Surgery

## 2022-12-20 DIAGNOSIS — M4804 Spinal stenosis, thoracic region: Secondary | ICD-10-CM

## 2022-12-24 ENCOUNTER — Ambulatory Visit (HOSPITAL_BASED_OUTPATIENT_CLINIC_OR_DEPARTMENT_OTHER)
Admission: RE | Admit: 2022-12-24 | Discharge: 2022-12-24 | Disposition: A | Discharge status: Home / Self Care | Payer: Medicare Other | Source: Ambulatory Visit | Attending: Neurological Surgery | Admitting: Neurological Surgery

## 2022-12-24 DIAGNOSIS — M4804 Spinal stenosis, thoracic region: Secondary | ICD-10-CM | POA: Diagnosis not present

## 2023-01-14 ENCOUNTER — Other Ambulatory Visit: Payer: Self-pay | Admitting: Neurological Surgery

## 2023-01-18 NOTE — Pre-Procedure Instructions (Signed)
Surgical Instructions    Your procedure is scheduled on Thursday, May 9th.  Report to [facility] Main Entrance "A" at 05:30 A.M., then check in with the Admitting office.  Call this number if you have problems the morning of surgery:  [phone]  If you have any questions prior to your surgery date call [phone]: Open Monday-Friday 8am-4pm If you experience any cold or flu symptoms such as cough, fever, chills, shortness of breath, etc. between now and your scheduled surgery, please notify us at the above number.     Remember:  Do not eat or drink after midnight the night before your surgery    Take these medicines the morning of surgery with A SIP OF WATER  carbidopa-levodopa (SINEMET IR)  finasteride (PROSCAR)  pregabalin (LYRICA)  tamsulosin (FLOMAX)  tiZANidine (ZANAFLEX)    If needed: HYDROmorphone (DILAUDID)  methocarbamol (ROBAXIN)  oxyCODONE (OXY IR/ROXICODONE)  Polyethyl Glycol-Propyl Glycol (LUBRICANT EYE DROPS)   Follow your surgeon's instructions on when to stop Aspirin.  If no instructions were given by your surgeon then you will need to call the office to get those instructions.     As of today, STOP taking any Aleve, Naproxen, Ibuprofen, Motrin, Advil, Goody's, BC's, all herbal medications, fish oil, and all vitamins.                     Do NOT Smoke (Tobacco/Vaping) for 24 hours prior to your procedure.  If you use a CPAP at night, you may bring your mask/headgear for your overnight stay.   Contacts, glasses, piercing's, hearing aid's, dentures or partials may not be worn into surgery, please bring cases for these belongings.    For patients admitted to the hospital, discharge time will be determined by your treatment team.   Patients discharged the day of surgery will not be allowed to drive home, and someone needs to stay with them for 24 hours.  SURGICAL WAITING ROOM VISITATION Patients having surgery or a procedure may have no more than 2  support people in the waiting area - these visitors may rotate.   Children under the age of 29 must have an adult with them who is not the patient. If the patient needs to stay at the hospital during part of their recovery, the visitor guidelines for inpatient rooms apply. Pre-op nurse will coordinate an appropriate time for 1 support person to accompany patient in pre-op.  This support person may not rotate.   Please refer to the [facility] website for the visitor guidelines for Inpatients (after your surgery is over and you are in a regular room).     Day of Surgery: Take a shower with CHG soap. Do not wear jewelry  Do not wear lotions, powders, colognes, or deodorant. Men may shave face and neck. Do not bring valuables to the hospital. [facility] is not responsible for any belongings or valuables. Wear Clean/Comfortable clothing the morning of surgery Remember to brush your teeth WITH YOUR REGULAR TOOTHPASTE.   Please read over the following fact sheets that you were given.    If you received a COVID test during your pre-op visit  it is requested that you wear a mask when out in public, stay away from anyone that may not be feeling well and notify your surgeon if you develop symptoms. If you have been in contact with anyone that has tested positive in the last 10 days please notify you surgeon.

## 2023-01-21 ENCOUNTER — Encounter (HOSPITAL_COMMUNITY): Payer: Self-pay

## 2023-01-21 ENCOUNTER — Other Ambulatory Visit: Payer: Self-pay

## 2023-01-21 ENCOUNTER — Encounter (HOSPITAL_COMMUNITY)
Admission: RE | Admit: 2023-01-21 | Discharge: 2023-01-21 | Disposition: A | Discharge status: Home / Self Care | Payer: Medicare Other | Source: Ambulatory Visit | Attending: Neurological Surgery | Admitting: Neurological Surgery

## 2023-01-21 VITALS — BP 99/48 | HR 71 | Temp 97.9°F | Resp 18

## 2023-01-21 DIAGNOSIS — Z87891 Personal history of nicotine dependence: Secondary | ICD-10-CM | POA: Insufficient documentation

## 2023-01-21 DIAGNOSIS — Z01818 Encounter for other preprocedural examination: Secondary | ICD-10-CM

## 2023-01-21 DIAGNOSIS — N183 Chronic kidney disease, stage 3 unspecified: Secondary | ICD-10-CM | POA: Insufficient documentation

## 2023-01-21 DIAGNOSIS — Z01812 Encounter for preprocedural laboratory examination: Secondary | ICD-10-CM | POA: Diagnosis not present

## 2023-01-21 DIAGNOSIS — I129 Hypertensive chronic kidney disease with stage 1 through stage 4 chronic kidney disease, or unspecified chronic kidney disease: Secondary | ICD-10-CM | POA: Insufficient documentation

## 2023-01-21 DIAGNOSIS — I252 Old myocardial infarction: Secondary | ICD-10-CM | POA: Diagnosis not present

## 2023-01-21 DIAGNOSIS — I214 Non-ST elevation (NSTEMI) myocardial infarction: Secondary | ICD-10-CM

## 2023-01-21 DIAGNOSIS — I2510 Atherosclerotic heart disease of native coronary artery without angina pectoris: Secondary | ICD-10-CM | POA: Diagnosis not present

## 2023-01-21 DIAGNOSIS — G20A1 Parkinson's disease without dyskinesia, without mention of fluctuations: Secondary | ICD-10-CM | POA: Insufficient documentation

## 2023-01-21 DIAGNOSIS — Z981 Arthrodesis status: Secondary | ICD-10-CM | POA: Insufficient documentation

## 2023-01-21 LAB — CBC
HCT: 30.4 % — ABNORMAL LOW (ref 39.0–52.0)
Hemoglobin: 9.7 g/dL — ABNORMAL LOW (ref 13.0–17.0)
MCH: 28.4 pg (ref 26.0–34.0)
MCHC: 31.9 g/dL (ref 30.0–36.0)
MCV: 88.9 fL (ref 80.0–100.0)
Platelets: 188 10*3/uL (ref 150–400)
RBC: 3.42 MIL/uL — ABNORMAL LOW (ref 4.22–5.81)
RDW: 15.4 % (ref 11.5–15.5)
WBC: 7 10*3/uL (ref 4.0–10.5)
nRBC: 0 % (ref 0.0–0.2)

## 2023-01-21 LAB — BASIC METABOLIC PANEL
Anion gap: 7 (ref 5–15)
BUN: 23 mg/dL (ref 8–23)
CO2: 26 mmol/L (ref 22–32)
Calcium: 9.1 mg/dL (ref 8.9–10.3)
Chloride: 102 mmol/L (ref 98–111)
Creatinine, Ser: 0.93 mg/dL (ref 0.61–1.24)
GFR, Estimated: >60 mL/min (ref >60)
Glucose, Bld: 89 mg/dL (ref 70–99)
Potassium: 3.9 mmol/L (ref 3.5–5.1)
Sodium: 135 mmol/L (ref 135–145)

## 2023-01-21 LAB — SURGICAL PCR SCREEN
MRSA, PCR: NEGATIVE
Staphylococcus aureus: NEGATIVE

## 2023-01-21 LAB — TYPE AND SCREEN
ABO/RH(D): A POS
Antibody Screen: NEGATIVE

## 2023-01-21 NOTE — Progress Notes (Signed)
PCP - Dr. Wilhelmina Mcardle Cardiologist - Dr. Gypsy Balsam  PPM/ICD - denies   Chest x-ray - 08/09/22 EKG - 10/31/22 Stress Test - denies ECHO - 10/31/22 Cardiac Cath - 03/23/19  Sleep Study - 15 yrs ago per pt, OSA+ CPAP - denies  DM- denies  Blood Thinner Instructions: n/a Aspirin Instructions: Hold 5 days. Pt has already stopped taking ASA  ERAS Protcol - no. NPO   COVID TEST- n/a   Anesthesia review: yes, cardiac hx. Pt just had surgery 11/01/22  Patient denies shortness of breath, fever, cough and chest pain at PAT appointment   All instructions explained to the patient, with a verbal understanding of the material. Patient agrees to go over the instructions while at home for a better understanding. The opportunity to ask questions was provided.

## 2023-01-22 ENCOUNTER — Other Ambulatory Visit: Payer: Self-pay | Admitting: Neurological Surgery

## 2023-01-22 NOTE — Progress Notes (Signed)
Anesthesia Chart Review:  Case: [id number] Date/Time: 01/31/23 0715   Procedure: OPEN THORACIC EXTENSION FUSION T10, T11, REM HARDWARE T12 W/CEMENT AUGMENTATION - 3C   Anesthesia type: General   Pre-op diagnosis: FUSION OF THE SPINE, THORACOLUMBAR REGION   [facility]: [facility]OM [facility] / [facility]   Surgeons: Dawley, Alan Mulder, DO       DISCUSSION:  Patient is an 89 year old male scheduled for the above procedure. He is s/p T12-L1 laminectomy, discectomy, posterolateral fusion and exploration of L1-3 fusion 11/01/22 for thoracic myelopathy. Post-operative course was uneventful with discharge to SNF for rehab therapies recommended. (He had previously lived in independent living facility.)   History includes former smoker (quit 09/24/78), HTN, HLD, CAD (NSTEMI, EF 45-50%->no CAD by 03/23/19 LHC, possibly related to mild myocarditis; LVEF 60-65% 08/21/21), murmur/MV disorder (mild-moderate MR, mild MS 08/21/21 echo), LBBB, Parkinson's  disease, OSA (severe 09/2010 does not use CPAP), impaired fasting glucose, CKD (stage 3), BPH, anemia, thrombocytopenia, peripheral edema, empyema (s/p left VATS/decortication 09/09/17), spinal surgery (L3-4 anterolateral decompression/plating 10/06/09, L3-4 posterolateral arthrodesis 07/20/10; C3-7 posterior fusion 06/18/14; L1-3 ALIF 09/22/19; T12-L1 laminectomy, discectomy, posterolateral fusion and exploration of L1-3 fusion 11/01/22).   Last neurology visit with Dr. Arbutus Leas was on 09/03/22. Carbidopa/levodopa increased. Six month follow-up planned.   Last cardiology evaluation was on 10/31/22 by Dr. Bing Matter for preoperative evaluation prior to 11/01/22 thoracic lumber surgery. Echo ordered and showed preserved LVEF and a "myxomatous mitral valve with possibly flail posterior leaflet of the mitral valve however regurgitation is unchanged as compared to prior echocardiogram with mild to moderate which is directed anteriorly, left atrium is significantly enlarged." No evidence of MS noted. He felt  Mr. Perry was a "reasonable candidate for surgery" at that time. He recommended to avoid excessive fluids and to pay special attention to fluid status and hemodynamic monitoring.    H/H 9.7/30.4 but appears stable over the past several months. T&S already done. ASA in on hold.   Anesthesia team to evaluate on the day of surgery.   VS: BP (!) 99/48   Pulse 71   Temp 36.6 C   Resp 18   SpO2 100%  BP Readings from Last 3 Encounters:  01/21/23 (!) 99/48  11/30/22 (!) 155/74  11/07/22 (!) 108/58     PROVIDERS: Nadara Eaton, MD is PCP  Gypsy Balsam, MD is cardiologist Tat, Lurena Joiner, DO is neurologist Terrial Rhodes, MD is nephrologist    LABS: Preoperative labs noted. H/H 9.7, but stable when compared to lab trends.  (all labs ordered are listed, but only abnormal results are displayed)  Labs Reviewed  CBC - Abnormal; Notable for the following components:      Result Value   RBC 3.42 (*)    Hemoglobin 9.7 (*)    HCT 30.4 (*)    All other components within normal limits  SURGICAL PCR SCREEN  BASIC METABOLIC PANEL  TYPE AND SCREEN   Comparison labs show H/H 10.3/31.5 on 11/30/22, 9.4/29.2 11/04/22, 8.1/26.0 11/02/22, 10.0/30.2 on 10/11/22.    IMAGES: CT T-spine 12/24/22: IMPRESSION: 1. Recent T12-L1 posterior fixation. Loosening of the bilateral T12 pedicle screws. 2. Healing fractures of the T12 left pedicle, T12 right transverse process, and L1 right transverse process. 3. Spondylitic ankylosis spanning C6-T12 at least.   MRI L-spine 10/06/12: IMPRESSION: 1. At T12-L1 there is a large right paracentral disc extrusion with cephalad migration of disc material fluid hand possible small epidural hematoma component with the overall area measuring 15 x  9 x 27 mm and resulting in mass effect on the right intraspinal T12 nerve root. Mild bilateral facet arthropathy. Moderate spinal stenosis. Moderate right foraminal stenosis. No left foraminal stenosis. 2.  Posterior lumbar fusion from L1 through S1 as described above. No foraminal or central canal stenosis.     EKG:  EKG 10/31/22 [facility]):  Normal sinus rhythm Left axis deviation Left bundle branch block Abnormal ECG  EKG 10/24/22: Normal sinus rhythm Possible Left atrial enlargement Left axis deviation Left bundle branch block Abnormal ECG When compared with ECG of 21-Jul-2020 21:04, PREVIOUS ECG IS PRESENT No significant change since last tracing Confirmed by Olga Millers ([id number]) on 10/24/2022 1:48:48 PM     CV: Echo 10/31/22: IMPRESSIONS   1. Left ventricular ejection fraction, by estimation, is 55 to 60%. The  left ventricle has normal function. The left ventricle has no regional  wall motion abnormalities. Left ventricular diastolic parameters are  indeterminate.   2. Right ventricular systolic function is normal. The right ventricular  size is normal.   3. Left atrial size was moderately dilated.   4. Possibly flailed posterior leaflet of MV. Unchange as compared to old  echo. The mitral valve is myxomatous. Moderate mitral valve regurgitation.  No evidence of mitral stenosis.   5. The aortic valve is normal in structure. Aortic valve regurgitation is  not visualized. Aortic valve sclerosis/calcification is present, without  any evidence of aortic stenosis.   6. The inferior vena cava is normal in size with greater than 50%  respiratory variability, suggesting right atrial pressure of 3 mmHg.  - Comparison 08/11/21: LVEF 60-65%, cannot rule out mild MV flail leaflet, mild-moderate MR, Mild MS, mild late systolic prolapse of the middle scallop of  the posterior leaflet of the mitral valve.      Cardiac cath 03/23/19: 1. No angiographic evidence of CAD Recommendations: No further ischemic workup.     Past Medical History:  Diagnosis Date   Acute kidney injury superimposed on chronic kidney disease (HCC) 09/05/2017   Anemia 09/21/2017   Anemia due to chronic  blood loss 01/10/2021   Arthritis    Degeneration spine & stenosis   Benign prostatic hyperplasia 01/10/2021   Benign prostatic hyperplasia with lower urinary tract symptoms 01/10/2021   Body mass index (BMI) 25.0-25.9, adult 08/31/2019   CAD (coronary artery disease) 04/20/2019   Cervical myelopathy with cervical radiculopathy (HCC) 06/24/2014   Cervical spondylosis without myelopathy 05/12/2014   Chronic bilateral low back pain 07/26/2020   Chronic kidney disease, stage 3 unspecified (HCC) 01/10/2021   Chronic low back pain 07/29/2013   Chronic pain 09/05/2017   Degenerative cervical spinal stenosis 05/26/2014   Degenerative lumbar spinal stenosis 09/22/2019   Displacement of cervical intervertebral disc 07/19/2014   Edema 01/10/2021   Elevated troponin 09/05/2017   Empyema lung (HCC) 09/09/2017   Enlarged prostate 01/10/2021   Essential hypertension 08/29/2010   Qualifier: Diagnosis of  By: Vassie Loll MD, Comer Locket     Fall in home 07/10/2017   Hardening of the aorta (main artery of the heart) (HCC) 01/10/2021   History of kidney stones    Hyperlipidemia 06/21/2014   Hypertension    has a histroy of   Hypoalbuminemia 01/10/2021   Hyponatremia 09/05/2017   Hypotension    Impaired fasting glucose 01/10/2021   Intermittent tremor 01/10/2021   Iron deficiency anemia 01/10/2021   Kidney stone 01/10/2021   LBBB (left bundle branch block) 06/21/2014   Lumbar post-laminectomy syndrome 07/15/2013  Lumbar radiculopathy 07/29/2013   Lumbar stenosis with neurogenic claudication 08/31/2019   Malnutrition of moderate degree 03/23/2019   Mitral valve disorder 01/10/2021   Murmur 01/08/2017   Muscle pain 03/01/2020   Myocardial infarction (HCC) 03/21/2019   Neck pain 03/03/2014   Normochromic normocytic anemia 09/05/2017   NSTEMI (non-ST elevated myocardial infarction) (HCC) 03/21/2019   Obesity 04/06/2019   Obstructive sleep apnea 09/30/2010   PSG (180 lbs ) >> severe obstructive sleep apnea with AHi 38/h & desatns to  82%  Started on auto 5-15  >> changed to 8 cm Download reviewed 1/21 - 11/23/10 >> residual events of 6/h , avg pr 8 cm, leak + with nasal pillows     Occasional tremors 07/26/2020   Old myocardial infarction 01/10/2021   Osteoarthrosis 01/10/2021   Osteopenia 01/10/2021   Other specified disorders of kidney and ureter 01/10/2021   Parkinson's disease    Peripheral edema 01/10/2021   Peripheral neuralgia 01/10/2021   Personal history of colonic polyps 01/10/2021   Personal history of other diseases of the respiratory system 01/10/2021   Pleural effusion on left 08/19/2017   Acute symptoms since 08/13/17 > CTa  08/13/17  neg pe/ extensively loculated effusion - L thoracentesis 08/20/2017 :  220 cc with glucose < 20 and WBC 5,338 mostly Poly, LDH 688 > cyt neg >Referrd to T surgery > VATS 09/09/17 c/w empyema   Pleural effusion, left 09/04/2017   Preoperative cardiovascular examination 06/21/2014   Protein-calorie malnutrition, severe 09/11/2017   Pure hypercholesterolemia 01/10/2021   Sciatica 01/10/2021   Scoliosis concern 08/31/2019   Sensorineural hearing loss (SNHL) of both ears 06/15/2020   Last Assessment & Plan:  Formatting of this note might be different from the original. Concern over worsening hearing. Chronic history of sensorineural hearing loss bilaterally.  Has hearing aids currently.  He feels his hearing aids are not working as well as they historically have.  Recently had them checked. EXAM shows normal external canals and tympanic membranes bilaterally. AUDIOGRAM Shows B   Shoulder joint pain 12/01/2013   Sinusitis, chronic 08/19/2017   CT sinus  08/20/2017 >>> Normally aerated paranasal sinuses.  Patent sinus drainage pathways.   SIRS (systemic inflammatory response syndrome) (HCC) 09/05/2017   Skin ulcer of toe of left foot, limited to breakdown of skin (HCC) 09/04/2016   Sleep apnea 2012   used CPAP 2 yrs. ago, feels he sleeps better w/o, no longer using    Spinal stenosis of lumbar  region 08/10/2013   Spinal stenosis, lumbar region without neurogenic claudication 08/19/2019   Symptomatic anemia 09/20/2017   Thrombocytopenia (HCC) 01/10/2021   Tubular adenoma of colon 01/10/2021   Unspecified abnormal finding in specimens from other organs, systems and tissues 01/10/2021    Past Surgical History:  Procedure Laterality Date   ANTERIOR LAT LUMBAR FUSION N/A 09/22/2019   Procedure: Lumbar one-two, Lumbar two-three Anterolateral lumbar interbody fusion with pedicle screw fixation and exploration of adjacent fusion;  Surgeon: Maeola Harman, MD;  Location: [facility] OR;  Service: Neurosurgery;  Laterality: N/A;   ARM NEUROPLASTY     at 14 yrs. of age- fell off tractor- had fracture & repair *& later- 1990's had  transplantation of a nerve at the elbow   BACK SURGERY     x4 back surgery x2 fusion -   BACK SURGERY  11/01/2022   T12-L1 posterior lateral instrumentation and fusion   CARDIAC CATHETERIZATION  03/23/2019   CARPAL TUNNEL RELEASE Right    COLONOSCOPY  ESOPHAGOGASTRODUODENOSCOPY (EGD) WITH PROPOFOL N/A 09/23/2017   Procedure: ESOPHAGOGASTRODUODENOSCOPY (EGD) WITH PROPOFOL;  Surgeon: Carman Ching, MD;  Location: [facility] ENDOSCOPY;  Service: Endoscopy;  Laterality: N/A;   LEFT HEART CATH AND CORONARY ANGIOGRAPHY N/A 03/23/2019   Procedure: LEFT HEART CATH AND CORONARY ANGIOGRAPHY;  Surgeon: Kathleene Hazel, MD;  Location: [facility] INVASIVE CV LAB;  Service: Cardiovascular;  Laterality: N/A;   LUMBAR PERCUTANEOUS PEDICLE SCREW 2 LEVEL N/A 09/22/2019   Procedure: LUMBAR PERCUTANEOUS PEDICLE SCREW TWO LEVEL;  Surgeon: Maeola Harman, MD;  Location: [facility] OR;  Service: Neurosurgery;  Laterality: N/A;   PLEURAL EFFUSION DRAINAGE Left 09/09/2017   Procedure: DRAINAGE OF PLEURAL EFFUSION;  Surgeon: Loreli Slot, MD;  Location: [facility] OR;  Service: Thoracic;  Laterality: Left;   POSTERIOR CERVICAL FUSION/FORAMINOTOMY N/A 06/24/2014   Procedure: Posterior Cervical Three-Seven  Fusion with Lateral Mass Fixation;  Surgeon: Maeola Harman, MD;  Location: [facility] NEURO ORS;  Service: Neurosurgery;  Laterality: N/A;  C3-C7 posterior cervical fusion with lateral mass fixation   SHOULDER SURGERY Bilateral    VIDEO ASSISTED THORACOSCOPY (VATS)/DECORTICATION Left 09/09/2017   Procedure: VIDEO ASSISTED THORACOSCOPY (VATS)/DECORTICATION;  Surgeon: Loreli Slot, MD;  Location: [facility] OR;  Service: Thoracic;  Laterality: Left;   VIDEO BRONCHOSCOPY  09/09/2017   Procedure: VIDEO BRONCHOSCOPY;  Surgeon: Loreli Slot, MD;  Location: [facility] OR;  Service: Thoracic;;    MEDICATIONS:  aspirin EC 81 MG tablet   Calcium-Magnesium-Zinc (CAL-MAG-ZINC PO)   carbidopa-levodopa (SINEMET IR) 25-100 MG tablet   Carbidopa-Levodopa ER (SINEMET CR) 25-100 MG tablet controlled release   Cholecalciferol (VITAMIN D3) 50 MCG (2000 UT) capsule   docusate sodium (COLACE) 100 MG capsule   finasteride (PROSCAR) 5 MG tablet   fluocinonide cream (LIDEX) 0.05 %   furosemide (LASIX) 20 MG tablet   HYDROmorphone (DILAUDID) 2 MG tablet   L-LYSINE PO   losartan (COZAAR) 25 MG tablet   magnesium hydroxide (MILK OF MAGNESIA) 400 MG/5ML suspension   methocarbamol (ROBAXIN) 500 MG tablet   mineral oil liquid   Multiple Vitamin (MULTIVITAMIN WITH MINERALS) TABS tablet   nortriptyline (PAMELOR) 25 MG capsule   oxyCODONE (OXY IR/ROXICODONE) 5 MG immediate release tablet   Polyethyl Glycol-Propyl Glycol (LUBRICANT EYE DROPS) 0.4-0.3 % SOLN   pregabalin (LYRICA) 150 MG capsule   pyridOXINE (VITAMIN B-6) 100 MG tablet   Sennosides 15 MG TABS   simvastatin (ZOCOR) 40 MG tablet   sodium chloride (OCEAN) 0.65 % SOLN nasal spray   SUPER B COMPLEX/C PO   tamsulosin (FLOMAX) 0.4 MG CAPS capsule   tiZANidine (ZANAFLEX) 2 MG tablet   traZODone (DESYREL) 150 MG tablet   vitamin B-12 (CYANOCOBALAMIN) 1000 MCG tablet   vitamin C (ASCORBIC ACID) 500 MG tablet   No current facility-administered medications for this  encounter.     Shonna Chock, PA-C Surgical Short Stay/Anesthesiology [facility] Phone [phone] [facility] Phone [phone] 01/22/2023 5:39 PM

## 2023-01-22 NOTE — Anesthesia Preprocedure Evaluation (Addendum)
Anesthesia Evaluation  Patient identified by MRN, date of birth, ID band Patient awake    Reviewed: Allergy & Precautions, NPO status , Patient's Chart, lab work & pertinent test results  Airway Mallampati: III  TM Distance: >3 FB Neck ROM: Limited    Dental no notable dental hx.    Pulmonary sleep apnea , former smoker   Pulmonary exam normal        Cardiovascular hypertension, + CAD and + Past MI  + dysrhythmias + Valvular Problems/Murmurs MR  Rhythm:Regular Rate:Normal     Neuro/Psych  Neuromuscular disease  negative psych ROS   GI/Hepatic negative GI ROS, Neg liver ROS,,,  Endo/Other  negative endocrine ROS    Renal/GU CRFRenal disease  negative genitourinary   Musculoskeletal  (+) Arthritis , Osteoarthritis,    Abdominal Normal abdominal exam  (+)   Peds  Hematology  (+) Blood dyscrasia, anemia Lab Results      Component                Value               Date                      WBC                      7.0                 01/21/2023                HGB                      9.7 (L)             01/21/2023                HCT                      30.4 (L)            01/21/2023                MCV                      88.9                01/21/2023                PLT                      188                 01/21/2023             Lab Results      Component                Value               Date                      NA                       135                 01/21/2023                K  3.9                 01/21/2023                CO2                      26                  01/21/2023                GLUCOSE                  89                  01/21/2023                BUN                      23                  01/21/2023                CREATININE               0.93                01/21/2023                CALCIUM                  9.1                 01/21/2023                 GFRNONAA                 >60                 01/21/2023              Anesthesia Other Findings   Reproductive/Obstetrics                             Anesthesia Physical Anesthesia Plan  ASA: 3  Anesthesia Plan: General   Post-op Pain Management: Ofirmev IV (intra-op)*   Induction: Intravenous  PONV Risk Score and Plan: 2 and Ondansetron, Dexamethasone and Treatment may vary due to age or medical condition  Airway Management Planned: Mask, Oral ETT and Video Laryngoscope Planned  Additional Equipment: ClearSight  Intra-op Plan:   Post-operative Plan: Extubation in OR  Informed Consent: I have reviewed the patients History and Physical, chart, labs and discussed the procedure including the risks, benefits and alternatives for the proposed anesthesia with the patient or authorized representative who has indicated his/her understanding and acceptance.     Dental advisory given  Plan Discussed with: CRNA  Anesthesia Plan Comments: (PAT note written 01/22/2023 by Shonna Chock, PA-C.  ECHO 02/24: Echo 10/31/22: IMPRESSIONS   1. Left ventricular ejection fraction, by estimation, is 55 to 60%. The  left ventricle has normal function. The left ventricle has no regional  wall motion abnormalities. Left ventricular diastolic parameters are  indeterminate.   2. Right ventricular systolic function is normal. The right ventricular  size is normal.   3. Left atrial size was moderately dilated.   4. Possibly flailed posterior leaflet of MV. Unchange as compared to old  echo. The mitral valve is myxomatous. Moderate  mitral valve regurgitation.  No evidence of mitral stenosis.   5. The aortic valve is normal in structure. Aortic valve regurgitation is  not visualized. Aortic valve sclerosis/calcification is present, without  any evidence of aortic stenosis.   6. The inferior vena cava is normal in size with greater than 50%  respiratory variability,  suggesting right atrial pressure of 3 mmHg.  - Comparison 08/11/21: LVEF 60-65%, cannot rule out mild MV flail leaflet, mild-moderate MR, Mild MS, mild late systolic prolapse of the middle scallop of  the posterior leaflet of the mitral valve.    )       Anesthesia Quick Evaluation

## 2023-01-25 ENCOUNTER — Other Ambulatory Visit (HOSPITAL_BASED_OUTPATIENT_CLINIC_OR_DEPARTMENT_OTHER): Payer: Self-pay | Admitting: Neurological Surgery

## 2023-01-25 ENCOUNTER — Telehealth (HOSPITAL_BASED_OUTPATIENT_CLINIC_OR_DEPARTMENT_OTHER): Payer: Self-pay

## 2023-01-25 DIAGNOSIS — M4325 Fusion of spine, thoracolumbar region: Secondary | ICD-10-CM

## 2023-01-27 ENCOUNTER — Ambulatory Visit (HOSPITAL_BASED_OUTPATIENT_CLINIC_OR_DEPARTMENT_OTHER)
Admission: RE | Admit: 2023-01-27 | Discharge: 2023-01-27 | Disposition: A | Discharge status: Home / Self Care | Payer: Medicare Other | Source: Ambulatory Visit | Attending: Neurological Surgery | Admitting: Neurological Surgery

## 2023-01-27 DIAGNOSIS — M4325 Fusion of spine, thoracolumbar region: Secondary | ICD-10-CM | POA: Diagnosis present

## 2023-01-31 ENCOUNTER — Inpatient Hospital Stay (HOSPITAL_COMMUNITY): Payer: Medicare Other | Admitting: Anesthesiology

## 2023-01-31 ENCOUNTER — Inpatient Hospital Stay (HOSPITAL_COMMUNITY): Payer: Medicare Other

## 2023-01-31 ENCOUNTER — Inpatient Hospital Stay (HOSPITAL_COMMUNITY)
Admission: RE | Admit: 2023-01-31 | Discharge: 2023-02-05 | DRG: 460 | Disposition: A | Discharge status: Skilled Nursing Facility | Payer: Medicare Other | Attending: Neurological Surgery | Admitting: Neurological Surgery

## 2023-01-31 ENCOUNTER — Encounter (HOSPITAL_COMMUNITY): Payer: Self-pay | Admitting: Neurological Surgery

## 2023-01-31 ENCOUNTER — Other Ambulatory Visit: Payer: Self-pay

## 2023-01-31 ENCOUNTER — Inpatient Hospital Stay (HOSPITAL_COMMUNITY)
Admission: RE | Disposition: A | Discharge status: Skilled Nursing Facility | Payer: Self-pay | Source: Home / Self Care | Attending: Neurological Surgery

## 2023-01-31 ENCOUNTER — Inpatient Hospital Stay (HOSPITAL_COMMUNITY): Payer: Medicare Other | Admitting: Vascular Surgery

## 2023-01-31 DIAGNOSIS — Z87891 Personal history of nicotine dependence: Secondary | ICD-10-CM | POA: Diagnosis not present

## 2023-01-31 DIAGNOSIS — T84296A Other mechanical complication of internal fixation device of vertebrae, initial encounter: Secondary | ICD-10-CM | POA: Diagnosis not present

## 2023-01-31 DIAGNOSIS — I252 Old myocardial infarction: Secondary | ICD-10-CM | POA: Diagnosis not present

## 2023-01-31 DIAGNOSIS — M455 Ankylosing spondylitis of thoracolumbar region: Secondary | ICD-10-CM | POA: Diagnosis not present

## 2023-01-31 DIAGNOSIS — Z806 Family history of leukemia: Secondary | ICD-10-CM | POA: Diagnosis not present

## 2023-01-31 DIAGNOSIS — Z981 Arthrodesis status: Secondary | ICD-10-CM

## 2023-01-31 DIAGNOSIS — Z7982 Long term (current) use of aspirin: Secondary | ICD-10-CM

## 2023-01-31 DIAGNOSIS — H903 Sensorineural hearing loss, bilateral: Secondary | ICD-10-CM | POA: Diagnosis present

## 2023-01-31 DIAGNOSIS — R1031 Right lower quadrant pain: Secondary | ICD-10-CM | POA: Diagnosis not present

## 2023-01-31 DIAGNOSIS — Z87442 Personal history of urinary calculi: Secondary | ICD-10-CM

## 2023-01-31 DIAGNOSIS — M62838 Other muscle spasm: Secondary | ICD-10-CM | POA: Diagnosis not present

## 2023-01-31 DIAGNOSIS — Z888 Allergy status to other drugs, medicaments and biological substances status: Secondary | ICD-10-CM | POA: Diagnosis not present

## 2023-01-31 DIAGNOSIS — S22089K Unspecified fracture of T11-T12 vertebra, subsequent encounter for fracture with nonunion: Secondary | ICD-10-CM | POA: Diagnosis not present

## 2023-01-31 DIAGNOSIS — I2510 Atherosclerotic heart disease of native coronary artery without angina pectoris: Secondary | ICD-10-CM | POA: Diagnosis present

## 2023-01-31 DIAGNOSIS — Y828 Other medical devices associated with adverse incidents: Secondary | ICD-10-CM | POA: Diagnosis present

## 2023-01-31 DIAGNOSIS — G4733 Obstructive sleep apnea (adult) (pediatric): Secondary | ICD-10-CM | POA: Diagnosis present

## 2023-01-31 DIAGNOSIS — Y838 Other surgical procedures as the cause of abnormal reaction of the patient, or of later complication, without mention of misadventure at the time of the procedure: Secondary | ICD-10-CM | POA: Diagnosis present

## 2023-01-31 DIAGNOSIS — S22089A Unspecified fracture of T11-T12 vertebra, initial encounter for closed fracture: Secondary | ICD-10-CM | POA: Diagnosis present

## 2023-01-31 DIAGNOSIS — M960 Pseudarthrosis after fusion or arthrodesis: Secondary | ICD-10-CM | POA: Diagnosis present

## 2023-01-31 DIAGNOSIS — M419 Scoliosis, unspecified: Secondary | ICD-10-CM | POA: Diagnosis present

## 2023-01-31 DIAGNOSIS — M454 Ankylosing spondylitis of thoracic region: Secondary | ICD-10-CM | POA: Diagnosis present

## 2023-01-31 DIAGNOSIS — Z79899 Other long term (current) drug therapy: Secondary | ICD-10-CM

## 2023-01-31 DIAGNOSIS — M810 Age-related osteoporosis without current pathological fracture: Secondary | ICD-10-CM | POA: Diagnosis present

## 2023-01-31 DIAGNOSIS — G20A1 Parkinson's disease without dyskinesia, without mention of fluctuations: Secondary | ICD-10-CM | POA: Diagnosis present

## 2023-01-31 DIAGNOSIS — Z86010 Personal history of colonic polyps: Secondary | ICD-10-CM | POA: Diagnosis not present

## 2023-01-31 DIAGNOSIS — N183 Chronic kidney disease, stage 3 unspecified: Secondary | ICD-10-CM | POA: Diagnosis present

## 2023-01-31 DIAGNOSIS — T84038A Mechanical loosening of other internal prosthetic joint, initial encounter: Secondary | ICD-10-CM | POA: Diagnosis present

## 2023-01-31 DIAGNOSIS — I129 Hypertensive chronic kidney disease with stage 1 through stage 4 chronic kidney disease, or unspecified chronic kidney disease: Secondary | ICD-10-CM | POA: Diagnosis present

## 2023-01-31 DIAGNOSIS — E7800 Pure hypercholesterolemia, unspecified: Secondary | ICD-10-CM | POA: Diagnosis present

## 2023-01-31 DIAGNOSIS — M25551 Pain in right hip: Secondary | ICD-10-CM | POA: Diagnosis not present

## 2023-01-31 DIAGNOSIS — N401 Enlarged prostate with lower urinary tract symptoms: Secondary | ICD-10-CM | POA: Diagnosis present

## 2023-01-31 DIAGNOSIS — Z800 Family history of malignant neoplasm of digestive organs: Secondary | ICD-10-CM

## 2023-01-31 HISTORY — PX: LAMINECTOMY WITH POSTERIOR LATERAL ARTHRODESIS LEVEL 2: SHX6336

## 2023-01-31 SURGERY — LAMINECTOMY WITH POSTERIOR LATERAL ARTHRODESIS LEVEL 2
Anesthesia: General | Site: Spine Thoracic

## 2023-01-31 MED ORDER — THROMBIN 20000 UNITS EX SOLR: CUTANEOUS | Status: DC | PRN

## 2023-01-31 MED ORDER — CHLORHEXIDINE GLUCONATE 0.12 % MT SOLN
15.0000 mL | Freq: Once | OROMUCOSAL | Status: AC
  Administered 2023-01-31: 15 mL via OROMUCOSAL
  Filled 2023-01-31: qty 15

## 2023-01-31 MED ORDER — MENTHOL 3 MG MT LOZG: 1.0000 | LOZENGE | OROMUCOSAL | Status: DC | PRN

## 2023-01-31 MED ORDER — BACITRACIN 500 UNIT/GM EX OINT: TOPICAL_OINTMENT | CUTANEOUS | Status: DC | PRN

## 2023-01-31 MED ORDER — OXYCODONE HCL 5 MG PO TABS
10.0000 mg | ORAL_TABLET | ORAL | Status: DC | PRN
  Administered 2023-01-31 – 2023-02-05 (×11): 10 mg via ORAL
  Filled 2023-01-31 (×11): qty 2

## 2023-01-31 MED ORDER — METHOCARBAMOL 500 MG PO TABS
500.0000 mg | ORAL_TABLET | Freq: Four times a day (QID) | ORAL | Status: DC | PRN
  Administered 2023-01-31 – 2023-02-02 (×3): 500 mg via ORAL
  Filled 2023-01-31 (×3): qty 1

## 2023-01-31 MED ORDER — TRAZODONE HCL 50 MG PO TABS
150.0000 mg | ORAL_TABLET | Freq: Every day | ORAL | Status: DC
  Administered 2023-01-31 – 2023-02-04 (×5): 150 mg via ORAL
  Filled 2023-01-31 (×5): qty 1

## 2023-01-31 MED ORDER — TAMSULOSIN HCL 0.4 MG PO CAPS
0.8000 mg | ORAL_CAPSULE | Freq: Every day | ORAL | Status: DC
  Administered 2023-01-31 – 2023-02-05 (×6): 0.8 mg via ORAL
  Filled 2023-01-31 (×6): qty 2

## 2023-01-31 MED ORDER — CARBIDOPA-LEVODOPA 25-100 MG PO TABS
2.0000 | ORAL_TABLET | Freq: Four times a day (QID) | ORAL | Status: DC
  Administered 2023-01-31 – 2023-02-05 (×20): 2 via ORAL
  Filled 2023-01-31 (×21): qty 2

## 2023-01-31 MED ORDER — SODIUM CHLORIDE 0.9% FLUSH: 3.0000 mL | INTRAVENOUS | Status: DC | PRN

## 2023-01-31 MED ORDER — LIDOCAINE 2% (20 MG/ML) 5 ML SYRINGE
INTRAMUSCULAR | Status: AC
  Filled 2023-01-31: qty 5

## 2023-01-31 MED ORDER — ONDANSETRON HCL 4 MG PO TABS: 4.0000 mg | ORAL_TABLET | Freq: Four times a day (QID) | ORAL | Status: DC | PRN

## 2023-01-31 MED ORDER — BACITRACIN ZINC 500 UNIT/GM EX OINT
TOPICAL_OINTMENT | CUTANEOUS | Status: AC
  Filled 2023-01-31: qty 28.35

## 2023-01-31 MED ORDER — SUGAMMADEX SODIUM 200 MG/2ML IV SOLN
INTRAVENOUS | Status: DC | PRN
  Administered 2023-01-31: 200 mg via INTRAVENOUS

## 2023-01-31 MED ORDER — PHENYLEPHRINE 80 MCG/ML (10ML) SYRINGE FOR IV PUSH (FOR BLOOD PRESSURE SUPPORT)
PREFILLED_SYRINGE | INTRAVENOUS | Status: DC | PRN
  Administered 2023-01-31 (×2): 80 ug via INTRAVENOUS
  Administered 2023-01-31: 160 ug via INTRAVENOUS
  Administered 2023-01-31 (×3): 80 ug via INTRAVENOUS
  Administered 2023-01-31: 160 ug via INTRAVENOUS
  Administered 2023-01-31: 80 ug via INTRAVENOUS

## 2023-01-31 MED ORDER — FINASTERIDE 5 MG PO TABS
5.0000 mg | ORAL_TABLET | Freq: Every day | ORAL | Status: DC
  Administered 2023-01-31 – 2023-02-05 (×6): 5 mg via ORAL
  Filled 2023-01-31 (×6): qty 1

## 2023-01-31 MED ORDER — CEFAZOLIN SODIUM-DEXTROSE 2-4 GM/100ML-% IV SOLN
2.0000 g | INTRAVENOUS | Status: AC
  Administered 2023-01-31: 2 g via INTRAVENOUS
  Filled 2023-01-31: qty 100

## 2023-01-31 MED ORDER — ONDANSETRON HCL 4 MG/2ML IJ SOLN: 4.0000 mg | Freq: Four times a day (QID) | INTRAMUSCULAR | Status: DC | PRN

## 2023-01-31 MED ORDER — FENTANYL CITRATE (PF) 100 MCG/2ML IJ SOLN: 25.0000 ug | INTRAMUSCULAR | Status: DC | PRN

## 2023-01-31 MED ORDER — PROPOFOL 10 MG/ML IV BOLUS
INTRAVENOUS | Status: DC | PRN
  Administered 2023-01-31: 10 mg via INTRAVENOUS
  Administered 2023-01-31: 100 mg via INTRAVENOUS
  Administered 2023-01-31 (×5): 10 mg via INTRAVENOUS

## 2023-01-31 MED ORDER — ONDANSETRON HCL 4 MG/2ML IJ SOLN
INTRAMUSCULAR | Status: DC | PRN
  Administered 2023-01-31: 4 mg via INTRAVENOUS

## 2023-01-31 MED ORDER — PREGABALIN 75 MG PO CAPS
150.0000 mg | ORAL_CAPSULE | Freq: Every day | ORAL | Status: DC
  Administered 2023-02-01 – 2023-02-05 (×5): 150 mg via ORAL
  Filled 2023-01-31: qty 6
  Filled 2023-01-31 (×5): qty 2

## 2023-01-31 MED ORDER — SIMVASTATIN 20 MG PO TABS
40.0000 mg | ORAL_TABLET | Freq: Every day | ORAL | Status: DC
  Administered 2023-01-31 – 2023-02-04 (×5): 40 mg via ORAL
  Filled 2023-01-31 (×5): qty 2

## 2023-01-31 MED ORDER — BUPIVACAINE LIPOSOME 1.3 % IJ SUSP
INTRAMUSCULAR | Status: AC
  Filled 2023-01-31: qty 20

## 2023-01-31 MED ORDER — HYDROMORPHONE HCL 2 MG PO TABS
2.0000 mg | ORAL_TABLET | ORAL | Status: DC | PRN
  Administered 2023-02-02: 2 mg via ORAL
  Filled 2023-01-31: qty 1

## 2023-01-31 MED ORDER — VITAMIN B-12 1000 MCG PO TABS
1000.0000 ug | ORAL_TABLET | Freq: Every day | ORAL | Status: DC
  Administered 2023-01-31 – 2023-02-05 (×6): 1000 ug via ORAL
  Filled 2023-01-31 (×6): qty 1

## 2023-01-31 MED ORDER — CARBIDOPA-LEVODOPA ER 25-100 MG PO TBCR
1.0000 | EXTENDED_RELEASE_TABLET | Freq: Every day | ORAL | Status: DC
  Administered 2023-01-31 – 2023-02-04 (×5): 1 via ORAL
  Filled 2023-01-31 (×5): qty 1

## 2023-01-31 MED ORDER — OXYCODONE HCL 5 MG PO TABS
5.0000 mg | ORAL_TABLET | ORAL | Status: DC | PRN
  Administered 2023-02-01 – 2023-02-03 (×2): 5 mg via ORAL
  Filled 2023-01-31 (×3): qty 1

## 2023-01-31 MED ORDER — MINERAL OIL PO OIL
90.0000 mL | TOPICAL_OIL | Freq: Every day | ORAL | Status: DC
  Administered 2023-01-31: 90 mL via ORAL
  Administered 2023-02-01: 30 mL via ORAL
  Administered 2023-02-02: 60 mL via ORAL
  Administered 2023-02-04: 90 mL via ORAL
  Filled 2023-01-31 (×6): qty 90

## 2023-01-31 MED ORDER — FENTANYL CITRATE (PF) 250 MCG/5ML IJ SOLN
INTRAMUSCULAR | Status: AC
  Filled 2023-01-31: qty 5

## 2023-01-31 MED ORDER — LIDOCAINE-EPINEPHRINE 1 %-1:100000 IJ SOLN
INTRAMUSCULAR | Status: DC | PRN
  Administered 2023-01-31: 5 mL

## 2023-01-31 MED ORDER — SODIUM CHLORIDE 0.9% FLUSH
3.0000 mL | Freq: Two times a day (BID) | INTRAVENOUS | Status: DC
  Administered 2023-01-31 – 2023-02-04 (×9): 3 mL via INTRAVENOUS

## 2023-01-31 MED ORDER — BUPIVACAINE-EPINEPHRINE (PF) 0.5% -1:200000 IJ SOLN
INTRAMUSCULAR | Status: AC
  Filled 2023-01-31: qty 30

## 2023-01-31 MED ORDER — HYDROMORPHONE HCL 1 MG/ML IJ SOLN
0.5000 mg | INTRAMUSCULAR | Status: DC | PRN
  Administered 2023-01-31 – 2023-02-01 (×2): 0.5 mg via INTRAVENOUS
  Filled 2023-01-31 (×3): qty 0.5

## 2023-01-31 MED ORDER — ACETAMINOPHEN 325 MG PO TABS
650.0000 mg | ORAL_TABLET | ORAL | Status: DC | PRN
  Administered 2023-01-31 – 2023-02-04 (×3): 650 mg via ORAL
  Filled 2023-01-31 (×3): qty 2

## 2023-01-31 MED ORDER — ACETAMINOPHEN 10 MG/ML IV SOLN
INTRAVENOUS | Status: AC
  Filled 2023-01-31: qty 100

## 2023-01-31 MED ORDER — PROPOFOL 10 MG/ML IV BOLUS
INTRAVENOUS | Status: AC
  Filled 2023-01-31: qty 20

## 2023-01-31 MED ORDER — FUROSEMIDE 20 MG PO TABS
20.0000 mg | ORAL_TABLET | ORAL | Status: DC
  Administered 2023-02-01 – 2023-02-04 (×2): 20 mg via ORAL
  Filled 2023-01-31 (×4): qty 1

## 2023-01-31 MED ORDER — ACETAMINOPHEN 650 MG RE SUPP: 650.0000 mg | RECTAL | Status: DC | PRN

## 2023-01-31 MED ORDER — PHENOL 1.4 % MT LIQD: 1.0000 | OROMUCOSAL | Status: DC | PRN

## 2023-01-31 MED ORDER — LIDOCAINE-EPINEPHRINE 1 %-1:100000 IJ SOLN
INTRAMUSCULAR | Status: AC
  Filled 2023-01-31: qty 1

## 2023-01-31 MED ORDER — MAGNESIUM HYDROXIDE 400 MG/5ML PO SUSP
5.0000 mL | Freq: Every day | ORAL | Status: DC | PRN
  Administered 2023-02-01 – 2023-02-03 (×2): 5 mL via ORAL
  Filled 2023-01-31 (×3): qty 30

## 2023-01-31 MED ORDER — SENNA 8.6 MG PO TABS
3.0000 | ORAL_TABLET | Freq: Every morning | ORAL | Status: DC
  Administered 2023-02-01 – 2023-02-05 (×4): 25.8 mg via ORAL
  Filled 2023-01-31 (×5): qty 3

## 2023-01-31 MED ORDER — NORTRIPTYLINE HCL 25 MG PO CAPS
25.0000 mg | ORAL_CAPSULE | Freq: Every day | ORAL | Status: DC
  Administered 2023-01-31 – 2023-02-04 (×5): 25 mg via ORAL
  Filled 2023-01-31 (×6): qty 1

## 2023-01-31 MED ORDER — LACTATED RINGERS IV SOLN: INTRAVENOUS | Status: DC

## 2023-01-31 MED ORDER — ROCURONIUM BROMIDE 10 MG/ML (PF) SYRINGE
PREFILLED_SYRINGE | INTRAVENOUS | Status: AC
  Filled 2023-01-31: qty 10

## 2023-01-31 MED ORDER — CARBIDOPA-LEVODOPA 25-100 MG PO TABS
1.0000 | ORAL_TABLET | ORAL | Status: DC
  Filled 2023-01-31: qty 1

## 2023-01-31 MED ORDER — CHLORHEXIDINE GLUCONATE CLOTH 2 % EX PADS: 6.0000 | MEDICATED_PAD | Freq: Once | CUTANEOUS | Status: DC

## 2023-01-31 MED ORDER — BUPIVACAINE LIPOSOME 1.3 % IJ SUSP
INTRAMUSCULAR | Status: DC | PRN
  Administered 2023-01-31: 20 mL

## 2023-01-31 MED ORDER — VANCOMYCIN HCL 1000 MG IV SOLR
INTRAVENOUS | Status: DC | PRN
  Administered 2023-01-31: 1000 mg

## 2023-01-31 MED ORDER — ROCURONIUM BROMIDE 10 MG/ML (PF) SYRINGE
PREFILLED_SYRINGE | INTRAVENOUS | Status: DC | PRN
  Administered 2023-01-31: 20 mg via INTRAVENOUS
  Administered 2023-01-31: 50 mg via INTRAVENOUS
  Administered 2023-01-31: 30 mg via INTRAVENOUS

## 2023-01-31 MED ORDER — ACETAMINOPHEN 10 MG/ML IV SOLN
INTRAVENOUS | Status: DC | PRN
  Administered 2023-01-31: 1000 mg via INTRAVENOUS

## 2023-01-31 MED ORDER — GLYCOPYRROLATE PF 0.2 MG/ML IJ SOSY
PREFILLED_SYRINGE | INTRAMUSCULAR | Status: DC | PRN
  Administered 2023-01-31: .2 mg via INTRAVENOUS

## 2023-01-31 MED ORDER — THROMBIN 5000 UNITS EX SOLR: OROMUCOSAL | Status: DC | PRN

## 2023-01-31 MED ORDER — BUPIVACAINE-EPINEPHRINE 0.5% -1:200000 IJ SOLN
INTRAMUSCULAR | Status: DC | PRN
  Administered 2023-01-31: 5 mL

## 2023-01-31 MED ORDER — THROMBIN 5000 UNITS EX SOLR
CUTANEOUS | Status: AC
  Filled 2023-01-31: qty 5000

## 2023-01-31 MED ORDER — ORAL CARE MOUTH RINSE: 15.0000 mL | Freq: Once | OROMUCOSAL | Status: AC

## 2023-01-31 MED ORDER — METHOCARBAMOL 1000 MG/10ML IJ SOLN: 500.0000 mg | Freq: Four times a day (QID) | INTRAVENOUS | Status: DC | PRN

## 2023-01-31 MED ORDER — SODIUM CHLORIDE 0.9 % IV SOLN: INTRAVENOUS | Status: DC

## 2023-01-31 MED ORDER — SODIUM CHLORIDE 0.9 % IV SOLN: 250.0000 mL | INTRAVENOUS | Status: DC

## 2023-01-31 MED ORDER — LOSARTAN POTASSIUM 25 MG PO TABS
25.0000 mg | ORAL_TABLET | Freq: Every day | ORAL | Status: DC
  Administered 2023-01-31 – 2023-02-05 (×5): 25 mg via ORAL
  Filled 2023-01-31 (×6): qty 1

## 2023-01-31 MED ORDER — DOCUSATE SODIUM 100 MG PO CAPS
100.0000 mg | ORAL_CAPSULE | Freq: Every morning | ORAL | Status: DC
  Administered 2023-02-01 – 2023-02-05 (×4): 100 mg via ORAL
  Filled 2023-01-31 (×5): qty 1

## 2023-01-31 MED ORDER — LIDOCAINE 2% (20 MG/ML) 5 ML SYRINGE
INTRAMUSCULAR | Status: DC | PRN
  Administered 2023-01-31: 60 mg via INTRAVENOUS

## 2023-01-31 MED ORDER — PHENYLEPHRINE 80 MCG/ML (10ML) SYRINGE FOR IV PUSH (FOR BLOOD PRESSURE SUPPORT)
PREFILLED_SYRINGE | INTRAVENOUS | Status: AC
  Filled 2023-01-31: qty 10

## 2023-01-31 MED ORDER — CEFAZOLIN SODIUM-DEXTROSE 2-4 GM/100ML-% IV SOLN
2.0000 g | Freq: Three times a day (TID) | INTRAVENOUS | Status: AC
  Administered 2023-01-31 – 2023-02-01 (×2): 2 g via INTRAVENOUS
  Filled 2023-01-31 (×2): qty 100

## 2023-01-31 MED ORDER — 0.9 % SODIUM CHLORIDE (POUR BTL) OPTIME
TOPICAL | Status: DC | PRN
  Administered 2023-01-31: 1000 mL

## 2023-01-31 MED ORDER — ASPIRIN 81 MG PO TBEC: 81.0000 mg | DELAYED_RELEASE_TABLET | Freq: Every day | ORAL | Status: DC

## 2023-01-31 MED ORDER — FENTANYL CITRATE (PF) 250 MCG/5ML IJ SOLN
INTRAMUSCULAR | Status: DC | PRN
  Administered 2023-01-31 (×3): 50 ug via INTRAVENOUS

## 2023-01-31 MED ORDER — THROMBIN 20000 UNITS EX SOLR
CUTANEOUS | Status: AC
  Filled 2023-01-31: qty 20000

## 2023-01-31 SURGICAL SUPPLY — 85 items
ADH SKN CLS APL DERMABOND .7 (GAUZE/BANDAGES/DRESSINGS) ×1
BAG BANDED W/RUBBER/TAPE 36X54 (MISCELLANEOUS) ×2 IMPLANT
BAG COUNTER SPONGE SURGICOUNT (BAG) ×1 IMPLANT
BAG EQP BAND 135X91 W/RBR TAPE (MISCELLANEOUS) ×2
BAG SPNG CNTER NS LX DISP (BAG) ×1
BASKET BONE COLLECTION (BASKET) IMPLANT
BLADE BONE MILL MEDIUM (MISCELLANEOUS) IMPLANT
BUR CARBIDE MATCH 3.0 (BURR) ×1 IMPLANT
CEMENT KYPHON CX01A KIT/MIXER (Cement) IMPLANT
CNTNR URN SCR LID CUP LEK RST (MISCELLANEOUS) ×1 IMPLANT
CONT SPEC 4OZ STRL OR WHT (MISCELLANEOUS) ×1
COVER BACK TABLE 60X90IN (DRAPES) ×1 IMPLANT
DERMABOND ADVANCED .7 DNX12 (GAUZE/BANDAGES/DRESSINGS) ×1 IMPLANT
DRAIN JACKSON RD 7FR 3/32 (WOUND CARE) IMPLANT
DRAPE C-ARM 42X72 X-RAY (DRAPES) ×1 IMPLANT
DRAPE LAPAROTOMY 100X72X124 (DRAPES) ×1 IMPLANT
DRAPE MICROSCOPE SLANT 54X150 (MISCELLANEOUS) IMPLANT
DRAPE SURG 17X23 STRL (DRAPES) ×1 IMPLANT
DRSG OPSITE 4X5.5 SM (GAUZE/BANDAGES/DRESSINGS) ×1 IMPLANT
DRSG OPSITE POSTOP 4X10 (GAUZE/BANDAGES/DRESSINGS) IMPLANT
DRSG TELFA 3X8 NADH STRL (GAUZE/BANDAGES/DRESSINGS) IMPLANT
DURAPREP 26ML APPLICATOR (WOUND CARE) ×1 IMPLANT
ELECT BLADE INSULATED 4IN (ELECTROSURGICAL) ×1
ELECT COATED BLADE 2.86 ST (ELECTRODE) ×1 IMPLANT
ELECT REM PT RETURN 9FT ADLT (ELECTROSURGICAL) ×1
ELECTRODE BLADE INSULATED 4IN (ELECTROSURGICAL) ×1 IMPLANT
ELECTRODE REM PT RTRN 9FT ADLT (ELECTROSURGICAL) ×1 IMPLANT
EVACUATOR 1/8 PVC DRAIN (DRAIN) IMPLANT
GAUZE 4X4 16PLY ~~LOC~~+RFID DBL (SPONGE) IMPLANT
GAUZE PAD ABD 8X10 STRL (GAUZE/BANDAGES/DRESSINGS) ×1 IMPLANT
GAUZE SPONGE 4X4 12PLY STRL (GAUZE/BANDAGES/DRESSINGS) ×1 IMPLANT
GLOVE BIO SURGEON STRL SZ7 (GLOVE) ×2 IMPLANT
GLOVE BIOGEL PI IND STRL 7.0 (GLOVE) ×1 IMPLANT
GLOVE BIOGEL PI IND STRL 7.5 (GLOVE) ×1 IMPLANT
GLOVE BIOGEL PI IND STRL 8 (GLOVE) ×2 IMPLANT
GLOVE ECLIPSE 8.0 STRL XLNG CF (GLOVE) ×2 IMPLANT
GOWN STRL REUS W/ TWL LRG LVL3 (GOWN DISPOSABLE) IMPLANT
GOWN STRL REUS W/ TWL XL LVL3 (GOWN DISPOSABLE) ×2 IMPLANT
GOWN STRL REUS W/TWL 2XL LVL3 (GOWN DISPOSABLE) IMPLANT
GOWN STRL REUS W/TWL LRG LVL3 (GOWN DISPOSABLE)
GOWN STRL REUS W/TWL XL LVL3 (GOWN DISPOSABLE) ×2
KIT BASIN OR (CUSTOM PROCEDURE TRAY) ×1 IMPLANT
KIT POSITION SURG JACKSON T1 (MISCELLANEOUS) ×1 IMPLANT
KIT TURNOVER KIT B (KITS) ×1 IMPLANT
MARKER SKIN DUAL TIP RULER LAB (MISCELLANEOUS) ×1 IMPLANT
MILL BONE PREP (MISCELLANEOUS) IMPLANT
NDL HYPO 21X1.5 ECLIPSE (NEEDLE) ×1 IMPLANT
NDL HYPO 25X1 1.5 SAFETY (NEEDLE) ×1 IMPLANT
NDL RELINE-OR FENS 16 (NEEDLE) IMPLANT
NDL SPNL 20GX3.5 QUINCKE YW (NEEDLE) IMPLANT
NEEDLE HYPO 21X1.5 ECLIPSE (NEEDLE) ×1 IMPLANT
NEEDLE HYPO 25X1 1.5 SAFETY (NEEDLE) ×1 IMPLANT
NEEDLE RELINE-OR FENS 16 (NEEDLE) ×20 IMPLANT
NEEDLE SPNL 20GX3.5 QUINCKE YW (NEEDLE) ×1 IMPLANT
NS IRRIG 1000ML POUR BTL (IV SOLUTION) ×1 IMPLANT
PACK LAMINECTOMY NEURO (CUSTOM PROCEDURE TRAY) ×1 IMPLANT
PAD ARMBOARD 7.5X6 YLW CONV (MISCELLANEOUS) ×3 IMPLANT
PATTIES SURGICAL .5 X.5 (GAUZE/BANDAGES/DRESSINGS) IMPLANT
PATTIES SURGICAL .5 X1 (DISPOSABLE) IMPLANT
PATTIES SURGICAL 1X1 (DISPOSABLE) IMPLANT
PUSHER RELINE FENS 36 OR (MISCELLANEOUS) IMPLANT
PUSHER RELINE FENS MAS (MISCELLANEOUS) IMPLANT
PUTTY DBM INSTAFILL CART 5CC (Putty) IMPLANT
RELINE MAS NDL FENS (NEEDLE) IMPLANT
RELINE MAS NEEDLE FENS (NEEDLE) ×10 IMPLANT
ROD 200MM (Rod) ×2 IMPLANT
ROD SPNL 200X5.5XNS LF TI (Rod) IMPLANT
SCREW LOCK RELINE 5.5 TULIP (Screw) IMPLANT
SCREW PA RELINE 45X7.5 2 (Screw) IMPLANT
SCREW PA RELINE 7.5X45 (Screw) ×6 IMPLANT
SOL ELECTROSURG ANTI STICK (MISCELLANEOUS)
SOLUTION ELECTROSURG ANTI STCK (MISCELLANEOUS) ×1 IMPLANT
SPIKE FLUID TRANSFER (MISCELLANEOUS) ×1 IMPLANT
SPONGE SURGIFOAM ABS GEL 100 (HEMOSTASIS) ×1 IMPLANT
SPONGE T-LAP 4X18 ~~LOC~~+RFID (SPONGE) IMPLANT
STAPLER VISISTAT 35W (STAPLE) ×1 IMPLANT
SUT VIC AB 0 CT1 18XCR BRD8 (SUTURE) ×1 IMPLANT
SUT VIC AB 0 CT1 8-18 (SUTURE) ×2
SUT VIC AB 2-0 CP2 18 (SUTURE) ×1 IMPLANT
SUT VIC AB 3-0 SH 8-18 (SUTURE) ×1 IMPLANT
SYR 20ML LL LF (SYRINGE) ×1 IMPLANT
TOWEL GREEN STERILE (TOWEL DISPOSABLE) IMPLANT
TOWEL GREEN STERILE FF (TOWEL DISPOSABLE) IMPLANT
TRAY FOLEY MTR SLVR 16FR STAT (SET/KITS/TRAYS/PACK) ×1 IMPLANT
WATER STERILE IRR 1000ML POUR (IV SOLUTION) ×1 IMPLANT

## 2023-01-31 NOTE — Anesthesia Postprocedure Evaluation (Signed)
Anesthesia Post Note  Patient: Aaron Schmidt  Procedure(s) Performed: OPEN THORACIC EXTENSION FUSION THORACIC NINE-THORACIC TEN, THORACIC ELEVEN, REMOVAL HARDWARE THORACIC TWELVE WITH CEMENT AUGMENTATION (Spine Thoracic)     Patient location during evaluation: PACU Anesthesia Type: General Level of consciousness: awake and alert Pain management: pain level controlled Vital Signs Assessment: post-procedure vital signs reviewed and stable Respiratory status: spontaneous breathing, nonlabored ventilation, respiratory function stable and patient connected to nasal cannula oxygen Cardiovascular status: blood pressure returned to baseline and stable Postop Assessment: no apparent nausea or vomiting Anesthetic complications: yes   Encounter Notable Events  Notable Event Outcome Phase Comment  Difficult to intubate - expected  Intraprocedure Filed from anesthesia note documentation.    Last Vitals:  Vitals:   01/31/23 1345 01/31/23 1515  BP: (!) 142/67 129/62  Pulse: 92 88  Resp: 14   Temp: 36.6 C   SpO2: 96% 96%    Last Pain:  Vitals:   01/31/23 1345  TempSrc: Oral  PainSc: 5                  [name] Shelby Anderle

## 2023-01-31 NOTE — Transfer of Care (Signed)
Immediate Anesthesia Transfer of Care Note  Patient: Aaron Schmidt  Procedure(s) Performed: OPEN THORACIC EXTENSION FUSION THORACIC NINE-THORACIC TEN, THORACIC ELEVEN, REMOVAL HARDWARE THORACIC TWELVE WITH CEMENT AUGMENTATION (Spine Thoracic)  Patient Location: PACU  Anesthesia Type:Regional  Level of Consciousness: awake  Airway & Oxygen Therapy: Patient Spontanous Breathing  Post-op Assessment: Report given to RN and Post -op Vital signs reviewed and stable  Post vital signs: Reviewed and stable  Last Vitals:  Vitals Value Taken Time  BP 119/66 01/31/23 1105  Temp    Pulse 83 01/31/23 1108  Resp 11 01/31/23 1108  SpO2 100 % 01/31/23 1108  Vitals shown include unvalidated device data.  Last Pain:  Vitals:   01/31/23 0648  TempSrc:   PainSc: 8          Complications:  Encounter Notable Events  Notable Event Outcome Phase Comment  Difficult to intubate - expected  Intraprocedure Filed from anesthesia note documentation.

## 2023-01-31 NOTE — Op Note (Signed)
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 Kendell Bane C. Kardell Virgil, DO  ASSISTANT: Paticia Stack, PA  ANESTHESIA: General Endotracheal  EBL: 200 cc  SPECIMENS: None  DRAINS: Medium Hemovac  COMPLICATIONS: None  CONDITION: Hemodynamically stable  HISTORY: Aaron Schmidt is a 89 y.o. male who had a very large disc protrusion at T12-L1, underwent extension fusion to T12 with decompression and discectomy February 2024.  Initially did well and then began having worsening back pain and multiple falls.  CT of the thoracic spine revealed severe loosening of the bilateral T12 screws with left pedicle fracture at T12.  His pain was intolerable and he was unable to stand and walk due to severe pain therefore offered him surgical intervention in the form of extension of his instrumentation and fusion to T9 with cement augmentation with removal of hardware T12.  All risks benefits and expected outcomes were discussed and agreed upon, informed consent was obtained.  PROCEDURE IN DETAIL: The patient was brought to the operating room. After induction of general anesthesia, the patient was positioned on the operative table in the prone position. All pressure points were meticulously padded. Skin incision was then marked out and prepped and draped in the usual sterile fashion. Physician driven timeout was performed.  Local anesthetic  was injected into the planned incision.  Using a 10 blade, midline incision was made sharply over the T9-L1 spinous processes.  Using Bovie electrocautery, bilateral subperiosteal dissection was performed to expose the T9, T10, T11 lamina, transverse processes, as well as the bilateral T12 pedicle screws, rods and W connectors to the L1-2 construct.  The T12 pedicle screws were clearly loose, setscrews were removed, rods were removed and bilateral T12 pedicle screws were removed.  Then using AP and lateral fluoroscopy, a high-speed drill, a pilot hole was created and the T9 pedicles were accessed with pedicle finders.  A 5.5 mm tap was used to a depth of 40 mm, ball-tipped probe was used and noted to have bony prominences in all directions and a bony floor.  The above listed pedicle screws were placed bilaterally, this was repeated at T10 and T11 without difficulty.  AP and lateral fluoroscopy confirmed appropriate placement.  We then proceeded with cement augmentation bilaterally at T9, T10 and T11 using live lateral fluoroscopy.  Approximately 3 cc of cement were used at T9, T10 and T11.  There was a small amount of anterior vertebral body extravasation of cement at T9 that appeared to stay just in front of the vertebral body.  At this point cement was held at this level.  There is no other significant extravasation noted or retropulsion posteriorly.  Again AP and lateral fluoroscopy confirmed appropriate placement.  Appropriate size rods were then measured, cut and contoured and placed from T9-L1-2.  Setscrews were placed and final tightened to the manufacturer's recommendation.  Using high-speed drill, the T9-10, T10-11, T11-12 lamina facet and pars were decorticated with a high-speed drill and allograft was placed thus completing the posterolateral fusion.  Retractors  were taken out of the wound.  Soft tissue hemostasis was achieved with monopolar cautery.  Medium VAC was tunneled laterally and placed deep in  the wound.  The wound was then closed in layers with 0 Vicryl suture for muscle and fascia.  Dermis was closed with 2-0 and 3-0 Vicryl sutures.  Skin was closed with skin glue.  Sterile dressing was applied.  At the end of the case all sponge, needle, and instrument counts were correct. The patient was then transferred to the stretcher, extubated, and taken to the post-anesthesia care unit in stable hemodynamic condition.

## 2023-01-31 NOTE — H&P (Addendum)
Providing Compassionate, Quality Care - Together  NEUROSURGERY HISTORY & PHYSICAL   Aaron Schmidt is an 89 y.o. male.   Chief Complaint: Mid back pain HPI: This is an 89 year old male, with a recent history of T12-L1 extension fusion, decompression, with complaints of worsening back pain and difficulty standing due to severe pain.  CT scan revealed loosening of the hardware at T12 and a left T12 fracture of the pedicle.  Denies radiculopathy and weakness. Therefore he presents today for surgical intervention in the form of extension fusion to T9, 10, 11 with cement augmentation.  Past Medical History:  Diagnosis Date   Acute kidney injury superimposed on chronic kidney disease (HCC) 09/05/2017   Anemia 09/21/2017   Anemia due to chronic blood loss 01/10/2021   Arthritis    Degeneration spine & stenosis   Benign prostatic hyperplasia 01/10/2021   Benign prostatic hyperplasia with lower urinary tract symptoms 01/10/2021   Body mass index (BMI) 25.0-25.9, adult 08/31/2019   CAD (coronary artery disease) 04/20/2019   Cervical myelopathy with cervical radiculopathy (HCC) 06/24/2014   Cervical spondylosis without myelopathy 05/12/2014   Chronic bilateral low back pain 07/26/2020   Chronic kidney disease, stage 3 unspecified (HCC) 01/10/2021   Chronic low back pain 07/29/2013   Chronic pain 09/05/2017   Degenerative cervical spinal stenosis 05/26/2014   Degenerative lumbar spinal stenosis 09/22/2019   Displacement of cervical intervertebral disc 07/19/2014   Edema 01/10/2021   Elevated troponin 09/05/2017   Empyema lung (HCC) 09/09/2017   Enlarged prostate 01/10/2021   Essential hypertension 08/29/2010   Qualifier: Diagnosis of  By: Vassie Loll MD, Comer Locket     Fall in home 07/10/2017   Hardening of the aorta (main artery of the heart) (HCC) 01/10/2021   History of kidney stones    Hyperlipidemia 06/21/2014   Hypertension    has a histroy of   Hypoalbuminemia 01/10/2021   Hyponatremia 09/05/2017    Hypotension    Impaired fasting glucose 01/10/2021   Intermittent tremor 01/10/2021   Iron deficiency anemia 01/10/2021   Kidney stone 01/10/2021   LBBB (left bundle branch block) 06/21/2014   Lumbar post-laminectomy syndrome 07/15/2013   Lumbar radiculopathy 07/29/2013   Lumbar stenosis with neurogenic claudication 08/31/2019   Malnutrition of moderate degree 03/23/2019   Mitral valve disorder 01/10/2021   Murmur 01/08/2017   Muscle pain 03/01/2020   Myocardial infarction (HCC) 03/21/2019   Neck pain 03/03/2014   Normochromic normocytic anemia 09/05/2017   NSTEMI (non-ST elevated myocardial infarction) (HCC) 03/21/2019   Obesity 04/06/2019   Obstructive sleep apnea 09/30/2010   PSG (180 lbs ) >> severe obstructive sleep apnea with AHi 38/h & desatns to 82%  Started on auto 5-15  >> changed to 8 cm Download reviewed 1/21 - 11/23/10 >> residual events of 6/h , avg pr 8 cm, leak + with nasal pillows     Occasional tremors 07/26/2020   Old myocardial infarction 01/10/2021   Osteoarthrosis 01/10/2021   Osteopenia 01/10/2021   Other specified disorders of kidney and ureter 01/10/2021   Parkinson's disease    Peripheral edema 01/10/2021   Peripheral neuralgia 01/10/2021   Personal history of colonic polyps 01/10/2021   Personal history of other diseases of the respiratory system 01/10/2021   Pleural effusion on left 08/19/2017   Acute symptoms since 08/13/17 > CTa  08/13/17  neg pe/ extensively loculated effusion - L thoracentesis 08/20/2017 :  220 cc with glucose < 20 and WBC 5,338 mostly Poly, LDH 688 >  cyt neg >Referrd to T surgery > VATS 09/09/17 c/w empyema   Pleural effusion, left 09/04/2017   Preoperative cardiovascular examination 06/21/2014   Protein-calorie malnutrition, severe 09/11/2017   Pure hypercholesterolemia 01/10/2021   Sciatica 01/10/2021   Scoliosis concern 08/31/2019   Sensorineural hearing loss (SNHL) of both ears 06/15/2020   Last Assessment & Plan:  Formatting of this note might be  different from the original. Concern over worsening hearing. Chronic history of sensorineural hearing loss bilaterally.  Has hearing aids currently.  He feels his hearing aids are not working as well as they historically have.  Recently had them checked. EXAM shows normal external canals and tympanic membranes bilaterally. AUDIOGRAM Shows B   Shoulder joint pain 12/01/2013   Sinusitis, chronic 08/19/2017   CT sinus  08/20/2017 >>> Normally aerated paranasal sinuses.  Patent sinus drainage pathways.   SIRS (systemic inflammatory response syndrome) (HCC) 09/05/2017   Skin ulcer of toe of left foot, limited to breakdown of skin (HCC) 09/04/2016   Sleep apnea 2012   used CPAP 2 yrs. ago, feels he sleeps better w/o, no longer using    Spinal stenosis of lumbar region 08/10/2013   Spinal stenosis, lumbar region without neurogenic claudication 08/19/2019   Symptomatic anemia 09/20/2017   Thrombocytopenia (HCC) 01/10/2021   Tubular adenoma of colon 01/10/2021   Unspecified abnormal finding in specimens from other organs, systems and tissues 01/10/2021    Past Surgical History:  Procedure Laterality Date   ANTERIOR LAT LUMBAR FUSION N/A 09/22/2019   Procedure: Lumbar one-two, Lumbar two-three Anterolateral lumbar interbody fusion with pedicle screw fixation and exploration of adjacent fusion;  Surgeon: Maeola Harman, MD;  Location: [facility] OR;  Service: Neurosurgery;  Laterality: N/A;   ARM NEUROPLASTY     at 14 yrs. of age- fell off tractor- had fracture & repair *& later- 1990's had  transplantation of a nerve at the elbow   BACK SURGERY     x4 back surgery x2 fusion -   BACK SURGERY  11/01/2022   T12-L1 posterior lateral instrumentation and fusion   CARDIAC CATHETERIZATION  03/23/2019   CARPAL TUNNEL RELEASE Right    COLONOSCOPY     ESOPHAGOGASTRODUODENOSCOPY (EGD) WITH PROPOFOL N/A 09/23/2017   Procedure: ESOPHAGOGASTRODUODENOSCOPY (EGD) WITH PROPOFOL;  Surgeon: Carman Ching, MD;  Location: [facility]  ENDOSCOPY;  Service: Endoscopy;  Laterality: N/A;   LEFT HEART CATH AND CORONARY ANGIOGRAPHY N/A 03/23/2019   Procedure: LEFT HEART CATH AND CORONARY ANGIOGRAPHY;  Surgeon: Kathleene Hazel, MD;  Location: [facility] INVASIVE CV LAB;  Service: Cardiovascular;  Laterality: N/A;   LUMBAR PERCUTANEOUS PEDICLE SCREW 2 LEVEL N/A 09/22/2019   Procedure: LUMBAR PERCUTANEOUS PEDICLE SCREW TWO LEVEL;  Surgeon: Maeola Harman, MD;  Location: [facility] OR;  Service: Neurosurgery;  Laterality: N/A;   PLEURAL EFFUSION DRAINAGE Left 09/09/2017   Procedure: DRAINAGE OF PLEURAL EFFUSION;  Surgeon: Loreli Slot, MD;  Location: [facility] OR;  Service: Thoracic;  Laterality: Left;   POSTERIOR CERVICAL FUSION/FORAMINOTOMY N/A 06/24/2014   Procedure: Posterior Cervical Three-Seven Fusion with Lateral Mass Fixation;  Surgeon: Maeola Harman, MD;  Location: [facility] NEURO ORS;  Service: Neurosurgery;  Laterality: N/A;  C3-C7 posterior cervical fusion with lateral mass fixation   SHOULDER SURGERY Bilateral    VIDEO ASSISTED THORACOSCOPY (VATS)/DECORTICATION Left 09/09/2017   Procedure: VIDEO ASSISTED THORACOSCOPY (VATS)/DECORTICATION;  Surgeon: Loreli Slot, MD;  Location: [facility] OR;  Service: Thoracic;  Laterality: Left;   VIDEO BRONCHOSCOPY  09/09/2017   Procedure: VIDEO BRONCHOSCOPY;  Surgeon: Loreli Slot,  MD;  Location: [facility] OR;  Service: Thoracic;;    Family History  Problem Relation Age of Onset   Pancreatic cancer Mother    Leukemia Son    Social History:  reports that he quit smoking about 44 years ago. His smoking use included pipe. He has never used smokeless tobacco. He reports that he does not currently use alcohol. He reports that he does not use drugs.  Allergies:  Allergies  Allergen Reactions   Baclofen Other (See Comments)    CONFUSION, BODY TREMORS   Diazepam Other (See Comments)    "makes goofy" Other reaction(s): confusion   Doxazosin Mesylate Other (See Comments)    Other reaction(s): low  BP    Medications Prior to Admission  Medication Sig Dispense Refill   aspirin EC 81 MG tablet Take 1 tablet (81 mg total) by mouth daily. 30 tablet 11   Calcium-Magnesium-Zinc (CAL-MAG-ZINC PO) Take 1 tablet by mouth daily.     carbidopa-levodopa (SINEMET IR) 25-100 MG tablet 2 tablets at 8am/2 tablets at noon/2 at 4pm (Patient taking differently: Take 1-2 tablets by mouth See admin instructions. Take 2 tablets by mouth at 0700, take 2 tablets by mouth at 1100, take 2 tablet by mouth at 1600 & take 2 tablet by at 1900.) 540 tablet 1   Carbidopa-Levodopa ER (SINEMET CR) 25-100 MG tablet controlled release Take 1 tablet by mouth at bedtime. 90 tablet 1   Cholecalciferol (VITAMIN D3) 50 MCG (2000 UT) capsule Take 2,000 Units by mouth daily.     docusate sodium (COLACE) 100 MG capsule Take 100 mg by mouth in the morning.     finasteride (PROSCAR) 5 MG tablet Take 5 mg by mouth in the morning.     fluocinonide cream (LIDEX) 0.05 % Apply 1 application topically daily as needed (itching).     furosemide (LASIX) 20 MG tablet Take 20 mg by mouth every Monday, Wednesday, and Friday. In the morning     HYDROmorphone (DILAUDID) 2 MG tablet Take 1 tablet (2 mg total) by mouth every 5 (five) hours as needed for moderate pain or severe pain. (Patient taking differently: Take 2 mg by mouth every 4 (four) hours as needed for moderate pain or severe pain.) 30 tablet 0   losartan (COZAAR) 25 MG tablet Take 1 tablet (25 mg total) by mouth daily. 90 tablet 3   Multiple Vitamin (MULTIVITAMIN WITH MINERALS) TABS tablet Take 1 tablet by mouth in the morning. Mature mens 50+     oxyCODONE (OXY IR/ROXICODONE) 5 MG immediate release tablet Take 1 tablet (5 mg total) by mouth every 6 (six) hours as needed for severe pain ((score 4 to 6)). (Patient taking differently: Take 5 mg by mouth every 4 (four) hours as needed for severe pain ((score 4 to 6)).) 30 tablet 0   Polyethyl Glycol-Propyl Glycol (LUBRICANT EYE DROPS) 0.4-0.3  % SOLN Place 1-2 drops into both eyes 3 (three) times daily as needed (dry/irritated eyes.).     pyridOXINE (VITAMIN B-6) 100 MG tablet Take 100 mg by mouth in the morning.     sodium chloride (OCEAN) 0.65 % SOLN nasal spray Place 1 spray into both nostrils at bedtime as needed for congestion.     SUPER B COMPLEX/C PO Take 1 capsule by mouth in the morning.     traZODone (DESYREL) 150 MG tablet Take 150 mg by mouth at bedtime.     vitamin B-12 (CYANOCOBALAMIN) 1000 MCG tablet Take 1,000 mcg by mouth in the morning.  vitamin C (ASCORBIC ACID) 500 MG tablet Take 500 mg by mouth in the morning.     L-LYSINE PO Take 500 mg by mouth in the morning.     magnesium hydroxide (MILK OF MAGNESIA) 400 MG/5ML suspension Take 5 mLs by mouth daily as needed for mild constipation.     methocarbamol (ROBAXIN) 500 MG tablet Take 1 tablet (500 mg total) by mouth every 6 (six) hours as needed for muscle spasms. 90 tablet 2   mineral oil liquid Take 90 mLs by mouth daily in the afternoon. 6 tablespoons in the afternoon.     nortriptyline (PAMELOR) 25 MG capsule Take 25 mg by mouth at bedtime.      pregabalin (LYRICA) 150 MG capsule Take 150 mg by mouth in the morning.     Sennosides 15 MG TABS Take 45 mg by mouth in the morning. Perdiem Stimulant Laxative Tablets     simvastatin (ZOCOR) 40 MG tablet Take 40 mg by mouth at bedtime.     tamsulosin (FLOMAX) 0.4 MG CAPS capsule Take 0.8 mg by mouth daily.     tiZANidine (ZANAFLEX) 2 MG tablet Take 2 mg by mouth every 8 (eight) hours as needed for muscle spasms.      No results found for this or any previous visit (from the past 48 hour(s)). No results found.  ROS All pertinent positives and negatives are listed in HPI above  Blood pressure (!) 125/58, pulse 81, temperature 97.6 F (36.4 C), temperature source Oral, resp. rate 18, height 5\' 10"  (1.778 m), weight 72.6 kg, SpO2 97 %. Physical Exam  Awake alert, no acute distress PERRLA Speech fluent and  appropriate Bilateral upper extremities full strength throughout Bilateral lower extremities 4/5 throughout Bilateral lower extremity small pitting edema Face symmetric  Assessment/Plan 89 year old male with  T12-L1 nonunion with loosening T12 screws  -OR today for extension fusion to T9 with cement augmentation, removal of T12 screws.  We discussed all risks, benefits and expected outcomes as well as alternatives to treatment.  I discussed all these with his wife as well as bedside.  They would like to proceed with surgical intervention.  Informed consent was obtained and witnessed  Thank you for allowing me to participate in this patient's care.  Please do not hesitate to call with questions or concerns.   Monia Pouch, DO Neurosurgeon [facility] Cell: [phone]

## 2023-01-31 NOTE — Anesthesia Procedure Notes (Signed)
Procedure Name: Intubation Date/Time: 01/31/2023 7:56 AM  Performed by: Katina Degree, CRNAPre-anesthesia Checklist: Patient identified, Emergency Drugs available, Suction available and Patient being monitored Patient Re-evaluated:Patient Re-evaluated prior to induction Oxygen Delivery Method: Circle system utilized Preoxygenation: Pre-oxygenation with 100% oxygen Induction Type: IV induction Ventilation: Mask ventilation without difficulty Laryngoscope Size: Glidescope and 3 Grade View: Grade I Tube type: Oral Tube size: 7.5 mm Number of attempts: 1 Airway Equipment and Method: Stylet and Oral airway Placement Confirmation: ETT inserted through vocal cords under direct vision, positive ETCO2 and breath sounds checked- equal and bilateral Secured at: 23 cm Tube secured with: Tape Dental Injury: Teeth and Oropharynx as per pre-operative assessment  Difficulty Due To: Difficulty was anticipated, Difficult Airway- due to reduced neck mobility and Difficult Airway- due to limited oral opening

## 2023-01-31 NOTE — Progress Notes (Signed)
Orthopedic Tech Progress Note Patient Details:  Aaron Schmidt 04-02-1933 [id number]  Order for TLSO VertAlign for Dr. Mattie Marlin pt was called into [facility]  Patient ID: Aaron Schmidt, male   DOB: 06-Aug-1933, 89 y.o.   MRN: [id number]  [name] 01/31/2023, 3:26 PM

## 2023-02-01 ENCOUNTER — Encounter (HOSPITAL_COMMUNITY): Payer: Self-pay | Admitting: Neurological Surgery

## 2023-02-01 MED ORDER — POLYVINYL ALCOHOL 1.4 % OP SOLN
1.0000 [drp] | OPHTHALMIC | Status: DC | PRN
  Administered 2023-02-01 (×2): 1 [drp] via OPHTHALMIC
  Filled 2023-02-01 (×2): qty 15

## 2023-02-01 MED FILL — Thrombin For Soln 5000 Unit: CUTANEOUS | Qty: 5000 | Status: AC

## 2023-02-01 NOTE — Evaluation (Signed)
Physical Therapy Evaluation Patient Details Name: Aaron Schmidt MRN: [id number] DOB: May 01, 1933 Today's Date: 02/01/2023  History of Present Illness  Pt is an 89 y.o. male who presented 5/10 for open thoracic extension fusion of T9-11, removal of T12 screws. CT with T12 hardware loosening and L T12 fracture. PMHx: HTN, CAD, MI, OA, anemia, CKD stage 3, HLD, OSA, Parkinson's disease, peripheral neuralgia.   Clinical Impression  Pt presents with condition above and deficits mentioned below, see PT Problem List. Pt is currently demonstrating moments of confusion, so he may not be a great historian at this time. However, he reports he recently discharged back to his ILF with his wife after being at a SNF for rehab. He recently has been mod I with w/c mobility and transfers and needing +1 assist to ambulate with a RW and w/c follow. Currently, pt is requiring maxA to roll, modAx2 to transition sideling <> sit EOB, modAx2 to transfer to stand from an elevated surface, and maxAx2 to take a couple side steps along EOB with RW support. Pt demonstrates a posterior lean in standing along with anxiety, reporting a fear of falling. Pt displays deficits in cognition, balance, activity tolerance, and gross strength. Pt would benefit from less intensive inpatient rehab, < 3 hours/day. Will continue to follow acutely.     Recommendations for follow up therapy are one component of a multi-disciplinary discharge planning process, led by the attending physician.  Recommendations may be updated based on patient status, additional functional criteria and insurance authorization.  Follow Up Recommendations Can patient physically be transported by private vehicle: No     Assistance Recommended at Discharge Frequent or constant Supervision/Assistance  Patient can return home with the following  Two people to help with walking and/or transfers;A lot of help with bathing/dressing/bathroom;Assistance with  cooking/housework;Direct supervision/assist for medications management;Direct supervision/assist for financial management;Assist for transportation;Help with stairs or ramp for entrance    Equipment Recommendations Other (comment) (defer to next venue of care)  Recommendations for Other Services       Functional Status Assessment Patient has had a recent decline in their functional status and demonstrates the ability to make significant improvements in function in a reasonable and predictable amount of time.     Precautions / Restrictions Precautions Precautions: Fall;Back Precaution Booklet Issued: No Precaution Comments: hemovac; reviewed and provided cues to maintain precautions during session Required Braces or Orthoses: Spinal Brace Spinal Brace: Thoracolumbosacral orthotic;Applied in sitting position (clamshell) Restrictions Weight Bearing Restrictions: No      Mobility  Bed Mobility Overal bed mobility: Needs Assistance Bed Mobility: Rolling, Sidelying to Sit, Sit to Sidelying Rolling: Max assist Sidelying to sit: Mod assist, +2 for physical assistance, +2 for safety/equipment, HOB elevated     Sit to sidelying: Mod assist, +2 for physical assistance, +2 for safety/equipment, HOB elevated General bed mobility comments: Pt asking to enter/exit R side of bed as this is how he does it at home. Pt cued to reach to R bed rail with bil UEs to roll, needing maxA using the bed pad to rotate body/hips. Extensive assistance to bring bil legs off EOB but once the legs were off he did assist in ascending his trunk, modAx2 to sit up. ModAx2 to lift legs and control trunk from sit to sidelying, needing cues to lean laterally onto R elbow to lay down.    Transfers Overall transfer level: Needs assistance Equipment used: Rolling walker (2 wheels) Transfers: Sit to/from Stand Sit to Stand: Mod assist, +2  physical assistance, From elevated surface, +2 safety/equipment            General transfer comment: ModAx2 to shift weight anteriorly, provide balance, and power pt up to stand from elevated EOB, providing cues for hand placement. Pt leans posteriorly but maintains flexed hips while pulling the anterior aspect of the RW up off the ground despite max repeated multi-modal cues to correct. MaxAx2 to shift weight and advance legs to take a few steps to R along EOB towards HOB with RW support.    Ambulation/Gait Ambulation/Gait assistance: Max assist, +2 physical assistance, +2 safety/equipment Gait Distance (Feet): 2 Feet Assistive device: Rolling walker (2 wheels) Gait Pattern/deviations: Step-to pattern, Decreased step length - right, Decreased step length - left, Decreased stride length, Decreased dorsiflexion - right, Decreased dorsiflexion - left, Decreased weight shift to right, Decreased weight shift to left, Trunk flexed, Leaning posteriorly, Shuffle Gait velocity: reduced Gait velocity interpretation: <1.31 ft/sec, indicative of household ambulator   General Gait Details: Small steps along EOB with maxAx2 to shift weight, advance feet, and manage RW while providing balance.  Stairs            Wheelchair Mobility    Modified Rankin (Stroke Patients Only)       Balance Overall balance assessment: Needs assistance Sitting-balance support: Bilateral upper extremity supported, Feet supported Sitting balance-Leahy Scale: Poor Sitting balance - Comments: UE support to sit EOB with min guard-minA Postural control: Posterior lean Standing balance support: Bilateral upper extremity supported, During functional activity, Reliant on assistive device for balance Standing balance-Leahy Scale: Poor Standing balance comment: Mod-maxAx2 to stand with RW support, maintaining a posterior lean but very flexed hips                             Pertinent Vitals/Pain Pain Assessment Pain Assessment: Faces Faces Pain Scale: Hurts even more Pain  Location: back Pain Descriptors / Indicators: Discomfort, Grimacing, Guarding, Moaning Pain Intervention(s): Limited activity within patient's tolerance, Monitored during session, Premedicated before session, Patient requesting pain meds-RN notified, Repositioned    Home Living Family/patient expects to be discharged to:: Other (Comment) ([facility])                   Additional Comments: Level entry with elevator access to their apartment, wife is available 24/7, walk-in shower with comfort height toilets. Pt has a RW, rollator, quad cane, SPC, shower seat-built in, grab bars - at toilet & shower, wheelchair, and transport chair    Prior Function Prior Level of Function : Needs assist             Mobility Comments: Since going to SNF: pt reports he was mod I propelling his w/c and performing transfers with a RW but needed +1 assist to ambulate with a RW and w/c follow. Per entry 11/02/22: Mod I holding onto walls/furniture for support in home, used rollator vs cane outside with wife intermittently pushing pt in transport chair as needed ADLs Comments: Per entry 11/02/22: Wife assists with managing shoes and intermittently assists with donning pants and depends depending on how well his L UE is moving (hx of injury since childhood limiting ROM).     Hand Dominance   Dominant Hand: Right    Extremity/Trunk Assessment   Upper Extremity Assessment Upper Extremity Assessment: Defer to OT evaluation    Lower Extremity Assessment Lower Extremity Assessment: Generalized weakness    Cervical / Trunk Assessment Cervical /  Trunk Assessment: Back Surgery  Communication   Communication: HOH  Cognition Arousal/Alertness: Awake/alert Behavior During Therapy: Anxious Overall Cognitive Status: Impaired/Different from baseline Area of Impairment: Memory, Safety/judgement, Attention, Following commands, Awareness, Problem solving                   Current Attention Level:  Sustained Memory: Decreased short-term memory, Decreased recall of precautions Following Commands: Follows one step commands consistently, Follows one step commands with increased time, Follows multi-step commands inconsistently, Follows multi-step commands with increased time Safety/Judgement: Decreased awareness of safety, Decreased awareness of deficits Awareness: Emergent Problem Solving: Slow processing, Decreased initiation, Difficulty sequencing, Requires verbal cues, Requires tactile cues General Comments: Pt would have moments of confusion, one moment telling us he was at [facility] and another asking "Have you all introduced yourself to the director of this apartment yet?". Poor awareness of his precautions and needing repeated multi-modal cues to sequence all mobility. Pt anxious, stating "I'm falling" even though repeatedly encouraged that the therapists were supporting him safely. Poor motor planning and awareness to reduce his posterior lean, push his RW down into the ground rather than pull it up, and stand upright.        General Comments      Exercises     Assessment/Plan    PT Assessment Patient needs continued PT services  PT Problem List Decreased strength;Decreased activity tolerance;Decreased balance;Decreased mobility;Decreased cognition;Decreased coordination;Decreased knowledge of precautions;Pain       PT Treatment Interventions DME instruction;Gait training;Functional mobility training;Therapeutic activities;Therapeutic exercise;Balance training;Neuromuscular re-education;Patient/family education;Wheelchair mobility training;Cognitive remediation    PT Goals (Current goals can be found in the Care Plan section)  Acute Rehab PT Goals Patient Stated Goal: to return to his life PT Goal Formulation: With patient Time For Goal Achievement: 02/15/23 Potential to Achieve Goals: Fair    Frequency Min 5X/week     Co-evaluation PT/OT/SLP Co-Evaluation/Treatment:  Yes Reason for Co-Treatment: Necessary to address cognition/behavior during functional activity;For patient/therapist safety;To address functional/ADL transfers PT goals addressed during session: Mobility/safety with mobility;Balance;Proper use of DME         AM-PAC PT "6 Clicks" Mobility  Outcome Measure Help needed turning from your back to your side while in a flat bed without using bedrails?: A Lot Help needed moving from lying on your back to sitting on the side of a flat bed without using bedrails?: Total Help needed moving to and from a bed to a chair (including a wheelchair)?: Total Help needed standing up from a chair using your arms (e.g., wheelchair or bedside chair)?: Total Help needed to walk in hospital room?: Total Help needed climbing 3-5 steps with a railing? : Total 6 Click Score: 7    End of Session Equipment Utilized During Treatment: Gait belt;Back brace Activity Tolerance: Other (comment);Patient limited by pain (limited by anxiety) Patient left: in bed;with call bell/phone within reach;with bed alarm set Nurse Communication: Mobility status;Need for lift equipment;Patient requests pain meds PT Visit Diagnosis: Unsteadiness on feet (R26.81);Other abnormalities of gait and mobility (R26.89);Muscle weakness (generalized) (M62.81);Difficulty in walking, not elsewhere classified (R26.2);Pain Pain - part of body:  (back)    Time: 7829-5621 PT Time Calculation (min) (ACUTE ONLY): 31 min   Charges:   PT Evaluation $PT Eval Moderate Complexity: 1 Mod          Raymond Gurney, PT, DPT Acute Rehabilitation Services  Office: [phone]   Jewel Baize 02/01/2023, 1:58 PM

## 2023-02-01 NOTE — Plan of Care (Signed)
  Problem: Nutrition: Goal: Adequate nutrition will be maintained Outcome: Not Progressing   Problem: Clinical Measurements: Goal: Ability to maintain clinical measurements within normal limits will improve Outcome: Not Progressing   Problem: Pain Managment: Goal: General experience of comfort will improve Outcome: Not Progressing   Problem: Safety: Goal: Ability to remain free from injury will improve Outcome: Not Progressing

## 2023-02-01 NOTE — Evaluation (Signed)
Occupational Therapy Evaluation Patient Details Name: Aaron Schmidt MRN: [id number] DOB: 1933/01/26 Today's Date: 02/01/2023   History of Present Illness Pt is an 89 y.o. male who presented 5/10 for open thoracic extension fusion of T9-11, removal of T12 screws. CT with T12 hardware loosening and L T12 fracture. PMHx: HTN, CAD, MI, OA, anemia, CKD stage 3, HLD, OSA, Parkinson's disease, peripheral neuralgia.   Clinical Impression   Patient admitted for above and presents with problem list below.  Patient with recent back surgery reports continued need assist for some LB ADLs after returning back to ILF from SNF, but able to transfer and ambulate with chair follow using his RW. Patient currently limited by back pain, weakness, and cognition.  Pt initially oriented but pleasantly confused during session, at times reporting being at an apartment?, but able to follow simple commands with increased time although very fearful of falling.  Currently required mod assist +2 for bed mobility, mod assist +2 to stand and max assist +2 to side step at EOB; requiring setup to total assist +2 for ADLs.  Based on performance today, believe he will best benefit from further OT services acutely and after dc at SNF level to optimize independence, safety and return to PLOF.      Recommendations for follow up therapy are one component of a multi-disciplinary discharge planning process, led by the attending physician.  Recommendations may be updated based on patient status, additional functional criteria and insurance authorization.   Assistance Recommended at Discharge Frequent or constant Supervision/Assistance  Patient can return home with the following Help with stairs or ramp for entrance;Assist for transportation;Direct supervision/assist for financial management;Direct supervision/assist for medications management;Assistance with feeding;Assistance with cooking/housework;A lot of help with  bathing/dressing/bathroom;Two people to help with walking and/or transfers    Functional Status Assessment  Patient has had a recent decline in their functional status and demonstrates the ability to make significant improvements in function in a reasonable and predictable amount of time.  Equipment Recommendations  Other (comment) (defer)    Recommendations for Other Services       Precautions / Restrictions Precautions Precautions: Fall;Back Precaution Booklet Issued: No Precaution Comments: hemovac; reviewed and provided cues to maintain precautions during session Required Braces or Orthoses: Spinal Brace Spinal Brace: Thoracolumbosacral orthotic;Applied in sitting position (clamshell) Restrictions Weight Bearing Restrictions: No      Mobility Bed Mobility Overal bed mobility: Needs Assistance Bed Mobility: Rolling, Sidelying to Sit, Sit to Sidelying Rolling: Max assist Sidelying to sit: Mod assist, +2 for physical assistance, +2 for safety/equipment, HOB elevated     Sit to sidelying: Mod assist, +2 for physical assistance, +2 for safety/equipment, HOB elevated General bed mobility comments: Pt asking to enter/exit R side of bed as this is how he does it at home. Pt cued to reach to R bed rail with bil UEs to roll, needing maxA using the bed pad to rotate body/hips. Extensive assistance to bring bil legs off EOB but once the legs were off he did assist in ascending his trunk, modAx2 to sit up. ModAx2 to lift legs and control trunk from sit to sidelying, needing cues to lean laterally onto R elbow to lay down.    Transfers Overall transfer level: Needs assistance Equipment used: Rolling walker (2 wheels) Transfers: Sit to/from Stand Sit to Stand: Mod assist, +2 physical assistance, From elevated surface, +2 safety/equipment           General transfer comment: ModAx2 to shift weight anteriorly, provide balance,  and power pt up to stand from elevated EOB, providing cues  for hand placement. Pt leans posteriorly but maintains flexed hips while pulling the anterior aspect of the RW up off the ground despite max repeated multi-modal cues to correct. MaxAx2 to shift weight and advance legs to take a few steps to R along EOB towards HOB with RW support.      Balance Overall balance assessment: Needs assistance Sitting-balance support: Bilateral upper extremity supported, Feet supported Sitting balance-Leahy Scale: Poor Sitting balance - Comments: UE support to sit EOB with min guard-minA Postural control: Posterior lean Standing balance support: Bilateral upper extremity supported, During functional activity, Reliant on assistive device for balance Standing balance-Leahy Scale: Poor Standing balance comment: Mod-maxAx2 to stand with RW support, maintaining a posterior lean but very flexed hips                           ADL either performed or assessed with clinical judgement   ADL Overall ADL's : Needs assistance/impaired     Grooming: Set up;Sitting           Upper Body Dressing : Moderate assistance;Sitting Upper Body Dressing Details (indicate cue type and reason): total for brace Lower Body Dressing: Total assistance;+2 for physical assistance;+2 for safety/equipment;Sit to/from stand   Toilet Transfer: +2 for physical assistance;+2 for safety/equipment;Rolling walker (2 wheels);Total assistance Toilet Transfer Details (indicate cue type and reason): simulated side stepping to [facility]         Functional mobility during ADLs: +2 for physical assistance;+2 for safety/equipment;Moderate assistance;Maximal assistance       Vision   Vision Assessment?: No apparent visual deficits     Perception     Praxis      Pertinent Vitals/Pain Pain Assessment Pain Assessment: Faces Faces Pain Scale: Hurts even more Pain Location: back Pain Descriptors / Indicators: Discomfort, Grimacing, Guarding, Moaning Pain Intervention(s): Limited  activity within patient's tolerance, Monitored during session, Repositioned     Hand Dominance Right   Extremity/Trunk Assessment Upper Extremity Assessment Upper Extremity Assessment: Generalized weakness;LUE deficits/detail LUE Deficits / Details: reports hx of L UE injury as a child, weak and difficult to use   Lower Extremity Assessment Lower Extremity Assessment: Defer to PT evaluation   Cervical / Trunk Assessment Cervical / Trunk Assessment: Back Surgery   Communication Communication Communication: HOH   Cognition Arousal/Alertness: Awake/alert Behavior During Therapy: Anxious Overall Cognitive Status: History of cognitive impairments - at baseline Area of Impairment: Memory, Safety/judgement, Attention, Following commands, Awareness, Problem solving, Orientation                 Orientation Level: Disoriented to, Place, Situation Current Attention Level: Sustained Memory: Decreased short-term memory, Decreased recall of precautions Following Commands: Follows one step commands consistently, Follows one step commands with increased time, Follows multi-step commands inconsistently, Follows multi-step commands with increased time Safety/Judgement: Decreased awareness of safety, Decreased awareness of deficits Awareness: Emergent Problem Solving: Slow processing, Decreased initiation, Difficulty sequencing, Requires verbal cues, Requires tactile cues General Comments: Pt would have moments of confusion, one moment telling us he was at [facility] and another asking "Have you all introduced yourself to the director of this apartment yet?". Poor awareness of his precautions and needing repeated multi-modal cues to sequence all mobility. Pt anxious, stating "I'm falling" even though repeatedly encouraged that the therapists were supporting him safely. Poor motor planning and awareness to reduce his posterior lean, push his RW down into the ground  rather than pull it up, and stand  upright.     General Comments       Exercises     Shoulder Instructions      Home Living Family/patient expects to be discharged to:: Other (Comment) ([facility])                                 Additional Comments: Level entry with elevator access to their apartment, wife is available 24/7, walk-in shower with comfort height toilets. Pt has a RW, rollator, quad cane, SPC, shower seat-built in, grab bars - at toilet & shower, wheelchair, and transport chair      Prior Functioning/Environment Prior Level of Function : Needs assist;Patient poor historian/Family not available             Mobility Comments: Since going to SNF: pt reports he was mod I propelling his w/c and performing transfers with a RW but needed +1 assist to ambulate with a RW and w/c follow. Per entry 11/02/22: Mod I holding onto walls/furniture for support in home, used rollator vs cane outside with wife intermittently pushing pt in transport chair as needed ADLs Comments: Per entry 11/02/22: Wife assists with managing shoes and intermittently assists with donning pants and depends depending on how well his L UE is moving (hx of injury since childhood limiting ROM).        OT Problem List: Decreased strength;Decreased activity tolerance;Impaired balance (sitting and/or standing);Decreased cognition;Decreased safety awareness;Decreased knowledge of use of DME or AE;Decreased knowledge of precautions;Pain      OT Treatment/Interventions: Self-care/ADL training;Therapeutic exercise;DME and/or AE instruction;Therapeutic activities;Patient/family education;Balance training;Cognitive remediation/compensation    OT Goals(Current goals can be found in the care plan section) Acute Rehab OT Goals Patient Stated Goal: less pain OT Goal Formulation: With patient Time For Goal Achievement: 02/15/23 Potential to Achieve Goals: Fair ADL Goals Pt Will Perform Grooming: with min guard assist;standing Pt Will  Transfer to Toilet: with mod assist;bedside commode;stand pivot transfer Additional ADL Goal #1: Pt will complete bed mobility with min assist as precursor to ADLs. Additional ADL Goal #2: Pt will recall and adhere to spinal precautions with supervision during ADLs.  OT Frequency: Min 2X/week    Co-evaluation PT/OT/SLP Co-Evaluation/Treatment: Yes Reason for Co-Treatment: Necessary to address cognition/behavior during functional activity;For patient/therapist safety;To address functional/ADL transfers PT goals addressed during session: Mobility/safety with mobility;Balance;Proper use of DME OT goals addressed during session: ADL's and self-care      AM-PAC OT "6 Clicks" Daily Activity     Outcome Measure Help from another person eating meals?: A Little Help from another person taking care of personal grooming?: A Little Help from another person toileting, which includes using toliet, bedpan, or urinal?: A Lot Help from another person bathing (including washing, rinsing, drying)?: A Lot Help from another person to put on and taking off regular upper body clothing?: A Lot Help from another person to put on and taking off regular lower body clothing?: Total 6 Click Score: 13   End of Session Equipment Utilized During Treatment: Gait belt;Rolling walker (2 wheels);Back brace Nurse Communication: Mobility status  Activity Tolerance: Patient limited by pain Patient left: in bed;with call bell/phone within reach;with bed alarm set  OT Visit Diagnosis: Other abnormalities of gait and mobility (R26.89);Muscle weakness (generalized) (M62.81);Pain;Other symptoms and signs involving cognitive function Pain - part of body:  (back)  Time: 1610-9604 OT Time Calculation (min): 34 min Charges:  OT General Charges $OT Visit: 1 Visit OT Evaluation $OT Eval Moderate Complexity: 1 Mod  ,Barry Brunner, OT Acute Rehabilitation Services Office [phone]   Chancy Milroy 02/01/2023,  2:05 PM

## 2023-02-01 NOTE — [place] FL2 (Signed)
[place] MEDICAID FL2 LEVEL OF CARE FORM     IDENTIFICATION  Patient Name: Aaron Schmidt Birthdate: 08-Feb-1933 Sex: male Admission Date (Current Location): 01/31/2023  [facility] and [place] Number:  Producer, television/film/video and Address:  [facility], [address], [place], [place] [address]      Provider Number: [id number]  Attending Physician Name and Address:  Dawley, Alan Mulder, DO  Relative Name and Phone Number:       Current Level of Care: Hospital Recommended Level of Care: Skilled Nursing Facility Prior Approval Number:    Date Approved/Denied:   PASRR Number:    Discharge Plan: SNF    Current Diagnoses: Patient Active Problem List   Diagnosis Date Noted   Pseudarthrosis after fusion or arthrodesis 01/31/2023   Thoracic myelopathy 11/01/2022   Left kidney mass 10/30/2022   History of MI (myocardial infarction) 10/30/2022   Elevated fasting glucose 10/30/2022   Abnormal laboratory test 10/30/2022   Bilateral leg edema 09/26/2022   Mitral regurgitation 01/12/2021   Anemia due to chronic blood loss 01/10/2021   Benign prostatic hyperplasia 01/10/2021   Benign prostatic hyperplasia with lower urinary tract symptoms 01/10/2021   Chronic kidney disease, stage 3 unspecified (HCC) 01/10/2021   Peripheral edema 01/10/2021   Enlarged prostate 01/10/2021   Hardening of the aorta (main artery of the heart) (HCC) 01/10/2021   Hypoalbuminemia 01/10/2021   Impaired fasting glucose 01/10/2021   Iron deficiency anemia 01/10/2021   Kidney stone 01/10/2021   Mitral valve disorder 01/10/2021   Osteopenia 01/10/2021   Other specified disorders of kidney and ureter 01/10/2021   Peripheral neuralgia 01/10/2021   Sciatica 01/10/2021   Personal history of colonic polyps 01/10/2021   Personal history of other diseases of the respiratory system 01/10/2021   Pure hypercholesterolemia 01/10/2021   Thrombocytopenia (HCC) 01/10/2021   Tubular adenoma of colon  01/10/2021   Unspecified abnormal finding in specimens from other organs, systems and tissues 01/10/2021   Edema 01/10/2021   Old myocardial infarction 01/10/2021   Osteoarthrosis 01/10/2021   Intermittent tremor 01/10/2021   Arthritis    History of kidney stones    Hypertension    Parkinson's disease    Occasional tremors 07/26/2020   Chronic bilateral low back pain 07/26/2020   Sensorineural hearing loss (SNHL) of both ears 06/15/2020   Muscle pain 03/01/2020   Degenerative lumbar spinal stenosis 09/22/2019   Body mass index (BMI) 25.0-25.9, adult 08/31/2019   Scoliosis concern 08/31/2019   Lumbar stenosis with neurogenic claudication 08/31/2019   Spinal stenosis, lumbar region without neurogenic claudication 08/19/2019   Hypotension 06/23/2019   Myocarditis (HCC) 05/19/2019   CAD (coronary artery disease) 04/20/2019   Obesity 04/06/2019   Malnutrition of moderate degree 03/23/2019   NSTEMI (non-ST elevated myocardial infarction) (HCC) 03/21/2019   Myocardial infarction (HCC) 03/21/2019   Anemia 09/21/2017   Symptomatic anemia 09/20/2017   Protein-calorie malnutrition, severe 09/11/2017   Empyema lung (HCC) 09/09/2017   SIRS (systemic inflammatory response syndrome) (HCC) 09/05/2017   Elevated troponin 09/05/2017   Normochromic normocytic anemia 09/05/2017   Hyponatremia 09/05/2017   Acute kidney injury superimposed on chronic kidney disease (HCC) 09/05/2017   Chronic pain 09/05/2017   Pleural effusion, left 09/04/2017   Pleural effusion on left 08/19/2017   Sinusitis, chronic 08/19/2017   Fall in home 07/10/2017   Murmur 01/08/2017   Skin ulcer of toe of left foot, limited to breakdown of skin (HCC) 09/04/2016   Displacement of cervical intervertebral  disc 07/19/2014   Cervical myelopathy with cervical radiculopathy (HCC) 06/24/2014   LBBB (left bundle branch block) 06/21/2014   Hyperlipidemia 06/21/2014   Preoperative cardiovascular examination 06/21/2014    Degenerative cervical spinal stenosis 05/26/2014   Cervical spondylosis without myelopathy 05/12/2014   Neck pain 03/03/2014   Shoulder joint pain 12/01/2013   Spinal stenosis of lumbar region 08/10/2013   Chronic low back pain 07/29/2013   Lumbar radiculopathy 07/29/2013   Lumbar post-laminectomy syndrome 07/15/2013   Obstructive sleep apnea 09/30/2010   Sleep apnea 2012   Essential hypertension 08/29/2010    Orientation RESPIRATION BLADDER Height & Weight     Self, Time, Situation, Place  Normal Incontinent Weight: 160 lb (72.6 kg) Height:  5\' 10"  (177.8 cm)  BEHAVIORAL SYMPTOMS/MOOD NEUROLOGICAL BOWEL NUTRITION STATUS      Continent Diet (see DC summary)  AMBULATORY STATUS COMMUNICATION OF NEEDS Skin   Extensive Assist Verbally Surgical wounds (closed back incision, honeycomb dressing)                       Personal Care Assistance Level of Assistance  Bathing, Feeding, Dressing Bathing Assistance: Maximum assistance Feeding assistance: Limited assistance Dressing Assistance: Maximum assistance     Functional Limitations Info             SPECIAL CARE FACTORS FREQUENCY  PT (By licensed PT), OT (By licensed OT)     PT Frequency: 5x/wk OT Frequency: 5x/wk            Contractures Contractures Info: Not present    Additional Factors Info  Code Status, Allergies Code Status Info: Full Allergies Info: Baclofen, Diazepam, Doxazosin Mesylate           Current Medications (02/01/2023):  This is the current hospital active medication list Current Facility-Administered Medications  Medication Dose Route Frequency Provider Last Rate Last Admin   0.9 %  sodium chloride infusion  250 mL Intravenous Continuous Patrici Ranks [place], PA-C       0.9 %  sodium chloride infusion   Intravenous Continuous Patrici Ranks [place], PA-C       acetaminophen (TYLENOL) tablet 650 mg  650 mg Oral Q4H PRN Patrici Ranks [place], PA-C   650 mg at 02/01/23 1329   Or    acetaminophen (TYLENOL) suppository 650 mg  650 mg Rectal Q4H PRN Patrici Ranks [place], PA-C       [START ON 02/07/2023] aspirin EC tablet 81 mg  81 mg Oral Daily Patrici Ranks [place], PA-C       carbidopa-levodopa (SINEMET IR) 25-100 MG per tablet immediate release 2 tablet  2 tablet Oral QID Patrici Ranks [place], PA-C   2 tablet at 02/01/23 1059   Carbidopa-Levodopa ER (SINEMET CR) 25-100 MG tablet controlled release 1 tablet  1 tablet Oral QHS Patrici Ranks [place], PA-C   1 tablet at 01/31/23 2206   cyanocobalamin (VITAMIN B12) tablet 1,000 mcg  1,000 mcg Oral Daily Patrici Ranks [place], PA-C   1,000 mcg at 02/01/23 1059   docusate sodium (COLACE) capsule 100 mg  100 mg Oral q AM [place], PA-C   100 mg at 02/01/23 1610   finasteride (PROSCAR) tablet 5 mg  5 mg Oral Daily Patrici Ranks [place], PA-C   5 mg at 02/01/23 1100   furosemide (LASIX) tablet 20 mg  20 mg Oral Q M,W,F Patrici Ranks [place], PA-C   20 mg at 02/01/23 1059   HYDROmorphone (DILAUDID) injection 0.5 mg  0.5 mg Intravenous Q3H  PRN Patrici Ranks [place], PA-C   0.5 mg at 02/01/23 1001   HYDROmorphone (DILAUDID) tablet 2 mg  2 mg Oral Q5H PRN Patrici Ranks [place], PA-C       losartan (COZAAR) tablet 25 mg  25 mg Oral Daily Patrici Ranks [place], [place]   25 mg at 02/01/23 1059   magnesium hydroxide (MILK OF MAGNESIA) suspension 5 mL  5 mL Oral Daily PRN Patrici Ranks [place], PA-C       menthol-cetylpyridinium (CEPACOL) lozenge 3 mg  1 lozenge Oral PRN Patrici Ranks [place], PA-C       Or   phenol (CHLORASEPTIC) mouth spray 1 spray  1 spray Mouth/Throat PRN Patrici Ranks [place], PA-C       methocarbamol (ROBAXIN) tablet 500 mg  500 mg Oral Q6H PRN Patrici Ranks [place], PA-C   500 mg at 02/01/23 7829   Or   methocarbamol (ROBAXIN) 500 mg in dextrose 5 % 50 mL IVPB  500 mg Intravenous Q6H PRN Patrici Ranks [place], PA-C       mineral oil liquid 90 mL  90 mL Oral Q1500 Patrici Ranks [place], PA-C    30 mL at 02/01/23 1321   nortriptyline (PAMELOR) capsule 25 mg  25 mg Oral QHS Patrici Ranks [place], PA-C   25 mg at 01/31/23 2206   ondansetron (ZOFRAN) tablet 4 mg  4 mg Oral Q6H PRN Patrici Ranks [place], PA-C       Or   ondansetron [facility]) injection 4 mg  4 mg Intravenous Q6H PRN Patrici Ranks [place], PA-C       oxyCODONE (Oxy IR/ROXICODONE) immediate release tablet 10 mg  10 mg Oral Q4H PRN Patrici Ranks [place], PA-C   10 mg at 02/01/23 1328   oxyCODONE (Oxy IR/ROXICODONE) immediate release tablet 5 mg  5 mg Oral Q4H PRN Patrici Ranks [place], PA-C       polyvinyl alcohol (LIQUIFILM TEARS) 1.4 % ophthalmic solution 1 drop  1 drop Both Eyes PRN Patrici Ranks [place], PA-C   1 drop at 02/01/23 1320   pregabalin (LYRICA) capsule 150 mg  150 mg Oral Daily Patrici Ranks [place], PA-C   150 mg at 02/01/23 1059   senna (SENOKOT) tablet 25.8 mg  3 tablet Oral q AM [place] Riley, PA-C   25.8 mg at 02/01/23 5621   simvastatin (ZOCOR) tablet 40 mg  40 mg Oral QHS Patrici Ranks [place], PA-C   40 mg at 01/31/23 2206   sodium chloride flush (NS) 0.9 % injection 3 mL  3 mL Intravenous Q12H Patrici Ranks [place], PA-C   3 mL at 02/01/23 1101   sodium chloride flush (NS) 0.9 % injection 3 mL  3 mL Intravenous PRN Patrici Ranks [place], PA-C       tamsulosin [facility]) capsule 0.8 mg  0.8 mg Oral Daily Patrici Ranks [place], PA-C   0.8 mg at 02/01/23 1059   traZODone (DESYREL) tablet 150 mg  150 mg Oral QHS Patrici Ranks [place], PA-C   150 mg at 01/31/23 2205     Discharge Medications: Please see discharge summary for a list of discharge medications.  Relevant Imaging Results:  Relevant Lab Results:   Additional Information SS#: [id number]  Baldemar Lenis, LCSW

## 2023-02-01 NOTE — Progress Notes (Addendum)
Called Amy of Neurosurgery office to relay the blood pressure reading at 1535 and 1915, per Amy will let Dr. Conchita Paris know.  At 1949, per Dr. Conchita Paris, continue monitoring patient if will have any dizziness.

## 2023-02-01 NOTE — Progress Notes (Signed)
S: No issues overnight.   O: EXAM:  BP 129/62 (BP Location: Left Arm)   Pulse 91   Temp 99.3 F (37.4 C) (Oral)   Resp 18   Ht 5\' 10"  (1.778 m)   Wt 72.6 kg   SpO2 97%   BMI 22.96 kg/m   Awake, alert, oriented  Speech fluent, appropriate  5/5 BUE/BLE  SILTx4 Incision c/d/i  ASSESSMENT:  89 y.o. male s/p extension of fusion T9-L1  PLAN: - Continue supportive care -PT/OT as tolerated. TLSO at all times when OOB.  -Call w/ questions/concerns.     Patrici Ranks, [facility]

## 2023-02-01 NOTE — TOC Initial Note (Signed)
Transition of Care [facility]) - Initial/Assessment Note    Patient Details  Name: Aaron Schmidt MRN: [id number] Date of Birth: [birth year]  [facility]) CM/SW Contact:    Baldemar Lenis, LCSW Phone Number: 02/01/2023, 3:32 PM  Clinical Narrative:   Patient from [facility] IDL, plan is for SNF upon return. Bed is available for patient when he is ready, will be going to room 110. CSW requested initiation of insurance authorization. CSW to follow.                Expected Discharge Plan: Skilled Nursing Facility Barriers to Discharge: Insurance Authorization, Continued Medical Work up   Patient Goals and CMS Choice Patient states their goals for this hospitalization and ongoing recovery are:: to return to his life CMS Medicare.gov Compare Post Acute Care list provided to:: Patient Choice offered to / list presented to : Patient Vel[place] ownership interest in [facility].provided to:: Patient    Expected Discharge Plan and Services     Post Acute Care Choice: Skilled Nursing Facility Living arrangements for the past 2 months: Independent Living Facility                                      Prior Living Arrangements/Services Living arrangements for the past 2 months: Independent Living Facility Lives with:: Spouse Patient language and need for interpreter reviewed:: No Do you feel safe going back to the place where you live?: Yes      Need for Family Participation in Patient Care: No (Comment) Care giver support system in place?: Yes (comment)   Criminal Activity/Legal Involvement Pertinent to Current Situation/Hospitalization: No - Comment as needed  Activities of Daily Living Home Assistive Devices/Equipment: Walker (specify type) ADL Screening (condition at time of admission) Patient's cognitive ability adequate to safely complete daily activities?: Yes Is the patient deaf or have difficulty hearing?: Yes (bilat hearing aids) Does the patient  have difficulty seeing, even when wearing glasses/contacts?: No Does the patient have difficulty concentrating, remembering, or making decisions?: Yes Patient able to express need for assistance with ADLs?: No Does the patient have difficulty dressing or bathing?: Yes Independently performs ADLs?: No Communication: Independent Dressing (OT): Needs assistance Is this a change from baseline?: Pre-admission baseline Feeding: Independent Bathing: Needs assistance Is this a change from baseline?: Pre-admission baseline In/Out Bed: Needs assistance Is this a change from baseline?: Pre-admission baseline Walks in Home: Needs assistance Is this a change from baseline?: Pre-admission baseline Does the patient have difficulty walking or climbing stairs?: Yes Weakness of Legs: Both Weakness of Arms/Hands: None  Permission Sought/Granted Permission sought to share information with : Facility Medical sales representative, Family Supports Permission granted to share information with : Yes, Verbal Permission Granted  Share Information with NAME: Venita Sheffield  Permission granted to share info w AGENCY: [facility]  Permission granted to share info w Relationship: Spouse     Emotional Assessment Appearance:: Appears stated age Attitude/Demeanor/Rapport: Engaged Affect (typically observed): Appropriate Orientation: : Oriented to Self, Oriented to Place, Oriented to  Time, Oriented to Situation Alcohol / Substance Use: Not Applicable Psych Involvement: No (comment)  Admission diagnosis:  Pseudarthrosis after fusion or arthrodesis [M96.0] Patient Active Problem List   Diagnosis Date Noted   Pseudarthrosis after fusion or arthrodesis 01/31/2023   Thoracic myelopathy 11/01/2022   Left kidney mass 10/30/2022   History of MI (myocardial infarction) 10/30/2022   Elevated fasting glucose  10/30/2022   Abnormal laboratory test 10/30/2022   Bilateral leg edema 09/26/2022   Mitral regurgitation 01/12/2021   Anemia  due to chronic blood loss 01/10/2021   Benign prostatic hyperplasia 01/10/2021   Benign prostatic hyperplasia with lower urinary tract symptoms 01/10/2021   Chronic kidney disease, stage 3 unspecified (HCC) 01/10/2021   Peripheral edema 01/10/2021   Enlarged prostate 01/10/2021   Hardening of the aorta (main artery of the heart) (HCC) 01/10/2021   Hypoalbuminemia 01/10/2021   Impaired fasting glucose 01/10/2021   Iron deficiency anemia 01/10/2021   Kidney stone 01/10/2021   Mitral valve disorder 01/10/2021   Osteopenia 01/10/2021   Other specified disorders of kidney and ureter 01/10/2021   Peripheral neuralgia 01/10/2021   Sciatica 01/10/2021   Personal history of colonic polyps 01/10/2021   Personal history of other diseases of the respiratory system 01/10/2021   Pure hypercholesterolemia 01/10/2021   Thrombocytopenia (HCC) 01/10/2021   Tubular adenoma of colon 01/10/2021   Unspecified abnormal finding in specimens from other organs, systems and tissues 01/10/2021   Edema 01/10/2021   Old myocardial infarction 01/10/2021   Osteoarthrosis 01/10/2021   Intermittent tremor 01/10/2021   Arthritis    History of kidney stones    Hypertension    Parkinson's disease    Occasional tremors 07/26/2020   Chronic bilateral low back pain 07/26/2020   Sensorineural hearing loss (SNHL) of both ears 06/15/2020   Muscle pain 03/01/2020   Degenerative lumbar spinal stenosis 09/22/2019   Body mass index (BMI) 25.0-25.9, adult 08/31/2019   Scoliosis concern 08/31/2019   Lumbar stenosis with neurogenic claudication 08/31/2019   Spinal stenosis, lumbar region without neurogenic claudication 08/19/2019   Hypotension 06/23/2019   Myocarditis (HCC) 05/19/2019   CAD (coronary artery disease) 04/20/2019   Obesity 04/06/2019   Malnutrition of moderate degree 03/23/2019   NSTEMI (non-ST elevated myocardial infarction) (HCC) 03/21/2019   Myocardial infarction (HCC) 03/21/2019   Anemia 09/21/2017    Symptomatic anemia 09/20/2017   Protein-calorie malnutrition, severe 09/11/2017   Empyema lung (HCC) 09/09/2017   SIRS (systemic inflammatory response syndrome) (HCC) 09/05/2017   Elevated troponin 09/05/2017   Normochromic normocytic anemia 09/05/2017   Hyponatremia 09/05/2017   Acute kidney injury superimposed on chronic kidney disease (HCC) 09/05/2017   Chronic pain 09/05/2017   Pleural effusion, left 09/04/2017   Pleural effusion on left 08/19/2017   Sinusitis, chronic 08/19/2017   Fall in home 07/10/2017   Murmur 01/08/2017   Skin ulcer of toe of left foot, limited to breakdown of skin (HCC) 09/04/2016   Displacement of cervical intervertebral disc 07/19/2014   Cervical myelopathy with cervical radiculopathy (HCC) 06/24/2014   LBBB (left bundle branch block) 06/21/2014   Hyperlipidemia 06/21/2014   Preoperative cardiovascular examination 06/21/2014   Degenerative cervical spinal stenosis 05/26/2014   Cervical spondylosis without myelopathy 05/12/2014   Neck pain 03/03/2014   Shoulder joint pain 12/01/2013   Spinal stenosis of lumbar region 08/10/2013   Chronic low back pain 07/29/2013   Lumbar radiculopathy 07/29/2013   Lumbar post-laminectomy syndrome 07/15/2013   Obstructive sleep apnea 09/30/2010   Sleep apnea 2012   Essential hypertension 08/29/2010   PCP:  Nadara Eaton, MD Pharmacy:   [facility] - [place], [place] - [address] [place] [address] Phone: [phone] Fax: [phone]  [facility] [address] [place], [place] - [address] [place] [address] Phone: [phone] Fax: [phone]  [facility] -  [place], [place] - [address] [place] [address] Phone: [phone] Fax: [phone]     Social Determinants of Health (SDOH) Social History: SDOH Screenings   Food Insecurity: No Food Insecurity (01/31/2023)  Housing:  Low Risk  (01/31/2023)  Transportation Needs: No Transportation Needs (01/31/2023)  Utilities: Not At Risk (01/31/2023)  Tobacco Use: Medium Risk (01/31/2023)   SDOH Interventions:     Readmission Risk Interventions     No data to display

## 2023-02-01 NOTE — Progress Notes (Addendum)
Amy of Neurosurgery office called to relay message to Dr. Conchita Paris about latest BP of 94/42 with MAP of 58.

## 2023-02-02 MED ORDER — BISACODYL 10 MG RE SUPP
10.0000 mg | Freq: Once | RECTAL | Status: AC
  Administered 2023-02-02: 10 mg via RECTAL
  Filled 2023-02-02: qty 1

## 2023-02-02 NOTE — Plan of Care (Signed)
  Problem: Safety: Goal: Ability to remain free from injury will improve Outcome: Not Progressing   Problem: Pain Managment: Goal: General experience of comfort will improve Outcome: Not Progressing   

## 2023-02-02 NOTE — Progress Notes (Signed)
No new issues or problems overnight.  Patient continues to complain of right groin and hip pain worsened with movement.  No new weakness.  Wound clean and dry.  Chest and abdomen benign.  He is afebrile.  His vitals are otherwise stable.  Progressing slowly following extension of his thoracic fusion.  Continue efforts at mobilization.  If right groin and hip pain persist patient may need follow-up MRI scan to better evaluate.

## 2023-02-03 NOTE — Progress Notes (Signed)
[employer] requesting updated MD notes. Progress notes faxed [phone], ref [id number]. Auth for [facility] remains pending at this time.   Dellie Burns, MSW, LCSW [phone] (coverage)

## 2023-02-03 NOTE — Progress Notes (Signed)
Closed system drain removed pt tolerated it well.

## 2023-02-03 NOTE — Plan of Care (Signed)
  Problem: Bowel/Gastric: Goal: Gastrointestinal status for postoperative course will improve Outcome: Not Progressing   Problem: Pain Management: Goal: Pain level will decrease Outcome: Not Progressing

## 2023-02-03 NOTE — Progress Notes (Addendum)
  NEUROSURGERY PROGRESS NOTE   No issues overnight. Pt cont to c/o back pain, right sided lateral hip/groin pain. Reports right leg muscle spasms.  EXAM:  BP (!) 116/59 (BP Location: Right Arm)   Pulse 80   Temp 98.2 F (36.8 C) (Oral)   Resp 18   Ht 5\' 10"  (1.778 m)   Wt 72.6 kg   SpO2 98%   BMI 22.96 kg/m   Awake, alert, oriented  Speech fluent, appropriate  CN grossly intact  Good strength BLE Wound c/d/I Hemovac in place,38cc  IMPRESSION:  89 y.o. male POD#3 Extension of fusion for hardware failure. Cont to report right flank groin pain  PLAN: - Cont PT/OT - d/c Hemovac today - May consider repeat imaging if no improvement in pain   Lisbeth Renshaw, MD [facility]

## 2023-02-04 MED ORDER — SODIUM CHLORIDE 0.9 % IV SOLN: INTRAVENOUS | Status: DC

## 2023-02-04 NOTE — Progress Notes (Signed)
Occupational Therapy Treatment Patient Details Name: Aaron Schmidt MRN: [id number] DOB: 22-Dec-1932 Today's Date: 02/04/2023   History of present illness Pt is an 89 y.o. male who presented 5/10 for open thoracic extension fusion of T9-11, removal of T12 screws. CT with T12 hardware loosening and L T12 fracture. PMHx: HTN, CAD, MI, OA, anemia, CKD stage 3, HLD, OSA, Parkinson's disease, peripheral neuralgia.   OT comments  Patient progressing towards OT goals slowly.  He was premedicated, requiring less assist for bed mobility and transfers today.  Able to sustain sitting balance at EOB with min guard to close supervision, required min assist for UB aDLs and total assist for LB ADLs, total assist for TLSO mgmt.  BP soft but stable, and pt denies symptoms.  Utilized stedy to transfer to [employer], stood from Medical illustrator with RW.  Continue to recommend <3hrs/day at inpatient setting after dc.    Recommendations for follow up therapy are one component of a multi-disciplinary discharge planning process, led by the attending physician.  Recommendations may be updated based on patient status, additional functional criteria and insurance authorization.    Assistance Recommended at Discharge Frequent or constant Supervision/Assistance  Patient can return home with the following  Help with stairs or ramp for entrance;Assist for transportation;Direct supervision/assist for financial management;Direct supervision/assist for medications management;Assistance with feeding;Assistance with cooking/housework;A lot of help with bathing/dressing/bathroom;Two people to help with walking and/or transfers   Equipment Recommendations  Other (comment) (defer)    Recommendations for Other Services      Precautions / Restrictions Precautions Precautions: Fall;Back Precaution Booklet Issued: No Precaution Comments: reviewed and provided cues to maintain precautions during session Required Braces or Orthoses: Spinal  Brace Spinal Brace: Thoracolumbosacral orthotic;Applied in sitting position (clamshell) Restrictions Weight Bearing Restrictions: No       Mobility Bed Mobility Overal bed mobility: Needs Assistance Bed Mobility: Rolling, Sidelying to Sit, Sit to Sidelying Rolling: Mod assist Sidelying to sit: Mod assist, +2 for physical assistance, +2 for safety/equipment, HOB elevated       General bed mobility comments: Cues for log roll technique, use of bed pad to facilitate bringing hips into sidelying position, use of rail, assist to guide BLE's off edge of bed and bring trunk to upright    Transfers Overall transfer level: Needs assistance Equipment used: Rolling walker (2 wheels), Ambulation equipment used Transfers: Sit to/from Stand Sit to Stand: Mod assist, +2 physical assistance, From elevated surface, +2 safety/equipment, Min assist           General transfer comment: MinA + 2 with use of Stedy, modA +2 with use of RW. Cues for hand and feet placement, with a wider BOS and anterior weight shift. Able to weight shift R/L with assist for balance/stability     Balance Overall balance assessment: Needs assistance Sitting-balance support: Bilateral upper extremity supported, Feet supported Sitting balance-Leahy Scale: Fair     Standing balance support: Bilateral upper extremity supported, During functional activity, Reliant on assistive device for balance Standing balance-Leahy Scale: Poor Standing balance comment: relies on BUE and external support                           ADL either performed or assessed with clinical judgement   ADL Overall ADL's : Needs assistance/impaired     Grooming: Set up;Oral care;Sitting           Upper Body Dressing : Minimal assistance;Sitting Upper Body Dressing Details (indicate cue type and  reason): total for brace but able to simulate donning gown with min guard Lower Body Dressing: Total assistance;+2 for  safety/equipment;+2 for physical assistance;Sit to/from stand   Toilet Transfer: Total assistance;+2 for physical assistance;+2 for safety/equipment Toilet Transfer Details (indicate cue type and reason): using stedy to recliner Toileting- Clothing Manipulation and Hygiene: Total assistance;+2 for safety/equipment;+2 for physical assistance;Sit to/from stand       Functional mobility during ADLs: Moderate assistance;Minimal assistance;+2 for safety/equipment;+2 for physical assistance;Rolling walker (2 wheels)      Extremity/Trunk Assessment              Vision       Perception     Praxis      Cognition Arousal/Alertness: Awake/alert Behavior During Therapy: Anxious Overall Cognitive Status: Impaired/Different from baseline Area of Impairment: Memory, Safety/judgement, Attention, Following commands, Awareness, Problem solving                   Current Attention Level: Sustained Memory: Decreased short-term memory, Decreased recall of precautions Following Commands: Follows one step commands consistently, Follows one step commands with increased time, Follows multi-step commands inconsistently Safety/Judgement: Decreased awareness of safety, Decreased awareness of deficits Awareness: Emergent Problem Solving: Slow processing, Decreased initiation, Difficulty sequencing, Requires verbal cues, Requires tactile cues General Comments: Improved cognition, pt now oriented to place and month. When asked date, pt stating, "I'll have to count down from Thursday to figure it out." Preference for step by step instruction with mobility. Decreased STM, pt reporting walking hundreds of feet a couple days ago        Exercises      Shoulder Instructions       General Comments      Pertinent Vitals/ Pain       Pain Assessment Pain Assessment: Faces Faces Pain Scale: Hurts little more Pain Location: back Pain Descriptors / Indicators: Discomfort, Grimacing, Guarding,  Sore Pain Intervention(s): Limited activity within patient's tolerance, Monitored during session, Premedicated before session, Repositioned  Home Living                                          Prior Functioning/Environment              Frequency  Min 2X/week        Progress Toward Goals  OT Goals(current goals can now be found in the care plan section)  Progress towards OT goals: Progressing toward goals  Acute Rehab OT Goals Patient Stated Goal: less pain OT Goal Formulation: With patient Time For Goal Achievement: 02/15/23 Potential to Achieve Goals: Fair  Plan Discharge plan remains appropriate;Frequency remains appropriate    Co-evaluation    PT/OT/SLP Co-Evaluation/Treatment: Yes Reason for Co-Treatment: Necessary to address cognition/behavior during functional activity;For patient/therapist safety;To address functional/ADL transfers PT goals addressed during session: Mobility/safety with mobility;Balance;Proper use of DME OT goals addressed during session: ADL's and self-care      AM-PAC OT "6 Clicks" Daily Activity     Outcome Measure   Help from another person eating meals?: A Little Help from another person taking care of personal grooming?: A Little Help from another person toileting, which includes using toliet, bedpan, or urinal?: A Lot Help from another person bathing (including washing, rinsing, drying)?: A Lot Help from another person to put on and taking off regular upper body clothing?: A Lot Help from another person to put on and taking off  regular lower body clothing?: Total 6 Click Score: 13    End of Session Equipment Utilized During Treatment: Gait belt;Rolling walker (2 wheels);Back brace  OT Visit Diagnosis: Other abnormalities of gait and mobility (R26.89);Muscle weakness (generalized) (M62.81);Pain;Other symptoms and signs involving cognitive function Pain - part of body:  (back)   Activity Tolerance Patient  tolerated treatment well   Patient Left in chair;with call bell/phone within reach;with chair alarm set   Nurse Communication Mobility status        Time: 1610-9604 OT Time Calculation (min): 37 min  Charges: OT General Charges $OT Visit: 1 Visit OT Treatments $Self Care/Home Management : 8-22 mins  Barry Brunner, OT Acute Rehabilitation Services Office [phone]   Chancy Milroy 02/04/2023, 10:37 AM

## 2023-02-04 NOTE — Progress Notes (Signed)
Physical Therapy Treatment Patient Details Name: Aaron Schmidt MRN: [id number] DOB: 1933-07-30 Today's Date: 02/04/2023   History of Present Illness Pt is an 89 y.o. male who presented 5/10 for open thoracic extension fusion of T9-11, removal of T12 screws. CT with T12 hardware loosening and L T12 fracture. PMHx: HTN, CAD, MI, OA, anemia, CKD stage 3, HLD, OSA, Parkinson's disease, peripheral neuralgia.    PT Comments    Pt progressing towards his physical therapy goals. Premedicated prior to session and overall seemingly controlled throughout session and TLSO donned in sitting position. BP supine 88/50 (64), sitting 86/51 (63); pt denies dizziness/lightheadedness. Utilized Stedy to progress from bed to chair and then trialed use of RW to stand from chair and work on pre gait training. Patient will benefit from continued inpatient follow up therapy, <3 hours/day.    Recommendations for follow up therapy are one component of a multi-disciplinary discharge planning process, led by the attending physician.  Recommendations may be updated based on patient status, additional functional criteria and insurance authorization.  Follow Up Recommendations  Can patient physically be transported by private vehicle: No    Assistance Recommended at Discharge Frequent or constant Supervision/Assistance  Patient can return home with the following Two people to help with walking and/or transfers;A lot of help with bathing/dressing/bathroom;Assistance with cooking/housework;Direct supervision/assist for medications management;Direct supervision/assist for financial management;Assist for transportation;Help with stairs or ramp for entrance   Equipment Recommendations  Other (comment) (defer to next venue of care)    Recommendations for Other Services       Precautions / Restrictions Precautions Precautions: Fall;Back Precaution Booklet Issued: No Precaution Comments: hemovac; reviewed and provided cues  to maintain precautions during session Required Braces or Orthoses: Spinal Brace Spinal Brace: Thoracolumbosacral orthotic;Applied in sitting position (clamshell) Restrictions Weight Bearing Restrictions: No     Mobility  Bed Mobility Overal bed mobility: Needs Assistance Bed Mobility: Rolling, Sidelying to Sit, Sit to Sidelying Rolling: Mod assist Sidelying to sit: Mod assist, +2 for physical assistance, +2 for safety/equipment, HOB elevated       General bed mobility comments: Cues for log roll technique, use of bed pad to facilitate bringing hips into sidelying position, use of rail, assist to guide BLE's off edge of bed and bring trunk to upright    Transfers Overall transfer level: Needs assistance Equipment used: Rolling walker (2 wheels), Ambulation equipment used Transfers: Sit to/from Stand Sit to Stand: Mod assist, +2 physical assistance, From elevated surface, +2 safety/equipment, Min assist           General transfer comment: MinA + 2 with use of Stedy, modA +2 with use of RW. Cues for hand and feet placement, with a wider BOS and anterior weight shift. Able to weight shift R/L with assist for balance/stability    Ambulation/Gait                   Stairs             Wheelchair Mobility    Modified Rankin (Stroke Patients Only)       Balance Overall balance assessment: Needs assistance Sitting-balance support: Bilateral upper extremity supported, Feet supported Sitting balance-Leahy Scale: Fair     Standing balance support: Bilateral upper extremity supported, During functional activity, Reliant on assistive device for balance Standing balance-Leahy Scale: Poor  Cognition Arousal/Alertness: Awake/alert Behavior During Therapy: Anxious Overall Cognitive Status: Impaired/Different from baseline Area of Impairment: Memory, Safety/judgement, Attention, Following commands, Awareness, Problem  solving                   Current Attention Level: Sustained Memory: Decreased short-term memory, Decreased recall of precautions Following Commands: Follows one step commands consistently, Follows one step commands with increased time, Follows multi-step commands inconsistently Safety/Judgement: Decreased awareness of safety, Decreased awareness of deficits Awareness: Emergent Problem Solving: Slow processing, Decreased initiation, Difficulty sequencing, Requires verbal cues, Requires tactile cues General Comments: Improved cognition, pt now oriented to place and month. When asked date, pt stating, "I'll have to count down from Thursday to figure it out." Preference for step by step instruction with mobility. Decreased STM, pt reporting walking hundreds of feet a couple days ago        Exercises General Exercises - Lower Extremity Long Arc Quad: Both, 5 reps, Seated    General Comments        Pertinent Vitals/Pain Pain Assessment Pain Assessment: Faces Faces Pain Scale: Hurts little more Pain Location: back Pain Descriptors / Indicators: Discomfort, Grimacing, Guarding, Sore Pain Intervention(s): Limited activity within patient's tolerance, Monitored during session, Premedicated before session    Home Living                          Prior Function            PT Goals (current goals can now be found in the care plan section) Acute Rehab PT Goals Patient Stated Goal: to return to his life PT Goal Formulation: With patient Time For Goal Achievement: 02/15/23 Potential to Achieve Goals: Fair Progress towards PT goals: Progressing toward goals    Frequency    Min 5X/week      PT Plan Current plan remains appropriate    Co-evaluation PT/OT/SLP Co-Evaluation/Treatment: Yes Reason for Co-Treatment: Necessary to address cognition/behavior during functional activity;For patient/therapist safety;To address functional/ADL transfers PT goals addressed  during session: Mobility/safety with mobility;Balance;Proper use of DME        AM-PAC PT "6 Clicks" Mobility   Outcome Measure  Help needed turning from your back to your side while in a flat bed without using bedrails?: A Lot Help needed moving from lying on your back to sitting on the side of a flat bed without using bedrails?: Total Help needed moving to and from a bed to a chair (including a wheelchair)?: Total Help needed standing up from a chair using your arms (e.g., wheelchair or bedside chair)?: Total Help needed to walk in hospital room?: Total Help needed climbing 3-5 steps with a railing? : Total 6 Click Score: 7    End of Session Equipment Utilized During Treatment: Gait belt;Back brace Activity Tolerance: Patient tolerated treatment well Patient left: with call bell/phone within reach;in chair;with chair alarm set Nurse Communication: Mobility status PT Visit Diagnosis: Unsteadiness on feet (R26.81);Other abnormalities of gait and mobility (R26.89);Muscle weakness (generalized) (M62.81);Difficulty in walking, not elsewhere classified (R26.2);Pain Pain - part of body:  (back)     Time: 4098-1191 PT Time Calculation (min) (ACUTE ONLY): 34 min  Charges:  $Therapeutic Activity: 8-22 mins                     Lillia Pauls, PT, DPT Acute Rehabilitation Services Office [phone]    Norval Morton 02/04/2023, 9:38 AM

## 2023-02-04 NOTE — TOC Progression Note (Signed)
Transition of Care [facility]) - Progression Note    Patient Details  Name: Aaron Schmidt MRN: [id number] Date of Birth: 06/09/1933  [facility]) CM/SW Contact  Baldemar Lenis, [place] Phone Number: 02/04/2023, 3:18 PM  Clinical Narrative:   CSW following for discharge to SNF. Authorization is still pending, CSW sent updated notes per [employer] request. [facility] is out of network with patient's insurance, but Francine Graven has approved a gap waiver due to patient living there for IDL. CSW to follow.    Expected Discharge Plan: Skilled Nursing Facility Barriers to Discharge: English as a second language teacher, Continued Medical Work up  Expected Discharge Plan and Services     Post Acute Care Choice: Skilled Nursing Facility Living arrangements for the past 2 months: Independent Living Facility                                       Social Determinants of Health (SDOH) Interventions SDOH Screenings   Food Insecurity: No Food Insecurity (01/31/2023)  Housing: Low Risk  (01/31/2023)  Transportation Needs: No Transportation Needs (01/31/2023)  Utilities: Not At Risk (01/31/2023)  Tobacco Use: Medium Risk (02/01/2023)    Readmission Risk Interventions     No data to display

## 2023-02-04 NOTE — Progress Notes (Signed)
   Providing Compassionate, Quality Care - Together  NEUROSURGERY PROGRESS NOTE   S: No issues overnight. Mild low BP without symptoms  O: EXAM:  BP (!) 76/45 (BP Location: Left Arm)   Pulse 79   Temp 98.4 F (36.9 C) (Oral)   Resp 16   Ht 5\' 10"  (1.778 m)   Wt 72.6 kg   SpO2 95%   BMI 22.96 kg/m   Awake, alert, oriented x3 PERRL Speech fluent, appropriate  CNs grossly intact  MAE well Incision c/d/I Foley in place  ASSESSMENT:  89 y.o. male with   T12 hardware loosening, s/p extension fusion  PLAN: - pt/ot - rehab planning - return to [facility] -cont TLSO when OOB -dc foley -flank pain seems improved/less bothersome    Thank you for allowing me to participate in this patient's care.  Please do not hesitate to call with questions or concerns.   Monia Pouch, DO Neurosurgeon [facility] Cell: [phone]

## 2023-02-04 NOTE — Care Management Important Message (Signed)
Important Message  Patient Details  Name: THEON DIOP MRN: [id number] Date of Birth: 06-Jan-1933   Medicare Important Message Given:  Yes     Hiep Ollis 02/04/2023, 3:12 PM

## 2023-02-05 MED ORDER — HEPARIN SODIUM (PORCINE) 5000 UNIT/ML IJ SOLN
5000.0000 [IU] | Freq: Three times a day (TID) | INTRAMUSCULAR | Status: DC
  Administered 2023-02-05 (×2): 5000 [IU] via SUBCUTANEOUS
  Filled 2023-02-05 (×2): qty 1

## 2023-02-05 MED ORDER — OXYCODONE HCL 10 MG PO TABS: 10.0000 mg | ORAL_TABLET | ORAL | 0 refills | Status: AC | PRN

## 2023-02-05 NOTE — Progress Notes (Signed)
Attempted to call report to [facility] multiple times without success. Bedside aware.

## 2023-02-05 NOTE — Progress Notes (Signed)
Troy Dawley, DO and Patrici Ranks, PA-C made aware of patient's BP 83/47. No new orders placed. Patient is asymptomatic. NAD. Patient sitting up in chair with spouse at the bedside.

## 2023-02-05 NOTE — Progress Notes (Signed)
Discharge instructions and medication prescription placed in packet for receiving facility.  Melony Overly, RN

## 2023-02-05 NOTE — Discharge Summary (Addendum)
Discharge Summary   Date of Admission: 01/31/23   Date of Discharge: 02/05/23   Attending Physician:  [facility] Course: Patient was admitted following an uncomplicated extension of Thoracolumbar fusion T9-L1. They were recovered in PACU and transferred to 3W. Patient will dc to SNF today. Pt to f/u in office in 2 weeks for post op visit.   Neurologic exam at discharge:  A&O x3 Strength 5/5 x4.  SILTx4.  Incision c/d/I.      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   aspirin EC 81 MG tablet Take 1 tablet (81 mg total) by mouth daily. Start taking on: Feb 07, 2023 What changed: These instructions start on Feb 07, 2023. If you are unsure what to do until then, ask your doctor or other care provider.   CAL-MAG-ZINC PO Take 1 tablet by mouth daily.   Carbidopa-Levodopa ER 25-100 MG tablet controlled release Commonly known as: SINEMET CR Take 1 tablet by mouth at bedtime. What changed: Another medication with the same name was changed. Make sure you understand how and when to take each.   carbidopa-levodopa 25-100 MG tablet Commonly known as: SINEMET IR 2 tablets at 8am/2 tablets at noon/2 at 4pm What changed:  how much to take how to take this when to take this additional instructions   cyanocobalamin 1000 MCG tablet Commonly known as: VITAMIN B12 Take 1,000 mcg by mouth in the morning.   docusate  sodium 100 MG capsule Commonly known as: COLACE Take 100 mg by mouth in the morning.   finasteride 5 MG tablet Commonly known as: PROSCAR Take 5 mg by mouth in the morning.   fluocinonide cream 0.05 % Commonly known as: LIDEX Apply 1 application topically daily as needed (itching).   furosemide 20 MG tablet Commonly known as: LASIX Take 20 mg by mouth every Monday, Wednesday, and Friday. In the morning   HYDROmorphone 2 MG tablet Commonly known as: DILAUDID Take 1 tablet (2 mg total) by mouth every 5 (five) hours as needed for moderate pain or severe pain. What changed: when to take this   L-LYSINE PO Take 500 mg by mouth in the morning.   losartan 25 MG tablet Commonly known as: COZAAR Take 1 tablet (25 mg total) by mouth daily.   Lubricant Eye Drops 0.4-0.3 % Soln Generic drug: Polyethyl Glycol-Propyl Glycol Place 1-2 drops into both eyes 3 (three) times daily as needed (dry/irritated eyes.).   magnesium hydroxide 400 MG/5ML suspension Commonly known as: MILK OF MAGNESIA Take 5 mLs by mouth daily as needed for mild constipation.   methocarbamol 500 MG tablet Commonly known as: ROBAXIN Take 1 tablet (500 mg total) by mouth every 6 (six) hours as needed for muscle spasms.   mineral oil liquid Take 90 mLs by mouth daily in the afternoon. 6  tablespoons in the afternoon.   multivitamin with minerals Tabs tablet Take 1 tablet by mouth in the morning. Mature mens 50+   nortriptyline 25 MG capsule Commonly known as: PAMELOR Take 25 mg by mouth at bedtime.   Oxycodone HCl 10 MG Tabs Take 1 tablet (10 mg total) by mouth every 4 (four) hours as needed for up to 7 days for severe pain ((score 7 to 10)). What changed:  medication strength how much to take when to take this reasons to take this   pregabalin 150 MG capsule Commonly known as: LYRICA Take 150 mg by mouth in the morning.   pyridOXINE 100 MG tablet Commonly known as: VITAMIN B6 Take 100 mg by mouth in  the morning.   Sennosides 15 MG Tabs Take 45 mg by mouth in the morning. Perdiem Stimulant Laxative Tablets   simvastatin 40 MG tablet Commonly known as: ZOCOR Take 40 mg by mouth at bedtime.   sodium chloride 0.65 % Soln nasal spray Commonly known as: OCEAN Place 1 spray into both nostrils at bedtime as needed for congestion.   SUPER B COMPLEX/C PO Take 1 capsule by mouth in the morning.   tamsulosin 0.4 MG Caps capsule Commonly known as: FLOMAX Take 0.8 mg by mouth daily.   tiZANidine 2 MG tablet Commonly known as: ZANAFLEX Take 2 mg by mouth every 8 (eight) hours as needed for muscle spasms.   traZODone 150 MG tablet Commonly known as: DESYREL Take 150 mg by mouth at bedtime.   Vitamin D3 50 MCG (2000 UT) capsule Take 2,000 Units by mouth daily.        Contact information for follow-up providers     Dawley, Troy C, DO. Call.   Contact information: [address] [place] [address] [place] [address] [phone]              Contact information for after-discharge care     Destination     [facility] .   Service: Skilled Nursing Contact information: [address] [place] [address] [phone]                     Signed: Clovis Riley 02/05/2023, 1:46 PM

## 2023-02-05 NOTE — TOC Transition Note (Signed)
Transition of Care [facility]) - CM/SW Discharge Note   Patient Details  Name: Aaron Schmidt MRN: [id number] Date of Birth: 06-May-1933  Transition of Care [facility]) CM/SW Contact:  Baldemar Lenis, LCSW Phone Number: 02/05/2023, 1:57 PM   Clinical Narrative:   CSW received insurance authorization for patient to admit to SNF, sent discharge to [facility]. Family at bedside arranging transportation for patient to return to SNF, he does not want PTAR.   Nurse to call report to [phone], Room 110.    Final next level of care: Skilled Nursing Facility Barriers to Discharge: Barriers Resolved   Patient Goals and CMS Choice CMS Medicare.gov Compare Post Acute Care list provided to:: Patient Choice offered to / list presented to : Patient  Discharge Placement                Patient chooses bed at: [facility] Patient to be transferred to facility by: Family Name of family member notified: Self, spouse at bedside Patient and family notified of of transfer: 02/05/23  Discharge Plan and Services Additional resources added to the After Visit Summary for       Post Acute Care Choice: Skilled Nursing Facility                               Social Determinants of Health (SDOH) Interventions SDOH Screenings   Food Insecurity: No Food Insecurity (01/31/2023)  Housing: Low Risk  (01/31/2023)  Transportation Needs: No Transportation Needs (01/31/2023)  Utilities: Not At Risk (01/31/2023)  Tobacco Use: Medium Risk (02/01/2023)     Readmission Risk Interventions     No data to display

## 2023-02-05 NOTE — Progress Notes (Signed)
Physical Therapy Treatment Patient Details Name: Aaron Schmidt MRN: [id number] DOB: [birth year] Today's Date: 02/05/2023   History of Present Illness Pt is an 89 y.o. male who presented 5/10 for open thoracic extension fusion of T9-11, removal of T12 screws. CT with T12 hardware loosening and L T12 fracture. PMHx: HTN, CAD, MI, OA, anemia, CKD stage 3, HLD, OSA, Parkinson's disease, peripheral neuralgia.    PT Comments    Pt progressing steadily towards his physical therapy goals this session, with improvements in activity tolerance and ambulation distance. Pt requiring moderate assist for transfers and ambulating 40 ft with a walker at a min assist level (+2 safety with equipment). Pt demonstrates decreased BLE flexibility, impaired standing balance with posterior lean, weakness, and decreased activity tolerance. Patient will benefit from continued inpatient follow up therapy, <3 hours/day in order to address deficits and maximize functional mobility.    Recommendations for follow up therapy are one component of a multi-disciplinary discharge planning process, led by the attending physician.  Recommendations may be updated based on patient status, additional functional criteria and insurance authorization.  Follow Up Recommendations  Can patient physically be transported by private vehicle: No    Assistance Recommended at Discharge Frequent or constant Supervision/Assistance  Patient can return home with the following A lot of help with bathing/dressing/bathroom;Assistance with cooking/housework;Direct supervision/assist for medications management;Direct supervision/assist for financial management;Assist for transportation;Help with stairs or ramp for entrance;A lot of help with walking and/or transfers   Equipment Recommendations  Other (comment) (defer to next venue of care)    Recommendations for Other Services       Precautions / Restrictions Precautions Precautions:  Fall;Back Precaution Booklet Issued: No Precaution Comments: hemovac; reviewed and provided cues to maintain precautions during session Required Braces or Orthoses: Spinal Brace Spinal Brace: Thoracolumbosacral orthotic;Applied in sitting position (clamshell) Restrictions Weight Bearing Restrictions: No     Mobility  Bed Mobility Overal bed mobility: Needs Assistance Bed Mobility: Rolling, Sidelying to Sit, Sit to Sidelying Rolling: Mod assist Sidelying to sit: Mod assist, +2 for safety/equipment, HOB elevated       General bed mobility comments: Cues for log roll technique, use of bed pad to facilitate bringing hips into sidelying position, use of rail, assist to guide BLE's off edge of bed and bring trunk to upright. Decreased initiation    Transfers Overall transfer level: Needs assistance Equipment used: Rolling walker (2 wheels), Ambulation equipment used Transfers: Sit to/from Stand Sit to Stand: From elevated surface, +2 safety/equipment, Mod assist           General transfer comment: ModA to rise to walker from edge of bed, rocking to gain momentum, initial retropulsion. Cues for anterior weight shift    Ambulation/Gait Ambulation/Gait assistance: Min assist, +2 safety/equipment Gait Distance (Feet): 40 Feet Assistive device: Rolling walker (2 wheels) Gait Pattern/deviations: Decreased stride length, Decreased dorsiflexion - right, Decreased dorsiflexion - left, Trunk flexed, Leaning posteriorly, Shuffle, Step-through pattern, Step-to pattern Gait velocity: decreased Gait velocity interpretation: <1.31 ft/sec, indicative of household ambulator   General Gait Details: Pt with one episode of posterior LOB, cues for anterior weight shift and upright posture, consistent minA for balance with bilateral knee instability and decreased bilateral heel strike at initial contact   Stairs             Wheelchair Mobility    Modified Rankin (Stroke Patients Only)        Balance Overall balance assessment: Needs assistance Sitting-balance support: Bilateral upper extremity supported, Feet supported  Sitting balance-Leahy Scale: Fair     Standing balance support: Bilateral upper extremity supported, During functional activity, Reliant on assistive device for balance Standing balance-Leahy Scale: Poor                              Cognition Arousal/Alertness: Awake/alert Behavior During Therapy: Anxious Overall Cognitive Status: Impaired/Different from baseline Area of Impairment: Memory, Safety/judgement, Attention, Following commands, Awareness, Problem solving                   Current Attention Level: Sustained Memory: Decreased short-term memory, Decreased recall of precautions Following Commands: Follows one step commands consistently, Follows one step commands with increased time, Follows multi-step commands inconsistently Safety/Judgement: Decreased awareness of safety, Decreased awareness of deficits Awareness: Emergent Problem Solving: Slow processing, Decreased initiation, Difficulty sequencing, Requires verbal cues, Requires tactile cues General Comments: STM deficits, pt does not recall receiving pain medication or meeting RN this morning. Preference for step by step cues.        Exercises      General Comments        Pertinent Vitals/Pain Pain Assessment Pain Assessment: Faces Faces Pain Scale: Hurts little more Pain Location: back Pain Descriptors / Indicators: Discomfort, Grimacing, Guarding, Sore Pain Intervention(s): Limited activity within patient's tolerance, Monitored during session, Premedicated before session    Home Living                          Prior Function            PT Goals (current goals can now be found in the care plan section) Acute Rehab PT Goals Patient Stated Goal: to return to his life PT Goal Formulation: With patient Time For Goal Achievement:  02/15/23 Potential to Achieve Goals: Fair Progress towards PT goals: Progressing toward goals    Frequency    Min 5X/week      PT Plan Current plan remains appropriate    Co-evaluation              AM-PAC PT "6 Clicks" Mobility   Outcome Measure  Help needed turning from your back to your side while in a flat bed without using bedrails?: A Lot Help needed moving from lying on your back to sitting on the side of a flat bed without using bedrails?: A Lot Help needed moving to and from a bed to a chair (including a wheelchair)?: A Little Help needed standing up from a chair using your arms (e.g., wheelchair or bedside chair)?: A Lot Help needed to walk in hospital room?: A Little Help needed climbing 3-5 steps with a railing? : Total 6 Click Score: 13    End of Session Equipment Utilized During Treatment: Gait belt;Back brace Activity Tolerance: Patient tolerated treatment well Patient left: with call bell/phone within reach;in chair;with chair alarm set Nurse Communication: Mobility status PT Visit Diagnosis: Unsteadiness on feet (R26.81);Other abnormalities of gait and mobility (R26.89);Muscle weakness (generalized) (M62.81);Difficulty in walking, not elsewhere classified (R26.2);Pain Pain - part of body:  (back)     Time: 1610-9604 PT Time Calculation (min) (ACUTE ONLY): 23 min  Charges:  $Gait Training: 8-22 mins $Therapeutic Activity: 8-22 mins                     Lillia Pauls, PT, DPT Acute Rehabilitation Services Office [phone]    Aaron Schmidt 02/05/2023, 12:07 PM

## 2023-03-06 ENCOUNTER — Ambulatory Visit: Payer: Medicare Other | Admitting: Neurology

## 2023-03-13 NOTE — Progress Notes (Unsigned)
Assessment/Plan:   1.  Parkinsons Disease   -Increase carbidopa/levodopa 25/100, 2 tablet/2 tablet/2 tablet.    -add carbidopa/levodopa 25/100 CR at bed  -told him to not change/add medication without talking to me   2.  Chronic low back pain  -On chronic Dilaudid.  PDMP reviewed.  Remain concerned about the amount of Dilaudid, although it has slowly decreased with the course of time, going from 150 tablets/month to 135 tablets/month and currently at about 120 tablets/month.  Pt c/o pain today being disabling and f/u with Dr. Jake Samples.  His inability to exercise does create issues with the Parkinsons Disease.  3.  eds  -Continue to suspect related to chronic narcotic therapy and the fact he sleeps all day and up all night (day/night reversal).    4.  Depression  -related primarily to pain issues.     Subjective:   Aaron Schmidt was seen today in follow up for Parkinsons disease.  Patient is with his wife who supplements the history.  I increased his levodopa last visit.  Not long thereafter, he called and said he wanted to increase the levodopa again because of spasms in his back and leg.  We told him he could not increase the levodopa, as that was not likely from Parkinson's disease, but likely coming from his back.  Patient was already on a lot of narcotic medication.  Not long thereafter, patient did undergo back surgery with Dr. Jake Samples on February 8.  He was back in the emergency room on March 8 complaining about back pain and spasms.  He underwent surgery again with Dr. Jake Samples on May 9 for T12/L1 nonunion with loosening of hardware.  He removed the T12 hardware.  Current prescribed movement disorder medications: Carbidopa/levodopa 25/100, 2 tablets 3 times per day (increased)   PREVIOUS MEDICATIONS: Sinemet  ALLERGIES:   Allergies  Allergen Reactions   Baclofen Other (See Comments)    CONFUSION, BODY TREMORS   Diazepam Other (See Comments)    "makes goofy" Other  reaction(s): confusion   Doxazosin Mesylate Other (See Comments)    Other reaction(s): low BP    CURRENT MEDICATIONS:  Outpatient Encounter Medications as of 03/14/2023  Medication Sig   aspirin EC 81 MG tablet Take 1 tablet (81 mg total) by mouth daily.   Calcium-Magnesium-Zinc (CAL-MAG-ZINC PO) Take 1 tablet by mouth daily.   carbidopa-levodopa (SINEMET IR) 25-100 MG tablet 2 tablets at 8am/2 tablets at noon/2 at 4pm (Patient taking differently: Take 1-2 tablets by mouth See admin instructions. Take 2 tablets by mouth at 0700, take 2 tablets by mouth at 1100, take 2 tablet by mouth at 1600 & take 2 tablet by at 1900.)   Carbidopa-Levodopa ER (SINEMET CR) 25-100 MG tablet controlled release Take 1 tablet by mouth at bedtime.   Cholecalciferol (VITAMIN D3) 50 MCG (2000 UT) capsule Take 2,000 Units by mouth daily.   docusate sodium (COLACE) 100 MG capsule Take 100 mg by mouth in the morning.   finasteride (PROSCAR) 5 MG tablet Take 5 mg by mouth in the morning.   fluocinonide cream (LIDEX) 0.05 % Apply 1 application topically daily as needed (itching).   furosemide (LASIX) 20 MG tablet Take 20 mg by mouth every Monday, Wednesday, and Friday. In the morning   HYDROmorphone (DILAUDID) 2 MG tablet Take 1 tablet (2 mg total) by mouth every 5 (five) hours as needed for moderate pain or severe pain. (Patient taking differently: Take 2 mg by mouth every 4 (four)  hours as needed for moderate pain or severe pain.)   L-LYSINE PO Take 500 mg by mouth in the morning.   losartan (COZAAR) 25 MG tablet Take 1 tablet (25 mg total) by mouth daily.   magnesium hydroxide (MILK OF MAGNESIA) 400 MG/5ML suspension Take 5 mLs by mouth daily as needed for mild constipation.   methocarbamol (ROBAXIN) 500 MG tablet Take 1 tablet (500 mg total) by mouth every 6 (six) hours as needed for muscle spasms.   mineral oil liquid Take 90 mLs by mouth daily in the afternoon. 6 tablespoons in the afternoon.   Multiple Vitamin  (MULTIVITAMIN WITH MINERALS) TABS tablet Take 1 tablet by mouth in the morning. Mature mens 50+   nortriptyline (PAMELOR) 25 MG capsule Take 25 mg by mouth at bedtime.    Polyethyl Glycol-Propyl Glycol (LUBRICANT EYE DROPS) 0.4-0.3 % SOLN Place 1-2 drops into both eyes 3 (three) times daily as needed (dry/irritated eyes.).   pregabalin (LYRICA) 150 MG capsule Take 150 mg by mouth in the morning.   pyridOXINE (VITAMIN B-6) 100 MG tablet Take 100 mg by mouth in the morning.   Sennosides 15 MG TABS Take 45 mg by mouth in the morning. Perdiem Stimulant Laxative Tablets   simvastatin (ZOCOR) 40 MG tablet Take 40 mg by mouth at bedtime.   sodium chloride (OCEAN) 0.65 % SOLN nasal spray Place 1 spray into both nostrils at bedtime as needed for congestion.   SUPER B COMPLEX/C PO Take 1 capsule by mouth in the morning.   tamsulosin (FLOMAX) 0.4 MG CAPS capsule Take 0.8 mg by mouth daily.   tiZANidine (ZANAFLEX) 2 MG tablet Take 2 mg by mouth every 8 (eight) hours as needed for muscle spasms.   traZODone (DESYREL) 150 MG tablet Take 150 mg by mouth at bedtime.   vitamin B-12 (CYANOCOBALAMIN) 1000 MCG tablet Take 1,000 mcg by mouth in the morning.   vitamin C (ASCORBIC ACID) 500 MG tablet Take 500 mg by mouth in the morning.   No facility-administered encounter medications on file as of 03/14/2023.    Objective:   PHYSICAL EXAMINATION:    VITALS:   There were no vitals filed for this visit.      GEN:  The patient appears stated age and is in NAD. HEENT:  Normocephalic, atraumatic.  The mucous membranes are moist. The superficial temporal arteries are without ropiness or tenderness. CV:  RRR Lungs:  CTAB Neck/HEME:  There are no carotid bruits bilaterally.  Neurological examination:  Orientation: The patient is alert and oriented x3. Cranial nerves: There is good facial symmetry with facial hypomimia. The speech is fluent and clear. Soft palate rises symmetrically and there is no tongue  deviation. Hearing is intact to conversational tone. Sensation: Sensation is intact to light touch throughout Motor: Strength is at least antigravity x4.  Movement examination: Tone: There is mild increased tone on the L (he is due for medication) Abnormal movements: none Coordination:  There is mild decremation bilaterally, L>R, LE>UE Gait and Station: Patient pushes off of the chair to arise.  The patient's stride length is decreased and he c/o pain with ambulation  I have reviewed and interpreted the following labs independently    Chemistry      Component Value Date/Time   NA 135 01/21/2023 1117   NA 142 06/30/2019 0905   K 3.9 01/21/2023 1117   CL 102 01/21/2023 1117   CO2 26 01/21/2023 1117   BUN 23 01/21/2023 1117   BUN 23  06/30/2019 0905   CREATININE 0.93 01/21/2023 1117      Component Value Date/Time   CALCIUM 9.1 01/21/2023 1117   ALKPHOS 69 07/21/2020 2119   AST 56 (H) 07/21/2020 2119   ALT 20 07/21/2020 2119   BILITOT 1.1 07/21/2020 2119       Lab Results  Component Value Date   WBC 7.0 01/21/2023   HGB 9.7 (L) 01/21/2023   HCT 30.4 (L) 01/21/2023   MCV 88.9 01/21/2023   PLT 188 01/21/2023    Lab Results  Component Value Date   TSH 0.731 09/21/2017     Total time spent on today's visit was *** minutes, including both face-to-face time and nonface-to-face time.  Time included that spent on review of records (prior notes available to me/labs/imaging if pertinent), discussing treatment and goals, answering patient's questions and coordinating care.  Cc:  Nadara Eaton, MD

## 2023-03-14 ENCOUNTER — Encounter: Payer: Self-pay | Admitting: Neurology

## 2023-03-14 ENCOUNTER — Ambulatory Visit: Payer: Medicare Other | Admitting: Neurology

## 2023-03-14 VITALS — BP 108/60 | HR 68 | Ht 70.0 in | Wt 155.0 lb

## 2023-03-14 DIAGNOSIS — G20A1 Parkinson's disease without dyskinesia, without mention of fluctuations: Secondary | ICD-10-CM

## 2023-03-14 DIAGNOSIS — G8929 Other chronic pain: Secondary | ICD-10-CM | POA: Diagnosis not present

## 2023-03-14 DIAGNOSIS — M5442 Lumbago with sciatica, left side: Secondary | ICD-10-CM

## 2023-03-14 MED ORDER — CARBIDOPA-LEVODOPA 25-100 MG PO TABS: ORAL_TABLET | ORAL | 1 refills | Status: DC

## 2023-03-14 NOTE — Patient Instructions (Addendum)
We discussed a nonprofit medical company called [employer].  They have all kinds of medical equipment including wheelchairs, walkers, canes and incontinence materials, which is free of charge.  Their phone number is [phone].  Their email is [email or web address].  Feel free to reach out to them with questions.  I believe that they are only open for pick up on Tuesdays but you can contact them other days of the week.  You can continue taking the carbidopa/levodopa 25/100, 2 tablets at 8am/2 tablets at noon/2 at 4pm/2 at 7pm and carbidopa/levodopa 25/100 CR, one at bedtime.  DO not increase more than this!!  Local and Online Resources for Power over Parkinson's Group  June 2024   LOCAL [place] PARKINSON'S GROUPS   Power over Parkinson's Group:    Power Over Parkinson's Patient Education Group will be Wednesday, JULY 10th-*PLEASE NOTE:  We will not be having a June meeting.  Our next meeting will be in July.  We apologize for the inconvenience.   Power over [employer] and Care Partner Groups will meet together, with plans for separate break out session for caregivers, depending on topic/speaker Upcoming Power over Parkinson's Meetings/Care Partner Support:  2nd Wednesdays of the month at 2 pm:   No regular June meeting, July 10th  Contact Amy Marriott at [email or web address] or Lynwood Dawley at [email or web address] if interested in participating in this group    LOCAL EVENTS AND NEW OFFERINGS  PLEASE NOTE:  There will be no June Power over [employer] meeting [facility] June 12th) Picnic Party for [employer].   [facility] Movement Disorders Program Celebration.  June 19th 1-3 pm.  Contact Lynwood Dawley at [email or web address] Parkinson's Social Game Night.  First Thursday of each month, 2:00-4:00 pm.  *Next date is June 6th*.  Rossie Muskrat [employer], [employer].  Contact [email or web address] if interested. Parkinson's  CarePartner Group for Men is in the works, if interested email [email or web address] ACT [employer] Chair Yoga classes "Train and Gain", Fridays 10 am, [employer].  Contact Gina at [phone].  Health visitor Classes offering at [employer]!  TUESDAYS (Chair Yoga)  and Wednesdays (PWR! Moves)  1:00 pm.   Contact Synetta Shadow at  [email or web address]  or [phone]  Antoine Poche! Moves classes.  Thursdays at 11:45, [facility].  Free to participate through [employer].  Contact Lynwood Dawley at [email or web address] or [phone] to register Drumming for Parkinson's will be held on 2nd and 4th Mondays at 11:00 am.   Located at the [place] of the [employer] ([address] [place].)  Contact Albertina Parr at [email or web address] or [phone]  [facility] Parkinson's Tai Chi Class, Mondays at 11 am.  Call [phone] for details   [facility] EDUCATION AND SUPPORT  Parkinson Foundation:  www.parkinson.org  PD Health at Home continues:  Mindfulness Mondays, Wellness Wednesdays, Fitness Fridays  (PWR! Moves as part of Fitness Fridays March 22nd, 1-1:45 pm) Upcoming Education:   Current and Emerging Methods to Aid Parkinson's Diagnosis.  Wednesday, May 29th, 1-2 pm Recognize and Respond to Parkinson's Psychosis.  Wednesday, June 5th, 1-2 pm Parkinsonisms.  Wednesday, June 12th, 1-2 pm Expert Briefing:  Addressing the Challenge of Apathy in Parkinson's.  Wednesday, September 11th, 1-2 pm. Register for virtual education and Photographer (webinars) at [email or web address] Please check out their website to sign up for emails and see their full online offerings     [employer]:  www.[email or web address]   Third Thursday Webinars:  On the  third Thursday of every month at 12 p.m. ET, join our free live webinars to learn about  various aspects of living with Parkinson's disease and our work to speed medical breakthroughs.  Upcoming Webinar:  You Want to [employer] for Parkinson's Research: Now What?  Thursday, June 20th at 12 noon. Check out additional information on their website to see their full online offerings    [facility]:  [email or web address]  Upcoming Webinar:  Living Safely with Parkinson's Inside and Outside of your Home.  Thursday, June 13th , 3 pm Series:  Living with Parkinson's Meetup.   Third Thursdays each month, 3 pm  Care Partner Monthly Meetup.  With Jillene Bucks [place].  First Tuesday of each month, 2 pm  Check out additional information to Live Well Today on their website    Parkinson and Movement Disorders (PMD) Alliance:  www.pmdalliance.org  [employer]:  Online Education Events  Sign up for emails, which are sent weekly to give you updates on programming and online offerings    Parkinson's Association of the [place]:  www.parkinsonassociation.org  Information on online support groups, education events, and online exercises including Yoga, Parkinson's exercises and more-LOTS of information on links to PD resources and online events  Virtual Support Group through Parkinson's Association of the [place]; next one is scheduled for Wednesday, June 5th   MOVEMENT AND EXERCISE OPPORTUNITIES  Parkinson's Exercise Class offerings at [employer]. *TUESDAYS* (Chair yoga) and Wednesdays (PWR! Moves)  1:00 pm.   *Please note that PWR! Moves [place] class has ended.  Please consider trying PWR! Moves on Wednesdays at 1 pm at [facility].  Contact Synetta Shadow at [email or web address]    Parkinson's Wellness Recovery (PWR! Moves)  www.[email or web address]  Info on the PWR! Virtual Experience:  You will have access to our expertise?through self-assessment, guided plans that start with the PD-specific fundamentals, educational content, tips, Q&A with an expert, and  a growing Engineering geologist of PD-specific pre-recorded and live exercise classes of varying types and intensity - both physical and cognitive! If that is not enough, we offer 1:1 wellness consultations (in-person or virtual) to personalize your PWR! Dance movement psychotherapist.   Parkinson [employer] Fridays:   As part of the PD Health @ Home program, this free video series focuses each week on one aspect of fitness designed to support people living with Parkinson's.? These weekly videos highlight the Parkinson Foundation fitness guidelines for people with Parkinson's disease.  [email or web address]  Dance for PD website is offering free, live-stream classes throughout the week, as well as links to [employer] of classes:  [email or web address]  Virtual dance and Pilates for Parkinson's classes: Click on the Community Tab> Parkinson's Movement Initiative Tab.  To register for classes and for more information, visit www.[email or web address] and click the "community" tab.   [employer] Parkinson's Cycling Classes   [facility]:  Thursdays @ Noon-Live classes at [employer] ([employer] at [email or web address]?or [phone])  [employer]: Classes Tuesday, Wednesday and Thursday (contact [place] at [email or web address] ?or [phone])  [facility] [employer]  Varied levels of classes are offered Tuesdays and Thursdays at [employer].   Stretching with Byrd Hesselbach weekly class is also offered for people with Parkinson's  To observe a class or for more information, call [phone] or email Patricia Nettle at [email or web address]   ADDITIONAL SUPPORT AND RESOURCES  [employer]:  Online Caregiver Education Opportunities:  www.well-springsolutions.org/caregiver-education/caregiver-support-group.  You may also contact Loleta Chance at [facility] -spring.org or [phone].     Well[employer]Spring  Navigator:  [employer],  a?free service to help individuals and families through the journey of determining care for older adults.  The "Navigator" is a Child psychotherapist, Sidney Ace, who will speak with a prospective client and/or loved ones to provide an assessment of the situation and a set of recommendations for a personalized care plan -- all free of charge, and whether?[employer] offers the needed service or not. If the need is not a service we provide, we are well-connected with reputable programs in town that we can refer you to.  www.well-springsolutions.org or to speak with the Navigator, call [phone].

## 2023-04-01 ENCOUNTER — Encounter (HOSPITAL_BASED_OUTPATIENT_CLINIC_OR_DEPARTMENT_OTHER): Payer: Self-pay

## 2023-04-01 ENCOUNTER — Emergency Department (HOSPITAL_BASED_OUTPATIENT_CLINIC_OR_DEPARTMENT_OTHER)
Admission: EM | Admit: 2023-04-01 | Discharge: 2023-04-02 | Disposition: A | Discharge status: Home / Self Care | Payer: Medicare Other | Source: Home / Self Care | Attending: Emergency Medicine | Admitting: Emergency Medicine

## 2023-04-01 ENCOUNTER — Other Ambulatory Visit: Payer: Self-pay

## 2023-04-01 ENCOUNTER — Emergency Department (HOSPITAL_BASED_OUTPATIENT_CLINIC_OR_DEPARTMENT_OTHER): Payer: Medicare Other

## 2023-04-01 DIAGNOSIS — J90 Pleural effusion, not elsewhere classified: Secondary | ICD-10-CM | POA: Insufficient documentation

## 2023-04-01 DIAGNOSIS — N183 Chronic kidney disease, stage 3 unspecified: Secondary | ICD-10-CM | POA: Insufficient documentation

## 2023-04-01 DIAGNOSIS — I2510 Atherosclerotic heart disease of native coronary artery without angina pectoris: Secondary | ICD-10-CM | POA: Insufficient documentation

## 2023-04-01 DIAGNOSIS — W19XXXA Unspecified fall, initial encounter: Secondary | ICD-10-CM | POA: Diagnosis not present

## 2023-04-01 DIAGNOSIS — M545 Low back pain, unspecified: Secondary | ICD-10-CM | POA: Diagnosis not present

## 2023-04-01 DIAGNOSIS — I129 Hypertensive chronic kidney disease with stage 1 through stage 4 chronic kidney disease, or unspecified chronic kidney disease: Secondary | ICD-10-CM | POA: Insufficient documentation

## 2023-04-01 DIAGNOSIS — G20A1 Parkinson's disease without dyskinesia, without mention of fluctuations: Secondary | ICD-10-CM | POA: Insufficient documentation

## 2023-04-01 DIAGNOSIS — M549 Dorsalgia, unspecified: Secondary | ICD-10-CM | POA: Diagnosis present

## 2023-04-01 DIAGNOSIS — Z87891 Personal history of nicotine dependence: Secondary | ICD-10-CM | POA: Diagnosis not present

## 2023-04-01 MED ORDER — HYDROMORPHONE HCL 1 MG/ML IJ SOLN: 2.0000 mg | Freq: Once | INTRAMUSCULAR | Status: DC

## 2023-04-01 MED ORDER — HYDROMORPHONE HCL 1 MG/ML IJ SOLN
1.0000 mg | Freq: Once | INTRAMUSCULAR | Status: AC
  Administered 2023-04-01: 1 mg via INTRAMUSCULAR
  Filled 2023-04-01: qty 1

## 2023-04-01 NOTE — ED Triage Notes (Addendum)
Pt reports she had a fall on July 4th after tripping over his walker, landed onto his left shoulder and fell onto the hardwood floor. Denies LOC, denies head injury, denies taking blood thinners. He has had 2 recent back surgeries since February of this year (February and May). He presents with a back brace on. He reports pain to his whole back and into his chest.

## 2023-04-01 NOTE — ED Provider Triage Note (Cosign Needed Addendum)
Emergency Medicine Provider Triage Evaluation Note  Aaron Schmidt , a 89 y.o. male  was evaluated in triage.  Pt complains of back pain after a fall. Tripped over walker on July 4th and fell on left side of body onto hardwood floors. Denies head injury and LOC. Report left shoulder pain, lower midline back pain. Had back surgery in may and feb of this year. Wears a back brace. Reports chronic lower extremity numbness bilaterally. Denies urinary or bowel changes. Denies abdominal and chest pain. Denies use of blood thinners. Still able to ambulate with walker.   Review of Systems  Positive: See above Negative: See above  Physical Exam  BP 106/63 (BP Location: Left Arm)   Pulse 78   Temp 98.4 F (36.9 C) (Oral)   Resp 16   Ht 5\' 10"  (1.778 m)   Wt 70.3 kg   SpO2 99%   BMI 22.24 kg/m  Gen:   Awake, no distress   Resp:  Normal effort  MSK:   Moves extremities without difficulty  Other:    Medical Decision Making  Medically screening exam initiated at 5:13 PM.  Appropriate orders placed.  Aaron Schmidt was informed that the remainder of the evaluation will be completed by another provider, this initial triage assessment does not replace that evaluation, and the importance of remaining in the ED until their evaluation is complete.  Work up started   Gareth Eagle, PA-C 04/01/23 1719

## 2023-04-01 NOTE — ED Provider Notes (Signed)
[facility] Emergency Department Provider Note MRN:  [id number]  Arrival date & time: 04/02/23     Chief Complaint   Fall   History of Present Illness   Aaron Schmidt is a 89 y.o. year-old male with a history of chronic back pain, CAD, multiple back surgeries presenting to the ED with chief complaint of back pain.  Patient fell over his walker 4 days ago, having continued back pain, abdominal pain since then.  Denies shortness of breath, no neck pain, did not hit his head, no blood thinners.  Has old TLSO brace for support.  Review of Systems  A thorough review of systems was obtained and all systems are negative except as noted in the HPI and PMH.   Patient's Health History    Past Medical History:  Diagnosis Date   Acute kidney injury superimposed on chronic kidney disease (HCC) 09/05/2017   Anemia 09/21/2017   Anemia due to chronic blood loss 01/10/2021   Arthritis    Degeneration spine & stenosis   Benign prostatic hyperplasia 01/10/2021   Benign prostatic hyperplasia with lower urinary tract symptoms 01/10/2021   Body mass index (BMI) 25.0-25.9, adult 08/31/2019   CAD (coronary artery disease) 04/20/2019   Cervical myelopathy with cervical radiculopathy (HCC) 06/24/2014   Cervical spondylosis without myelopathy 05/12/2014   Chronic bilateral low back pain 07/26/2020   Chronic kidney disease, stage 3 unspecified (HCC) 01/10/2021   Chronic low back pain 07/29/2013   Chronic pain 09/05/2017   Degenerative cervical spinal stenosis 05/26/2014   Degenerative lumbar spinal stenosis 09/22/2019   Displacement of cervical intervertebral disc 07/19/2014   Edema 01/10/2021   Elevated troponin 09/05/2017   Empyema lung (HCC) 09/09/2017   Enlarged prostate 01/10/2021   Essential hypertension 08/29/2010   Qualifier: Diagnosis of  By: Vassie Loll MD, [place] Locket     Fall in home 07/10/2017   Hardening of the aorta (main artery of the heart) (HCC) 01/10/2021   History of  kidney stones    Hyperlipidemia 06/21/2014   Hypertension    has a histroy of   Hypoalbuminemia 01/10/2021   Hyponatremia 09/05/2017   Hypotension    Impaired fasting glucose 01/10/2021   Intermittent tremor 01/10/2021   Iron deficiency anemia 01/10/2021   Kidney stone 01/10/2021   LBBB (left bundle branch block) 06/21/2014   Lumbar post-laminectomy syndrome 07/15/2013   Lumbar radiculopathy 07/29/2013   Lumbar stenosis with neurogenic claudication 08/31/2019   Malnutrition of moderate degree 03/23/2019   Mitral valve disorder 01/10/2021   Murmur 01/08/2017   Muscle pain 03/01/2020   Myocardial infarction (HCC) 03/21/2019   Neck pain 03/03/2014   Normochromic normocytic anemia 09/05/2017   NSTEMI (non-ST elevated myocardial infarction) (HCC) 03/21/2019   Obesity 04/06/2019   Obstructive sleep apnea 09/30/2010   PSG (180 lbs ) >> severe obstructive sleep apnea with AHi 38/h & desatns to 82%  Started on auto 5-15  >> changed to 8 cm Download reviewed 1/21 - 11/23/10 >> residual events of 6/h , avg pr 8 cm, leak + with nasal pillows     Occasional tremors 07/26/2020   Old myocardial infarction 01/10/2021   Osteoarthrosis 01/10/2021   Osteopenia 01/10/2021   Other specified disorders of kidney and ureter 01/10/2021   Parkinson's disease    Peripheral edema 01/10/2021   Peripheral neuralgia 01/10/2021   Personal history of colonic polyps 01/10/2021   Personal history of other diseases of the respiratory system 01/10/2021   Pleural effusion on left 08/19/2017  Acute symptoms since 08/13/17 > CTa  08/13/17  neg pe/ extensively loculated effusion - L thoracentesis 08/20/2017 :  220 cc with glucose < 20 and WBC 5,338 mostly Poly, LDH 688 > cyt neg >Referrd to T surgery > VATS 09/09/17 c/w empyema   Pleural effusion, left 09/04/2017   Preoperative cardiovascular examination 06/21/2014   Protein-calorie malnutrition, severe 09/11/2017   Pure hypercholesterolemia 01/10/2021   Sciatica 01/10/2021   Scoliosis concern  08/31/2019   Sensorineural hearing loss (SNHL) of both ears 06/15/2020   Last Assessment & Plan:  Formatting of this note might be different from the original. Concern over worsening hearing. Chronic history of sensorineural hearing loss bilaterally.  Has hearing aids currently.  He feels his hearing aids are not working as well as they historically have.  Recently had them checked. EXAM shows normal external canals and tympanic membranes bilaterally. AUDIOGRAM Shows B   Shoulder joint pain 12/01/2013   Sinusitis, chronic 08/19/2017   CT sinus  08/20/2017 >>> Normally aerated paranasal sinuses.  Patent sinus drainage pathways.   SIRS (systemic inflammatory response syndrome) (HCC) 09/05/2017   Skin ulcer of toe of left foot, limited to breakdown of skin (HCC) 09/04/2016   Sleep apnea 2012   used CPAP 2 yrs. ago, feels he sleeps better w/o, no longer using    Spinal stenosis of lumbar region 08/10/2013   Spinal stenosis, lumbar region without neurogenic claudication 08/19/2019   Symptomatic anemia 09/20/2017   Thrombocytopenia (HCC) 01/10/2021   Tubular adenoma of colon 01/10/2021   Unspecified abnormal finding in specimens from other organs, systems and tissues 01/10/2021    Past Surgical History:  Procedure Laterality Date   ANTERIOR LAT LUMBAR FUSION N/A 09/22/2019   Procedure: Lumbar one-two, Lumbar two-three Anterolateral lumbar interbody fusion with pedicle screw fixation and exploration of adjacent fusion;  Surgeon: Maeola Harman, MD;  Location: [facility] OR;  Service: Neurosurgery;  Laterality: N/A;   ARM NEUROPLASTY     at 14 yrs. of age- fell off tractor- had fracture & repair *& later- 1990's had  transplantation of a nerve at the elbow   BACK SURGERY     x4 back surgery x2 fusion -   BACK SURGERY  11/01/2022   T12-L1 posterior lateral instrumentation and fusion   CARDIAC CATHETERIZATION  03/23/2019   CARPAL TUNNEL RELEASE Right    COLONOSCOPY     ESOPHAGOGASTRODUODENOSCOPY (EGD) WITH  PROPOFOL N/A 09/23/2017   Procedure: ESOPHAGOGASTRODUODENOSCOPY (EGD) WITH PROPOFOL;  Surgeon: Carman Ching, MD;  Location: [facility] ENDOSCOPY;  Service: Endoscopy;  Laterality: N/A;   LAMINECTOMY WITH POSTERIOR LATERAL ARTHRODESIS LEVEL 2 N/A 01/31/2023   Procedure: OPEN THORACIC EXTENSION FUSION THORACIC NINE-THORACIC TEN, THORACIC ELEVEN, REMOVAL HARDWARE THORACIC TWELVE WITH CEMENT AUGMENTATION;  Surgeon: Dawley, Alan Mulder, DO;  Location: [facility] OR;  Service: Neurosurgery;  Laterality: N/A;  3C   LEFT HEART CATH AND CORONARY ANGIOGRAPHY N/A 03/23/2019   Procedure: LEFT HEART CATH AND CORONARY ANGIOGRAPHY;  Surgeon: Kathleene Hazel, MD;  Location: [facility] INVASIVE CV LAB;  Service: Cardiovascular;  Laterality: N/A;   LUMBAR PERCUTANEOUS PEDICLE SCREW 2 LEVEL N/A 09/22/2019   Procedure: LUMBAR PERCUTANEOUS PEDICLE SCREW TWO LEVEL;  Surgeon: Maeola Harman, MD;  Location: [facility] OR;  Service: Neurosurgery;  Laterality: N/A;   PLEURAL EFFUSION DRAINAGE Left 09/09/2017   Procedure: DRAINAGE OF PLEURAL EFFUSION;  Surgeon: Loreli Slot, MD;  Location: [facility] OR;  Service: Thoracic;  Laterality: Left;   POSTERIOR CERVICAL FUSION/FORAMINOTOMY N/A 06/24/2014   Procedure: Posterior Cervical Three-Seven Fusion  with Lateral Mass Fixation;  Surgeon: Maeola Harman, MD;  Location: [facility] NEURO ORS;  Service: Neurosurgery;  Laterality: N/A;  C3-C7 posterior cervical fusion with lateral mass fixation   SHOULDER SURGERY Bilateral    VIDEO ASSISTED THORACOSCOPY (VATS)/DECORTICATION Left 09/09/2017   Procedure: VIDEO ASSISTED THORACOSCOPY (VATS)/DECORTICATION;  Surgeon: Loreli Slot, MD;  Location: [facility] OR;  Service: Thoracic;  Laterality: Left;   VIDEO BRONCHOSCOPY  09/09/2017   Procedure: VIDEO BRONCHOSCOPY;  Surgeon: Loreli Slot, MD;  Location: [facility] OR;  Service: Thoracic;;    Family History  Problem Relation Age of Onset   Pancreatic cancer Mother    Leukemia Son     Social History   Socioeconomic  History   Marital status: Married    Spouse name: Venita Sheffield   Number of children: 0   Years of education: Not on file   Highest education level: Not on file  Occupational History   Occupation: Retired    Comment: Worked for [employer] and retired in 1992  Tobacco Use   Smoking status: Former    Types: Pipe    Quit date: 09/24/1978    Years since quitting: 44.5   Smokeless tobacco: Never  Vaping Use   Vaping Use: Never used  Substance and Sexual Activity   Alcohol use: Not Currently   Drug use: No   Sexual activity: Not on file  Other Topics Concern   Not on file  Social History Narrative   Right Handed    Lives in a one story home - on the 2nd floor with elevators      Had 1 child that passed away   Social Determinants of Health   Financial Resource Strain: Not on file  Food Insecurity: No Food Insecurity (01/31/2023)   Hunger Vital Sign    Worried About Running Out of Food in the Last Year: Never true    Ran Out of Food in the Last Year: Never true  Transportation Needs: No Transportation Needs (01/31/2023)   PRAPARE - Administrator, Civil Service (Medical): No    Lack of Transportation (Non-Medical): No  Physical Activity: Not on file  Stress: Not on file  Social Connections: Not on file  Intimate Partner Violence: Not At Risk (01/31/2023)   Humiliation, Afraid, Rape, and Kick questionnaire    Fear of Current or Ex-Partner: No    Emotionally Abused: No    Physically Abused: No    Sexually Abused: No     Physical Exam   Vitals:   04/01/23 2116 04/01/23 2315  BP: 128/70 (!) 147/69  Pulse: 86 92  Resp: 16 16  Temp: 98.6 F (37 C)   SpO2: 99% 96%    CONSTITUTIONAL: Well-appearing, NAD NEURO/PSYCH:  Alert and oriented x 3, normal and symmetric strength and sensation, normal coordination, normal speech EYES:  eyes equal and reactive ENT/NECK:  no LAD, no JVD CARDIO: Regular rate, well-perfused, normal S1 and S2 PULM:  CTAB no wheezing or rhonchi GI/GU:   non-distended, mild suprapubic tenderness to palpation MSK/SPINE:  No gross deformities, no edema SKIN:  no rash, atraumatic   *Additional and/or pertinent findings included in MDM below  Diagnostic and Interventional Summary    EKG Interpretation Date/Time:    Ventricular Rate:    PR Interval:    QRS Duration:    QT Interval:    QTC Calculation:   R Axis:      Text Interpretation:         Labs Reviewed -  No data to display  CT CHEST ABDOMEN PELVIS WO CONTRAST  Final Result    CT Lumbar Spine Wo Contrast  Final Result    CT Thoracic Spine Wo Contrast  Final Result      Medications  HYDROmorphone (DILAUDID) injection 1 mg (1 mg Intramuscular Given 04/01/23 2321)  HYDROmorphone (DILAUDID) injection 1 mg (1 mg Intramuscular Given 04/02/23 0033)     Procedures  /  Critical Care Procedures  ED Course and Medical Decision Making  Initial Impression and Ddx Initial diagnosis includes compression fracture, hardware malfunction, pneumothorax, blunt injury to kidney.  CT lumbar and thoracic spine obtained in triage revealing no acute fractures but a right-sided pleural effusion larger than prior imaging.  Will obtain a more focused CT evaluation of the abdomen and chest to ensure no signs of more worrisome acute blunt trauma.  Favoring more of a chronic oral effusion given that patient is tolerating it quite well with normal vital signs, no hypoxia, no increased work of breathing.  Not complaining of shortness of breath.  Past medical/surgical history that increases complexity of ED encounter: Multiple back surgeries, chronic pain  Interpretation of Diagnostics CT imaging revealing postsurgical findings of the spine without significant changes, no acute fracture.  Moderate to large pleural effusion on the right, no other evidence of significant trauma to the lung or intra-abdominal organs.  Patient Reassessment and Ultimate Disposition/Management     Patient's pain is  adequately controlled, he remains neurologically intact, no increased work of breathing, normal vital signs.  He may benefit from a nonemergent thoracentesis, he will follow-up with PCP/neurology.  Patient management required discussion with the following services or consulting groups:  None  Complexity of Problems Addressed Acute illness or injury that poses threat of life of bodily function  Additional Data Reviewed and Analyzed Further history obtained from: Further history from spouse/family member  Additional Factors Impacting ED Encounter Risk Consideration of hospitalization  Elmer Sow. Pilar Plate, MD [facility] Emergency Medicine [facility] [email or web address]  Final Clinical Impressions(s) / ED Diagnoses     ICD-10-CM   1. Fall, initial encounter  W19.XXXA     2. Low back pain, unspecified back pain laterality, unspecified chronicity, unspecified whether sciatica present  M54.50     3. Pleural effusion  J90       ED Discharge Orders          Ordered    lidocaine (LIDODERM) 5 %  Every 24 hours        04/02/23 0040    naproxen (NAPROSYN) 500 MG tablet  2 times daily        04/02/23 0040             Discharge Instructions Discussed with and Provided to Patient:     Discharge Instructions      You were evaluated in the Emergency Department and after careful evaluation, we did not find any emergent condition requiring admission or further testing in the hospital.  Your exam/testing today is overall reassuring.  No new broken bones or complications to your lower back/spine.  Recommend continuing your home pain medications.  Would also recommend Naprosyn twice daily for pain, can also use the numbing patches daily.  Your CT imaging showed that you have a fluid collection near your right lung.  Recommend follow-up with your PCP or pulmonologist to discuss future management.  Please return to the Emergency Department if you experience any worsening  of your condition such as uncontrolled pain or  shortness of breath..   Thank you for allowing [place] to be a part of your care.       Sabas Sous, MD 04/02/23 [phone]

## 2023-04-02 MED ORDER — HYDROMORPHONE HCL 1 MG/ML IJ SOLN
1.0000 mg | Freq: Once | INTRAMUSCULAR | Status: AC
  Administered 2023-04-02: 1 mg via INTRAMUSCULAR
  Filled 2023-04-02: qty 1

## 2023-04-02 MED ORDER — LIDOCAINE 5 % EX PTCH: 1.0000 | MEDICATED_PATCH | CUTANEOUS | 0 refills | Status: DC

## 2023-04-02 MED ORDER — NAPROXEN 500 MG PO TABS: 500.0000 mg | ORAL_TABLET | Freq: Two times a day (BID) | ORAL | 0 refills | Status: DC

## 2023-04-02 NOTE — Discharge Instructions (Signed)
You were evaluated in the Emergency Department and after careful evaluation, we did not find any emergent condition requiring admission or further testing in the hospital.  Your exam/testing today is overall reassuring.  No new broken bones or complications to your lower back/spine.  Recommend continuing your home pain medications.  Would also recommend Naprosyn twice daily for pain, can also use the numbing patches daily.  Your CT imaging showed that you have a fluid collection near your right lung.  Recommend follow-up with your PCP or pulmonologist to discuss future management.  Please return to the Emergency Department if you experience any worsening of your condition such as uncontrolled pain or shortness of breath..   Thank you for allowing [place] to be a part of your care.

## 2023-04-30 ENCOUNTER — Telehealth: Payer: Self-pay | Admitting: Neurology

## 2023-04-30 ENCOUNTER — Other Ambulatory Visit: Payer: Self-pay

## 2023-04-30 MED ORDER — CARBIDOPA-LEVODOPA ER 25-100 MG PO TBCR: 1.0000 | EXTENDED_RELEASE_TABLET | Freq: Every day | ORAL | 0 refills | Status: DC

## 2023-04-30 MED ORDER — CARBIDOPA-LEVODOPA 25-100 MG PO TABS: ORAL_TABLET | ORAL | 0 refills | Status: DC

## 2023-04-30 NOTE — Telephone Encounter (Signed)
Called pateint and refilled medication

## 2023-04-30 NOTE — Telephone Encounter (Signed)
Patient is needing a refill on Carbidopa-Levodopa 25-100mg 

## 2023-05-28 DIAGNOSIS — M4325 Fusion of spine, thoracolumbar region: Secondary | ICD-10-CM | POA: Insufficient documentation

## 2023-05-29 ENCOUNTER — Encounter: Payer: Self-pay | Admitting: Cardiology

## 2023-05-29 ENCOUNTER — Ambulatory Visit: Payer: Medicare Other | Attending: Cardiology | Admitting: Cardiology

## 2023-05-29 VITALS — BP 110/56 | HR 73 | Ht 70.0 in | Wt 165.0 lb

## 2023-05-29 DIAGNOSIS — I2510 Atherosclerotic heart disease of native coronary artery without angina pectoris: Secondary | ICD-10-CM | POA: Diagnosis not present

## 2023-05-29 DIAGNOSIS — I10 Essential (primary) hypertension: Secondary | ICD-10-CM

## 2023-05-29 DIAGNOSIS — I447 Left bundle-branch block, unspecified: Secondary | ICD-10-CM

## 2023-05-29 DIAGNOSIS — I340 Nonrheumatic mitral (valve) insufficiency: Secondary | ICD-10-CM | POA: Diagnosis not present

## 2023-05-29 MED ORDER — FUROSEMIDE 20 MG PO TABS: 20.0000 mg | ORAL_TABLET | Freq: Every day | ORAL | 3 refills | Status: DC

## 2023-05-29 MED ORDER — FUROSEMIDE 20 MG PO TABS: 20.0000 mg | ORAL_TABLET | ORAL | 3 refills | Status: DC

## 2023-05-29 NOTE — Progress Notes (Signed)
Cardiology Office Note:    Date:  05/29/2023   ID:  Aaron Schmidt, DOB 11-Apr-1933, MRN [id number]  PCP:  Nadara Eaton, MD  Cardiologist:  Gypsy Balsam, MD    Referring MD: Nadara Eaton, MD   Chief Complaint  Patient presents with   Leg Swelling    History of Present Illness:    Aaron Schmidt is a 89 y.o. male  e with past medical history significant for essential hypertension, chronic left bundle branch block, obstructive sleep apnea on CPAP mask, parking son, in 2020 he came to hospital because of confusion he was found to have elevated troponin cardiac catheterization has been performed which showed normal coronaries, echocardiogram last time done showed moderate concentric LVH and mildly diminished ejection fraction, mild to moderate MR mild MS  Comes today to months for follow-up.  Overall he went through surgery with no difficulties.  His back feels better.  Denies having any chest pain tightness squeezing pressure burning chest.  Some fatigue tiredness he wear some support for his back.  He spent majority of time just sitting in the chair try to walk some.  Swelling of lower extremities problem.  He was put on higher dose of furosemide and up having some kidney dysfunction for the understanding diuretic has been discontinued.  Past Medical History:  Diagnosis Date   Acute kidney injury superimposed on chronic kidney disease (HCC) 09/05/2017   Anemia 09/21/2017   Anemia due to chronic blood loss 01/10/2021   Arthritis    Degeneration spine & stenosis   Benign prostatic hyperplasia 01/10/2021   Benign prostatic hyperplasia with lower urinary tract symptoms 01/10/2021   Body mass index (BMI) 25.0-25.9, adult 08/31/2019   CAD (coronary artery disease) 04/20/2019   Cervical myelopathy with cervical radiculopathy (HCC) 06/24/2014   Cervical spondylosis without myelopathy 05/12/2014   Chronic bilateral low back pain 07/26/2020   Chronic kidney disease, stage 3 unspecified  (HCC) 01/10/2021   Chronic low back pain 07/29/2013   Chronic pain 09/05/2017   Degenerative cervical spinal stenosis 05/26/2014   Degenerative lumbar spinal stenosis 09/22/2019   Displacement of cervical intervertebral disc 07/19/2014   Edema 01/10/2021   Elevated troponin 09/05/2017   Empyema lung (HCC) 09/09/2017   Enlarged prostate 01/10/2021   Essential hypertension 08/29/2010   Qualifier: Diagnosis of  By: Vassie Loll MD, Comer Locket     Fall in home 07/10/2017   Hardening of the aorta (main artery of the heart) (HCC) 01/10/2021   History of kidney stones    Hyperlipidemia 06/21/2014   Hypertension    has a histroy of   Hypoalbuminemia 01/10/2021   Hyponatremia 09/05/2017   Hypotension    Impaired fasting glucose 01/10/2021   Intermittent tremor 01/10/2021   Iron deficiency anemia 01/10/2021   Kidney stone 01/10/2021   LBBB (left bundle branch block) 06/21/2014   Lumbar post-laminectomy syndrome 07/15/2013   Lumbar radiculopathy 07/29/2013   Lumbar stenosis with neurogenic claudication 08/31/2019   Malnutrition of moderate degree 03/23/2019   Mitral valve disorder 01/10/2021   Murmur 01/08/2017   Muscle pain 03/01/2020   Myocardial infarction (HCC) 03/21/2019   Neck pain 03/03/2014   Normochromic normocytic anemia 09/05/2017   NSTEMI (non-ST elevated myocardial infarction) (HCC) 03/21/2019   Obesity 04/06/2019   Obstructive sleep apnea 09/30/2010   PSG (180 lbs ) >> severe obstructive sleep apnea with AHi 38/h & desatns to 82%  Started on auto 5-15  >> changed to 8 cm Download reviewed 1/21 - 11/23/10 >>  residual events of 6/h , avg pr 8 cm, leak + with nasal pillows     Occasional tremors 07/26/2020   Old myocardial infarction 01/10/2021   Osteoarthrosis 01/10/2021   Osteopenia 01/10/2021   Other specified disorders of kidney and ureter 01/10/2021   Parkinson's disease    Peripheral edema 01/10/2021   Peripheral neuralgia 01/10/2021   Personal history of colonic polyps 01/10/2021   Personal history of  other diseases of the respiratory system 01/10/2021   Pleural effusion on left 08/19/2017   Acute symptoms since 08/13/17 > CTa  08/13/17  neg pe/ extensively loculated effusion - L thoracentesis 08/20/2017 :  220 cc with glucose < 20 and WBC 5,338 mostly Poly, LDH 688 > cyt neg >Referrd to T surgery > VATS 09/09/17 c/w empyema   Pleural effusion, left 09/04/2017   Preoperative cardiovascular examination 06/21/2014   Protein-calorie malnutrition, severe 09/11/2017   Pure hypercholesterolemia 01/10/2021   Sciatica 01/10/2021   Scoliosis concern 08/31/2019   Sensorineural hearing loss (SNHL) of both ears 06/15/2020   Last Assessment & Plan:  Formatting of this note might be different from the original. Concern over worsening hearing. Chronic history of sensorineural hearing loss bilaterally.  Has hearing aids currently.  He feels his hearing aids are not working as well as they historically have.  Recently had them checked. EXAM shows normal external canals and tympanic membranes bilaterally. AUDIOGRAM Shows B   Shoulder joint pain 12/01/2013   Sinusitis, chronic 08/19/2017   CT sinus  08/20/2017 >>> Normally aerated paranasal sinuses.  Patent sinus drainage pathways.   SIRS (systemic inflammatory response syndrome) (HCC) 09/05/2017   Skin ulcer of toe of left foot, limited to breakdown of skin (HCC) 09/04/2016   Sleep apnea 2012   used CPAP 2 yrs. ago, feels he sleeps better w/o, no longer using    Spinal stenosis of lumbar region 08/10/2013   Spinal stenosis, lumbar region without neurogenic claudication 08/19/2019   Symptomatic anemia 09/20/2017   Thrombocytopenia (HCC) 01/10/2021   Tubular adenoma of colon 01/10/2021   Unspecified abnormal finding in specimens from other organs, systems and tissues 01/10/2021    Past Surgical History:  Procedure Laterality Date   ANTERIOR LAT LUMBAR FUSION N/A 09/22/2019   Procedure: Lumbar one-two, Lumbar two-three Anterolateral lumbar interbody fusion with  pedicle screw fixation and exploration of adjacent fusion;  Surgeon: Maeola Harman, MD;  Location: [facility] OR;  Service: Neurosurgery;  Laterality: N/A;   ARM NEUROPLASTY     at 14 yrs. of age- fell off tractor- had fracture & repair *& later- 1990's had  transplantation of a nerve at the elbow   BACK SURGERY     x4 back surgery x2 fusion -   BACK SURGERY  11/01/2022   T12-L1 posterior lateral instrumentation and fusion   CARDIAC CATHETERIZATION  03/23/2019   CARPAL TUNNEL RELEASE Right    COLONOSCOPY     ESOPHAGOGASTRODUODENOSCOPY (EGD) WITH PROPOFOL N/A 09/23/2017   Procedure: ESOPHAGOGASTRODUODENOSCOPY (EGD) WITH PROPOFOL;  Surgeon: Carman Ching, MD;  Location: [facility] ENDOSCOPY;  Service: Endoscopy;  Laterality: N/A;   LAMINECTOMY WITH POSTERIOR LATERAL ARTHRODESIS LEVEL 2 N/A 01/31/2023   Procedure: OPEN THORACIC EXTENSION FUSION THORACIC NINE-THORACIC TEN, THORACIC ELEVEN, REMOVAL HARDWARE THORACIC TWELVE WITH CEMENT AUGMENTATION;  Surgeon: Dawley, Alan Mulder, DO;  Location: [facility] OR;  Service: Neurosurgery;  Laterality: N/A;  3C   LEFT HEART CATH AND CORONARY ANGIOGRAPHY N/A 03/23/2019   Procedure: LEFT HEART CATH AND CORONARY ANGIOGRAPHY;  Surgeon: Kathleene Hazel, MD;  Location: [facility] INVASIVE CV LAB;  Service: Cardiovascular;  Laterality: N/A;   LUMBAR PERCUTANEOUS PEDICLE SCREW 2 LEVEL N/A 09/22/2019   Procedure: LUMBAR PERCUTANEOUS PEDICLE SCREW TWO LEVEL;  Surgeon: Maeola Harman, MD;  Location: [facility] OR;  Service: Neurosurgery;  Laterality: N/A;   PLEURAL EFFUSION DRAINAGE Left 09/09/2017   Procedure: DRAINAGE OF PLEURAL EFFUSION;  Surgeon: Loreli Slot, MD;  Location: [facility] OR;  Service: Thoracic;  Laterality: Left;   POSTERIOR CERVICAL FUSION/FORAMINOTOMY N/A 06/24/2014   Procedure: Posterior Cervical Three-Seven Fusion with Lateral Mass Fixation;  Surgeon: Maeola Harman, MD;  Location: [facility] NEURO ORS;  Service: Neurosurgery;  Laterality: N/A;  C3-C7 posterior cervical fusion with lateral  mass fixation   SHOULDER SURGERY Bilateral    VIDEO ASSISTED THORACOSCOPY (VATS)/DECORTICATION Left 09/09/2017   Procedure: VIDEO ASSISTED THORACOSCOPY (VATS)/DECORTICATION;  Surgeon: Loreli Slot, MD;  Location: [facility] OR;  Service: Thoracic;  Laterality: Left;   VIDEO BRONCHOSCOPY  09/09/2017   Procedure: VIDEO BRONCHOSCOPY;  Surgeon: Loreli Slot, MD;  Location: [facility] OR;  Service: Thoracic;;    Current Medications: Current Meds  Medication Sig   Calcium-Magnesium-Zinc (CAL-MAG-ZINC PO) Take 1 tablet by mouth daily.   carbidopa-levodopa (SINEMET IR) 25-100 MG tablet 2 tablets at 8am/2 tablets at noon/2 at 4pm/2 at 7pm (Patient taking differently: Take 2 tablets by mouth See admin instructions. 2 tablets at 8am/2 tablets at noon/2 at 4pm/2 at 7pm)   Carbidopa-Levodopa ER (SINEMET CR) 25-100 MG tablet controlled release Take 1 tablet by mouth at bedtime.   docusate sodium (COLACE) 100 MG capsule Take 100 mg by mouth in the morning.   finasteride (PROSCAR) 5 MG tablet Take 5 mg by mouth in the morning.   fluocinonide cream (LIDEX) 0.05 % Apply 1 application topically daily as needed (itching).   L-LYSINE PO Take 500 mg by mouth in the morning.   lidocaine (LIDODERM) 5 % Place 1 patch onto the skin daily. Remove & Discard patch within 12 hours or as directed by MD   losartan (COZAAR) 25 MG tablet Take 1 tablet (25 mg total) by mouth daily.   magnesium hydroxide (MILK OF MAGNESIA) 400 MG/5ML suspension Take 5 mLs by mouth daily as needed for mild constipation.   methocarbamol (ROBAXIN) 500 MG tablet Take 1 tablet (500 mg total) by mouth every 6 (six) hours as needed for muscle spasms.   mineral oil liquid Take 90 mLs by mouth daily in the afternoon. 6 tablespoons in the afternoon.   Multiple Vitamin (MULTIVITAMIN WITH MINERALS) TABS tablet Take 1 tablet by mouth in the morning. Mature mens 50+   naproxen (NAPROSYN) 500 MG tablet Take 1 tablet (500 mg total) by mouth 2 (two) times  daily.   nortriptyline (PAMELOR) 25 MG capsule Take 25 mg by mouth at bedtime.    oxyCODONE (OXY IR/ROXICODONE) 5 MG immediate release tablet Take 5 mg by mouth every 4 (four) hours as needed.   Polyethyl Glycol-Propyl Glycol (LUBRICANT EYE DROPS) 0.4-0.3 % SOLN Place 1-2 drops into both eyes 3 (three) times daily as needed (dry/irritated eyes.).   pregabalin (LYRICA) 150 MG capsule Take 150 mg by mouth in the morning.   pyridOXINE (VITAMIN B-6) 100 MG tablet Take 100 mg by mouth in the morning.   Sennosides 15 MG TABS Take 45 mg by mouth in the morning. Perdiem Stimulant Laxative Tablets   simvastatin (ZOCOR) 40 MG tablet Take 40 mg by mouth at bedtime.   sodium chloride (OCEAN) 0.65 % SOLN nasal spray Place 1 spray  into both nostrils at bedtime as needed for congestion.   SUPER B COMPLEX/C PO Take 1 capsule by mouth in the morning.   tamsulosin (FLOMAX) 0.4 MG CAPS capsule Take 0.8 mg by mouth daily.   tiZANidine (ZANAFLEX) 2 MG tablet Take 2 mg by mouth every 8 (eight) hours as needed for muscle spasms.   traZODone (DESYREL) 150 MG tablet Take 150 mg by mouth at bedtime.   vitamin B-12 (CYANOCOBALAMIN) 1000 MCG tablet Take 1,000 mcg by mouth in the morning.   vitamin C (ASCORBIC ACID) 500 MG tablet Take 500 mg by mouth in the morning.   [DISCONTINUED] aspirin EC 81 MG tablet Take 1 tablet (81 mg total) by mouth daily.   [DISCONTINUED] Cholecalciferol (VITAMIN D3) 50 MCG (2000 UT) capsule Take 2,000 Units by mouth daily.   [DISCONTINUED] furosemide (LASIX) 20 MG tablet Take 20 mg by mouth every Monday, Wednesday, and Friday. In the morning   [DISCONTINUED] HYDROmorphone (DILAUDID) 2 MG tablet Take 1 tablet (2 mg total) by mouth every 5 (five) hours as needed for moderate pain or severe pain. (Patient taking differently: Take 2 mg by mouth every 4 (four) hours as needed for moderate pain or severe pain.)   [DISCONTINUED] torsemide (DEMADEX) 5 MG tablet Take 5 mg by mouth daily.     Allergies:    Baclofen, Diazepam, and Doxazosin mesylate   Social History   Socioeconomic History   Marital status: Married    Spouse name: Venita Sheffield   Number of children: 0   Years of education: Not on file   Highest education level: Not on file  Occupational History   Occupation: Retired    Comment: Worked for [employer] and retired in 1992  Tobacco Use   Smoking status: Former    Types: Pipe    Quit date: 09/24/1978    Years since quitting: 44.7   Smokeless tobacco: Never  Vaping Use   Vaping status: Never Used  Substance and Sexual Activity   Alcohol use: Not Currently   Drug use: No   Sexual activity: Not on file  Other Topics Concern   Not on file  Social History Narrative   Right Handed    Lives in a one story home - on the 2nd floor with elevators      Had 1 child that passed away   Social Determinants of Health   Financial Resource Strain: Not on file  Food Insecurity: No Food Insecurity (01/31/2023)   Hunger Vital Sign    Worried About Running Out of Food in the Last Year: Never true    Ran Out of Food in the Last Year: Never true  Transportation Needs: No Transportation Needs (01/31/2023)   PRAPARE - Administrator, Civil Service (Medical): No    Lack of Transportation (Non-Medical): No  Physical Activity: Not on file  Stress: Not on file  Social Connections: Not on file     Family History: The patient's family history includes Leukemia in his son; Pancreatic cancer in his mother. ROS:   Please see the history of present illness.    All 14 point review of systems negative except as described per history of present illness  EKGs/Labs/Other Studies Reviewed:         Recent Labs: 01/21/2023: BUN 23; Creatinine, Ser 0.93; Hemoglobin 9.7; Platelets 188; Potassium 3.9; Sodium 135  Recent Lipid Panel    Component Value Date/Time   CHOL 139 03/22/2019 1200   TRIG 72 03/22/2019 1200  HDL 66 03/22/2019 1200   CHOLHDL 2.1 03/22/2019 1200   VLDL 14 03/22/2019 1200    LDLCALC 59 03/22/2019 1200    Physical Exam:    VS:  BP (!) 110/56 (BP Location: Left Arm, Patient Position: Sitting)   Pulse 73   Ht 5\' 10"  (1.778 m)   Wt 165 lb (74.8 kg)   SpO2 93%   BMI 23.68 kg/m     Wt Readings from Last 3 Encounters:  05/29/23 165 lb (74.8 kg)  04/01/23 155 lb (70.3 kg)  03/14/23 155 lb (70.3 kg)     GEN:  Well nourished, well developed in no acute distress HEENT: Normal NECK: No JVD; No carotid bruits LYMPHATICS: No lymphadenopathy CARDIAC: RRR, no murmurs, no rubs, no gallops RESPIRATORY:  Clear to auscultation without rales, wheezing or rhonchi  ABDOMEN: Soft, non-tender, non-distended MUSCULOSKELETAL:  No edema; No deformity  SKIN: Warm and dry LOWER EXTREMITIES: 1+ swelling NEUROLOGIC:  Alert and oriented x 3 PSYCHIATRIC:  Normal affect   ASSESSMENT:    1. Nonrheumatic mitral valve regurgitation   2. LBBB (left bundle branch block)   3. Essential hypertension   4. Coronary artery disease involving native coronary artery of native heart without angina pectoris    PLAN:    In order of problems listed above:  Nonrheumatic mitral valve regurgitation moderate.  Continue monitoring.  The manifestation of it is swelling of lower extremities.  I will put him back on small dose of diuretic 20 mg 3 times a week, will check Chem-7 in about 2 weeks.  I told him that he need to wear elastic stockings those are ordered did not get it yet, I also told him keeping his legs elevated avoidance of salty food. Left bundle branch block chronic, diminished ejection fraction guideline directed medical therapy. Essential hypertension: Blood pressure well-controlled actually on the lower side I will ask him to start taking Flomax at evening time.   Medication Adjustments/Labs and Tests Ordered: Current medicines are reviewed at length with the patient today.  Concerns regarding medicines are outlined above.  No orders of the defined types were placed in this  encounter.  Medication changes: No orders of the defined types were placed in this encounter.   Signed, Georgeanna Lea, MD, [facility] 05/29/2023 2:05 PM    [facility]

## 2023-05-29 NOTE — Patient Instructions (Addendum)
Medication Instructions:   START: Lasix 20mg  1 tablet 3 times a week  TAKE: Flomax in the evening   Lab Work: 3rd Floor   [address]  Your physician recommends that you return for lab work in:   within 2 weeks You need to have labs done when you are fasting.  You can come Monday through Friday 8:00 am to 11:30 AM and 1:00 to 4:00. You do not need to make an appointment as the order has already been placed. The labs you are going to have done are BMP   Testing/Procedures: None Ordered   Follow-Up: At [facility], you and your health needs are our priority.  As part of our continuing mission to provide you with exceptional heart care, we have created designated Provider Care Teams.  These Care Teams include your primary Cardiologist (physician) and Advanced Practice Providers (APPs -  Physician Assistants and Nurse Practitioners) who all work together to provide you with the care you need, when you need it.  We recommend signing up for the patient portal called "MyChart".  Sign up information is provided on this After Visit Summary.  MyChart is used to connect with patients for Virtual Visits (Telemedicine).  Patients are able to view lab/test results, encounter notes, upcoming appointments, etc.  Non-urgent messages can be sent to your provider as well.   To learn more about what you can do with MyChart, go to [email or web address].    Your next appointment:   2 month(s)  The format for your next appointment:   In Person  Provider:   Gypsy Balsam, MD    Other Instructions NA

## 2023-06-15 LAB — BASIC METABOLIC PANEL
BUN/Creatinine Ratio: 20 (ref 10–24)
BUN: 21 mg/dL (ref 8–27)
CO2: 30 mmol/L — ABNORMAL HIGH (ref 20–29)
Calcium: 9.5 mg/dL (ref 8.6–10.2)
Chloride: 101 mmol/L (ref 96–106)
Creatinine, Ser: 1.07 mg/dL (ref 0.76–1.27)
Glucose: 103 mg/dL — ABNORMAL HIGH (ref 70–99)
Potassium: 4.2 mmol/L (ref 3.5–5.2)
Sodium: 142 mmol/L (ref 134–144)
eGFR: 67 mL/min/{1.73_m2} (ref >59)

## 2023-06-20 ENCOUNTER — Telehealth: Payer: Self-pay | Admitting: Cardiology

## 2023-06-20 NOTE — Telephone Encounter (Signed)
Labs sent to PCP per request

## 2023-06-20 NOTE — Telephone Encounter (Signed)
Patient's PCP is requesting patient's most recent labs faxed to their office at:   Fax # [phone]

## 2023-06-21 ENCOUNTER — Telehealth: Payer: Self-pay

## 2023-06-21 NOTE — Telephone Encounter (Signed)
Patient notified of results and verbalized understanding.  

## 2023-06-21 NOTE — Telephone Encounter (Signed)
-----   Message from Gypsy Balsam sent at 06/19/2023 11:01 AM EDT ----- Chem-7 looks good, continue present management

## 2023-07-09 ENCOUNTER — Encounter (HOSPITAL_BASED_OUTPATIENT_CLINIC_OR_DEPARTMENT_OTHER): Payer: Self-pay | Admitting: Urology

## 2023-07-09 ENCOUNTER — Emergency Department (HOSPITAL_BASED_OUTPATIENT_CLINIC_OR_DEPARTMENT_OTHER)
Admission: EM | Admit: 2023-07-09 | Discharge: 2023-07-09 | Disposition: A | Discharge status: Home / Self Care | Payer: Medicare Other | Attending: Emergency Medicine | Admitting: Emergency Medicine

## 2023-07-09 DIAGNOSIS — E860 Dehydration: Secondary | ICD-10-CM | POA: Diagnosis present

## 2023-07-09 DIAGNOSIS — G20C Parkinsonism, unspecified: Secondary | ICD-10-CM | POA: Diagnosis not present

## 2023-07-09 DIAGNOSIS — Z1152 Encounter for screening for COVID-19: Secondary | ICD-10-CM | POA: Diagnosis not present

## 2023-07-09 DIAGNOSIS — R69 Illness, unspecified: Secondary | ICD-10-CM

## 2023-07-09 LAB — COMPREHENSIVE METABOLIC PANEL
ALT: 10 U/L (ref 0–44)
AST: 21 U/L (ref 15–41)
Albumin: 3.8 g/dL (ref 3.5–5.0)
Alkaline Phosphatase: 81 U/L (ref 38–126)
Anion gap: 18 — ABNORMAL HIGH (ref 5–15)
BUN: 29 mg/dL — ABNORMAL HIGH (ref 8–23)
CO2: 28 mmol/L (ref 22–32)
Calcium: 9.7 mg/dL (ref 8.9–10.3)
Chloride: 95 mmol/L — ABNORMAL LOW (ref 98–111)
Creatinine, Ser: 1.41 mg/dL — ABNORMAL HIGH (ref 0.61–1.24)
GFR, Estimated: 48 mL/min — ABNORMAL LOW (ref >60)
Glucose, Bld: 108 mg/dL — ABNORMAL HIGH (ref 70–99)
Potassium: 3.8 mmol/L (ref 3.5–5.1)
Sodium: 141 mmol/L (ref 135–145)
Total Bilirubin: 0.6 mg/dL (ref 0.3–1.2)
Total Protein: 6.1 g/dL — ABNORMAL LOW (ref 6.5–8.1)

## 2023-07-09 LAB — CBC
HCT: 34 % — ABNORMAL LOW (ref 39.0–52.0)
Hemoglobin: 10.7 g/dL — ABNORMAL LOW (ref 13.0–17.0)
MCH: 27.5 pg (ref 26.0–34.0)
MCHC: 31.5 g/dL (ref 30.0–36.0)
MCV: 87.4 fL (ref 80.0–100.0)
Platelets: 161 10*3/uL (ref 150–400)
RBC: 3.89 MIL/uL — ABNORMAL LOW (ref 4.22–5.81)
RDW: 16.2 % — ABNORMAL HIGH (ref 11.5–15.5)
WBC: 5.8 10*3/uL (ref 4.0–10.5)
nRBC: 0 % (ref 0.0–0.2)

## 2023-07-09 LAB — RESP PANEL BY RT-PCR (RSV, FLU A&B, COVID)  RVPGX2
Influenza A by PCR: NEGATIVE
Influenza B by PCR: NEGATIVE
Resp Syncytial Virus by PCR: NEGATIVE
SARS Coronavirus 2 by RT PCR: NEGATIVE

## 2023-07-09 LAB — PHOSPHORUS: Phosphorus: 3.7 mg/dL (ref 2.5–4.6)

## 2023-07-09 LAB — MAGNESIUM: Magnesium: 1.9 mg/dL (ref 1.7–2.4)

## 2023-07-09 MED ORDER — CARBIDOPA-LEVODOPA 25-100 MG PO TABS: 2.0000 | ORAL_TABLET | Freq: Once | ORAL | Status: DC

## 2023-07-09 MED ORDER — LACTATED RINGERS IV BOLUS
500.0000 mL | Freq: Once | INTRAVENOUS | Status: AC
  Administered 2023-07-09: 500 mL via INTRAVENOUS

## 2023-07-09 MED ORDER — OXYCODONE-ACETAMINOPHEN 5-325 MG PO TABS
1.0000 | ORAL_TABLET | Freq: Once | ORAL | Status: AC
  Administered 2023-07-09: 1 via ORAL
  Filled 2023-07-09: qty 1

## 2023-07-09 MED ORDER — METHOCARBAMOL 500 MG PO TABS
500.0000 mg | ORAL_TABLET | Freq: Once | ORAL | Status: AC
  Administered 2023-07-09: 500 mg via ORAL
  Filled 2023-07-09: qty 1

## 2023-07-09 NOTE — ED Notes (Signed)
Pt was able to stand beside bed for approx 5 min before losing balance. Unable to obtain urine sample

## 2023-07-09 NOTE — Discharge Instructions (Signed)
1.  Not take your Lasix for the next 2 days.  Follow-up with your doctor within the next 1 to 2 days to monitor your lab work and see how you are doing.  Increase your hydration at home for the next 24 hours. 2.  Be very careful when standing or moving.  Always have your walker and an assistant if needed. Sit down immediately if you feel lightheaded or dizzy. 3.  Return to the emergency department if you have new worsening or concerning symptoms.

## 2023-07-09 NOTE — ED Triage Notes (Signed)
Pt states at 0945 this am felt like he was paralyzed  States felt flashes and felt unable to move arms and legs  Able to move all arms and legs now    Had mulitple back surgeries in feb and may  Is currently in back brace

## 2023-07-09 NOTE — ED Provider Notes (Signed)
[place] [facility] Provider Note   CSN: [id number] Arrival date & time: 07/09/23  1627     History  Chief Complaint  Patient presents with   Paralysis    Aaron Schmidt is a 89 y.o. male.  HPI Patient's wife reports that the patient was at normal baseline this morning.  She had gotten his breakfast ready for him and he was seated at breakfast.  At baseline the patient ambulates with a walker and wears a back brace due to chronic back pain and recent back surgeries.  He was ambulating at baseline with his walker.  She reports she went out at about 10 AM and patient reports that shortly thereafter he felt that he was completely immobilized.  He reports that he was sitting in his chair but could not move his arms or his legs.  Denies any passed out.  He denies he experienced any pain.  He reports he just felt like he was completely stiff and could not move any parts of his body.  Patient's wife reports she tried to call him a couple times while she was out but he did not answer.  She reports she did not think that was too unusual however because he sleeps a lot of the day and it is also sometimes is slow to get to the phone.  At approximately 4 he used his emergency call and the staff at [facility] came to assess him.  They helped him up to the bathroom and patient's wife came home.  At that time they determined to come to the emergency department.  Patient did not want to come by EMS.  Staff at [facility] helped him into their vehicle and she drove him to the hospital.  Patient reports he was assisted out of the vehicle here.  He reports he can move his arms and legs now with little he still feels kind of stiff and heavy.  Patient reports he has been taking some Lasix for a few weeks due to swelling in his ankles.  He reports he gets swelling in the ankles and is getting harder to get it to go down.  Denies vomiting or diarrhea.  He has not been having any  atypical pain.  He has chronic pain in his back but does not note any change.  No cough, shortness of breath, fever or chills.    Home Medications Prior to Admission medications   Medication Sig Start Date End Date Taking? Authorizing Provider  Calcium-Magnesium-Zinc (CAL-MAG-ZINC PO) Take 1 tablet by mouth daily.    [provider]  carbidopa-levodopa (SINEMET IR) 25-100 MG tablet 2 tablets at 8am/2 tablets at noon/2 at 4pm/2 at 7pm Patient taking differently: Take 2 tablets by mouth See admin instructions. 2 tablets at 8am/2 tablets at noon/2 at 4pm/2 at 7pm 04/30/23   Tat, Rebecca S, DO  Carbidopa-Levodopa ER (SINEMET CR) 25-100 MG tablet controlled release Take 1 tablet by mouth at bedtime. 04/30/23   Tat, Octaviano Batty, DO  docusate sodium (COLACE) 100 MG capsule Take 100 mg by mouth in the morning.    [provider]  finasteride (PROSCAR) 5 MG tablet Take 5 mg by mouth in the morning.    [provider]  fluocinonide cream (LIDEX) 0.05 % Apply 1 application topically daily as needed (itching).    [provider]  furosemide (LASIX) 20 MG tablet Take 1 tablet (20 mg total) by mouth 3 (three) times a week.  05/29/23 08/27/23  Georgeanna Lea, MD  L-LYSINE PO Take 500 mg by mouth in the morning.    [provider]  lidocaine (LIDODERM) 5 % Place 1 patch onto the skin daily. Remove & Discard patch within 12 hours or as directed by MD 04/02/23   Sabas Sous, MD  losartan (COZAAR) 25 MG tablet Take 1 tablet (25 mg total) by mouth daily. 10/31/22   Georgeanna Lea, MD  magnesium hydroxide (MILK OF MAGNESIA) 400 MG/5ML suspension Take 5 mLs by mouth daily as needed for mild constipation.    [provider]  methocarbamol (ROBAXIN) 500 MG tablet Take 1 tablet (500 mg total) by mouth every 6 (six) hours as needed for muscle spasms. 11/07/22   Dawley, Troy C, DO  mineral oil liquid Take 90 mLs by mouth daily in the afternoon. 6 tablespoons in the  afternoon.    [provider]  Multiple Vitamin (MULTIVITAMIN WITH MINERALS) TABS tablet Take 1 tablet by mouth in the morning. Mature mens 50+    [provider]  naproxen (NAPROSYN) 500 MG tablet Take 1 tablet (500 mg total) by mouth 2 (two) times daily. 04/02/23   Sabas Sous, MD  nortriptyline (PAMELOR) 25 MG capsule Take 25 mg by mouth at bedtime.     [provider]  oxyCODONE (OXY IR/ROXICODONE) 5 MG immediate release tablet Take 5 mg by mouth every 4 (four) hours as needed. 05/14/23   [provider]  Polyethyl Glycol-Propyl Glycol (LUBRICANT EYE DROPS) 0.4-0.3 % SOLN Place 1-2 drops into both eyes 3 (three) times daily as needed (dry/irritated eyes.).    [provider]  pregabalin (LYRICA) 150 MG capsule Take 150 mg by mouth in the morning.    [provider]  pyridOXINE (VITAMIN B-6) 100 MG tablet Take 100 mg by mouth in the morning.    [provider]  Sennosides 15 MG TABS Take 45 mg by mouth in the morning. Perdiem Stimulant Laxative Tablets    [provider]  simvastatin (ZOCOR) 40 MG tablet Take 40 mg by mouth at bedtime.    [provider]  sodium chloride (OCEAN) 0.65 % SOLN nasal spray Place 1 spray into both nostrils at bedtime as needed for congestion.    [provider]  SUPER B COMPLEX/C PO Take 1 capsule by mouth in the morning.    [provider]  tamsulosin (FLOMAX) 0.4 MG CAPS capsule Take 0.8 mg by mouth daily.    [provider]  tiZANidine (ZANAFLEX) 2 MG tablet Take 2 mg by mouth every 8 (eight) hours as needed for muscle spasms.    [provider]  traZODone (DESYREL) 150 MG tablet Take 150 mg by mouth at bedtime.    [provider]  vitamin B-12 (CYANOCOBALAMIN) 1000 MCG tablet Take 1,000 mcg by mouth in the morning.    [provider]  vitamin C (ASCORBIC ACID) 500 MG tablet Take 500 mg by mouth in the morning.    [provider]      Allergies    Baclofen, Diazepam, and Doxazosin mesylate    Review of Systems   Review of Systems  Physical Exam Updated Vital Signs BP (!) 144/72   Pulse 83   Temp 97.9 F (36.6 C)   Resp 16   Ht 5\' 10"  (1.778 m)   Wt 74.8 kg   SpO2 97%   BMI 23.66 kg/m  Physical Exam Constitutional:  Comments: Patient is alert.  Nontoxic.  No respiratory distress.  HENT:     Mouth/Throat:     Mouth: Mucous membranes are moist.     Pharynx: Oropharynx is clear.  Eyes:     Extraocular Movements: Extraocular movements intact.  Cardiovascular:     Rate and Rhythm: Normal rate and regular rhythm.  Pulmonary:     Effort: Pulmonary effort is normal.     Breath sounds: Normal breath sounds.  Abdominal:     General: There is no distension.     Palpations: Abdomen is soft.  Musculoskeletal:     Comments: Patient can use both upper extremities to grip the hand rails and pull himself forward to auscultate his lungs.  He can move both lower extremities as well at will.  Slightly slow to move but symmetric.  Patient has about 1-2+ pitting edema at the ankles.  Calves are soft and nontender.  Condition of the lower legs and feet is very good.  No erythema or wounds.  Skin:    General: Skin is warm and dry.  Neurological:     Comments: Patient is alert oriented.  Speech is clear.  No apparent aphasia.  He is slightly slow to move but follows commands appropriately.  He performed grip strength which is symmetric both upper extremities.  He also can extend and flex against resistance.  Patient does seem to have muscular deconditioning but no focal deficit.  He can keep his legs independently elevated and resist some downward pressure.  Psychiatric:        Mood and Affect: Mood normal.     ED Results / Procedures / Treatments   Labs (all labs ordered are listed, but only abnormal results are displayed) Labs Reviewed  COMPREHENSIVE METABOLIC PANEL - Abnormal; Notable for the  following components:      Result Value   Chloride 95 (*)    Glucose, Bld 108 (*)    BUN 29 (*)    Creatinine, Ser 1.41 (*)    Total Protein 6.1 (*)    GFR, Estimated 48 (*)    Anion gap 18 (*)    All other components within normal limits  CBC - Abnormal; Notable for the following components:   RBC 3.89 (*)    Hemoglobin 10.7 (*)    HCT 34.0 (*)    RDW 16.2 (*)    All other components within normal limits  RESP PANEL BY RT-PCR (RSV, FLU A&B, COVID)  RVPGX2  MAGNESIUM  PHOSPHORUS  URINALYSIS, ROUTINE W REFLEX MICROSCOPIC    EKG None  Radiology No results found.  Procedures Procedures    Medications Ordered in ED Medications  carbidopa-levodopa (SINEMET IR) 25-100 MG per tablet immediate release 2 tablet (2 tablets Oral Not Given 07/09/23 1857)  lactated ringers bolus 500 mL (0 mLs Intravenous Stopped 07/09/23 1958)  methocarbamol (ROBAXIN) tablet 500 mg (500 mg Oral Given 07/09/23 1857)  oxyCODONE-acetaminophen (PERCOCET/ROXICET) 5-325 MG per tablet 1 tablet (1 tablet Oral Given 07/09/23 1857)    ED Course/ Medical Decision Making/ A&P                                 Medical Decision Making Amount and/or Complexity of Data Reviewed Labs: ordered.  Risk Prescription drug management.   Patient has complex medical history with significant comorbid illnesses.  Patient has Parkinson's disease with multiple prior thoracic back surgeries and chronic pain.  He describes  an episode of being immobilized earlier today without syncope or focal loss of function.  By history symptoms sound lower probability to be stroke or acute spinal cord related complication.  At this time patient's neurologic exam is nonfocal.  He also does not have pain is different from his baseline pain.  Patient does report recently increasing his use of Lasix for ankle swelling.  Differential diagnosis includes dehydration\electrolyte derangement\adverse medication reaction as patient is on multiple  medications for Parkinson's and pain control.  Will proceed with basic lab work and urinalysis.  White count normal at 5.8.  Hemoglobin stable 10.7.  Normal platelets.  BUN/creatinine 29 and 1.4 which is elevated from patient's baseline.  GFR 48.  Anion gap of 18.  Potassium 3.8.  Magnesium and phosphorus within normal limits.  At this time with elevated GFR and patient recently taking Lasix more consistently, I suspect dehydration.  Will start rehydration with lactated Ringer's.  Patient reports that he is having some increasing spasms of his legs.  He reports this does happen very consistently and none of his medications ever really helped.  However it makes him very uncomfortable.  He reports that he is due for his Parkinson's medications.  I reviewed medications will order patient Sinemet, he has been prescribed Robaxin for muscle spasms will give 1 dose of Robaxin and he also is prescribed Percocet for pain control will give 1 dose of Percocet.  Patient was rehydrated with 500 cc lactated Ringer's.  Blood pressures are stable.  Systolic blood pressure 144/72.  Patient's color looks improved.  At this time he wants to go back to [place] with his wife.  He was very unsteady standing with balance problems.  Patient however advises that he feels that with his walker and some assistance at home that he will be functional.  Patient does have significant comorbid condition of Parkinson's disease plus multiple thoracic surgeries.  At baseline, patient's wife reports that his mobility and activity level is relatively limited but functional for moving around and there home with a walker.  I discussed potential admission for unsteady gait and dehydration.  At this time patient does not wish to be admitted.  He advises he wants to be back at his facility and his wife reports they will return if they are having any problems.        Final Clinical Impression(s) / ED Diagnoses Final diagnoses:  Dehydration   Severe comorbid illness    Rx / DC Orders ED Discharge Orders     None         Arby Barrette, MD 07/09/23 2055

## 2023-07-31 ENCOUNTER — Ambulatory Visit: Payer: Medicare Other | Admitting: Cardiology

## 2023-08-02 ENCOUNTER — Ambulatory Visit: Payer: Medicare Other | Admitting: Adult Health

## 2023-08-25 DEATH — deceased

## 2023-09-26 ENCOUNTER — Ambulatory Visit: Payer: Medicare Other | Admitting: Neurology
